# Patient Record
Sex: Male | Born: 1955
Health system: Southern US, Community
[De-identification: ages and names within clinical notes are randomized; demographics above are authoritative.]

## PROBLEM LIST (undated history)

## (undated) DIAGNOSIS — F419 Anxiety disorder, unspecified: Secondary | ICD-10-CM

## (undated) DIAGNOSIS — C801 Malignant (primary) neoplasm, unspecified: Secondary | ICD-10-CM

## (undated) DIAGNOSIS — I509 Heart failure, unspecified: Secondary | ICD-10-CM

## (undated) DIAGNOSIS — B1920 Unspecified viral hepatitis C without hepatic coma: Secondary | ICD-10-CM

## (undated) DIAGNOSIS — N183 Chronic kidney disease, stage 3 unspecified: Secondary | ICD-10-CM

## (undated) DIAGNOSIS — F329 Major depressive disorder, single episode, unspecified: Secondary | ICD-10-CM

## (undated) DIAGNOSIS — E119 Type 2 diabetes mellitus without complications: Secondary | ICD-10-CM

## (undated) DIAGNOSIS — L409 Psoriasis, unspecified: Secondary | ICD-10-CM

## (undated) DIAGNOSIS — F32A Depression, unspecified: Secondary | ICD-10-CM

## (undated) DIAGNOSIS — I2510 Atherosclerotic heart disease of native coronary artery without angina pectoris: Secondary | ICD-10-CM

## (undated) DIAGNOSIS — I10 Essential (primary) hypertension: Secondary | ICD-10-CM

## (undated) DIAGNOSIS — Z944 Liver transplant status: Secondary | ICD-10-CM

## (undated) HISTORY — PX: UPPER GASTROINTESTINAL ENDOSCOPY: SHX188

## (undated) HISTORY — PX: COLONOSCOPY: SHX174

## (undated) HISTORY — DX: Psoriasis, unspecified: L40.9

## (undated) HISTORY — DX: Liver transplant status: Z94.4

## (undated) HISTORY — PX: LIVER BIOPSY: SHX301

## (undated) HISTORY — DX: Essential (primary) hypertension: I10

## (undated) HISTORY — PX: LIVER SURGERY: SHX698

## (undated) HISTORY — DX: Unspecified viral hepatitis C without hepatic coma: B19.20

## (undated) HISTORY — PX: HERNIA REPAIR: SHX51

---

## 2003-08-19 ENCOUNTER — Other Ambulatory Visit: Admission: RE | Admit: 2003-08-19 | Discharge: 2003-08-19 | Payer: Self-pay | Admitting: Dermatology

## 2006-03-07 ENCOUNTER — Ambulatory Visit (HOSPITAL_COMMUNITY): Admission: RE | Admit: 2006-03-07 | Discharge: 2006-03-07 | Payer: Self-pay | Admitting: Internal Medicine

## 2006-03-07 ENCOUNTER — Encounter (INDEPENDENT_AMBULATORY_CARE_PROVIDER_SITE_OTHER): Payer: Self-pay | Admitting: Specialist

## 2006-05-03 ENCOUNTER — Ambulatory Visit: Payer: Self-pay | Admitting: Internal Medicine

## 2006-05-25 ENCOUNTER — Ambulatory Visit: Payer: Self-pay | Admitting: Internal Medicine

## 2006-05-25 ENCOUNTER — Encounter (INDEPENDENT_AMBULATORY_CARE_PROVIDER_SITE_OTHER): Payer: Self-pay | Admitting: Specialist

## 2006-05-25 ENCOUNTER — Ambulatory Visit (HOSPITAL_COMMUNITY): Admission: RE | Admit: 2006-05-25 | Discharge: 2006-05-25 | Payer: Self-pay | Admitting: Internal Medicine

## 2006-07-19 ENCOUNTER — Ambulatory Visit: Payer: Self-pay | Admitting: Internal Medicine

## 2006-10-30 ENCOUNTER — Ambulatory Visit: Payer: Self-pay | Admitting: Internal Medicine

## 2007-06-06 HISTORY — PX: LIVER SURGERY: SHX698

## 2007-10-01 DIAGNOSIS — B182 Chronic viral hepatitis C: Secondary | ICD-10-CM | POA: Insufficient documentation

## 2007-10-01 DIAGNOSIS — L409 Psoriasis, unspecified: Secondary | ICD-10-CM | POA: Insufficient documentation

## 2007-10-29 DIAGNOSIS — I8500 Esophageal varices without bleeding: Secondary | ICD-10-CM | POA: Insufficient documentation

## 2008-01-27 DIAGNOSIS — Z944 Liver transplant status: Secondary | ICD-10-CM | POA: Insufficient documentation

## 2008-07-22 ENCOUNTER — Ambulatory Visit (HOSPITAL_COMMUNITY): Payer: Self-pay | Admitting: Psychology

## 2008-07-28 DIAGNOSIS — F3289 Other specified depressive episodes: Secondary | ICD-10-CM | POA: Insufficient documentation

## 2008-08-05 ENCOUNTER — Ambulatory Visit (HOSPITAL_COMMUNITY): Payer: Self-pay | Admitting: Psychology

## 2010-10-18 NOTE — Assessment & Plan Note (Signed)
NAME:  Joseph Greer, Joseph Greer           CHART#:  [id number]   DATE:  10/30/2006                       DOB:  Dec 14, 1955   PRESENTING COMPLAINT:  Extreme weakness, fatigue.  Lower extremity  edema.   SUBJECTIVE:  Joseph Greer is a 55 year old Caucasian male with a history of  cirrhosis secondary to hepatitis C who is here for scheduled visit.  Antiviral therapy has been deferred because of severe psoriasis and risk  of getting a skin disorder.  He has been evaluated at [facility] as  a potential candidate for OLT.  Presently getting periodic alcohol and  drug levels.  He also had an MRI, results of which are not known.  Patient states that he has not been feeling well for the last 1 month.  LE edema started about a year ago, and he has been having this off and  on.  He recalls he was admitted to [facility] in December 2008.  On his visit  to our office back in November 2007 he did not have lower extremity  edema, and he weighed 190 pounds.  More recently he was seen on  07/19/2006, and he weighed 194 pounds, and he had a macular papular rash  involving both of his lower extremities.  He did not have any pitting  edema.  He has been on a low-salt diet.  His wife states that she does  not put much salt in food while she is cooking.  He also complains of  feeling tired all the time and feels very weak.  He gets tired when he  starts to cut his grass.  Previously, he was able to accomplish this  without having to stop.  He denies shortness of breath, chest pain.  He  states his appetite is normal, but does have intermittent nonbloody  diarrhea usually after meals.  He denies vomiting or melena.  Over the  last 2 days he has had sore throat and runny nose but no fever.  He has  been on pen VK.  Prior to that he was on cephalexin.   He is on:  1. Enbrel 50 mg q. weekly.  He decided to skip the dose this week      because of his URI.  2. Propranolol 20 mg daily.  3. Ambien 5 mg nightly p.r.n. or  hydroxyzine 25 mg nightly p.r.n.  4. Betazole cream 0.05% to skin rash daily p.r.n.  5. Lotrel 10/20 daily.  6. Dovonex 0.005% cream b.i.d. to rash.  7. Pen VK 500 mg q.i.d.  8. Astelin nasal spray daily.   PAST MEDICAL HISTORY:  He has hypertension, psoriasis, chronic insomnia,  hepatitis C resulting in cirrhosis yet he has not had any alcohol in  over 6 months.  He also has a history of a herniated disk in his neck  with some neuro deficit in his fingers.  He is on a beta blocker for  primary prophylaxis for a variceal bleed.  He had EGD done last year by  Dr. Lennie Odor.  A history of colonic polyps.  His last colonoscopy was in  December 2007.  Both of the polyps that were removed were hyperplastic,  but on prior incomplete colonoscopy by Dr. Lennie Odor he had some adenomas  removed.   He has genotype I chronic hepatitis B.  An EGD of 03/2006 revealed 2  columns  of grade II esophageal varices and mild portal gastropathy.   The patient has been evaluated by Dr. Eddie Dibbles __________ at [facility] and is  getting a periodic blood work as recommended by them.   His liver biopsy was in October 2007.  He has inflammatory grade II and  fibrosis stage IV.   ALLERGIES:  NK.   OBJECTIVE:  VITAL SIGNS:  Weight 181 pounds.  He is 6 feet tall.  Pulse  80 per minute, blood pressure 118/78, temp is 98.6.  GENERAL:  He is alert and does not have asterixis.  HEENT:  Conjunctivae are pink.  Sclerae are anicteric.  Oropharyngeal  mucosa is normal.  NECK:  No neck masses, thyromegaly or spider angiomata noted.  CARDIAC:  Regular rhythm.  Normal S1 and S2.  No murmur or gallop noted.  RESPIRATORY:  Lungs are clear to auscultation.  GASTROINTESTINAL:  His abdomen is symmetrical and soft.  Liver edges  easily palpable 3 cm to 4 cm below RCM, and inspiration is firm and  minimally tender.  Spleen tip is also palpable.  Shifting dullness is  absent.  SKIN/EXTREMITIES:  He has trace to 1+ pitting edema involving his  legs.  He has a macular papular scaly rash involving his hands as well as upper  extremities.   ASSESSMENT:  1. Lower extremity edema.  This is concerning for hypoalbuminemia      given his chronic liver disease.  Clinically, he does not appear to      have ascites.  His albumin in February 2008 was 2.6.  Needs to be      repeated.  2. Weakness and fatigue.  I suspect this is multifactorial, re liver      disease, beta blocker.  Need to rule out anemia, hypokalemia.   PLAN:  1. Will hold off propranolol for the time being.  2. He will have CBC and chem-20.  3. Spironolactone 50 mg daily.  4. Lasix 20 mg 3 times a week as needed.  He had a prescription given      for spironolactone for 30 with 5 refills and for Lasix 20 with 2      refills.  5. Patient advised to continue periodic blood work as recommended by      Dr. __________ of [facility].  6. He will return for OV in 8 weeks.       Hildred Laser, M.D.  Electronically Signed     NR/MEDQ  D:  10/30/2006  T:  10/31/2006  Job:  [id number]

## 2010-10-21 NOTE — Op Note (Signed)
NAMECUTHBERT, Joseph Greer          ACCOUNT NO.:  [id number]   MEDICAL RECORD NO.:  [id number]          PATIENT TYPE:  AMB   LOCATION:  DAY                           FACILITY:  [facility]   PHYSICIAN:  Hildred Laser, M.D.    DATE OF BIRTH:  1956-01-21   DATE OF PROCEDURE:  05/25/2006  DATE OF DISCHARGE:                               OPERATIVE REPORT   PROCEDURE:  Colonoscopy with polypectomy.   INDICATION:  Joseph Greer is a 55 year old Caucasian male with multiple medical  problems who has had two colonoscopies [place] which were incomplete but he  had some adenomas removed.  He is undergoing repeat colonoscopy hoping  to do a complete exam today and remove any more polyps that he might  have.  He has biopsy proven hepatitis C cirrhosis.  He has  thrombocytopenia, his INR was 1.2.  The procedure risks were reviewed  with the patient and informed consent was obtained.   MEDS FOR CONSCIOUS SEDATION:  Demerol 50 mg IV, Versed 10 mg IV in  divided dose.   FINDINGS:  The procedure was performed in the endoscopy suite.  The  patient's vital signs and O2 sat were monitored during the procedure and  remained stable.  The patient was placed in the left lateral decubitus  position.  Rectal examination was performed.  No abnormality noted on  external or digital exam.  The Pentax videoscope was placed in the  rectum and advanced under vision into the sigmoid colon which was  tortuous and loopy.  By using abdominal pressure, repeatedly withdrawing  the scope, and changing patient's position, I was able to pass the scope  beyond splenic flexure and further intubation into the cecum was rather  easy.  The cecum was identified by the ileocecal valve.  There was a 10  mm polyp next to the appendiceal orifice.  This was snared and  polypectomy was felt to be complete.  This polyp broke up into pieces  that was retrieved.  The mucosa of the rest of the colon was carefully  examined as the scope was withdrawn.   There was a small submucosal  lipoma at the proximal transverse colon which was left alone.  There  were two polyps at the mid sigmoid colon very close to each other, the  smaller one was about 5 mm and the other one was 8 mm, both of these  were snared and submitted in one container.  The mucosa of the rest of  the colon was normal.  The rectal mucosa, similarly, was normal.  The  scope was retroflexed to examine the anorectal junction which was  unremarkable.  The endoscope was straightened and withdrawn.  The  patient tolerated the procedure well.   FINAL DIAGNOSIS:  1. Examination performed to the cecum.  2. 10 mm polyp snared from cecum close to the appendiceal orifice.  3. Two other polyps were snared from the sigmoid colon.  These were 5      and 8 mm in diameter.   RECOMMENDATIONS:  Standard instructions given.  I will be contacting the  patient with biopsy results.  He will  return after the holidays to  initiate antiviral therapy for hepatitis C.      Hildred Laser, M.D.  Electronically Signed     NR/MEDQ  D:  05/25/2006  T:  05/26/2006  Job:  [id number]   cc:   Delphina Cahill, M.D.  Fax: [phone]

## 2010-10-21 NOTE — H&P (Signed)
NAME:  Joseph Greer, Joseph Greer          ACCOUNT NO.:  [id number]   MEDICAL RECORD NO.:  [id number]            PATIENT TYPE:   LOCATION:                                 FACILITY:   PHYSICIAN:  Hildred Laser, M.D.    DATE OF BIRTH:  02/24/1936   DATE OF ADMISSION:  DATE OF DISCHARGE:  LH                              HISTORY & PHYSICAL   PRESENTING COMPLAINT:  Multiple polyps found on recent colonoscopy which  was incomplete.  Biopsy-proven hep C cirrhosis.  Patient interested in  antiviral therapy.   HISTORY OF PRESENT ILLNESS:  Joseph Greer is a 55 year old Caucasian male who is  self-referred for GI evaluation.  He has been under care of Dr. Leonard Schwartz of [place], [place], who is leaving next month, and the  patient wanted to continue his GI care through this office.  As far as  the patient's hep C is concerned, he has been aware that his  transaminases have been elevated since he was 55 years old.  He was  diagnosed with hepatitis 13 or 14 years ago.  However, he has not been  fully evaluated until recently.  He had studies by Dr. Lavone Neri.  His  genotype 1b count was 6.799 million.  He has had thrombocytopenia as  well as low serum albumin.  He had liver biopsy on April 03, 2006 at  [facility].  He was noted to have chronic hepatitis with cirrhosis with mild  activity inflammatory grade 2, but fibrosis was stage IV.  He had  intralobular and perinodular inflammation, micro- and macrovesicular  steatosis.  There was no evidence of increased iron stores.  He also had  esophagogastroduodenoscopy by Dr. Lavone Neri on March 15, 2006 and noted to  have two columns of grade II esophageal varices, portal gastropathy.  His CLO-test was negative, and he was begun on propranolol for primary  prevention of variceal bleeds.   The patient was also found to have heme-positive stools and therefore  underwent colonoscopy.  However, his prep was poor, and he returned for  follow-up exam on April 10, 2006.  It  was a difficult exam.  Five  polyps were removed.  However, proximal colon was not examined.  He had  to be temporarily kept in ICU for over sedation but had uneventful  recovery.  Follow-up colonoscopy was recommended at some point.  The  patient denies melena, rectal bleeding or abdominal pain.  He states his  appetite is not normal, but he has not lost any weight.  He is  complaining of worsening rash and itching secondary to psoriasis since  his Amaryl was discontinued.  The patient was advised by Dr. Lavone Neri to  quit drinking alcohol, but he tells me that he drinks a few beers over  the weekend.  He denies heartburn, dysphagia, fever or chills.   He is on:  1. Enbrel 50 mg subcu or IM weekly.  2. Ultravate cream to rash b.i.d. p.r.n.  3. Propranolol 20 mg daily.  4. Valium 5 mg every night.  5. Ambien 5 mg every night p.r.n.  6. Mucinex b.i.d.  PAST MEDICAL HISTORY:  He has psoriasis which was diagnosed about 12  years ago.  His been quite responsive to Enbrel which has been  temporarily discontinued.  History of hep C as above.  Hepatitis C is  felt to be due to remote IV drug abuse.   He has been hypertensive for about a year.  He has chronic insomnia.  He  has neck pain secondary to herniated disk.  He has been under care of a  chiropractor, but he is scheduled to be seen by a neurosurgeon.  History  of colonic polyps as above.  History of microscopic hematuria to be  evaluated by urologist.  He had tonsillectomy at age 31.   ALLERGIES:  NK.   FAMILY HISTORY:  Both parents have heart disease.  There are 50 years  old and doing fair.  He does not have any siblings.   SOCIAL HISTORY:  He is married.  He has one son, age 77.  His wife is  also having back problems.  He worked at [employer] in [place] for [place] years but  presently working at [employer] where he has been for about 4  years.  He has been smoking half pack a day for 30 years.  He states he  drinks alcohol  usually over the weekends.   PHYSICAL EXAMINATION:  Well-developed, well-nourished Caucasian male who  is in no acute distress.  He weighs 190 pounds.  He is 6 feet tall.  Pulse 68 per minute, blood pressure 152/80, temperature is 98.5.  HEENT:  Conjunctivae is pink.  Sclera is nonicteric.  Oral pharyngeal  mucosa is normal.  No neck masses or thyromegaly noted.  He had a few  spider angiomata over his chest and neck.  CARDIAC EXAM:  With regular rhythm.  Normal S1-S2.  No murmur or gallop  noted.  LUNGS:  Clear to auscultation.  ABDOMEN:  Is flat.  Bowel sounds are normal.  Palpation is soft with  very easily palpable left lobe of the liver which is firm.  Right lobe  is 4 cm below RCM at end inspiration.  Spleen is not palpable.  He does  not have shifting dullness.  RECTAL:  Examination deferred.  He does not have peripheral edema or clubbing.   LABORATORY DATA:  From Dr. Rudene Re office February 19, 2006:  PT 14.1,  INR 1.2.  Iron 202, TIBC 298, saturation 68%.  PTT was 37.6.  Bleeding  time was 11 minutes.  His ferritin was 452.  He had positive hep C  genotype 1b at 6.7 million units.  CBC from April 10, 2006:  WBC 4.7,  H&H is 12.7 and 39.5, platelet count 65, MCV is 98.8.  Bilirubin 1.4, AP  80, AST 80, ALT 50, total protein 6.8 with albumin of 2.4, BUN 5,  creatinine 0.7, glucose 151, sodium 139, potassium 4.1, chloride 104,  CO2 31, and his INR was 1.3, PTT 43.7.  Urinalysis had too numerous to  count WBCs and 10 to 20 RBCs.   Histology on the colonic polyps not available.   ASSESSMENT:  Joseph Greer is 55 year old Caucasian male with the following GI  problems.  1. Chronic hep C with biopsy-proven cirrhosis.  He has stage 2      inflammatory grade and stage IV cirrhosis.  Although no Mallory      bodies were seen, he did have macro- and microvesicular fats.  His     AST has been repeatedly higher than his  ALT.  I suspect he has      super-added hepatic injury due to alcohol  consumption.  I am afraid      that he is not telling me his actual alcohol consumption.  If this      is the case, his hepatic function should improve with alcohol      abstinence, and he may be able to tolerate full-dose antiviral      therapy.  Since he has evidence of portal hypertension, i.e.      esophageal varices, he may eventually require liver transplant.  It      would be desirable to eradicate the infection if at all possible.      His chance of response to therapy if he is able to tolerate it      would be less than 50%.  2. Colonic polyps and recent colonoscopy was which was incomplete.  I      agree his colon exam needs to be repeated, and significant      pathology needs to be ruled out prior to initiating antiviral      therapy.   RECOMMENDATIONS:  His Enbrel can be resumed for his psoriasis.   We will get all of the pertinent data from [facility] including the results of  imaging studies, etc.   He will need to be vaccinated for hepatitis B unless he has a positive  hepatitis B surface antibody.   The patient advised very strongly to quit drinking alcohol.  I told him  if he was not able to do that I would not treat his viral hepatitis.   We will plan a colonoscopy as soon as feasible and then discuss  antiviral therapy in detail, and if he is agreeable, hopefully we can  get him started next month.   I also recommended that his wife be tested for HCV antibody.  Finally,  he would also need alpha fetoprotein unless he has had this test via  [facility].      Hildred Laser, M.D.  Electronically Signed     NR/MEDQ  D:  05/03/2006  T:  05/04/2006  Job:  [id number]   cc:   Leonard Schwartz, M.D.  Cain Sieve, M.D.  Fax: [facility]  Fax: [phone]

## 2010-11-07 ENCOUNTER — Ambulatory Visit (INDEPENDENT_AMBULATORY_CARE_PROVIDER_SITE_OTHER): Payer: Self-pay | Admitting: Internal Medicine

## 2010-11-28 ENCOUNTER — Ambulatory Visit (INDEPENDENT_AMBULATORY_CARE_PROVIDER_SITE_OTHER): Payer: Self-pay | Admitting: Internal Medicine

## 2010-11-28 DIAGNOSIS — Z860100 Personal history of colon polyps, unspecified: Secondary | ICD-10-CM

## 2010-11-28 DIAGNOSIS — R5383 Other fatigue: Secondary | ICD-10-CM

## 2010-11-28 DIAGNOSIS — R5381 Other malaise: Secondary | ICD-10-CM

## 2010-11-28 DIAGNOSIS — Z944 Liver transplant status: Secondary | ICD-10-CM

## 2011-03-16 ENCOUNTER — Telehealth (INDEPENDENT_AMBULATORY_CARE_PROVIDER_SITE_OTHER): Payer: Self-pay | Admitting: *Deleted

## 2011-03-16 DIAGNOSIS — E875 Hyperkalemia: Secondary | ICD-10-CM

## 2011-03-16 NOTE — Telephone Encounter (Signed)
Per Dr. Laural Golden  Repeat Potassium in 1 week ,( 03-23-11).

## 2011-08-03 ENCOUNTER — Encounter (INDEPENDENT_AMBULATORY_CARE_PROVIDER_SITE_OTHER): Payer: Self-pay | Admitting: *Deleted

## 2011-09-04 ENCOUNTER — Encounter (INDEPENDENT_AMBULATORY_CARE_PROVIDER_SITE_OTHER): Payer: Self-pay | Admitting: Internal Medicine

## 2011-09-04 ENCOUNTER — Ambulatory Visit (INDEPENDENT_AMBULATORY_CARE_PROVIDER_SITE_OTHER): Payer: Medicare Other | Admitting: Internal Medicine

## 2011-09-04 VITALS — BP 138/80 | HR 84 | Temp 97.6°F | Resp 20 | Ht 72.0 in | Wt 202.7 lb

## 2011-09-04 DIAGNOSIS — L409 Psoriasis, unspecified: Secondary | ICD-10-CM | POA: Insufficient documentation

## 2011-09-04 DIAGNOSIS — R42 Dizziness and giddiness: Secondary | ICD-10-CM | POA: Insufficient documentation

## 2011-09-04 DIAGNOSIS — Z944 Liver transplant status: Secondary | ICD-10-CM
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
## 2011-09-04 NOTE — Patient Instructions (Signed)
No changes made in your medications today
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
## 2011-10-03 DIAGNOSIS — H81399 Other peripheral vertigo, unspecified ear: Secondary | ICD-10-CM | POA: Insufficient documentation

## 2011-11-02 ENCOUNTER — Encounter (INDEPENDENT_AMBULATORY_CARE_PROVIDER_SITE_OTHER): Payer: Self-pay

## 2012-01-24 DIAGNOSIS — E119 Type 2 diabetes mellitus without complications: Secondary | ICD-10-CM | POA: Insufficient documentation

## 2012-01-24 DIAGNOSIS — I10 Essential (primary) hypertension: Secondary | ICD-10-CM | POA: Diagnosis present

## 2012-01-24 DIAGNOSIS — C44320 Squamous cell carcinoma of skin of unspecified parts of face: Secondary | ICD-10-CM | POA: Insufficient documentation

## 2012-01-30 ENCOUNTER — Encounter (INDEPENDENT_AMBULATORY_CARE_PROVIDER_SITE_OTHER): Payer: Self-pay

## 2012-03-05 ENCOUNTER — Ambulatory Visit (INDEPENDENT_AMBULATORY_CARE_PROVIDER_SITE_OTHER): Payer: Medicare Other | Admitting: Internal Medicine

## 2012-04-17 DIAGNOSIS — Z9889 Other specified postprocedural states: Secondary | ICD-10-CM | POA: Insufficient documentation

## 2012-08-05 DIAGNOSIS — R5383 Other fatigue: Secondary | ICD-10-CM | POA: Insufficient documentation

## 2013-03-26 ENCOUNTER — Observation Stay (HOSPITAL_COMMUNITY)
Admission: EM | Admit: 2013-03-26 | Discharge: 2013-03-27 | Disposition: A | Discharge status: Home / Self Care | Payer: Medicare Other | Attending: Internal Medicine | Admitting: Internal Medicine

## 2013-03-26 ENCOUNTER — Encounter (HOSPITAL_COMMUNITY): Payer: Self-pay | Admitting: *Deleted

## 2013-03-26 ENCOUNTER — Emergency Department (HOSPITAL_COMMUNITY): Payer: Medicare Other

## 2013-03-26 DIAGNOSIS — I10 Essential (primary) hypertension: Secondary | ICD-10-CM | POA: Diagnosis present

## 2013-03-26 DIAGNOSIS — L408 Other psoriasis: Secondary | ICD-10-CM | POA: Insufficient documentation

## 2013-03-26 DIAGNOSIS — R0789 Other chest pain: Principal | ICD-10-CM | POA: Insufficient documentation

## 2013-03-26 DIAGNOSIS — Z9889 Other specified postprocedural states: Secondary | ICD-10-CM | POA: Insufficient documentation

## 2013-03-26 DIAGNOSIS — R079 Chest pain, unspecified: Secondary | ICD-10-CM | POA: Diagnosis present

## 2013-03-26 DIAGNOSIS — R42 Dizziness and giddiness: Secondary | ICD-10-CM | POA: Insufficient documentation

## 2013-03-26 DIAGNOSIS — Z79899 Other long term (current) drug therapy: Secondary | ICD-10-CM | POA: Insufficient documentation

## 2013-03-26 DIAGNOSIS — Z944 Liver transplant status: Secondary | ICD-10-CM

## 2013-03-26 DIAGNOSIS — Z23 Encounter for immunization: Secondary | ICD-10-CM | POA: Insufficient documentation

## 2013-03-26 DIAGNOSIS — Z87891 Personal history of nicotine dependence: Secondary | ICD-10-CM | POA: Insufficient documentation

## 2013-03-26 DIAGNOSIS — L409 Psoriasis, unspecified: Secondary | ICD-10-CM

## 2013-03-26 LAB — CBC WITH DIFFERENTIAL/PLATELET
Eosinophils Relative: 4 % (ref 0–5)
HCT: 39 % (ref 39.0–52.0)
Hemoglobin: 13.3 g/dL (ref 13.0–17.0)
Lymphocytes Relative: 27 % (ref 12–46)
Lymphs Abs: 1.6 10*3/uL (ref 0.7–4.0)
MCV: 82.3 fL (ref 78.0–100.0)
Monocytes Absolute: 0.5 10*3/uL (ref 0.1–1.0)
Monocytes Relative: 8 % (ref 3–12)
Neutro Abs: 3.5 10*3/uL (ref 1.7–7.7)
WBC: 5.9 10*3/uL (ref 4.0–10.5)

## 2013-03-26 LAB — COMPREHENSIVE METABOLIC PANEL
ALT: 51 U/L (ref 0–53)
ALT: 47 U/L (ref 0–53)
AST: 92 U/L — ABNORMAL HIGH (ref 0–37)
AST: 81 U/L — ABNORMAL HIGH (ref 0–37)
Alkaline Phosphatase: 140 U/L — ABNORMAL HIGH (ref 39–117)
CO2: 26 mEq/L (ref 19–32)
CO2: 28 mEq/L (ref 19–32)
Chloride: 106 mEq/L (ref 96–112)
Chloride: 106 mEq/L (ref 96–112)
Creatinine, Ser: 1.04 mg/dL (ref 0.50–1.35)
GFR calc Af Amer: >90 mL/min (ref >90)
GFR calc non Af Amer: 78 mL/min — ABNORMAL LOW (ref >90)
GFR calc non Af Amer: 78 mL/min — ABNORMAL LOW (ref >90)
Glucose, Bld: 109 mg/dL — ABNORMAL HIGH (ref 70–99)
Potassium: 4.3 mEq/L (ref 3.5–5.1)
Potassium: 4.1 mEq/L (ref 3.5–5.1)
Sodium: 140 mEq/L (ref 135–145)
Total Bilirubin: 0.6 mg/dL (ref 0.3–1.2)
Total Bilirubin: 0.5 mg/dL (ref 0.3–1.2)

## 2013-03-26 LAB — CBC
Hemoglobin: 13.3 g/dL (ref 13.0–17.0)
Platelets: 204 10*3/uL (ref 150–400)
RBC: 4.73 MIL/uL (ref 4.22–5.81)
WBC: 6.9 10*3/uL (ref 4.0–10.5)

## 2013-03-26 LAB — MAGNESIUM: Magnesium: 1.8 mg/dL (ref 1.5–2.5)

## 2013-03-26 LAB — LIPID PANEL
Cholesterol: 183 mg/dL (ref 0–200)
LDL Cholesterol: 72 mg/dL (ref 0–99)
Triglycerides: 373 mg/dL — ABNORMAL HIGH (ref <150)

## 2013-03-26 LAB — PROTIME-INR: INR: 0.94 (ref 0.00–1.49)

## 2013-03-26 LAB — PRO B NATRIURETIC PEPTIDE: Pro B Natriuretic peptide (BNP): 189.6 pg/mL — ABNORMAL HIGH (ref 0–125)

## 2013-03-26 LAB — POCT I-STAT TROPONIN I

## 2013-03-26 MED ORDER — NITROGLYCERIN 0.4 MG SL SUBL: 0.4000 mg | SUBLINGUAL_TABLET | SUBLINGUAL | Status: DC | PRN

## 2013-03-26 MED ORDER — BUPROPION HCL 75 MG PO TABS
75.0000 mg | ORAL_TABLET | Freq: Two times a day (BID) | ORAL | Status: DC
  Administered 2013-03-26 – 2013-03-27 (×2): 75 mg via ORAL
  Filled 2013-03-26 (×3): qty 1

## 2013-03-26 MED ORDER — ENOXAPARIN SODIUM 40 MG/0.4ML ~~LOC~~ SOLN
40.0000 mg | SUBCUTANEOUS | Status: DC
  Filled 2013-03-26 (×2): qty 0.4

## 2013-03-26 MED ORDER — CALCIUM CARBONATE 600 MG PO TABS
600.0000 mg | ORAL_TABLET | Freq: Two times a day (BID) | ORAL | Status: DC
  Filled 2013-03-26: qty 1

## 2013-03-26 MED ORDER — ZOLPIDEM TARTRATE 5 MG PO TABS
5.0000 mg | ORAL_TABLET | Freq: Once | ORAL | Status: AC
  Administered 2013-03-26: 5 mg via ORAL
  Filled 2013-03-26: qty 1

## 2013-03-26 MED ORDER — CALCIUM CARBONATE 1250 (500 CA) MG PO TABS
1.0000 | ORAL_TABLET | Freq: Two times a day (BID) | ORAL | Status: DC
  Administered 2013-03-27: 500 mg via ORAL
  Filled 2013-03-26 (×3): qty 1

## 2013-03-26 MED ORDER — ASPIRIN EC 81 MG PO TBEC
81.0000 mg | DELAYED_RELEASE_TABLET | Freq: Every day | ORAL | Status: DC
  Administered 2013-03-27: 81 mg via ORAL
  Filled 2013-03-26: qty 1

## 2013-03-26 MED ORDER — TACROLIMUS 1 MG PO CAPS: 1.0000 mg | ORAL_CAPSULE | Freq: Two times a day (BID) | ORAL | Status: DC

## 2013-03-26 MED ORDER — FLUOXETINE HCL 20 MG PO CAPS
20.0000 mg | ORAL_CAPSULE | Freq: Every day | ORAL | Status: DC
  Administered 2013-03-27: 20 mg via ORAL
  Filled 2013-03-26: qty 1

## 2013-03-26 MED ORDER — IBUPROFEN 800 MG PO TABS
800.0000 mg | ORAL_TABLET | Freq: Four times a day (QID) | ORAL | Status: DC | PRN
Start: 2013-03-26 — End: 2013-03-26

## 2013-03-26 MED ORDER — ACETAMINOPHEN 325 MG PO TABS: 650.0000 mg | ORAL_TABLET | ORAL | Status: DC | PRN

## 2013-03-26 MED ORDER — ONDANSETRON HCL 4 MG PO TABS: 8.0000 mg | ORAL_TABLET | ORAL | Status: DC | PRN

## 2013-03-26 MED ORDER — MYCOPHENOLATE MOFETIL 250 MG PO CAPS
500.0000 mg | ORAL_CAPSULE | Freq: Two times a day (BID) | ORAL | Status: DC
  Administered 2013-03-26 – 2013-03-27 (×2): 500 mg via ORAL
  Filled 2013-03-26 (×3): qty 2

## 2013-03-26 MED ORDER — TACROLIMUS 1 MG PO CAPS
2.0000 mg | ORAL_CAPSULE | Freq: Every day | ORAL | Status: DC
  Administered 2013-03-27: 2 mg via ORAL
  Filled 2013-03-26: qty 2

## 2013-03-26 MED ORDER — ASPIRIN 81 MG PO CHEW: 324.0000 mg | CHEWABLE_TABLET | Freq: Once | ORAL | Status: AC

## 2013-03-26 MED ORDER — METOPROLOL TARTRATE 12.5 MG HALF TABLET
12.5000 mg | ORAL_TABLET | Freq: Two times a day (BID) | ORAL | Status: DC
  Administered 2013-03-26 – 2013-03-27 (×2): 12.5 mg via ORAL
  Filled 2013-03-26 (×3): qty 1

## 2013-03-26 MED ORDER — LISINOPRIL 10 MG PO TABS
10.0000 mg | ORAL_TABLET | Freq: Every day | ORAL | Status: DC
  Administered 2013-03-27: 10 mg via ORAL
  Filled 2013-03-26: qty 1

## 2013-03-26 MED ORDER — INFLUENZA VAC SPLIT QUAD 0.5 ML IM SUSP
0.5000 mL | INTRAMUSCULAR | Status: AC
  Administered 2013-03-27: 0.5 mL via INTRAMUSCULAR
  Filled 2013-03-26: qty 0.5

## 2013-03-26 MED ORDER — MYCOPHENOLATE MOFETIL 250 MG PO CAPS: 250.0000 mg | ORAL_CAPSULE | Freq: Two times a day (BID) | ORAL | Status: DC

## 2013-03-26 MED ORDER — MAGNESIUM OXIDE 400 (241.3 MG) MG PO TABS
400.0000 mg | ORAL_TABLET | Freq: Every day | ORAL | Status: DC
  Administered 2013-03-27: 400 mg via ORAL
  Filled 2013-03-26: qty 1

## 2013-03-26 MED ORDER — TACROLIMUS 1 MG PO CAPS
1.0000 mg | ORAL_CAPSULE | Freq: Every day | ORAL | Status: DC
  Administered 2013-03-26: 1 mg via ORAL
  Filled 2013-03-26 (×2): qty 1

## 2013-03-26 MED ORDER — ONDANSETRON HCL 4 MG/2ML IJ SOLN: 4.0000 mg | Freq: Four times a day (QID) | INTRAMUSCULAR | Status: DC | PRN

## 2013-03-26 MED ORDER — ASPIRIN 300 MG RE SUPP
300.0000 mg | RECTAL | Status: DC
  Filled 2013-03-26: qty 1

## 2013-03-26 MED ORDER — MAGNESIUM 500 MG PO CAPS: 500.0000 mg | ORAL_CAPSULE | Freq: Every day | ORAL | Status: DC

## 2013-03-26 MED ORDER — ASPIRIN 81 MG PO CHEW: 324.0000 mg | CHEWABLE_TABLET | ORAL | Status: DC

## 2013-03-26 NOTE — ED Provider Notes (Signed)
CSN: [id number]     Arrival date & time 03/26/13  1443 History   First MD Initiated Contact with Patient 03/26/13 1444     Chief Complaint  Patient presents with  . Chest Pain   (Consider location/radiation/quality/duration/timing/severity/associated sxs/prior Treatment) HPI  Patient presents with concerns chest pain.  Patient's pain began without clear precipitant within the past 6 hours. Initially the pain was severe, sternal, nonradiating. There is mild associated dyspnea. Pain improved with nitroglycerin. There is no lightheadedness, no syncope, no vomiting, no diarrhea.  Patient did endorse nausea. Pain assessment pleuritic or exertional.  Per EMS, the patient had substantial pain on their arrival, but this improved following provision of nitroglycerin. Past Medical History  Diagnosis Date  . Status post liver transplant   . Psoriasis   . Hypertension    Past Surgical History  Procedure Laterality Date  . Liver surgery    . Colonoscopy    . Upper gastrointestinal endoscopy    . Liver biopsy    . Hernia repair     Family History  Problem Relation Age of Onset  . Diabetes Mother   . Heart disease Father   . Heart disease Son    History  Substance Use Topics  . Smoking status: Former Smoker -- 1.00 packs/day    Types: Cigarettes    Quit date: 09/04/2007  . Smokeless tobacco: Never Used  . Alcohol Use: No    Review of Systems  Constitutional:       Per HPI, otherwise negative  HENT:       Per HPI, otherwise negative  Respiratory:       Per HPI, otherwise negative  Cardiovascular:       Per HPI, otherwise negative  Gastrointestinal: Negative for vomiting.  Endocrine:       Negative aside from HPI  Genitourinary:       Neg aside from HPI   Musculoskeletal:       Per HPI, otherwise negative  Skin: Negative.   Neurological: Negative for syncope.    Allergies  Review of patient's allergies indicates no known allergies.  Home Medications   Current  Outpatient Rx  Name  Route  Sig  Dispense  Refill  . amphetamine-dextroamphetamine (ADDERALL, 10MG ,) 10 MG tablet   Oral   Take 10 mg by mouth as needed.         Marland Kitchen buPROPion (WELLBUTRIN) 75 MG tablet   Oral   Take 75 mg by mouth 2 (two) times daily.          . calcium carbonate (OS-CAL) 600 MG TABS   Oral   Take 600 mg by mouth 2 (two) times daily with a meal.         . FLUoxetine (PROZAC) 20 MG tablet   Oral   Take 20 mg by mouth daily.         Marland Kitchen ibuprofen (ADVIL,MOTRIN) 800 MG tablet   Oral   Take 800 mg by mouth every 6 (six) hours as needed for pain.         Marland Kitchen lisinopril (PRINIVIL,ZESTRIL) 10 MG tablet   Oral   Take 10 mg by mouth daily.         . Magnesium 500 MG CAPS   Oral   Take 500 mg by mouth daily.          . MULTIPLE VITAMINS PO   Oral   Take by mouth daily.         . mycophenolate (CELLCEPT)  250 MG capsule   Oral   Take 250-500 mg by mouth 2 (two) times daily. Patient takes 500 mg in the morning , 250 mg in the evening         . ondansetron (ZOFRAN) 8 MG tablet   Oral   Take 8 mg by mouth as needed.          . tacrolimus (PROGRAF) 1 MG capsule   Oral   Take 1-2 mg by mouth 2 (two) times daily. Patient takes 2 mg in the morning , 1 mg at night         . zolpidem (AMBIEN) 5 MG tablet   Oral   Take 5 mg by mouth at bedtime as needed.          BP 131/82  Pulse 59  Temp(Src) 97.9 F (36.6 C) (Oral)  Resp 12  SpO2 98% Physical Exam  Nursing note and vitals reviewed. Constitutional: He is oriented to person, place, and time. He appears well-developed. No distress.  HENT:  Head: Normocephalic and atraumatic.  Eyes: Conjunctivae and EOM are normal.  Cardiovascular: Normal rate and regular rhythm.   Pulmonary/Chest: Effort normal. No stridor. No respiratory distress.  Abdominal: He exhibits no distension.  Musculoskeletal: He exhibits no edema.  Neurological: He is alert and oriented to person, place, and time.  Skin: Skin  is warm and dry.  Psychiatric: He has a normal mood and affect.    ED Course  Procedures (including critical care time) Labs Review Labs Reviewed  PRO B NATRIURETIC PEPTIDE - Abnormal; Notable for the following:    Pro B Natriuretic peptide (BNP) 192.2 (*)    All other components within normal limits  CBC - Abnormal; Notable for the following:    HCT 38.9 (*)    All other components within normal limits  COMPREHENSIVE METABOLIC PANEL - Abnormal; Notable for the following:    Calcium 8.2 (*)    Albumin 3.1 (*)    AST 92 (*)    Alkaline Phosphatase 140 (*)    GFR calc non Af Amer 78 (*)    All other components within normal limits  PROTIME-INR  POCT I-STAT TROPONIN I   Imaging Review Dg Chest 2 View  03/26/2013   CLINICAL DATA:  Chest pain and shortness of breath.  EXAM: CHEST  2 VIEW  COMPARISON:  Plain film of the chest 01/06/2008.  FINDINGS: Heart size and mediastinal contours are within normal limits. Both lungs are clear. Visualized skeletal structures are unremarkable.  IMPRESSION: Negative exam.   Electronically Signed   By: Inge Rise M.D.   On: 03/26/2013 15:20    EKG Interpretation     Ventricular Rate:  61 PR Interval:  162 QRS Duration: 101 QT Interval:  383 QTC Calculation: 386 R Axis:   72 Text Interpretation:  Age not entered, assumed to be  57 years old for purpose of ECG interpretation Sinus rhythm Nonspecific T abnrm, anterolateral leads Abnormal ekg           cardiac monitor 60 sinus rhythm normal Pulse oximetry 100% in the, normal  Prior to the initial evaluation I discussed the patient's presentation with our EMS providers.  Reviewed his chart from urgent care, where he initially presented secondary to the pain.   4:52 PM Patient is CP free MDM  No diagnosis found. This patient with multiple medical problems, though no diagnosis history of CAD now presents after an episode of chest pain.  Pain resolved following nitroglycerin.  However, he did have T-wave inversions.  Given the patient's familial risk profile, his abnormal EKG, he was admitted for further evaluation and management.    Carmin Muskrat, MD 03/26/13 [phone]

## 2013-03-26 NOTE — ED Notes (Signed)
MD at bedside. 

## 2013-03-26 NOTE — ED Notes (Signed)
Per EMS: Pt went to [facility] this AM for non radiating epigastric CP x 3 hours. Reports nausea, denies dizziness, SOB, diaphoresis.T waves abnomality noted on EKG. Given 324 aspirin, 1 nitro relieving pain from 8/10 to 0. AO x4. 148/74. Hx: liver transplant BG 87. 60 NSR.

## 2013-03-26 NOTE — ED Notes (Signed)
Pt reports waking this morning "just not feeling very well." States that while sitting in his car experienced mid epigastric "squeezing" pain, non radiating with mild SOB and nausea. Relieved by nitro. Denies pain at this time. VSS.

## 2013-03-26 NOTE — ED Notes (Signed)
Pt belongings placed in belongings bag at bedside.

## 2013-03-26 NOTE — H&P (Signed)
[facility] History and Physical  KHALEEL FINGERHUT [id number] DOB: 09/22/1955 DOA: 03/26/2013  Referring physician:  PCP: Monico Blitz, MD  Specialists:   Chief Complaint: Chest pain  HPI: Joseph Greer is a 57 y.o. WM  PMHx liver transplant, psoriasis, vertigo, hernia, HTN. Presents with concerns chest pain. Patient's pain began without clear precipitant within the past 6 hours.  Initially the pain was severe, sternal, nonradiating, positive nausea, positive SOB, lasted for approximately 90 minutes; resolved with nitroglycerin. Patient also states positive fatigue x1 week  Review of Systems: The patient denies anorexia, fever, weight loss,, vision loss, decreased hearing, hoarseness, syncope, dyspnea on exertion, peripheral edema, balance deficits, hemoptysis, abdominal pain, melena, hematochezia, severe indigestion/heartburn, hematuria, incontinence, genital sores, muscle weakness, suspicious skin lesions, transient blindness, difficulty walking, depression, unusual weight change, abnormal bleeding, enlarged lymph nodes, angioedema, and breast masses.    TRAVEL HISTORY: None    Past Medical History  Diagnosis Date  . Status post liver transplant   . Psoriasis   . Hypertension    Past Surgical History  Procedure Laterality Date  . Liver surgery    . Colonoscopy    . Upper gastrointestinal endoscopy    . Liver biopsy    . Hernia repair     Social History:  Smoked 1.00 pack per day x20 yrs stopped 1998. He has never used smokeless tobacco. He reports that he does not drink alcohol or use illicit drugs.     No Known Allergies  Family History  Problem Relation Age of Onset  . Diabetes Mother   . Heart disease Father   . Heart disease Son      Prior to Admission medications   Medication Sig Start Date End Date Taking? Authorizing Provider  amphetamine-dextroamphetamine (ADDERALL, 10MG ,) 10 MG tablet Take 10 mg by mouth as needed.   Yes Historical  Provider, MD  buPROPion (WELLBUTRIN) 75 MG tablet Take 75 mg by mouth 2 (two) times daily.    Yes Historical Provider, MD  calcium carbonate (OS-CAL) 600 MG TABS Take 600 mg by mouth 2 (two) times daily with a meal.   Yes Historical Provider, MD  FLUoxetine (PROZAC) 20 MG tablet Take 20 mg by mouth daily.   Yes Historical Provider, MD  ibuprofen (ADVIL,MOTRIN) 800 MG tablet Take 800 mg by mouth every 6 (six) hours as needed for pain.   Yes Historical Provider, MD  lisinopril (PRINIVIL,ZESTRIL) 10 MG tablet Take 10 mg by mouth daily.   Yes Historical Provider, MD  Magnesium 500 MG CAPS Take 500 mg by mouth daily.    Yes Historical Provider, MD  MULTIPLE VITAMINS PO Take by mouth daily.   Yes Historical Provider, MD  mycophenolate (CELLCEPT) 250 MG capsule Take 250-500 mg by mouth 2 (two) times daily. Patient takes 500 mg in the morning , 250 mg in the evening   Yes Historical Provider, MD  ondansetron (ZOFRAN) 8 MG tablet Take 8 mg by mouth as needed.  08/19/11  Yes Historical Provider, MD  tacrolimus (PROGRAF) 1 MG capsule Take 1-2 mg by mouth 2 (two) times daily. Patient takes 2 mg in the morning , 1 mg at night   Yes Historical Provider, MD  zolpidem (AMBIEN) 5 MG tablet Take 5 mg by mouth at bedtime as needed.   Yes Historical Provider, MD   Physical Exam: Filed Vitals:   03/26/13 1700 03/26/13 1730 03/26/13 1830 03/26/13 1922  BP: 148/82 147/84 129/66 148/77  Pulse: 65 69 64 63  Temp:  98 F (36.7 C)  TempSrc:      Resp: 12 17 17 20   Height:    6' (1.829 m)  Weight:    82.4 kg (181 lb 10.5 oz)  SpO2: 98% 98% 96% 100%     General:  A./O. x4, NAD  Eyes: Pupils equal reactive to light and accommodation  Neck: Negative JVD  Cardiovascular: Regular rhythm and rate, negative murmurs rubs gallops, DP/PT 2+ bilateral  Respiratory: Good auscultation bilateral  Abdomen: Soft nontender nondistended plus bowel sounds  Musculoskeletal: Negative pedal edema  Neurologic: Is equal  reactive to light and accommodation, cranial nerves II through XII intact, all extremities strength 5/5, sensation intact throughout  Labs on Admission:  Basic Metabolic Panel:  Recent Labs Lab 03/26/13 1453  NA 140  K 4.3  CL 106  CO2 26  GLUCOSE 78  BUN 15  CREATININE 1.04  CALCIUM 8.2*   Liver Function Tests:  Recent Labs Lab 03/26/13 1453  AST 92*  ALT 51  ALKPHOS 140*  BILITOT 0.6  PROT 7.0  ALBUMIN 3.1*   No results found for this basename: LIPASE, AMYLASE,  in the last 168 hours No results found for this basename: AMMONIA,  in the last 168 hours CBC:  Recent Labs Lab 03/26/13 1453  WBC 6.9  HGB 13.3  HCT 38.9*  MCV 82.2  PLT 204   Cardiac Enzymes: No results found for this basename: CKTOTAL, CKMB, CKMBINDEX, TROPONINI,  in the last 168 hours  BNP (last 3 results)  Recent Labs  03/26/13 1453  PROBNP 192.2*   CBG: No results found for this basename: GLUCAP,  in the last 168 hours  Radiological Exams on Admission: Dg Chest 2 View  03/26/2013   CLINICAL DATA:  Chest pain and shortness of breath.  EXAM: CHEST  2 VIEW  COMPARISON:  Plain film of the chest 01/06/2008.  FINDINGS: Heart size and mediastinal contours are within normal limits. Both lungs are clear. Visualized skeletal structures are unremarkable.  IMPRESSION: Negative exam.   Electronically Signed   By: Inge Rise M.D.   On: 03/26/2013 15:20    EKG: No previous EKG for comparison, sinus rhythm, bradycardic, anteverted T waves in 1 aVL, V1, V2 cannot rule out anterior lateral MI   Assessment/Plan Active Problems:   * No active hospital problems. *   1. Chest pain; rule out MI vs ischemia  2. liver transplant; stable per patient;  patient recently had labs which were sent to Dr. Armandina Stammer (hepatologists) at [facility]  3. HTN; patient not within [facility] guidelines start on beta blocker -Obtain lipid panel -Obtain hemoglobin A1c   Code Status: Full Family Communication:   Disposition Plan:    time spent; 60 minutes   Asah Lamay, Geraldo Docker [facility] Pager [phone]  If 7PM-7AM, please contact night-coverage [email or web address] Password [email or web address] 03/26/2013, 7:38 PM

## 2013-03-27 LAB — HEMOGLOBIN A1C
Hgb A1c MFr Bld: 5.8 % — ABNORMAL HIGH (ref <5.7)
Mean Plasma Glucose: 120 mg/dL — ABNORMAL HIGH (ref <117)

## 2013-03-27 LAB — TROPONIN I
Troponin I: <0.3 ng/mL (ref <0.30)
Troponin I: <0.3 ng/mL (ref <0.30)

## 2013-03-27 LAB — T4, FREE: Free T4: 1.08 ng/dL (ref 0.80–1.80)

## 2013-03-27 MED ORDER — NITROGLYCERIN 0.4 MG SL SUBL: 0.4000 mg | SUBLINGUAL_TABLET | SUBLINGUAL | Status: DC | PRN

## 2013-03-27 MED ORDER — ASPIRIN 81 MG PO TBEC: 81.0000 mg | DELAYED_RELEASE_TABLET | Freq: Every day | ORAL | Status: DC

## 2013-03-27 MED ORDER — METOPROLOL TARTRATE 12.5 MG HALF TABLET: 12.5000 mg | ORAL_TABLET | Freq: Two times a day (BID) | ORAL | Status: DC

## 2013-03-27 NOTE — Progress Notes (Addendum)
  Echocardiogram 2D Echocardiogram has been performed. Resources were not available to administer Definity.  Rory Montel 03/27/2013, 1:02 PM

## 2013-03-27 NOTE — Discharge Summary (Signed)
Physician Discharge Summary  Patient ID: Joseph Greer MRN: [id number] DOB/AGE: 56-28-1957 56 y.o.  Admit date: 03/26/2013 Discharge date: 03/27/2013  Primary Care Physician:  Monico Blitz, MD  Discharge Diagnoses:    . atypical Chest pain- resolved  . HTN (hypertension) History of liver transplant  Consults: None   Recommendations for Outpatient Follow-up:  Patient had no cardiac source of chest pain, if he has any repeat episodes of chest pain, would recommend a GI evaluation   Allergies:  No Known Allergies   Discharge Medications:   Medication List         amphetamine-dextroamphetamine 10 MG tablet  Commonly known as:  ADDERALL  Take 10 mg by mouth daily as needed (to help concentration).     aspirin 81 MG EC tablet  Take 1 tablet (81 mg total) by mouth daily.     buPROPion 75 MG tablet  Commonly known as:  WELLBUTRIN  Take 75 mg by mouth 2 (two) times daily.     calcium carbonate 600 MG Tabs tablet  Commonly known as:  OS-CAL  Take 600 mg by mouth 2 (two) times daily with a meal.     CELLCEPT 250 MG capsule  Generic drug:  mycophenolate  Take 500 mg by mouth 2 (two) times daily.     FLUoxetine 20 MG tablet  Commonly known as:  PROZAC  Take 20 mg by mouth daily.     lisinopril 10 MG tablet  Commonly known as:  PRINIVIL,ZESTRIL  Take 10 mg by mouth daily.     Magnesium 500 MG Caps  Take 500 mg by mouth daily.     metoprolol tartrate 12.5 mg Tabs tablet  Commonly known as:  LOPRESSOR  Take 0.5 tablets (12.5 mg total) by mouth 2 (two) times daily.     multivitamin with minerals Tabs tablet  Take 1 tablet by mouth daily.     nitroGLYCERIN 0.4 MG SL tablet  Commonly known as:  NITROSTAT  Place 1 tablet (0.4 mg total) under the tongue every 5 (five) minutes x 3 doses as needed for chest pain.     tacrolimus 1 MG capsule  Commonly known as:  PROGRAF  Take 1-2 mg by mouth 2 (two) times daily. Patient takes 2 mg in the morning , 1 mg at night      zolpidem 10 MG tablet  Commonly known as:  AMBIEN  Take 10 mg by mouth at bedtime as needed for sleep.         Brief H and P: For complete details please refer to admission H and P, but in brief patient is a 57 year old male with history of liver transplant, vertigo,  hypertension presented with atypical chest pain which began without any clear precipitating factor within 6 hours prior to admission. He reported the pain as severe substernal, nonradiating, with nausea and shortness of breath which lasted about 90 minutes and resolved with nitroglycerin. Patient was admitted for further workup.   Hospital Course:  Atypical chest pain: Completely resolved at the time of admission. 3 sets of cardiac enzymes were negative for any acute ACS. EKG showed rate 58, normal sinus rhythm, nonspecific T wave changes. D-dimer was negative. 2-D echo showed moderate left ventricle concentric hypertrophy, systolic function normal mild aortic valve regurgitation. I also recommended patient to have GI evaluation if he has any repeat episodes of chest pain. Patient was discharged home in stable condition.  Day of Discharge BP 147/87  Pulse 60  Temp(Src) 97.6 F (36.4  C) (Oral)  Resp 18  Ht 6' (1.829 m)  Wt 82.4 kg (181 lb 10.5 oz)  BMI 24.63 kg/m2  SpO2 98%  Physical Exam: General: Alert and awake oriented x3 not in any acute distress. CVS: S1-S2 clear no murmur rubs or gallops Chest: clear to auscultation bilaterally, no wheezing rales or rhonchi Abdomen: soft nontender, nondistended, normal bowel sounds Extremities: no cyanosis, clubbing or edema noted bilaterally Neuro: Cranial nerves II-XII intact, no focal neurological deficits   The results of significant diagnostics from this hospitalization (including imaging, microbiology, ancillary and laboratory) are listed below for reference.    LAB RESULTS: Basic Metabolic Panel:  Recent Labs Lab 03/26/13 1453 03/26/13 2230  NA 140 141  K  4.3 4.1  CL 106 106  CO2 26 28  GLUCOSE 78 109*  BUN 15 14  CREATININE 1.04 1.04  CALCIUM 8.2* 8.3*  MG  --  1.8   Liver Function Tests:  Recent Labs Lab 03/26/13 1453 03/26/13 2230  AST 92* 81*  ALT 51 47  ALKPHOS 140* 137*  BILITOT 0.6 0.5  PROT 7.0 6.7  ALBUMIN 3.1* 2.9*   No results found for this basename: LIPASE, AMYLASE,  in the last 168 hours No results found for this basename: AMMONIA,  in the last 168 hours CBC:  Recent Labs Lab 03/26/13 1453 03/26/13 2230  WBC 6.9 5.9  NEUTROABS  --  3.5  HGB 13.3 13.3  HCT 38.9* 39.0  MCV 82.2 82.3  PLT 204 226   Cardiac Enzymes:  Recent Labs Lab 03/27/13 0215 03/27/13 0725  TROPONINI <0.30 <0.30   BNP: No components found with this basename: POCBNP,  CBG: No results found for this basename: GLUCAP,  in the last 168 hours  Significant Diagnostic Studies:  Dg Chest 2 View  03/26/2013   CLINICAL DATA:  Chest pain and shortness of breath.  EXAM: CHEST  2 VIEW  COMPARISON:  Plain film of the chest 01/06/2008.  FINDINGS: Heart size and mediastinal contours are within normal limits. Both lungs are clear. Visualized skeletal structures are unremarkable.  IMPRESSION: Negative exam.   Electronically Signed   By: Inge Rise M.D.   On: 03/26/2013 15:20    2D ECHO: Study Conclusions  - Left ventricle: The cavity size was normal. There was moderate concentric hypertrophy. Systolic function was normal. - Aortic valve: Mild regurgitation. - Aortic root: The aortic root was mildly dilated.    Disposition and Follow-up:     Discharge Orders   Future Orders Complete By Expires   Diet - low sodium heart healthy  As directed    Increase activity slowly  As directed        DISPOSITION: Home DIET: Heart healthy diet ACTIVITY: As tolerated   DISCHARGE FOLLOW-UP Follow-up Information   Follow up with [facility], MD. Schedule an appointment as soon as possible for a visit in 2 weeks. (for hospital  follow-up)    Specialty:  Internal Medicine   Contact information:   [place]  [phone]       Time spent on Discharge: 40 minutes  Signed:   RAI,RIPUDEEP M.D. [facility] 03/27/2013, 3:33 PM Pager: [phone]

## 2013-04-09 ENCOUNTER — Encounter (INDEPENDENT_AMBULATORY_CARE_PROVIDER_SITE_OTHER): Payer: Self-pay | Admitting: Internal Medicine

## 2013-04-09 ENCOUNTER — Ambulatory Visit (INDEPENDENT_AMBULATORY_CARE_PROVIDER_SITE_OTHER): Payer: Medicare Other | Admitting: Internal Medicine

## 2013-04-09 VITALS — BP 110/64 | HR 64 | Temp 98.0°F | Ht 72.0 in | Wt 183.6 lb

## 2013-04-09 DIAGNOSIS — R079 Chest pain, unspecified: Secondary | ICD-10-CM | POA: Insufficient documentation

## 2013-04-09 NOTE — Progress Notes (Signed)
Subjective:     Patient ID: Joseph Greer, male   DOB: 05/18/1956, 57 y.o.   MRN: [id number]  HPIHere today for f/u after recent admission to [facility] for chest pain.(10/22). He tells me he had tightness in his chest and SOB. He stopped at a [facility] in [place] and was transferred to [facility].  He rated the pain at a 8/10. Pain lasted approximately 90 minutes until he took the NTG. Pain relieved after taking NTG.   He denies having similar pain in the past. He believes his pain was stress related. He says his wife is dying.  His son was recently in a wreck. He says he was not upset when he had the pain. He denies any acid reflux. There has been no abdominal pian.   He apparently had chest pain which improved with NTG.   Per EMS, the patient had substantial pain on their arrival, but this improved following provision of nitroglycerin. Cardiac work up was negative for MI. Echo cardiogram    showed moderate left ventricle concentric hypertrophy, systolic function normal, mild aortic valve regurgitation.     Hx of Hepatitis C. And was transplanted 2009 at [facility] He is followed at [facility] every 6 months.  CMP     Component Value Date/Time   NA 141 03/26/2013 2230   K 4.1 03/26/2013 2230   CL 106 03/26/2013 2230   CO2 28 03/26/2013 2230   GLUCOSE 109* 03/26/2013 2230   BUN 14 03/26/2013 2230   CREATININE 1.04 03/26/2013 2230   CALCIUM 8.3* 03/26/2013 2230   PROT 6.7 03/26/2013 2230   ALBUMIN 2.9* 03/26/2013 2230   AST 81* 03/26/2013 2230   ALT 47 03/26/2013 2230   ALKPHOS 137* 03/26/2013 2230   BILITOT 0.5 03/26/2013 2230   GFRNONAA 78* 03/26/2013 2230   GFRAA >90 03/26/2013 2230    CBC    Component Value Date/Time   WBC 5.9 03/26/2013 2230   RBC 4.74 03/26/2013 2230   HGB 13.3 03/26/2013 2230   HCT 39.0 03/26/2013 2230   PLT 226 03/26/2013 2230   MCV 82.3 03/26/2013 2230   MCH 28.1 03/26/2013 2230   MCHC 34.1 03/26/2013 2230   RDW 14.2 03/26/2013 2230   LYMPHSABS 1.6  03/26/2013 2230   MONOABS 0.5 03/26/2013 2230   EOSABS 0.2 03/26/2013 2230   BASOSABS 0.1 03/26/2013 2230  .  D. Dimer was normal.   Review of Systems see hpi  Current Outpatient Prescriptions  Medication Sig Dispense Refill  . aspirin EC 81 MG EC tablet Take 1 tablet (81 mg total) by mouth daily.  30 tablet  3  . buPROPion (WELLBUTRIN) 75 MG tablet Take 75 mg by mouth 2 (two) times daily.       . calcium carbonate (OS-CAL) 600 MG TABS Take 600 mg by mouth 2 (two) times daily with a meal.      . FLUoxetine (PROZAC) 20 MG tablet Take 20 mg by mouth daily.      Marland Kitchen lisinopril (PRINIVIL,ZESTRIL) 10 MG tablet Take 10 mg by mouth daily.      . Magnesium 500 MG CAPS Take 500 mg by mouth daily.       . Multiple Vitamin (MULTIVITAMIN WITH MINERALS) TABS tablet Take 1 tablet by mouth daily.      . mycophenolate (CELLCEPT) 250 MG capsule Take 500 mg by mouth 2 (two) times daily.      . nitroGLYCERIN (NITROSTAT) 0.4 MG SL tablet Place 1 tablet (0.4 mg total) under  the tongue every 5 (five) minutes x 3 doses as needed for chest pain.  30 tablet  12  . tacrolimus (PROGRAF) 1 MG capsule Take 1-2 mg by mouth 2 (two) times daily. Patient takes 2 mg in the morning , 1 mg at night      . zolpidem (AMBIEN) 10 MG tablet Take 10 mg by mouth at bedtime as needed for sleep.      . metoprolol tartrate (LOPRESSOR) 12.5 mg TABS tablet Take 0.5 tablets (12.5 mg total) by mouth 2 (two) times daily.  30 tablet  3   No current facility-administered medications for this visit.   Past Medical History  Diagnosis Date  . Status post liver transplant   . Psoriasis   . Hypertension   . Hepatitis C    Past Surgical History  Procedure Laterality Date  . Liver surgery    . Colonoscopy    . Upper gastrointestinal endoscopy    . Liver biopsy    . Hernia repair     No Known Allergies      Objective:   Physical Exam  Filed Vitals:   04/09/13 1124  BP: 110/64  Pulse: 64  Temp: 98 F (36.7 C)  Height: 6'  (1.829 m)  Weight: 183 lb 9.6 oz (83.28 kg)        Assessment:   Chest pain. Cardiac origin ruled out at [facility]. He denies previous hx of chest pain. I doubt this is GI origin.     Plan:    Chest pain resolved. Cardiac work up was negative. I do not think this is GI related. If symptoms reoccur, go to the nearest ED.

## 2013-04-09 NOTE — Patient Instructions (Signed)
If pain reoccurs, go to the nearest emergency room.

## 2013-08-05 ENCOUNTER — Ambulatory Visit (HOSPITAL_COMMUNITY): Payer: Medicare Other | Admitting: Psychiatry

## 2013-09-16 ENCOUNTER — Ambulatory Visit (INDEPENDENT_AMBULATORY_CARE_PROVIDER_SITE_OTHER): Payer: Medicare Other | Admitting: Internal Medicine

## 2014-12-24 DIAGNOSIS — R768 Other specified abnormal immunological findings in serum: Secondary | ICD-10-CM | POA: Insufficient documentation

## 2015-02-10 ENCOUNTER — Encounter (INDEPENDENT_AMBULATORY_CARE_PROVIDER_SITE_OTHER): Payer: Self-pay | Admitting: *Deleted

## 2016-07-04 ENCOUNTER — Encounter: Payer: Self-pay | Admitting: Internal Medicine

## 2017-01-25 DIAGNOSIS — Z85828 Personal history of other malignant neoplasm of skin: Secondary | ICD-10-CM | POA: Insufficient documentation

## 2017-01-31 ENCOUNTER — Telehealth (INDEPENDENT_AMBULATORY_CARE_PROVIDER_SITE_OTHER): Payer: Self-pay | Admitting: *Deleted

## 2017-01-31 ENCOUNTER — Encounter (INDEPENDENT_AMBULATORY_CARE_PROVIDER_SITE_OTHER): Payer: Self-pay | Admitting: *Deleted

## 2017-01-31 ENCOUNTER — Other Ambulatory Visit (INDEPENDENT_AMBULATORY_CARE_PROVIDER_SITE_OTHER): Payer: Self-pay | Admitting: *Deleted

## 2017-01-31 DIAGNOSIS — Z860100 Personal history of colon polyps, unspecified: Secondary | ICD-10-CM | POA: Insufficient documentation

## 2017-01-31 MED ORDER — PEG 3350-KCL-NA BICARB-NACL 420 G PO SOLR: 4000.0000 mL | Freq: Once | ORAL | 0 refills | Status: AC

## 2017-01-31 NOTE — Telephone Encounter (Signed)
Patient needs trilyte 

## 2017-02-08 ENCOUNTER — Telehealth (INDEPENDENT_AMBULATORY_CARE_PROVIDER_SITE_OTHER): Payer: Self-pay | Admitting: *Deleted

## 2017-02-08 NOTE — Telephone Encounter (Signed)
Referring MD/PCP: shah   Procedure: tcs  Reason/Indication:  Hx polyps  Has patient had this procedure before?  Yes, 2011 -- paper chart  If so, when, by whom and where?    Is there a family history of colon cancer?  no  Who?  What age when diagnosed?    Is patient diabetic?   no      Does patient have prosthetic heart valve or mechanical valve?  no  Do you have a pacemaker?  no  Has patient ever had endocarditis? no  Has patient had joint replacement within last 12 months?  no  Does patient tend to be constipated or take laxatives? no  Does patient have a history of alcohol/drug use?  no  Is patient on Coumadin, Plavix and/or Aspirin? yes  Medications: see epic  Allergies: see epic  Medication Adjustment per Dr Laural Golden: asa 2 days  Procedure date & time: 03/09/17 at 915 Prep 10/3 at 11:00

## 2017-02-09 NOTE — Telephone Encounter (Signed)
agree

## 2017-02-13 DIAGNOSIS — D849 Immunodeficiency, unspecified: Secondary | ICD-10-CM | POA: Insufficient documentation

## 2017-03-07 ENCOUNTER — Other Ambulatory Visit (HOSPITAL_COMMUNITY): Payer: Medicare Other

## 2017-03-09 ENCOUNTER — Ambulatory Visit (HOSPITAL_COMMUNITY): Admit: 2017-03-09 | Payer: Medicare Other | Admitting: Internal Medicine

## 2017-03-09 ENCOUNTER — Encounter (HOSPITAL_COMMUNITY): Payer: Self-pay

## 2017-03-09 SURGERY — COLONOSCOPY WITH PROPOFOL
Anesthesia: Monitor Anesthesia Care

## 2017-04-09 ENCOUNTER — Other Ambulatory Visit (INDEPENDENT_AMBULATORY_CARE_PROVIDER_SITE_OTHER): Payer: Self-pay | Admitting: Internal Medicine

## 2017-04-09 DIAGNOSIS — Z86010 Personal history of colonic polyps: Secondary | ICD-10-CM

## 2017-04-10 ENCOUNTER — Telehealth (INDEPENDENT_AMBULATORY_CARE_PROVIDER_SITE_OTHER): Payer: Self-pay | Admitting: *Deleted

## 2017-04-10 ENCOUNTER — Encounter (INDEPENDENT_AMBULATORY_CARE_PROVIDER_SITE_OTHER): Payer: Self-pay | Admitting: *Deleted

## 2017-04-10 ENCOUNTER — Telehealth: Payer: Self-pay | Admitting: Radiology

## 2017-04-10 NOTE — Telephone Encounter (Signed)
-----   Message from Josue Hector sent at 04/10/2017  8:14 AM EST ----- Regarding: surgery Pre op 12/7 @ 9:00, surgery 12/13 @ 7:30

## 2017-04-10 NOTE — Telephone Encounter (Signed)
Did not call this patient this is error. This is not Dr Althia Forts patient.

## 2017-04-10 NOTE — Telephone Encounter (Signed)
Patient needs trilyte 

## 2017-04-11 ENCOUNTER — Telehealth: Payer: Self-pay | Admitting: Radiology

## 2017-04-11 MED ORDER — PEG 3350-KCL-NA BICARB-NACL 420 G PO SOLR: 4000.0000 mL | Freq: Once | ORAL | 0 refills | Status: AC

## 2017-04-11 NOTE — Telephone Encounter (Signed)
Error

## 2017-05-03 ENCOUNTER — Telehealth (INDEPENDENT_AMBULATORY_CARE_PROVIDER_SITE_OTHER): Payer: Self-pay | Admitting: *Deleted

## 2017-05-03 NOTE — Telephone Encounter (Signed)
Referring MD/PCP: shah   Procedure: tcs w propofol  Reason/Indication:  Hx polyps  Has patient had this procedure before?  Yes, 2011  If so, when, by whom and where?    Is there a family history of colon cancer?  no  Who?  What age when diagnosed?    Is patient diabetic?   no      Does patient have prosthetic heart valve or mechanical valve?  no  Do you have a pacemaker?   no  Has patient ever had endocarditis? no  Has patient had joint replacement within last 12 months?  no  Is patient constipated or take laxatives? no  Does patient have a history of alcohol/drug use?  no  Is patient on Coumadin, Plavix and/or Aspirin? yes  Medications: see epic  Allergies: see epic  Medication Adjustment per Dr Laural Golden: asa 2 days  Procedure date & time: 06/01/17 at 35

## 2017-05-03 NOTE — Telephone Encounter (Signed)
agree

## 2017-05-24 ENCOUNTER — Encounter (HOSPITAL_COMMUNITY): Payer: Self-pay

## 2017-05-24 NOTE — Patient Instructions (Signed)
   Your procedure is scheduled on: 06/01/2017  Report to [facility] at  6:15   AM.  Call this number if you have problems the morning of surgery: [phone]   Remember:              Follow Directions on the letter you received from Your Physician's office regarding the Bowel Prep  :  Take these medicines the morning of surgery with A SIP OF WATER: Bupropion, prozac, lisinopril, and Metoprolol   Do not wear jewelry, make-up or nail polish.    Do not bring valuables to the hospital.  Contacts, dentures or bridgework may not be worn into surgery.  .   Patients discharged the day of surgery will not be allowed to drive home.     Colonoscopy, Adult, Care After This sheet gives you information about how to care for yourself after your procedure. Your health care provider may also give you more specific instructions. If you have problems or questions, contact your health care provider. What can I expect after the procedure? After the procedure, it is common to have:  A small amount of blood in your stool for 24 hours after the procedure.  Some gas.  Mild abdominal cramping or bloating.  Follow these instructions at home: General instructions   For the first 24 hours after the procedure: ? Do not drive or use machinery. ? Do not sign important documents. ? Do not drink alcohol. ? Do your regular daily activities at a slower pace than normal. ? Eat soft, easy-to-digest foods. ? Rest often.  Take over-the-counter or prescription medicines only as told by your health care provider.  It is up to you to get the results of your procedure. Ask your health care provider, or the department performing the procedure, when your results will be ready. Relieving cramping and bloating  Try walking around when you have cramps or feel bloated.  Apply heat to your abdomen as told by your health care provider. Use a heat source that your health care provider recommends, such as a moist heat  pack or a heating pad. ? Place a towel between your skin and the heat source. ? Leave the heat on for 20-30 minutes. ? Remove the heat if your skin turns bright red. This is especially important if you are unable to feel pain, heat, or cold. You may have a greater risk of getting burned. Eating and drinking  Drink enough fluid to keep your urine clear or pale yellow.  Resume your normal diet as instructed by your health care provider. Avoid heavy or fried foods that are hard to digest.  Avoid drinking alcohol for as long as instructed by your health care provider. Contact a health care provider if:  You have blood in your stool 2-3 days after the procedure. Get help right away if:  You have more than a small spotting of blood in your stool.  You pass large blood clots in your stool.  Your abdomen is swollen.  You have nausea or vomiting.  You have a fever.  You have increasing abdominal pain that is not relieved with medicine. This information is not intended to replace advice given to you by your health care provider. Make sure you discuss any questions you have with your health care provider. Document Released: 01/04/2004 Document Revised: 02/14/2016 Document Reviewed: 08/03/2015 Elsevier Interactive Patient Education  [employer].

## 2017-05-25 ENCOUNTER — Other Ambulatory Visit: Payer: Self-pay

## 2017-05-25 ENCOUNTER — Encounter (HOSPITAL_COMMUNITY): Payer: Self-pay

## 2017-05-25 ENCOUNTER — Encounter (HOSPITAL_COMMUNITY)
Admission: RE | Admit: 2017-05-25 | Discharge: 2017-05-25 | Disposition: A | Discharge status: Home / Self Care | Payer: Medicare Other | Source: Ambulatory Visit | Attending: Internal Medicine | Admitting: Internal Medicine

## 2017-05-25 DIAGNOSIS — Z01810 Encounter for preprocedural cardiovascular examination: Secondary | ICD-10-CM | POA: Insufficient documentation

## 2017-05-25 DIAGNOSIS — Z86010 Personal history of colonic polyps: Secondary | ICD-10-CM

## 2017-05-25 DIAGNOSIS — I10 Essential (primary) hypertension: Secondary | ICD-10-CM | POA: Diagnosis not present

## 2017-05-25 DIAGNOSIS — Z01812 Encounter for preprocedural laboratory examination: Secondary | ICD-10-CM | POA: Diagnosis not present

## 2017-05-25 HISTORY — DX: Depression, unspecified: F32.A

## 2017-05-25 HISTORY — DX: Major depressive disorder, single episode, unspecified: F32.9

## 2017-05-25 HISTORY — DX: Anxiety disorder, unspecified: F41.9

## 2017-05-25 LAB — BASIC METABOLIC PANEL
Anion gap: 11 (ref 5–15)
BUN: 24 mg/dL — AB (ref 6–20)
CHLORIDE: 104 mmol/L (ref 101–111)
CO2: 23 mmol/L (ref 22–32)
Calcium: 8.9 mg/dL (ref 8.9–10.3)
Creatinine, Ser: 1.38 mg/dL — ABNORMAL HIGH (ref 0.61–1.24)
GFR calc Af Amer: >60 mL/min (ref >60)
GFR calc non Af Amer: 54 mL/min — ABNORMAL LOW (ref >60)
GLUCOSE: 119 mg/dL — AB (ref 65–99)
Potassium: 4.6 mmol/L (ref 3.5–5.1)
Sodium: 138 mmol/L (ref 135–145)

## 2017-05-25 LAB — CBC
HCT: 42.4 % (ref 39.0–52.0)
HEMOGLOBIN: 13.6 g/dL (ref 13.0–17.0)
MCH: 28.2 pg (ref 26.0–34.0)
MCHC: 32.1 g/dL (ref 30.0–36.0)
MCV: 87.8 fL (ref 78.0–100.0)
Platelets: 196 10*3/uL (ref 150–400)
RBC: 4.83 MIL/uL (ref 4.22–5.81)
RDW: 14 % (ref 11.5–15.5)
WBC: 6.6 10*3/uL (ref 4.0–10.5)

## 2017-05-30 ENCOUNTER — Telehealth (INDEPENDENT_AMBULATORY_CARE_PROVIDER_SITE_OTHER): Payer: Self-pay | Admitting: *Deleted

## 2017-05-30 MED ORDER — PEG 3350-KCL-NA BICARB-NACL 420 G PO SOLR: 4000.0000 mL | Freq: Once | ORAL | 0 refills | Status: AC

## 2017-05-30 NOTE — Telephone Encounter (Signed)
Patient needs trilyte 

## 2017-06-01 ENCOUNTER — Ambulatory Visit (HOSPITAL_COMMUNITY): Payer: Medicare Other | Admitting: Anesthesiology

## 2017-06-01 ENCOUNTER — Encounter (HOSPITAL_COMMUNITY)
Admission: RE | Disposition: A | Discharge status: Home / Self Care | Payer: Self-pay | Source: Ambulatory Visit | Attending: Internal Medicine

## 2017-06-01 ENCOUNTER — Ambulatory Visit (HOSPITAL_COMMUNITY)
Admission: RE | Admit: 2017-06-01 | Discharge: 2017-06-01 | Disposition: A | Discharge status: Home / Self Care | Payer: Medicare Other | Source: Ambulatory Visit | Attending: Internal Medicine | Admitting: Internal Medicine

## 2017-06-01 ENCOUNTER — Encounter (HOSPITAL_COMMUNITY): Payer: Self-pay | Admitting: Anesthesiology

## 2017-06-01 DIAGNOSIS — F329 Major depressive disorder, single episode, unspecified: Secondary | ICD-10-CM | POA: Insufficient documentation

## 2017-06-01 DIAGNOSIS — Z860100 Personal history of colon polyps, unspecified: Secondary | ICD-10-CM

## 2017-06-01 DIAGNOSIS — D122 Benign neoplasm of ascending colon: Secondary | ICD-10-CM

## 2017-06-01 DIAGNOSIS — Z09 Encounter for follow-up examination after completed treatment for conditions other than malignant neoplasm: Secondary | ICD-10-CM | POA: Diagnosis not present

## 2017-06-01 DIAGNOSIS — D123 Benign neoplasm of transverse colon: Secondary | ICD-10-CM | POA: Insufficient documentation

## 2017-06-01 DIAGNOSIS — Z87891 Personal history of nicotine dependence: Secondary | ICD-10-CM | POA: Insufficient documentation

## 2017-06-01 DIAGNOSIS — F419 Anxiety disorder, unspecified: Secondary | ICD-10-CM | POA: Insufficient documentation

## 2017-06-01 DIAGNOSIS — Z1211 Encounter for screening for malignant neoplasm of colon: Secondary | ICD-10-CM | POA: Diagnosis present

## 2017-06-01 DIAGNOSIS — B1920 Unspecified viral hepatitis C without hepatic coma: Secondary | ICD-10-CM | POA: Insufficient documentation

## 2017-06-01 DIAGNOSIS — Z7982 Long term (current) use of aspirin: Secondary | ICD-10-CM | POA: Diagnosis not present

## 2017-06-01 DIAGNOSIS — D125 Benign neoplasm of sigmoid colon: Secondary | ICD-10-CM

## 2017-06-01 DIAGNOSIS — I10 Essential (primary) hypertension: Secondary | ICD-10-CM | POA: Insufficient documentation

## 2017-06-01 DIAGNOSIS — Z944 Liver transplant status: Secondary | ICD-10-CM | POA: Diagnosis not present

## 2017-06-01 DIAGNOSIS — Z79899 Other long term (current) drug therapy: Secondary | ICD-10-CM | POA: Diagnosis not present

## 2017-06-01 HISTORY — PX: POLYPECTOMY: SHX5525

## 2017-06-01 HISTORY — PX: COLONOSCOPY WITH PROPOFOL: SHX5780

## 2017-06-01 SURGERY — COLONOSCOPY WITH PROPOFOL
Anesthesia: Monitor Anesthesia Care

## 2017-06-01 MED ORDER — FENTANYL CITRATE (PF) 100 MCG/2ML IJ SOLN
INTRAMUSCULAR | Status: AC
  Filled 2017-06-01: qty 2

## 2017-06-01 MED ORDER — CHLORHEXIDINE GLUCONATE CLOTH 2 % EX PADS: 6.0000 | MEDICATED_PAD | Freq: Once | CUTANEOUS | Status: DC

## 2017-06-01 MED ORDER — FENTANYL CITRATE (PF) 100 MCG/2ML IJ SOLN
25.0000 ug | Freq: Once | INTRAMUSCULAR | Status: AC
Start: 2017-06-01 — End: 2017-06-01
  Administered 2017-06-01: 25 ug via INTRAVENOUS

## 2017-06-01 MED ORDER — PROPOFOL 10 MG/ML IV BOLUS
INTRAVENOUS | Status: AC
  Filled 2017-06-01: qty 40

## 2017-06-01 MED ORDER — PROPOFOL 500 MG/50ML IV EMUL
INTRAVENOUS | Status: DC | PRN
  Administered 2017-06-01: 150 ug/kg/min via INTRAVENOUS
  Administered 2017-06-01: 08:00:00 via INTRAVENOUS

## 2017-06-01 MED ORDER — MIDAZOLAM HCL 2 MG/2ML IJ SOLN
INTRAMUSCULAR | Status: AC
  Filled 2017-06-01: qty 2

## 2017-06-01 MED ORDER — LACTATED RINGERS IV SOLN
INTRAVENOUS | Status: DC
  Administered 2017-06-01: 07:00:00 via INTRAVENOUS

## 2017-06-01 MED ORDER — MIDAZOLAM HCL 2 MG/2ML IJ SOLN
1.0000 mg | INTRAMUSCULAR | Status: AC
  Administered 2017-06-01: 2 mg via INTRAVENOUS

## 2017-06-01 MED ORDER — PROPOFOL 10 MG/ML IV BOLUS
INTRAVENOUS | Status: DC | PRN
  Administered 2017-06-01: 20 mg via INTRAVENOUS

## 2017-06-01 MED ORDER — STERILE WATER FOR IRRIGATION IR SOLN
Status: DC | PRN
  Administered 2017-06-01: 100 mL

## 2017-06-01 MED ORDER — CHLORHEXIDINE GLUCONATE CLOTH 2 % EX PADS
6.0000 | MEDICATED_PAD | Freq: Once | CUTANEOUS | Status: DC
Start: 2017-06-01 — End: 2017-06-01

## 2017-06-01 NOTE — Anesthesia Postprocedure Evaluation (Signed)
Anesthesia Post Note  Patient: Joseph Greer  Procedure(s) Performed: COLONOSCOPY WITH PROPOFOL (N/A ) POLYPECTOMY  Patient location during evaluation: PACU Anesthesia Type: MAC Level of consciousness: awake and alert and oriented Pain management: pain level controlled Vital Signs Assessment: post-procedure vital signs reviewed and stable Respiratory status: spontaneous breathing Cardiovascular status: blood pressure returned to baseline Postop Assessment: no apparent nausea or vomiting Anesthetic complications: no     Last Vitals:  Vitals:   06/01/17 0825 06/01/17 0830  BP:    Pulse: 69 72  Resp: (!) 24 19  Temp:    SpO2: 97% 97%    Last Pain: There were no vitals filed for this visit.               Veroncia Jezek

## 2017-06-01 NOTE — Anesthesia Preprocedure Evaluation (Signed)
Anesthesia Evaluation  Patient identified by MRN, date of birth, ID band Patient awake    Reviewed: Allergy & Precautions, NPO status , Patient's Chart, lab work & pertinent test results  Airway Mallampati: II  TM Distance: <3 FB Neck ROM: Full    Dental  (+) Teeth Intact   Pulmonary former smoker,    breath sounds clear to auscultation       Cardiovascular hypertension,  Rhythm:Regular Rate:Normal     Neuro/Psych PSYCHIATRIC DISORDERS Anxiety Depression    GI/Hepatic negative GI ROS, (+) Hepatitis -Liver transplant    Endo/Other    Renal/GU negative Renal ROS     Musculoskeletal   Abdominal   Peds  Hematology negative hematology ROS (+)   Anesthesia Other Findings   Reproductive/Obstetrics                            Anesthesia Physical Anesthesia Plan  ASA: III  Anesthesia Plan: MAC   Post-op Pain Management:    Induction: Intravenous  PONV Risk Score and Plan:   Airway Management Planned: Simple Face Mask  Additional Equipment:   Intra-op Plan:   Post-operative Plan:   Informed Consent: I have reviewed the patients History and Physical, chart, labs and discussed the procedure including the risks, benefits and alternatives for the proposed anesthesia with the patient or authorized representative who has indicated his/her understanding and acceptance.     Plan Discussed with:   Anesthesia Plan Comments:         Anesthesia Quick Evaluation

## 2017-06-01 NOTE — Transfer of Care (Signed)
Immediate Anesthesia Transfer of Care Note  Patient: Joseph Greer [facility]  Procedure(s) Performed: COLONOSCOPY WITH PROPOFOL (N/A ) POLYPECTOMY  Patient Location: PACU  Anesthesia Type:MAC  Level of Consciousness: awake  Airway & Oxygen Therapy: Patient Spontanous Breathing  Post-op Assessment: Report given to RN  Post vital signs: Reviewed and stable  Last Vitals:  Vitals:   06/01/17 0640  BP: (!) 150/89  Resp: (!) 28  Temp: 36.7 C  SpO2: 97%    Last Pain: There were no vitals filed for this visit.       Complications: No apparent anesthesia complications

## 2017-06-01 NOTE — Discharge Instructions (Signed)
No Aspirin or NSAIDs for 24 hours. Resume usual medications and diet as before. No driving for 24 hours. Physician will call with biopsy results.       PATIENT INSTRUCTIONS POST-ANESTHESIA  IMMEDIATELY FOLLOWING SURGERY:  Do not drive or operate machinery for the first twenty four hours after surgery.  Do not make any important decisions for twenty four hours after surgery or while taking narcotic pain medications or sedatives.  If you develop intractable nausea and vomiting or a severe headache please notify your doctor immediately.  FOLLOW-UP:  Please make an appointment with your surgeon as instructed. You do not need to follow up with anesthesia unless specifically instructed to do so.  WOUND CARE INSTRUCTIONS (if applicable):  Keep a dry clean dressing on the anesthesia/puncture wound site if there is drainage.  Once the wound has quit draining you may leave it open to air.  Generally you should leave the bandage intact for twenty four hours unless there is drainage.  If the epidural site drains for more than 36-48 hours please call the anesthesia department.  QUESTIONS?:  Please feel free to call your physician or the hospital operator if you have any questions, and they will be happy to assist you.         Colonoscopy, Adult, Care After This sheet gives you information about how to care for yourself after your procedure. Your doctor may also give you more specific instructions. If you have problems or questions, call your doctor. Follow these instructions at home: General instructions   For the first 24 hours after the procedure: ? Do not drive or use machinery. ? Do not sign important documents. ? Do not drink alcohol. ? Do your daily activities more slowly than normal. ? Eat foods that are soft and easy to digest. ? Rest often.  Take over-the-counter or prescription medicines only as told by your doctor.  It is up to you to get the results of your procedure. Ask your  doctor, or the department performing the procedure, when your results will be ready. To help cramping and bloating:  Try walking around.  Put heat on your belly (abdomen) as told by your doctor. Use a heat source that your doctor recommends, such as a moist heat pack or a heating pad. ? Put a towel between your skin and the heat source. ? Leave the heat on for 20-30 minutes. ? Remove the heat if your skin turns bright red. This is especially important if you cannot feel pain, heat, or cold. You can get burned. Eating and drinking  Drink enough fluid to keep your pee (urine) clear or pale yellow.  Return to your normal diet as told by your doctor. Avoid heavy or fried foods that are hard to digest.  Avoid drinking alcohol for as long as told by your doctor. Contact a doctor if:  You have blood in your poop (stool) 2-3 days after the procedure. Get help right away if:  You have more than a small amount of blood in your poop.  You see large clumps of tissue (blood clots) in your poop.  Your belly is swollen.  You feel sick to your stomach (nauseous).  You throw up (vomit).  You have a fever.  You have belly pain that gets worse, and medicine does not help your pain. This information is not intended to replace advice given to you by your health care provider. Make sure you discuss any questions you have with your health care  provider. Document Released: 06/24/2010 Document Revised: 02/14/2016 Document Reviewed: 02/14/2016 Elsevier Interactive Patient Education  2017 [place].     Colon Polyps Polyps are tissue growths inside the body. Polyps can grow in many places, including the large intestine (colon). A polyp may be a round bump or a mushroom-shaped growth. You could have one polyp or several. Most colon polyps are noncancerous (benign). However, some colon polyps can become cancerous over time. What are the causes? The exact cause of colon polyps is not known. What  increases the risk? This condition is more likely to develop in people who:  Have a family history of colon cancer or colon polyps.  Are older than 71 or older than 45 if they are African American.  Have inflammatory bowel disease, such as ulcerative colitis or Crohn disease.  Are overweight.  Smoke cigarettes.  Do not get enough exercise.  Drink too much alcohol.  Eat a diet that is: ? High in fat and red meat. ? Low in fiber.  Had childhood cancer that was treated with abdominal radiation.  What are the signs or symptoms? Most polyps do not cause symptoms. If you have symptoms, they may include:  Blood coming from your rectum when having a bowel movement.  Blood in your stool.The stool may look dark red or black.  A change in bowel habits, such as constipation or diarrhea.  How is this diagnosed? This condition is diagnosed with a colonoscopy. This is a procedure that uses a lighted, flexible scope to look at the inside of your colon. How is this treated? Treatment for this condition involves removing any polyps that are found. Those polyps will then be tested for cancer. If cancer is found, your health care provider will talk to you about options for colon cancer treatment. Follow these instructions at home: Diet  Eat plenty of fiber, such as fruits, vegetables, and whole grains.  Eat foods that are high in calcium and vitamin D, such as milk, cheese, yogurt, eggs, liver, fish, and broccoli.  Limit foods high in fat, red meats, and processed meats, such as hot dogs, sausage, bacon, and lunch meats.  Maintain a healthy weight, or lose weight if recommended by your health care provider. General instructions  Do not smoke cigarettes.  Do not drink alcohol excessively.  Keep all follow-up visits as told by your health care provider. This is important. This includes keeping regularly scheduled colonoscopies. Talk to your health care provider about when you need a  colonoscopy.  Exercise every day or as told by your health care provider. Contact a health care provider if:  You have new or worsening bleeding during a bowel movement.  You have new or increased blood in your stool.  You have a change in bowel habits.  You unexpectedly lose weight. This information is not intended to replace advice given to you by your health care provider. Make sure you discuss any questions you have with your health care provider. Document Released: 02/16/2004 Document Revised: 10/28/2015 Document Reviewed: 04/12/2015 Elsevier Interactive Patient Education  [employer].

## 2017-06-01 NOTE — H&P (Addendum)
Joseph Greer is an 61 y.o. male.   Chief Complaint: Patient is here for colonoscopy. HPI: Patient is 61 year old Caucasian male who has history of colonic adenomas and is here for surveillance colonoscopy.  His last exam was in 201i.  He denies abdominal pain change in bowel habits or rectal bleeding.  He states his weight has leveled off.  His weight dropped to 110 pounds before liver transplant in August 2009. Family history is negative for CRC.  Past Medical History:  Diagnosis Date  . Anxiety   . Depression   . Hepatitis C treated with SVR.   Marland Kitchen Hypertension   . Psoriasis   . Status post liver transplant [facility])     Past Surgical History:  Procedure Laterality Date  . COLONOSCOPY    . HERNIA REPAIR Right   . LIVER BIOPSY    . LIVER SURGERY    . UPPER GASTROINTESTINAL ENDOSCOPY      Family History  Problem Relation Age of Onset  . Diabetes Mother   . Heart disease Father   . Heart disease Son    Social History:  reports that he quit smoking about 9 years ago. His smoking use included cigarettes. He smoked 1.00 pack per day. he has never used smokeless tobacco. He reports that he does not drink alcohol or use drugs.  Allergies: No Known Allergies  Medications Prior to Admission  Medication Sig Dispense Refill  . buPROPion (WELLBUTRIN) 75 MG tablet Take 75 mg by mouth 2 (two) times daily.     Marland Kitchen FLUoxetine (PROZAC) 40 MG capsule Take 40 mg by mouth daily.    Marland Kitchen lisinopril (PRINIVIL,ZESTRIL) 20 MG tablet Take 20 mg by mouth daily.    . mycophenolate (CELLCEPT) 250 MG capsule Take 250 mg by mouth 2 (two) times daily.     . nitroGLYCERIN (NITROSTAT) 0.4 MG SL tablet Place 1 tablet (0.4 mg total) under the tongue every 5 (five) minutes x 3 doses as needed for chest pain. 30 tablet 12  . tacrolimus (PROGRAF) 1 MG capsule Take 2 mg by mouth 2 (two) times daily.     Marland Kitchen triamcinolone ointment (KENALOG) 0.1 % Apply 1 application topically 2 (two) times daily.    Marland Kitchen aspirin EC 81  MG EC tablet Take 1 tablet (81 mg total) by mouth daily. (Patient not taking: Reported on 05/21/2017) 30 tablet 3  . metoprolol tartrate (LOPRESSOR) 12.5 mg TABS tablet Take 0.5 tablets (12.5 mg total) by mouth 2 (two) times daily. (Patient not taking: Reported on 05/21/2017) 30 tablet 3    No results found for this or any previous visit (from the past 48 hour(s)). No results found.  ROS  Blood pressure (!) 150/89, temperature 98.1 F (36.7 C), resp. rate (!) 28, SpO2 97 %. Physical Exam  Constitutional: He appears well-developed and well-nourished.  HENT:  Mouth/Throat: Oropharynx is clear and moist.  Eyes: Conjunctivae are normal.  Neck: No thyromegaly present.  Cardiovascular: Normal rate, regular rhythm and normal heart sounds.  No murmur heard. Respiratory: Effort normal and breath sounds normal.  GI:  Abdomen is symmetrical with long scar in right upper quadrant and epigastric region.  Abdomen is soft and nontender without organomegaly or masses.  Musculoskeletal: He exhibits no edema.  Neurological: He is alert.  Skin: Skin is warm.     Assessment/Plan History of colonic adenomas. Surveillance colonoscopy.  Hildred Laser, MD 06/01/2017, 7:20 AM

## 2017-06-01 NOTE — Op Note (Signed)
[facility] Patient Name: Joseph Greer Procedure Date: 06/01/2017 7:22 AM MRN: [id number] Date of Birth: 1956-05-02 Attending MD: Hildred Laser , MD CSN: [id number] Age: 61 Admit Type: Ambulatory Procedure:                Colonoscopy Indications:              High risk colon cancer surveillance: Personal                            history of colonic polyps Providers:                Hildred Laser, MD, Lurline Del, RN, Rosina Lowenstein, RN Referring MD:             Monico Blitz, MD Medicines:                Propofol per Anesthesia Complications:            No immediate complications. Estimated Blood Loss:     Estimated blood loss was minimal. Procedure:                Pre-Anesthesia Assessment:                           - Prior to the procedure, a History and Physical                            was performed, and patient medications and                            allergies were reviewed. The patient's tolerance of                            previous anesthesia was also reviewed. The risks                            and benefits of the procedure and the sedation                            options and risks were discussed with the patient.                            All questions were answered, and informed consent                            was obtained. Prior Anticoagulants: The patient has                            taken no previous anticoagulant or antiplatelet                            agents. ASA Grade Assessment: II - A patient with                            mild systemic disease. After reviewing the risks  and benefits, the patient was deemed in                            satisfactory condition to undergo the procedure.                           After obtaining informed consent, the colonoscope                            was passed under direct vision. Throughout the                            procedure, the patient's blood pressure, pulse, and                             oxygen saturations were monitored continuously. The                            EC-[id number] ([id number]) scope was introduced through                            the anus and advanced to the the cecum, identified                            by appendiceal orifice and ileocecal valve. The                            colonoscopy was performed without difficulty. The                            patient tolerated the procedure well. The quality                            of the bowel preparation was adequate to identify                            polyps. The ileocecal valve, appendiceal orifice,                            and rectum were photographed. Scope In: 7:39:19 AM Scope Out: 8:12:47 AM Scope Withdrawal Time: 0 hours 26 minutes 48 seconds  Total Procedure Duration: 0 hours 33 minutes 28 seconds  Findings:      The perianal and digital rectal examinations were normal.      Four sessile polyps were found in the sigmoid colon, hepatic flexure and       ascending colon. The polyps were small in size. These were biopsied with       a cold forceps for histology. The pathology specimen was placed into       Bottle Number 1.      The exam was otherwise normal throughout the examined colon.      The retroflexed view of the distal rectum and anal verge was normal and       showed no anal or rectal abnormalities. Impression:               -  Four small polyps in the sigmoid colon, at the                            hepatic flexure and in the ascending colon.                            Biopsied. Moderate Sedation:      Per Anesthesia Care Recommendation:           - Patient has a contact number available for                            emergencies. The signs and symptoms of potential                            delayed complications were discussed with the                            patient. Return to normal activities tomorrow.                            Written discharge  instructions were provided to the                            patient.                           - Resume previous diet today.                           - Continue present medications.                           - No aspirin, ibuprofen, naproxen, or other                            non-steroidal anti-inflammatory drugs for 1 day.                           - Await pathology results.                           - Repeat colonoscopy is recommended for                            surveillance. The colonoscopy date will be                            determined after pathology results from today's                            exam become available for review. Procedure Code(s):        --- Professional ---                           [id number], Colonoscopy, flexible; with biopsy, single  or multiple Diagnosis Code(s):        --- Professional ---                           Z86.010, Personal history of colonic polyps                           D12.5, Benign neoplasm of sigmoid colon                           D12.3, Benign neoplasm of transverse colon (hepatic                            flexure or splenic flexure)                           D12.2, Benign neoplasm of ascending colon CPT copyright 2016 [employer]. All rights reserved. The codes documented in this report are preliminary and upon coder review may  be revised to meet current compliance requirements. Hildred Laser, MD Hildred Laser, MD 06/01/2017 8:26:22 AM This report has been signed electronically. Number of Addenda: 0

## 2017-06-06 ENCOUNTER — Encounter (HOSPITAL_COMMUNITY): Payer: Self-pay | Admitting: Internal Medicine

## 2017-06-12 DIAGNOSIS — Z1339 Encounter for screening examination for other mental health and behavioral disorders: Secondary | ICD-10-CM | POA: Diagnosis not present

## 2017-06-12 DIAGNOSIS — Z7189 Other specified counseling: Secondary | ICD-10-CM | POA: Diagnosis not present

## 2017-06-12 DIAGNOSIS — K7460 Unspecified cirrhosis of liver: Secondary | ICD-10-CM | POA: Diagnosis not present

## 2017-06-12 DIAGNOSIS — Z6832 Body mass index (BMI) 32.0-32.9, adult: Secondary | ICD-10-CM | POA: Diagnosis not present

## 2017-06-12 DIAGNOSIS — Z1331 Encounter for screening for depression: Secondary | ICD-10-CM | POA: Diagnosis not present

## 2017-06-12 DIAGNOSIS — Z0000 Encounter for general adult medical examination without abnormal findings: Secondary | ICD-10-CM | POA: Diagnosis not present

## 2017-06-12 DIAGNOSIS — Z944 Liver transplant status: Secondary | ICD-10-CM | POA: Diagnosis not present

## 2017-06-12 DIAGNOSIS — E7800 Pure hypercholesterolemia, unspecified: Secondary | ICD-10-CM | POA: Diagnosis not present

## 2017-06-12 DIAGNOSIS — Z299 Encounter for prophylactic measures, unspecified: Secondary | ICD-10-CM | POA: Diagnosis not present

## 2017-06-12 DIAGNOSIS — I10 Essential (primary) hypertension: Secondary | ICD-10-CM | POA: Diagnosis not present

## 2017-07-10 DIAGNOSIS — H4311 Vitreous hemorrhage, right eye: Secondary | ICD-10-CM | POA: Diagnosis not present

## 2017-07-16 NOTE — Progress Notes (Signed)
[facility] Note  07/17/2017     CHIEF COMPLAINT Patient presents for Retina Evaluation   HISTORY OF PRESENT ILLNESS: Joseph Greer is a 62 y.o. male who presents to the clinic today for:   HPI    Retina Evaluation    In both eyes.  This started 3 weeks ago.  Associated Symptoms Floaters.  Negative for Flashes, Pain, Trauma, Fever, Weight Loss, Scalp Tenderness, Redness, Distortion, Photophobia, Jaw Claudication, Fatigue, Shoulder/Hip pain, Glare and Blind Spot.  Context:  distance vision, mid-range vision, near vision and watching TV.  Treatments tried include no treatments.  I, the attending physician,  performed the HPI with the patient and updated documentation appropriately.          Comments    Referral of Dr. Abigail Miyamoto for retina evaluation/retina hem.  . Patient states "about three weeks ago he noticed something like a fine hair floating around  in his right eye  , then his vision become blurry and cloudy. Denies flashes, glare and ocular pain. Denies gtts/vits       Last edited by Bernarda Caffey, MD on 07/17/2017 10:44 AM. (History)    Pt states he saw Dr. Radford Pax and was told "something might be torn" OD; Pt states he went to see Dr. Radford Pax for a "cloud floating around" x 3 weeks; Pt denies noting any flashes; Pt reports "cloud" has not grown or reduced in size since it began 3 weeks ago; Pt states the cloud appears black in color; Pt denies any previous eye "issues" in past, denies any ocular surgeries;   Referring physician: Particia Nearing, [place] [facility] [address] [place], [place] [address]  HISTORICAL INFORMATION:   Selected notes from the MEDICAL RECORD NUMBER Referred by Dr. Abigail Miyamoto for concern of VH OD? LEE- 02.05.19 ([facility]) [BCVA OD: 20/50 OS: 20/25] Ocular Hx- cataract OD? PMH- HTN, liver transplant    CURRENT MEDICATIONS: No current outpatient medications on file. (Ophthalmic Drugs)   No current facility-administered  medications for this visit.  (Ophthalmic Drugs)   Current Outpatient Medications (Other)  Medication Sig  . buPROPion (WELLBUTRIN) 75 MG tablet Take 75 mg by mouth 2 (two) times daily.   Marland Kitchen FLUoxetine (PROZAC) 40 MG capsule Take 40 mg by mouth daily.  Marland Kitchen lisinopril (PRINIVIL,ZESTRIL) 20 MG tablet Take 20 mg by mouth daily.  . mycophenolate (CELLCEPT) 250 MG capsule Take 250 mg by mouth 2 (two) times daily.   . nitroGLYCERIN (NITROSTAT) 0.4 MG SL tablet Place 1 tablet (0.4 mg total) under the tongue every 5 (five) minutes x 3 doses as needed for chest pain.  Marland Kitchen tacrolimus (PROGRAF) 1 MG capsule Take 2 mg by mouth 2 (two) times daily.   Marland Kitchen triamcinolone ointment (KENALOG) 0.1 % Apply 1 application topically 2 (two) times daily.   No current facility-administered medications for this visit.  (Other)      REVIEW OF SYSTEMS: ROS    Positive for: Eyes   Negative for: Constitutional, Gastrointestinal, Neurological, Skin, Genitourinary, Musculoskeletal, HENT, Endocrine, Cardiovascular, Respiratory, Psychiatric, Allergic/Imm, Heme/Lymph   Last edited by Zenovia Jordan, LPN on 9/44/9675  9:16 AM. (History)       ALLERGIES No Known Allergies  PAST MEDICAL HISTORY Past Medical History:  Diagnosis Date  . Anxiety   . Depression   . Hepatitis C   . Hypertension   . Psoriasis   . Status post liver transplant [facility])    Past Surgical History:  Procedure  Laterality Date  . COLONOSCOPY    . COLONOSCOPY WITH PROPOFOL N/A 06/01/2017   Procedure: COLONOSCOPY WITH PROPOFOL;  Surgeon: Rogene Houston, MD;  [facility];  Service: Endoscopy;  Laterality: N/A;  7:30  . HERNIA REPAIR Right   . LIVER BIOPSY    . LIVER SURGERY    . POLYPECTOMY  06/01/2017   Procedure: POLYPECTOMY;  Surgeon: Rogene Houston, MD;  Location: [facility];  Service: Endoscopy;;  polyp at sigmoid colon x2, ascending colon polyp, hepatic flexure polyp  . UPPER GASTROINTESTINAL ENDOSCOPY      FAMILY  HISTORY Family History  Problem Relation Age of Onset  . Diabetes Mother   . Heart disease Father   . Heart disease Son     SOCIAL HISTORY Social History   Tobacco Use  . Smoking status: Former Smoker    Packs/day: 1.00    Types: Cigarettes    Last attempt to quit: 09/04/2007    Years since quitting: 9.8  . Smokeless tobacco: Never Used  Substance Use Topics  . Alcohol use: No  . Drug use: No         OPHTHALMIC EXAM:  Base Eye Exam    Visual Acuity (Snellen - Linear)      Right Left   Dist [place] 20/40 20/25 +2   Dist ph Start 20/25 NI       Tonometry (Tonopen, 10:11 AM)      Right Left   Pressure 18 21       Tonometry #2 (Tonopen, 10:11 AM)      Right Left   Pressure  19       Pupils      Dark Light Shape React APD   Right 6 6 Round NR None   Left 6 6 Round NR None       Visual Fields (Counting fingers)      Left Right    Full Full       Extraocular Movement      Right Left    Full, Ortho Full, Ortho       Neuro/Psych    Oriented x3:  Yes   Mood/Affect:  Normal       Dilation    Both eyes:  1.0% Mydriacyl, 2.5% Phenylephrine @ 10:11 AM        Slit Lamp and Fundus Exam    Slit Lamp Exam      Right Left   Lids/Lashes Dermatochalasis - upper lid, Telangiectasia, Scurf Dermatochalasis - upper lid, Telangiectasia, Scurf   Conjunctiva/Sclera White and quiet White and quiet   Cornea Arcus, otherwise clear Arcus, otherwise clear   Anterior Chamber Deep and quiet Deep and quiet   Iris Round and widlly dilated Round and widlly dilated   Lens 2+ Nuclear sclerosis, 2+ Cortical cataract 2+ Nuclear sclerosis, 2+ Cortical cataract   Vitreous Vitreous syneresis, Posterior vitreous detachment; horizontal blood stained vitreous floater centrally Vitreous syneresis       Fundus Exam      Right Left   Disc Normal Normal   C/D Ratio 0.35 0.35   Macula Horizontal Blood stained vitreous over macula, RPE mottling and clumping, No heme or edema Flat, RPE mottling  and clumping   Vessels AV crossing changes, mild Copper wiring AV crossing changes, mild Copper wiring   Periphery Attached; no RT/RD on 360 scleral depressed exam; peripheral cystoid degeneration Attached        Refraction    Manifest Refraction  Sphere Cylinder Axis Dist VA   Right -0.50 +1.00 015 20/25   Left -0.50 Sphere  20/20-1          IMAGING AND PROCEDURES  Imaging and Procedures for 07/17/17  OCT, Retina - OU - Both Eyes     Right Eye Quality was good. Central Foveal Thickness: 283. Progression has no prior data. Findings include normal foveal contour, no IRF, no SRF, retinal drusen , outer retinal atrophy (Focal areas of ORA/ellipsoid disruption).   Left Eye Quality was good. Central Foveal Thickness: 282. Progression has no prior data. Findings include normal foveal contour, no IRF, no SRF, vitreomacular adhesion , outer retinal atrophy, retinal drusen  (Focal areas of ORA and ellipsoid disruption).   Notes *Images captured and stored on drive  Diagnosis / Impression:  Rare drusen and outer retinal and RPE disruption consistent with mild nonexudative ARMD  Clinical management:  See below  Abbreviations: NFP - Normal foveal profile. CME - cystoid macular edema. PED - pigment epithelial detachment. IRF - intraretinal fluid. SRF - subretinal fluid. EZ - ellipsoid zone. ERM - epiretinal membrane. ORA - outer retinal atrophy. ORT - outer retinal tubulation. SRHM - subretinal hyper-reflective material                  ASSESSMENT/PLAN:    ICD-10-CM   1. Posterior vitreous detachment of right eye H43.811   2. Early dry stage nonexudative age-related macular degeneration of both eyes H35.3131   3. Retinal edema H35.81 OCT, Retina - OU - Both Eyes  4. Combined form of age-related cataract, both eyes H25.813     1. PVD / vitreous syneresis with mild vitreous hemorrhage OD  Discussed findings and prognosis  No RT or RD on 360 scleral depressed  exam  Reviewed s/s of RT/RD  Strict return precautions for any such RT/RD signs/symptoms  F/U 1-2 weeks  2. Age related macular degeneration, non-exudative, both eyes  - very mild drusen / early RPE changes OU  - The incidence, anatomy, and pathology of dry AMD, risk of progression, and the AREDS and AREDS 2 study including smoking risks discussed with patient.  - Recommend amsler grid monitoring  - recheck in ~6 mos (01/2018)  3. No retinal edema on exam or OCT   4. Combined form age-related cataract OU-  - The symptoms of cataract, surgical options, and treatments and risks were discussed with patient. - discussed diagnosis and progression - not yet visually significant - monitor for now   Ophthalmic Meds Ordered this visit:  No orders of the defined types were placed in this encounter.      Return in about 2 weeks (around 07/31/2017) for F/U PVD OD.  There are no Patient Instructions on file for this visit.   Explained the diagnoses, plan, and follow up with the patient and they expressed understanding.  Patient expressed understanding of the importance of proper follow up care.   This document serves as a record of services personally performed by Gardiner Sleeper, MD, PhD. It was created on their behalf by Catha Brow, [place], a certified ophthalmic assistant. The creation of this record is the provider's dictation and/or activities during the visit.  Electronically signed by: Catha Brow, COA  07/17/17 1:12 PM    Gardiner Sleeper, M.D., Ph.D. Diseases & Surgery of the Retina and [place] 07/17/17   I have reviewed the above documentation for accuracy and completeness, and I agree with the above. Aaron Edelman  Antony Haste, M.D., Ph.D. 07/17/17 1:12 PM     Abbreviations: M myopia (nearsighted); A astigmatism; H hyperopia (farsighted); P presbyopia; Mrx spectacle prescription;  CTL contact lenses; OD right eye; OS left eye; OU both eyes   XT exotropia; ET esotropia; PEK punctate epithelial keratitis; PEE punctate epithelial erosions; DES dry eye syndrome; MGD meibomian gland dysfunction; ATs artificial tears; PFAT's preservative free artificial tears; [place] nuclear sclerotic cataract; PSC posterior subcapsular cataract; ERM epi-retinal membrane; PVD posterior vitreous detachment; RD retinal detachment; DM diabetes mellitus; DR diabetic retinopathy; NPDR non-proliferative diabetic retinopathy; PDR proliferative diabetic retinopathy; CSME clinically significant macular edema; DME diabetic macular edema; dbh dot blot hemorrhages; CWS cotton wool spot; POAG primary open angle glaucoma; C/D cup-to-disc ratio; HVF humphrey visual field; GVF goldmann visual field; OCT optical coherence tomography; IOP intraocular pressure; BRVO Branch retinal vein occlusion; CRVO central retinal vein occlusion; CRAO central retinal artery occlusion; BRAO branch retinal artery occlusion; RT retinal tear; SB scleral buckle; PPV pars plana vitrectomy; VH Vitreous hemorrhage; PRP panretinal laser photocoagulation; IVK intravitreal kenalog; VMT vitreomacular traction; MH Macular hole;  NVD neovascularization of the disc; NVE neovascularization elsewhere; AREDS age related eye disease study; ARMD age related macular degeneration; POAG primary open angle glaucoma; EBMD epithelial/anterior basement membrane dystrophy; ACIOL anterior chamber intraocular lens; IOL intraocular lens; PCIOL posterior chamber intraocular lens; Phaco/IOL phacoemulsification with intraocular lens placement; [place] photorefractive keratectomy; LASIK laser assisted in situ keratomileusis; HTN hypertension; DM diabetes mellitus; COPD chronic obstructive pulmonary disease

## 2017-07-17 ENCOUNTER — Encounter (INDEPENDENT_AMBULATORY_CARE_PROVIDER_SITE_OTHER): Payer: Self-pay | Admitting: Ophthalmology

## 2017-07-17 ENCOUNTER — Ambulatory Visit (INDEPENDENT_AMBULATORY_CARE_PROVIDER_SITE_OTHER): Payer: Medicare HMO | Admitting: Ophthalmology

## 2017-07-17 DIAGNOSIS — H353131 Nonexudative age-related macular degeneration, bilateral, early dry stage: Secondary | ICD-10-CM

## 2017-07-17 DIAGNOSIS — H25813 Combined forms of age-related cataract, bilateral: Secondary | ICD-10-CM

## 2017-07-17 DIAGNOSIS — H43811 Vitreous degeneration, right eye: Secondary | ICD-10-CM

## 2017-07-17 DIAGNOSIS — H3581 Retinal edema: Secondary | ICD-10-CM | POA: Diagnosis not present

## 2017-07-27 NOTE — Progress Notes (Deleted)
[facility] Note  07/31/2017     CHIEF COMPLAINT Patient presents for No chief complaint on file.   HISTORY OF PRESENT ILLNESS: Joseph Greer is a 62 y.o. male who presents to the clinic today for:   Pt states he saw Dr. Radford Pax and was told "something might be torn" OD; Pt states he went to see Dr. Radford Pax for a "cloud floating around" x 3 weeks; Pt denies noting any flashes; Pt reports "cloud" has not grown or reduced in size since it began 3 weeks ago; Pt states the cloud appears black in color; Pt denies any previous eye "issues" in past, denies any ocular surgeries;   Referring physician: Monico Blitz, MD [place], [place] [address]  HISTORICAL INFORMATION:   Selected notes from the MEDICAL RECORD NUMBER Referred by Dr. Abigail Miyamoto for concern of VH OD? LEE- 02.05.19 ([facility]) [BCVA OD: 20/50 OS: 20/25] Ocular Hx- cataract OD? PMH- HTN, liver transplant    CURRENT MEDICATIONS: No current outpatient medications on file. (Ophthalmic Drugs)   No current facility-administered medications for this visit.  (Ophthalmic Drugs)   Current Outpatient Medications (Other)  Medication Sig   buPROPion (WELLBUTRIN) 75 MG tablet Take 75 mg by mouth 2 (two) times daily.    FLUoxetine (PROZAC) 40 MG capsule Take 40 mg by mouth daily.   lisinopril (PRINIVIL,ZESTRIL) 20 MG tablet Take 20 mg by mouth daily.   mycophenolate (CELLCEPT) 250 MG capsule Take 250 mg by mouth 2 (two) times daily.    nitroGLYCERIN (NITROSTAT) 0.4 MG SL tablet Place 1 tablet (0.4 mg total) under the tongue every 5 (five) minutes x 3 doses as needed for chest pain.   tacrolimus (PROGRAF) 1 MG capsule Take 2 mg by mouth 2 (two) times daily.    triamcinolone ointment (KENALOG) 0.1 % Apply 1 application topically 2 (two) times daily.   No current facility-administered medications for this visit.  (Other)      REVIEW OF SYSTEMS:    ALLERGIES No Known Allergies  PAST  MEDICAL HISTORY Past Medical History:  Diagnosis Date   Anxiety    Depression    Hepatitis C    Hypertension    Psoriasis    Status post liver transplant ([place])    Past Surgical History:  Procedure Laterality Date   COLONOSCOPY     COLONOSCOPY WITH PROPOFOL N/A 06/01/2017   Procedure: COLONOSCOPY WITH PROPOFOL;  Surgeon: Rogene Houston, MD;  [facility];  Service: Endoscopy;  Laterality: N/A;  7:30   HERNIA REPAIR Right    LIVER BIOPSY     LIVER SURGERY     POLYPECTOMY  06/01/2017   Procedure: POLYPECTOMY;  Surgeon: Rogene Houston, MD;  [facility];  Service: Endoscopy;;  polyp at sigmoid colon x2, ascending colon polyp, hepatic flexure polyp   UPPER GASTROINTESTINAL ENDOSCOPY      FAMILY HISTORY Family History  Problem Relation Age of Onset   Diabetes Mother    Heart disease Father    Heart disease Son     SOCIAL HISTORY Social History   Tobacco Use   Smoking status: Former Smoker    Packs/day: 1.00    Types: Cigarettes    Last attempt to quit: 09/04/2007    Years since quitting: 9.9   Smokeless tobacco: Never Used  Substance Use Topics   Alcohol use: No   Drug use: No         OPHTHALMIC EXAM:  Not recorded      IMAGING AND PROCEDURES  Imaging and Procedures for 07/27/17           ASSESSMENT/PLAN:    ICD-10-CM   1. Posterior vitreous detachment of right eye H43.811   2. Early dry stage nonexudative age-related macular degeneration of both eyes H35.3131   3. Retinal edema H35.81 OCT, Retina - OU - Both Eyes  4. Combined form of age-related cataract, both eyes H25.813     1. PVD / vitreous syneresis with mild vitreous hemorrhage OD  Discussed findings and prognosis  No RT or RD on 360 scleral depressed exam  Reviewed s/s of RT/RD  Strict return precautions for any such RT/RD signs/symptoms  F/U 1-2 weeks  2. Age related macular degeneration, non-exudative, both eyes  - very mild drusen /  early RPE changes OU  - The incidence, anatomy, and pathology of dry AMD, risk of progression, and the AREDS and AREDS 2 study including smoking risks discussed with patient.  - Recommend amsler grid monitoring  - recheck in ~6 mos (01/2018)  3. No retinal edema on exam or OCT   4. Combined form age-related cataract OU-  - The symptoms of cataract, surgical options, and treatments and risks were discussed with patient. - discussed diagnosis and progression - not yet visually significant - monitor for now   Ophthalmic Meds Ordered this visit:  No orders of the defined types were placed in this encounter.      No Follow-up on file.  There are no Patient Instructions on file for this visit.   Explained the diagnoses, plan, and follow up with the patient and they expressed understanding.  Patient expressed understanding of the importance of proper follow up care.   This document serves as a record of services personally performed by Gardiner Sleeper, MD, PhD. It was created on their behalf by Catha Brow, [place], a certified ophthalmic assistant. The creation of this record is the provider's dictation and/or activities during the visit.  Electronically signed by: Catha Brow, [place]  07/27/17 11:50 AM    Gardiner Sleeper, M.D., Ph.D. Diseases & Surgery of the Retina and Vitreous [facility] 07/27/17      Abbreviations: M myopia (nearsighted); A astigmatism; H hyperopia (farsighted); P presbyopia; Mrx spectacle prescription;  CTL contact lenses; OD right eye; OS left eye; OU both eyes  XT exotropia; ET esotropia; PEK punctate epithelial keratitis; PEE punctate epithelial erosions; DES dry eye syndrome; MGD meibomian gland dysfunction; ATs artificial tears; PFAT's preservative free artificial tears; [place] nuclear sclerotic cataract; PSC posterior subcapsular cataract; ERM epi-retinal membrane; PVD posterior vitreous detachment; RD retinal detachment; DM  diabetes mellitus; DR diabetic retinopathy; NPDR non-proliferative diabetic retinopathy; PDR proliferative diabetic retinopathy; CSME clinically significant macular edema; DME diabetic macular edema; dbh dot blot hemorrhages; CWS cotton wool spot; POAG primary open angle glaucoma; C/D cup-to-disc ratio; HVF humphrey visual field; GVF goldmann visual field; OCT optical coherence tomography; IOP intraocular pressure; BRVO Branch retinal vein occlusion; CRVO central retinal vein occlusion; CRAO central retinal artery occlusion; BRAO branch retinal artery occlusion; RT retinal tear; SB scleral buckle; PPV pars plana vitrectomy; VH Vitreous hemorrhage; PRP panretinal laser photocoagulation; IVK intravitreal kenalog; VMT vitreomacular traction; MH Macular hole;  NVD neovascularization of the disc; NVE neovascularization elsewhere; AREDS age related eye disease study; ARMD age related macular degeneration; POAG primary open angle glaucoma; EBMD epithelial/anterior basement membrane dystrophy; ACIOL anterior chamber intraocular lens; IOL intraocular lens; PCIOL posterior chamber intraocular  lens; Phaco/IOL phacoemulsification with intraocular lens placement; [place] photorefractive keratectomy; LASIK laser assisted in situ keratomileusis; HTN hypertension; DM diabetes mellitus; COPD chronic obstructive pulmonary disease

## 2017-07-31 ENCOUNTER — Encounter (INDEPENDENT_AMBULATORY_CARE_PROVIDER_SITE_OTHER): Payer: Medicare HMO | Admitting: Ophthalmology

## 2017-08-28 DIAGNOSIS — Z944 Liver transplant status: Secondary | ICD-10-CM | POA: Diagnosis not present

## 2017-09-07 DIAGNOSIS — Z944 Liver transplant status: Secondary | ICD-10-CM | POA: Diagnosis not present

## 2017-09-07 DIAGNOSIS — Z299 Encounter for prophylactic measures, unspecified: Secondary | ICD-10-CM | POA: Diagnosis not present

## 2017-09-07 DIAGNOSIS — Z87891 Personal history of nicotine dependence: Secondary | ICD-10-CM | POA: Diagnosis not present

## 2017-09-07 DIAGNOSIS — R69 Illness, unspecified: Secondary | ICD-10-CM | POA: Diagnosis not present

## 2017-09-07 DIAGNOSIS — R030 Elevated blood-pressure reading, without diagnosis of hypertension: Secondary | ICD-10-CM | POA: Diagnosis not present

## 2017-09-07 DIAGNOSIS — K7460 Unspecified cirrhosis of liver: Secondary | ICD-10-CM | POA: Diagnosis not present

## 2017-09-07 DIAGNOSIS — Z6832 Body mass index (BMI) 32.0-32.9, adult: Secondary | ICD-10-CM | POA: Diagnosis not present

## 2017-09-25 DIAGNOSIS — Z79899 Other long term (current) drug therapy: Secondary | ICD-10-CM | POA: Diagnosis not present

## 2017-09-25 DIAGNOSIS — Z7722 Contact with and (suspected) exposure to environmental tobacco smoke (acute) (chronic): Secondary | ICD-10-CM | POA: Diagnosis not present

## 2017-09-25 DIAGNOSIS — K08409 Partial loss of teeth, unspecified cause, unspecified class: Secondary | ICD-10-CM | POA: Diagnosis not present

## 2017-09-25 DIAGNOSIS — G629 Polyneuropathy, unspecified: Secondary | ICD-10-CM | POA: Diagnosis not present

## 2017-09-25 DIAGNOSIS — Z944 Liver transplant status: Secondary | ICD-10-CM | POA: Diagnosis not present

## 2017-09-25 DIAGNOSIS — R69 Illness, unspecified: Secondary | ICD-10-CM | POA: Diagnosis not present

## 2017-09-25 DIAGNOSIS — N529 Male erectile dysfunction, unspecified: Secondary | ICD-10-CM | POA: Diagnosis not present

## 2017-09-25 DIAGNOSIS — I10 Essential (primary) hypertension: Secondary | ICD-10-CM | POA: Diagnosis not present

## 2017-09-25 DIAGNOSIS — L409 Psoriasis, unspecified: Secondary | ICD-10-CM | POA: Diagnosis not present

## 2017-10-23 DIAGNOSIS — C44519 Basal cell carcinoma of skin of other part of trunk: Secondary | ICD-10-CM | POA: Diagnosis not present

## 2017-10-23 DIAGNOSIS — C44529 Squamous cell carcinoma of skin of other part of trunk: Secondary | ICD-10-CM | POA: Diagnosis not present

## 2017-11-13 DIAGNOSIS — Z299 Encounter for prophylactic measures, unspecified: Secondary | ICD-10-CM | POA: Diagnosis not present

## 2017-11-13 DIAGNOSIS — Z6832 Body mass index (BMI) 32.0-32.9, adult: Secondary | ICD-10-CM | POA: Diagnosis not present

## 2017-11-13 DIAGNOSIS — L4050 Arthropathic psoriasis, unspecified: Secondary | ICD-10-CM | POA: Diagnosis not present

## 2017-11-13 DIAGNOSIS — I10 Essential (primary) hypertension: Secondary | ICD-10-CM | POA: Diagnosis not present

## 2017-11-13 DIAGNOSIS — Z87891 Personal history of nicotine dependence: Secondary | ICD-10-CM | POA: Diagnosis not present

## 2017-11-13 DIAGNOSIS — K219 Gastro-esophageal reflux disease without esophagitis: Secondary | ICD-10-CM | POA: Diagnosis not present

## 2017-11-13 DIAGNOSIS — K7460 Unspecified cirrhosis of liver: Secondary | ICD-10-CM | POA: Diagnosis not present

## 2017-11-13 DIAGNOSIS — Z944 Liver transplant status: Secondary | ICD-10-CM | POA: Diagnosis not present

## 2017-12-03 ENCOUNTER — Ambulatory Visit (INDEPENDENT_AMBULATORY_CARE_PROVIDER_SITE_OTHER): Payer: Medicare Other | Admitting: Internal Medicine

## 2017-12-04 DIAGNOSIS — Z944 Liver transplant status: Secondary | ICD-10-CM | POA: Diagnosis not present

## 2017-12-26 DIAGNOSIS — D2260 Melanocytic nevi of unspecified upper limb, including shoulder: Secondary | ICD-10-CM | POA: Diagnosis not present

## 2017-12-26 DIAGNOSIS — Z85828 Personal history of other malignant neoplasm of skin: Secondary | ICD-10-CM | POA: Diagnosis not present

## 2017-12-26 DIAGNOSIS — D899 Disorder involving the immune mechanism, unspecified: Secondary | ICD-10-CM | POA: Diagnosis not present

## 2017-12-26 DIAGNOSIS — D485 Neoplasm of uncertain behavior of skin: Secondary | ICD-10-CM | POA: Diagnosis not present

## 2017-12-26 DIAGNOSIS — D2270 Melanocytic nevi of unspecified lower limb, including hip: Secondary | ICD-10-CM | POA: Diagnosis not present

## 2017-12-26 DIAGNOSIS — L814 Other melanin hyperpigmentation: Secondary | ICD-10-CM | POA: Diagnosis not present

## 2017-12-26 DIAGNOSIS — D225 Melanocytic nevi of trunk: Secondary | ICD-10-CM | POA: Diagnosis not present

## 2017-12-26 DIAGNOSIS — L400 Psoriasis vulgaris: Secondary | ICD-10-CM | POA: Diagnosis not present

## 2018-01-25 DIAGNOSIS — R109 Unspecified abdominal pain: Secondary | ICD-10-CM | POA: Diagnosis not present

## 2018-01-25 DIAGNOSIS — T783XXA Angioneurotic edema, initial encounter: Secondary | ICD-10-CM | POA: Diagnosis not present

## 2018-01-25 DIAGNOSIS — I10 Essential (primary) hypertension: Secondary | ICD-10-CM | POA: Diagnosis not present

## 2018-01-25 DIAGNOSIS — Z299 Encounter for prophylactic measures, unspecified: Secondary | ICD-10-CM | POA: Diagnosis not present

## 2018-01-25 DIAGNOSIS — Z6832 Body mass index (BMI) 32.0-32.9, adult: Secondary | ICD-10-CM | POA: Diagnosis not present

## 2018-01-25 DIAGNOSIS — K7460 Unspecified cirrhosis of liver: Secondary | ICD-10-CM | POA: Diagnosis not present

## 2018-01-28 DIAGNOSIS — Z944 Liver transplant status: Secondary | ICD-10-CM | POA: Diagnosis not present

## 2018-01-28 DIAGNOSIS — Z299 Encounter for prophylactic measures, unspecified: Secondary | ICD-10-CM | POA: Diagnosis not present

## 2018-01-28 DIAGNOSIS — Z6832 Body mass index (BMI) 32.0-32.9, adult: Secondary | ICD-10-CM | POA: Diagnosis not present

## 2018-01-28 DIAGNOSIS — I10 Essential (primary) hypertension: Secondary | ICD-10-CM | POA: Diagnosis not present

## 2018-01-28 DIAGNOSIS — K7460 Unspecified cirrhosis of liver: Secondary | ICD-10-CM | POA: Diagnosis not present

## 2018-02-14 DIAGNOSIS — D899 Disorder involving the immune mechanism, unspecified: Secondary | ICD-10-CM | POA: Diagnosis not present

## 2018-02-14 DIAGNOSIS — Z944 Liver transplant status: Secondary | ICD-10-CM | POA: Diagnosis not present

## 2018-03-05 DIAGNOSIS — Z299 Encounter for prophylactic measures, unspecified: Secondary | ICD-10-CM | POA: Diagnosis not present

## 2018-03-05 DIAGNOSIS — Z6831 Body mass index (BMI) 31.0-31.9, adult: Secondary | ICD-10-CM | POA: Diagnosis not present

## 2018-03-05 DIAGNOSIS — R69 Illness, unspecified: Secondary | ICD-10-CM | POA: Diagnosis not present

## 2018-03-05 DIAGNOSIS — I10 Essential (primary) hypertension: Secondary | ICD-10-CM | POA: Diagnosis not present

## 2018-03-05 DIAGNOSIS — L4050 Arthropathic psoriasis, unspecified: Secondary | ICD-10-CM | POA: Diagnosis not present

## 2018-03-05 DIAGNOSIS — Z944 Liver transplant status: Secondary | ICD-10-CM | POA: Diagnosis not present

## 2018-03-18 DIAGNOSIS — L570 Actinic keratosis: Secondary | ICD-10-CM | POA: Diagnosis not present

## 2018-03-18 DIAGNOSIS — L409 Psoriasis, unspecified: Secondary | ICD-10-CM | POA: Diagnosis not present

## 2018-03-18 DIAGNOSIS — Z85828 Personal history of other malignant neoplasm of skin: Secondary | ICD-10-CM | POA: Diagnosis not present

## 2018-03-29 DIAGNOSIS — Z944 Liver transplant status: Secondary | ICD-10-CM | POA: Diagnosis not present

## 2018-03-29 DIAGNOSIS — D899 Disorder involving the immune mechanism, unspecified: Secondary | ICD-10-CM | POA: Diagnosis not present

## 2018-04-01 DIAGNOSIS — S335XXA Sprain of ligaments of lumbar spine, initial encounter: Secondary | ICD-10-CM | POA: Diagnosis not present

## 2018-04-01 DIAGNOSIS — M9903 Segmental and somatic dysfunction of lumbar region: Secondary | ICD-10-CM | POA: Diagnosis not present

## 2018-04-01 DIAGNOSIS — M47816 Spondylosis without myelopathy or radiculopathy, lumbar region: Secondary | ICD-10-CM | POA: Diagnosis not present

## 2018-04-04 DIAGNOSIS — S335XXA Sprain of ligaments of lumbar spine, initial encounter: Secondary | ICD-10-CM | POA: Diagnosis not present

## 2018-04-04 DIAGNOSIS — M47816 Spondylosis without myelopathy or radiculopathy, lumbar region: Secondary | ICD-10-CM | POA: Diagnosis not present

## 2018-04-04 DIAGNOSIS — M9903 Segmental and somatic dysfunction of lumbar region: Secondary | ICD-10-CM | POA: Diagnosis not present

## 2018-04-05 DIAGNOSIS — I10 Essential (primary) hypertension: Secondary | ICD-10-CM | POA: Diagnosis not present

## 2018-04-05 DIAGNOSIS — Z6831 Body mass index (BMI) 31.0-31.9, adult: Secondary | ICD-10-CM | POA: Diagnosis not present

## 2018-04-05 DIAGNOSIS — M48061 Spinal stenosis, lumbar region without neurogenic claudication: Secondary | ICD-10-CM | POA: Diagnosis not present

## 2018-04-05 DIAGNOSIS — R69 Illness, unspecified: Secondary | ICD-10-CM | POA: Diagnosis not present

## 2018-04-05 DIAGNOSIS — Z299 Encounter for prophylactic measures, unspecified: Secondary | ICD-10-CM | POA: Diagnosis not present

## 2018-04-08 DIAGNOSIS — M9903 Segmental and somatic dysfunction of lumbar region: Secondary | ICD-10-CM | POA: Diagnosis not present

## 2018-04-08 DIAGNOSIS — S335XXA Sprain of ligaments of lumbar spine, initial encounter: Secondary | ICD-10-CM | POA: Diagnosis not present

## 2018-04-08 DIAGNOSIS — M47816 Spondylosis without myelopathy or radiculopathy, lumbar region: Secondary | ICD-10-CM | POA: Diagnosis not present

## 2018-04-10 DIAGNOSIS — Z9889 Other specified postprocedural states: Secondary | ICD-10-CM | POA: Diagnosis not present

## 2018-04-10 DIAGNOSIS — M4696 Unspecified inflammatory spondylopathy, lumbar region: Secondary | ICD-10-CM | POA: Diagnosis not present

## 2018-04-29 DIAGNOSIS — K7460 Unspecified cirrhosis of liver: Secondary | ICD-10-CM | POA: Diagnosis not present

## 2018-04-29 DIAGNOSIS — I10 Essential (primary) hypertension: Secondary | ICD-10-CM | POA: Diagnosis not present

## 2018-04-29 DIAGNOSIS — Z6831 Body mass index (BMI) 31.0-31.9, adult: Secondary | ICD-10-CM | POA: Diagnosis not present

## 2018-04-29 DIAGNOSIS — Z299 Encounter for prophylactic measures, unspecified: Secondary | ICD-10-CM | POA: Diagnosis not present

## 2018-04-29 DIAGNOSIS — R69 Illness, unspecified: Secondary | ICD-10-CM | POA: Diagnosis not present

## 2018-05-10 DIAGNOSIS — M47816 Spondylosis without myelopathy or radiculopathy, lumbar region: Secondary | ICD-10-CM | POA: Diagnosis not present

## 2018-05-10 DIAGNOSIS — Z9889 Other specified postprocedural states: Secondary | ICD-10-CM | POA: Diagnosis not present

## 2018-06-11 DIAGNOSIS — D899 Disorder involving the immune mechanism, unspecified: Secondary | ICD-10-CM | POA: Diagnosis not present

## 2018-06-11 DIAGNOSIS — Z944 Liver transplant status: Secondary | ICD-10-CM | POA: Diagnosis not present

## 2018-06-11 DIAGNOSIS — R768 Other specified abnormal immunological findings in serum: Secondary | ICD-10-CM | POA: Diagnosis not present

## 2018-06-11 DIAGNOSIS — Z298 Encounter for other specified prophylactic measures: Secondary | ICD-10-CM | POA: Diagnosis not present

## 2018-06-13 DIAGNOSIS — Z944 Liver transplant status: Secondary | ICD-10-CM | POA: Diagnosis not present

## 2018-06-13 DIAGNOSIS — D899 Disorder involving the immune mechanism, unspecified: Secondary | ICD-10-CM | POA: Diagnosis not present

## 2018-06-17 DIAGNOSIS — Z298 Encounter for other specified prophylactic measures: Secondary | ICD-10-CM | POA: Diagnosis not present

## 2018-06-17 DIAGNOSIS — Z944 Liver transplant status: Secondary | ICD-10-CM | POA: Diagnosis not present

## 2018-06-20 DIAGNOSIS — R202 Paresthesia of skin: Secondary | ICD-10-CM | POA: Diagnosis not present

## 2018-06-20 DIAGNOSIS — E7800 Pure hypercholesterolemia, unspecified: Secondary | ICD-10-CM | POA: Diagnosis not present

## 2018-06-20 DIAGNOSIS — R5383 Other fatigue: Secondary | ICD-10-CM | POA: Diagnosis not present

## 2018-06-20 DIAGNOSIS — Z1211 Encounter for screening for malignant neoplasm of colon: Secondary | ICD-10-CM | POA: Diagnosis not present

## 2018-06-20 DIAGNOSIS — L4050 Arthropathic psoriasis, unspecified: Secondary | ICD-10-CM | POA: Diagnosis not present

## 2018-06-20 DIAGNOSIS — I10 Essential (primary) hypertension: Secondary | ICD-10-CM | POA: Diagnosis not present

## 2018-06-20 DIAGNOSIS — Z1331 Encounter for screening for depression: Secondary | ICD-10-CM | POA: Diagnosis not present

## 2018-06-20 DIAGNOSIS — Z0000 Encounter for general adult medical examination without abnormal findings: Secondary | ICD-10-CM | POA: Diagnosis not present

## 2018-06-20 DIAGNOSIS — Z7189 Other specified counseling: Secondary | ICD-10-CM | POA: Diagnosis not present

## 2018-06-20 DIAGNOSIS — Z1339 Encounter for screening examination for other mental health and behavioral disorders: Secondary | ICD-10-CM | POA: Diagnosis not present

## 2018-07-05 DIAGNOSIS — M47812 Spondylosis without myelopathy or radiculopathy, cervical region: Secondary | ICD-10-CM | POA: Diagnosis not present

## 2018-07-09 DIAGNOSIS — Z125 Encounter for screening for malignant neoplasm of prostate: Secondary | ICD-10-CM | POA: Diagnosis not present

## 2018-07-09 DIAGNOSIS — Z6832 Body mass index (BMI) 32.0-32.9, adult: Secondary | ICD-10-CM | POA: Diagnosis not present

## 2018-07-09 DIAGNOSIS — Z87891 Personal history of nicotine dependence: Secondary | ICD-10-CM | POA: Diagnosis not present

## 2018-07-09 DIAGNOSIS — M25511 Pain in right shoulder: Secondary | ICD-10-CM | POA: Diagnosis not present

## 2018-07-09 DIAGNOSIS — I10 Essential (primary) hypertension: Secondary | ICD-10-CM | POA: Diagnosis not present

## 2018-07-09 DIAGNOSIS — E7800 Pure hypercholesterolemia, unspecified: Secondary | ICD-10-CM | POA: Diagnosis not present

## 2018-07-09 DIAGNOSIS — R5383 Other fatigue: Secondary | ICD-10-CM | POA: Diagnosis not present

## 2018-07-09 DIAGNOSIS — Z79899 Other long term (current) drug therapy: Secondary | ICD-10-CM | POA: Diagnosis not present

## 2018-07-09 DIAGNOSIS — R69 Illness, unspecified: Secondary | ICD-10-CM | POA: Diagnosis not present

## 2018-07-09 DIAGNOSIS — G4700 Insomnia, unspecified: Secondary | ICD-10-CM | POA: Diagnosis not present

## 2018-07-09 DIAGNOSIS — Z0000 Encounter for general adult medical examination without abnormal findings: Secondary | ICD-10-CM | POA: Diagnosis not present

## 2018-07-09 DIAGNOSIS — Z299 Encounter for prophylactic measures, unspecified: Secondary | ICD-10-CM | POA: Diagnosis not present

## 2018-07-22 DIAGNOSIS — L570 Actinic keratosis: Secondary | ICD-10-CM | POA: Diagnosis not present

## 2018-08-09 DIAGNOSIS — Z944 Liver transplant status: Secondary | ICD-10-CM | POA: Diagnosis not present

## 2018-08-09 DIAGNOSIS — Z298 Encounter for other specified prophylactic measures: Secondary | ICD-10-CM | POA: Diagnosis not present

## 2018-08-27 DIAGNOSIS — Z298 Encounter for other specified prophylactic measures: Secondary | ICD-10-CM | POA: Diagnosis not present

## 2018-08-27 DIAGNOSIS — G4700 Insomnia, unspecified: Secondary | ICD-10-CM | POA: Diagnosis not present

## 2018-08-27 DIAGNOSIS — R69 Illness, unspecified: Secondary | ICD-10-CM | POA: Diagnosis not present

## 2018-08-27 DIAGNOSIS — I10 Essential (primary) hypertension: Secondary | ICD-10-CM | POA: Diagnosis not present

## 2018-08-27 DIAGNOSIS — K7460 Unspecified cirrhosis of liver: Secondary | ICD-10-CM | POA: Diagnosis not present

## 2018-08-27 DIAGNOSIS — Z944 Liver transplant status: Secondary | ICD-10-CM | POA: Diagnosis not present

## 2018-08-27 DIAGNOSIS — Z299 Encounter for prophylactic measures, unspecified: Secondary | ICD-10-CM | POA: Diagnosis not present

## 2018-08-27 DIAGNOSIS — Z6831 Body mass index (BMI) 31.0-31.9, adult: Secondary | ICD-10-CM | POA: Diagnosis not present

## 2018-09-19 DIAGNOSIS — L400 Psoriasis vulgaris: Secondary | ICD-10-CM | POA: Diagnosis not present

## 2018-09-27 DIAGNOSIS — Z6832 Body mass index (BMI) 32.0-32.9, adult: Secondary | ICD-10-CM | POA: Diagnosis not present

## 2018-09-27 DIAGNOSIS — L259 Unspecified contact dermatitis, unspecified cause: Secondary | ICD-10-CM | POA: Diagnosis not present

## 2018-09-27 DIAGNOSIS — K7460 Unspecified cirrhosis of liver: Secondary | ICD-10-CM | POA: Diagnosis not present

## 2018-09-27 DIAGNOSIS — Z299 Encounter for prophylactic measures, unspecified: Secondary | ICD-10-CM | POA: Diagnosis not present

## 2018-09-27 DIAGNOSIS — I10 Essential (primary) hypertension: Secondary | ICD-10-CM | POA: Diagnosis not present

## 2018-09-27 DIAGNOSIS — R69 Illness, unspecified: Secondary | ICD-10-CM | POA: Diagnosis not present

## 2018-11-21 DIAGNOSIS — Z944 Liver transplant status: Secondary | ICD-10-CM | POA: Diagnosis not present

## 2018-11-21 DIAGNOSIS — Z298 Encounter for other specified prophylactic measures: Secondary | ICD-10-CM | POA: Diagnosis not present

## 2018-11-25 DIAGNOSIS — L409 Psoriasis, unspecified: Secondary | ICD-10-CM | POA: Diagnosis not present

## 2018-11-25 DIAGNOSIS — L570 Actinic keratosis: Secondary | ICD-10-CM | POA: Diagnosis not present

## 2018-11-25 DIAGNOSIS — Z85828 Personal history of other malignant neoplasm of skin: Secondary | ICD-10-CM | POA: Diagnosis not present

## 2018-12-27 DIAGNOSIS — I10 Essential (primary) hypertension: Secondary | ICD-10-CM | POA: Diagnosis not present

## 2018-12-27 DIAGNOSIS — Z6832 Body mass index (BMI) 32.0-32.9, adult: Secondary | ICD-10-CM | POA: Diagnosis not present

## 2018-12-27 DIAGNOSIS — R69 Illness, unspecified: Secondary | ICD-10-CM | POA: Diagnosis not present

## 2018-12-27 DIAGNOSIS — Z299 Encounter for prophylactic measures, unspecified: Secondary | ICD-10-CM | POA: Diagnosis not present

## 2018-12-27 DIAGNOSIS — L4050 Arthropathic psoriasis, unspecified: Secondary | ICD-10-CM | POA: Diagnosis not present

## 2018-12-27 DIAGNOSIS — E7800 Pure hypercholesterolemia, unspecified: Secondary | ICD-10-CM | POA: Diagnosis not present

## 2019-01-07 DIAGNOSIS — Z944 Liver transplant status: Secondary | ICD-10-CM | POA: Diagnosis not present

## 2019-01-07 DIAGNOSIS — D899 Disorder involving the immune mechanism, unspecified: Secondary | ICD-10-CM | POA: Diagnosis not present

## 2019-02-20 DIAGNOSIS — D899 Disorder involving the immune mechanism, unspecified: Secondary | ICD-10-CM | POA: Diagnosis not present

## 2019-02-20 DIAGNOSIS — Z944 Liver transplant status: Secondary | ICD-10-CM | POA: Diagnosis not present

## 2019-03-06 DIAGNOSIS — Z713 Dietary counseling and surveillance: Secondary | ICD-10-CM | POA: Diagnosis not present

## 2019-03-06 DIAGNOSIS — Z299 Encounter for prophylactic measures, unspecified: Secondary | ICD-10-CM | POA: Diagnosis not present

## 2019-03-06 DIAGNOSIS — J069 Acute upper respiratory infection, unspecified: Secondary | ICD-10-CM | POA: Diagnosis not present

## 2019-03-06 DIAGNOSIS — Z6832 Body mass index (BMI) 32.0-32.9, adult: Secondary | ICD-10-CM | POA: Diagnosis not present

## 2019-03-06 DIAGNOSIS — I10 Essential (primary) hypertension: Secondary | ICD-10-CM | POA: Diagnosis not present

## 2019-03-14 DIAGNOSIS — Z299 Encounter for prophylactic measures, unspecified: Secondary | ICD-10-CM | POA: Diagnosis not present

## 2019-03-14 DIAGNOSIS — I10 Essential (primary) hypertension: Secondary | ICD-10-CM | POA: Diagnosis not present

## 2019-03-14 DIAGNOSIS — Z6832 Body mass index (BMI) 32.0-32.9, adult: Secondary | ICD-10-CM | POA: Diagnosis not present

## 2019-03-14 DIAGNOSIS — R69 Illness, unspecified: Secondary | ICD-10-CM | POA: Diagnosis not present

## 2019-03-14 DIAGNOSIS — R0602 Shortness of breath: Secondary | ICD-10-CM | POA: Diagnosis not present

## 2019-03-26 DIAGNOSIS — L570 Actinic keratosis: Secondary | ICD-10-CM | POA: Diagnosis not present

## 2019-03-26 DIAGNOSIS — D485 Neoplasm of uncertain behavior of skin: Secondary | ICD-10-CM | POA: Diagnosis not present

## 2019-04-08 DIAGNOSIS — R69 Illness, unspecified: Secondary | ICD-10-CM | POA: Diagnosis not present

## 2019-05-06 DIAGNOSIS — R69 Illness, unspecified: Secondary | ICD-10-CM | POA: Diagnosis not present

## 2019-05-06 DIAGNOSIS — I10 Essential (primary) hypertension: Secondary | ICD-10-CM | POA: Diagnosis not present

## 2019-05-06 DIAGNOSIS — M25552 Pain in left hip: Secondary | ICD-10-CM | POA: Diagnosis not present

## 2019-05-06 DIAGNOSIS — Z6831 Body mass index (BMI) 31.0-31.9, adult: Secondary | ICD-10-CM | POA: Diagnosis not present

## 2019-05-06 DIAGNOSIS — Z299 Encounter for prophylactic measures, unspecified: Secondary | ICD-10-CM | POA: Diagnosis not present

## 2019-05-19 DIAGNOSIS — R69 Illness, unspecified: Secondary | ICD-10-CM | POA: Diagnosis not present

## 2019-05-21 DIAGNOSIS — D899 Disorder involving the immune mechanism, unspecified: Secondary | ICD-10-CM | POA: Diagnosis not present

## 2019-05-21 DIAGNOSIS — Z944 Liver transplant status: Secondary | ICD-10-CM | POA: Diagnosis not present

## 2019-05-28 DIAGNOSIS — Z944 Liver transplant status: Secondary | ICD-10-CM | POA: Diagnosis not present

## 2019-05-28 DIAGNOSIS — I10 Essential (primary) hypertension: Secondary | ICD-10-CM | POA: Diagnosis not present

## 2019-05-28 DIAGNOSIS — L4050 Arthropathic psoriasis, unspecified: Secondary | ICD-10-CM | POA: Diagnosis not present

## 2019-05-28 DIAGNOSIS — Z299 Encounter for prophylactic measures, unspecified: Secondary | ICD-10-CM | POA: Diagnosis not present

## 2019-05-28 DIAGNOSIS — M25552 Pain in left hip: Secondary | ICD-10-CM | POA: Diagnosis not present

## 2019-05-28 DIAGNOSIS — M5430 Sciatica, unspecified side: Secondary | ICD-10-CM | POA: Diagnosis not present

## 2019-05-28 DIAGNOSIS — Z6832 Body mass index (BMI) 32.0-32.9, adult: Secondary | ICD-10-CM | POA: Diagnosis not present

## 2019-06-19 DIAGNOSIS — Z944 Liver transplant status: Secondary | ICD-10-CM | POA: Diagnosis not present

## 2019-06-19 DIAGNOSIS — Z5181 Encounter for therapeutic drug level monitoring: Secondary | ICD-10-CM | POA: Diagnosis not present

## 2019-06-19 DIAGNOSIS — D849 Immunodeficiency, unspecified: Secondary | ICD-10-CM | POA: Diagnosis not present

## 2019-06-19 DIAGNOSIS — I10 Essential (primary) hypertension: Secondary | ICD-10-CM | POA: Diagnosis not present

## 2019-07-28 DIAGNOSIS — L570 Actinic keratosis: Secondary | ICD-10-CM | POA: Diagnosis not present

## 2019-07-28 DIAGNOSIS — Z85828 Personal history of other malignant neoplasm of skin: Secondary | ICD-10-CM | POA: Diagnosis not present

## 2019-07-28 DIAGNOSIS — L409 Psoriasis, unspecified: Secondary | ICD-10-CM | POA: Diagnosis not present

## 2019-07-28 DIAGNOSIS — L280 Lichen simplex chronicus: Secondary | ICD-10-CM | POA: Diagnosis not present

## 2019-08-06 DIAGNOSIS — L4050 Arthropathic psoriasis, unspecified: Secondary | ICD-10-CM | POA: Diagnosis not present

## 2019-08-06 DIAGNOSIS — Z299 Encounter for prophylactic measures, unspecified: Secondary | ICD-10-CM | POA: Diagnosis not present

## 2019-08-06 DIAGNOSIS — Z944 Liver transplant status: Secondary | ICD-10-CM | POA: Diagnosis not present

## 2019-08-06 DIAGNOSIS — I10 Essential (primary) hypertension: Secondary | ICD-10-CM | POA: Diagnosis not present

## 2019-08-06 DIAGNOSIS — R69 Illness, unspecified: Secondary | ICD-10-CM | POA: Diagnosis not present

## 2019-09-04 DIAGNOSIS — Z7982 Long term (current) use of aspirin: Secondary | ICD-10-CM | POA: Diagnosis not present

## 2019-09-04 DIAGNOSIS — Z6833 Body mass index (BMI) 33.0-33.9, adult: Secondary | ICD-10-CM | POA: Diagnosis not present

## 2019-09-04 DIAGNOSIS — R69 Illness, unspecified: Secondary | ICD-10-CM | POA: Diagnosis not present

## 2019-09-29 DIAGNOSIS — Z298 Encounter for other specified prophylactic measures: Secondary | ICD-10-CM | POA: Diagnosis not present

## 2019-09-29 DIAGNOSIS — D849 Immunodeficiency, unspecified: Secondary | ICD-10-CM | POA: Diagnosis not present

## 2019-09-29 DIAGNOSIS — Z944 Liver transplant status: Secondary | ICD-10-CM | POA: Diagnosis not present

## 2019-10-03 DIAGNOSIS — K7460 Unspecified cirrhosis of liver: Secondary | ICD-10-CM | POA: Diagnosis not present

## 2019-10-03 DIAGNOSIS — K219 Gastro-esophageal reflux disease without esophagitis: Secondary | ICD-10-CM | POA: Diagnosis not present

## 2019-10-28 DIAGNOSIS — Z299 Encounter for prophylactic measures, unspecified: Secondary | ICD-10-CM | POA: Diagnosis not present

## 2019-10-28 DIAGNOSIS — R69 Illness, unspecified: Secondary | ICD-10-CM | POA: Diagnosis not present

## 2019-10-28 DIAGNOSIS — I10 Essential (primary) hypertension: Secondary | ICD-10-CM | POA: Diagnosis not present

## 2019-10-28 DIAGNOSIS — L4050 Arthropathic psoriasis, unspecified: Secondary | ICD-10-CM | POA: Diagnosis not present

## 2019-10-28 DIAGNOSIS — Z6831 Body mass index (BMI) 31.0-31.9, adult: Secondary | ICD-10-CM | POA: Diagnosis not present

## 2019-11-03 NOTE — Progress Notes (Addendum)
[facility]  11/04/2019     CHIEF COMPLAINT Patient presents for Retina Evaluation   HISTORY OF PRESENT ILLNESS: Joseph Greer is a 64 y.o. male who presents to the clinic today for:   HPI    Retina Evaluation    In left eye.  This started 5 days ago.  Associated Symptoms Floaters.  Context:  distance vision and near vision.  Response to treatment was no improvement.  I, the attending physician,  performed the HPI with the patient and updated documentation appropriately.          Comments    Last Thursday pt noticed a "black blob, floater" in vision OS.  It is constent with no improvement.  It is blocking his vision.  Denies FOLs.  Pt referred by Dr. Rosana Hoes for Large PVD and blood in vitreous.       Last edited by Bernarda Caffey, MD on 11/04/2019 11:28 PM. (History)    Pt states he saw Dr. Rosana Hoes on Friday because he started seeing a "shadowy figure" in his left eye on Thursday afternoon, he states since then it has maybe gotten a little worse, he denies fol   Referring physician: Leticia Clas, [place] [address] [place],  [place] [address]  HISTORICAL INFORMATION:   Selected notes from the MEDICAL RECORD NUMBER Referred back by Dr. Rosana Hoes for concern of large PVD w/blood in vitreous OS.     CURRENT MEDICATIONS: No current outpatient medications on file. (Ophthalmic Drugs)   No current facility-administered medications for this visit. (Ophthalmic Drugs)   Current Outpatient Medications (Other)  Medication Sig  . buPROPion (WELLBUTRIN) 75 MG tablet Take 75 mg by mouth 2 (two) times daily.   Marland Kitchen FLUoxetine (PROZAC) 40 MG capsule Take 40 mg by mouth daily.  Marland Kitchen lisinopril (PRINIVIL,ZESTRIL) 20 MG tablet Take 20 mg by mouth daily.  . mycophenolate (CELLCEPT) 250 MG capsule Take 250 mg by mouth 2 (two) times daily.   . nitroGLYCERIN (NITROSTAT) 0.4 MG SL tablet Place 1 tablet (0.4 mg total) under the tongue every 5 (five) minutes x 3  doses as needed for chest pain.  Marland Kitchen tacrolimus (PROGRAF) 1 MG capsule Take 2 mg by mouth 2 (two) times daily.   Marland Kitchen triamcinolone ointment (KENALOG) 0.1 % Apply 1 application topically 2 (two) times daily.   No current facility-administered medications for this visit. (Other)      REVIEW OF SYSTEMS:    ALLERGIES No Known Allergies  PAST MEDICAL HISTORY Past Medical History:  Diagnosis Date  . Anxiety   . Depression   . Hepatitis C   . Hypertension   . Psoriasis   . Status post liver transplant [facility])    Past Surgical History:  Procedure Laterality Date  . COLONOSCOPY    . COLONOSCOPY WITH PROPOFOL N/A 06/01/2017   Procedure: COLONOSCOPY WITH PROPOFOL;  Surgeon: Rogene Houston, MD;  [facility];  Service: Endoscopy;  Laterality: N/A;  7:30  . HERNIA REPAIR Right   . LIVER BIOPSY    . LIVER SURGERY    . POLYPECTOMY  06/01/2017   Procedure: POLYPECTOMY;  Surgeon: Rogene Houston, MD;  [facility];  Service: Endoscopy;;  polyp at sigmoid colon x2, ascending colon polyp, hepatic flexure polyp  . UPPER GASTROINTESTINAL ENDOSCOPY      FAMILY HISTORY Family History  Problem Relation Age of Onset  . Diabetes Mother   . Heart disease Father   .  Heart disease Son     SOCIAL HISTORY Social History   Tobacco Use  . Smoking status: Former Smoker    Packs/day: 1.00    Types: Cigarettes    Quit date: 09/04/2007    Years since quitting: 12.1  . Smokeless tobacco: Never Used  Substance Use Topics  . Alcohol use: No  . Drug use: No         OPHTHALMIC EXAM:  Base Eye Exam    Visual Acuity (Snellen - Linear)      Right Left   Dist [place] 20/40 20/50 +2   Dist ph [place] 20/20 NI       Tonometry (Tonopen, 9:06 AM)      Right Left   Pressure 18 20       Pupils      Dark Light Shape React APD   Right 4 3 Round Brisk None   Left 4 3 Round Brisk None       Visual Fields (Counting fingers)      Left Right    Full Full       Extraocular  Movement      Right Left    Full Full       Neuro/Psych    Oriented x3: Yes   Mood/Affect: Normal       Dilation    Both eyes: 1.0% Mydriacyl, 2.5% Phenylephrine @ 9:06 AM        Slit Lamp and Fundus Exam    Slit Lamp Exam      Right Left   Lids/Lashes Dermatochalasis - upper lid, Meibomian gland dysfunction Dermatochalasis - upper lid   Conjunctiva/Sclera White and quiet White and quiet   Cornea Arcus, otherwise clear Arcus, otherwise clear   Anterior Chamber Deep and quiet Deep and quiet   Iris Round and dilated Round and dilated   Lens 2-3+ Nuclear sclerosis, 2-3+ Cortical cataract 2-3+ Nuclear sclerosis, 2-3+ Cortical cataract   Vitreous Mild Vitreous syneresis, Posterior vitreous detachment Vitreous syneresis, +RBC in anerior vit, Posterior vitreous detachment, blood stained vitreous condensations greatest centrally       Fundus Exam      Right Left   Disc Pink and Sharp partially obscured by blood stained vitreous, pink and sharp   C/D Ratio 0.3 0.3   Macula Flat, blunted foveal reflex, mild RPE mottling and clumping, No heme or edema Partially obscured, grossly flat   Vessels Vascular attenuation, mild Tortuousity, mild AV crossing changes Vascular attenuation, mild Tortuousity, mild AV crossing changes   Periphery Attached; no RT/RD on 360 exam; peripheral cystoid degeneration Attached, pre-retinal heme inferiorly, two punctate hemes at 0130 equator and anterior, White without pressure temporally        Refraction    Manifest Refraction      Sphere Cylinder Axis Dist VA   Right -0.50 +0.75 180 20/20-   Left -0.75 +0.75 155 20/40-          IMAGING AND PROCEDURES  Imaging and Procedures for 07/17/17  OCT, Retina - OU - Both Eyes       Right Eye Quality was good. Central Foveal Thickness: 284. Progression has improved. Findings include normal foveal contour, no IRF, no SRF, retinal drusen , outer retinal atrophy (Focal areas of ORA/ellipsoid disruption;  interval improvement in vitreous opacitites).   Left Eye Quality was good. Central Foveal Thickness: 284. Progression has worsened. Findings include normal foveal contour, no IRF, no SRF, outer retinal atrophy, retinal drusen  (Focal areas of ORA and  ellipsoid disruption, +vitreous opacities, interval release of VMA).   Notes *Images captured and stored on drive  Diagnosis / Impression:  Rare drusen and outer retinal and RPE disruption consistent with mild nonexudative ARMD OD: Focal areas of ORA/ellipsoid disruption; interval improvement in vitreous opacitites OS: Focal areas of ORA and ellipsoid disruption, +vitreous opacities, interval release of VMA   Clinical management:  See below  Abbreviations: NFP - Normal foveal profile. CME - cystoid macular edema. PED - pigment epithelial detachment. IRF - intraretinal fluid. SRF - subretinal fluid. EZ - ellipsoid zone. ERM - epiretinal membrane. ORA - outer retinal atrophy. ORT - outer retinal tubulation. SRHM - subretinal hyper-reflective material                  ASSESSMENT/PLAN:    ICD-10-CM   1. Posterior vitreous detachment of both eyes  H43.813   2. Vitreous hemorrhage of left eye (HCC)  H43.12   3. Early dry stage nonexudative age-related macular degeneration of both eyes  H35.3131   4. Retinal edema  H35.81 OCT, Retina - OU - Both Eyes  5. Combined form of age-related cataract, both eyes  H25.813     1,2. Hemorrhagic PVD OU  - acute hemorrhagic PVD OS, onset Thursday 5.27.2021 -- symptomatic floaters, no photopsias  - onset of PVD OD 2019 -- lost to f/u since initial eval  - Discussed findings and prognosis  - No RT or RD on 360 scleral depressed exam OS  - Reviewed s/s of RT/RD  - Strict return precautions for any such RT/RD signs/symptoms  - VH precautions reviewed -- minimize activities, keep head elevated, avoid ASA/NSAIDs/blood thinners as able  - f/u 2-3 weeks, DFE, OCT  3. Age related macular degeneration,  non-exudative, both eyes  - very mild drusen / early RPE changes OU -- stable from 2019  - The incidence, anatomy, and pathology of dry AMD, risk of progression, and the AREDS and AREDS 2 study including smoking risks discussed with patient.  - Recommend amsler grid monitoring  4. No retinal edema on exam or OCT  5. Combined form age-related cataract OU-   - The symptoms of cataract, surgical options, and treatments and risks were discussed with patient.  - discussed diagnosis and progression  - monitor   Ophthalmic Meds Ordered this visit:  No orders of the defined types were placed in this encounter.      Return for f/u 2-3 weeks, PVD OS, DFE, OCT.  There are no Patient Instructions on file for this visit.   Explained the diagnoses, plan, and follow up with the patient and they expressed understanding.  Patient expressed understanding of the importance of proper follow up care.   This document serves as a record of services personally performed by Gardiner Sleeper, MD, PhD. It was created on their behalf by Estill Bakes, COT an ophthalmic technician. The creation of this record is the provider's dictation and/or activities during the visit.    Electronically signed by: Estill Bakes, COT 11/03/19 @ 11:29 PM   This document serves as a record of services personally performed by Gardiner Sleeper, MD, PhD. It was created on their behalf by Ernest Mallick, OA, an ophthalmic assistant. The creation of this record is the provider's dictation and/or activities during the visit.    Electronically signed by: Ernest Mallick, OA 06.01.2021 11:29 PM  Gardiner Sleeper, M.D., Ph.D. Diseases & Surgery of the Retina and [place] 11/04/2019  I have reviewed the above documentation for accuracy and completeness, and I agree with the above. Gardiner Sleeper, M.D., Ph.D. 11/04/19 11:29 PM   Abbreviations: M myopia (nearsighted); A astigmatism; H hyperopia  (farsighted); P presbyopia; Mrx spectacle prescription;  CTL contact lenses; OD right eye; OS left eye; OU both eyes  XT exotropia; ET esotropia; PEK punctate epithelial keratitis; PEE punctate epithelial erosions; DES dry eye syndrome; MGD meibomian gland dysfunction; ATs artificial tears; PFAT's preservative free artificial tears; [place] nuclear sclerotic cataract; PSC posterior subcapsular cataract; ERM epi-retinal membrane; PVD posterior vitreous detachment; RD retinal detachment; DM diabetes mellitus; DR diabetic retinopathy; NPDR non-proliferative diabetic retinopathy; PDR proliferative diabetic retinopathy; CSME clinically significant macular edema; DME diabetic macular edema; dbh dot blot hemorrhages; CWS cotton wool spot; POAG primary open angle glaucoma; C/D cup-to-disc ratio; HVF humphrey visual field; GVF goldmann visual field; OCT optical coherence tomography; IOP intraocular pressure; BRVO Branch retinal vein occlusion; CRVO central retinal vein occlusion; CRAO central retinal artery occlusion; BRAO branch retinal artery occlusion; RT retinal tear; SB scleral buckle; PPV pars plana vitrectomy; VH Vitreous hemorrhage; PRP panretinal laser photocoagulation; IVK intravitreal kenalog; VMT vitreomacular traction; MH Macular hole;  NVD neovascularization of the disc; NVE neovascularization elsewhere; AREDS age related eye disease study; ARMD age related macular degeneration; POAG primary open angle glaucoma; EBMD epithelial/anterior basement membrane dystrophy; ACIOL anterior chamber intraocular lens; IOL intraocular lens; PCIOL posterior chamber intraocular lens; Phaco/IOL phacoemulsification with intraocular lens placement; [place] photorefractive keratectomy; LASIK laser assisted in situ keratomileusis; HTN hypertension; DM diabetes mellitus; COPD chronic obstructive pulmonary disease

## 2019-11-04 ENCOUNTER — Ambulatory Visit (INDEPENDENT_AMBULATORY_CARE_PROVIDER_SITE_OTHER): Payer: Medicare HMO | Admitting: Ophthalmology

## 2019-11-04 ENCOUNTER — Encounter (INDEPENDENT_AMBULATORY_CARE_PROVIDER_SITE_OTHER): Payer: Self-pay | Admitting: Ophthalmology

## 2019-11-04 ENCOUNTER — Other Ambulatory Visit: Payer: Self-pay

## 2019-11-04 DIAGNOSIS — H4312 Vitreous hemorrhage, left eye: Secondary | ICD-10-CM

## 2019-11-04 DIAGNOSIS — H43813 Vitreous degeneration, bilateral: Secondary | ICD-10-CM | POA: Diagnosis not present

## 2019-11-04 DIAGNOSIS — H353131 Nonexudative age-related macular degeneration, bilateral, early dry stage: Secondary | ICD-10-CM

## 2019-11-04 DIAGNOSIS — H3581 Retinal edema: Secondary | ICD-10-CM

## 2019-11-04 DIAGNOSIS — H25813 Combined forms of age-related cataract, bilateral: Secondary | ICD-10-CM

## 2019-11-24 NOTE — Progress Notes (Signed)
[facility] Note  11/26/2019     CHIEF COMPLAINT Patient presents for Retina Follow Up   HISTORY OF PRESENT ILLNESS: Joseph Greer is a 64 y.o. male who presents to the clinic today for:   HPI    Retina Follow Up    Patient presents with  PVD.  In left eye.  Duration of 3 weeks.  I, the attending physician,  performed the HPI with the patient and updated documentation appropriately.          Comments    3 week follow up PVD hemorrhage OS- Pt states vision is no better since last visit OS.  Denies FOLs.       Last edited by Bernarda Caffey, MD on 11/26/2019  1:08 PM. (History)    Pt states the floater is still there, but may be a little better, he has been sleeping with his head elevated, he denies new fol  Referring physician: Monico Blitz, MD [place],  [place] [address]  HISTORICAL INFORMATION:   Selected notes from the MEDICAL RECORD NUMBER Referred back by Dr. Rosana Hoes for concern of large PVD w/blood in vitreous OS.     CURRENT MEDICATIONS: No current outpatient medications on file. (Ophthalmic Drugs)   No current facility-administered medications for this visit. (Ophthalmic Drugs)   Current Outpatient Medications (Other)  Medication Sig  . buPROPion (WELLBUTRIN) 75 MG tablet Take 75 mg by mouth 2 (two) times daily.   Marland Kitchen FLUoxetine (PROZAC) 40 MG capsule Take 40 mg by mouth daily.  Marland Kitchen lisinopril (PRINIVIL,ZESTRIL) 20 MG tablet Take 20 mg by mouth daily.  . mycophenolate (CELLCEPT) 250 MG capsule Take 250 mg by mouth 2 (two) times daily.   . nitroGLYCERIN (NITROSTAT) 0.4 MG SL tablet Place 1 tablet (0.4 mg total) under the tongue every 5 (five) minutes x 3 doses as needed for chest pain.  Marland Kitchen tacrolimus (PROGRAF) 1 MG capsule Take 2 mg by mouth 2 (two) times daily.   Marland Kitchen triamcinolone ointment (KENALOG) 0.1 % Apply 1 application topically 2 (two) times daily.   No current facility-administered medications for this visit. (Other)       REVIEW OF SYSTEMS: ROS    Positive for: Endocrine, Cardiovascular, Eyes   Negative for: Constitutional, Gastrointestinal, Neurological, Skin, Genitourinary, Musculoskeletal, HENT, Respiratory, Psychiatric, Allergic/Imm, Heme/Lymph   Last edited by Leonie Douglas, COA on 11/26/2019  9:56 AM. (History)       ALLERGIES No Known Allergies  PAST MEDICAL HISTORY Past Medical History:  Diagnosis Date  . Anxiety   . Depression   . Hepatitis C   . Hypertension   . Psoriasis   . Status post liver transplant [facility])    Past Surgical History:  Procedure Laterality Date  . COLONOSCOPY    . COLONOSCOPY WITH PROPOFOL N/A 06/01/2017   Procedure: COLONOSCOPY WITH PROPOFOL;  Surgeon: Rogene Houston, MD;  [facility];  Service: Endoscopy;  Laterality: N/A;  7:30  . HERNIA REPAIR Right   . LIVER BIOPSY    . LIVER SURGERY    . POLYPECTOMY  06/01/2017   Procedure: POLYPECTOMY;  Surgeon: Rogene Houston, MD;  [facility];  Service: Endoscopy;;  polyp at sigmoid colon x2, ascending colon polyp, hepatic flexure polyp  . UPPER GASTROINTESTINAL ENDOSCOPY      FAMILY HISTORY Family History  Problem Relation Age of Onset  . Diabetes Mother   . Heart disease Father   . Heart disease Son  SOCIAL HISTORY Social History   Tobacco Use  . Smoking status: Former Smoker    Packs/day: 1.00    Types: Cigarettes    Quit date: 09/04/2007    Years since quitting: 12.2  . Smokeless tobacco: Never Used  Vaping Use  . Vaping Use: Never used  Substance Use Topics  . Alcohol use: No  . Drug use: No         OPHTHALMIC EXAM:  Base Eye Exam    Visual Acuity (Snellen - Linear)      Right Left   Dist [place] 20/40 20/30   Dist ph [place] 20/25 NI  PH OS- "Floater is now blocking my vision"       Tonometry (Tonopen, 10:02 AM)      Right Left   Pressure 18 19       Pupils      Dark Light Shape React APD   Right 4 3 Round Brisk None   Left 4 3 Round Brisk None        Visual Fields (Counting fingers)      Left Right    Full Full       Extraocular Movement      Right Left    Full Full       Neuro/Psych    Oriented x3: Yes   Mood/Affect: Normal       Dilation    Both eyes: 1.0% Mydriacyl, 2.5% Phenylephrine @ 10:02 AM        Slit Lamp and Fundus Exam    Slit Lamp Exam      Right Left   Lids/Lashes Dermatochalasis - upper lid, Meibomian gland dysfunction Dermatochalasis - upper lid   Conjunctiva/Sclera White and quiet White and quiet   Cornea Arcus, otherwise clear Arcus, otherwise clear   Anterior Chamber Deep and quiet Deep and quiet   Iris Round and dilated Round and dilated   Lens 2-3+ Nuclear sclerosis, 2-3+ Cortical cataract 2-3+ Nuclear sclerosis, 2-3+ Cortical cataract   Vitreous Mild Vitreous syneresis, Posterior vitreous detachment Vitreous syneresis, Posterior vitreous detachment, blood stained vitreous condensations clearing centrally and settling inferiorly still obscuring inferior macula       Fundus Exam      Right Left   Disc Pink and Sharp partially obscured by blood stained vitreous, pink and sharp   C/D Ratio 0.3 0.3   Macula Flat, blunted foveal reflex, mild RPE mottling and clumping, No heme or edema Partially obscured (inferior half), grossly flat   Vessels Vascular attenuation, mild Tortuousity, mild AV crossing changes Vascular attenuation, Tortuous   Periphery Attached; no RT/RD on 360 exam; peripheral cystoid degeneration Attached, pre-retinal heme inferiorly, two punctate hemes at 0130 equator and anterior, White without pressure temporally, No RT/RD          IMAGING AND PROCEDURES  Imaging and Procedures for 07/17/17  OCT, Retina - OU - Both Eyes       Right Eye Quality was good. Central Foveal Thickness: 285. Progression has been stable. Findings include normal foveal contour, no IRF, no SRF, retinal drusen , outer retinal atrophy (Focal areas of ORA/ellipsoid disruption; interval improvement in  vitreous opacitites).   Left Eye Quality was good. Central Foveal Thickness: 275. Progression has improved. Findings include normal foveal contour, no IRF, no SRF, outer retinal atrophy, retinal drusen  (Mild improvement in vitreous opacities).   Notes *Images captured and stored on drive  Diagnosis / Impression:  Rare drusen and outer retinal and RPE disruption consistent with  mild nonexudative ARMD OD: Focal areas of ORA/ellipsoid disruption; interval improvement in vitreous opacitites OS:  Mild improvement in vitreous opacities  Clinical management:  See below  Abbreviations: NFP - Normal foveal profile. CME - cystoid macular edema. PED - pigment epithelial detachment. IRF - intraretinal fluid. SRF - subretinal fluid. EZ - ellipsoid zone. ERM - epiretinal membrane. ORA - outer retinal atrophy. ORT - outer retinal tubulation. SRHM - subretinal hyper-reflective material                  ASSESSMENT/PLAN:    ICD-10-CM   1. Posterior vitreous detachment of both eyes  H43.813   2. Vitreous hemorrhage of left eye (HCC)  H43.12   3. Early dry stage nonexudative age-related macular degeneration of both eyes  H35.3131   4. Retinal edema  H35.81 OCT, Retina - OU - Both Eyes  5. Combined form of age-related cataract, both eyes  H25.813     1,2. Hemorrhagic PVD OU  - acute hemorrhagic PVD OS, onset Thursday 05.27.21 -- symptomatic floaters, no photopsias -- improving  - onset of PVD OD 2019 -- lost to f/u since initial eval  - Discussed findings and prognosis  - No RT or RD on 360 exam OS  - Reviewed s/s of RT/RD  - Strict return precautions for any such RT/RD signs/symptoms  - VH precautions reviewed -- minimize activities, keep head elevated, avoid ASA/NSAIDs/blood thinners as able  - f/u 3-4 weeks, DFE, OCT  3. Age related macular degeneration, non-exudative, both eyes  - very mild drusen / early RPE changes OU -- stable from 2019  - The incidence, anatomy, and pathology of  dry AMD, risk of progression, and the AREDS and AREDS 2 study including smoking risks discussed with patient.  - Recommend amsler grid monitoring  4. No retinal edema on exam or OCT  5. Combined form age-related cataract OU-   - The symptoms of cataract, surgical options, and treatments and risks were discussed with patient.  - discussed diagnosis and progression  - monitor   Ophthalmic Meds Ordered this visit:  No orders of the defined types were placed in this encounter.      Return for f/u 3-4 weeks, hemorrhagic PVD OS, DFE, OCT.  There are no Patient Instructions on file for this visit.   Explained the diagnoses, plan, and follow up with the patient and they expressed understanding.  Patient expressed understanding of the importance of proper follow up care.   This document serves as a record of services personally performed by Gardiner Sleeper, MD, PhD. It was created on their behalf by San Jetty. Owens Shark, COT, an ophthalmic technician. The creation of this record is the provider's dictation and/or activities during the visit.    Electronically signed by: San Jetty. Owens Shark, COT 06.23.2021 1:10 PM  Gardiner Sleeper, M.D., Ph.D. Diseases & Surgery of the Retina and [place] 11/26/2019   I have reviewed the above documentation for accuracy and completeness, and I agree with the above. Gardiner Sleeper, M.D., Ph.D. 11/26/19 1:10 PM   Abbreviations: M myopia (nearsighted); A astigmatism; H hyperopia (farsighted); P presbyopia; Mrx spectacle prescription;  CTL contact lenses; OD right eye; OS left eye; OU both eyes  XT exotropia; ET esotropia; PEK punctate epithelial keratitis; PEE punctate epithelial erosions; DES dry eye syndrome; MGD meibomian gland dysfunction; ATs artificial tears; PFAT's preservative free artificial tears; [place] nuclear sclerotic cataract; PSC posterior subcapsular cataract; ERM epi-retinal membrane; PVD posterior  vitreous  detachment; RD retinal detachment; DM diabetes mellitus; DR diabetic retinopathy; NPDR non-proliferative diabetic retinopathy; PDR proliferative diabetic retinopathy; CSME clinically significant macular edema; DME diabetic macular edema; dbh dot blot hemorrhages; CWS cotton wool spot; POAG primary open angle glaucoma; C/D cup-to-disc ratio; HVF humphrey visual field; GVF goldmann visual field; OCT optical coherence tomography; IOP intraocular pressure; BRVO Branch retinal vein occlusion; CRVO central retinal vein occlusion; CRAO central retinal artery occlusion; BRAO branch retinal artery occlusion; RT retinal tear; SB scleral buckle; PPV pars plana vitrectomy; VH Vitreous hemorrhage; PRP panretinal laser photocoagulation; IVK intravitreal kenalog; VMT vitreomacular traction; MH Macular hole;  NVD neovascularization of the disc; NVE neovascularization elsewhere; AREDS age related eye disease study; ARMD age related macular degeneration; POAG primary open angle glaucoma; EBMD epithelial/anterior basement membrane dystrophy; ACIOL anterior chamber intraocular lens; IOL intraocular lens; PCIOL posterior chamber intraocular lens; Phaco/IOL phacoemulsification with intraocular lens placement; [place] photorefractive keratectomy; LASIK laser assisted in situ keratomileusis; HTN hypertension; DM diabetes mellitus; COPD chronic obstructive pulmonary disease

## 2019-11-26 ENCOUNTER — Other Ambulatory Visit: Payer: Self-pay

## 2019-11-26 ENCOUNTER — Encounter (INDEPENDENT_AMBULATORY_CARE_PROVIDER_SITE_OTHER): Payer: Self-pay | Admitting: Ophthalmology

## 2019-11-26 ENCOUNTER — Ambulatory Visit (INDEPENDENT_AMBULATORY_CARE_PROVIDER_SITE_OTHER): Payer: Medicare HMO | Admitting: Ophthalmology

## 2019-11-26 DIAGNOSIS — H3581 Retinal edema: Secondary | ICD-10-CM | POA: Diagnosis not present

## 2019-11-26 DIAGNOSIS — H25813 Combined forms of age-related cataract, bilateral: Secondary | ICD-10-CM | POA: Diagnosis not present

## 2019-11-26 DIAGNOSIS — H4312 Vitreous hemorrhage, left eye: Secondary | ICD-10-CM

## 2019-11-26 DIAGNOSIS — H353131 Nonexudative age-related macular degeneration, bilateral, early dry stage: Secondary | ICD-10-CM | POA: Diagnosis not present

## 2019-11-26 DIAGNOSIS — H43813 Vitreous degeneration, bilateral: Secondary | ICD-10-CM | POA: Diagnosis not present

## 2019-12-01 DIAGNOSIS — Z85828 Personal history of other malignant neoplasm of skin: Secondary | ICD-10-CM | POA: Diagnosis not present

## 2019-12-01 DIAGNOSIS — L570 Actinic keratosis: Secondary | ICD-10-CM | POA: Diagnosis not present

## 2019-12-01 DIAGNOSIS — L814 Other melanin hyperpigmentation: Secondary | ICD-10-CM | POA: Diagnosis not present

## 2019-12-01 DIAGNOSIS — I781 Nevus, non-neoplastic: Secondary | ICD-10-CM | POA: Diagnosis not present

## 2019-12-01 DIAGNOSIS — D2272 Melanocytic nevi of left lower limb, including hip: Secondary | ICD-10-CM | POA: Diagnosis not present

## 2019-12-01 DIAGNOSIS — C44519 Basal cell carcinoma of skin of other part of trunk: Secondary | ICD-10-CM | POA: Diagnosis not present

## 2019-12-01 DIAGNOSIS — D225 Melanocytic nevi of trunk: Secondary | ICD-10-CM | POA: Diagnosis not present

## 2019-12-01 DIAGNOSIS — D485 Neoplasm of uncertain behavior of skin: Secondary | ICD-10-CM | POA: Diagnosis not present

## 2019-12-02 DIAGNOSIS — Z299 Encounter for prophylactic measures, unspecified: Secondary | ICD-10-CM | POA: Diagnosis not present

## 2019-12-02 DIAGNOSIS — G4700 Insomnia, unspecified: Secondary | ICD-10-CM | POA: Diagnosis not present

## 2019-12-02 DIAGNOSIS — I10 Essential (primary) hypertension: Secondary | ICD-10-CM | POA: Diagnosis not present

## 2019-12-02 DIAGNOSIS — R69 Illness, unspecified: Secondary | ICD-10-CM | POA: Diagnosis not present

## 2019-12-09 DIAGNOSIS — R7989 Other specified abnormal findings of blood chemistry: Secondary | ICD-10-CM | POA: Diagnosis not present

## 2019-12-09 DIAGNOSIS — Z299 Encounter for prophylactic measures, unspecified: Secondary | ICD-10-CM | POA: Diagnosis not present

## 2019-12-09 DIAGNOSIS — J811 Chronic pulmonary edema: Secondary | ICD-10-CM | POA: Diagnosis not present

## 2019-12-09 DIAGNOSIS — I10 Essential (primary) hypertension: Secondary | ICD-10-CM | POA: Diagnosis not present

## 2019-12-09 DIAGNOSIS — Z8619 Personal history of other infectious and parasitic diseases: Secondary | ICD-10-CM | POA: Diagnosis not present

## 2019-12-09 DIAGNOSIS — J810 Acute pulmonary edema: Secondary | ICD-10-CM | POA: Diagnosis not present

## 2019-12-09 DIAGNOSIS — R0602 Shortness of breath: Secondary | ICD-10-CM | POA: Diagnosis not present

## 2019-12-09 DIAGNOSIS — R69 Illness, unspecified: Secondary | ICD-10-CM | POA: Diagnosis not present

## 2019-12-09 DIAGNOSIS — I110 Hypertensive heart disease with heart failure: Secondary | ICD-10-CM | POA: Diagnosis not present

## 2019-12-09 DIAGNOSIS — I214 Non-ST elevation (NSTEMI) myocardial infarction: Secondary | ICD-10-CM | POA: Diagnosis not present

## 2019-12-09 DIAGNOSIS — J90 Pleural effusion, not elsewhere classified: Secondary | ICD-10-CM | POA: Diagnosis not present

## 2019-12-09 DIAGNOSIS — Z944 Liver transplant status: Secondary | ICD-10-CM | POA: Diagnosis not present

## 2019-12-09 DIAGNOSIS — I4891 Unspecified atrial fibrillation: Secondary | ICD-10-CM | POA: Diagnosis not present

## 2019-12-09 DIAGNOSIS — I509 Heart failure, unspecified: Secondary | ICD-10-CM | POA: Diagnosis not present

## 2019-12-09 DIAGNOSIS — R778 Other specified abnormalities of plasma proteins: Secondary | ICD-10-CM | POA: Diagnosis not present

## 2019-12-11 ENCOUNTER — Inpatient Hospital Stay (HOSPITAL_COMMUNITY)
Admission: EM | Admit: 2019-12-11 | Discharge: 2020-01-01 | DRG: 228 | Disposition: A | Discharge status: Home Health | Payer: Medicare HMO | Source: Other Acute Inpatient Hospital | Attending: Cardiothoracic Surgery | Admitting: Cardiothoracic Surgery

## 2019-12-11 DIAGNOSIS — Y95 Nosocomial condition: Secondary | ICD-10-CM | POA: Diagnosis not present

## 2019-12-11 DIAGNOSIS — I10 Essential (primary) hypertension: Secondary | ICD-10-CM

## 2019-12-11 DIAGNOSIS — Q211 Atrial septal defect: Secondary | ICD-10-CM

## 2019-12-11 DIAGNOSIS — Z4659 Encounter for fitting and adjustment of other gastrointestinal appliance and device: Secondary | ICD-10-CM

## 2019-12-11 DIAGNOSIS — I493 Ventricular premature depolarization: Secondary | ICD-10-CM | POA: Diagnosis not present

## 2019-12-11 DIAGNOSIS — R570 Cardiogenic shock: Secondary | ICD-10-CM | POA: Diagnosis not present

## 2019-12-11 DIAGNOSIS — N1831 Chronic kidney disease, stage 3a: Secondary | ICD-10-CM | POA: Diagnosis present

## 2019-12-11 DIAGNOSIS — Z6826 Body mass index (BMI) 26.0-26.9, adult: Secondary | ICD-10-CM

## 2019-12-11 DIAGNOSIS — F329 Major depressive disorder, single episode, unspecified: Secondary | ICD-10-CM | POA: Diagnosis not present

## 2019-12-11 DIAGNOSIS — N170 Acute kidney failure with tubular necrosis: Secondary | ICD-10-CM | POA: Diagnosis present

## 2019-12-11 DIAGNOSIS — I471 Supraventricular tachycardia: Secondary | ICD-10-CM | POA: Diagnosis present

## 2019-12-11 DIAGNOSIS — I214 Non-ST elevation (NSTEMI) myocardial infarction: Secondary | ICD-10-CM | POA: Diagnosis not present

## 2019-12-11 DIAGNOSIS — Z9889 Other specified postprocedural states: Secondary | ICD-10-CM

## 2019-12-11 DIAGNOSIS — Z944 Liver transplant status: Secondary | ICD-10-CM

## 2019-12-11 DIAGNOSIS — Z79899 Other long term (current) drug therapy: Secondary | ICD-10-CM

## 2019-12-11 DIAGNOSIS — F05 Delirium due to known physiological condition: Secondary | ICD-10-CM | POA: Diagnosis not present

## 2019-12-11 DIAGNOSIS — E872 Acidosis: Secondary | ICD-10-CM | POA: Diagnosis not present

## 2019-12-11 DIAGNOSIS — I472 Ventricular tachycardia: Secondary | ICD-10-CM | POA: Diagnosis not present

## 2019-12-11 DIAGNOSIS — R079 Chest pain, unspecified: Secondary | ICD-10-CM

## 2019-12-11 DIAGNOSIS — R34 Anuria and oliguria: Secondary | ICD-10-CM | POA: Diagnosis not present

## 2019-12-11 DIAGNOSIS — I480 Paroxysmal atrial fibrillation: Secondary | ICD-10-CM | POA: Diagnosis present

## 2019-12-11 DIAGNOSIS — T380X5A Adverse effect of glucocorticoids and synthetic analogues, initial encounter: Secondary | ICD-10-CM | POA: Diagnosis not present

## 2019-12-11 DIAGNOSIS — E1122 Type 2 diabetes mellitus with diabetic chronic kidney disease: Secondary | ICD-10-CM | POA: Diagnosis not present

## 2019-12-11 DIAGNOSIS — Z951 Presence of aortocoronary bypass graft: Secondary | ICD-10-CM

## 2019-12-11 DIAGNOSIS — Z9689 Presence of other specified functional implants: Secondary | ICD-10-CM

## 2019-12-11 DIAGNOSIS — E785 Hyperlipidemia, unspecified: Secondary | ICD-10-CM | POA: Diagnosis present

## 2019-12-11 DIAGNOSIS — I219 Acute myocardial infarction, unspecified: Secondary | ICD-10-CM | POA: Diagnosis present

## 2019-12-11 DIAGNOSIS — F410 Panic disorder [episodic paroxysmal anxiety] without agoraphobia: Secondary | ICD-10-CM | POA: Diagnosis present

## 2019-12-11 DIAGNOSIS — I5023 Acute on chronic systolic (congestive) heart failure: Secondary | ICD-10-CM | POA: Diagnosis not present

## 2019-12-11 DIAGNOSIS — I509 Heart failure, unspecified: Secondary | ICD-10-CM

## 2019-12-11 DIAGNOSIS — I5082 Biventricular heart failure: Secondary | ICD-10-CM | POA: Diagnosis not present

## 2019-12-11 DIAGNOSIS — I255 Ischemic cardiomyopathy: Secondary | ICD-10-CM | POA: Diagnosis present

## 2019-12-11 DIAGNOSIS — E440 Moderate protein-calorie malnutrition: Secondary | ICD-10-CM | POA: Diagnosis not present

## 2019-12-11 DIAGNOSIS — I2510 Atherosclerotic heart disease of native coronary artery without angina pectoris: Secondary | ICD-10-CM | POA: Diagnosis not present

## 2019-12-11 DIAGNOSIS — D62 Acute posthemorrhagic anemia: Secondary | ICD-10-CM | POA: Diagnosis not present

## 2019-12-11 DIAGNOSIS — J9601 Acute respiratory failure with hypoxia: Secondary | ICD-10-CM | POA: Diagnosis not present

## 2019-12-11 DIAGNOSIS — Z87891 Personal history of nicotine dependence: Secondary | ICD-10-CM | POA: Diagnosis not present

## 2019-12-11 DIAGNOSIS — J189 Pneumonia, unspecified organism: Secondary | ICD-10-CM | POA: Diagnosis not present

## 2019-12-11 DIAGNOSIS — Z09 Encounter for follow-up examination after completed treatment for conditions other than malignant neoplasm: Secondary | ICD-10-CM

## 2019-12-11 DIAGNOSIS — I130 Hypertensive heart and chronic kidney disease with heart failure and stage 1 through stage 4 chronic kidney disease, or unspecified chronic kidney disease: Secondary | ICD-10-CM | POA: Diagnosis not present

## 2019-12-11 DIAGNOSIS — N179 Acute kidney failure, unspecified: Secondary | ICD-10-CM

## 2019-12-11 DIAGNOSIS — Z452 Encounter for adjustment and management of vascular access device: Secondary | ICD-10-CM

## 2019-12-11 DIAGNOSIS — E875 Hyperkalemia: Secondary | ICD-10-CM | POA: Diagnosis not present

## 2019-12-11 DIAGNOSIS — E1165 Type 2 diabetes mellitus with hyperglycemia: Secondary | ICD-10-CM | POA: Diagnosis not present

## 2019-12-11 DIAGNOSIS — J9811 Atelectasis: Secondary | ICD-10-CM

## 2019-12-11 HISTORY — DX: Type 2 diabetes mellitus without complications: E11.9

## 2019-12-11 HISTORY — DX: Chronic kidney disease, stage 3 unspecified: N18.30

## 2019-12-11 LAB — CBC WITH DIFFERENTIAL/PLATELET
Abs Immature Granulocytes: 0.02 10*3/uL (ref 0.00–0.07)
Basophils Absolute: 0.1 10*3/uL (ref 0.0–0.1)
Basophils Relative: 1 %
Eosinophils Absolute: 0.2 10*3/uL (ref 0.0–0.5)
Eosinophils Relative: 4 %
HCT: 40.8 % (ref 39.0–52.0)
Hemoglobin: 13 g/dL (ref 13.0–17.0)
Immature Granulocytes: 0 %
Lymphocytes Relative: 18 %
Lymphs Abs: 1.2 10*3/uL (ref 0.7–4.0)
MCH: 26.2 pg (ref 26.0–34.0)
MCHC: 31.9 g/dL (ref 30.0–36.0)
MCV: 82.1 fL (ref 80.0–100.0)
Monocytes Absolute: 0.6 10*3/uL (ref 0.1–1.0)
Monocytes Relative: 10 %
Neutro Abs: 4.5 10*3/uL (ref 1.7–7.7)
Neutrophils Relative %: 67 %
Platelets: 302 10*3/uL (ref 150–400)
RBC: 4.97 MIL/uL (ref 4.22–5.81)
RDW: 14.7 % (ref 11.5–15.5)
WBC: 6.6 10*3/uL (ref 4.0–10.5)
nRBC: 0 % (ref 0.0–0.2)

## 2019-12-11 LAB — HEMOGLOBIN A1C
Hgb A1c MFr Bld: 5.9 % — ABNORMAL HIGH (ref 4.8–5.6)
Mean Plasma Glucose: 122.63 mg/dL

## 2019-12-11 LAB — COMPREHENSIVE METABOLIC PANEL
ALT: 19 U/L (ref 0–44)
AST: 28 U/L (ref 15–41)
Albumin: 3.4 g/dL — ABNORMAL LOW (ref 3.5–5.0)
Alkaline Phosphatase: 77 U/L (ref 38–126)
Anion gap: 12 (ref 5–15)
BUN: 27 mg/dL — ABNORMAL HIGH (ref 8–23)
CO2: 22 mmol/L (ref 22–32)
Calcium: 8.6 mg/dL — ABNORMAL LOW (ref 8.9–10.3)
Chloride: 105 mmol/L (ref 98–111)
Creatinine, Ser: 1.91 mg/dL — ABNORMAL HIGH (ref 0.61–1.24)
GFR calc Af Amer: 43 mL/min — ABNORMAL LOW (ref >60)
GFR calc non Af Amer: 37 mL/min — ABNORMAL LOW (ref >60)
Glucose, Bld: 155 mg/dL — ABNORMAL HIGH (ref 70–99)
Potassium: 3.6 mmol/L (ref 3.5–5.1)
Sodium: 139 mmol/L (ref 135–145)
Total Bilirubin: 1.1 mg/dL (ref 0.3–1.2)
Total Protein: 6.9 g/dL (ref 6.5–8.1)

## 2019-12-11 LAB — HIV ANTIBODY (ROUTINE TESTING W REFLEX): HIV Screen 4th Generation wRfx: NONREACTIVE

## 2019-12-11 LAB — HEPARIN LEVEL (UNFRACTIONATED): Heparin Unfractionated: 0.2 IU/mL — ABNORMAL LOW (ref 0.30–0.70)

## 2019-12-11 LAB — MAGNESIUM: Magnesium: 1.5 mg/dL — ABNORMAL LOW (ref 1.7–2.4)

## 2019-12-11 LAB — TROPONIN I (HIGH SENSITIVITY): Troponin I (High Sensitivity): 669 ng/L (ref <18)

## 2019-12-11 LAB — TSH: TSH: 4.855 u[IU]/mL — ABNORMAL HIGH (ref 0.350–4.500)

## 2019-12-11 MED ORDER — ASPIRIN 300 MG RE SUPP: 300.0000 mg | RECTAL | Status: AC

## 2019-12-11 MED ORDER — ASPIRIN 81 MG PO CHEW
324.0000 mg | CHEWABLE_TABLET | ORAL | Status: AC
  Administered 2019-12-11: 324 mg via ORAL
  Filled 2019-12-11: qty 4

## 2019-12-11 MED ORDER — AMLODIPINE BESYLATE 5 MG PO TABS: 5.0000 mg | ORAL_TABLET | Freq: Every day | ORAL | Status: DC

## 2019-12-11 MED ORDER — TACROLIMUS 1 MG PO CAPS
2.0000 mg | ORAL_CAPSULE | Freq: Two times a day (BID) | ORAL | Status: DC
  Administered 2019-12-11 – 2019-12-22 (×21): 2 mg via ORAL
  Filled 2019-12-11 (×23): qty 2

## 2019-12-11 MED ORDER — VENLAFAXINE HCL ER 75 MG PO CP24
75.0000 mg | ORAL_CAPSULE | Freq: Every day | ORAL | Status: DC
  Administered 2019-12-12 – 2019-12-22 (×10): 75 mg via ORAL
  Filled 2019-12-11 (×11): qty 1

## 2019-12-11 MED ORDER — METOPROLOL TARTRATE 25 MG PO TABS
25.0000 mg | ORAL_TABLET | Freq: Four times a day (QID) | ORAL | Status: DC
  Administered 2019-12-11 – 2019-12-16 (×18): 25 mg via ORAL
  Filled 2019-12-11 (×20): qty 1

## 2019-12-11 MED ORDER — ALPRAZOLAM 0.25 MG PO TABS
0.2500 mg | ORAL_TABLET | Freq: Two times a day (BID) | ORAL | Status: DC | PRN
  Administered 2019-12-12 – 2019-12-18 (×11): 0.25 mg via ORAL
  Filled 2019-12-11 (×11): qty 1

## 2019-12-11 MED ORDER — NITROGLYCERIN 0.4 MG SL SUBL: 0.4000 mg | SUBLINGUAL_TABLET | SUBLINGUAL | Status: DC | PRN

## 2019-12-11 MED ORDER — ZOLPIDEM TARTRATE 5 MG PO TABS
5.0000 mg | ORAL_TABLET | Freq: Every evening | ORAL | Status: DC | PRN
  Administered 2019-12-13 – 2019-12-18 (×5): 5 mg via ORAL
  Filled 2019-12-11 (×6): qty 1

## 2019-12-11 MED ORDER — ATORVASTATIN CALCIUM 80 MG PO TABS
80.0000 mg | ORAL_TABLET | Freq: Every day | ORAL | Status: DC
  Administered 2019-12-11 – 2019-12-20 (×9): 80 mg via ORAL
  Filled 2019-12-11 (×9): qty 1

## 2019-12-11 MED ORDER — DILTIAZEM HCL-DEXTROSE 125-5 MG/125ML-% IV SOLN (PREMIX)
5.0000 mg/h | INTRAVENOUS | Status: DC
  Filled 2019-12-11: qty 125

## 2019-12-11 MED ORDER — ASPIRIN EC 81 MG PO TBEC
81.0000 mg | DELAYED_RELEASE_TABLET | Freq: Every day | ORAL | Status: DC
  Administered 2019-12-12 – 2019-12-18 (×6): 81 mg via ORAL
  Filled 2019-12-11 (×6): qty 1

## 2019-12-11 MED ORDER — ONDANSETRON HCL 4 MG/2ML IJ SOLN: 4.0000 mg | Freq: Four times a day (QID) | INTRAMUSCULAR | Status: DC | PRN

## 2019-12-11 MED ORDER — METOPROLOL TARTRATE 25 MG PO TABS: 25.0000 mg | ORAL_TABLET | Freq: Two times a day (BID) | ORAL | Status: DC

## 2019-12-11 MED ORDER — HEPARIN (PORCINE) 25000 UT/250ML-% IV SOLN
1550.0000 [IU]/h | INTRAVENOUS | Status: DC
  Filled 2019-12-11: qty 250

## 2019-12-11 MED ORDER — APREMILAST 30 MG PO TABS
1.0000 | ORAL_TABLET | Freq: Two times a day (BID) | ORAL | Status: DC
  Administered 2019-12-11 – 2019-12-21 (×18): 30 mg via ORAL
  Filled 2019-12-11 (×21): qty 1

## 2019-12-11 MED ORDER — ACETAMINOPHEN 325 MG PO TABS: 650.0000 mg | ORAL_TABLET | ORAL | Status: DC | PRN

## 2019-12-11 NOTE — Progress Notes (Signed)
ANTICOAGULATION CONSULT NOTE  Pharmacy Consult for heparin Indication: chest pain/ACS  No Known Allergies  Patient Measurements: Height: 5\' 10"  (177.8 cm) Weight: 84.1 kg (185 lb 6.4 oz) IBW/kg (Calculated) : 73  Vital Signs: Temp: 97.9 F (36.6 C) (07/08 2017) Temp Source: Oral (07/08 2017) BP: 103/74 (07/08 2335) Pulse Rate: 78 (07/08 2335)  Labs: Recent Labs    12/11/19 2140 12/11/19 2318  HGB 13.0  --   HCT 40.8  --   PLT 302  --   HEPARINUNFRC  --  0.20*  CREATININE 1.91*  --   TROPONINIHS 669*  --     Estimated Creatinine Clearance: 41.4 mL/min (A) (by C-G formula based on SCr of 1.91 mg/dL (H)).  Assessment: 64 yo m transferred from OSH on heparin 1350 units/hr for NSTEMI.  No AC PTA.  Heparin level subtherapeutic (0.2) on gtt at 1350 units/hr. No issues with line or bleeding reported per RN.  Goal of Therapy:  Heparin level 0.3-0.7 units/ml Monitor platelets by anticoagulation protocol: Yes   Plan:  Increase heparin to 1550 units/hr F/u 6 hr heparin level  Sherlon Handing, PharmD, BCPS Please see amion for complete clinical pharmacist phone list 12/11/2019 11:55 PM

## 2019-12-11 NOTE — H&P (Signed)
Cardiology Admission History and Physical:   Patient ID: Joseph Greer MRN: [id number]; DOB: 05-19-1956   Admission date: 12/11/2019  Primary Care Provider: Monico Blitz, MD [place] Cardiologist: No primary care provider on file.  [place] [facility] Electrophysiologist:  None   Chief Complaint:  CP/NSTEMI  Patient Profile:   Joseph Greer is a 64 y.o. male with h/o Hep C s/p liver transplant in 2009, no prior cardiac hx, transferred from [facility] after he presented there w/ SOB and fatigue, found to have abnormal troponin. EKG here at [facility] shows AF w/ RVR.  History of Present Illness:   Mr. [name] presented to the ED at [facility] on 12-09-19 c/o SOB and fatigue. Pt states he suddenly noticed about 7-8 days ago that he was more SOB than usual with decreased exercise tolerance. He tried to swim laps in the pool the other day; he can usually swim 30-40 laps at a time, but had to stop before completing his workout due to fatigue and SOB.  He denies any associated chest tightness with exertion, although he has had a "full" feeling in his stomach and chest at times. He has noticed low O2 sats from time to time as well. He denies feeling his heart racing or fluttering, presyncope or syncope. Initial EKG was unremarkable (notes indicate initial EKG from 12-09-19 showed NSR, LVH w/o ischemic changes; tracing is not available), but troponin was found to be elevated (standard trop) at 2.4. He has been on heparin IV gtt for the past nearly 48 hours, and most recent troponin available in the system from [place] was 0.894 on 12-11-19 at 2:04pm. EKG from 12-10-19 at [facility] shows afib w/ RVR.  He was transferred to [facility] as NSTEMI for LHC; it was not disclosed that pt was having AF w/ RVR. Pt arrived on IV diltiazem and heparin. On my exam he was in AF w/ HR in the 150s but w/o significant complaints.   Past Medical History:  Diagnosis Date  . Anxiety   . Depression   .  Hepatitis C   . Hypertension   . Psoriasis   . Status post liver transplant [facility])     Past Surgical History:  Procedure Laterality Date  . COLONOSCOPY    . COLONOSCOPY WITH PROPOFOL N/A 06/01/2017   Procedure: COLONOSCOPY WITH PROPOFOL;  Surgeon: Rogene Houston, MD;  Location: [facility] ENDO SUITE;  Service: Endoscopy;  Laterality: N/A;  7:30  . HERNIA REPAIR Right   . LIVER BIOPSY    . LIVER SURGERY    . POLYPECTOMY  06/01/2017   Procedure: POLYPECTOMY;  Surgeon: Rogene Houston, MD;  Location: [facility] ENDO SUITE;  Service: Endoscopy;;  polyp at sigmoid colon x2, ascending colon polyp, hepatic flexure polyp  . UPPER GASTROINTESTINAL ENDOSCOPY       Medications Prior to Admission: Prior to Admission medications   Medication Sig Start Date End Date Taking? Authorizing Provider  tacrolimus (PROGRAF) 1 MG capsule Take 2 mg by mouth 2 (two) times daily.    Yes [provider]  buPROPion (WELLBUTRIN) 75 MG tablet Take 75 mg by mouth 2 (two) times daily.     [provider]  FLUoxetine (PROZAC) 40 MG capsule Take 40 mg by mouth daily.    [provider]  lisinopril (PRINIVIL,ZESTRIL) 20 MG tablet Take 20 mg by mouth daily.    [provider]  mycophenolate (CELLCEPT) 250 MG capsule Take 250 mg by mouth 2 (two) times daily.  [provider]  nitroGLYCERIN (NITROSTAT) 0.4 MG SL tablet Place 1 tablet (0.4 mg total) under the tongue every 5 (five) minutes x 3 doses as needed for chest pain. 03/27/13   Rai, Ripudeep Raliegh Ip, MD  triamcinolone ointment (KENALOG) 0.1 % Apply 1 application topically 2 (two) times daily.    [provider]     Allergies:   No Known Allergies  Social History:   Social History   Socioeconomic History  . Marital status: Single    Spouse name: Not on file  . Number of children: Not on file  . Years of education: Not on file  . Highest education level: Not on file  Occupational History  . Not on file  Tobacco Use  .  Smoking status: Former Smoker    Packs/day: 1.00    Types: Cigarettes    Quit date: 09/04/2007    Years since quitting: 12.2  . Smokeless tobacco: Never Used  Vaping Use  . Vaping Use: Never used  Substance and Sexual Activity  . Alcohol use: No  . Drug use: No  . Sexual activity: Yes    Birth control/protection: None  Other Topics Concern  . Not on file  Social History Narrative  . Not on file   Social Determinants of Health   Financial Resource Strain:   . Difficulty of Paying Living Expenses:   Food Insecurity:   . Worried About Charity fundraiser in the Last Year:   . Arboriculturist in the Last Year:   Transportation Needs:   . Film/video editor (Medical):   Marland Kitchen Lack of Transportation (Non-Medical):   Physical Activity:   . Days of Exercise per Week:   . Minutes of Exercise per Session:   Stress:   . Feeling of Stress :   Social Connections:   . Frequency of Communication with Friends and Family:   . Frequency of Social Gatherings with Friends and Family:   . Attends Religious Services:   . Active Member of Clubs or Organizations:   . Attends Archivist Meetings:   Marland Kitchen Marital Status:   Intimate Partner Violence:   . Fear of Current or Ex-Partner:   . Emotionally Abused:   Marland Kitchen Physically Abused:   . Sexually Abused:     Family History:   The patient's family history includes Diabetes in his mother; Heart disease in his father and son.    ROS:  Please see the history of present illness.  All other ROS reviewed and negative.     Physical Exam/Data:   Vitals:   12/11/19 2000 12/11/19 2017  BP:  123/88  Pulse:  (!) 136  Resp:  20  Temp:  97.9 F (36.6 C)  TempSrc:  Oral  SpO2:  98%  Weight: 84.1 kg   Height: 5\' 10"  (1.778 m)    No intake or output data in the 24 hours ending 12/11/19 2103 Last 3 Weights 12/11/2019 05/25/2017 04/09/2013  Weight (lbs) 185 lb 6.4 oz 203 lb 183 lb 9.6 oz  Weight (kg) 84.097 kg 92.08 kg 83.28 kg     Body mass  index is 26.6 kg/m.  General:  Well nourished, well developed, in no acute distress HEENT: normal Lymph: no adenopathy Neck: no JVD Endocrine:  No thryomegaly Vascular: No carotid bruits; DP pulses 2+ bilaterally  Cardiac:  normal S1, S2; irreg irreg; no murmur  Lungs:  clear to auscultation bilaterally, no wheezing, rhonchi or rales  Abd: soft, nontender,  no hepatomegaly  Ext: no edema Musculoskeletal:  No deformities Skin: warm and dry  Neuro:  no focal abnormalities noted Psych:  Normal affect    EKG:  The ECG that was done 12-10-19 at [facility] was personally reviewed and demonstrates afib w/ RVR, occasional PVC  Relevant CV Studies: none  Laboratory Data:  High Sensitivity Troponin:  No results for input(s): TROPONINIHS in the last 720 hours.    ChemistryNo results for input(s): NA, K, CL, CO2, GLUCOSE, BUN, CREATININE, CALCIUM, GFRNONAA, GFRAA, ANIONGAP in the last 168 hours.  No results for input(s): PROT, ALBUMIN, AST, ALT, ALKPHOS, BILITOT in the last 168 hours. HematologyNo results for input(s): WBC, RBC, HGB, HCT, MCV, MCH, MCHC, RDW, PLT in the last 168 hours. BNPNo results for input(s): BNP, PROBNP in the last 168 hours.  DDimer No results for input(s): DDIMER in the last 168 hours. Trop [facility] from 12-10-19 1.816-->1.771-->1.703-->1.140-->1.119-->0.894  Radiology/Studies:  No results found.     TIMI Risk Score for Unstable Angina or Non-ST Elevation MI:   The patient's TIMI risk score is 1, which indicates a 5% risk of all cause mortality, new or recurrent myocardial infarction or need for urgent revascularization in the next 14 days.   New York Heart Association (NYHA) Functional Class NYHA Class II  Assessment and Plan:   1. CP/NSTEMI: not clear if pt has been having paroxysmal AF w/ RVR over the past 1-2 weeks that is responsible for his sx or initial elevated troponin, as initial EKG showed NSR. Could also be an element of superimposed CAD/ischemia.  Will keep him NPO after MN tonight, either for TEE-DCCV may need LHC at some point as well. Hold off on TTE until NSR restored. Cont IV heparin gtt. Will start metoprolol 25mg  PO q6h. OK to continue diltiazem gtt also. Will get A1c,TSH, FLP for risk stratification. 2. Post liver transplant: will cont his current immunosuppressive regimen.  3. AF: RVR. See #1 above 4. HTN: on amlodipine at home. Will d/c the amlodipine while he is on cardizem.   For questions or updates, please contact [place] Please consult [email or web address] for contact info under     Signed, Rudean Curt, MD, [facility]  12/11/2019 9:03 PM

## 2019-12-11 NOTE — Progress Notes (Signed)
ANTICOAGULATION CONSULT NOTE - Initial Consult  Pharmacy Consult for heparin Indication: chest pain/ACS  No Known Allergies  Patient Measurements: Height: 5\' 10"  (177.8 cm) Weight: 84.1 kg (185 lb 6.4 oz) IBW/kg (Calculated) : 73  Vital Signs: Temp: 97.9 F (36.6 C) (07/08 2017) Temp Source: Oral (07/08 2017) BP: 123/71 (07/08 2132) Pulse Rate: 70 (07/08 2132)  Labs: No results for input(s): HGB, HCT, PLT, APTT, LABPROT, INR, HEPARINUNFRC, HEPRLOWMOCWT, CREATININE, CKTOTAL, CKMB, TROPONINIHS in the last 72 hours.  CrCl cannot be calculated (Patient's most recent lab result is older than the maximum 21 days allowed.).  Assessment: 64 yo m transferred from OSH on heparin 1350 units/hr for ACS  No AC PTA  Goal of Therapy:  Heparin level 0.3-0.7 units/ml Monitor platelets by anticoagulation protocol: Yes   Plan:  Continue heparin 1350 units/hr Hep lvl now then daily F/u cards plans  Barth Kirks, PharmD, BCPS, BCCCP Clinical Pharmacist [phone]  Please check AMION for all [place] numbers  12/11/2019 9:40 PM

## 2019-12-12 ENCOUNTER — Encounter (HOSPITAL_COMMUNITY): Payer: Self-pay | Admitting: Cardiology

## 2019-12-12 ENCOUNTER — Observation Stay (HOSPITAL_BASED_OUTPATIENT_CLINIC_OR_DEPARTMENT_OTHER): Payer: Medicare HMO

## 2019-12-12 ENCOUNTER — Encounter (HOSPITAL_COMMUNITY)
Admission: EM | Disposition: A | Discharge status: Home Health | Payer: Self-pay | Source: Other Acute Inpatient Hospital | Attending: Cardiothoracic Surgery

## 2019-12-12 DIAGNOSIS — F329 Major depressive disorder, single episode, unspecified: Secondary | ICD-10-CM | POA: Diagnosis present

## 2019-12-12 DIAGNOSIS — Z4682 Encounter for fitting and adjustment of non-vascular catheter: Secondary | ICD-10-CM | POA: Diagnosis not present

## 2019-12-12 DIAGNOSIS — R918 Other nonspecific abnormal finding of lung field: Secondary | ICD-10-CM | POA: Diagnosis not present

## 2019-12-12 DIAGNOSIS — I110 Hypertensive heart disease with heart failure: Secondary | ICD-10-CM | POA: Diagnosis not present

## 2019-12-12 DIAGNOSIS — J811 Chronic pulmonary edema: Secondary | ICD-10-CM | POA: Diagnosis not present

## 2019-12-12 DIAGNOSIS — I4891 Unspecified atrial fibrillation: Secondary | ICD-10-CM | POA: Diagnosis not present

## 2019-12-12 DIAGNOSIS — I313 Pericardial effusion (noninflammatory): Secondary | ICD-10-CM | POA: Diagnosis not present

## 2019-12-12 DIAGNOSIS — Z951 Presence of aortocoronary bypass graft: Secondary | ICD-10-CM | POA: Diagnosis not present

## 2019-12-12 DIAGNOSIS — N179 Acute kidney failure, unspecified: Secondary | ICD-10-CM | POA: Diagnosis not present

## 2019-12-12 DIAGNOSIS — Z01810 Encounter for preprocedural cardiovascular examination: Secondary | ICD-10-CM | POA: Diagnosis not present

## 2019-12-12 DIAGNOSIS — E785 Hyperlipidemia, unspecified: Secondary | ICD-10-CM | POA: Diagnosis not present

## 2019-12-12 DIAGNOSIS — I509 Heart failure, unspecified: Secondary | ICD-10-CM | POA: Diagnosis not present

## 2019-12-12 DIAGNOSIS — I5023 Acute on chronic systolic (congestive) heart failure: Secondary | ICD-10-CM | POA: Diagnosis not present

## 2019-12-12 DIAGNOSIS — Z992 Dependence on renal dialysis: Secondary | ICD-10-CM | POA: Diagnosis not present

## 2019-12-12 DIAGNOSIS — I480 Paroxysmal atrial fibrillation: Secondary | ICD-10-CM

## 2019-12-12 DIAGNOSIS — E1129 Type 2 diabetes mellitus with other diabetic kidney complication: Secondary | ICD-10-CM | POA: Diagnosis not present

## 2019-12-12 DIAGNOSIS — N1831 Chronic kidney disease, stage 3a: Secondary | ICD-10-CM | POA: Diagnosis not present

## 2019-12-12 DIAGNOSIS — E1122 Type 2 diabetes mellitus with diabetic chronic kidney disease: Secondary | ICD-10-CM | POA: Diagnosis present

## 2019-12-12 DIAGNOSIS — I5082 Biventricular heart failure: Secondary | ICD-10-CM | POA: Diagnosis present

## 2019-12-12 DIAGNOSIS — F410 Panic disorder [episodic paroxysmal anxiety] without agoraphobia: Secondary | ICD-10-CM | POA: Diagnosis present

## 2019-12-12 DIAGNOSIS — I2510 Atherosclerotic heart disease of native coronary artery without angina pectoris: Secondary | ICD-10-CM | POA: Diagnosis not present

## 2019-12-12 DIAGNOSIS — I130 Hypertensive heart and chronic kidney disease with heart failure and stage 1 through stage 4 chronic kidney disease, or unspecified chronic kidney disease: Secondary | ICD-10-CM | POA: Diagnosis not present

## 2019-12-12 DIAGNOSIS — I340 Nonrheumatic mitral (valve) insufficiency: Secondary | ICD-10-CM | POA: Diagnosis not present

## 2019-12-12 DIAGNOSIS — Z944 Liver transplant status: Secondary | ICD-10-CM | POA: Diagnosis not present

## 2019-12-12 DIAGNOSIS — J90 Pleural effusion, not elsewhere classified: Secondary | ICD-10-CM | POA: Diagnosis not present

## 2019-12-12 DIAGNOSIS — N183 Chronic kidney disease, stage 3 unspecified: Secondary | ICD-10-CM | POA: Diagnosis not present

## 2019-12-12 DIAGNOSIS — R579 Shock, unspecified: Secondary | ICD-10-CM | POA: Diagnosis not present

## 2019-12-12 DIAGNOSIS — D62 Acute posthemorrhagic anemia: Secondary | ICD-10-CM | POA: Diagnosis not present

## 2019-12-12 DIAGNOSIS — I083 Combined rheumatic disorders of mitral, aortic and tricuspid valves: Secondary | ICD-10-CM | POA: Diagnosis not present

## 2019-12-12 DIAGNOSIS — Z79899 Other long term (current) drug therapy: Secondary | ICD-10-CM | POA: Diagnosis not present

## 2019-12-12 DIAGNOSIS — J9601 Acute respiratory failure with hypoxia: Secondary | ICD-10-CM | POA: Diagnosis not present

## 2019-12-12 DIAGNOSIS — R570 Cardiogenic shock: Secondary | ICD-10-CM | POA: Diagnosis not present

## 2019-12-12 DIAGNOSIS — J189 Pneumonia, unspecified organism: Secondary | ICD-10-CM | POA: Diagnosis not present

## 2019-12-12 DIAGNOSIS — I5021 Acute systolic (congestive) heart failure: Secondary | ICD-10-CM

## 2019-12-12 DIAGNOSIS — I25110 Atherosclerotic heart disease of native coronary artery with unstable angina pectoris: Secondary | ICD-10-CM | POA: Diagnosis not present

## 2019-12-12 DIAGNOSIS — Z87891 Personal history of nicotine dependence: Secondary | ICD-10-CM | POA: Diagnosis not present

## 2019-12-12 DIAGNOSIS — I214 Non-ST elevation (NSTEMI) myocardial infarction: Secondary | ICD-10-CM | POA: Diagnosis not present

## 2019-12-12 DIAGNOSIS — D7389 Other diseases of spleen: Secondary | ICD-10-CM | POA: Diagnosis not present

## 2019-12-12 DIAGNOSIS — Z4901 Encounter for fitting and adjustment of extracorporeal dialysis catheter: Secondary | ICD-10-CM | POA: Diagnosis not present

## 2019-12-12 DIAGNOSIS — E440 Moderate protein-calorie malnutrition: Secondary | ICD-10-CM | POA: Diagnosis present

## 2019-12-12 DIAGNOSIS — I5043 Acute on chronic combined systolic (congestive) and diastolic (congestive) heart failure: Secondary | ICD-10-CM | POA: Diagnosis not present

## 2019-12-12 DIAGNOSIS — F05 Delirium due to known physiological condition: Secondary | ICD-10-CM | POA: Diagnosis not present

## 2019-12-12 DIAGNOSIS — I255 Ischemic cardiomyopathy: Secondary | ICD-10-CM | POA: Diagnosis not present

## 2019-12-12 DIAGNOSIS — J9811 Atelectasis: Secondary | ICD-10-CM | POA: Diagnosis not present

## 2019-12-12 DIAGNOSIS — Q211 Atrial septal defect: Secondary | ICD-10-CM | POA: Diagnosis not present

## 2019-12-12 DIAGNOSIS — E872 Acidosis: Secondary | ICD-10-CM | POA: Diagnosis not present

## 2019-12-12 DIAGNOSIS — R079 Chest pain, unspecified: Secondary | ICD-10-CM | POA: Diagnosis not present

## 2019-12-12 DIAGNOSIS — I129 Hypertensive chronic kidney disease with stage 1 through stage 4 chronic kidney disease, or unspecified chronic kidney disease: Secondary | ICD-10-CM | POA: Diagnosis not present

## 2019-12-12 DIAGNOSIS — I517 Cardiomegaly: Secondary | ICD-10-CM | POA: Diagnosis not present

## 2019-12-12 DIAGNOSIS — N3289 Other specified disorders of bladder: Secondary | ICD-10-CM | POA: Diagnosis not present

## 2019-12-12 DIAGNOSIS — I314 Cardiac tamponade: Secondary | ICD-10-CM | POA: Diagnosis not present

## 2019-12-12 DIAGNOSIS — N186 End stage renal disease: Secondary | ICD-10-CM | POA: Diagnosis not present

## 2019-12-12 DIAGNOSIS — I471 Supraventricular tachycardia: Secondary | ICD-10-CM | POA: Diagnosis present

## 2019-12-12 DIAGNOSIS — Y95 Nosocomial condition: Secondary | ICD-10-CM | POA: Diagnosis not present

## 2019-12-12 DIAGNOSIS — I472 Ventricular tachycardia: Secondary | ICD-10-CM | POA: Diagnosis not present

## 2019-12-12 DIAGNOSIS — K3189 Other diseases of stomach and duodenum: Secondary | ICD-10-CM | POA: Diagnosis not present

## 2019-12-12 DIAGNOSIS — N170 Acute kidney failure with tubular necrosis: Secondary | ICD-10-CM | POA: Diagnosis not present

## 2019-12-12 HISTORY — PX: LEFT HEART CATH AND CORONARY ANGIOGRAPHY: CATH118249

## 2019-12-12 LAB — LIPID PANEL
Cholesterol: 219 mg/dL — ABNORMAL HIGH (ref 0–200)
HDL: 38 mg/dL — ABNORMAL LOW (ref >40)
LDL Cholesterol: 158 mg/dL — ABNORMAL HIGH (ref 0–99)
Total CHOL/HDL Ratio: 5.8 RATIO
Triglycerides: 117 mg/dL (ref <150)
VLDL: 23 mg/dL (ref 0–40)

## 2019-12-12 LAB — CBC
HCT: 40.5 % (ref 39.0–52.0)
Hemoglobin: 12.7 g/dL — ABNORMAL LOW (ref 13.0–17.0)
MCH: 26.1 pg (ref 26.0–34.0)
MCHC: 31.4 g/dL (ref 30.0–36.0)
MCV: 83.2 fL (ref 80.0–100.0)
Platelets: 288 10*3/uL (ref 150–400)
RBC: 4.87 MIL/uL (ref 4.22–5.81)
RDW: 14.9 % (ref 11.5–15.5)
WBC: 5.8 10*3/uL (ref 4.0–10.5)
nRBC: 0 % (ref 0.0–0.2)

## 2019-12-12 LAB — ECHOCARDIOGRAM COMPLETE
Height: 70 in
Weight: 2979.2 oz

## 2019-12-12 LAB — BASIC METABOLIC PANEL
Anion gap: 10 (ref 5–15)
BUN: 29 mg/dL — ABNORMAL HIGH (ref 8–23)
CO2: 25 mmol/L (ref 22–32)
Calcium: 8.5 mg/dL — ABNORMAL LOW (ref 8.9–10.3)
Chloride: 102 mmol/L (ref 98–111)
Creatinine, Ser: 2.04 mg/dL — ABNORMAL HIGH (ref 0.61–1.24)
GFR calc Af Amer: 39 mL/min — ABNORMAL LOW (ref >60)
GFR calc non Af Amer: 34 mL/min — ABNORMAL LOW (ref >60)
Glucose, Bld: 101 mg/dL — ABNORMAL HIGH (ref 70–99)
Potassium: 3.6 mmol/L (ref 3.5–5.1)
Sodium: 137 mmol/L (ref 135–145)

## 2019-12-12 LAB — HEPARIN LEVEL (UNFRACTIONATED): Heparin Unfractionated: 0.41 IU/mL (ref 0.30–0.70)

## 2019-12-12 LAB — PROTIME-INR
INR: 1 (ref 0.8–1.2)
Prothrombin Time: 13.2 seconds (ref 11.4–15.2)

## 2019-12-12 LAB — TROPONIN I (HIGH SENSITIVITY): Troponin I (High Sensitivity): 606 ng/L (ref <18)

## 2019-12-12 LAB — T4, FREE: Free T4: 1.19 ng/dL — ABNORMAL HIGH (ref 0.61–1.12)

## 2019-12-12 SURGERY — LEFT HEART CATH AND CORONARY ANGIOGRAPHY
Anesthesia: LOCAL

## 2019-12-12 MED ORDER — HEPARIN SODIUM (PORCINE) 1000 UNIT/ML IJ SOLN
INTRAMUSCULAR | Status: AC
  Filled 2019-12-12: qty 1

## 2019-12-12 MED ORDER — MIDAZOLAM HCL 2 MG/2ML IJ SOLN
INTRAMUSCULAR | Status: AC
  Filled 2019-12-12: qty 2

## 2019-12-12 MED ORDER — HEPARIN (PORCINE) IN NACL 1000-0.9 UT/500ML-% IV SOLN
INTRAVENOUS | Status: AC
  Filled 2019-12-12: qty 500

## 2019-12-12 MED ORDER — IOHEXOL 350 MG/ML SOLN
INTRAVENOUS | Status: DC | PRN
  Administered 2019-12-12: 75 mL

## 2019-12-12 MED ORDER — HYDRALAZINE HCL 20 MG/ML IJ SOLN: 10.0000 mg | INTRAMUSCULAR | Status: AC | PRN

## 2019-12-12 MED ORDER — SODIUM CHLORIDE 0.9 % IV SOLN: 250.0000 mL | INTRAVENOUS | Status: DC | PRN

## 2019-12-12 MED ORDER — VERAPAMIL HCL 2.5 MG/ML IV SOLN
INTRAVENOUS | Status: DC | PRN
  Administered 2019-12-12: 10 mL via INTRA_ARTERIAL

## 2019-12-12 MED ORDER — SODIUM CHLORIDE 0.9 % IV SOLN: INTRAVENOUS | Status: DC

## 2019-12-12 MED ORDER — LABETALOL HCL 5 MG/ML IV SOLN: 10.0000 mg | INTRAVENOUS | Status: AC | PRN

## 2019-12-12 MED ORDER — SODIUM CHLORIDE 0.9% FLUSH: 3.0000 mL | INTRAVENOUS | Status: DC | PRN

## 2019-12-12 MED ORDER — ACETAMINOPHEN 325 MG PO TABS: 650.0000 mg | ORAL_TABLET | ORAL | Status: DC | PRN

## 2019-12-12 MED ORDER — ASPIRIN 81 MG PO CHEW: 81.0000 mg | CHEWABLE_TABLET | ORAL | Status: DC

## 2019-12-12 MED ORDER — FENTANYL CITRATE (PF) 100 MCG/2ML IJ SOLN
INTRAMUSCULAR | Status: AC
  Filled 2019-12-12: qty 2

## 2019-12-12 MED ORDER — FENTANYL CITRATE (PF) 100 MCG/2ML IJ SOLN
INTRAMUSCULAR | Status: DC | PRN
  Administered 2019-12-12: 25 ug via INTRAVENOUS

## 2019-12-12 MED ORDER — HEPARIN (PORCINE) 25000 UT/250ML-% IV SOLN
1500.0000 [IU]/h | INTRAVENOUS | Status: DC
  Administered 2019-12-12: 1550 [IU]/h via INTRAVENOUS
  Administered 2019-12-13 – 2019-12-14 (×2): 1700 [IU]/h via INTRAVENOUS
  Administered 2019-12-14: 1450 [IU]/h via INTRAVENOUS
  Administered 2019-12-16: 1500 [IU]/h via INTRAVENOUS
  Administered 2019-12-16: 1450 [IU]/h via INTRAVENOUS
  Filled 2019-12-12 (×7): qty 250

## 2019-12-12 MED ORDER — MIDAZOLAM HCL 2 MG/2ML IJ SOLN
INTRAMUSCULAR | Status: DC | PRN
  Administered 2019-12-12: 2 mg via INTRAVENOUS

## 2019-12-12 MED ORDER — HEPARIN SODIUM (PORCINE) 1000 UNIT/ML IJ SOLN
INTRAMUSCULAR | Status: DC | PRN
  Administered 2019-12-12: 4000 [IU] via INTRAVENOUS

## 2019-12-12 MED ORDER — VERAPAMIL HCL 2.5 MG/ML IV SOLN
INTRAVENOUS | Status: AC
  Filled 2019-12-12: qty 2

## 2019-12-12 MED ORDER — OFF THE BEAT BOOK
Freq: Once | Status: AC
  Filled 2019-12-12: qty 1

## 2019-12-12 MED ORDER — HEPARIN (PORCINE) IN NACL 1000-0.9 UT/500ML-% IV SOLN
INTRAVENOUS | Status: DC | PRN
  Administered 2019-12-12 (×2): 500 mL

## 2019-12-12 MED ORDER — LIDOCAINE HCL (PF) 1 % IJ SOLN
INTRAMUSCULAR | Status: AC
  Filled 2019-12-12: qty 30

## 2019-12-12 MED ORDER — LIDOCAINE HCL (PF) 1 % IJ SOLN
INTRAMUSCULAR | Status: DC | PRN
  Administered 2019-12-12: 2 mL

## 2019-12-12 MED ORDER — SODIUM CHLORIDE 0.9% FLUSH
3.0000 mL | Freq: Two times a day (BID) | INTRAVENOUS | Status: DC
  Administered 2019-12-12 – 2019-12-16 (×4): 3 mL via INTRAVENOUS

## 2019-12-12 MED ORDER — SODIUM CHLORIDE 0.9 % IV BOLUS
250.0000 mL | Freq: Once | INTRAVENOUS | Status: AC
  Administered 2019-12-12: 250 mL via INTRAVENOUS

## 2019-12-12 MED ORDER — PERFLUTREN LIPID MICROSPHERE
1.0000 mL | INTRAVENOUS | Status: AC | PRN
  Administered 2019-12-12: 2 mL via INTRAVENOUS
  Filled 2019-12-12: qty 10

## 2019-12-12 MED ORDER — SODIUM CHLORIDE 0.9 % IV SOLN: INTRAVENOUS | Status: AC

## 2019-12-12 MED ORDER — ONDANSETRON HCL 4 MG/2ML IJ SOLN
4.0000 mg | Freq: Four times a day (QID) | INTRAMUSCULAR | Status: DC | PRN
  Administered 2019-12-13 – 2019-12-18 (×4): 4 mg via INTRAVENOUS
  Filled 2019-12-12 (×4): qty 2

## 2019-12-12 SURGICAL SUPPLY — 13 items
CATH 5FR JL3.5 JR4 ANG PIG MP (CATHETERS) ×1 IMPLANT
CATH INFINITI 5 FR AR1 MOD (CATHETERS) ×1 IMPLANT
CATH INFINITI 5FR AL1 (CATHETERS) ×1 IMPLANT
DEVICE RAD COMP TR BAND LRG (VASCULAR PRODUCTS) ×1 IMPLANT
GLIDESHEATH SLEND SS 6F .021 (SHEATH) ×1 IMPLANT
GUIDEWIRE INQWIRE 1.5J.035X260 (WIRE) IMPLANT
INQWIRE 1.5J .035X260CM (WIRE) ×2
KIT HEART LEFT (KITS) ×2 IMPLANT
PACK CARDIAC CATHETERIZATION (CUSTOM PROCEDURE TRAY) ×2 IMPLANT
SHEATH PROBE COVER 6X72 (BAG) ×1 IMPLANT
SYR MEDRAD MARK 7 150ML (SYRINGE) ×2 IMPLANT
TRANSDUCER W/STOPCOCK (MISCELLANEOUS) ×2 IMPLANT
TUBING CIL FLEX 10 FLL-RA (TUBING) ×2 IMPLANT

## 2019-12-12 NOTE — H&P (View-Only) (Signed)
Progress Note  Patient Name: Joseph Greer Date of Encounter: 12/12/2019  [facility] Cardiologist: Candee Furbish, MD [place] Porch new  Subjective   Recent panic attack, sensation of trouble breathing as outpatient prompted hospitalization. Hypoxia initially noted, he was diuresed about 10 pounds he states.  He has had a tough year, very stressful, wife, best friend, sister died. Sent here for left heart catheterization.  Pleasant currently.  No further shortness of breath.  Feels much better.  Inpatient Medications    Scheduled Meds: . Apremilast  1 tablet Oral BID  . aspirin EC  81 mg Oral Daily  . atorvastatin  80 mg Oral Daily  . metoprolol tartrate  25 mg Oral Q6H  . off the beat book   Does not apply Once  . tacrolimus  2 mg Oral BID  . venlafaxine XR  75 mg Oral Daily   Continuous Infusions: . sodium chloride    . diltiazem (CARDIZEM) infusion Stopped (12/12/19 0027)  . heparin 1,550 Units/hr (12/12/19 0026)   PRN Meds: acetaminophen, ALPRAZolam, nitroGLYCERIN, ondansetron (ZOFRAN) IV, zolpidem   Vital Signs    Vitals:   12/12/19 0135 12/12/19 0235 12/12/19 0635 12/12/19 0756  BP: 108/65 103/70  103/72  Pulse: (!) 29 68  64  Resp: (!) 23 20  20   Temp:   97.8 F (36.6 C) 97.9 F (36.6 C)  TempSrc:   Oral Oral  SpO2: 91% (!) 85%  97%  Weight:   84.5 kg   Height:        Intake/Output Summary (Last 24 hours) at 12/12/2019 0851 Last data filed at 12/12/2019 0357 Gross per 24 hour  Intake 69.59 ml  Output 250 ml  Net -180.41 ml   Last 3 Weights 12/12/2019 12/11/2019 05/25/2017  Weight (lbs) 186 lb 3.2 oz 185 lb 6.4 oz 203 lb  Weight (kg) 84.46 kg 84.097 kg 92.08 kg      Telemetry    Sinus rhythm/PAT noted on telemetry- Personally Reviewed  ECG    Sinus rhythm with deep T wave inversions, possibly out of proportion to LVH repolarization abnormalities- Personally Reviewed  Physical Exam   GEN: No acute distress.   Neck: No JVD Cardiac: RRR, no  murmurs, rubs, or gallops.  Occasional ectopy Respiratory: Clear to auscultation bilaterally. GI: Soft, nontender, non-distended  MS: No edema; No deformity.  Band-Aid noted on back from recent dermatologic procedure.  Has skin cancers removed frequently.  Says this is from his tacrolimus. Neuro:  Nonfocal  Psych: Normal affect   Labs    High Sensitivity Troponin:   Recent Labs  Lab 12/11/19 2140 12/11/19 2318  TROPONINIHS 669* 606*      Chemistry Recent Labs  Lab 12/11/19 2140 12/12/19 0559  NA 139 137  K 3.6 3.6  CL 105 102  CO2 22 25  GLUCOSE 155* 101*  BUN 27* 29*  CREATININE 1.91* 2.04*  CALCIUM 8.6* 8.5*  PROT 6.9  --   ALBUMIN 3.4*  --   AST 28  --   ALT 19  --   ALKPHOS 77  --   BILITOT 1.1  --   GFRNONAA 37* 34*  GFRAA 43* 39*  ANIONGAP 12 10     Hematology Recent Labs  Lab 12/11/19 2140 12/12/19 0559  WBC 6.6 5.8  RBC 4.97 4.87  HGB 13.0 12.7*  HCT 40.8 40.5  MCV 82.1 83.2  MCH 26.2 26.1  MCHC 31.9 31.4  RDW 14.7 14.9  PLT 302 288  BNPNo results for input(s): BNP, PROBNP in the last 168 hours.   DDimer No results for input(s): DDIMER in the last 168 hours.   Radiology    No results found.  Cardiac Studies   Troponin was 2 at outside hospital old assay, trending downward.  Echo previously in 2014 showed normal EF with mild aortic regurgitation.  Echocardiogram preliminarily here shows EF of 30% with inferolateral wall akinesis.  Mild mitral regurgitation.  Patient Profile     64 y.o. male transferred from [facility] after having shortness of breath fatigue elevated troponin trending downward. EF 30%--new. New acute systolic HF.   Assessment & Plan    Non-ST elevation myocardial infarction/paroxysmal atrial fibrillation -Telemetry originally does show atrial fibrillation with rapid ventricular response in the 130s to 140s range he converted at around midnight. -EKGs here demonstrate sinus rhythm with PACs.  Telemetry shows  sinus rhythm with occasional paroxysmal atrial tachycardia.  I do not see any atrial fibrillation currently thankfully.  Current troponin is 600. -Perhaps tachycardia has driven his troponin, but currently trending downward.  EKG however also concerning for generalized ischemia, worsening T wave inversions noted when compared to prior EKG from 2018. -I think it makes sense for [place] to proceed with left heart catheterization, dye sparing given his creatinine elevation of 1.9.  His creatinine in 2018 was 1.38. -Agree with metoprolol 25 every 6 hours for now we will consolidate later. -Echocardiogram as below -Mildly abnormal TSH, checking free T4. -Post catheterization/post procedure we will need to place him on anticoagulation, Eliquis. CHA2DS2-VASc potentially 3 with hypertension, recent heart failure, possible vascular disease. Should not have any contraindications with tacrolimus.  Acute systolic heart failure-EF 30% inferolateral akinesis -Likely ischemic in etiology.  Proceeding with heart catheterization.  Gently giving back some fluids for further renal protection however I do not want to be overzealous with this given the fact that his main complaint was shortness of breath and hypoxia on admission.  Liver transplant -Currently on tacrolimus.  No changes.  Prior LDL 72.  Chronic kidney disease stage IIIa -Creatinine 1.9, unsure of chronicity.  Was elevated previously in 2018 at approximately 1.4. On arrival at [facility] his creatinine was approximately 2.4. It is improving.  This morning 2.0.  He did get diuresed with IV Lasix. We will give him a gentle bolus of fluid prior to heart catheterization.  Remember, he was diuresed 10 pounds.  We are holding ACE inhibitor. I think it is fine to proceed with heart cath. Avoid LV gram. We are going to check echocardiogram.  For questions or updates, please contact [place] Please consult [email or web address] for contact info under         Signed, Candee Furbish, MD  12/12/2019, 8:51 AM

## 2019-12-12 NOTE — Progress Notes (Signed)
[place] for heparin > resume 8 hrs after cath Indication: chest pain/ACS  No Known Allergies  Patient Measurements: Height: 5\' 10"  (177.8 cm) Weight: 84.5 kg (186 lb 3.2 oz) IBW/kg (Calculated) : 73  Vital Signs: Temp: 97.9 F (36.6 C) (07/09 0756) Temp Source: Oral (07/09 0756) BP: 114/80 (07/09 0935) Pulse Rate: 70 (07/09 0935)  Labs: Recent Labs    12/11/19 2140 12/11/19 2318 12/12/19 0559  HGB 13.0  --  12.7*  HCT 40.8  --  40.5  PLT 302  --  288  LABPROT  --   --  13.2  INR  --   --  1.0  HEPARINUNFRC  --  0.20* 0.41  CREATININE 1.91*  --  2.04*  TROPONINIHS 669* 606*  --     Estimated Creatinine Clearance: 38.8 mL/min (A) (by C-G formula based on SCr of 2.04 mg/dL (H)).  Assessment: 64 yo m transferred from OSH on heparin 1350 units/hr for NSTEMI.  No AC PTA.  Heparin level at goal this morning. No issues with line or bleeding reported per RN.  Pharmacy asked to resume IV heparin 8 hrs after sheath out (removed at 1302 pm).  Awaiting CABG consult.  Goal of Therapy:  Heparin level 0.3-0.7 units/ml Monitor platelets by anticoagulation protocol: Yes   Plan:  Resume heparin at 1550 units/hr at 9 PM tonight. Heparin level 6 hrs after gtt restarts. Daily heparin level and CBC. F/u plans for CABG.  [place] Crane, Roylene Reason, BCCP Clinical Pharmacist  12/12/2019 1:58 PM   [facility] pharmacy phone numbers are listed on [email or web address]

## 2019-12-12 NOTE — Interval H&P Note (Signed)
Cath Lab Visit (complete for each Cath Lab visit)  Clinical Evaluation Leading to the Procedure:   ACS: Yes.    Non-ACS:    Anginal Classification: CCS IV  Anti-ischemic medical therapy: Minimal Therapy (1 class of medications)  Non-Invasive Test Results: No non-invasive testing performed  Prior CABG: No previous CABG      History and Physical Interval Note:  12/12/2019 12:02 PM  Joseph Greer  has presented today for surgery, with the diagnosis of nonstmei.  The various methods of treatment have been discussed with the patient and family. After consideration of risks, benefits and other options for treatment, the patient has consented to  Procedure(s): LEFT HEART CATH AND CORONARY ANGIOGRAPHY (N/A) as a surgical intervention.  The patient's history has been reviewed, patient examined, no change in status, stable for surgery.  I have reviewed the patient's chart and labs.  Questions were answered to the patient's satisfaction.     Larae Grooms

## 2019-12-12 NOTE — Progress Notes (Signed)
TCTS consulted for CABG evaluation. °

## 2019-12-12 NOTE — Progress Notes (Signed)
  Echocardiogram 2D Echocardiogram has been performed.  Jennette Dubin 12/12/2019, 9:59 AM

## 2019-12-12 NOTE — Progress Notes (Addendum)
Progress Note  Patient Name: Joseph Greer Date of Encounter: 12/12/2019  [facility] Cardiologist: Candee Furbish, MD [place] Porch new  Subjective   Recent panic attack, sensation of trouble breathing as outpatient prompted hospitalization. Hypoxia initially noted, he was diuresed about 10 pounds he states.  He has had a tough year, very stressful, wife, best friend, sister died. Sent here for left heart catheterization.  Pleasant currently.  No further shortness of breath.  Feels much better.  Inpatient Medications    Scheduled Meds:  Apremilast  1 tablet Oral BID   aspirin EC  81 mg Oral Daily   atorvastatin  80 mg Oral Daily   metoprolol tartrate  25 mg Oral Q6H   off the beat book   Does not apply Once   tacrolimus  2 mg Oral BID   venlafaxine XR  75 mg Oral Daily   Continuous Infusions:  sodium chloride     diltiazem (CARDIZEM) infusion Stopped (12/12/19 0027)   heparin 1,550 Units/hr (12/12/19 0026)   PRN Meds: acetaminophen, ALPRAZolam, nitroGLYCERIN, ondansetron (ZOFRAN) IV, zolpidem   Vital Signs    Vitals:   12/12/19 0135 12/12/19 0235 12/12/19 0635 12/12/19 0756  BP: 108/65 103/70  103/72  Pulse: (!) 29 68  64  Resp: (!) 23 20  20   Temp:   97.8 F (36.6 C) 97.9 F (36.6 C)  TempSrc:   Oral Oral  SpO2: 91% (!) 85%  97%  Weight:   84.5 kg   Height:        Intake/Output Summary (Last 24 hours) at 12/12/2019 0851 Last data filed at 12/12/2019 0357 Gross per 24 hour  Intake 69.59 ml  Output 250 ml  Net -180.41 ml   Last 3 Weights 12/12/2019 12/11/2019 05/25/2017  Weight (lbs) 186 lb 3.2 oz 185 lb 6.4 oz 203 lb  Weight (kg) 84.46 kg 84.097 kg 92.08 kg      Telemetry    Sinus rhythm/PAT noted on telemetry- Personally Reviewed  ECG    Sinus rhythm with deep T wave inversions, possibly out of proportion to LVH repolarization abnormalities- Personally Reviewed  Physical Exam   GEN: No acute distress.   Neck: No JVD Cardiac: RRR, no  murmurs, rubs, or gallops.  Occasional ectopy Respiratory: Clear to auscultation bilaterally. GI: Soft, nontender, non-distended  MS: No edema; No deformity.  Band-Aid noted on back from recent dermatologic procedure.  Has skin cancers removed frequently.  Says this is from his tacrolimus. Neuro:  Nonfocal  Psych: Normal affect   Labs    High Sensitivity Troponin:   Recent Labs  Lab 12/11/19 2140 12/11/19 2318  TROPONINIHS 669* 606*      Chemistry Recent Labs  Lab 12/11/19 2140 12/12/19 0559  NA 139 137  K 3.6 3.6  CL 105 102  CO2 22 25  GLUCOSE 155* 101*  BUN 27* 29*  CREATININE 1.91* 2.04*  CALCIUM 8.6* 8.5*  PROT 6.9  --   ALBUMIN 3.4*  --   AST 28  --   ALT 19  --   ALKPHOS 77  --   BILITOT 1.1  --   GFRNONAA 37* 34*  GFRAA 43* 39*  ANIONGAP 12 10     Hematology Recent Labs  Lab 12/11/19 2140 12/12/19 0559  WBC 6.6 5.8  RBC 4.97 4.87  HGB 13.0 12.7*  HCT 40.8 40.5  MCV 82.1 83.2  MCH 26.2 26.1  MCHC 31.9 31.4  RDW 14.7 14.9  PLT 302 288  BNPNo results for input(s): BNP, PROBNP in the last 168 hours.   DDimer No results for input(s): DDIMER in the last 168 hours.   Radiology    No results found.  Cardiac Studies   Troponin was 2 at outside hospital old assay, trending downward.  Echo previously in 2014 showed normal EF with mild aortic regurgitation.  Echocardiogram preliminarily here shows EF of 30% with inferolateral wall akinesis.  Mild mitral regurgitation.  Patient Profile     64 y.o. male transferred from [facility] after having shortness of breath fatigue elevated troponin trending downward. EF 30%--new. New acute systolic HF.   Assessment & Plan    Non-ST elevation myocardial infarction/paroxysmal atrial fibrillation -Telemetry originally does show atrial fibrillation with rapid ventricular response in the 130s to 140s range he converted at around midnight. -EKGs here demonstrate sinus rhythm with PACs.  Telemetry shows  sinus rhythm with occasional paroxysmal atrial tachycardia.  I do not see any atrial fibrillation currently thankfully.  Current troponin is 600. -Perhaps tachycardia has driven his troponin, but currently trending downward.  EKG however also concerning for generalized ischemia, worsening T wave inversions noted when compared to prior EKG from 2018. -I think it makes sense for [place] to proceed with left heart catheterization, dye sparing given his creatinine elevation of 1.9.  His creatinine in 2018 was 1.38. -Agree with metoprolol 25 every 6 hours for now we will consolidate later. -Echocardiogram as below -Mildly abnormal TSH, checking free T4. -Post catheterization/post procedure we will need to place him on anticoagulation, Eliquis. CHA2DS2-VASc potentially 3 with hypertension, recent heart failure, possible vascular disease. Should not have any contraindications with tacrolimus.  Acute systolic heart failure-EF 30% inferolateral akinesis -Likely ischemic in etiology.  Proceeding with heart catheterization.  Gently giving back some fluids for further renal protection however I do not want to be overzealous with this given the fact that his main complaint was shortness of breath and hypoxia on admission.  Liver transplant -Currently on tacrolimus.  No changes.  Prior LDL 72.  Chronic kidney disease stage IIIa -Creatinine 1.9, unsure of chronicity.  Was elevated previously in 2018 at approximately 1.4. On arrival at [facility] his creatinine was approximately 2.4. It is improving.  This morning 2.0.  He did get diuresed with IV Lasix. We will give him a gentle bolus of fluid prior to heart catheterization.  Remember, he was diuresed 10 pounds.  We are holding ACE inhibitor. I think it is fine to proceed with heart cath. Avoid LV gram. We are going to check echocardiogram.  For questions or updates, please contact [place] Please consult [email or web address] for contact info under         Signed, Candee Furbish, MD  12/12/2019, 8:51 AM

## 2019-12-12 NOTE — Plan of Care (Signed)

## 2019-12-12 NOTE — Progress Notes (Signed)
   12/11/19 2017  Assess: MEWS Score  Temp 97.9 F (36.6 C)  BP 123/88  Pulse Rate (!) 136  Resp 20  SpO2 98 %  O2 Device Nasal Cannula  O2 Flow Rate (L/min) 2 L/min  Assess: MEWS Score  MEWS Temp 0  MEWS Systolic 0  MEWS Pulse 3  MEWS RR 0  MEWS LOC 0  MEWS Score 3  MEWS Score Color Yellow  Assess: if the MEWS score is Yellow or Red  Were vital signs taken at a resting state? Yes  Focused Assessment Documented focused assessment  Early Detection of Sepsis Score *See Row Information* Low  MEWS guidelines implemented *See Row Information* Yes  Treat  MEWS Interventions Administered scheduled meds/treatments;Escalated (See documentation below) (Cardizem gtt increased to 10mg )  Take Vital Signs  Increase Vital Sign Frequency  Yellow: Q 2hr X 2 then Q 4hr X 2, if remains yellow, continue Q 4hrs  Escalate  MEWS: Escalate Yellow: discuss with charge nurse/RN and consider discussing with provider and RRT  Notify: Charge Nurse/RN  Name of Charge Nurse/RN Notified Chanda Busing, RN  Date Charge Nurse/RN Notified 12/11/19  Time Charge Nurse/RN Notified 2037  Notify: Provider  Provider Name/Title Dr. Alveta Heimlich  Date Provider Notified 12/11/19  Time Provider Notified 2030  Notification Type Page  Notification Reason Other (Comment) (Pt's arrival to room from [facility])  Response See new orders (MD to bedside at 2100)  Date of Provider Response 12/11/19  Time of Provider Response 2100  Document  Patient Outcome Stabilized after interventions (Converted to NSR at 2324 with cardizem gtt and po metoprolol)  Progress note created (see row info) Yes

## 2019-12-12 NOTE — Progress Notes (Signed)
ANTICOAGULATION CONSULT NOTE  Pharmacy Consult for heparin Indication: chest pain/ACS  No Known Allergies  Patient Measurements: Height: 5\' 10"  (177.8 cm) Weight: 84.5 kg (186 lb 3.2 oz) IBW/kg (Calculated) : 73  Vital Signs: Temp: 97.9 F (36.6 C) (07/09 0756) Temp Source: Oral (07/09 0756) BP: 114/80 (07/09 0935) Pulse Rate: 70 (07/09 0935)  Labs: Recent Labs    12/11/19 2140 12/11/19 2318 12/12/19 0559  HGB 13.0  --  12.7*  HCT 40.8  --  40.5  PLT 302  --  288  LABPROT  --   --  13.2  INR  --   --  1.0  HEPARINUNFRC  --  0.20* 0.41  CREATININE 1.91*  --  2.04*  TROPONINIHS 669* 606*  --     Estimated Creatinine Clearance: 38.8 mL/min (A) (by C-G formula based on SCr of 2.04 mg/dL (H)).  Assessment: 64 yo m transferred from OSH on heparin 1350 units/hr for NSTEMI.  No AC PTA.  Heparin level at goal this morning. No issues with line or bleeding reported per RN.  Goal of Therapy:  Heparin level 0.3-0.7 units/ml Monitor platelets by anticoagulation protocol: Yes   Plan:  Increase heparin to 1550 units/hr F/u plans for anticoagulation after cath today.  Will likely need DOAC for new afib per discussion with Dr. Leota Jacobsen, BCPS, [facility] Clinical Pharmacist  12/12/2019 10:58 AM   [facility] pharmacy phone numbers are listed on [email or web address]

## 2019-12-13 LAB — CBC
HCT: 37.5 % — ABNORMAL LOW (ref 39.0–52.0)
Hemoglobin: 11.9 g/dL — ABNORMAL LOW (ref 13.0–17.0)
MCH: 26.6 pg (ref 26.0–34.0)
MCHC: 31.7 g/dL (ref 30.0–36.0)
MCV: 83.9 fL (ref 80.0–100.0)
Platelets: 262 10*3/uL (ref 150–400)
RBC: 4.47 MIL/uL (ref 4.22–5.81)
RDW: 14.8 % (ref 11.5–15.5)
WBC: 6.8 10*3/uL (ref 4.0–10.5)
nRBC: 0 % (ref 0.0–0.2)

## 2019-12-13 LAB — HEPARIN LEVEL (UNFRACTIONATED)
Heparin Unfractionated: 0.23 IU/mL — ABNORMAL LOW (ref 0.30–0.70)
Heparin Unfractionated: 0.64 IU/mL (ref 0.30–0.70)

## 2019-12-13 LAB — T3, FREE: T3, Free: 2.6 pg/mL (ref 2.0–4.4)

## 2019-12-13 NOTE — Progress Notes (Signed)
ANTICOAGULATION CONSULT NOTE - Follow Up Consult  Pharmacy Consult for heparin Indication: CAD awaiting CABG  Labs: Recent Labs    12/11/19 2140 12/11/19 2140 12/11/19 2318 12/12/19 0559 12/13/19 0544  HGB 13.0   < >  --  12.7* 11.9*  HCT 40.8  --   --  40.5 37.5*  PLT 302  --   --  288 262  LABPROT  --   --   --  13.2  --   INR  --   --   --  1.0  --   HEPARINUNFRC  --   --  0.20* 0.41 0.23*  CREATININE 1.91*  --   --  2.04*  --   TROPONINIHS 669*  --  606*  --   --    < > = values in this interval not displayed.    Assessment: 63yo male subtherapeutic on heparin after resumed post-cath; no gtt issues or signs of bleeding per RN.  Goal of Therapy:  Heparin level 0.3-0.7 units/ml   Plan:  Will increase heparin gtt by 2 units/kg/hr to 1700 units/hr and check level in 8 hours.    Wynona Neat, PharmD, BCPS   12/13/2019,6:40 AM

## 2019-12-13 NOTE — Progress Notes (Signed)
Progress Note  Patient Name: Joseph Greer Date of Encounter: 12/13/2019  [facility] Cardiologist: Marlou Porch ? F/u in [place]   Subjective   No angina  Waiting for CVTS consult   Inpatient Medications    Scheduled Meds: . Apremilast  1 tablet Oral BID  . aspirin EC  81 mg Oral Daily  . atorvastatin  80 mg Oral Daily  . metoprolol tartrate  25 mg Oral Q6H  . sodium chloride flush  3 mL Intravenous Q12H  . tacrolimus  2 mg Oral BID  . venlafaxine XR  75 mg Oral Daily   Continuous Infusions: . sodium chloride    . diltiazem (CARDIZEM) infusion Stopped (12/12/19 0027)  . heparin 1,700 Units/hr (12/13/19 0651)   PRN Meds: sodium chloride, acetaminophen, ALPRAZolam, nitroGLYCERIN, ondansetron (ZOFRAN) IV, sodium chloride flush, zolpidem   Vital Signs    Vitals:   12/12/19 1624 12/12/19 1942 12/13/19 0424 12/13/19 0807  BP: 119/83 (!) 116/92 123/79 118/83  Pulse: 84 82 82 89  Resp: 20 20 20 20   Temp: 98.2 F (36.8 C) 97.6 F (36.4 C) 98 F (36.7 C) (!) 97.4 F (36.3 C)  TempSrc: Oral Oral Oral Oral  SpO2: 96% 96% 98% 98%  Weight:      Height:        Intake/Output Summary (Last 24 hours) at 12/13/2019 0841 Last data filed at 12/13/2019 9833 Gross per 24 hour  Intake 665.66 ml  Output 525 ml  Net 140.66 ml   Last 3 Weights 12/12/2019 12/11/2019 05/25/2017  Weight (lbs) 186 lb 3.2 oz 185 lb 6.4 oz 203 lb  Weight (kg) 84.46 kg 84.097 kg 92.08 kg      Telemetry    Sinus rhythm/PAT noted on telemetry- Personally Reviewed  ECG    Sinus rhythm with deep T wave inversions, possibly out of proportion to LVH repolarization abnormalities- Personally Reviewed  Physical Exam   Affect appropriate Healthy:  appears stated age HEENT: normal Neck supple with no adenopathy JVP normal no bruits no thyromegaly Lungs clear with no wheezing and good diaphragmatic motion Heart:  S1/S2 no murmur, no rub, gallop or click PMI normal Abdomen: benighn, BS positve, no  tenderness, no AAA no bruit.  No HSM or HJR post liver transplant  Distal pulses intact with no bruits No edema Neuro non-focal Skin warm and dry No muscular weakness Right radial cath site A   Labs    High Sensitivity Troponin:   Recent Labs  Lab 12/11/19 2140 12/11/19 2318  TROPONINIHS 669* 606*      Chemistry Recent Labs  Lab 12/11/19 2140 12/12/19 0559  NA 139 137  K 3.6 3.6  CL 105 102  CO2 22 25  GLUCOSE 155* 101*  BUN 27* 29*  CREATININE 1.91* 2.04*  CALCIUM 8.6* 8.5*  PROT 6.9  --   ALBUMIN 3.4*  --   AST 28  --   ALT 19  --   ALKPHOS 77  --   BILITOT 1.1  --   GFRNONAA 37* 34*  GFRAA 43* 39*  ANIONGAP 12 10     Hematology Recent Labs  Lab 12/11/19 2140 12/12/19 0559 12/13/19 0544  WBC 6.6 5.8 6.8  RBC 4.97 4.87 4.47  HGB 13.0 12.7* 11.9*  HCT 40.8 40.5 37.5*  MCV 82.1 83.2 83.9  MCH 26.2 26.1 26.6  MCHC 31.9 31.4 31.7  RDW 14.7 14.9 14.8  PLT 302 288 262      Radiology    CARDIAC CATHETERIZATION  Addendum Date: 12/12/2019    Dist RCA lesion is 100% stenosed. Faint left to right and right to right collaterals.  Ramus lesion is 95% stenosed.  1st Diag lesion is 75% stenosed.  Prox Cx lesion is 99% stenosed. THis is the culprit lesion. TIMI 2 flow.  Ost LAD to Prox LAD lesion is 60% stenosed. Complex lesion with shelf of plaque.  Mid LAD lesion is 80% stenosed.  LV end diastolic pressure is moderately elevated.  There is no aortic valve stenosis.  Plan for cardiac surgery consult.   Result Date: 12/12/2019  Dist RCA lesion is 100% stenosed. Faint left to right and right to right collaterals.  Ramus lesion is 95% stenosed.  1st Diag lesion is 75% stenosed.  Prox Cx lesion is 99% stenosed. THis is the culprit lesion. TIMI 2 flow.  Ost LAD to Prox LAD lesion is 60% stenosed. Complex lesion with shelf of plaque.  Mid LAD lesion is 80% stenosed.  LV end diastolic pressure is moderately elevated.  There is no aortic valve stenosis.   Plan for cardiac surgery consult.   ECHOCARDIOGRAM COMPLETE  Result Date: 12/12/2019    ECHOCARDIOGRAM REPORT   Patient Name:   Joseph Greer [facility] Date of Exam: 12/12/2019 Medical Rec #:  [id number]            Height:       70.0 in Accession #:    [id number]           Weight:       186.2 lb Date of Birth:  05-05-1956            BSA:          2.025 m Patient Age:    64 years             BP:           114/80 mmHg Patient Gender: M                    HR:           83 bpm. Exam Location:  Inpatient Procedure: 2D Echo and Intracardiac Opacification Agent Indications:    NSTEMI I21.4  History:        Patient has prior history of Echocardiogram examinations, most                 recent 03/27/2013. Risk Factors:Hypertension and Diabetes.  Sonographer:    Mikki Santee RDCS (AE) Referring Phys: [place]  1. LVEF is depressed with severe hypokinesis in all segments except septum. . Left ventricular ejection fraction, by estimation, is 20 to 25%. The left ventricle has severely decreased function. The left ventricular internal cavity size was severely dilated. Left ventricular diastolic parameters are indeterminate.  2. Right ventricular systolic function is moderately reduced. The right ventricular size is normal.  3. Left atrial size was mild to moderately dilated.  4. Turbulent flow along intraatrial septum suspicious for PFO . Evidence of atrial level shunting detected by color flow Doppler.  5. The mitral valve is abnormal. Mild to moderate mitral valve regurgitation.  6. The aortic valve is normal in structure. Aortic valve regurgitation is trivial. FINDINGS  Left Ventricle: LVEF is depressed with severe hypokinesis in all segments except septum. Left ventricular ejection fraction, by estimation, is 20 to 25%. The left ventricle has severely decreased function. The left ventricle demonstrates regional wall motion abnormalities. Definity contrast agent was given IV to delineate the left  ventricular endocardial borders. The left ventricular internal cavity size was severely dilated. There is no left ventricular hypertrophy. Left ventricular diastolic parameters are indeterminate. Right Ventricle: The right ventricular size is normal. No increase in right ventricular wall thickness. Right ventricular systolic function is moderately reduced. Left Atrium: Left atrial size was mild to moderately dilated. Right Atrium: Right atrial size was normal in size. Pericardium: There is no evidence of pericardial effusion. Mitral Valve: The mitral valve is abnormal. There is mild thickening of the mitral valve leaflet(s). Mild to moderate mitral annular calcification. Mild to moderate mitral valve regurgitation. Tricuspid Valve: The tricuspid valve is normal in structure. Tricuspid valve regurgitation is trivial. Aortic Valve: The aortic valve is normal in structure. Aortic valve regurgitation is trivial. Pulmonic Valve: The pulmonic valve was normal in structure. Pulmonic valve regurgitation is not visualized. Aorta: The aortic root was not well visualized. IAS/Shunts: Evidence of atrial level shunting detected by color flow Doppler.  LEFT VENTRICLE PLAX 2D LVIDd:         6.30 cm LVIDs:         5.70 cm LV PW:         1.00 cm LV IVS:        1.00 cm LVOT diam:     2.50 cm LV SV:         62 LV SV Index:   31 LVOT Area:     4.91 cm  LV Volumes (MOD) LV vol d, MOD A2C: 131.0 ml LV vol d, MOD A4C: 211.0 ml LV vol s, MOD A2C: 90.3 ml LV vol s, MOD A4C: 162.0 ml LV SV MOD A2C:     40.7 ml LV SV MOD A4C:     211.0 ml LV SV MOD BP:      49.2 ml RIGHT VENTRICLE RV S prime:     9.90 cm/s TAPSE (M-mode): 1.1 cm LEFT ATRIUM              Index       RIGHT ATRIUM           Index LA diam:        5.20 cm  2.57 cm/m  RA Area:     14.60 cm LA Vol (A2C):   110.0 ml 54.32 ml/m RA Volume:   32.50 ml  16.05 ml/m LA Vol (A4C):   83.7 ml  41.34 ml/m LA Biplane Vol: 96.7 ml  47.76 ml/m  AORTIC VALVE LVOT Vmax:   59.60 cm/s LVOT  Vmean:  44.400 cm/s LVOT VTI:    0.126 m  AORTA Ao Root diam: 3.60 cm MITRAL VALVE MV Area (PHT): 6.32 cm    SHUNTS MV Decel Time: 120 msec    Systemic VTI:  0.13 m MR Peak grad: 52.4 mmHg    Systemic Diam: 2.50 cm MR Mean grad: 36.0 mmHg MR Vmax:      362.00 cm/s MR Vmean:     287.0 cm/s MV E velocity: 86.80 cm/s Dorris Carnes MD Electronically signed by Dorris Carnes MD Signature Date/Time: 12/12/2019/11:46:49 AM    Final     Cardiac Studies   Troponin was 2 at outside hospital old assay, trending downward.  Echo previously in 2014 showed normal EF with mild aortic regurgitation.  EchocardiogramEF 25-30% mild MR   Patient Profile     64 y.o. male transferred from [facility] after having shortness of breath fatigue elevated troponin trending downward. EF 30%--new. New acute systolic HF.   Assessment & Plan  Non-ST elevation myocardial infarction/paroxysmal atrial fibrillation - cath with surgical disease waiting for CVTS consult will order pre CABG dopplers   Acute systolic heart failure-EF 30% inferolateral akinesis -continue ACE/diuretics   Liver transplant -Currently on tacrolimus.  No changes.  Prior LDL 72.  Chronic kidney disease stage IIIa -Creatinine 2.04 will check in am   For questions or updates, please contact [place] Please consult [email or web address] for contact info under        Signed, Jenkins Rouge, MD  12/13/2019, 8:41 AM

## 2019-12-13 NOTE — Progress Notes (Signed)
CARDIAC REHAB PHASE I   PRE:  Rate/Rhythm: 82 SR  BP:  Supine:   Sitting: 121/81  Standing:    SaO2: 96% RA  MODE:  Ambulation: 470 ft   POST:  Rate/Rhythm: 115 irregular rhytym  BP:  Supine:   Sitting: 130/67  Standing:    SaO2: 95% RA  1443-1540 Patient eager to walk. Patient tolerated ambulation well with assist x1, gait steady. Patient denies chest pain, denies SOB. Patient c/o bilateral leg soreness during walk. To chair after walk, IV intact, call bell within reach.   Sol Passer, MS, ACSM CEP

## 2019-12-13 NOTE — Progress Notes (Signed)
ANTICOAGULATION CONSULT NOTE  Pharmacy Consult for heparin Indication: chest pain/ACS  No Known Allergies  Patient Measurements: Height: 5\' 10"  (177.8 cm) Weight: 84.5 kg (186 lb 3.2 oz) IBW/kg (Calculated) : 73  Vital Signs: Temp: 97.4 F (36.3 C) (07/10 1214) Temp Source: Oral (07/10 1214) BP: 121/83 (07/10 1214) Pulse Rate: 79 (07/10 1214)  Labs: Recent Labs    12/11/19 2140 12/11/19 2318 12/11/19 2318 12/12/19 0559 12/13/19 0544 12/13/19 1450  HGB 13.0  --    < > 12.7* 11.9*  --   HCT 40.8  --   --  40.5 37.5*  --   PLT 302  --   --  288 262  --   LABPROT  --   --   --  13.2  --   --   INR  --   --   --  1.0  --   --   HEPARINUNFRC  --  0.20*   < > 0.41 0.23* 0.64  CREATININE 1.91*  --   --  2.04*  --   --   TROPONINIHS 669* 606*  --   --   --   --    < > = values in this interval not displayed.    Estimated Creatinine Clearance: 38.8 mL/min (A) (by C-G formula based on SCr of 2.04 mg/dL (H)).  Assessment: 64 yo m transferred from OSH on heparin 1350 units/hr for NSTEMI.  No AC PTA.  Heparin level at goal this evening on 1700 units/hr . No issues with line or bleeding reported per RN.  Awaiting CABG consult.  Goal of Therapy:  Heparin level 0.3-0.7 units/ml Monitor platelets by anticoagulation protocol: Yes   Plan:  Heparin at 1700 units/hr Daily heparin level and CBC. F/u plans for CABG.  Erin Hearing PharmD., BCPS Clinical Pharmacist 12/13/2019 4:03 PM

## 2019-12-14 DIAGNOSIS — I509 Heart failure, unspecified: Secondary | ICD-10-CM

## 2019-12-14 DIAGNOSIS — I25110 Atherosclerotic heart disease of native coronary artery with unstable angina pectoris: Secondary | ICD-10-CM

## 2019-12-14 LAB — BASIC METABOLIC PANEL
Anion gap: 9 (ref 5–15)
BUN: 29 mg/dL — ABNORMAL HIGH (ref 8–23)
CO2: 24 mmol/L (ref 22–32)
Calcium: 8.7 mg/dL — ABNORMAL LOW (ref 8.9–10.3)
Chloride: 106 mmol/L (ref 98–111)
Creatinine, Ser: 1.85 mg/dL — ABNORMAL HIGH (ref 0.61–1.24)
GFR calc Af Amer: 44 mL/min — ABNORMAL LOW (ref >60)
GFR calc non Af Amer: 38 mL/min — ABNORMAL LOW (ref >60)
Glucose, Bld: 97 mg/dL (ref 70–99)
Potassium: 3.7 mmol/L (ref 3.5–5.1)
Sodium: 139 mmol/L (ref 135–145)

## 2019-12-14 LAB — CBC
HCT: 37.3 % — ABNORMAL LOW (ref 39.0–52.0)
Hemoglobin: 11.5 g/dL — ABNORMAL LOW (ref 13.0–17.0)
MCH: 25.7 pg — ABNORMAL LOW (ref 26.0–34.0)
MCHC: 30.8 g/dL (ref 30.0–36.0)
MCV: 83.4 fL (ref 80.0–100.0)
Platelets: 222 10*3/uL (ref 150–400)
RBC: 4.47 MIL/uL (ref 4.22–5.81)
RDW: 14.7 % (ref 11.5–15.5)
WBC: 5.9 10*3/uL (ref 4.0–10.5)
nRBC: 0 % (ref 0.0–0.2)

## 2019-12-14 LAB — HEPARIN LEVEL (UNFRACTIONATED)
Heparin Unfractionated: 0.7 IU/mL (ref 0.30–0.70)
Heparin Unfractionated: 0.83 IU/mL — ABNORMAL HIGH (ref 0.30–0.70)
Heparin Unfractionated: 0.64 IU/mL (ref 0.30–0.70)

## 2019-12-14 NOTE — Progress Notes (Signed)
ANTICOAGULATION CONSULT NOTE  Pharmacy Consult for heparin Indication: chest pain/ACS  No Known Allergies  Patient Measurements: Height: 5\' 10"  (177.8 cm) Weight: 85.9 kg (189 lb 6.4 oz) IBW/kg (Calculated) : 73  Vital Signs: Temp: 97.7 F (36.5 C) (07/11 0511) Temp Source: Oral (07/11 0511) BP: 110/83 (07/11 0511) Pulse Rate: 75 (07/11 0511)  Labs: Recent Labs    12/11/19 2140 12/11/19 2140 12/11/19 2318 12/12/19 0559 12/12/19 0559 12/13/19 0544 12/13/19 1450 12/14/19 0354  HGB 13.0   < >  --  12.7*   < > 11.9*  --  11.5*  HCT 40.8   < >  --  40.5  --  37.5*  --  37.3*  PLT 302   < >  --  288  --  262  --  222  LABPROT  --   --   --  13.2  --   --   --   --   INR  --   --   --  1.0  --   --   --   --   HEPARINUNFRC  --    < > 0.20* 0.41   < > 0.23* 0.64 0.70  CREATININE 1.91*  --   --  2.04*  --   --   --  1.85*  TROPONINIHS 669*  --  606*  --   --   --   --   --    < > = values in this interval not displayed.    Estimated Creatinine Clearance: 42.7 mL/min (A) (by C-G formula based on SCr of 1.85 mg/dL (H)).  Assessment: 64 yo m transferred from OSH on heparin 1350 units/hr for NSTEMI.  No AC PTA.  Heparin level is 0.7 (at upper end of goal) this morning on 1700 units/hr. CBC is stable. No bleeding or unusual bruising per patient.   Awaiting CABG consult.  Goal of Therapy:  Heparin level 0.3-0.7 units/ml Monitor platelets by anticoagulation protocol: Yes   Plan:  Decrease heparin rate to 1650 units/hr Recheck heparin level in 6 hours, monitor daily CBC. F/u plans for CABG.  Rebbeca Paul, PharmD PGY1 Pharmacy Resident 12/14/2019 8:40 AM  Please check [email or web address] for unit-specific pharmacy phone numbers.

## 2019-12-14 NOTE — Consult Note (Signed)
[address]       [place] [address]             [phone]        Hrithik J Goetze [place] Medical Record #[id number] Date of Birth: 1956/02/15  Referring: No ref. provider found Primary Care: Monico Blitz, MD Primary Cardiologist:Mark Marlou Porch, MD  Chief Complaint:   No chief complaint on file.   History of Present Illness:     64 year old male transferred from [facility] for evaluation of progressive exertional angina.  He underwent a left heart cath on 12/12/2019 which showed severe three-vessel coronary disease.  Additionally he had an echocardiogram which showed an EF of 20 to 25%.  In regards to his symptoms he states that he has had some paroxysmal nocturnal dyspnea for the past several months, but on Tuesday of last week while swimming he experienced some chest pain and shortness of breath.  He normally works out regularly and this is the first episode that he has had where he was unable to complete his work-up.  He has a history of liver transplant for hepatitis C in 2009, and has had no complications from that.  He is currently on Prograf, and has not been on CellCept or steroids for a long time.  He denies any lower extremity swelling, or orthopnea.  He has had some palpitations, and was noted to be in atrial fibrillation at his initial presentation.   Past Medical and Surgical History: Previous Chest Surgery: No Previous Chest Radiation: No Diabetes Mellitus: No.  HbA1C 5.9 Creatinine: 1.85 down from 2.04  Past Medical History:  Diagnosis Date   Anxiety    CKD (chronic kidney disease), stage III    Depression    Diabetes mellitus type 2, noninsulin dependent ([place])    Hepatitis C    Hypertension    Psoriasis    Status post liver transplant ([place])     Past Surgical History:  Procedure Laterality Date   COLONOSCOPY     COLONOSCOPY WITH PROPOFOL N/A 06/01/2017   Procedure: COLONOSCOPY WITH PROPOFOL;  Surgeon: Rogene Houston, MD;   Location: [facility];  Service: Endoscopy;  Laterality: N/A;  7:30   HERNIA REPAIR Right    LIVER BIOPSY     LIVER SURGERY     POLYPECTOMY  06/01/2017   Procedure: POLYPECTOMY;  Surgeon: Rogene Houston, MD;  Location: [facility];  Service: Endoscopy;;  polyp at sigmoid colon x2, ascending colon polyp, hepatic flexure polyp   UPPER GASTROINTESTINAL ENDOSCOPY      Social History: Support: Widowed.  Lives with his son  Social History   Tobacco Use  Smoking Status Former Smoker   Packs/day: 1.00   Types: Cigarettes   Quit date: 09/04/2007   Years since quitting: 12.2  Smokeless Tobacco Never Used    Social History   Substance and Sexual Activity  Alcohol Use No     No Known Allergies  Medications: Asprin: Yes Statin: Yes Beta Blocker: Yes Ace Inhibitor: No Anti-Coagulation: Currently on heparin drip  Current Facility-Administered Medications  Medication Dose Route Frequency Provider Last Rate Last Admin   0.9 %  sodium chloride infusion  250 mL Intravenous PRN Jettie Booze, MD       acetaminophen (TYLENOL) tablet 650 mg  650 mg Oral Q4H PRN Jettie Booze, MD       ALPRAZolam Duanne Moron) tablet 0.25 mg  0.25 mg Oral BID PRN Jettie Booze, MD  0.25 mg at 12/13/19 1008   Apremilast TABS 30 mg  1 tablet Oral BID Jettie Booze, MD   30 mg at 12/14/19 1039   aspirin EC tablet 81 mg  81 mg Oral Daily Jettie Booze, MD   81 mg at 12/14/19 1040   atorvastatin (LIPITOR) tablet 80 mg  80 mg Oral Daily Jettie Booze, MD   80 mg at 12/14/19 1043   diltiazem (CARDIZEM) 125 mg in dextrose 5% 125 mL (1 mg/mL) infusion  5-15 mg/hr Intravenous Titrated Jettie Booze, MD   Stopped at 12/12/19 0027   heparin ADULT infusion 100 units/mL (25000 units/261mL sodium chloride 0.45%)  1,650 Units/hr Intravenous Continuous Rebbeca Paul B, RPH 16.5 mL/hr at 12/14/19 0936 1,650 Units/hr at 12/14/19 0936   metoprolol tartrate  (LOPRESSOR) tablet 25 mg  25 mg Oral Q6H Jettie Booze, MD   25 mg at 12/14/19 1039   nitroGLYCERIN (NITROSTAT) SL tablet 0.4 mg  0.4 mg Sublingual Q5 Min x 3 PRN Jettie Booze, MD       ondansetron [facility]) injection 4 mg  4 mg Intravenous Q6H PRN Jettie Booze, MD   4 mg at 12/14/19 1043   sodium chloride flush (NS) 0.9 % injection 3 mL  3 mL Intravenous Q12H Larae Grooms S, MD   3 mL at 12/13/19 2204   sodium chloride flush (NS) 0.9 % injection 3 mL  3 mL Intravenous PRN Jettie Booze, MD       tacrolimus (PROGRAF) capsule 2 mg  2 mg Oral BID Larae Grooms S, MD   2 mg at 12/14/19 1039   venlafaxine XR (EFFEXOR-XR) 24 hr capsule 75 mg  75 mg Oral Daily Jettie Booze, MD   75 mg at 12/14/19 1039   zolpidem (AMBIEN) tablet 5 mg  5 mg Oral QHS PRN,MR X 1 Jettie Booze, MD   5 mg at 12/13/19 2206    Medications Prior to Admission  Medication Sig Dispense Refill Last Dose   amLODipine (NORVASC) 5 MG tablet Take 5 mg by mouth daily.   12/11/2019 at Unknown time   Apremilast (OTEZLA) 30 MG TABS Take 1 tablet by mouth 2 (two) times daily.   12/11/2019 at Unknown time   LORazepam (ATIVAN) 1 MG tablet Take 1 mg by mouth 2 (two) times daily as needed (panic attacks).    12/11/2019 at Unknown time   nitroGLYCERIN (NITROSTAT) 0.4 MG SL tablet Place 1 tablet (0.4 mg total) under the tongue every 5 (five) minutes x 3 doses as needed for chest pain. 30 tablet 12 several years   tacrolimus (PROGRAF) 1 MG capsule Take 2 mg by mouth 2 (two) times daily.    12/11/2019 at Unknown time   venlafaxine XR (EFFEXOR-XR) 75 MG 24 hr capsule Take 75 mg by mouth daily.   12/11/2019 at Unknown time   buPROPion (WELLBUTRIN) 75 MG tablet Take 75 mg by mouth 2 (two) times daily.  (Patient not taking: Reported on 12/11/2019)   Not Taking at Unknown time   FLUoxetine (PROZAC) 40 MG capsule Take 40 mg by mouth daily. (Patient not taking: Reported on 12/11/2019)   Not Taking at  Unknown time   lisinopril (PRINIVIL,ZESTRIL) 20 MG tablet Take 20 mg by mouth daily. (Patient not taking: Reported on 12/11/2019)   Not Taking at Unknown time   mycophenolate (CELLCEPT) 250 MG capsule Take 250 mg by mouth 2 (two) times daily.  (Patient not taking: Reported on 12/11/2019)  Not Taking at Unknown time   triamcinolone ointment (KENALOG) 0.1 % Apply 1 application topically 2 (two) times daily. (Patient not taking: Reported on 12/11/2019)   Completed Course at Unknown time    Family History  Problem Relation Age of Onset   Diabetes Mother    Heart disease Father    Heart disease Son      Review of Systems:   Review of Systems  Constitutional: Positive for malaise/fatigue.  HENT: Negative.   Respiratory: Positive for shortness of breath. Negative for cough.   Cardiovascular: Positive for chest pain, palpitations and PND. Negative for orthopnea.  Gastrointestinal: Negative.   Musculoskeletal: Negative.   Neurological: Negative.       Physical Exam: BP 123/75    Pulse 70    Temp (!) 97.4 F (36.3 C) (Oral)    Resp 18    Ht 5\' 10"  (1.778 m)    Wt 85.9 kg    SpO2 98%    BMI 27.18 kg/m  Physical Exam Constitutional:      Appearance: Normal appearance. He is normal weight.  HENT:     Head: Normocephalic and atraumatic.  Eyes:     Extraocular Movements: Extraocular movements intact.     Conjunctiva/sclera: Conjunctivae normal.  Cardiovascular:     Rate and Rhythm: Normal rate and regular rhythm.     Pulses: Normal pulses.  Pulmonary:     Effort: Pulmonary effort is normal. No respiratory distress.  Abdominal:     General: Abdomen is flat. There is no distension.  Musculoskeletal:        General: No swelling. Normal range of motion.     Cervical back: Normal range of motion.  Skin:    General: Skin is warm and dry.  Neurological:     General: No focal deficit present.     Mental Status: He is alert and oriented to person, place, and time.  Psychiatric:         Mood and Affect: Mood normal.       Diagnostic Studies & Laboratory data:    Left Heart Catherization: Three-vessel coronary disease.  Tandem lesions in the LAD, but the distal vessel is adequate for bypass.  There is a large ramus that has a tight stenosis proximally.  The circumflex has a tight stenosis and the distal vessels fill late.  There is a tight distal RCA lesion there is faint filling of the PDA and PLV.  Echo: LV function is reduced to 20 to 25%.  RV function is also moderately reduced.  There is mild to moderate MR, but the valve appears structurally normal.  No significant aortic valve disease.  There is concern for PFO with atrial shunting.  His LV end-diastolic diameter is 6.3 cm.  His left atrial diameter is also dilated to 5.2 cm   I have independently reviewed the above radiologic studies and discussed with the patient   Recent Lab Findings: Lab Results  Component Value Date   WBC 5.9 12/14/2019   HGB 11.5 (L) 12/14/2019   HCT 37.3 (L) 12/14/2019   PLT 222 12/14/2019   GLUCOSE 97 12/14/2019   CHOL 219 (H) 12/12/2019   TRIG 117 12/12/2019   HDL 38 (L) 12/12/2019   LDLCALC 158 (H) 12/12/2019   ALT 19 12/11/2019   AST 28 12/11/2019   NA 139 12/14/2019   K 3.7 12/14/2019   CL 106 12/14/2019   CREATININE 1.85 (H) 12/14/2019   BUN 29 (H) 12/14/2019   CO2 24  12/14/2019   TSH 4.855 (H) 12/11/2019   INR 1.0 12/12/2019   HGBA1C 5.9 (H) 12/11/2019      Assessment / Plan:   64 year old gentleman that was admitted with three-vessel coronary disease, congestive heart failure with an EF of 20%, RV dysfunction, chronic kidney injury with a creatinine of 1.85, and mild to moderate mitral valve regurgitation.  His LV is severely dilated with a diameter of 6.3 as well.  This patient may benefit from heart failure evaluation for advanced therapies in addition to undergoing coronary artery bypass grafting.  He does have good targets along the LAD, ramus and circumflex  distributions.  It is unclear as to whether or not he has good targets on his posterior wall.  If we were to proceed with surgery there is a good chance that he would require mechanical support given his RV dysfunction.  His case will be discussed with Dr. Orvan Seen, as well as the heart failure team.     I  spent 40 minutes counseling the patient face to face.   Lajuana Matte 12/14/2019 11:45 AM

## 2019-12-14 NOTE — Progress Notes (Signed)
ANTICOAGULATION CONSULT NOTE  Pharmacy Consult for heparin Indication: chest pain/ACS  No Known Allergies  Patient Measurements: Height: 5\' 10"  (177.8 cm) Weight: 85.9 kg (189 lb 6.4 oz) IBW/kg (Calculated) : 73  Vital Signs: Temp: 97.7 F (36.5 C) (07/11 1220) Temp Source: Oral (07/11 1227) BP: 124/83 (07/11 1220) Pulse Rate: 86 (07/11 1220)  Labs: Recent Labs    12/11/19 2140 12/11/19 2140 12/11/19 2318 12/12/19 0559 12/12/19 0559 12/13/19 0544 12/13/19 0544 12/13/19 1450 12/14/19 0354 12/14/19 1423  HGB 13.0   < >  --  12.7*   < > 11.9*  --   --  11.5*  --   HCT 40.8   < >  --  40.5  --  37.5*  --   --  37.3*  --   PLT 302   < >  --  288  --  262  --   --  222  --   LABPROT  --   --   --  13.2  --   --   --   --   --   --   INR  --   --   --  1.0  --   --   --   --   --   --   HEPARINUNFRC  --    < > 0.20* 0.41   < > 0.23*   < > 0.64 0.70 0.83*  CREATININE 1.91*  --   --  2.04*  --   --   --   --  1.85*  --   TROPONINIHS 669*  --  606*  --   --   --   --   --   --   --    < > = values in this interval not displayed.    Estimated Creatinine Clearance: 42.7 mL/min (A) (by C-G formula based on SCr of 1.85 mg/dL (H)).  Assessment: 64 yo m transferred from OSH on heparin 1350 units/hr for NSTEMI.  No AC PTA.  Heparin level is up at 0.83 despite decreasing rate to 1650 units/hr.  CBC is stable. No bleeding or unusual bruising per patient.  SCr is elevated but improving for admission Awaiting CABG consult.  Goal of Therapy:  Heparin level 0.3-0.7 units/ml Monitor platelets by anticoagulation protocol: Yes   Plan:  Decrease heparin rate further to 1450 units/hr.  Recheck heparin level in 6 hours, monitor daily CBC. F/u plans for CABG.  Sloan Leiter, PharmD, BCPS, BCCCP Clinical Pharmacist Please refer to [facility] for [place] numbers 12/14/2019 3:15 PM  Please check [email or web address] for unit-specific pharmacy phone numbers.

## 2019-12-14 NOTE — Progress Notes (Signed)
ANTICOAGULATION CONSULT NOTE  Pharmacy Consult for heparin Indication: chest pain/ACS  No Known Allergies  Patient Measurements: Height: 5\' 10"  (177.8 cm) Weight: 85.9 kg (189 lb 6.4 oz) IBW/kg (Calculated) : 73  Vital Signs: Temp: 98 F (36.7 C) (07/11 1948) Temp Source: Oral (07/11 1948) BP: 114/77 (07/11 1948) Pulse Rate: 84 (07/11 1559)  Labs: Recent Labs    12/11/19 2318 12/11/19 2318 12/12/19 0559 12/12/19 0559 12/13/19 0544 12/13/19 1450 12/14/19 0354 12/14/19 1423 12/14/19 2149  HGB  --   --  12.7*   < > 11.9*  --  11.5*  --   --   HCT  --   --  40.5  --  37.5*  --  37.3*  --   --   PLT  --   --  288  --  262  --  222  --   --   LABPROT  --   --  13.2  --   --   --   --   --   --   INR  --   --  1.0  --   --   --   --   --   --   HEPARINUNFRC 0.20*   < > 0.41   < > 0.23*   < > 0.70 0.83* 0.64  CREATININE  --   --  2.04*  --   --   --  1.85*  --   --   TROPONINIHS 606*  --   --   --   --   --   --   --   --    < > = values in this interval not displayed.    Estimated Creatinine Clearance: 42.7 mL/min (A) (by C-G formula based on SCr of 1.85 mg/dL (H)).  Assessment: 64 yo m transferred from OSH on heparin 1350 units/hr for NSTEMI.  No AC PTA.  Heparin level is now within goal at 0.64 after decreasing rate to 1450 units/hr.  CBC is stable. No bleeding or unusual bruising per patient.  Awaiting CABG consult.  Goal of Therapy:  Heparin level 0.3-0.7 units/ml Monitor platelets by anticoagulation protocol: Yes   Plan:  Continue heparin at 1450 units/hr.  Recheck heparin level in 6 hours, monitor daily CBC. F/u plans for CABG.  Erin Hearing PharmD., BCPS Clinical Pharmacist 12/14/2019 10:25 PM

## 2019-12-14 NOTE — Progress Notes (Signed)
Progress Note  Patient Name: Joseph Greer Date of Encounter: 12/14/2019  [facility] Cardiologist: Marlou Porch ? F/u in [place]   Subjective   No chest pain  Wondering when CVTS will see him !! They were contacted by our service Friday and Saturday Called Dr lightfoot on cell phone this am no answer  Will contact again today  I told him he is not likely to have a surgical date available till Wednesday anyway   Inpatient Medications    Scheduled Meds: . Apremilast  1 tablet Oral BID  . aspirin EC  81 mg Oral Daily  . atorvastatin  80 mg Oral Daily  . metoprolol tartrate  25 mg Oral Q6H  . sodium chloride flush  3 mL Intravenous Q12H  . tacrolimus  2 mg Oral BID  . venlafaxine XR  75 mg Oral Daily   Continuous Infusions: . sodium chloride    . diltiazem (CARDIZEM) infusion Stopped (12/12/19 0027)  . heparin 1,700 Units/hr (12/14/19 0229)   PRN Meds: sodium chloride, acetaminophen, ALPRAZolam, nitroGLYCERIN, ondansetron (ZOFRAN) IV, sodium chloride flush, zolpidem   Vital Signs    Vitals:   12/14/19 0511 12/14/19 0830 12/14/19 0833 12/14/19 0838  BP: 110/83 129/83  117/82  Pulse: 75 75 80 78  Resp: 17 13 20 18   Temp: 97.7 F (36.5 C)  97.8 F (36.6 C) (!) 97.4 F (36.3 C)  TempSrc: Oral  Oral Oral  SpO2: 96% 99% 98% 98%  Weight: 85.9 kg     Height:        Intake/Output Summary (Last 24 hours) at 12/14/2019 0859 Last data filed at 12/14/2019 0700 Gross per 24 hour  Intake 686.37 ml  Output --  Net 686.37 ml   Last 3 Weights 12/14/2019 12/12/2019 12/11/2019  Weight (lbs) 189 lb 6.4 oz 186 lb 3.2 oz 185 lb 6.4 oz  Weight (kg) 85.911 kg 84.46 kg 84.097 kg      Telemetry    Sinus rhythm/PAT noted on telemetry- Personally Reviewed  ECG    Sinus rhythm with deep T wave inversions, possibly out of proportion to LVH repolarization abnormalities- Personally Reviewed  Physical Exam   Affect appropriate Healthy:  appears stated age HEENT: normal Neck  supple with no adenopathy JVP normal no bruits no thyromegaly Lungs clear with no wheezing and good diaphragmatic motion Heart:  S1/S2 no murmur, no rub, gallop or click PMI normal Abdomen: benighn, BS positve, no tenderness, no AAA no bruit.  No HSM or HJR post liver transplant  Distal pulses intact with no bruits No edema Neuro non-focal Skin warm and dry No muscular weakness Right radial cath site A   Labs    High Sensitivity Troponin:   Recent Labs  Lab 12/11/19 2140 12/11/19 2318  TROPONINIHS 669* 606*      Chemistry Recent Labs  Lab 12/11/19 2140 12/12/19 0559 12/14/19 0354  NA 139 137 139  K 3.6 3.6 3.7  CL 105 102 106  CO2 22 25 24   GLUCOSE 155* 101* 97  BUN 27* 29* 29*  CREATININE 1.91* 2.04* 1.85*  CALCIUM 8.6* 8.5* 8.7*  PROT 6.9  --   --   ALBUMIN 3.4*  --   --   AST 28  --   --   ALT 19  --   --   ALKPHOS 77  --   --   BILITOT 1.1  --   --   GFRNONAA 37* 34* 38*  GFRAA 43* 39* 44*  ANIONGAP  12 10 9      Hematology Recent Labs  Lab 12/12/19 0559 12/13/19 0544 12/14/19 0354  WBC 5.8 6.8 5.9  RBC 4.87 4.47 4.47  HGB 12.7* 11.9* 11.5*  HCT 40.5 37.5* 37.3*  MCV 83.2 83.9 83.4  MCH 26.1 26.6 25.7*  MCHC 31.4 31.7 30.8  RDW 14.9 14.8 14.7  PLT 288 262 222      Radiology    CARDIAC CATHETERIZATION  Addendum Date: 12/12/2019    Dist RCA lesion is 100% stenosed. Faint left to right and right to right collaterals.  Ramus lesion is 95% stenosed.  1st Diag lesion is 75% stenosed.  Prox Cx lesion is 99% stenosed. THis is the culprit lesion. TIMI 2 flow.  Ost LAD to Prox LAD lesion is 60% stenosed. Complex lesion with shelf of plaque.  Mid LAD lesion is 80% stenosed.  LV end diastolic pressure is moderately elevated.  There is no aortic valve stenosis.  Plan for cardiac surgery consult.   Result Date: 12/12/2019  Dist RCA lesion is 100% stenosed. Faint left to right and right to right collaterals.  Ramus lesion is 95% stenosed.  1st  Diag lesion is 75% stenosed.  Prox Cx lesion is 99% stenosed. THis is the culprit lesion. TIMI 2 flow.  Ost LAD to Prox LAD lesion is 60% stenosed. Complex lesion with shelf of plaque.  Mid LAD lesion is 80% stenosed.  LV end diastolic pressure is moderately elevated.  There is no aortic valve stenosis.  Plan for cardiac surgery consult.   ECHOCARDIOGRAM COMPLETE  Result Date: 12/12/2019    ECHOCARDIOGRAM REPORT   Patient Name:   LASHAN GLUTH [facility] Date of Exam: 12/12/2019 Medical Rec #:  [id number]            Height:       70.0 in Accession #:    [id number]           Weight:       186.2 lb Date of Birth:  [birth year]            BSA:          2.025 m Patient Age:    16 years             BP:           114/80 mmHg Patient Gender: M                    HR:           83 bpm. Exam Location:  Inpatient Procedure: 2D Echo and Intracardiac Opacification Agent Indications:    NSTEMI I21.4  History:        Patient has prior history of Echocardiogram examinations, most                 recent 03/27/2013. Risk Factors:Hypertension and Diabetes.  Sonographer:    Mikki Santee RDCS (AE) Referring Phys: [place]  1. LVEF is depressed with severe hypokinesis in all segments except septum. . Left ventricular ejection fraction, by estimation, is 20 to 25%. The left ventricle has severely decreased function. The left ventricular internal cavity size was severely dilated. Left ventricular diastolic parameters are indeterminate.  2. Right ventricular systolic function is moderately reduced. The right ventricular size is normal.  3. Left atrial size was mild to moderately dilated.  4. Turbulent flow along intraatrial septum suspicious for PFO . Evidence of atrial level shunting detected by color flow Doppler.  5. The  mitral valve is abnormal. Mild to moderate mitral valve regurgitation.  6. The aortic valve is normal in structure. Aortic valve regurgitation is trivial. FINDINGS  Left Ventricle: LVEF is  depressed with severe hypokinesis in all segments except septum. Left ventricular ejection fraction, by estimation, is 20 to 25%. The left ventricle has severely decreased function. The left ventricle demonstrates regional wall motion abnormalities. Definity contrast agent was given IV to delineate the left ventricular endocardial borders. The left ventricular internal cavity size was severely dilated. There is no left ventricular hypertrophy. Left ventricular diastolic parameters are indeterminate. Right Ventricle: The right ventricular size is normal. No increase in right ventricular wall thickness. Right ventricular systolic function is moderately reduced. Left Atrium: Left atrial size was mild to moderately dilated. Right Atrium: Right atrial size was normal in size. Pericardium: There is no evidence of pericardial effusion. Mitral Valve: The mitral valve is abnormal. There is mild thickening of the mitral valve leaflet(s). Mild to moderate mitral annular calcification. Mild to moderate mitral valve regurgitation. Tricuspid Valve: The tricuspid valve is normal in structure. Tricuspid valve regurgitation is trivial. Aortic Valve: The aortic valve is normal in structure. Aortic valve regurgitation is trivial. Pulmonic Valve: The pulmonic valve was normal in structure. Pulmonic valve regurgitation is not visualized. Aorta: The aortic root was not well visualized. IAS/Shunts: Evidence of atrial level shunting detected by color flow Doppler.  LEFT VENTRICLE PLAX 2D LVIDd:         6.30 cm LVIDs:         5.70 cm LV PW:         1.00 cm LV IVS:        1.00 cm LVOT diam:     2.50 cm LV SV:         62 LV SV Index:   31 LVOT Area:     4.91 cm  LV Volumes (MOD) LV vol d, MOD A2C: 131.0 ml LV vol d, MOD A4C: 211.0 ml LV vol s, MOD A2C: 90.3 ml LV vol s, MOD A4C: 162.0 ml LV SV MOD A2C:     40.7 ml LV SV MOD A4C:     211.0 ml LV SV MOD BP:      49.2 ml RIGHT VENTRICLE RV S prime:     9.90 cm/s TAPSE (M-mode): 1.1 cm LEFT  ATRIUM              Index       RIGHT ATRIUM           Index LA diam:        5.20 cm  2.57 cm/m  RA Area:     14.60 cm LA Vol (A2C):   110.0 ml 54.32 ml/m RA Volume:   32.50 ml  16.05 ml/m LA Vol (A4C):   83.7 ml  41.34 ml/m LA Biplane Vol: 96.7 ml  47.76 ml/m  AORTIC VALVE LVOT Vmax:   59.60 cm/s LVOT Vmean:  44.400 cm/s LVOT VTI:    0.126 m  AORTA Ao Root diam: 3.60 cm MITRAL VALVE MV Area (PHT): 6.32 cm    SHUNTS MV Decel Time: 120 msec    Systemic VTI:  0.13 m MR Peak grad: 52.4 mmHg    Systemic Diam: 2.50 cm MR Mean grad: 36.0 mmHg MR Vmax:      362.00 cm/s MR Vmean:     287.0 cm/s MV E velocity: 86.80 cm/s Dorris Carnes MD Electronically signed by Dorris Carnes MD Signature Date/Time: 12/12/2019/11:46:49 AM  Final     Cardiac Studies   Troponin was 2 at outside hospital old assay, trending downward.  Echo previously in 2014 showed normal EF with mild aortic regurgitation.  EchocardiogramEF 25-30% mild MR   Patient Profile     64 y.o. male transferred from [facility] after having shortness of breath fatigue elevated troponin trending downward. EF 30%--new. New acute systolic HF.   Assessment & Plan    Non-ST elevation myocardial infarction/paroxysmal atrial fibrillation - cath with surgical disease waiting for CVTS consult will order pre CABG dopplers   Acute systolic heart failure-EF 30% inferolateral akinesis -continue ACE/diuretics   Liver transplant -Currently on tacrolimus.  No changes.  Prior LDL 72.  Chronic kidney disease stage IIIa -Creatinine 2.04 will check in am   For questions or updates, please contact [place] Please consult [email or web address] for contact info under        Signed, Jenkins Rouge, MD  12/14/2019, 8:59 AM

## 2019-12-15 ENCOUNTER — Inpatient Hospital Stay: Payer: Self-pay

## 2019-12-15 ENCOUNTER — Other Ambulatory Visit: Payer: Self-pay | Admitting: *Deleted

## 2019-12-15 ENCOUNTER — Inpatient Hospital Stay (HOSPITAL_COMMUNITY): Payer: Medicare HMO

## 2019-12-15 ENCOUNTER — Encounter (HOSPITAL_COMMUNITY): Payer: Self-pay | Admitting: Interventional Cardiology

## 2019-12-15 DIAGNOSIS — I5021 Acute systolic (congestive) heart failure: Secondary | ICD-10-CM

## 2019-12-15 DIAGNOSIS — I2510 Atherosclerotic heart disease of native coronary artery without angina pectoris: Secondary | ICD-10-CM

## 2019-12-15 DIAGNOSIS — I5023 Acute on chronic systolic (congestive) heart failure: Secondary | ICD-10-CM

## 2019-12-15 LAB — CBC
HCT: 33.8 % — ABNORMAL LOW (ref 39.0–52.0)
Hemoglobin: 10.4 g/dL — ABNORMAL LOW (ref 13.0–17.0)
MCH: 26 pg (ref 26.0–34.0)
MCHC: 30.8 g/dL (ref 30.0–36.0)
MCV: 84.5 fL (ref 80.0–100.0)
Platelets: 184 10*3/uL (ref 150–400)
RBC: 4 MIL/uL — ABNORMAL LOW (ref 4.22–5.81)
RDW: 14.7 % (ref 11.5–15.5)
WBC: 5.4 10*3/uL (ref 4.0–10.5)
nRBC: 0 % (ref 0.0–0.2)

## 2019-12-15 LAB — BASIC METABOLIC PANEL
Anion gap: 11 (ref 5–15)
BUN: 30 mg/dL — ABNORMAL HIGH (ref 8–23)
CO2: 21 mmol/L — ABNORMAL LOW (ref 22–32)
Calcium: 8.3 mg/dL — ABNORMAL LOW (ref 8.9–10.3)
Chloride: 108 mmol/L (ref 98–111)
Creatinine, Ser: 2.06 mg/dL — ABNORMAL HIGH (ref 0.61–1.24)
GFR calc Af Amer: 39 mL/min — ABNORMAL LOW (ref >60)
GFR calc non Af Amer: 34 mL/min — ABNORMAL LOW (ref >60)
Glucose, Bld: 97 mg/dL (ref 70–99)
Potassium: 3.9 mmol/L (ref 3.5–5.1)
Sodium: 140 mmol/L (ref 135–145)

## 2019-12-15 LAB — COOXEMETRY PANEL
Carboxyhemoglobin: 1.3 % (ref 0.5–1.5)
Methemoglobin: 1 % (ref 0.0–1.5)
O2 Saturation: 61.9 %
Total hemoglobin: 12.5 g/dL (ref 12.0–16.0)

## 2019-12-15 LAB — MAGNESIUM: Magnesium: 1.7 mg/dL (ref 1.7–2.4)

## 2019-12-15 LAB — HEPARIN LEVEL (UNFRACTIONATED): Heparin Unfractionated: 0.57 IU/mL (ref 0.30–0.70)

## 2019-12-15 MED ORDER — SODIUM CHLORIDE 0.9% FLUSH
10.0000 mL | Freq: Two times a day (BID) | INTRAVENOUS | Status: DC
  Administered 2019-12-15 – 2019-12-16 (×2): 10 mL
  Administered 2019-12-17: 20 mL
  Administered 2019-12-18 (×2): 10 mL

## 2019-12-15 MED ORDER — GADOBUTROL 1 MMOL/ML IV SOLN
8.0000 mL | Freq: Once | INTRAVENOUS | Status: AC | PRN
  Administered 2019-12-15: 8 mL via INTRAVENOUS

## 2019-12-15 MED ORDER — SODIUM CHLORIDE 0.9% FLUSH: 10.0000 mL | INTRAVENOUS | Status: DC | PRN

## 2019-12-15 MED ORDER — MAGNESIUM SULFATE IN D5W 1-5 GM/100ML-% IV SOLN
1.0000 g | Freq: Once | INTRAVENOUS | Status: AC
  Administered 2019-12-15: 1 g via INTRAVENOUS
  Filled 2019-12-15: qty 100

## 2019-12-15 MED ORDER — FUROSEMIDE 10 MG/ML IJ SOLN: 80.0000 mg | Freq: Once | INTRAMUSCULAR | Status: AC

## 2019-12-15 MED ORDER — AMIODARONE HCL 200 MG PO TABS
200.0000 mg | ORAL_TABLET | Freq: Two times a day (BID) | ORAL | Status: DC
  Administered 2019-12-15 – 2019-12-16 (×4): 200 mg via ORAL
  Filled 2019-12-15 (×4): qty 1

## 2019-12-15 MED ORDER — FUROSEMIDE 10 MG/ML IJ SOLN
INTRAMUSCULAR | Status: AC
  Administered 2019-12-15: 80 mg via INTRAVENOUS
  Filled 2019-12-15: qty 8

## 2019-12-15 MED ORDER — CHLORHEXIDINE GLUCONATE CLOTH 2 % EX PADS
6.0000 | MEDICATED_PAD | Freq: Every day | CUTANEOUS | Status: DC
  Administered 2019-12-16 – 2019-12-18 (×3): 6 via TOPICAL

## 2019-12-15 NOTE — Progress Notes (Addendum)
Progress Note  Patient Name: Joseph Greer Date of Encounter: 12/15/2019  [facility] Cardiologist: Marlou Porch ? F/u in [place]   Subjective   No angina , dyspnea Anxious to find out about plans for surgery  CVTS has requested CHF Team consult   Inpatient Medications    Scheduled Meds: . amiodarone  200 mg Oral BID  . Apremilast  1 tablet Oral BID  . aspirin EC  81 mg Oral Daily  . atorvastatin  80 mg Oral Daily  . metoprolol tartrate  25 mg Oral Q6H  . sodium chloride flush  3 mL Intravenous Q12H  . tacrolimus  2 mg Oral BID  . venlafaxine XR  75 mg Oral Daily   Continuous Infusions: . sodium chloride    . heparin 1,450 Units/hr (12/14/19 1713)  . magnesium sulfate bolus IVPB     PRN Meds: sodium chloride, acetaminophen, ALPRAZolam, nitroGLYCERIN, ondansetron (ZOFRAN) IV, sodium chloride flush, zolpidem   Vital Signs    Vitals:   12/15/19 0116 12/15/19 0418 12/15/19 0521 12/15/19 0844  BP: 109/76 118/73  112/81  Pulse: 69  82 78  Resp: 15   19  Temp:  98.2 F (36.8 C)    TempSrc:  Oral    SpO2: 97%   99%  Weight:  86.3 kg    Height:        Intake/Output Summary (Last 24 hours) at 12/15/2019 6761 Last data filed at 12/15/2019 0300 Gross per 24 hour  Intake 472.65 ml  Output 400 ml  Net 72.65 ml   Last 3 Weights 12/15/2019 12/14/2019 12/12/2019  Weight (lbs) 190 lb 4.8 oz 189 lb 6.4 oz 186 lb 3.2 oz  Weight (kg) 86.32 kg 85.911 kg 84.46 kg      Telemetry    Sinus rhythm/PAT noted on telemetry- Personally Reviewed  ECG    Sinus rhythm with deep T wave inversions, possibly out of proportion to LVH repolarization abnormalities- Personally Reviewed  Physical Exam   Affect appropriate Healthy:  appears stated age HEENT: normal Neck supple with no adenopathy JVP normal no bruits no thyromegaly Lungs clear with no wheezing and good diaphragmatic motion Heart:  S1/S2 no murmur, no rub, gallop or click PMI normal Abdomen: benighn, BS positve, no  tenderness, no AAA no bruit.  No HSM or HJR post liver transplant  Distal pulses intact with no bruits No edema Neuro non-focal Skin warm and dry No muscular weakness Right radial cath site A   Labs    High Sensitivity Troponin:   Recent Labs  Lab 12/11/19 2140 12/11/19 2318  TROPONINIHS 669* 606*      Chemistry Recent Labs  Lab 12/11/19 2140 12/11/19 2140 12/12/19 0559 12/14/19 0354 12/15/19 0335  NA 139   < > 137 139 140  K 3.6   < > 3.6 3.7 3.9  CL 105   < > 102 106 108  CO2 22   < > 25 24 21*  GLUCOSE 155*   < > 101* 97 97  BUN 27*   < > 29* 29* 30*  CREATININE 1.91*   < > 2.04* 1.85* 2.06*  CALCIUM 8.6*   < > 8.5* 8.7* 8.3*  PROT 6.9  --   --   --   --   ALBUMIN 3.4*  --   --   --   --   AST 28  --   --   --   --   ALT 19  --   --   --   --  ALKPHOS 77  --   --   --   --   BILITOT 1.1  --   --   --   --   GFRNONAA 37*   < > 34* 38* 34*  GFRAA 43*   < > 39* 44* 39*  ANIONGAP 12   < > 10 9 11    < > = values in this interval not displayed.     Hematology Recent Labs  Lab 12/13/19 0544 12/14/19 0354 12/15/19 0335  WBC 6.8 5.9 5.4  RBC 4.47 4.47 4.00*  HGB 11.9* 11.5* 10.4*  HCT 37.5* 37.3* 33.8*  MCV 83.9 83.4 84.5  MCH 26.6 25.7* 26.0  MCHC 31.7 30.8 30.8  RDW 14.8 14.7 14.7  PLT 262 222 184      Radiology    No results found.  Cardiac Studies   Troponin was 2 at outside hospital old assay, trending downward.  Echo previously in 2014 showed normal EF with mild aortic regurgitation.  EchocardiogramEF 25-30% mild MR   Patient Profile     64 y.o. male transferred from [facility] after having shortness of breath fatigue elevated troponin trending downward. EF 30%--new. New acute systolic HF.   Assessment & Plan    Non-ST elevation myocardial infarction/paroxysmal atrial fibrillation - cath with surgical disease Pre CABG dopplers ok. Seen by Dr Kipp Brood over weekend concern For decreased LV/RV EF asking for CHF Team consult Pre  CABG dopplers ordered. 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  No changes.  Prior LDL 72.Discussed with pharmacy Amiodarone can increase level of tacrolimus but not contraindicated will write for level to be down in 3 days It is a send out so results may take 48 hours   Chronic kidney disease stage IIIa -Creatinine 2.06    PAC:s :   Has runs of atrial ectopy at risk for PAF LA size 52 mm on echo will start oral load for amiodarone  Prior to surgery    PFO:  Seen on echo ? Closure at time of surgery   For questions or updates, please contact [place] Please consult [email or web address] for contact info under        Signed, Jenkins Rouge, MD  12/15/2019, 9:28 AM

## 2019-12-15 NOTE — Progress Notes (Signed)
ANTICOAGULATION CONSULT NOTE  Pharmacy Consult for heparin Indication: chest pain/ACS  No Known Allergies  Patient Measurements: Height: 5\' 10"  (177.8 cm) Weight: 86.3 kg (190 lb 4.8 oz) IBW/kg (Calculated) : 73  Vital Signs: Temp: 98.2 F (36.8 C) (07/12 0418) Temp Source: Oral (07/12 0418) BP: 118/73 (07/12 0418) Pulse Rate: 82 (07/12 0521)  Labs: Recent Labs    12/13/19 0544 12/13/19 1450 12/14/19 0354 12/14/19 0354 12/14/19 1423 12/14/19 2149 12/15/19 0335  HGB 11.9*  --  11.5*  --   --   --  10.4*  HCT 37.5*  --  37.3*  --   --   --  33.8*  PLT 262  --  222  --   --   --  184  HEPARINUNFRC 0.23*   < > 0.70   < > 0.83* 0.64 0.57  CREATININE  --   --  1.85*  --   --   --  2.06*   < > = values in this interval not displayed.    Estimated Creatinine Clearance: 38.4 mL/min (A) (by C-G formula based on SCr of 2.06 mg/dL (H)).  Assessment: 64 yo m transferred from OSH on heparin 1350 units/hr for NSTEMI.  No AC PTA.  Heparin level now therapeutic, CBC stable. CABG workup ongoing.  Goal of Therapy:  Heparin level 0.3-0.7 units/ml Monitor platelets by anticoagulation protocol: Yes   Plan:  Continue heparin 1450 units/h Daily heparin level and CBC   Arrie Senate, PharmD, BCPS Clinical Pharmacist [phone] Please check AMION for all [facility] Pharmacy numbers 12/15/2019

## 2019-12-15 NOTE — Progress Notes (Signed)
Pt had PICC placed.  First COOX and CVP obtained. CVP 16.  Paged heart failure via office.  Awaiting further orders.  Will continue to monitor patient closely.

## 2019-12-15 NOTE — Consult Note (Signed)
Paged as Joseph Greer more dyspnic at rest and not able to lay flat. PICC line was able to be placed few hours ago. On exam sitting upright, comfortable but unable to lay flat without orthopnea. JVP up, mild bibasilar crackles, lower extremities warm and well perfused. Reviewed rounding team notes/plans. I do not think he is currently in low output shock. Appears to be volume overloaded, CVP off PICC c/w this at (16). Lasix 80 mg IV x1 now. UOP minimal over night shift. If no improvement over next few hours then will rebolus at lasix 120 mg IV + drip. No indication for inotrope currently unless worsening clinically despite diuresis.

## 2019-12-15 NOTE — Progress Notes (Signed)
Peripherally Inserted Central Catheter Placement  The IV Nurse has discussed with the patient and/or persons authorized to consent for the patient, the purpose of this procedure and the potential benefits and risks involved with this procedure.  The benefits include less needle sticks, lab draws from the catheter, and the patient may be discharged home with the catheter. Risks include, but not limited to, infection, bleeding, blood clot (thrombus formation), and puncture of an artery; nerve damage and irregular heartbeat and possibility to perform a PICC exchange if needed/ordered by physician.  Alternatives to this procedure were also discussed.  Bard Power PICC patient education guide, fact sheet on infection prevention and patient information card has been provided to patient /or left at bedside.    PICC Placement Documentation  PICC Double Lumen 12/15/19 PICC Right Brachial 42 cm 0 cm (Active)  Indication for Insertion or Continuance of Line Vasoactive infusions 12/15/19 1918  Exposed Catheter (cm) 0 cm 12/15/19 1918  Site Assessment Clean;Dry;Intact 12/15/19 1918  Lumen #1 Status Flushed;Saline locked;Blood return noted 12/15/19 1918  Lumen #2 Status Saline locked;Flushed;Blood return noted 12/15/19 1918  Dressing Type Transparent;Securing device 12/15/19 1918  Dressing Status Clean;Dry;Intact;Antimicrobial disc in place 12/15/19 1918  Dressing Change Due 12/22/19 12/15/19 1918       Frances Maywood 12/15/2019, 7:21 PM

## 2019-12-15 NOTE — Progress Notes (Signed)
Notified by CCMD, pt had 25 bts VT.  Pt awakened from sleep and denied c/o.  Pt currently SR with PAC - 60-70s.  BP 109/76. K 3.7 7/11 and Mg 1.5 7/8.  Dr. Juliane Lack notified via [place].  Will cont to monitor pt closely.

## 2019-12-15 NOTE — Consult Note (Addendum)
Advanced Heart Failure Team Consult Note   Primary Physician: Monico Blitz, MD PCP-Cardiologist:  Candee Furbish, MD  AHF: New (Dr. [name])   Reason for Consultation: Heart failure optimization prior to anticipated CABG   HPI:    Joseph Greer is seen today for heart failure optimization prior to anticipated CABG at the request of Dr. Johnsie Cancel, Cardiology.   64 y/o male w/ h/o Hep C s/p liver transplant in 2009 (followed at [facility]), Stage III CKD, HTN and T2DM but no prior cardiac history, who initially presented to [facility] on 7/6 w/ complaints of 10-14 day h/o new SOB, fatigue, decreased exercise tolerance, intermittent chest tightness, orthopnea and PND. Found to be in afib w/ RVR and elevated hs troponin 669>>606. Started on IV heparin and diltiazem and had spontaneous conversion back to NSR.  He was transferred to [facility] for further care.    2D Echo demonstrated biventricular dysfunction w/ severely dilated LV w/ severely reduced systolic function, LVEF 61-95% w/ severe hypokinesis in all segments except the septum. RV systolic function moderately reduced. Turbulent flow along intraatrial septum suspicious for PFO. Evidence  of atrial level shunting detected by color flow Doppler. Mild-moderate MR. No significant aortic valve disease.   Diagnostic LHC on 7/9 demonstrated severe multivessel disease, as outlined below.    Dist RCA lesion is 100% stenosed. Faint left to right and right to right collaterals.  Ramus lesion is 95% stenosed.  1st Diag lesion is 75% stenosed.  Prox Cx lesion is 99% stenosed. THis is the culprit lesion. TIMI 2 flow.  Ost LAD to Prox LAD lesion is 60% stenosed. Complex lesion with shelf of plaque.  Mid LAD lesion is 80% stenosed.  LV end diastolic pressure is moderately elevated.  There is no aortic valve stenosis.  CT surgery consulted and he is being evaluated for potential CABG, tentatively scheduled for 7/16. Preoperative studies pending.  cMRI also pending.   Renal indices concerning for low output. SCr today 2.06  Prior baseline labs from [facility] show baseline SCr ~1.3 (April 2021). 1.77 day of transfer to [facility].     TSH mildly elevated at 4.9. Free T3 normal. Free T4 slightly elevated at 1.19. HIV non reactive.   LDL elevated at 158 mg/dL. Hgb A1c 5.9.    Echo 12/12/19 1. LVEF is depressed with severe hypokinesis in all segments except septum. . Left ventricular ejection fraction, by estimation, is 20 to 25%. The left ventricle has severely decreased function. The left ventricular internal cavity size was severely dilated. Left ventricular diastolic parameters are indeterminate. 2. Right ventricular systolic function is moderately reduced. The right ventricular size is normal. 3. Left atrial size was mild to moderately dilated. 4. Turbulent flow along intraatrial septum suspicious for PFO . Evidence of atrial level shunting detected by color flow Doppler. 5. The mitral valve is abnormal. Mild to moderate mitral valve regurgitation. 6. The aortic valve is normal in structure. Aortic valve regurgitation is trivial.  Review of Systems: [y] = yes, [ ]  = no   . General: Weight gain [ ] ; Weight loss [ ] ; Anorexia [ ] ; Fatigue [ X]; Fever [ ] ; Chills [ ] ; Weakness [ ]   . Cardiac: Chest pain/pressure [ X]; Resting SOB [ ] ; Exertional SOB Valu.Nieves ]; Orthopnea Valu.Nieves ]; Pedal Edema [ ] ; Palpitations [ ] ; Syncope [ ] ; Presyncope [ ] ; Paroxysmal nocturnal dyspnea[X ]  . Pulmonary: Cough [ ] ; Wheezing[ ] ; Hemoptysis[ ] ; Sputum [ ] ; Snoring [ ]   .  GI: Vomiting[ ] ; Dysphagia[ ] ; Melena[ ] ; Hematochezia [ ] ; Heartburn[ ] ; Abdominal pain [ ] ; Constipation [ ] ; Diarrhea [ ] ; BRBPR [ ]   . GU: Hematuria[ ] ; Dysuria [ ] ; Nocturia[ ]   . Vascular: Pain in legs with walking [ ] ; Pain in feet with lying flat [ ] ; Non-healing sores [ ] ; Stroke [ ] ; TIA [ ] ; Slurred speech [ ] ;  . Neuro: Headaches[ ] ; Vertigo[ ] ; Seizures[ ] ; Paresthesias[ ] ;Blurred  vision [ ] ; Diplopia [ ] ; Vision changes [ ]   . Ortho/Skin: Arthritis [ ] ; Joint pain [ ] ; Muscle pain [ ] ; Joint swelling [ ] ; Back Pain [ ] ; Rash [ ]   . Psych: Depression[ ] ; Anxiety[ ]   . Heme: Bleeding problems [ ] ; Clotting disorders [ ] ; Anemia [ ]   . Endocrine: Diabetes [ ] ; Thyroid dysfunction[ ]   Home Medications Prior to Admission medications   Medication Sig Start Date End Date Taking? Authorizing Provider  amLODipine (NORVASC) 5 MG tablet Take 5 mg by mouth daily. 10/28/19  Yes [provider]  Apremilast (OTEZLA) 30 MG TABS Take 1 tablet by mouth 2 (two) times daily. 06/16/19  Yes [provider]  LORazepam (ATIVAN) 1 MG tablet Take 1 mg by mouth 2 (two) times daily as needed (panic attacks).  11/28/19  Yes [provider]  nitroGLYCERIN (NITROSTAT) 0.4 MG SL tablet Place 1 tablet (0.4 mg total) under the tongue every 5 (five) minutes x 3 doses as needed for chest pain. 03/27/13  Yes Rai, Ripudeep K, MD  tacrolimus (PROGRAF) 1 MG capsule Take 2 mg by mouth 2 (two) times daily.    Yes [provider]  venlafaxine XR (EFFEXOR-XR) 75 MG 24 hr capsule Take 75 mg by mouth daily.   Yes [provider]  buPROPion (WELLBUTRIN) 75 MG tablet Take 75 mg by mouth 2 (two) times daily.  Patient not taking: Reported on 12/11/2019    [provider]  FLUoxetine (PROZAC) 40 MG capsule Take 40 mg by mouth daily. Patient not taking: Reported on 12/11/2019    [provider]  lisinopril (PRINIVIL,ZESTRIL) 20 MG tablet Take 20 mg by mouth daily. Patient not taking: Reported on 12/11/2019    [provider]  mycophenolate (CELLCEPT) 250 MG capsule Take 250 mg by mouth 2 (two) times daily.  Patient not taking: Reported on 12/11/2019    [provider]  triamcinolone ointment (KENALOG) 0.1 % Apply 1 application topically 2 (two) times daily. Patient not taking: Reported on 12/11/2019    [provider]    Past Medical  History: Past Medical History:  Diagnosis Date  . Anxiety   . CKD (chronic kidney disease), stage III   . Depression   . Diabetes mellitus type 2, noninsulin dependent ([place])   . Hepatitis C   . Hypertension   . Psoriasis   . Status post liver transplant [facility])     Past Surgical History: Past Surgical History:  Procedure Laterality Date  . COLONOSCOPY    . COLONOSCOPY WITH PROPOFOL N/A 06/01/2017   Procedure: COLONOSCOPY WITH PROPOFOL;  Surgeon: Rogene Houston, MD;  Location: [facility];  Service: Endoscopy;  Laterality: N/A;  7:30  . HERNIA REPAIR Right   . LEFT HEART CATH AND CORONARY ANGIOGRAPHY N/A 12/12/2019   Procedure: LEFT HEART CATH AND CORONARY ANGIOGRAPHY;  Surgeon: Jettie Booze, MD;  Location: [place] CV LAB;  Service: Cardiovascular;  Laterality: N/A;  . LIVER BIOPSY    . LIVER SURGERY    .  POLYPECTOMY  06/01/2017   Procedure: POLYPECTOMY;  Surgeon: Rogene Houston, MD;  Location: [facility];  Service: Endoscopy;;  polyp at sigmoid colon x2, ascending colon polyp, hepatic flexure polyp  . UPPER GASTROINTESTINAL ENDOSCOPY      Family History: Family History  Problem Relation Age of Onset  . Diabetes Mother   . Heart disease Father   . Heart disease Son     Social History: Social History   Socioeconomic History  . Marital status: Single    Spouse name: Not on file  . Number of children: Not on file  . Years of education: Not on file  . Highest education level: Not on file  Occupational History  . Not on file  Tobacco Use  . Smoking status: Former Smoker    Packs/day: 1.00    Types: Cigarettes    Quit date: 09/04/2007    Years since quitting: 12.2  . Smokeless tobacco: Never Used  Vaping Use  . Vaping Use: Never used  Substance and Sexual Activity  . Alcohol use: No  . Drug use: No  . Sexual activity: Yes    Birth control/protection: None  Other Topics Concern  . Not on file  Social History Narrative  . Not on file   Social  Determinants of Health   Financial Resource Strain:   . Difficulty of Paying Living Expenses:   Food Insecurity:   . Worried About Charity fundraiser in the Last Year:   . Arboriculturist in the Last Year:   Transportation Needs:   . Film/video editor (Medical):   Marland Kitchen Lack of Transportation (Non-Medical):   Physical Activity:   . Days of Exercise per Week:   . Minutes of Exercise per Session:   Stress:   . Feeling of Stress :   Social Connections:   . Frequency of Communication with Friends and Family:   . Frequency of Social Gatherings with Friends and Family:   . Attends Religious Services:   . Active Member of Clubs or Organizations:   . Attends Archivist Meetings:   Marland Kitchen Marital Status:     Allergies:  No Known Allergies  Objective:    Vital Signs:   Temp:  [97.6 F (36.4 C)-98.2 F (36.8 C)] 98.2 F (36.8 C) (07/12 0418) Pulse Rate:  [69-86] 78 (07/12 0844) Resp:  [15-20] 19 (07/12 0844) BP: (109-126)/(73-84) 112/81 (07/12 0844) SpO2:  [94 %-100 %] 99 % (07/12 0844) Weight:  [86.3 kg] 86.3 kg (07/12 0418) Last BM Date: 12/14/19  Weight change: Filed Weights   12/12/19 0635 12/14/19 0511 12/15/19 0418  Weight: 84.5 kg 85.9 kg 86.3 kg    Intake/Output:   Intake/Output Summary (Last 24 hours) at 12/15/2019 1028 Last data filed at 12/15/2019 0300 Gross per 24 hour  Intake 472.65 ml  Output 400 ml  Net 72.65 ml      Physical Exam    General:  Well appearing white male, sitting up in chair. No resp difficulty HEENT: normal Neck: supple. JVP ~8 cm . Carotids 2+ bilat; no bruits. No lymphadenopathy or thyromegaly appreciated. Cor: PMI nondisplaced. Regular rate & rhythm. No rubs, gallops or murmurs. Lungs: clear Abdomen: soft, nontender, nondistended. No hepatosplenomegaly. No bruits or masses. Good bowel sounds. Extremities: no cyanosis, clubbing, rash, edema Neuro: alert & orientedx3, cranial nerves grossly intact. moves all 4 extremities  w/o difficulty. Affect pleasant   Telemetry   NSR w/ PACs   EKG  Most recent EKG 7/9 shows NSR 88 bpm w/ PACs and LVH   Labs   Basic Metabolic Panel: Recent Labs  Lab 12/11/19 2140 12/11/19 2140 12/12/19 0559 12/14/19 0354 12/15/19 0335  NA 139  --  137 139 140  K 3.6  --  3.6 3.7 3.9  CL 105  --  102 106 108  CO2 22  --  25 24 21*  GLUCOSE 155*  --  101* 97 97  BUN 27*  --  29* 29* 30*  CREATININE 1.91*  --  2.04* 1.85* 2.06*  CALCIUM 8.6*   < > 8.5* 8.7* 8.3*  MG 1.5*  --   --   --  1.7   < > = values in this interval not displayed.    Liver Function Tests: Recent Labs  Lab 12/11/19 2140  AST 28  ALT 19  ALKPHOS 77  BILITOT 1.1  PROT 6.9  ALBUMIN 3.4*   No results for input(s): LIPASE, AMYLASE in the last 168 hours. No results for input(s): AMMONIA in the last 168 hours.  CBC: Recent Labs  Lab 12/11/19 2140 12/12/19 0559 12/13/19 0544 12/14/19 0354 12/15/19 0335  WBC 6.6 5.8 6.8 5.9 5.4  NEUTROABS 4.5  --   --   --   --   HGB 13.0 12.7* 11.9* 11.5* 10.4*  HCT 40.8 40.5 37.5* 37.3* 33.8*  MCV 82.1 83.2 83.9 83.4 84.5  PLT 302 288 262 222 184    Cardiac Enzymes: No results for input(s): CKTOTAL, CKMB, CKMBINDEX, TROPONINI in the last 168 hours.  BNP: BNP (last 3 results) No results for input(s): BNP in the last 8760 hours.  ProBNP (last 3 results) No results for input(s): PROBNP in the last 8760 hours.   CBG: No results for input(s): GLUCAP in the last 168 hours.  Coagulation Studies: No results for input(s): LABPROT, INR in the last 72 hours.   Imaging    No results found.   Medications:     Current Medications: . amiodarone  200 mg Oral BID  . Apremilast  1 tablet Oral BID  . aspirin EC  81 mg Oral Daily  . atorvastatin  80 mg Oral Daily  . metoprolol tartrate  25 mg Oral Q6H  . sodium chloride flush  3 mL Intravenous Q12H  . tacrolimus  2 mg Oral BID  . venlafaxine XR  75 mg Oral Daily     Infusions: . sodium  chloride    . heparin 1,450 Units/hr (12/14/19 1713)  . magnesium sulfate bolus IVPB       Assessment/Plan   1. NSTEMI/ CAD - Hs troponin  669>>606 - LHC w/ severe 3VD:   Dist RCA lesion is 100% stenosed. Faint left to right and right to right collaterals.   Ramus lesion is 95% stenosed. 1st Diag lesion is 75% stenosed.   Prox Cx lesion is 99% stenosed (culprit lesion. TIMI 2 flow).  Ost LAD to Prox LAD lesion is 60% stenosed.   Mid LAD lesion is 80% stenosed. - awaiting CABG, tentatively scheduled 7/16. Currently CP free.  - continue IV heparin - continue ASA + high intensity statin  - stop  blocker given concerns for low output HF   2. Biventricular Heart Failure - 2D Echo demonstrates biventricular dysfunction w/ severely dilated LV w/ severely reduced systolic function, LVEF 32-67% w/ severe hypokinesis in all segments except the septum. RV systolic function moderately reduced. - prior 2D echo in 2014 demonstrated normal LVEF and RV but  apical views showed apical thickening as well as asymetric thickening of the  septum suggestive of a variant of a hypertrophic cardiomyopathy (no LVH noted on echo this admit) - cMRI pending - HIV nonreactive  - suspect predominate ICM +/- possible component of tachy mediated CM. [facility] w/ severe multivessel disease. Found to be in rapid afib of unknown duration on admit.   - awaiting potential CABG  - concern for low output HF / cardiorenal syndrome w/ rising SCr. PICC to assess co-ox/CVP.  ? inotropic support w/ milrinone to better optimize prior to CABG.  - stop  blocker given concerns for low output - no ARB/ARNi, dig nor spiro w/ CKD/ SCr >2 - BP too soft for Bidil currently  - post operatively, would consider SGLT2i given concomitant  T2DM     3. PAF - noted to be in afib w/ RVR on admit, now in NSR w/ PACs - TSH mildly elevated at 4.9. Free T3 normal. Free T4 slightly elevated at 1.19.  - keep K and Mg stable  - continue PO  amiodarone 200 mg BID - continue IV heparin - Maze/LA appendage clipping with CABG.   4. H/o Hep C s/p Liver Transplant - s/p liver transplant in 2009, followed at [facility]  - currently on tacrolimus - plan to follow tacrolimus level closely while on amiodarone (Amiodarone can increase level of tacrolimus but not contraindicated). Repeat level 7/16   5. Stage III CKD - Baseline SCr ~1.3 (April 2021) - 2.36 on admit to [facility]>>2.10>>1.77 - trend here 1.91>>2.04>>1.85>>2.06 - May be cardiorenal but also had contrast load.  - PICC to follow CVP/co-ox, may need milrinone.  - follow BMP closely   6. HTN - controlled currently, SBPs 110s-120s  7. HLD (LDL goal < 70 mg/dL) - LDL elevated at 158 mg/dL. HFTs normal.  - now on atorvastain 80 mg qhs (new) - needs repeat FLP and HFTs in 6-8 weeks   8. Type 2DM  - controlled. Hgb A1c 5.9   9. ? PFO  - There is concern for PFO. TTE w/ urbulent flow along intraatrial septum suspicious for PFO w/ evidence of atrial level shunting detected by color flow Doppler. - Assess on intraop TEE   Length of Stay: 3  Brittainy Simmons, PA-C  12/15/2019, 10:28 AM  Advanced Heart Failure Team Pager [phone] (M-F; 7a - 4p)  Please contact [facility] for night-coverage after hours (4p -7a ) and weekends on [email or web address]  Patient seen with PA, agree with the above note.   Patient with HCV s/p liver transplant, CKD stage 3 baseline, HTN, and DM with admitted with chest pain, dyspnea, and atrial fibrillation with RVR.  HS-TnI elevated to 600s range without trend.  Echo with EF 20-25%.  Atrial fibrillation converted spontaneously to NSR, he remains in NSR on amiodarone.  Cath was done, showing severe 3VD.  Cardiac MRI was done today, showing LCx territory likely non-viable, remaining LV myocardium appears viable.  Creatinine 2.06 today.  HF team consulted for optimization prior to possible CABG.   General: NAD Neck: No JVD, no thyromegaly or thyroid  nodule.  Lungs: Clear to auscultation bilaterally with normal respiratory effort. CV: Nondisplaced PMI.  Heart regular S1/S2, no S3/S4, 1/6 SEM RUSB.  No peripheral edema.   Abdomen: Soft, nontender, no hepatosplenomegaly, no distention.  Skin: Intact without lesions or rashes.  Neurologic: Alert and oriented x 3.  Psych: Normal affect. Extremities: No clubbing or cyanosis.  HEENT: Normal.   1. CAD: Severe  3VD on cath 7/21 => 60-70% ostial LAD, 80% mLAD, 95% ramus, 99% proximal LCx with TIMI-2 flow down the rest of the LCx, occluded distal RCA.  Suspect out of hospital inferolateral MI.  Cardiac MRI showed that the inferolateral wall is likely not viable, but the remainder of the LV myocardium is likely viable.  - Continue ASA 81 daily and atorvastatin 80 mg daily.  - Discussed with Dr. Orvan Seen, will aim for high-risk CABG later this week.  Think patient would benefit the most from this approach.  Would recommend placement of Impella 5.5 at time of surgery.  2. Acute on chronic systolic CHF: Echo with EF 20-25%, moderately decreased RV systolic function. Cardiac MRI with LV EF 26%, RV EF 38%, near full thickness scar in inferolateral wall suggesting lack of viability in the LCx territory. On exam, he is not volume overloaded.  No dyspnea or chest pain currently.  Though creatinine is up, he does not have the appearance or symptoms to suggest low output HF.  - I will arrange for PICC placement to follow CVP and co-ox, would like to confirm volume and output status.  - Stop metoprolol tartrate and will start low dose Toprol XL 25 mg daily.  - With elevated creatinine, hold off for now on Entresto, digoxin, spironolactone.  May be able to add spironolactone and digoxin soon if creatinine remains stable.  - As above, would recommend Impella 5.5 with CABG.  3. Atrial fibrillation: Patient admitted with afib/RVR, now in NSR.  - Continue amiodarone 200 mg bid.  - Would recommend Maze and LA appendage  clip with CABG.  - Continue heparin gtt.  4. Possible PFO: Intraop TEE to make definitive diagnosis, would close at time of surgery if present.  5. AKI on CKD stage 3: Creatine has been stable 1.9-2 range in hospital.  Was 1.3 back in 4/21.  As above, he does not look like he is in cardiogenic shock.  Possible rise due to contrast load at time of admission.  6. Type 2 diabetes: SSI. SGLT2 inhibitor as outpatient.  7. Liver transplant for HCV: Continue tacrolimus, will need to follow levels.   Loralie Champagne 12/15/2019 3:52 PM

## 2019-12-15 NOTE — Progress Notes (Signed)
PICC placement order received. Floor RN aware that PICC RN in [facility] and PICC cannot be done tonight. Will follow up.

## 2019-12-15 NOTE — Progress Notes (Signed)
PICC [facility] in [facility]. Rosita Fire, [place]  made aware that PICC may not be placed until tomorrow.

## 2019-12-15 NOTE — Progress Notes (Signed)
CARDIAC REHAB PHASE I   PRE:  Rate/Rhythm: 78 SR  BP:  Supine: 126/78  Sitting:   Standing:    SaO2: 94%RA  MODE:  Ambulation: 470 ft   POST:  Rate/Rhythm: 94 SR  BP:  Supine:   Sitting: 121/84  Standing:    SaO2: 100%RA 0924-0945 Pt walked 470 ft on RA pushing IV pole. No c/o SOB or CP. Stated legs did not feel as tight. To recliner to eat his breakfast. Call bell in reach. Wrote down how to view pre op video as pt interested in seeing.    Graylon Good, RN BSN  12/15/2019 9:50 AM

## 2019-12-16 ENCOUNTER — Inpatient Hospital Stay (HOSPITAL_COMMUNITY): Payer: Medicare HMO

## 2019-12-16 DIAGNOSIS — Z01810 Encounter for preprocedural cardiovascular examination: Secondary | ICD-10-CM

## 2019-12-16 DIAGNOSIS — I5043 Acute on chronic combined systolic (congestive) and diastolic (congestive) heart failure: Secondary | ICD-10-CM

## 2019-12-16 LAB — BASIC METABOLIC PANEL
Anion gap: 9 (ref 5–15)
BUN: 27 mg/dL — ABNORMAL HIGH (ref 8–23)
CO2: 25 mmol/L (ref 22–32)
Calcium: 8.6 mg/dL — ABNORMAL LOW (ref 8.9–10.3)
Chloride: 106 mmol/L (ref 98–111)
Creatinine, Ser: 2.02 mg/dL — ABNORMAL HIGH (ref 0.61–1.24)
GFR calc Af Amer: 40 mL/min — ABNORMAL LOW (ref >60)
GFR calc non Af Amer: 34 mL/min — ABNORMAL LOW (ref >60)
Glucose, Bld: 120 mg/dL — ABNORMAL HIGH (ref 70–99)
Potassium: 3.5 mmol/L (ref 3.5–5.1)
Sodium: 140 mmol/L (ref 135–145)

## 2019-12-16 LAB — CBC
HCT: 36.8 % — ABNORMAL LOW (ref 39.0–52.0)
Hemoglobin: 11.4 g/dL — ABNORMAL LOW (ref 13.0–17.0)
MCH: 26.1 pg (ref 26.0–34.0)
MCHC: 31 g/dL (ref 30.0–36.0)
MCV: 84.4 fL (ref 80.0–100.0)
Platelets: 204 10*3/uL (ref 150–400)
RBC: 4.36 MIL/uL (ref 4.22–5.81)
RDW: 14.6 % (ref 11.5–15.5)
WBC: 6.8 10*3/uL (ref 4.0–10.5)
nRBC: 0 % (ref 0.0–0.2)

## 2019-12-16 LAB — HEPARIN LEVEL (UNFRACTIONATED)
Heparin Unfractionated: 0.26 IU/mL — ABNORMAL LOW (ref 0.30–0.70)
Heparin Unfractionated: 0.33 IU/mL (ref 0.30–0.70)

## 2019-12-16 LAB — MAGNESIUM: Magnesium: 1.8 mg/dL (ref 1.7–2.4)

## 2019-12-16 LAB — PULMONARY FUNCTION TEST
FEF 25-75 Pre: 1.47 L/sec
FEF2575-%Pred-Pre: 49 %
FEV1-%Pred-Pre: 49 %
FEV1-Pre: 1.81 L
FEV1FVC-%Pred-Pre: 101 %
FEV6-%Pred-Pre: 50 %
FEV6-Pre: 2.34 L
FEV6FVC-%Pred-Pre: 104 %
FVC-%Pred-Pre: 48 %
FVC-Pre: 2.36 L
Pre FEV1/FVC ratio: 77 %
Pre FEV6/FVC Ratio: 99 %

## 2019-12-16 LAB — COOXEMETRY PANEL
Carboxyhemoglobin: 1.4 % (ref 0.5–1.5)
Methemoglobin: 1.1 % (ref 0.0–1.5)
O2 Saturation: 63.9 %
Total hemoglobin: 11.8 g/dL — ABNORMAL LOW (ref 12.0–16.0)

## 2019-12-16 LAB — SURGICAL PCR SCREEN
MRSA, PCR: NEGATIVE
Staphylococcus aureus: POSITIVE — AB

## 2019-12-16 MED ORDER — SODIUM CHLORIDE 0.9 % IV SOLN: INTRAVENOUS | Status: DC

## 2019-12-16 MED ORDER — SODIUM CHLORIDE 0.9% FLUSH
3.0000 mL | Freq: Two times a day (BID) | INTRAVENOUS | Status: DC
  Administered 2019-12-16: 3 mL via INTRAVENOUS

## 2019-12-16 MED ORDER — MUPIROCIN CALCIUM 2 % EX CREA
TOPICAL_CREAM | Freq: Two times a day (BID) | CUTANEOUS | Status: DC
  Filled 2019-12-16: qty 15

## 2019-12-16 MED ORDER — ASPIRIN 81 MG PO CHEW
81.0000 mg | CHEWABLE_TABLET | ORAL | Status: AC
  Administered 2019-12-17: 81 mg via ORAL
  Filled 2019-12-16: qty 1

## 2019-12-16 MED ORDER — POTASSIUM CHLORIDE CRYS ER 20 MEQ PO TBCR
30.0000 meq | EXTENDED_RELEASE_TABLET | Freq: Once | ORAL | Status: AC
  Administered 2019-12-16: 30 meq via ORAL
  Filled 2019-12-16: qty 1

## 2019-12-16 MED ORDER — FUROSEMIDE 10 MG/ML IJ SOLN
120.0000 mg | Freq: Once | INTRAVENOUS | Status: AC
  Administered 2019-12-16: 120 mg via INTRAVENOUS
  Filled 2019-12-16: qty 10

## 2019-12-16 MED ORDER — MAGNESIUM SULFATE 2 GM/50ML IV SOLN
2.0000 g | Freq: Once | INTRAVENOUS | Status: AC
  Administered 2019-12-16: 2 g via INTRAVENOUS
  Filled 2019-12-16: qty 50

## 2019-12-16 MED ORDER — SODIUM CHLORIDE 0.9 % IV SOLN: 250.0000 mL | INTRAVENOUS | Status: DC | PRN

## 2019-12-16 MED ORDER — SODIUM CHLORIDE 0.9% FLUSH: 3.0000 mL | INTRAVENOUS | Status: DC | PRN

## 2019-12-16 MED ORDER — METOPROLOL SUCCINATE ER 25 MG PO TB24
25.0000 mg | ORAL_TABLET | Freq: Every day | ORAL | Status: DC
  Administered 2019-12-16 – 2019-12-18 (×3): 25 mg via ORAL
  Filled 2019-12-16 (×3): qty 1

## 2019-12-16 NOTE — Progress Notes (Signed)
ANTICOAGULATION CONSULT NOTE - Follow Up Consult  Pharmacy Consult for heparin Indication: CAD  Labs: Recent Labs    12/14/19 0354 12/14/19 0354 12/14/19 1423 12/14/19 2149 12/15/19 0335 12/16/19 0444  HGB 11.5*   < >  --   --  10.4* 11.4*  HCT 37.3*  --   --   --  33.8* 36.8*  PLT 222  --   --   --  184 204  HEPARINUNFRC 0.70  --    < > 0.64 0.57 0.26*  CREATININE 1.85*  --   --   --  2.06* 2.02*   < > = values in this interval not displayed.    Assessment: 64yo male subtherapeutic on heparin after two levels at goal; RN reports some occlusions on the IV pump overnight but no other gtt issues or signs of bleeding.  Goal of Therapy:  Heparin level 0.3-0.7 units/ml   Plan:  Will increase heparin gtt slightly to 1500 units/hr and check level in 8 hours.    Wynona Neat, PharmD, BCPS  12/16/2019,5:38 AM

## 2019-12-16 NOTE — Progress Notes (Signed)
4 Days Post-Op Procedure(s) (LRB): LEFT HEART CATH AND CORONARY ANGIOGRAPHY (N/A) Subjective: Asked to help prepare this patient for surgery by Dr. Orvan Seen who is planning multivessel CABG with possible combined Maze procedure on Friday 7-16.  Patient has been evaluated by Dr. [name] with whom I have discussed the patient for coordination of care.  Patient presented after non-STEMI in atrial fibrillation with symptoms of CHF and low EF by echo.  Coronary angiograms personally reviewed showing severe three-vessel coronary disease. Patient has mild-moderate MR.  Cardiac MRI viability shows scarring in the circumflex distribution but viable myocardium in the anterior and inferior wall per Dr. [name].  Patient has a very high functional status prior to admission.  His past history is significant for a liver transplant at [facility] for history of hepatitis C.  He has done well with the transplant on Prograf 2 mg twice daily and no history of rejection for years.  He returns to [facility] once a year and has not required active medical intervention. The patient has been stable on heparin, his pre-CABG Dopplers and PFTs are pending.  He is currently in sinus rhythm. Objective: Vital signs in last 24 hours: Temp:  [97.7 F (36.5 C)-98.1 F (36.7 C)] 97.9 F (36.6 C) (07/13 0747) Pulse Rate:  [71-77] 75 (07/13 0747) Cardiac Rhythm: Normal sinus rhythm (07/13 0802) Resp:  [18-22] 18 (07/13 0747) BP: (115-129)/(69-86) 115/69 (07/13 0747) SpO2:  [94 %-97 %] 94 % (07/13 0747) Weight:  [86.5 kg] 86.5 kg (07/13 0231)  Hemodynamic parameters for last 24 hours: CVP:  [6 mmHg-20 mmHg] 6 mmHg  Intake/Output from previous day: 07/12 0701 - 07/13 0700 In: 1212.3 [P.O.:804; I.V.:258.3; IV Piggyback:150] Out: 1800 [Urine:1800] Intake/Output this shift: No intake/output data recorded.  Exam Alert and comfortable sitting up in chair able to walk in the room easily Lungs clear Rate regular without murmur No  abdominal tenderness or distention Neuro intact Extremities warm without significant edema  Lab Results: Recent Labs    12/15/19 0335 12/16/19 0444  WBC 5.4 6.8  HGB 10.4* 11.4*  HCT 33.8* 36.8*  PLT 184 204   BMET:  Recent Labs    12/15/19 0335 12/16/19 0444  NA 140 140  K 3.9 3.5  CL 108 106  CO2 21* 25  GLUCOSE 97 120*  BUN 30* 27*  CREATININE 2.06* 2.02*  CALCIUM 8.3* 8.6*    PT/INR: No results for input(s): LABPROT, INR in the last 72 hours. ABG    Component Value Date/Time   O2SAT 63.9 12/16/2019 0425   CBG (last 3)  No results for input(s): GLUCAP in the last 72 hours.  Assessment/Plan: S/P Procedure(s) (LRB): LEFT HEART CATH AND CORONARY ANGIOGRAPHY (N/A) The patient has done well following the pretransplant and does not see a hepatologist regularly.  I have placed a call to the patient's liver transplant coordinator, Vevelyn Pat, RN [phone] to ask for guidance regarding providing optimal perioperative care for the liver transplant.  Currently LFTs are all normal.  White count is mildly reduced at 5000.   LOS: 4 days    Tharon Aquas Trigt III 12/16/2019

## 2019-12-16 NOTE — H&P (View-Only) (Signed)
Advanced Heart Failure Rounding Note  PCP-Cardiologist: Candee Furbish, MD  AHF:  Dr. [name]   Patient Profile   64 y/o male w/ h/o Hep C s/p liver transplant in 2009 (followed at [facility]), Stage III CKD, HTN and T2DM admitted w/ NSTEMI and Afib w/ RVR, found to have severe 3VD and biventricular heart failure and ? PFO, LVEF 20-25%, RV moderately reduced. Awaiting CABG.    Subjective:    Co-ox 64%. No inotrope requirements currently.   Currently CP free. Developed orthopnea and PND overnight w/ high CVP reading of 16. Given bolus of IV Lasix, 80 mg, followed by lasix gtt. -1.8L out in UOP. Symptoms improved, CVP 6 today on my read. Lasix gtt now off.   SCr stable past 24 hrs at 2.02.   Currently NSR w/ PACs. 5 beats of NSVT on tele. Mg 1.7. K 3.5    Objective:   Weight Range: 86.5 kg Body mass index is 27.36 kg/m.   Vital Signs:   Temp:  [97.7 F (36.5 C)-98.1 F (36.7 C)] 97.9 F (36.6 C) (07/13 0747) Pulse Rate:  [71-78] 75 (07/13 0747) Resp:  [18-22] 18 (07/13 0747) BP: (112-129)/(69-86) 115/69 (07/13 0747) SpO2:  [94 %-99 %] 94 % (07/13 0747) Weight:  [86.5 kg] 86.5 kg (07/13 0231) Last BM Date: 12/15/19  Weight change: Filed Weights   12/14/19 0511 12/15/19 0418 12/16/19 0231  Weight: 85.9 kg 86.3 kg 86.5 kg    Intake/Output:   Intake/Output Summary (Last 24 hours) at 12/16/2019 0823 Last data filed at 12/16/2019 0545 Gross per 24 hour  Intake 1212.3 ml  Output 1800 ml  Net -587.7 ml      Physical Exam    CVP 6  General:  Well appearing, sitting up in chair. No resp difficulty HEENT: Normal Neck: Supple. JVP ~6 cm . Carotids 2+ bilat; no bruits. No lymphadenopathy or thyromegaly appreciated. Cor: PMI nondisplaced. Regular rate & rhythm. No rubs, gallops or murmurs. Lungs: Clear, no wheezing  Abdomen: Soft, nontender, nondistended. No hepatosplenomegaly. No bruits or masses. Good bowel sounds. Extremities: No cyanosis, clubbing, rash, edema + RUE PICC   Neuro: Alert & orientedx3, cranial nerves grossly intact. moves all 4 extremities w/o difficulty. Affect pleasant   Telemetry   NSR w/ PACs HR 80s   EKG    No new EKG to review today.   Labs    CBC Recent Labs    12/15/19 0335 12/16/19 0444  WBC 5.4 6.8  HGB 10.4* 11.4*  HCT 33.8* 36.8*  MCV 84.5 84.4  PLT 184 858   Basic Metabolic Panel Recent Labs    12/15/19 0335 12/16/19 0444  NA 140 140  K 3.9 3.5  CL 108 106  CO2 21* 25  GLUCOSE 97 120*  BUN 30* 27*  CREATININE 2.06* 2.02*  CALCIUM 8.3* 8.6*  MG 1.7 1.8   Liver Function Tests No results for input(s): AST, ALT, ALKPHOS, BILITOT, PROT, ALBUMIN in the last 72 hours. No results for input(s): LIPASE, AMYLASE in the last 72 hours. Cardiac Enzymes No results for input(s): CKTOTAL, CKMB, CKMBINDEX, TROPONINI in the last 72 hours.  BNP: BNP (last 3 results) No results for input(s): BNP in the last 8760 hours.  ProBNP (last 3 results) No results for input(s): PROBNP in the last 8760 hours.   D-Dimer No results for input(s): DDIMER in the last 72 hours. Hemoglobin A1C No results for input(s): HGBA1C in the last 72 hours. Fasting Lipid Panel No results for input(s):  CHOL, HDL, LDLCALC, TRIG, CHOLHDL, LDLDIRECT in the last 72 hours. Thyroid Function Tests No results for input(s): TSH, T4TOTAL, T3FREE, THYROIDAB in the last 72 hours.  Invalid input(s): FREET3  Other results:   Imaging    MR CARDIAC MORPHOLOGY W WO CONTRAST  Result Date: 12/15/2019 CLINICAL DATA:  Ischemic Cardiomyopathy Viability EXAM: CARDIAC MRI TECHNIQUE: The patient was scanned on a 1.5 Tesla GE magnet. A dedicated cardiac coil was used. Functional imaging was done using Fiesta sequences. 2,3, and 4 chamber views were done to assess for RWMA's. Modified Simpson's rule using a short axis stack was used to calculate an ejection fraction on a dedicated work Conservation officer, nature. The patient received 8 cc of Gadavist . After  10 minutes inversion recovery sequences were used to assess for infiltration and scar tissue. CONTRAST:  Gadavist FINDINGS: Moderate LAE Normal RA. Mild RVE with mildly decreased function. Possible PFO. Trivial pericardial effusion Normal AV. Trivial MR. Normal TV with mild TR Severe LVE with diffuse hypokinesis. The posterior lateral wall is thinned and severely hypokinetic as is the inferior apex. The circumflex distribution posterior wall Appears thinned and less viable with 2/3 rd thickness gadolinium uptake There is subendocardial uptake in the inferior wall but the inferior apex is thinned and severely hypokinetic Quantitative LVEF 26% (EDV 279, ESV 205 SV 74 cc) Quantitative RVEF 38% (EDV 113 cc ESV 98 cc SV 61 cc) IMPRESSION: 1. Severe LVE with EF 26%. The circumflex distribution appears less viable with near full thickness scar and thinning of the posterior lateral wall. The Inferior apex in the PLB/PDA territory is also thinned and severely hypokinetic with 1/3 thickness gadolinium uptake 2.  Mild RVE with mild hypokinesis RVEF 38% 3.  Moderate LAE 4.  Trivial pericardial effusion 5.  Possible PFO [place] Electronically Signed   By: Jenkins Rouge M.D.   On: 12/15/2019 14:07   [place] EKG SITE RITE  Result Date: 12/15/2019 If Site Rite image not attached, placement could not be confirmed due to current cardiac rhythm.     Medications:     Scheduled Medications: . amiodarone  200 mg Oral BID  . Apremilast  1 tablet Oral BID  . aspirin EC  81 mg Oral Daily  . atorvastatin  80 mg Oral Daily  . Chlorhexidine Gluconate Cloth  6 each Topical Daily  . metoprolol tartrate  25 mg Oral Q6H  . sodium chloride flush  10-40 mL Intracatheter Q12H  . sodium chloride flush  3 mL Intravenous Q12H  . tacrolimus  2 mg Oral BID  . venlafaxine XR  75 mg Oral Daily     Infusions: . sodium chloride    . heparin 1,500 Units/hr (12/16/19 0545)     PRN Medications:  sodium chloride, acetaminophen,  ALPRAZolam, nitroGLYCERIN, ondansetron (ZOFRAN) IV, sodium chloride flush, sodium chloride flush, zolpidem    Assessment/Plan   1. CAD: Severe 3VD on cath 7/21 => 60-70% ostial LAD, 80% mLAD, 95% ramus, 99% proximal LCx with TIMI-2 flow down the rest of the LCx, occluded distal RCA.  Suspect out of hospital inferolateral MI.  Cardiac MRI showed that the inferolateral wall is likely not viable, but the remainder of the LV myocardium is likely viable.  - Continue ASA 81 daily and atorvastatin 80 mg daily.  - Continue metoprolol succinate 25 mg daily  - Discussed with Dr. Orvan Seen, will aim for high-risk CABG later this week.  Think patient would benefit the most from this approach.  Would recommend  placement of Impella 5.5 at time of surgery.  2. Acute on chronic systolic CHF: Echo with EF 20-25%, moderately decreased RV systolic function. Cardiac MRI with LV EF 26%, RV EF 38%, near full thickness scar in inferolateral wall suggesting lack of viability in the LCx territory.  - Co-ox ok at 64%. No inotropic support needed at this time.  - required IV Lasix overnight for orthopnea/PND. Initial CVP 16. Volume status and symptoms improved today. CVP 6.  - With elevated creatinine, hold off for now on Entresto, digoxin, spironolactone.  May be able to add spironolactone and digoxin soon if creatinine remains stable.  - As above, would recommend Impella 5.5 with CABG.  3. Atrial fibrillation: Patient admitted with afib/RVR, now in NSR.  - Continue amiodarone 200 mg bid.  - Would recommend Maze and LA appendage clip with CABG.  - Continue heparin gtt.  4. Possible PFO: Intraop TEE to make definitive diagnosis, would close at time of surgery if present.  5. AKI on CKD stage 3: Creatine has been stable 1.9-2 range in hospital.  Was 1.3 back in 4/21. SCr stable today at 2.0.  As above, he does not look like he is in cardiogenic shock. Co-ox 64%.  Possible rise due to contrast load at time of admission.  -  continue to follow BMP.  6. Type 2 diabetes: SSI. SGLT2 inhibitor as outpatient.  7. Liver transplant for HCV: Continue tacrolimus, will need to follow levels.   Length of Stay: [address] PA-C  12/16/2019, 8:23 AM  Advanced Heart Failure Team Pager [phone] (M-F; 7a - 4p)  Please contact [facility] for night-coverage after hours (4p -7a ) and weekends on [email or web address]  Patient seen with PA, agree with the above note.   Last night, CVP measured up and he was short off breath, he received IV Lasix.  This morning, he feels much better, no dyspnea.  Co-ox today is 64% with CVP 8. BP stable. Creatinine stable at 2.   General: NAD Neck: No JVD, no thyromegaly or thyroid nodule.  Lungs: Clear to auscultation bilaterally with normal respiratory effort. CV: Nondisplaced PMI.  Heart regular S1/S2, no S3/S4, no murmur.  No peripheral edema.   Abdomen: Soft, nontender, no hepatosplenomegaly, no distention.  Skin: Intact without lesions or rashes.  Neurologic: Alert and oriented x 3.  Psych: Normal affect. Extremities: No clubbing or cyanosis.  HEENT: Normal.   Good co-ox and CVP this morning.  Will get formal RHC prior to CABG.   Can stop IV Lasix for now, start torsemide 20 mg po daily tomorrow morning.    He does not require an inotrope at this point.   Continue Toprol XL 25 mg daily.  If creatinine trends down a bit, will start digoxin +/- spironolactone.   He remains in NSR on amiodarone + heparin gtt.   Plan for Friday CABG likely with Impella 5.5 support, Maze/LAA clipping, possible PFO closure.   H/o liver transplant 13 years ago, not on steroids or Cellcept.  Will continue tacrolimus, to get trough level on 7/15 (after a couple days of amiodarone).  Will need to discuss any changes to peri-op management with his transplant team at [facility].   Loralie Champagne 12/16/2019 9:46 AM

## 2019-12-16 NOTE — Progress Notes (Signed)
CARDIAC REHAB PHASE I   PRE:  Rate/Rhythm: 75 SR  BP:  Supine:   Sitting: 119/74  Standing:    SaO2: 98%RA  MODE:  Ambulation: 400 ft   POST:  Rate/Rhythm: 95 SR  BP:  Supine:   Sitting: 127/72  Standing:    SaO2: 95%RA 1013-1055  Pt walked 400 ft on RA with steady gait. Pt stated he felt a little more SOB today than yesterday but no CP. To recliner after walk. Sats Greer on RA. Gave pt OHS booklet and discussed staying in the tube. Reviewed sternal precautions. Discussed importance of IS and mobility after surgery. Gave IS and pt demonstrated 1250 ml correctly. Wrote down how to view pre op video. Pt stated his son will be available after discharge to assist in care.   Joseph Good, RN BSN  12/16/2019 10:52 AM

## 2019-12-16 NOTE — Progress Notes (Signed)
Pt cont to c/o dyspnea, bibasilar rales, O2 increased to 4L 94-96%.  Pt unable to lie flat and very short of breath on exertion to stand on scale for weight.  UO 475 since 80 IV lasix.  Cardiology notified with new orders for 120 IV lasix x 1 now.

## 2019-12-16 NOTE — Progress Notes (Addendum)
Advanced Heart Failure Rounding Note  PCP-Cardiologist: Candee Furbish, MD  AHF:  Dr. [name]   Patient Profile   64 y/o male w/ h/o Hep C s/p liver transplant in 2009 (followed at [facility]), Stage III CKD, HTN and T2DM admitted w/ NSTEMI and Afib w/ RVR, found to have severe 3VD and biventricular heart failure and ? PFO, LVEF 20-25%, RV moderately reduced. Awaiting CABG.    Subjective:    Co-ox 64%. No inotrope requirements currently.   Currently CP free. Developed orthopnea and PND overnight w/ high CVP reading of 16. Given bolus of IV Lasix, 80 mg, followed by lasix gtt. -1.8L out in UOP. Symptoms improved, CVP 6 today on my read. Lasix gtt now off.   SCr stable past 24 hrs at 2.02.   Currently NSR w/ PACs. 5 beats of NSVT on tele. Mg 1.7. K 3.5    Objective:   Weight Range: 86.5 kg Body mass index is 27.36 kg/m.   Vital Signs:   Temp:  [97.7 F (36.5 C)-98.1 F (36.7 C)] 97.9 F (36.6 C) (07/13 0747) Pulse Rate:  [71-78] 75 (07/13 0747) Resp:  [18-22] 18 (07/13 0747) BP: (112-129)/(69-86) 115/69 (07/13 0747) SpO2:  [94 %-99 %] 94 % (07/13 0747) Weight:  [86.5 kg] 86.5 kg (07/13 0231) Last BM Date: 12/15/19  Weight change: Filed Weights   12/14/19 0511 12/15/19 0418 12/16/19 0231  Weight: 85.9 kg 86.3 kg 86.5 kg    Intake/Output:   Intake/Output Summary (Last 24 hours) at 12/16/2019 0823 Last data filed at 12/16/2019 0545 Gross per 24 hour  Intake 1212.3 ml  Output 1800 ml  Net -587.7 ml      Physical Exam    CVP 6  General:  Well appearing, sitting up in chair. No resp difficulty HEENT: Normal Neck: Supple. JVP ~6 cm . Carotids 2+ bilat; no bruits. No lymphadenopathy or thyromegaly appreciated. Cor: PMI nondisplaced. Regular rate & rhythm. No rubs, gallops or murmurs. Lungs: Clear, no wheezing  Abdomen: Soft, nontender, nondistended. No hepatosplenomegaly. No bruits or masses. Good bowel sounds. Extremities: No cyanosis, clubbing, rash, edema + RUE PICC   Neuro: Alert & orientedx3, cranial nerves grossly intact. moves all 4 extremities w/o difficulty. Affect pleasant   Telemetry   NSR w/ PACs HR 80s   EKG    No new EKG to review today.   Labs    CBC Recent Labs    12/15/19 0335 12/16/19 0444  WBC 5.4 6.8  HGB 10.4* 11.4*  HCT 33.8* 36.8*  MCV 84.5 84.4  PLT 184 580   Basic Metabolic Panel Recent Labs    12/15/19 0335 12/16/19 0444  NA 140 140  K 3.9 3.5  CL 108 106  CO2 21* 25  GLUCOSE 97 120*  BUN 30* 27*  CREATININE 2.06* 2.02*  CALCIUM 8.3* 8.6*  MG 1.7 1.8   Liver Function Tests No results for input(s): AST, ALT, ALKPHOS, BILITOT, PROT, ALBUMIN in the last 72 hours. No results for input(s): LIPASE, AMYLASE in the last 72 hours. Cardiac Enzymes No results for input(s): CKTOTAL, CKMB, CKMBINDEX, TROPONINI in the last 72 hours.  BNP: BNP (last 3 results) No results for input(s): BNP in the last 8760 hours.  ProBNP (last 3 results) No results for input(s): PROBNP in the last 8760 hours.   D-Dimer No results for input(s): DDIMER in the last 72 hours. Hemoglobin A1C No results for input(s): HGBA1C in the last 72 hours. Fasting Lipid Panel No results for input(s):  CHOL, HDL, LDLCALC, TRIG, CHOLHDL, LDLDIRECT in the last 72 hours. Thyroid Function Tests No results for input(s): TSH, T4TOTAL, T3FREE, THYROIDAB in the last 72 hours.  Invalid input(s): FREET3  Other results:   Imaging    MR CARDIAC MORPHOLOGY W WO CONTRAST  Result Date: 12/15/2019 CLINICAL DATA:  Ischemic Cardiomyopathy Viability EXAM: CARDIAC MRI TECHNIQUE: The patient was scanned on a 1.5 Tesla GE magnet. A dedicated cardiac coil was used. Functional imaging was done using Fiesta sequences. 2,3, and 4 chamber views were done to assess for RWMA's. Modified Simpson's rule using a short axis stack was used to calculate an ejection fraction on a dedicated work Conservation officer, nature. The patient received 8 cc of Gadavist . After  10 minutes inversion recovery sequences were used to assess for infiltration and scar tissue. CONTRAST:  Gadavist FINDINGS: Moderate LAE Normal RA. Mild RVE with mildly decreased function. Possible PFO. Trivial pericardial effusion Normal AV. Trivial MR. Normal TV with mild TR Severe LVE with diffuse hypokinesis. The posterior lateral wall is thinned and severely hypokinetic as is the inferior apex. The circumflex distribution posterior wall Appears thinned and less viable with 2/3 rd thickness gadolinium uptake There is subendocardial uptake in the inferior wall but the inferior apex is thinned and severely hypokinetic Quantitative LVEF 26% (EDV 279, ESV 205 SV 74 cc) Quantitative RVEF 38% (EDV 113 cc ESV 98 cc SV 61 cc) IMPRESSION: 1. Severe LVE with EF 26%. The circumflex distribution appears less viable with near full thickness scar and thinning of the posterior lateral wall. The Inferior apex in the PLB/PDA territory is also thinned and severely hypokinetic with 1/3 thickness gadolinium uptake 2.  Mild RVE with mild hypokinesis RVEF 38% 3.  Moderate LAE 4.  Trivial pericardial effusion 5.  Possible PFO [place] Electronically Signed   By: Jenkins Rouge M.D.   On: 12/15/2019 14:07   [place] EKG SITE RITE  Result Date: 12/15/2019 If Site Rite image not attached, placement could not be confirmed due to current cardiac rhythm.     Medications:     Scheduled Medications: . amiodarone  200 mg Oral BID  . Apremilast  1 tablet Oral BID  . aspirin EC  81 mg Oral Daily  . atorvastatin  80 mg Oral Daily  . Chlorhexidine Gluconate Cloth  6 each Topical Daily  . metoprolol tartrate  25 mg Oral Q6H  . sodium chloride flush  10-40 mL Intracatheter Q12H  . sodium chloride flush  3 mL Intravenous Q12H  . tacrolimus  2 mg Oral BID  . venlafaxine XR  75 mg Oral Daily     Infusions: . sodium chloride    . heparin 1,500 Units/hr (12/16/19 0545)     PRN Medications:  sodium chloride, acetaminophen,  ALPRAZolam, nitroGLYCERIN, ondansetron (ZOFRAN) IV, sodium chloride flush, sodium chloride flush, zolpidem    Assessment/Plan   1. CAD: Severe 3VD on cath 7/21 => 60-70% ostial LAD, 80% mLAD, 95% ramus, 99% proximal LCx with TIMI-2 flow down the rest of the LCx, occluded distal RCA.  Suspect out of hospital inferolateral MI.  Cardiac MRI showed that the inferolateral wall is likely not viable, but the remainder of the LV myocardium is likely viable.  - Continue ASA 81 daily and atorvastatin 80 mg daily.  - Continue metoprolol succinate 25 mg daily  - Discussed with Dr. Orvan Seen, will aim for high-risk CABG later this week.  Think patient would benefit the most from this approach.  Would recommend  placement of Impella 5.5 at time of surgery.  2. Acute on chronic systolic CHF: Echo with EF 20-25%, moderately decreased RV systolic function. Cardiac MRI with LV EF 26%, RV EF 38%, near full thickness scar in inferolateral wall suggesting lack of viability in the LCx territory.  - Co-ox ok at 64%. No inotropic support needed at this time.  - required IV Lasix overnight for orthopnea/PND. Initial CVP 16. Volume status and symptoms improved today. CVP 6.  - With elevated creatinine, hold off for now on Entresto, digoxin, spironolactone.  May be able to add spironolactone and digoxin soon if creatinine remains stable.  - As above, would recommend Impella 5.5 with CABG.  3. Atrial fibrillation: Patient admitted with afib/RVR, now in NSR.  - Continue amiodarone 200 mg bid.  - Would recommend Maze and LA appendage clip with CABG.  - Continue heparin gtt.  4. Possible PFO: Intraop TEE to make definitive diagnosis, would close at time of surgery if present.  5. AKI on CKD stage 3: Creatine has been stable 1.9-2 range in hospital.  Was 1.3 back in 4/21. SCr stable today at 2.0.  As above, he does not look like he is in cardiogenic shock. Co-ox 64%.  Possible rise due to contrast load at time of admission.  -  continue to follow BMP.  6. Type 2 diabetes: SSI. SGLT2 inhibitor as outpatient.  7. Liver transplant for HCV: Continue tacrolimus, will need to follow levels.   Length of Stay: [address], PA-C  12/16/2019, 8:23 AM  Advanced Heart Failure Team Pager [phone] (M-F; 7a - 4p)  Please contact [facility] for night-coverage after hours (4p -7a ) and weekends on [email or web address]  Patient seen with PA, agree with the above note.   Last night, CVP measured up and he was short off breath, he received IV Lasix.  This morning, he feels much better, no dyspnea.  Co-ox today is 64% with CVP 8. BP stable. Creatinine stable at 2.   General: NAD Neck: No JVD, no thyromegaly or thyroid nodule.  Lungs: Clear to auscultation bilaterally with normal respiratory effort. CV: Nondisplaced PMI.  Heart regular S1/S2, no S3/S4, no murmur.  No peripheral edema.   Abdomen: Soft, nontender, no hepatosplenomegaly, no distention.  Skin: Intact without lesions or rashes.  Neurologic: Alert and oriented x 3.  Psych: Normal affect. Extremities: No clubbing or cyanosis.  HEENT: Normal.   Good co-ox and CVP this morning.  Will get formal RHC prior to CABG.   Can stop IV Lasix for now, start torsemide 20 mg po daily tomorrow morning.    He does not require an inotrope at this point.   Continue Toprol XL 25 mg daily.  If creatinine trends down a bit, will start digoxin +/- spironolactone.   He remains in NSR on amiodarone + heparin gtt.   Plan for Friday CABG likely with Impella 5.5 support, Maze/LAA clipping, possible PFO closure.   H/o liver transplant 13 years ago, not on steroids or Cellcept.  Will continue tacrolimus, to get trough level on 7/15 (after a couple days of amiodarone).  Will need to discuss any changes to peri-op management with his transplant team at [facility].   Loralie Champagne 12/16/2019 9:46 AM

## 2019-12-16 NOTE — Progress Notes (Signed)
[address]       [place] [address]             [phone]      Patient had rough night.  States he had some chest pain and increased volume.  Patient is currently being treated by AHF team to medically optimize for high risk CABG procedure.  The patient states he has not yet met Dr. Orvan Seen... tentative plan for OR Friday  Ellwood Handler, PA-C

## 2019-12-16 NOTE — Progress Notes (Signed)
Pre cabg has been completed.   Preliminary results in CV Proc.   Joseph Greer 12/16/2019 11:48 AM

## 2019-12-16 NOTE — Progress Notes (Signed)
ANTICOAGULATION CONSULT NOTE  Pharmacy Consult for heparin Indication: chest pain/ACS  No Known Allergies  Patient Measurements: Height: 5\' 10"  (177.8 cm) Weight: 86.5 kg (190 lb 11.2 oz) IBW/kg (Calculated) : 73  Vital Signs: Temp: 97.8 F (36.6 C) (07/13 1321) Temp Source: Oral (07/13 1321) BP: 126/87 (07/13 1321) Pulse Rate: 75 (07/13 0747)  Labs: Recent Labs    12/14/19 0354 12/14/19 1423 12/15/19 0335 12/16/19 0444 12/16/19 1405  HGB 11.5*  --  10.4* 11.4*  --   HCT 37.3*  --  33.8* 36.8*  --   PLT 222  --  184 204  --   HEPARINUNFRC 0.70   < > 0.57 0.26* 0.33  CREATININE 1.85*  --  2.06* 2.02*  --    < > = values in this interval not displayed.    Estimated Creatinine Clearance: 39.2 mL/min (A) (by C-G formula based on SCr of 2.02 mg/dL (H)).  Assessment: 64 yo m transferred from OSH on heparin 1350 units/hr for NSTEMI.  No AC PTA.  Heparin level now therapeutic at 0.33 on 1500 units/hr. Hgb 11.4, plt 204. No s/sx of bleeding or infusion issues. CABG workup ongoing.  Goal of Therapy:  Heparin level 0.3-0.7 units/ml Monitor platelets by anticoagulation protocol: Yes   Plan:  Continue heparin 1500 units/h Daily heparin level and CBC  Antonietta Jewel, PharmD, [place] Pharmacist  Phone: [phone] 12/16/2019 2:50 PM  Please check AMION for all [place] phone numbers After 10:00 PM, call [place] [phone]

## 2019-12-17 ENCOUNTER — Inpatient Hospital Stay (HOSPITAL_COMMUNITY): Payer: Medicare HMO

## 2019-12-17 ENCOUNTER — Encounter (HOSPITAL_COMMUNITY): Payer: Self-pay | Admitting: Cardiology

## 2019-12-17 ENCOUNTER — Encounter (HOSPITAL_COMMUNITY)
Admission: EM | Disposition: A | Discharge status: Home Health | Payer: Self-pay | Source: Other Acute Inpatient Hospital | Attending: Cardiothoracic Surgery

## 2019-12-17 DIAGNOSIS — I509 Heart failure, unspecified: Secondary | ICD-10-CM

## 2019-12-17 HISTORY — PX: RIGHT HEART CATH: CATH118263

## 2019-12-17 LAB — BASIC METABOLIC PANEL
Anion gap: 11 (ref 5–15)
BUN: 27 mg/dL — ABNORMAL HIGH (ref 8–23)
CO2: 24 mmol/L (ref 22–32)
Calcium: 8.3 mg/dL — ABNORMAL LOW (ref 8.9–10.3)
Chloride: 104 mmol/L (ref 98–111)
Creatinine, Ser: 2.26 mg/dL — ABNORMAL HIGH (ref 0.61–1.24)
GFR calc Af Amer: 35 mL/min — ABNORMAL LOW (ref >60)
GFR calc non Af Amer: 30 mL/min — ABNORMAL LOW (ref >60)
Glucose, Bld: 111 mg/dL — ABNORMAL HIGH (ref 70–99)
Potassium: 3.4 mmol/L — ABNORMAL LOW (ref 3.5–5.1)
Sodium: 139 mmol/L (ref 135–145)

## 2019-12-17 LAB — CBC
HCT: 34.8 % — ABNORMAL LOW (ref 39.0–52.0)
Hemoglobin: 10.7 g/dL — ABNORMAL LOW (ref 13.0–17.0)
MCH: 25.9 pg — ABNORMAL LOW (ref 26.0–34.0)
MCHC: 30.7 g/dL (ref 30.0–36.0)
MCV: 84.3 fL (ref 80.0–100.0)
Platelets: 169 10*3/uL (ref 150–400)
RBC: 4.13 MIL/uL — ABNORMAL LOW (ref 4.22–5.81)
RDW: 14.7 % (ref 11.5–15.5)
WBC: 5.6 10*3/uL (ref 4.0–10.5)
nRBC: 0 % (ref 0.0–0.2)

## 2019-12-17 LAB — COOXEMETRY PANEL
Carboxyhemoglobin: 1.3 % (ref 0.5–1.5)
Methemoglobin: 1.1 % (ref 0.0–1.5)
O2 Saturation: 57.9 %
Total hemoglobin: 11.6 g/dL — ABNORMAL LOW (ref 12.0–16.0)

## 2019-12-17 LAB — POCT I-STAT EG7
Acid-Base Excess: 1 mmol/L (ref 0.0–2.0)
Bicarbonate: 26.2 mmol/L (ref 20.0–28.0)
Calcium, Ion: 1.14 mmol/L — ABNORMAL LOW (ref 1.15–1.40)
HCT: 33 % — ABNORMAL LOW (ref 39.0–52.0)
Hemoglobin: 11.2 g/dL — ABNORMAL LOW (ref 13.0–17.0)
O2 Saturation: 60 %
Potassium: 4 mmol/L (ref 3.5–5.1)
Sodium: 143 mmol/L (ref 135–145)
TCO2: 28 mmol/L (ref 22–32)
pCO2, Ven: 45 mmHg (ref 44.0–60.0)
pH, Ven: 7.373 (ref 7.250–7.430)
pO2, Ven: 32 mmHg (ref 32.0–45.0)

## 2019-12-17 LAB — HEPARIN LEVEL (UNFRACTIONATED): Heparin Unfractionated: 0.34 IU/mL (ref 0.30–0.70)

## 2019-12-17 SURGERY — RIGHT HEART CATH
Anesthesia: LOCAL

## 2019-12-17 MED ORDER — POTASSIUM CHLORIDE CRYS ER 20 MEQ PO TBCR
40.0000 meq | EXTENDED_RELEASE_TABLET | Freq: Once | ORAL | Status: AC
  Administered 2019-12-17: 40 meq via ORAL
  Filled 2019-12-17: qty 2

## 2019-12-17 MED ORDER — HYDRALAZINE HCL 20 MG/ML IJ SOLN: 10.0000 mg | INTRAMUSCULAR | Status: AC | PRN

## 2019-12-17 MED ORDER — FUROSEMIDE 10 MG/ML IJ SOLN
60.0000 mg | Freq: Two times a day (BID) | INTRAMUSCULAR | Status: AC
  Administered 2019-12-17 (×2): 60 mg via INTRAVENOUS
  Filled 2019-12-17 (×2): qty 6

## 2019-12-17 MED ORDER — LIDOCAINE HCL (PF) 1 % IJ SOLN
INTRAMUSCULAR | Status: DC | PRN
  Administered 2019-12-17: 2 mL

## 2019-12-17 MED ORDER — POTASSIUM CHLORIDE CRYS ER 20 MEQ PO TBCR
30.0000 meq | EXTENDED_RELEASE_TABLET | Freq: Once | ORAL | Status: AC
  Administered 2019-12-17: 30 meq via ORAL
  Filled 2019-12-17: qty 1

## 2019-12-17 MED ORDER — HEPARIN (PORCINE) IN NACL 1000-0.9 UT/500ML-% IV SOLN
INTRAVENOUS | Status: AC
  Filled 2019-12-17: qty 500

## 2019-12-17 MED ORDER — LABETALOL HCL 5 MG/ML IV SOLN: 10.0000 mg | INTRAVENOUS | Status: AC | PRN

## 2019-12-17 MED ORDER — AMIODARONE HCL 200 MG PO TABS
400.0000 mg | ORAL_TABLET | Freq: Two times a day (BID) | ORAL | Status: DC
  Administered 2019-12-17 – 2019-12-20 (×6): 400 mg via ORAL
  Filled 2019-12-17 (×6): qty 2

## 2019-12-17 MED ORDER — HEPARIN (PORCINE) 25000 UT/250ML-% IV SOLN
1500.0000 [IU]/h | INTRAVENOUS | Status: DC
  Administered 2019-12-17 – 2019-12-19 (×4): 1500 [IU]/h via INTRAVENOUS
  Filled 2019-12-17 (×3): qty 250

## 2019-12-17 MED ORDER — LIDOCAINE HCL (PF) 1 % IJ SOLN
INTRAMUSCULAR | Status: AC
  Filled 2019-12-17: qty 30

## 2019-12-17 MED ORDER — HEPARIN (PORCINE) IN NACL 1000-0.9 UT/500ML-% IV SOLN
INTRAVENOUS | Status: DC | PRN
  Administered 2019-12-17: 500 mL

## 2019-12-17 MED ORDER — MILRINONE LACTATE IN DEXTROSE 20-5 MG/100ML-% IV SOLN
0.1250 ug/kg/min | INTRAVENOUS | Status: DC
  Administered 2019-12-17 – 2019-12-18 (×2): 0.125 ug/kg/min via INTRAVENOUS
  Filled 2019-12-17 (×2): qty 100

## 2019-12-17 MED ORDER — SODIUM CHLORIDE 0.9 % IV SOLN
250.0000 mL | INTRAVENOUS | Status: DC | PRN
  Administered 2019-12-18: 250 mL via INTRAVENOUS

## 2019-12-17 MED ORDER — SODIUM CHLORIDE 0.9% FLUSH
3.0000 mL | Freq: Two times a day (BID) | INTRAVENOUS | Status: DC
  Administered 2019-12-17 – 2019-12-18 (×3): 3 mL via INTRAVENOUS

## 2019-12-17 MED ORDER — SODIUM CHLORIDE 0.9% FLUSH: 3.0000 mL | INTRAVENOUS | Status: DC | PRN

## 2019-12-17 SURGICAL SUPPLY — 6 items
CATH SWAN GANZ 7F STRAIGHT (CATHETERS) ×1 IMPLANT
GLIDESHEATH SLENDER 7FR .021G (SHEATH) ×1 IMPLANT
KIT HEART LEFT (KITS) ×2 IMPLANT
PACK CARDIAC CATHETERIZATION (CUSTOM PROCEDURE TRAY) ×2 IMPLANT
SHEATH GLIDE SLENDER 4/5FR (SHEATH) IMPLANT
TRANSDUCER W/STOPCOCK (MISCELLANEOUS) ×2 IMPLANT

## 2019-12-17 NOTE — Care Management Important Message (Signed)
Important Message  Patient Details  Name: Joseph Greer MRN: [id number] Date of Birth: 1955-12-03   Medicare Important Message Given:  Yes     Shelda Altes 12/17/2019, 1:32 PM

## 2019-12-17 NOTE — Interval H&P Note (Signed)
History and Physical Interval Note:  12/17/2019 8:55 AM  Joseph Greer  has presented today for surgery, with the diagnosis of heart failure.  The various methods of treatment have been discussed with the patient and family. After consideration of risks, benefits and other options for treatment, the patient has consented to  Procedure(s): RIGHT HEART CATH (N/A) as a surgical intervention.  The patient's history has been reviewed, patient examined, no change in status, stable for surgery.  I have reviewed the patient's chart and labs.  Questions were answered to the patient's satisfaction.     Magdalyn Arenivas [employer]

## 2019-12-17 NOTE — Progress Notes (Signed)
CARDIAC REHAB PHASE I   PRE:  Rate/Rhythm: 89 SR  BP:  Supine:   Sitting: 139/104, 145/91  Standing:    SaO2: 95%RA  MODE:  Ambulation: 400 ft   POST:  Rate/Rhythm: 98 SR  BP:  Supine:   Sitting: 147/95  Standing: 10   SaO2: 95%RA 1049-1115 Pt wanting to walk. Feels well after cath. Pt walked 400 ft on RA with steady gait. No CP. Tolerated well. Showed pt how to pull up pre op video to view later. To recliner.  Graylon Good, RN BSN  12/17/2019 11:12 AM

## 2019-12-17 NOTE — Progress Notes (Signed)
[place] for heparin Indication: chest pain/ACS   64 yo m transferred from OSH on heparin for NSTEMI. No anticoagulation PTA. CABG scheduled 7/16  No Known Allergies  Patient Measurements: Height: 5\' 10"  (177.8 cm) Weight: 85.2 kg (187 lb 14.4 oz) IBW/kg (Calculated) : 73  Vital Signs: Temp: 97.7 F (36.5 C) (07/14 0712) Temp Source: Oral (07/14 0712) BP: 150/104 (07/14 0919) Pulse Rate: 85 (07/14 0919)  Labs: Recent Labs    12/15/19 0335 12/15/19 0335 12/16/19 0444 12/16/19 1405 12/17/19 0432 12/17/19 0435  HGB 10.4*   < > 11.4*  --  10.7*  --   HCT 33.8*  --  36.8*  --  34.8*  --   PLT 184  --  204  --  169  --   HEPARINUNFRC 0.57   < > 0.26* 0.33  --  0.34  CREATININE 2.06*  --  2.02*  --  2.26*  --    < > = values in this interval not displayed.    Estimated Creatinine Clearance: 35 mL/min (A) (by C-G formula based on SCr of 2.26 mg/dL (H)).  Assessment: Heparin level now therapeutic at 0.34 on 1500 units/hr. Hgb 10.7, plt 169. No s/sx of bleeding or infusion issues. CABG workup ongoing.  Goal of Therapy:  Heparin level 0.3-0.7 units/ml Monitor platelets by anticoagulation protocol: Yes   Plan:  Continue heparin 1500 units/h Daily heparin level and CBC  Norina Buzzard, PharmD PGY1 Pharmacy Resident Phone: [phone] 12/17/2019 9:39 AM

## 2019-12-17 NOTE — Progress Notes (Addendum)
Patient ID: Joseph Greer, male   DOB: [birth year], 64 y.o.   MRN: [id number]     Advanced Heart Failure Rounding Note  PCP-Cardiologist: Candee Furbish, MD  AHF:  Dr. [name]   Patient Profile   64 y/o male w/ h/o Hep C s/p liver transplant in 2009 (followed at [facility]), Stage III CKD, HTN and T2DM admitted w/ NSTEMI and Afib w/ RVR, found to have severe 3VD and biventricular heart failure and ? PFO, LVEF 20-25%, RV moderately reduced. Awaiting CABG.    Subjective:    Patient denies dyspnea, slept well last night. Creatinine mildly higher at 2.26.   He remains in NSR.   RHC Procedural Findings: Hemodynamics (mmHg) RA mean 8 RV 55/11 PA 60/30, mean 44 PCWP mean 36 Oxygen saturations: PA 62% AO 97% Cardiac Output (Fick) 5.3  Cardiac Index (Fick) 2.61 Cardiac Output (Thermo) 5.11 Cardiac Index (Thermo) 2.52  PVR 1.6 WU  Objective:   Weight Range: 85.2 kg Body mass index is 26.96 kg/m.   Vital Signs:   Temp:  [97.7 F (36.5 C)-98.2 F (36.8 C)] 97.7 F (36.5 C) (07/14 0712) Pulse Rate:  [80-94] 85 (07/14 0919) Resp:  [11-26] 20 (07/14 0919) BP: (126-150)/(83-104) 150/104 (07/14 0919) SpO2:  [97 %-100 %] 98 % (07/14 0919) Weight:  [85.2 kg] 85.2 kg (07/14 0348) Last BM Date:  (7/12)  Weight change: Filed Weights   12/15/19 0418 12/16/19 0231 12/17/19 0348  Weight: 86.3 kg 86.5 kg 85.2 kg    Intake/Output:   Intake/Output Summary (Last 24 hours) at 12/17/2019 0930 Last data filed at 12/17/2019 0351 Gross per 24 hour  Intake --  Output 150 ml  Net -150 ml      Physical Exam    CVP 8  General: NAD Neck: JVP 8-9 cm, no thyromegaly or thyroid nodule.  Lungs: Clear to auscultation bilaterally with normal respiratory effort. CV: Nondisplaced PMI.  Heart regular S1/S2, no S3/S4, no murmur.  No peripheral edema.   Abdomen: Soft, nontender, no hepatosplenomegaly, no distention.  Skin: Intact without lesions or rashes.  Neurologic: Alert and oriented x 3.   Psych: Normal affect. Extremities: No clubbing or cyanosis.  HEENT: Normal.    Telemetry   NSR 80s  EKG    No new EKG to review today.   Labs    CBC Recent Labs    12/16/19 0444 12/17/19 0432  WBC 6.8 5.6  HGB 11.4* 10.7*  HCT 36.8* 34.8*  MCV 84.4 84.3  PLT 204 371   Basic Metabolic Panel Recent Labs    12/15/19 0335 12/15/19 0335 12/16/19 0444 12/17/19 0432  NA 140   < > 140 139  K 3.9   < > 3.5 3.4*  CL 108   < > 106 104  CO2 21*   < > 25 24  GLUCOSE 97   < > 120* 111*  BUN 30*   < > 27* 27*  CREATININE 2.06*   < > 2.02* 2.26*  CALCIUM 8.3*   < > 8.6* 8.3*  MG 1.7  --  1.8  --    < > = values in this interval not displayed.   Liver Function Tests No results for input(s): AST, ALT, ALKPHOS, BILITOT, PROT, ALBUMIN in the last 72 hours. No results for input(s): LIPASE, AMYLASE in the last 72 hours. Cardiac Enzymes No results for input(s): CKTOTAL, CKMB, CKMBINDEX, TROPONINI in the last 72 hours.  BNP: BNP (last 3 results) No results for input(s): BNP in the  last 8760 hours.  ProBNP (last 3 results) No results for input(s): PROBNP in the last 8760 hours.   D-Dimer No results for input(s): DDIMER in the last 72 hours. Hemoglobin A1C No results for input(s): HGBA1C in the last 72 hours. Fasting Lipid Panel No results for input(s): CHOL, HDL, LDLCALC, TRIG, CHOLHDL, LDLDIRECT in the last 72 hours. Thyroid Function Tests No results for input(s): TSH, T4TOTAL, T3FREE, THYROIDAB in the last 72 hours.  Invalid input(s): FREET3  Other results:   Imaging    CARDIAC CATHETERIZATION  Result Date: 12/17/2019 1. Elevated PCWP >> RA pressure. 2. Pulmonary venous hypertension. 3. Preserved cardiac output.   VAS US DOPPLER PRE CABG  Result Date: 12/16/2019 PREOPERATIVE VASCULAR EVALUATION  Indications:      Pre-CABG. Risk Factors:     Hypertension, Diabetes. Comparison Study: no prior Performing Technologist: Abram Sander RVS  Examination Guidelines:  A complete evaluation includes B-mode imaging, spectral Doppler, color Doppler, and power Doppler as needed of all accessible portions of each vessel. Bilateral testing is considered an integral part of a complete examination. Limited examinations for reoccurring indications may be performed as noted.  Right Carotid Findings: +----------+--------+--------+--------+------------+--------+           PSV cm/sEDV cm/sStenosisDescribe    Comments +----------+--------+--------+--------+------------+--------+ CCA Prox  74      13              heterogenous         +----------+--------+--------+--------+------------+--------+ CCA Distal60      20              heterogenous         +----------+--------+--------+--------+------------+--------+ ICA Prox  59      27      1-39%   heterogenous         +----------+--------+--------+--------+------------+--------+ ICA Distal72      27                                   +----------+--------+--------+--------+------------+--------+ ECA       91      9                                    +----------+--------+--------+--------+------------+--------+ Portions of this table do not appear on this page. +----------+--------+-------+--------+------------+           PSV cm/sEDV cmsDescribeArm Pressure +----------+--------+-------+--------+------------+ Subclavian114                                 +----------+--------+-------+--------+------------+ +---------+--------+--+--------+--+---------+ VertebralPSV cm/s35EDV cm/s13Antegrade +---------+--------+--+--------+--+---------+ Left Carotid Findings: +----------+--------+--------+--------+------------+--------+           PSV cm/sEDV cm/sStenosisDescribe    Comments +----------+--------+--------+--------+------------+--------+ CCA Prox  104     26              heterogenous         +----------+--------+--------+--------+------------+--------+ CCA Distal87      23               heterogenous         +----------+--------+--------+--------+------------+--------+ ICA Prox  76      27      1-39%   heterogenous         +----------+--------+--------+--------+------------+--------+ ICA Distal77      37                                   +----------+--------+--------+--------+------------+--------+  ECA       95      15                                   +----------+--------+--------+--------+------------+--------+ +----------+--------+--------+--------+------------+ SubclavianPSV cm/sEDV cm/sDescribeArm Pressure +----------+--------+--------+--------+------------+           106                                  +----------+--------+--------+--------+------------+ +---------+--------+--+--------+--+---------+ VertebralPSV cm/s42EDV cm/s13Antegrade +---------+--------+--+--------+--+---------+  ABI Findings: +--------+------------------+-----+---------+--------------------+ Right   Rt Pressure (mmHg)IndexWaveform Comment              +--------+------------------+-----+---------+--------------------+ Brachial                       triphasicrestricted extremity +--------+------------------+-----+---------+--------------------+ PTA     134               1.13 triphasic                     +--------+------------------+-----+---------+--------------------+ DP      137               1.15 triphasic                     +--------+------------------+-----+---------+--------------------+ +--------+------------------+-----+---------+-------+ Left    Lt Pressure (mmHg)IndexWaveform Comment +--------+------------------+-----+---------+-------+ [id number]                    triphasic        +--------+------------------+-----+---------+-------+ PTA     131               1.10 triphasic        +--------+------------------+-----+---------+-------+ DP      140               1.18 triphasic         +--------+------------------+-----+---------+-------+ +-------+---------------+----------------+ ABI/TBIToday's ABI/TBIPrevious ABI/TBI +-------+---------------+----------------+ Right  1.15                            +-------+---------------+----------------+ Left   1.18                            +-------+---------------+----------------+  Right Doppler Findings: +--------+--------+-----+---------+--------------------+ Site    PressureIndexDoppler  Comments             +--------+--------+-----+---------+--------------------+ Brachial             triphasicrestricted extremity +--------+--------+-----+---------+--------------------+ Radial               triphasic                     +--------+--------+-----+---------+--------------------+ Ulnar                triphasic                     +--------+--------+-----+---------+--------------------+  Left Doppler Findings: +--------+--------+-----+---------+--------+ Site    PressureIndexDoppler  Comments +--------+--------+-----+---------+--------+ [id number]          triphasic         +--------+--------+-----+---------+--------+ Radial               triphasic         +--------+--------+-----+---------+--------+ Ulnar  triphasic         +--------+--------+-----+---------+--------+  Summary: Right Carotid: Velocities in the right ICA are consistent with a 1-39% stenosis. Left Carotid: Velocities in the left ICA are consistent with a 1-39% stenosis. Vertebrals: Bilateral vertebral arteries demonstrate antegrade flow. Right ABI: Resting right ankle-brachial index is within normal range. No evidence of significant right lower extremity arterial disease. Left ABI: Resting left ankle-brachial index is within normal range. No evidence of significant left lower extremity arterial disease. Right Upper Extremity: Doppler waveforms remain within normal limits with right radial compression. Doppler waveforms  remain within normal limits with right ulnar compression. Left Upper Extremity: Doppler waveform obliterate with left radial compression. Doppler waveforms remain within normal limits with left ulnar compression.  Electronically signed by Harold Barban MD on 12/16/2019 at 4:20:38 PM.    Final      Medications:     Scheduled Medications: . [MAR Hold] amiodarone  200 mg Oral BID  . [MAR Hold] Apremilast  1 tablet Oral BID  . [MAR Hold] aspirin EC  81 mg Oral Daily  . [MAR Hold] atorvastatin  80 mg Oral Daily  . [MAR Hold] Chlorhexidine Gluconate Cloth  6 each Topical Daily  . furosemide  60 mg Intravenous BID  . [MAR Hold] metoprolol succinate  25 mg Oral Daily  . [MAR Hold] mupirocin cream   Topical BID  . [MAR Hold] sodium chloride flush  10-40 mL Intracatheter Q12H  . [MAR Hold] sodium chloride flush  3 mL Intravenous Q12H  . [MAR Hold] sodium chloride flush  3 mL Intravenous Q12H  . [MAR Hold] tacrolimus  2 mg Oral BID  . [MAR Hold] venlafaxine XR  75 mg Oral Daily    Infusions: . [MAR Hold] sodium chloride    . sodium chloride    . sodium chloride    . heparin Stopped (12/17/19 0743)  . milrinone      PRN Medications: [MAR Hold] sodium chloride, sodium chloride, [MAR Hold] acetaminophen, [MAR Hold] ALPRAZolam, [MAR Hold] nitroGLYCERIN, [MAR Hold] ondansetron (ZOFRAN) IV, [MAR Hold] sodium chloride flush, [MAR Hold] sodium chloride flush, sodium chloride flush, [MAR Hold] zolpidem    Assessment/Plan   1. CAD: Severe 3VD on cath 7/21 => 60-70% ostial LAD, 80% mLAD, 95% ramus, 99% proximal LCx with TIMI-2 flow down the rest of the LCx, occluded distal RCA.  Suspect out of hospital inferolateral MI.  Cardiac MRI showed that the inferolateral wall is likely not viable, but the remainder of the LV myocardium is likely viable.  - Continue ASA 81 daily and atorvastatin 80 mg daily.  - Continue metoprolol succinate 25 mg daily  - Discussed with Dr. Orvan Seen, will aim for high-risk  CABG Friday.  Think patient would benefit the most from this approach.  Would recommend placement of Impella 5.5 at time of surgery.  2. Acute on chronic systolic CHF: Echo with EF 20-25%, moderately decreased RV systolic function. Cardiac MRI with LV EF 26%, RV EF 38%, near full thickness scar in inferolateral wall suggesting lack of viability in the LCx territory.  RHC this morning showed significantly elevated PCWP (mean 36) with pulmonary venous hypertension, but RA pressure is only mildly elevated (RA pressure 8).  Cardiac index preserved.  Patient denies dyspnea.  - With significantly elevated PCWP, need diuresis, but will have to follow creatinine carefully => Lasix 60 mg IV bid for now.  - With elevated PA pressure (pulmonary venous hypertension) and renal dysfunction, I am going to add milrinone 0.125  mcg/kg/min while diuresing even though cardiac output is relatively preserved.   - With elevated creatinine, hold off for now on Entresto, digoxin, spironolactone.  May be able to add spironolactone and digoxin soon if creatinine stabilizes.   - As above, would recommend Impella 5.5 with CABG.  3. Atrial fibrillation: Patient admitted with afib/RVR, now in NSR.  - Continue amiodarone 400 mg bid, watch closely on milrinone, may need to transition to IV.  - Would recommend Maze and LA appendage clip with CABG.  - Continue heparin gtt.  4. Possible PFO: Intraop TEE to make definitive diagnosis, would close at time of surgery if present.  5. AKI on CKD stage 3: Creatine has been stable 1.9-2 range in hospital.  Was 1.3 back in 4/21. Creatinine mildly higher today at 2.26.  - As above, will use low dose milrinone with diuresis.   6. Type 2 diabetes: SSI. SGLT2 inhibitor as outpatient.  7. Liver transplant for HCV: Continue tacrolimus, will need to follow levels (sent).    Length of Stay: Day, MD  12/17/2019, 9:30 AM  Advanced Heart Failure Team Pager [phone] (M-F; 7a - 4p)   Please contact [facility] for night-coverage after hours (4p -7a ) and weekends on [email or web address]

## 2019-12-17 NOTE — Progress Notes (Signed)
Pt scheduled for R-heart cath this AM: Pt swabbed for COVID at [facility] on 12/09/19 2211, result: negative.

## 2019-12-18 ENCOUNTER — Encounter (HOSPITAL_COMMUNITY): Payer: Medicare HMO

## 2019-12-18 ENCOUNTER — Inpatient Hospital Stay (HOSPITAL_COMMUNITY): Payer: Medicare HMO

## 2019-12-18 LAB — COOXEMETRY PANEL
Carboxyhemoglobin: 1.5 % (ref 0.5–1.5)
Methemoglobin: 1.1 % (ref 0.0–1.5)
O2 Saturation: 56.9 %
Total hemoglobin: 11.3 g/dL — ABNORMAL LOW (ref 12.0–16.0)

## 2019-12-18 LAB — ABO/RH: ABO/RH(D): O POS

## 2019-12-18 LAB — COMPREHENSIVE METABOLIC PANEL
ALT: 32 U/L (ref 0–44)
AST: 26 U/L (ref 15–41)
Albumin: 3.1 g/dL — ABNORMAL LOW (ref 3.5–5.0)
Alkaline Phosphatase: 90 U/L (ref 38–126)
Anion gap: 12 (ref 5–15)
BUN: 25 mg/dL — ABNORMAL HIGH (ref 8–23)
CO2: 25 mmol/L (ref 22–32)
Calcium: 8.6 mg/dL — ABNORMAL LOW (ref 8.9–10.3)
Chloride: 103 mmol/L (ref 98–111)
Creatinine, Ser: 2.13 mg/dL — ABNORMAL HIGH (ref 0.61–1.24)
GFR calc Af Amer: 37 mL/min — ABNORMAL LOW (ref >60)
GFR calc non Af Amer: 32 mL/min — ABNORMAL LOW (ref >60)
Glucose, Bld: 112 mg/dL — ABNORMAL HIGH (ref 70–99)
Potassium: 3.6 mmol/L (ref 3.5–5.1)
Sodium: 140 mmol/L (ref 135–145)
Total Bilirubin: 0.8 mg/dL (ref 0.3–1.2)
Total Protein: 6.5 g/dL (ref 6.5–8.1)

## 2019-12-18 LAB — URINALYSIS, ROUTINE W REFLEX MICROSCOPIC
Bacteria, UA: NONE SEEN
Bilirubin Urine: NEGATIVE
Glucose, UA: NEGATIVE mg/dL
Hgb urine dipstick: NEGATIVE
Ketones, ur: NEGATIVE mg/dL
Leukocytes,Ua: NEGATIVE
Nitrite: NEGATIVE
Protein, ur: NEGATIVE mg/dL
Specific Gravity, Urine: 1.005 (ref 1.005–1.030)
pH: 5 (ref 5.0–8.0)

## 2019-12-18 LAB — POCT I-STAT EG7
Acid-Base Excess: 1 mmol/L (ref 0.0–2.0)
Bicarbonate: 26.2 mmol/L (ref 20.0–28.0)
Calcium, Ion: 1.18 mmol/L (ref 1.15–1.40)
HCT: 34 % — ABNORMAL LOW (ref 39.0–52.0)
Hemoglobin: 11.6 g/dL — ABNORMAL LOW (ref 13.0–17.0)
O2 Saturation: 64 %
Potassium: 4.1 mmol/L (ref 3.5–5.1)
Sodium: 142 mmol/L (ref 135–145)
TCO2: 28 mmol/L (ref 22–32)
pCO2, Ven: 44.4 mmHg (ref 44.0–60.0)
pH, Ven: 7.379 (ref 7.250–7.430)
pO2, Ven: 34 mmHg (ref 32.0–45.0)

## 2019-12-18 LAB — CBC
HCT: 34.6 % — ABNORMAL LOW (ref 39.0–52.0)
Hemoglobin: 10.7 g/dL — ABNORMAL LOW (ref 13.0–17.0)
MCH: 25.7 pg — ABNORMAL LOW (ref 26.0–34.0)
MCHC: 30.9 g/dL (ref 30.0–36.0)
MCV: 83.2 fL (ref 80.0–100.0)
Platelets: 173 10*3/uL (ref 150–400)
RBC: 4.16 MIL/uL — ABNORMAL LOW (ref 4.22–5.81)
RDW: 14.6 % (ref 11.5–15.5)
WBC: 6 10*3/uL (ref 4.0–10.5)
nRBC: 0 % (ref 0.0–0.2)

## 2019-12-18 LAB — BLOOD GAS, ARTERIAL
Acid-Base Excess: 1 mmol/L (ref 0.0–2.0)
Bicarbonate: 24.6 mmol/L (ref 20.0–28.0)
Drawn by: 40415
FIO2: 21
O2 Saturation: 94.7 %
Patient temperature: 37
pCO2 arterial: 35.3 mmHg (ref 32.0–48.0)
pH, Arterial: 7.456 — ABNORMAL HIGH (ref 7.350–7.450)
pO2, Arterial: 70.7 mmHg — ABNORMAL LOW (ref 83.0–108.0)

## 2019-12-18 LAB — APTT: aPTT: 73 seconds — ABNORMAL HIGH (ref 24–36)

## 2019-12-18 LAB — MAGNESIUM: Magnesium: 1.7 mg/dL (ref 1.7–2.4)

## 2019-12-18 LAB — HEPARIN LEVEL (UNFRACTIONATED): Heparin Unfractionated: 0.37 IU/mL (ref 0.30–0.70)

## 2019-12-18 LAB — PREPARE RBC (CROSSMATCH)

## 2019-12-18 MED ORDER — POTASSIUM CHLORIDE 2 MEQ/ML IV SOLN
80.0000 meq | INTRAVENOUS | Status: DC
  Filled 2019-12-18: qty 40

## 2019-12-18 MED ORDER — CHLORHEXIDINE GLUCONATE 0.12 % MT SOLN
15.0000 mL | Freq: Once | OROMUCOSAL | Status: AC
  Administered 2019-12-19: 15 mL via OROMUCOSAL
  Filled 2019-12-18: qty 15

## 2019-12-18 MED ORDER — MILRINONE LACTATE IN DEXTROSE 20-5 MG/100ML-% IV SOLN
0.3000 ug/kg/min | INTRAVENOUS | Status: DC
  Filled 2019-12-18: qty 100

## 2019-12-18 MED ORDER — VANCOMYCIN HCL 1500 MG/300ML IV SOLN
1500.0000 mg | INTRAVENOUS | Status: DC
  Filled 2019-12-18: qty 300

## 2019-12-18 MED ORDER — METOPROLOL TARTRATE 12.5 MG HALF TABLET
12.5000 mg | ORAL_TABLET | Freq: Once | ORAL | Status: AC
  Administered 2019-12-19: 12.5 mg via ORAL
  Filled 2019-12-18: qty 1

## 2019-12-18 MED ORDER — BISACODYL 5 MG PO TBEC
5.0000 mg | DELAYED_RELEASE_TABLET | Freq: Once | ORAL | Status: AC
  Administered 2019-12-18: 5 mg via ORAL
  Filled 2019-12-18: qty 1

## 2019-12-18 MED ORDER — EPINEPHRINE HCL 5 MG/250ML IV SOLN IN NS
0.0000 ug/min | INTRAVENOUS | Status: AC
  Administered 2019-12-19: 5 ug/min via INTRAVENOUS
  Filled 2019-12-18: qty 250

## 2019-12-18 MED ORDER — SODIUM CHLORIDE 0.9 % IV SOLN
750.0000 mg | INTRAVENOUS | Status: AC
  Administered 2019-12-19: 750 mg via INTRAVENOUS
  Filled 2019-12-18: qty 750

## 2019-12-18 MED ORDER — MAGNESIUM SULFATE 50 % IJ SOLN
40.0000 meq | INTRAMUSCULAR | Status: DC
  Filled 2019-12-18: qty 9.85

## 2019-12-18 MED ORDER — NOREPINEPHRINE 4 MG/250ML-% IV SOLN
0.0000 ug/min | INTRAVENOUS | Status: AC
  Administered 2019-12-19: 2 ug/min via INTRAVENOUS
  Filled 2019-12-18: qty 250

## 2019-12-18 MED ORDER — INSULIN REGULAR(HUMAN) IN NACL 100-0.9 UT/100ML-% IV SOLN
INTRAVENOUS | Status: DC
  Filled 2019-12-18: qty 100

## 2019-12-18 MED ORDER — EPINEPHRINE HCL 5 MG/250ML IV SOLN IN NS
0.0000 ug/min | INTRAVENOUS | Status: DC
  Filled 2019-12-18: qty 250

## 2019-12-18 MED ORDER — TRANEXAMIC ACID 1000 MG/10ML IV SOLN
1.5000 mg/kg/h | INTRAVENOUS | Status: AC
  Administered 2019-12-19: 1.5 mg/kg/h via INTRAVENOUS
  Filled 2019-12-18: qty 25

## 2019-12-18 MED ORDER — PLASMA-LYTE 148 IV SOLN
INTRAVENOUS | Status: DC
  Filled 2019-12-18: qty 2.5

## 2019-12-18 MED ORDER — NITROGLYCERIN IN D5W 200-5 MCG/ML-% IV SOLN: 2.0000 ug/min | INTRAVENOUS | Status: DC

## 2019-12-18 MED ORDER — TRANEXAMIC ACID (OHS) PUMP PRIME SOLUTION
2.0000 mg/kg | INTRAVENOUS | Status: DC
  Filled 2019-12-18: qty 1.69

## 2019-12-18 MED ORDER — MAGNESIUM SULFATE 2 GM/50ML IV SOLN
2.0000 g | Freq: Once | INTRAVENOUS | Status: AC
  Administered 2019-12-18: 2 g via INTRAVENOUS
  Filled 2019-12-18: qty 50

## 2019-12-18 MED ORDER — SODIUM CHLORIDE 0.9 % IV SOLN
INTRAVENOUS | Status: DC
  Filled 2019-12-18: qty 30

## 2019-12-18 MED ORDER — SODIUM CHLORIDE 0.9 % IV SOLN
1.5000 g | INTRAVENOUS | Status: DC
  Filled 2019-12-18: qty 1.5

## 2019-12-18 MED ORDER — VANCOMYCIN HCL 1000 MG IV SOLR
1000.0000 mg | INTRAVENOUS | Status: DC
  Filled 2019-12-18: qty 1000

## 2019-12-18 MED ORDER — FUROSEMIDE 10 MG/ML IJ SOLN
40.0000 mg | Freq: Once | INTRAMUSCULAR | Status: AC
  Administered 2019-12-18: 40 mg via INTRAVENOUS
  Filled 2019-12-18: qty 4

## 2019-12-18 MED ORDER — SODIUM CHLORIDE 0.9 % IV SOLN
600.0000 mg | INTRAVENOUS | Status: DC
  Filled 2019-12-18: qty 600

## 2019-12-18 MED ORDER — POTASSIUM CHLORIDE CRYS ER 20 MEQ PO TBCR
30.0000 meq | EXTENDED_RELEASE_TABLET | Freq: Once | ORAL | Status: AC
  Administered 2019-12-18: 30 meq via ORAL
  Filled 2019-12-18: qty 1

## 2019-12-18 MED ORDER — PHENYLEPHRINE HCL-NACL 20-0.9 MG/250ML-% IV SOLN
30.0000 ug/min | INTRAVENOUS | Status: AC
  Administered 2019-12-19: 15 ug/min via INTRAVENOUS
  Filled 2019-12-18: qty 250

## 2019-12-18 MED ORDER — NITROGLYCERIN IN D5W 200-5 MCG/ML-% IV SOLN
0.0000 ug/min | INTRAVENOUS | Status: DC
  Filled 2019-12-18: qty 250

## 2019-12-18 MED ORDER — VASOPRESSIN 20 UNITS/100 ML INFUSION FOR SHOCK
0.0400 [IU]/min | INTRAVENOUS | Status: DC
  Filled 2019-12-18: qty 100

## 2019-12-18 MED ORDER — CHLORHEXIDINE GLUCONATE CLOTH 2 % EX PADS
6.0000 | MEDICATED_PAD | Freq: Once | CUTANEOUS | Status: AC
  Administered 2019-12-19: 6 via TOPICAL

## 2019-12-18 MED ORDER — DEXMEDETOMIDINE HCL IN NACL 400 MCG/100ML IV SOLN
0.1000 ug/kg/h | INTRAVENOUS | Status: DC
  Filled 2019-12-18: qty 100

## 2019-12-18 MED ORDER — SODIUM CHLORIDE 0.9 % IV SOLN
1.5000 g | INTRAVENOUS | Status: AC
  Administered 2019-12-19: 1.5 g via INTRAVENOUS
  Filled 2019-12-18: qty 1.5

## 2019-12-18 MED ORDER — DOBUTAMINE IN D5W 4-5 MG/ML-% IV SOLN
2.0000 ug/kg/min | INTRAVENOUS | Status: DC
  Filled 2019-12-18: qty 250

## 2019-12-18 MED ORDER — INSULIN REGULAR(HUMAN) IN NACL 100-0.9 UT/100ML-% IV SOLN
INTRAVENOUS | Status: AC
  Administered 2019-12-19: 1.5 [IU]/h via INTRAVENOUS

## 2019-12-18 MED ORDER — POTASSIUM CHLORIDE CRYS ER 20 MEQ PO TBCR: 20.0000 meq | EXTENDED_RELEASE_TABLET | Freq: Once | ORAL | Status: DC

## 2019-12-18 MED ORDER — TRANEXAMIC ACID 1000 MG/10ML IV SOLN
1.5000 mg/kg/h | INTRAVENOUS | Status: DC
  Filled 2019-12-18: qty 25

## 2019-12-18 MED ORDER — TRANEXAMIC ACID (OHS) BOLUS VIA INFUSION
15.0000 mg/kg | INTRAVENOUS | Status: AC
  Administered 2019-12-19: 1267.5 mg via INTRAVENOUS
  Filled 2019-12-18: qty 1268

## 2019-12-18 MED ORDER — TRANEXAMIC ACID (OHS) BOLUS VIA INFUSION
15.0000 mg/kg | INTRAVENOUS | Status: DC
  Filled 2019-12-18: qty 1268

## 2019-12-18 MED ORDER — NOREPINEPHRINE 4 MG/250ML-% IV SOLN
0.0000 ug/min | INTRAVENOUS | Status: DC
  Filled 2019-12-18: qty 250

## 2019-12-18 MED ORDER — MILRINONE LACTATE IN DEXTROSE 20-5 MG/100ML-% IV SOLN: 0.3000 ug/kg/min | INTRAVENOUS | Status: DC

## 2019-12-18 MED ORDER — DOPAMINE-DEXTROSE 3.2-5 MG/ML-% IV SOLN
0.0000 ug/kg/min | INTRAVENOUS | Status: DC
  Filled 2019-12-18: qty 250

## 2019-12-18 MED ORDER — FLUCONAZOLE IN SODIUM CHLORIDE 400-0.9 MG/200ML-% IV SOLN
400.0000 mg | INTRAVENOUS | Status: DC
  Filled 2019-12-18: qty 200

## 2019-12-18 MED ORDER — VANCOMYCIN HCL 1500 MG/300ML IV SOLN
1500.0000 mg | INTRAVENOUS | Status: AC
  Administered 2019-12-19: 1500 mg via INTRAVENOUS
  Filled 2019-12-18: qty 300

## 2019-12-18 MED ORDER — DEXMEDETOMIDINE HCL IN NACL 400 MCG/100ML IV SOLN
0.1000 ug/kg/h | INTRAVENOUS | Status: AC
  Administered 2019-12-19: .3 ug/kg/h via INTRAVENOUS
  Filled 2019-12-18: qty 100

## 2019-12-18 MED ORDER — SODIUM CHLORIDE 0.9 % IV SOLN
750.0000 mg | INTRAVENOUS | Status: DC
  Filled 2019-12-18: qty 750

## 2019-12-18 MED ORDER — PHENYLEPHRINE HCL-NACL 20-0.9 MG/250ML-% IV SOLN
0.0000 ug/min | INTRAVENOUS | Status: DC
  Filled 2019-12-18: qty 250

## 2019-12-18 NOTE — Progress Notes (Signed)
Received call from blood bank.  Blood is ready for him.

## 2019-12-18 NOTE — Progress Notes (Addendum)
Patient ID: Joseph Greer, male   DOB: [birth year], 64 y.o.   MRN: [id number]     Advanced Heart Failure Rounding Note  PCP-Cardiologist: Candee Furbish, MD  AHF:  Dr. [name]   Patient Profile   64 y/o male w/ h/o Hep C s/p liver transplant in 2009 (followed at [facility]), Stage III CKD, HTN and T2DM admitted w/ NSTEMI and Afib w/ RVR, found to have severe 3VD and biventricular heart failure and ? PFO, LVEF 20-25%, RV moderately reduced. Awaiting CABG.    Subjective:    Yesterday milrinone was started 0.125 mcg and diuresed with IV lasix. I/O not accurate. Weight down 1 pound. CVP 2   Denies SOB. Denies chest pain.    RHC Procedural Findings: Hemodynamics (mmHg) RA mean 8 RV 55/11 PA 60/30, mean 44 PCWP mean 36 Oxygen saturations: PA 62% AO 97% Cardiac Output (Fick) 5.3  Cardiac Index (Fick) 2.61 Cardiac Output (Thermo) 5.11 Cardiac Index (Thermo) 2.52  PVR 1.6 WU  Objective:   Weight Range: 84.5 kg Body mass index is 26.73 kg/m.   Vital Signs:   Temp:  [97.7 F (36.5 C)-97.8 F (36.6 C)] 97.7 F (36.5 C) (07/15 0525) Pulse Rate:  [80-91] 90 (07/15 0525) Resp:  [13-20] 18 (07/15 0525) BP: (134-153)/(79-104) 134/79 (07/15 0525) SpO2:  [96 %-98 %] 96 % (07/15 0525) Weight:  [84.5 kg] 84.5 kg (07/15 0525) Last BM Date:  (7/12)  Weight change: Filed Weights   12/16/19 0231 12/17/19 0348 12/18/19 0525  Weight: 86.5 kg 85.2 kg 84.5 kg    Intake/Output:   Intake/Output Summary (Last 24 hours) at 12/18/2019 0914 Last data filed at 12/18/2019 0300 Gross per 24 hour  Intake 1240.2 ml  Output 1795 ml  Net -554.8 ml      Physical Exam   CVP 1-2  General:  Sitting in the chair. No resp difficulty HEENT: normal Neck: supple. no JVD. Carotids 2+ bilat; no bruits. No lymphadenopathy or thryomegaly appreciated. Cor: PMI nondisplaced. Regular rate & rhythm. No rubs, gallops or murmurs. Lungs: clear Abdomen: soft, nontender, nondistended. No hepatosplenomegaly. No  bruits or masses. Good bowel sounds. Extremities: no cyanosis, clubbing, rash, edema. RUE PICC  Neuro: alert & orientedx3, cranial nerves grossly intact. moves all 4 extremities w/o difficulty. Affect pleasant   Telemetry   NSR 80s   EKG    No new EKG to review today.   Labs    CBC Recent Labs    12/17/19 0432 12/17/19 0432 12/17/19 0915 12/18/19 0500  WBC 5.6  --   --  6.0  HGB 10.7*   < > 11.2* 10.7*  HCT 34.8*   < > 33.0* 34.6*  MCV 84.3  --   --  83.2  PLT 169  --   --  173   < > = values in this interval not displayed.   Basic Metabolic Panel Recent Labs    12/16/19 0444 12/16/19 0444 12/17/19 0432 12/17/19 0432 12/17/19 0915 12/18/19 0500  NA 140   < > 139   < > 143 140  K 3.5   < > 3.4*   < > 4.0 3.6  CL 106   < > 104  --   --  103  CO2 25   < > 24  --   --  25  GLUCOSE 120*   < > 111*  --   --  112*  BUN 27*   < > 27*  --   --  25*  CREATININE 2.02*   < > 2.26*  --   --  2.13*  CALCIUM 8.6*   < > 8.3*  --   --  8.6*  MG 1.8  --   --   --   --  1.7   < > = values in this interval not displayed.   Liver Function Tests Recent Labs    12/18/19 0500  AST 26  ALT 32  ALKPHOS 90  BILITOT 0.8  PROT 6.5  ALBUMIN 3.1*   No results for input(s): LIPASE, AMYLASE in the last 72 hours. Cardiac Enzymes No results for input(s): CKTOTAL, CKMB, CKMBINDEX, TROPONINI in the last 72 hours.  BNP: BNP (last 3 results) No results for input(s): BNP in the last 8760 hours.  ProBNP (last 3 results) No results for input(s): PROBNP in the last 8760 hours.   D-Dimer No results for input(s): DDIMER in the last 72 hours. Hemoglobin A1C No results for input(s): HGBA1C in the last 72 hours. Fasting Lipid Panel No results for input(s): CHOL, HDL, LDLCALC, TRIG, CHOLHDL, LDLDIRECT in the last 72 hours. Thyroid Function Tests No results for input(s): TSH, T4TOTAL, T3FREE, THYROIDAB in the last 72 hours.  Invalid input(s): FREET3  Other results:   Imaging     CT chest without contrast  Result Date: 12/17/2019 CLINICAL DATA:  Coronary artery disease.  Preoperative examination. EXAM: CT CHEST WITHOUT CONTRAST TECHNIQUE: Multidetector CT imaging of the chest was performed following the standard protocol without IV contrast. COMPARISON:  None. FINDINGS: Cardiovascular: There is extensive coronary artery calcification identified. Left ventricular and left atrial dilation with mild global resultant cardiomegaly. Enlargement of the central pulmonary arteries is in keeping with pulmonary arterial hypertension, possibly the result of left heart failure. No pericardial effusion. The thoracic aorta is normal in course and caliber. There is no significant mural calcification in the ascending segment. Normal configuration of the arch vasculature. Right upper extremity PICC line tip noted within the superior right atrium. Mediastinum/Nodes: No pathologic thoracic adenopathy. Thyroid unremarkable. Esophagus unremarkable. Lungs/Pleura: Small bilateral pleural effusions are present with bibasilar compressive atelectasis. There is mild smooth interlobular septal thickening noted at the lung bases in keeping with trace pulmonary edema, likely cardiogenic in nature. No confluent pulmonary infiltrates. No focal pulmonary nodules. No pneumothorax. Central airways are widely patent. Upper Abdomen: Absence of the left hepatic lobe suggesting prior surgical resection or atrophy Musculoskeletal: No acute bone abnormality. IMPRESSION: Mild cardiogenic failure. Extensive coronary artery calcification. Dilation of the left ventricle and left atrium. Enlarged central pulmonary arteries in keeping with pulmonary arterial hypertension, likely the result of left heart failure. No significant calcification within the ascending aorta. Electronically Signed   By: Fidela Salisbury MD   On: 12/17/2019 21:08     Medications:     Scheduled Medications: . amiodarone  400 mg Oral BID  . Apremilast   1 tablet Oral BID  . aspirin EC  81 mg Oral Daily  . atorvastatin  80 mg Oral Daily  . Chlorhexidine Gluconate Cloth  6 each Topical Daily  . metoprolol succinate  25 mg Oral Daily  . mupirocin cream   Topical BID  . sodium chloride flush  10-40 mL Intracatheter Q12H  . sodium chloride flush  3 mL Intravenous Q12H  . sodium chloride flush  3 mL Intravenous Q12H  . sodium chloride flush  3 mL Intravenous Q12H  . tacrolimus  2 mg Oral BID  . venlafaxine XR  75 mg Oral Daily  Infusions: . sodium chloride    . sodium chloride    . heparin 1,500 Units/hr (12/18/19 0846)  . milrinone 0.125 mcg/kg/min (12/17/19 1025)    PRN Medications: sodium chloride, sodium chloride, acetaminophen, ALPRAZolam, nitroGLYCERIN, ondansetron (ZOFRAN) IV, sodium chloride flush, sodium chloride flush, sodium chloride flush, zolpidem    Assessment/Plan   1. CAD: Severe 3VD on cath 7/21 => 60-70% ostial LAD, 80% mLAD, 95% ramus, 99% proximal LCx with TIMI-2 flow down the rest of the LCx, occluded distal RCA.  Suspect out of hospital inferolateral MI.  Cardiac MRI showed that the inferolateral wall is likely not viable, but the remainder of the LV myocardium is likely viable.  - Continue ASA 81 daily and atorvastatin 80 mg daily.  - Continue metoprolol succinate 25 mg daily  - Discussed with Dr. Orvan Seen, will aim for high-risk CABG Friday.  Per Dr [name] --> would benefit the most from this approach.  Would recommend placement of Impella 5.5 at time of surgery.  2. Acute on chronic systolic CHF: Echo with EF 20-25%, moderately decreased RV systolic function. Cardiac MRI with LV EF 26%, RV EF 38%, near full thickness scar in inferolateral wall suggesting lack of viability in the LCx territory.  RHC this morning showed significantly elevated PCWP (mean 36) with pulmonary venous hypertension, but RA pressure is only mildly elevated (RA pressure 8).  Cardiac index preserved.  Patient denies dyspnea.  - With  significantly elevated PCWP, need diuresis, but will have to follow creatinine carefully => CVP down to 2. Give one more dose of 40 mg IV lasix.   - With elevated PA pressure (pulmonary venous hypertension) and renal dysfunction, milrinone 0.125 mcg/kg/min added while diuresing even though cardiac output is relatively preserved.   - With elevated creatinine, hold off for now on Entresto, digoxin, spironolactone.   - As above, would recommend Impella 5.5 with CABG.  3. Atrial fibrillation: Patient admitted with afib/RVR, now in NSR.  - Continue amiodarone 400 mg bid, watch closely on milrinone, may need to transition to IV.  - Would recommend Maze and LA appendage clip with CABG.  - Continue heparin gtt.  4. Possible PFO: Intraop TEE to make definitive diagnosis, would close at time of surgery if present.  5. AKI on CKD stage 3: Creatine has been stable 1.9-2 range in hospital.  Was 1.3 back in 4/21. Creatinine down a little with addition of milrinone. 2.3>2.1  - As above, will use low dose milrinone with diuresis.   6. Type 2 diabetes: SSI. SGLT2 inhibitor as outpatient.  7. Liver transplant for HCV: Continue tacrolimus, will need to follow levels (sent).   Optimized for CABG. HF Team will follow after surgery.   Length of Stay: [place], NP  12/18/2019, 9:14 AM  Advanced Heart Failure Team Pager [phone] (M-F; 7a - 4p)  Please contact [facility] for night-coverage after hours (4p -7a ) and weekends on [email or web address]  Patient seen with NP, agree with the above note.   He feels good today, no dyspnea.  He got Lasix 40 mg IV this morning, CVP down to 2. Co-ox 57%.  He remains in NSR on heparin gtt.   General: NAD Neck: No JVD, no thyromegaly or thyroid nodule.  Lungs: Clear to auscultation bilaterally with normal respiratory effort. CV: Nondisplaced PMI.  Heart regular S1/S2, no S3/S4, no murmur.  No peripheral edema.   Abdomen: Soft, nontender, no hepatosplenomegaly, no  distention.  Skin: Intact without lesions or rashes.  Neurologic:  Alert and oriented x 3.  Psych: Normal affect. Extremities: No clubbing or cyanosis.  HEENT: Normal.   He should be ready for CABG + Maze + appendage closure + ?PFO closure tomorrow in the OR.  Will not give any more Lasix.  Creatinine lower at 2.1, this is likely as good as we will get him.  Continue milrinone 0.125 mcg/kg/min until surgery.   Pending tacrolimus level.   Loralie Champagne 12/18/2019 3:04 PM

## 2019-12-18 NOTE — Anesthesia Preprocedure Evaluation (Addendum)
Anesthesia Evaluation  Patient identified by MRN, date of birth, ID band Patient awake    Reviewed: Allergy & Precautions, NPO status , Patient's Chart, lab work & pertinent test results, reviewed documented beta blocker date and time   Airway Mallampati: II  TM Distance: >3 FB Neck ROM: Full    Dental  (+) Dental Advisory Given, Missing,    Pulmonary former smoker,    Pulmonary exam normal breath sounds clear to auscultation       Cardiovascular hypertension, Pt. on medications + Past MI  Normal cardiovascular exam Rhythm:Regular Rate:Normal  Echo 12/12/19: 1. LVEF is depressed with severe hypokinesis in all segments except  septum. . Left ventricular ejection fraction, by estimation, is 20 to 25%.  The left ventricle has severely decreased function. The left ventricular  internal cavity size was severely  dilated. Left ventricular diastolic parameters are indeterminate.  2. Right ventricular systolic function is moderately reduced. The right  ventricular size is normal.  3. Left atrial size was mild to moderately dilated.  4. Turbulent flow along intraatrial septum suspicious for PFO . Evidence  of atrial level shunting detected by color flow Doppler.  5. The mitral valve is abnormal. Mild to moderate mitral valve  regurgitation.  6. The aortic valve is normal in structure. Aortic valve regurgitation is  trivial.   Neuro/Psych PSYCHIATRIC DISORDERS Anxiety Depression negative neurological ROS     GI/Hepatic negative GI ROS, (+) Hepatitis -, CS/p liver transplant    Endo/Other  diabetes, Type 2  Renal/GU Renal InsufficiencyRenal disease     Musculoskeletal negative musculoskeletal ROS (+)   Abdominal   Peds  Hematology negative hematology ROS (+)   Anesthesia Other Findings   Reproductive/Obstetrics                            Anesthesia Physical Anesthesia Plan  ASA:  IV  Anesthesia Plan: General   Post-op Pain Management:    Induction: Intravenous  PONV Risk Score and Plan: 2 and Treatment may vary due to age or medical condition and Midazolam  Airway Management Planned: Oral ETT  Additional Equipment: Arterial line, CVP, PA Cath, TEE and Ultrasound Guidance Line Placement  Intra-op Plan:   Post-operative Plan: Post-operative intubation/ventilation  Informed Consent: I have reviewed the patients History and Physical, chart, labs and discussed the procedure including the risks, benefits and alternatives for the proposed anesthesia with the patient or authorized representative who has indicated his/her understanding and acceptance.     Dental advisory given  Plan Discussed with: CRNA  Anesthesia Plan Comments:        Anesthesia Quick Evaluation

## 2019-12-18 NOTE — Progress Notes (Signed)
      [address]       [place] [address]             [phone]     Tacrolimus level is pending  Feels well, denies CP or SOB, up bathing at sink     Today's Vitals   12/17/19 1637 12/17/19 2038 12/17/19 2100 12/18/19 0525  BP: (!) 148/86 140/79  134/79  Pulse: 80 91  90  Resp: 18 18  18   Temp: 97.8 F (36.6 C) 97.7 F (36.5 C)  97.7 F (36.5 C)  TempSrc: Oral Oral  Oral  SpO2: 97% 98%  96%  Weight:    84.5 kg  Height:      PainSc:   0-No pain    Body mass index is 26.73 kg/m.    Lungs- mildly decreased in bases Cor - RRR  Plan for Or tomorrow with Dr Orvan Seen preop orders entered  John Giovanni, PA-C

## 2019-12-18 NOTE — Progress Notes (Signed)
[facility] for heparin Indication: chest pain/ACS   No Known Allergies  Patient Measurements: Height: 5\' 10"  (177.8 cm) Weight: 84.5 kg (186 lb 4.8 oz) IBW/kg (Calculated) : 73  Vital Signs: Temp: 97.7 F (36.5 C) (07/15 0525) Temp Source: Oral (07/15 0525) BP: 134/79 (07/15 0525) Pulse Rate: 90 (07/15 0525)  Labs: Recent Labs    12/16/19 0444 12/16/19 0444 12/16/19 1405 12/17/19 0432 12/17/19 0432 12/17/19 0435 12/17/19 0915 12/18/19 0500  HGB 11.4*   < >  --  10.7*   < >  --  11.2* 10.7*  HCT 36.8*   < >  --  34.8*  --   --  33.0* 34.6*  PLT 204  --   --  169  --   --   --  173  HEPARINUNFRC 0.26*   < > 0.33  --   --  0.34  --  0.37  CREATININE 2.02*  --   --  2.26*  --   --   --  2.13*   < > = values in this interval not displayed.    Estimated Creatinine Clearance: 37.1 mL/min (A) (by C-G formula based on SCr of 2.13 mg/dL (H)).  Assessment: 64 yo m transferred from OSH on heparin for NSTEMI. No anticoagulation PTA. CABG scheduled 7/16.   Heparin level remains therapeutic at 0.34 on 1500 units/hr. Hgb 10.7, plt 173. No s/sx of bleeding or infusion issues. CABG workup ongoing.  Goal of Therapy:  Heparin level 0.3-0.7 units/ml Monitor platelets by anticoagulation protocol: Yes   Plan:  Continue heparin 1500 units/h Daily heparin level and CBC  Antonietta Jewel, PharmD, [place] Pharmacist  Phone: [phone] 12/18/2019 9:05 AM  Please check AMION for all [place] phone numbers After 10:00 PM, call [place] [phone]

## 2019-12-18 NOTE — Progress Notes (Signed)
CARDIAC REHAB PHASE I   PRE:  Rate/Rhythm: 90 SR with PACs    BP: sitting 138/88    SaO2: 97 RA  MODE:  Ambulation: 770 ft   POST:  Rate/Rhythm: 104 ST    BP: sitting 137/88    SaO2: 97 RA  Pt tolerated walk well. Has been doing IS. He is asking questions regarding Impella and when he can walk after surgery. Encouraged him to discuss with surgeon today. To recliner, feeling well. No SOB walking. [place], ACSM 12/18/2019 3:03 PM

## 2019-12-19 ENCOUNTER — Inpatient Hospital Stay (HOSPITAL_COMMUNITY): Payer: Medicare HMO | Admitting: Certified Registered"

## 2019-12-19 ENCOUNTER — Encounter (HOSPITAL_COMMUNITY): Payer: Self-pay | Admitting: Cardiology

## 2019-12-19 ENCOUNTER — Inpatient Hospital Stay (HOSPITAL_COMMUNITY)
Admission: EM | Disposition: A | Discharge status: Home Health | Payer: Self-pay | Source: Other Acute Inpatient Hospital | Attending: Cardiothoracic Surgery

## 2019-12-19 ENCOUNTER — Inpatient Hospital Stay (HOSPITAL_COMMUNITY): Payer: Medicare HMO

## 2019-12-19 DIAGNOSIS — I25110 Atherosclerotic heart disease of native coronary artery with unstable angina pectoris: Secondary | ICD-10-CM

## 2019-12-19 DIAGNOSIS — I480 Paroxysmal atrial fibrillation: Secondary | ICD-10-CM

## 2019-12-19 DIAGNOSIS — Z951 Presence of aortocoronary bypass graft: Secondary | ICD-10-CM | POA: Insufficient documentation

## 2019-12-19 DIAGNOSIS — Q211 Atrial septal defect: Secondary | ICD-10-CM

## 2019-12-19 HISTORY — PX: TEE WITHOUT CARDIOVERSION: SHX5443

## 2019-12-19 HISTORY — PX: MAZE: SHX5063

## 2019-12-19 HISTORY — PX: CLIPPING OF ATRIAL APPENDAGE: SHX5773

## 2019-12-19 HISTORY — PX: CORONARY ARTERY BYPASS GRAFT: SHX141

## 2019-12-19 LAB — POCT I-STAT, CHEM 8
BUN: 24 mg/dL — ABNORMAL HIGH (ref 8–23)
BUN: 23 mg/dL (ref 8–23)
BUN: 24 mg/dL — ABNORMAL HIGH (ref 8–23)
BUN: 23 mg/dL (ref 8–23)
BUN: 24 mg/dL — ABNORMAL HIGH (ref 8–23)
BUN: 25 mg/dL — ABNORMAL HIGH (ref 8–23)
BUN: 25 mg/dL — ABNORMAL HIGH (ref 8–23)
Calcium, Ion: 1.2 mmol/L (ref 1.15–1.40)
Calcium, Ion: 1.15 mmol/L (ref 1.15–1.40)
Calcium, Ion: 1.18 mmol/L (ref 1.15–1.40)
Calcium, Ion: 1.09 mmol/L — ABNORMAL LOW (ref 1.15–1.40)
Calcium, Ion: 1.07 mmol/L — ABNORMAL LOW (ref 1.15–1.40)
Calcium, Ion: 1.05 mmol/L — ABNORMAL LOW (ref 1.15–1.40)
Calcium, Ion: 1.26 mmol/L (ref 1.15–1.40)
Chloride: 103 mmol/L (ref 98–111)
Chloride: 103 mmol/L (ref 98–111)
Chloride: 104 mmol/L (ref 98–111)
Chloride: 103 mmol/L (ref 98–111)
Chloride: 104 mmol/L (ref 98–111)
Chloride: 102 mmol/L (ref 98–111)
Chloride: 104 mmol/L (ref 98–111)
Creatinine, Ser: 2.3 mg/dL — ABNORMAL HIGH (ref 0.61–1.24)
Creatinine, Ser: 2.2 mg/dL — ABNORMAL HIGH (ref 0.61–1.24)
Creatinine, Ser: 2.2 mg/dL — ABNORMAL HIGH (ref 0.61–1.24)
Creatinine, Ser: 2.1 mg/dL — ABNORMAL HIGH (ref 0.61–1.24)
Creatinine, Ser: 2.1 mg/dL — ABNORMAL HIGH (ref 0.61–1.24)
Creatinine, Ser: 1.9 mg/dL — ABNORMAL HIGH (ref 0.61–1.24)
Creatinine, Ser: 2 mg/dL — ABNORMAL HIGH (ref 0.61–1.24)
Glucose, Bld: 119 mg/dL — ABNORMAL HIGH (ref 70–99)
Glucose, Bld: 114 mg/dL — ABNORMAL HIGH (ref 70–99)
Glucose, Bld: 115 mg/dL — ABNORMAL HIGH (ref 70–99)
Glucose, Bld: 137 mg/dL — ABNORMAL HIGH (ref 70–99)
Glucose, Bld: 142 mg/dL — ABNORMAL HIGH (ref 70–99)
Glucose, Bld: 131 mg/dL — ABNORMAL HIGH (ref 70–99)
Glucose, Bld: 168 mg/dL — ABNORMAL HIGH (ref 70–99)
HCT: 34 % — ABNORMAL LOW (ref 39.0–52.0)
HCT: 29 % — ABNORMAL LOW (ref 39.0–52.0)
HCT: 30 % — ABNORMAL LOW (ref 39.0–52.0)
HCT: 26 % — ABNORMAL LOW (ref 39.0–52.0)
HCT: 26 % — ABNORMAL LOW (ref 39.0–52.0)
HCT: 22 % — ABNORMAL LOW (ref 39.0–52.0)
HCT: 23 % — ABNORMAL LOW (ref 39.0–52.0)
Hemoglobin: 11.6 g/dL — ABNORMAL LOW (ref 13.0–17.0)
Hemoglobin: 10.2 g/dL — ABNORMAL LOW (ref 13.0–17.0)
Hemoglobin: 9.9 g/dL — ABNORMAL LOW (ref 13.0–17.0)
Hemoglobin: 8.8 g/dL — ABNORMAL LOW (ref 13.0–17.0)
Hemoglobin: 8.8 g/dL — ABNORMAL LOW (ref 13.0–17.0)
Hemoglobin: 7.5 g/dL — ABNORMAL LOW (ref 13.0–17.0)
Hemoglobin: 7.8 g/dL — ABNORMAL LOW (ref 13.0–17.0)
Potassium: 4.1 mmol/L (ref 3.5–5.1)
Potassium: 4.2 mmol/L (ref 3.5–5.1)
Potassium: 4.4 mmol/L (ref 3.5–5.1)
Potassium: 4.7 mmol/L (ref 3.5–5.1)
Potassium: 4 mmol/L (ref 3.5–5.1)
Potassium: 5 mmol/L (ref 3.5–5.1)
Potassium: 3.8 mmol/L (ref 3.5–5.1)
Sodium: 142 mmol/L (ref 135–145)
Sodium: 141 mmol/L (ref 135–145)
Sodium: 141 mmol/L (ref 135–145)
Sodium: 141 mmol/L (ref 135–145)
Sodium: 142 mmol/L (ref 135–145)
Sodium: 138 mmol/L (ref 135–145)
Sodium: 141 mmol/L (ref 135–145)
TCO2: 25 mmol/L (ref 22–32)
TCO2: 24 mmol/L (ref 22–32)
TCO2: 25 mmol/L (ref 22–32)
TCO2: 25 mmol/L (ref 22–32)
TCO2: 26 mmol/L (ref 22–32)
TCO2: 24 mmol/L (ref 22–32)
TCO2: 23 mmol/L (ref 22–32)

## 2019-12-19 LAB — COOXEMETRY PANEL
Carboxyhemoglobin: 1.5 % (ref 0.5–1.5)
Methemoglobin: 1.2 % (ref 0.0–1.5)
O2 Saturation: 60.6 %
Total hemoglobin: 11.4 g/dL — ABNORMAL LOW (ref 12.0–16.0)

## 2019-12-19 LAB — POCT I-STAT 7, (LYTES, BLD GAS, ICA,H+H)
Acid-Base Excess: 3 mmol/L — ABNORMAL HIGH (ref 0.0–2.0)
Acid-Base Excess: 3 mmol/L — ABNORMAL HIGH (ref 0.0–2.0)
Acid-Base Excess: 2 mmol/L (ref 0.0–2.0)
Acid-base deficit: 3 mmol/L — ABNORMAL HIGH (ref 0.0–2.0)
Acid-base deficit: 4 mmol/L — ABNORMAL HIGH (ref 0.0–2.0)
Acid-base deficit: 4 mmol/L — ABNORMAL HIGH (ref 0.0–2.0)
Acid-base deficit: 4 mmol/L — ABNORMAL HIGH (ref 0.0–2.0)
Acid-base deficit: 4 mmol/L — ABNORMAL HIGH (ref 0.0–2.0)
Acid-base deficit: 5 mmol/L — ABNORMAL HIGH (ref 0.0–2.0)
Acid-base deficit: 7 mmol/L — ABNORMAL HIGH (ref 0.0–2.0)
Bicarbonate: 20.4 mmol/L (ref 20.0–28.0)
Bicarbonate: 21.3 mmol/L (ref 20.0–28.0)
Bicarbonate: 21.4 mmol/L (ref 20.0–28.0)
Bicarbonate: 21.4 mmol/L (ref 20.0–28.0)
Bicarbonate: 22.5 mmol/L (ref 20.0–28.0)
Bicarbonate: 23.2 mmol/L (ref 20.0–28.0)
Bicarbonate: 23.8 mmol/L (ref 20.0–28.0)
Bicarbonate: 26.6 mmol/L (ref 20.0–28.0)
Bicarbonate: 27.2 mmol/L (ref 20.0–28.0)
Bicarbonate: 27.7 mmol/L (ref 20.0–28.0)
Calcium, Ion: 1.01 mmol/L — ABNORMAL LOW (ref 1.15–1.40)
Calcium, Ion: 1.05 mmol/L — ABNORMAL LOW (ref 1.15–1.40)
Calcium, Ion: 1.09 mmol/L — ABNORMAL LOW (ref 1.15–1.40)
Calcium, Ion: 1.16 mmol/L (ref 1.15–1.40)
Calcium, Ion: 1.16 mmol/L (ref 1.15–1.40)
Calcium, Ion: 1.17 mmol/L (ref 1.15–1.40)
Calcium, Ion: 1.17 mmol/L (ref 1.15–1.40)
Calcium, Ion: 1.18 mmol/L (ref 1.15–1.40)
Calcium, Ion: 1.2 mmol/L (ref 1.15–1.40)
Calcium, Ion: 1.21 mmol/L (ref 1.15–1.40)
HCT: 26 % — ABNORMAL LOW (ref 39.0–52.0)
HCT: 26 % — ABNORMAL LOW (ref 39.0–52.0)
HCT: 26 % — ABNORMAL LOW (ref 39.0–52.0)
HCT: 26 % — ABNORMAL LOW (ref 39.0–52.0)
HCT: 26 % — ABNORMAL LOW (ref 39.0–52.0)
HCT: 27 % — ABNORMAL LOW (ref 39.0–52.0)
HCT: 27 % — ABNORMAL LOW (ref 39.0–52.0)
HCT: 27 % — ABNORMAL LOW (ref 39.0–52.0)
HCT: 28 % — ABNORMAL LOW (ref 39.0–52.0)
HCT: 28 % — ABNORMAL LOW (ref 39.0–52.0)
Hemoglobin: 8.8 g/dL — ABNORMAL LOW (ref 13.0–17.0)
Hemoglobin: 8.8 g/dL — ABNORMAL LOW (ref 13.0–17.0)
Hemoglobin: 8.8 g/dL — ABNORMAL LOW (ref 13.0–17.0)
Hemoglobin: 8.8 g/dL — ABNORMAL LOW (ref 13.0–17.0)
Hemoglobin: 8.8 g/dL — ABNORMAL LOW (ref 13.0–17.0)
Hemoglobin: 9.2 g/dL — ABNORMAL LOW (ref 13.0–17.0)
Hemoglobin: 9.2 g/dL — ABNORMAL LOW (ref 13.0–17.0)
Hemoglobin: 9.2 g/dL — ABNORMAL LOW (ref 13.0–17.0)
Hemoglobin: 9.5 g/dL — ABNORMAL LOW (ref 13.0–17.0)
Hemoglobin: 9.5 g/dL — ABNORMAL LOW (ref 13.0–17.0)
O2 Saturation: 100 %
O2 Saturation: 100 %
O2 Saturation: 100 %
O2 Saturation: 89 %
O2 Saturation: 93 %
O2 Saturation: 96 %
O2 Saturation: 97 %
O2 Saturation: 98 %
O2 Saturation: 98 %
O2 Saturation: 99 %
Patient temperature: 36.5
Patient temperature: 36.6
Patient temperature: 36.9
Potassium: 3.8 mmol/L (ref 3.5–5.1)
Potassium: 3.8 mmol/L (ref 3.5–5.1)
Potassium: 3.8 mmol/L (ref 3.5–5.1)
Potassium: 3.8 mmol/L (ref 3.5–5.1)
Potassium: 4 mmol/L (ref 3.5–5.1)
Potassium: 4 mmol/L (ref 3.5–5.1)
Potassium: 4 mmol/L (ref 3.5–5.1)
Potassium: 4.1 mmol/L (ref 3.5–5.1)
Potassium: 4.1 mmol/L (ref 3.5–5.1)
Potassium: 4.4 mmol/L (ref 3.5–5.1)
Sodium: 142 mmol/L (ref 135–145)
Sodium: 142 mmol/L (ref 135–145)
Sodium: 143 mmol/L (ref 135–145)
Sodium: 143 mmol/L (ref 135–145)
Sodium: 143 mmol/L (ref 135–145)
Sodium: 143 mmol/L (ref 135–145)
Sodium: 144 mmol/L (ref 135–145)
Sodium: 144 mmol/L (ref 135–145)
Sodium: 144 mmol/L (ref 135–145)
Sodium: 145 mmol/L (ref 135–145)
TCO2: 22 mmol/L (ref 22–32)
TCO2: 23 mmol/L (ref 22–32)
TCO2: 23 mmol/L (ref 22–32)
TCO2: 23 mmol/L (ref 22–32)
TCO2: 24 mmol/L (ref 22–32)
TCO2: 25 mmol/L (ref 22–32)
TCO2: 25 mmol/L (ref 22–32)
TCO2: 28 mmol/L (ref 22–32)
TCO2: 28 mmol/L (ref 22–32)
TCO2: 29 mmol/L (ref 22–32)
pCO2 arterial: 40.4 mmHg (ref 32.0–48.0)
pCO2 arterial: 40.6 mmHg (ref 32.0–48.0)
pCO2 arterial: 40.7 mmHg (ref 32.0–48.0)
pCO2 arterial: 40.7 mmHg (ref 32.0–48.0)
pCO2 arterial: 43.4 mmHg (ref 32.0–48.0)
pCO2 arterial: 44.3 mmHg (ref 32.0–48.0)
pCO2 arterial: 44.8 mmHg (ref 32.0–48.0)
pCO2 arterial: 45.6 mmHg (ref 32.0–48.0)
pCO2 arterial: 47 mmHg (ref 32.0–48.0)
pCO2 arterial: 52.8 mmHg — ABNORMAL HIGH (ref 32.0–48.0)
pH, Arterial: 7.258 — ABNORMAL LOW (ref 7.350–7.450)
pH, Arterial: 7.259 — ABNORMAL LOW (ref 7.350–7.450)
pH, Arterial: 7.301 — ABNORMAL LOW (ref 7.350–7.450)
pH, Arterial: 7.301 — ABNORMAL LOW (ref 7.350–7.450)
pH, Arterial: 7.313 — ABNORMAL LOW (ref 7.350–7.450)
pH, Arterial: 7.328 — ABNORMAL LOW (ref 7.350–7.450)
pH, Arterial: 7.328 — ABNORMAL LOW (ref 7.350–7.450)
pH, Arterial: 7.4 (ref 7.350–7.450)
pH, Arterial: 7.427 (ref 7.350–7.450)
pH, Arterial: 7.434 (ref 7.350–7.450)
pO2, Arterial: 108 mmHg (ref 83.0–108.0)
pO2, Arterial: 115 mmHg — ABNORMAL HIGH (ref 83.0–108.0)
pO2, Arterial: 147 mmHg — ABNORMAL HIGH (ref 83.0–108.0)
pO2, Arterial: 320 mmHg — ABNORMAL HIGH (ref 83.0–108.0)
pO2, Arterial: 330 mmHg — ABNORMAL HIGH (ref 83.0–108.0)
pO2, Arterial: 339 mmHg — ABNORMAL HIGH (ref 83.0–108.0)
pO2, Arterial: 63 mmHg — ABNORMAL LOW (ref 83.0–108.0)
pO2, Arterial: 72 mmHg — ABNORMAL LOW (ref 83.0–108.0)
pO2, Arterial: 93 mmHg (ref 83.0–108.0)
pO2, Arterial: 94 mmHg (ref 83.0–108.0)

## 2019-12-19 LAB — BASIC METABOLIC PANEL
Anion gap: 10 (ref 5–15)
Anion gap: 12 (ref 5–15)
BUN: 24 mg/dL — ABNORMAL HIGH (ref 8–23)
BUN: 20 mg/dL (ref 8–23)
CO2: 25 mmol/L (ref 22–32)
CO2: 20 mmol/L — ABNORMAL LOW (ref 22–32)
Calcium: 8.8 mg/dL — ABNORMAL LOW (ref 8.9–10.3)
Calcium: 7.9 mg/dL — ABNORMAL LOW (ref 8.9–10.3)
Chloride: 105 mmol/L (ref 98–111)
Chloride: 111 mmol/L (ref 98–111)
Creatinine, Ser: 2.31 mg/dL — ABNORMAL HIGH (ref 0.61–1.24)
Creatinine, Ser: 2.3 mg/dL — ABNORMAL HIGH (ref 0.61–1.24)
GFR calc Af Amer: 34 mL/min — ABNORMAL LOW (ref >60)
GFR calc Af Amer: 34 mL/min — ABNORMAL LOW (ref >60)
GFR calc non Af Amer: 29 mL/min — ABNORMAL LOW (ref >60)
GFR calc non Af Amer: 29 mL/min — ABNORMAL LOW (ref >60)
Glucose, Bld: 115 mg/dL — ABNORMAL HIGH (ref 70–99)
Glucose, Bld: 178 mg/dL — ABNORMAL HIGH (ref 70–99)
Potassium: 4 mmol/L (ref 3.5–5.1)
Potassium: 3.8 mmol/L (ref 3.5–5.1)
Sodium: 140 mmol/L (ref 135–145)
Sodium: 143 mmol/L (ref 135–145)

## 2019-12-19 LAB — CBC
HCT: 35.7 % — ABNORMAL LOW (ref 39.0–52.0)
HCT: 28.6 % — ABNORMAL LOW (ref 39.0–52.0)
HCT: 29.4 % — ABNORMAL LOW (ref 39.0–52.0)
Hemoglobin: 10.9 g/dL — ABNORMAL LOW (ref 13.0–17.0)
Hemoglobin: 8.8 g/dL — ABNORMAL LOW (ref 13.0–17.0)
Hemoglobin: 9.1 g/dL — ABNORMAL LOW (ref 13.0–17.0)
MCH: 26 pg (ref 26.0–34.0)
MCH: 26.3 pg (ref 26.0–34.0)
MCH: 26.4 pg (ref 26.0–34.0)
MCHC: 30.5 g/dL (ref 30.0–36.0)
MCHC: 30.8 g/dL (ref 30.0–36.0)
MCHC: 31 g/dL (ref 30.0–36.0)
MCV: 85 fL (ref 80.0–100.0)
MCV: 85.4 fL (ref 80.0–100.0)
MCV: 85.2 fL (ref 80.0–100.0)
Platelets: 182 10*3/uL (ref 150–400)
Platelets: 156 10*3/uL (ref 150–400)
Platelets: 184 10*3/uL (ref 150–400)
RBC: 4.2 MIL/uL — ABNORMAL LOW (ref 4.22–5.81)
RBC: 3.35 MIL/uL — ABNORMAL LOW (ref 4.22–5.81)
RBC: 3.45 MIL/uL — ABNORMAL LOW (ref 4.22–5.81)
RDW: 14.8 % (ref 11.5–15.5)
RDW: 14.8 % (ref 11.5–15.5)
RDW: 15 % (ref 11.5–15.5)
WBC: 7 10*3/uL (ref 4.0–10.5)
WBC: 21.9 10*3/uL — ABNORMAL HIGH (ref 4.0–10.5)
WBC: 20.6 10*3/uL — ABNORMAL HIGH (ref 4.0–10.5)
nRBC: 0 % (ref 0.0–0.2)
nRBC: 0 % (ref 0.0–0.2)
nRBC: 0 % (ref 0.0–0.2)

## 2019-12-19 LAB — ECHO INTRAOPERATIVE TEE
Height: 70 in
MV M vel: 4.8 m/s
MV Peak grad: 92.2 mmHg
Radius: 0.4 cm
S' Lateral: 6.71 cm
Weight: 2980.8 oz

## 2019-12-19 LAB — GLUCOSE, CAPILLARY
Glucose-Capillary: 136 mg/dL — ABNORMAL HIGH (ref 70–99)
Glucose-Capillary: 142 mg/dL — ABNORMAL HIGH (ref 70–99)
Glucose-Capillary: 143 mg/dL — ABNORMAL HIGH (ref 70–99)
Glucose-Capillary: 144 mg/dL — ABNORMAL HIGH (ref 70–99)
Glucose-Capillary: 146 mg/dL — ABNORMAL HIGH (ref 70–99)
Glucose-Capillary: 154 mg/dL — ABNORMAL HIGH (ref 70–99)
Glucose-Capillary: 159 mg/dL — ABNORMAL HIGH (ref 70–99)
Glucose-Capillary: 166 mg/dL — ABNORMAL HIGH (ref 70–99)
Glucose-Capillary: 186 mg/dL — ABNORMAL HIGH (ref 70–99)

## 2019-12-19 LAB — PROTIME-INR
INR: 1.4 — ABNORMAL HIGH (ref 0.8–1.2)
Prothrombin Time: 16.6 seconds — ABNORMAL HIGH (ref 11.4–15.2)

## 2019-12-19 LAB — HEMOGLOBIN AND HEMATOCRIT, BLOOD
HCT: 25.2 % — ABNORMAL LOW (ref 39.0–52.0)
Hemoglobin: 7.8 g/dL — ABNORMAL LOW (ref 13.0–17.0)

## 2019-12-19 LAB — APTT: aPTT: 38 seconds — ABNORMAL HIGH (ref 24–36)

## 2019-12-19 LAB — MAGNESIUM: Magnesium: 3 mg/dL — ABNORMAL HIGH (ref 1.7–2.4)

## 2019-12-19 LAB — HEPARIN LEVEL (UNFRACTIONATED): Heparin Unfractionated: 0.48 IU/mL (ref 0.30–0.70)

## 2019-12-19 LAB — PLATELET COUNT: Platelets: 110 10*3/uL — ABNORMAL LOW (ref 150–400)

## 2019-12-19 LAB — TACROLIMUS LEVEL: Tacrolimus (FK506) - LabCorp: 8.3 ng/mL (ref 2.0–20.0)

## 2019-12-19 LAB — POCT ACTIVATED CLOTTING TIME: Activated Clotting Time: 0 seconds

## 2019-12-19 SURGERY — CORONARY ARTERY BYPASS GRAFTING (CABG)
Anesthesia: General | Site: Chest

## 2019-12-19 MED ORDER — METOPROLOL TARTRATE 12.5 MG HALF TABLET
12.5000 mg | ORAL_TABLET | Freq: Two times a day (BID) | ORAL | Status: DC
  Administered 2019-12-20: 12.5 mg via ORAL
  Filled 2019-12-19 (×2): qty 1

## 2019-12-19 MED ORDER — SODIUM CHLORIDE 0.9 % IV SOLN: INTRAVENOUS | Status: DC

## 2019-12-19 MED ORDER — ALBUMIN HUMAN 5 % IV SOLN: INTRAVENOUS | Status: DC | PRN

## 2019-12-19 MED ORDER — PHENYLEPHRINE 40 MCG/ML (10ML) SYRINGE FOR IV PUSH (FOR BLOOD PRESSURE SUPPORT)
PREFILLED_SYRINGE | INTRAVENOUS | Status: DC | PRN
  Administered 2019-12-19: 80 ug via INTRAVENOUS
  Administered 2019-12-19 (×2): 120 ug via INTRAVENOUS
  Administered 2019-12-19: 80 ug via INTRAVENOUS

## 2019-12-19 MED ORDER — LACTATED RINGERS IV SOLN
INTRAVENOUS | Status: DC | PRN
Start: 2019-12-19 — End: 2019-12-19

## 2019-12-19 MED ORDER — STERILE WATER FOR INJECTION IJ SOLN
INTRAMUSCULAR | Status: AC
  Filled 2019-12-19: qty 10

## 2019-12-19 MED ORDER — NITROGLYCERIN IN D5W 200-5 MCG/ML-% IV SOLN: 0.0000 ug/min | INTRAVENOUS | Status: DC

## 2019-12-19 MED ORDER — LACTATED RINGERS IV SOLN: INTRAVENOUS | Status: DC

## 2019-12-19 MED ORDER — HEPARIN SODIUM (PORCINE) 1000 UNIT/ML IJ SOLN
INTRAMUSCULAR | Status: AC
  Filled 2019-12-19: qty 1

## 2019-12-19 MED ORDER — ALBUMIN HUMAN 5 % IV SOLN
250.0000 mL | INTRAVENOUS | Status: DC | PRN
  Administered 2019-12-19: 12.5 g via INTRAVENOUS

## 2019-12-19 MED ORDER — POTASSIUM CHLORIDE 10 MEQ/50ML IV SOLN: 10.0000 meq | INTRAVENOUS | Status: AC

## 2019-12-19 MED ORDER — PLASMA-LYTE 148 IV SOLN
INTRAVENOUS | Status: DC | PRN
  Administered 2019-12-19: 500 mL via INTRAVASCULAR

## 2019-12-19 MED ORDER — PANTOPRAZOLE SODIUM 40 MG PO TBEC
40.0000 mg | DELAYED_RELEASE_TABLET | Freq: Every day | ORAL | Status: DC
  Administered 2019-12-21 – 2020-01-01 (×13): 40 mg via ORAL
  Filled 2019-12-19 (×12): qty 1

## 2019-12-19 MED ORDER — SODIUM CHLORIDE 0.9% FLUSH
10.0000 mL | Freq: Two times a day (BID) | INTRAVENOUS | Status: DC
  Administered 2019-12-19 – 2019-12-22 (×7): 10 mL

## 2019-12-19 MED ORDER — MIDAZOLAM HCL (PF) 10 MG/2ML IJ SOLN
INTRAMUSCULAR | Status: AC
  Filled 2019-12-19: qty 2

## 2019-12-19 MED ORDER — DOCUSATE SODIUM 100 MG PO CAPS
200.0000 mg | ORAL_CAPSULE | Freq: Every day | ORAL | Status: DC
  Administered 2019-12-20 – 2020-01-01 (×10): 200 mg via ORAL
  Filled 2019-12-19 (×10): qty 2

## 2019-12-19 MED ORDER — FENTANYL CITRATE (PF) 250 MCG/5ML IJ SOLN
INTRAMUSCULAR | Status: AC
  Filled 2019-12-19: qty 20

## 2019-12-19 MED ORDER — SODIUM CHLORIDE (PF) 0.9 % IJ SOLN: OROMUCOSAL | Status: DC | PRN

## 2019-12-19 MED ORDER — MIDAZOLAM HCL 2 MG/2ML IJ SOLN: 2.0000 mg | INTRAMUSCULAR | Status: DC | PRN

## 2019-12-19 MED ORDER — CHLORHEXIDINE GLUCONATE 0.12% ORAL RINSE (MEDLINE KIT)
15.0000 mL | Freq: Two times a day (BID) | OROMUCOSAL | Status: DC
  Administered 2019-12-19 – 2020-01-01 (×10): 15 mL via OROMUCOSAL

## 2019-12-19 MED ORDER — PROTAMINE SULFATE 10 MG/ML IV SOLN
INTRAVENOUS | Status: DC | PRN
  Administered 2019-12-19 (×2): 25 mg via INTRAVENOUS
  Administered 2019-12-19: 190 mg via INTRAVENOUS

## 2019-12-19 MED ORDER — BUPIVACAINE HCL (PF) 0.5 % IJ SOLN
INTRAMUSCULAR | Status: DC | PRN
  Administered 2019-12-19: 30 mL

## 2019-12-19 MED ORDER — METOPROLOL TARTRATE 25 MG/10 ML ORAL SUSPENSION
12.5000 mg | Freq: Two times a day (BID) | ORAL | Status: DC
  Administered 2019-12-19: 12.5 mg
  Filled 2019-12-19: qty 5

## 2019-12-19 MED ORDER — CHLORHEXIDINE GLUCONATE CLOTH 2 % EX PADS
6.0000 | MEDICATED_PAD | Freq: Every day | CUTANEOUS | Status: DC
  Administered 2019-12-19 – 2019-12-29 (×10): 6 via TOPICAL

## 2019-12-19 MED ORDER — LACTATED RINGERS IV SOLN: 500.0000 mL | Freq: Once | INTRAVENOUS | Status: DC | PRN

## 2019-12-19 MED ORDER — VANCOMYCIN HCL IN DEXTROSE 1-5 GM/200ML-% IV SOLN
1000.0000 mg | Freq: Once | INTRAVENOUS | Status: AC
  Administered 2019-12-19: 1000 mg via INTRAVENOUS
  Filled 2019-12-19: qty 200

## 2019-12-19 MED ORDER — ACETAMINOPHEN 160 MG/5ML PO SOLN: 650.0000 mg | Freq: Once | ORAL | Status: AC

## 2019-12-19 MED ORDER — FENTANYL CITRATE (PF) 250 MCG/5ML IJ SOLN
INTRAMUSCULAR | Status: AC
  Filled 2019-12-19: qty 5

## 2019-12-19 MED ORDER — ETOMIDATE 2 MG/ML IV SOLN
INTRAVENOUS | Status: DC | PRN
  Administered 2019-12-19: 18 mg via INTRAVENOUS

## 2019-12-19 MED ORDER — TRAMADOL HCL 50 MG PO TABS
50.0000 mg | ORAL_TABLET | ORAL | Status: DC | PRN
  Administered 2019-12-20 – 2019-12-21 (×3): 100 mg via ORAL
  Filled 2019-12-19 (×2): qty 2
  Filled 2019-12-19: qty 1
  Filled 2019-12-19: qty 2

## 2019-12-19 MED ORDER — LACTATED RINGERS IV SOLN: INTRAVENOUS | Status: DC | PRN

## 2019-12-19 MED ORDER — BISACODYL 5 MG PO TBEC
10.0000 mg | DELAYED_RELEASE_TABLET | Freq: Every day | ORAL | Status: DC
  Administered 2019-12-20 – 2019-12-30 (×7): 10 mg via ORAL
  Filled 2019-12-19 (×8): qty 2

## 2019-12-19 MED ORDER — SODIUM CHLORIDE 0.9% FLUSH
3.0000 mL | Freq: Two times a day (BID) | INTRAVENOUS | Status: DC
  Administered 2019-12-20 – 2019-12-31 (×18): 3 mL via INTRAVENOUS

## 2019-12-19 MED ORDER — ACETAMINOPHEN 500 MG PO TABS
1000.0000 mg | ORAL_TABLET | Freq: Four times a day (QID) | ORAL | Status: DC
  Administered 2019-12-19 – 2019-12-22 (×7): 1000 mg via ORAL
  Filled 2019-12-19 (×7): qty 2

## 2019-12-19 MED ORDER — DEXMEDETOMIDINE HCL IN NACL 400 MCG/100ML IV SOLN
0.0000 ug/kg/h | INTRAVENOUS | Status: DC
  Administered 2019-12-19: 0.1 ug/kg/h via INTRAVENOUS
  Filled 2019-12-19: qty 100

## 2019-12-19 MED ORDER — DEXTROSE 50 % IV SOLN: 0.0000 mL | INTRAVENOUS | Status: DC | PRN

## 2019-12-19 MED ORDER — MORPHINE SULFATE (PF) 2 MG/ML IV SOLN
1.0000 mg | INTRAVENOUS | Status: DC | PRN
  Administered 2019-12-19 – 2019-12-20 (×3): 2 mg via INTRAVENOUS
  Administered 2019-12-20 (×2): 4 mg via INTRAVENOUS
  Administered 2019-12-21: 2 mg via INTRAVENOUS
  Filled 2019-12-19 (×2): qty 2
  Filled 2019-12-19 (×2): qty 1
  Filled 2019-12-19 (×2): qty 2

## 2019-12-19 MED ORDER — HEMOSTATIC AGENTS (NO CHARGE) OPTIME
TOPICAL | Status: DC | PRN
  Administered 2019-12-19: 1 via TOPICAL

## 2019-12-19 MED ORDER — SODIUM BICARBONATE 8.4 % IV SOLN
100.0000 meq | Freq: Once | INTRAVENOUS | Status: AC
  Administered 2019-12-19: 100 meq via INTRAVENOUS

## 2019-12-19 MED ORDER — ARTIFICIAL TEARS OPHTHALMIC OINT
TOPICAL_OINTMENT | OPHTHALMIC | Status: AC
  Filled 2019-12-19: qty 3.5

## 2019-12-19 MED ORDER — ETOMIDATE 2 MG/ML IV SOLN
INTRAVENOUS | Status: AC
  Filled 2019-12-19: qty 10

## 2019-12-19 MED ORDER — MIDAZOLAM HCL 2 MG/2ML IJ SOLN
INTRAMUSCULAR | Status: AC
  Filled 2019-12-19: qty 2

## 2019-12-19 MED ORDER — SODIUM CHLORIDE 0.9 % IV SOLN: INTRAVENOUS | Status: DC | PRN

## 2019-12-19 MED ORDER — OXYCODONE HCL 5 MG PO TABS
5.0000 mg | ORAL_TABLET | ORAL | Status: DC | PRN
  Administered 2019-12-20 – 2019-12-22 (×7): 10 mg via ORAL
  Administered 2019-12-26: 5 mg via ORAL
  Administered 2019-12-27 (×2): 10 mg via ORAL
  Administered 2019-12-27: 5 mg via ORAL
  Administered 2019-12-27 – 2019-12-28 (×3): 10 mg via ORAL
  Filled 2019-12-19: qty 1
  Filled 2019-12-19 (×4): qty 2
  Filled 2019-12-19 (×2): qty 1
  Filled 2019-12-19 (×8): qty 2

## 2019-12-19 MED ORDER — SODIUM CHLORIDE 0.9 % IV SOLN
20.0000 ug | Freq: Once | INTRAVENOUS | Status: AC
  Administered 2019-12-19: 20 ug via INTRAVENOUS
  Filled 2019-12-19: qty 5

## 2019-12-19 MED ORDER — PROTAMINE SULFATE 10 MG/ML IV SOLN
INTRAVENOUS | Status: AC
  Filled 2019-12-19: qty 25

## 2019-12-19 MED ORDER — NOREPINEPHRINE 4 MG/250ML-% IV SOLN
0.0000 ug/min | INTRAVENOUS | Status: DC
  Filled 2019-12-19: qty 250

## 2019-12-19 MED ORDER — FAMOTIDINE IN NACL 20-0.9 MG/50ML-% IV SOLN
INTRAVENOUS | Status: AC
  Filled 2019-12-19: qty 50

## 2019-12-19 MED ORDER — FENTANYL CITRATE (PF) 250 MCG/5ML IJ SOLN
INTRAMUSCULAR | Status: DC | PRN
  Administered 2019-12-19: 200 ug via INTRAVENOUS
  Administered 2019-12-19: 250 ug via INTRAVENOUS
  Administered 2019-12-19: 150 ug via INTRAVENOUS
  Administered 2019-12-19: 100 ug via INTRAVENOUS
  Administered 2019-12-19: 250 ug via INTRAVENOUS
  Administered 2019-12-19: 50 ug via INTRAVENOUS

## 2019-12-19 MED ORDER — VANCOMYCIN HCL 1000 MG IV SOLR
INTRAVENOUS | Status: AC
  Filled 2019-12-19: qty 3000

## 2019-12-19 MED ORDER — SODIUM CHLORIDE 0.9% FLUSH: 3.0000 mL | INTRAVENOUS | Status: DC | PRN

## 2019-12-19 MED ORDER — MIDAZOLAM HCL 5 MG/5ML IJ SOLN
INTRAMUSCULAR | Status: DC | PRN
  Administered 2019-12-19 (×5): 2 mg via INTRAVENOUS
  Administered 2019-12-19 (×2): 1 mg via INTRAVENOUS

## 2019-12-19 MED ORDER — ONDANSETRON HCL 4 MG/2ML IJ SOLN
4.0000 mg | Freq: Four times a day (QID) | INTRAMUSCULAR | Status: DC | PRN
  Administered 2019-12-19 – 2019-12-31 (×14): 4 mg via INTRAVENOUS
  Filled 2019-12-19 (×15): qty 2

## 2019-12-19 MED ORDER — BISACODYL 10 MG RE SUPP: 10.0000 mg | Freq: Every day | RECTAL | Status: DC

## 2019-12-19 MED ORDER — EPINEPHRINE HCL 5 MG/250ML IV SOLN IN NS
5.0000 ug/min | INTRAVENOUS | Status: DC
  Administered 2019-12-20: 5 ug/min via INTRAVENOUS
  Filled 2019-12-19: qty 250

## 2019-12-19 MED ORDER — ASPIRIN EC 325 MG PO TBEC
325.0000 mg | DELAYED_RELEASE_TABLET | Freq: Every day | ORAL | Status: DC
  Administered 2019-12-20 – 2019-12-22 (×3): 325 mg via ORAL
  Filled 2019-12-19 (×3): qty 1

## 2019-12-19 MED ORDER — CHLORHEXIDINE GLUCONATE CLOTH 2 % EX PADS: 6.0000 | MEDICATED_PAD | Freq: Every day | CUTANEOUS | Status: DC

## 2019-12-19 MED ORDER — ROCURONIUM BROMIDE 10 MG/ML (PF) SYRINGE
PREFILLED_SYRINGE | INTRAVENOUS | Status: DC | PRN
  Administered 2019-12-19: 100 mg via INTRAVENOUS
  Administered 2019-12-19 (×2): 50 mg via INTRAVENOUS

## 2019-12-19 MED ORDER — FAMOTIDINE IN NACL 20-0.9 MG/50ML-% IV SOLN
20.0000 mg | Freq: Two times a day (BID) | INTRAVENOUS | Status: AC
  Administered 2019-12-19 (×2): 20 mg via INTRAVENOUS
  Filled 2019-12-19: qty 50

## 2019-12-19 MED ORDER — PROPOFOL 10 MG/ML IV BOLUS
INTRAVENOUS | Status: AC
  Filled 2019-12-19: qty 20

## 2019-12-19 MED ORDER — SODIUM CHLORIDE 0.9% FLUSH: 10.0000 mL | INTRAVENOUS | Status: DC | PRN

## 2019-12-19 MED ORDER — BUPIVACAINE HCL (PF) 0.5 % IJ SOLN
INTRAMUSCULAR | Status: AC
  Filled 2019-12-19: qty 30

## 2019-12-19 MED ORDER — SODIUM CHLORIDE 0.9 % IV SOLN
1.5000 g | Freq: Two times a day (BID) | INTRAVENOUS | Status: AC
  Administered 2019-12-19 – 2019-12-21 (×4): 1.5 g via INTRAVENOUS
  Filled 2019-12-19 (×4): qty 1.5

## 2019-12-19 MED ORDER — INSULIN REGULAR(HUMAN) IN NACL 100-0.9 UT/100ML-% IV SOLN: INTRAVENOUS | Status: DC

## 2019-12-19 MED ORDER — BUPIVACAINE LIPOSOME 1.3 % IJ SUSP
INTRAMUSCULAR | Status: DC | PRN
  Administered 2019-12-19 (×2): 20 mL

## 2019-12-19 MED ORDER — ACETAMINOPHEN 160 MG/5ML PO SOLN: 1000.0000 mg | Freq: Four times a day (QID) | ORAL | Status: DC

## 2019-12-19 MED ORDER — PHENYLEPHRINE HCL-NACL 20-0.9 MG/250ML-% IV SOLN
0.0000 ug/min | INTRAVENOUS | Status: DC
  Administered 2019-12-20 – 2019-12-22 (×2): 20 ug/min via INTRAVENOUS
  Filled 2019-12-19 (×2): qty 250

## 2019-12-19 MED ORDER — SODIUM BICARBONATE 8.4 % IV SOLN
INTRAVENOUS | Status: AC
  Filled 2019-12-19: qty 100

## 2019-12-19 MED ORDER — MAGNESIUM SULFATE 4 GM/100ML IV SOLN
INTRAVENOUS | Status: AC
  Administered 2019-12-19: 4 g
  Filled 2019-12-19: qty 100

## 2019-12-19 MED ORDER — HEPARIN SODIUM (PORCINE) 1000 UNIT/ML IJ SOLN
INTRAMUSCULAR | Status: DC | PRN
  Administered 2019-12-19: 26000 [IU] via INTRAVENOUS

## 2019-12-19 MED ORDER — VANCOMYCIN HCL 1000 MG IV SOLR
INTRAVENOUS | Status: DC | PRN
  Administered 2019-12-19: 3000 g

## 2019-12-19 MED ORDER — SODIUM CHLORIDE 0.45 % IV SOLN: INTRAVENOUS | Status: DC | PRN

## 2019-12-19 MED ORDER — 0.9 % SODIUM CHLORIDE (POUR BTL) OPTIME
TOPICAL | Status: DC | PRN
  Administered 2019-12-19: 6000 mL

## 2019-12-19 MED ORDER — ASPIRIN 81 MG PO CHEW: 324.0000 mg | CHEWABLE_TABLET | Freq: Every day | ORAL | Status: DC

## 2019-12-19 MED ORDER — CHLORHEXIDINE GLUCONATE 0.12 % MT SOLN
15.0000 mL | OROMUCOSAL | Status: AC
  Administered 2019-12-19: 15 mL via OROMUCOSAL

## 2019-12-19 MED ORDER — ORAL CARE MOUTH RINSE
15.0000 mL | OROMUCOSAL | Status: DC
  Administered 2019-12-19 (×3): 15 mL via OROMUCOSAL

## 2019-12-19 MED ORDER — ROCURONIUM BROMIDE 10 MG/ML (PF) SYRINGE
PREFILLED_SYRINGE | INTRAVENOUS | Status: AC
  Filled 2019-12-19: qty 10

## 2019-12-19 MED ORDER — MAGNESIUM SULFATE 4 GM/100ML IV SOLN
4.0000 g | Freq: Once | INTRAVENOUS | Status: AC
  Administered 2019-12-19: 4 g via INTRAVENOUS

## 2019-12-19 MED ORDER — ACETAMINOPHEN 650 MG RE SUPP
650.0000 mg | Freq: Once | RECTAL | Status: AC
  Administered 2019-12-19: 650 mg via RECTAL

## 2019-12-19 MED ORDER — SODIUM CHLORIDE 0.9 % IV SOLN: 250.0000 mL | INTRAVENOUS | Status: DC

## 2019-12-19 MED ORDER — LIDOCAINE 2% (20 MG/ML) 5 ML SYRINGE
INTRAMUSCULAR | Status: DC | PRN
  Administered 2019-12-19: 80 mg via INTRAVENOUS

## 2019-12-19 MED ORDER — MILRINONE LACTATE IN DEXTROSE 20-5 MG/100ML-% IV SOLN
0.5000 ug/kg/min | INTRAVENOUS | Status: DC
  Administered 2019-12-19: 0.125 ug/kg/min via INTRAVENOUS
  Administered 2019-12-20 – 2019-12-22 (×5): 0.5 ug/kg/min via INTRAVENOUS
  Filled 2019-12-19 (×5): qty 100

## 2019-12-19 MED ORDER — METOPROLOL TARTRATE 5 MG/5ML IV SOLN
2.5000 mg | INTRAVENOUS | Status: DC | PRN
  Administered 2019-12-23 – 2019-12-24 (×2): 2.5 mg via INTRAVENOUS
  Filled 2019-12-19 (×2): qty 5

## 2019-12-19 SURGICAL SUPPLY — 160 items
ADAPTER CARDIO PERF ANTE/RETRO (ADAPTER) ×5 IMPLANT
ADH SKN CLS APL DERMABOND .7 (GAUZE/BANDAGES/DRESSINGS) ×3
ADPR PRFSN 84XANTGRD RTRGD (ADAPTER) ×3
ADPR TBG 2 MALE LL ART (MISCELLANEOUS) ×3
ANCHOR CATH FOLEY SECURE (MISCELLANEOUS) ×6 IMPLANT
APL SRG 7X2 LUM MLBL SLNT (VASCULAR PRODUCTS) ×3
APPLICATOR TIP COSEAL (VASCULAR PRODUCTS) ×3 IMPLANT
APPLIER CLIP 9.375 SM OPEN (CLIP)
APR CLP SM 9.3 20 MLT OPN (CLIP)
ATRICLIP EXCLUSION VLAA 50 (Miscellaneous) ×4 IMPLANT
ATRICLIP EXCLUSION VLAA 50MM (Miscellaneous) ×1 IMPLANT
BAG DECANTER FOR FLEXI CONT (MISCELLANEOUS) ×5 IMPLANT
BATTERY MAXDRIVER (MISCELLANEOUS) ×3 IMPLANT
BLADE 15 SAFETY STRL DISP (BLADE) ×3 IMPLANT
BLADE CLIPPER SURG (BLADE) ×5 IMPLANT
BLADE STERNUM SYSTEM 6 (BLADE) ×5 IMPLANT
BLADE SURG 15 STRL LF DISP TIS (BLADE) ×3 IMPLANT
BLADE SURG 15 STRL SS (BLADE) ×5
BNDG CMPR MED 10X6 ELC LF (GAUZE/BANDAGES/DRESSINGS) ×3
BNDG ELASTIC 4X5.8 VLCR STR LF (GAUZE/BANDAGES/DRESSINGS) ×5 IMPLANT
BNDG ELASTIC 6X10 VLCR STRL LF (GAUZE/BANDAGES/DRESSINGS) ×3 IMPLANT
BNDG ELASTIC 6X5.8 VLCR STR LF (GAUZE/BANDAGES/DRESSINGS) ×5 IMPLANT
BNDG GAUZE ELAST 4 BULKY (GAUZE/BANDAGES/DRESSINGS) ×5 IMPLANT
CANISTER SUCT 3000ML PPV (MISCELLANEOUS) ×5 IMPLANT
CANN PRFSN 3/8XCNCT ST RT ANG (MISCELLANEOUS) ×6
CANN PRFSN 3/8XRT ANG TPR 14 (MISCELLANEOUS) ×3
CANNULA NON VENT 20FR 12 (CANNULA) ×3 IMPLANT
CANNULA PRFSN 3/8XCNCT RT ANG (MISCELLANEOUS) ×2 IMPLANT
CANNULA PRFSN 3/8XRT ANG TPR14 (MISCELLANEOUS) ×1 IMPLANT
CANNULA VEN MTL TIP RT (MISCELLANEOUS) ×15
CATH CPB KIT HENDRICKSON (MISCELLANEOUS) ×5 IMPLANT
CATH DIAG EXPO 6F AL1 (CATHETERS) ×2 IMPLANT
CATH INFINITI 6F MPB2 (CATHETERS) ×2 IMPLANT
CATH RETROPLEGIA CORONARY 14FR (CATHETERS) ×3 IMPLANT
CATH ROBINSON RED A/P 18FR (CATHETERS) ×13 IMPLANT
CLAMP ISOLATOR SYNERGY LG (MISCELLANEOUS) ×3 IMPLANT
CLIP APPLIE 9.375 SM OPEN (CLIP) ×2 IMPLANT
CLIP LIGATING EXTRA MED SLVR (CLIP) ×5 IMPLANT
CLIP RETRACTION 3.0MM CORONARY (MISCELLANEOUS) ×5 IMPLANT
CLIP VESOCCLUDE MED 24/CT (CLIP) ×3 IMPLANT
CLIP VESOCCLUDE SM WIDE 24/CT (CLIP) ×9 IMPLANT
CONN 1/2X1/2X1/2  BEN (MISCELLANEOUS) ×5
CONN 1/2X1/2X1/2 BEN (MISCELLANEOUS) ×1 IMPLANT
CONN 3/8X1/2 ST GISH (MISCELLANEOUS) ×6 IMPLANT
CONN ST 1/4X3/8  BEN (MISCELLANEOUS) ×10
CONN ST 1/4X3/8 BEN (MISCELLANEOUS) ×2 IMPLANT
COVER MAYO STAND STRL (DRAPES) ×5 IMPLANT
CUFF TOURN SGL QUICK 18X4 (TOURNIQUET CUFF) IMPLANT
CUFF TOURN SGL QUICK 24 (TOURNIQUET CUFF)
CUFF TRNQT CYL 24X4X16.5-23 (TOURNIQUET CUFF) IMPLANT
DERMABOND ADVANCED (GAUZE/BANDAGES/DRESSINGS) ×2
DERMABOND ADVANCED .7 DNX12 (GAUZE/BANDAGES/DRESSINGS) ×3 IMPLANT
DEVICE EXCLUSIN ATRCLP VLAA 50 (Miscellaneous) ×1 IMPLANT
DRAIN CHANNEL 28F RND 3/8 FF (WOUND CARE) ×15 IMPLANT
DRAPE C-ARM 42X72 X-RAY (DRAPES) ×4 IMPLANT
DRAPE CARDIOVASCULAR INCISE (DRAPES) ×5
DRAPE CV SPLIT W-CLR ANES SCRN (DRAPES) ×2 IMPLANT
DRAPE EXTREMITY T 121X128X90 (DISPOSABLE) ×2 IMPLANT
DRAPE HALF SHEET 40X57 (DRAPES) ×8 IMPLANT
DRAPE PERI GROIN 82X75IN TIB (DRAPES) ×2 IMPLANT
DRAPE SLUSH/WARMER DISC (DRAPES) ×5 IMPLANT
DRAPE SRG 135X102X78XABS (DRAPES) ×3 IMPLANT
DRSG AQUACEL AG ADV 3.5X14 (GAUZE/BANDAGES/DRESSINGS) ×5 IMPLANT
ELECT CAUTERY BLADE 6.4 (BLADE) ×5 IMPLANT
ELECT DEFIB STAT PADZ 8900 400 (MISCELLANEOUS) ×3 IMPLANT
ELECT REM PT RETURN 9FT ADLT (ELECTROSURGICAL) ×10
ELECTRODE REM PT RTRN 9FT ADLT (ELECTROSURGICAL) ×6 IMPLANT
FELT TEFLON 1X6 (MISCELLANEOUS) ×10 IMPLANT
GAUZE SPONGE 4X4 12PLY STRL (GAUZE/BANDAGES/DRESSINGS) ×7 IMPLANT
GAUZE SPONGE 4X4 12PLY STRL LF (GAUZE/BANDAGES/DRESSINGS) ×3 IMPLANT
GEL ULTRASOUND 20GR AQUASONIC (MISCELLANEOUS) ×2 IMPLANT
GLOVE BIO SURGEON STRL SZ7.5 (GLOVE) ×15 IMPLANT
GLOVE BIOGEL PI IND STRL 6 (GLOVE) ×6 IMPLANT
GLOVE BIOGEL PI IND STRL 8.5 (GLOVE) ×1 IMPLANT
GLOVE BIOGEL PI INDICATOR 6 (GLOVE) ×12
GLOVE BIOGEL PI INDICATOR 8.5 (GLOVE) ×2
GLOVE NEODERM STRL 7.5  LF PF (GLOVE) ×9
GLOVE NEODERM STRL 7.5 LF PF (GLOVE) ×9 IMPLANT
GLOVE SURG NEODERM 7.5  LF PF (GLOVE) ×6
GOWN STRL REUS W/ TWL LRG LVL3 (GOWN DISPOSABLE) ×22 IMPLANT
GOWN STRL REUS W/TWL LRG LVL3 (GOWN DISPOSABLE) ×70
HEMOSTAT POWDER SURGIFOAM 1G (HEMOSTASIS) ×10 IMPLANT
INSERT FOGARTY SM (MISCELLANEOUS) ×2 IMPLANT
INSERT FOGARTY XLG (MISCELLANEOUS) ×3 IMPLANT
IV ADAPTER SYR DOUBLE MALE LL (MISCELLANEOUS) ×3 IMPLANT
KIT BASIN OR (CUSTOM PROCEDURE TRAY) ×5 IMPLANT
KIT SUCTION CATH 14FR (SUCTIONS) ×5 IMPLANT
KIT TURNOVER KIT B (KITS) ×5 IMPLANT
KIT VASOVIEW HEMOPRO 2 VH 4000 (KITS) ×5 IMPLANT
LEAD PACING MYOCARDI (MISCELLANEOUS) ×3 IMPLANT
LIGACLIP SM TITANIUM (CLIP) ×5 IMPLANT
LOOP VESSEL MINI RED (MISCELLANEOUS) ×2 IMPLANT
MARKER GRAFT CORONARY BYPASS (MISCELLANEOUS) ×15 IMPLANT
NDL 18GX1X1/2 (RX/OR ONLY) (NEEDLE) ×2 IMPLANT
NEEDLE 18GX1X1/2 (RX/OR ONLY) (NEEDLE) ×5 IMPLANT
NS IRRIG 1000ML POUR BTL (IV SOLUTION) ×25 IMPLANT
PACK E OPEN HEART (SUTURE) ×5 IMPLANT
PACK OPEN HEART (CUSTOM PROCEDURE TRAY) ×5 IMPLANT
PACK PERIPHERAL VASCULAR (CUSTOM PROCEDURE TRAY) ×2 IMPLANT
PACK SPY-PHI (KITS) ×3 IMPLANT
PAD ARMBOARD 7.5X6 YLW CONV (MISCELLANEOUS) ×10 IMPLANT
PAD ELECT DEFIB RADIOL ZOLL (MISCELLANEOUS) ×5 IMPLANT
PENCIL BUTTON HOLSTER BLD 10FT (ELECTRODE) ×5 IMPLANT
PLATE STERNAL 2.3X208 14H 2-PK (Plate) ×3 IMPLANT
POSITIONER HEAD DONUT 9IN (MISCELLANEOUS) ×5 IMPLANT
POWDER SURGICEL 3.0 GRAM (HEMOSTASIS) ×3 IMPLANT
PROBE CRYO2-ABLATION MALLABLE (MISCELLANEOUS) ×3 IMPLANT
PUMP SET IMPELLA 5.5 US (CATHETERS) ×2 IMPLANT
PUNCH AORTIC ROTATE 4.5MM 8IN (MISCELLANEOUS) ×3 IMPLANT
SCREW LOCKING TI 2.3X11MM (Screw) ×18 IMPLANT
SCREW LOCKING TI 2.3X13MM (Screw) ×6 IMPLANT
SCREW STERNAL LOCK 2.3MM (Screw) ×12 IMPLANT
SEALANT SURG COSEAL 8ML (VASCULAR PRODUCTS) ×5 IMPLANT
SET CARDIOPLEGIA MPS 5001102 (MISCELLANEOUS) ×3 IMPLANT
SHEARS HARMONIC 9CM CVD (BLADE) IMPLANT
STAPLER VISISTAT 35W (STAPLE) IMPLANT
SUPPORT HEART JANKE-BARRON (MISCELLANEOUS) ×5 IMPLANT
SUT BONE WAX W31G (SUTURE) ×5 IMPLANT
SUT ETHIBOND X763 2 0 SH 1 (SUTURE) ×6 IMPLANT
SUT ETHILON 3 0 PS 1 (SUTURE) ×10 IMPLANT
SUT MNCRL AB 3-0 PS2 18 (SUTURE) ×16 IMPLANT
SUT PDS AB 1 CTX 36 (SUTURE) ×10 IMPLANT
SUT PROLENE 3 0 SH DA (SUTURE) ×14 IMPLANT
SUT PROLENE 4 0 RB 1 (SUTURE) ×10
SUT PROLENE 4 0 SH DA (SUTURE) ×3 IMPLANT
SUT PROLENE 4-0 RB1 .5 CRCL 36 (SUTURE) ×2 IMPLANT
SUT PROLENE 5 0 C 1 36 (SUTURE) ×13 IMPLANT
SUT PROLENE 6 0 C 1 30 (SUTURE) ×27 IMPLANT
SUT PROLENE 7 0 BV 1 (SUTURE) ×3 IMPLANT
SUT PROLENE 7 0 BV1 MDA (SUTURE) ×3 IMPLANT
SUT PROLENE 8 0 BV175 6 (SUTURE) IMPLANT
SUT PROLENE BLUE 7 0 (SUTURE) ×5 IMPLANT
SUT SILK  1 MH (SUTURE) ×20
SUT SILK 1 MH (SUTURE) ×12 IMPLANT
SUT SILK 1 TIES 10X30 (SUTURE) ×5 IMPLANT
SUT SILK 2 0 SH CR/8 (SUTURE) IMPLANT
SUT SILK 3 0 SH CR/8 (SUTURE) IMPLANT
SUT STEEL 6MS V (SUTURE) ×5 IMPLANT
SUT STEEL SZ 6 DBL 3X14 BALL (SUTURE) ×5 IMPLANT
SUT VIC AB 2-0 CT1 27 (SUTURE) ×5
SUT VIC AB 2-0 CT1 TAPERPNT 27 (SUTURE) ×1 IMPLANT
SUT VIC AB 2-0 CTX 27 (SUTURE) IMPLANT
SUT VIC AB 3-0 SH 27 (SUTURE)
SUT VIC AB 3-0 SH 27X BRD (SUTURE) IMPLANT
SUT VIC AB 3-0 X1 27 (SUTURE) ×3 IMPLANT
SYR 10ML LL (SYRINGE) IMPLANT
SYR 30ML LL (SYRINGE) ×5 IMPLANT
SYR 3ML LL SCALE MARK (SYRINGE) ×2 IMPLANT
SYR 50ML SLIP (SYRINGE) IMPLANT
SYSTEM SAHARA CHEST DRAIN ATS (WOUND CARE) ×5 IMPLANT
TAPE CLOTH SURG 4X10 WHT LF (GAUZE/BANDAGES/DRESSINGS) ×3 IMPLANT
TAPE PAPER 2X10 WHT MICROPORE (GAUZE/BANDAGES/DRESSINGS) ×3 IMPLANT
TOWEL GREEN STERILE (TOWEL DISPOSABLE) ×5 IMPLANT
TOWEL GREEN STERILE FF (TOWEL DISPOSABLE) ×5 IMPLANT
TRAY FOLEY SLVR 16FR TEMP STAT (SET/KITS/TRAYS/PACK) ×5 IMPLANT
TUBING ART PRESS 48 MALE/FEM (TUBING) ×6 IMPLANT
TUBING LAP HI FLOW INSUFFLATIO (TUBING) ×5 IMPLANT
UNDERPAD 30X36 HEAVY ABSORB (UNDERPADS AND DIAPERS) ×5 IMPLANT
WATER STERILE IRR 1000ML POUR (IV SOLUTION) ×10 IMPLANT
WATER STERILE IRR 1000ML UROMA (IV SOLUTION) IMPLANT

## 2019-12-19 NOTE — Progress Notes (Signed)
TCTS BRIEF SICU PROGRESS NOTE  Day of Surgery  S/P Procedure(s) (LRB): CORONARY ARTERY BYPASS GRAFTING (CABG) TIMES  X 5,   USING BILATERAL MAMMARY ARTERY AND RIGHT LEG GREATER SAPHENOUS VEIN HARVESTED ENDOSCOPICALLY (N/A) TRANSESOPHAGEAL ECHOCARDIOGRAM (TEE) (N/A) INDOCYANINE GREEN FLUORESCENCE IMAGING (ICG) (N/A) MAZE (N/A) CLIPPING OF ATRIAL APPENDAGE USING ATRICURE CLIP SIZE 50MM (N/A)   Sedated on vent AV paced w/ stable hemodynamics on Epi, levophed and milrinone drips O2 sats 100% on PEEP 8 and FiO2 80% Chest tube output low UOP adequate  Plan: Continue current plan  Rexene Alberts, MD 12/19/2019 5:15 PM

## 2019-12-19 NOTE — Transfer of Care (Signed)
Immediate Anesthesia Transfer of Care Note  Patient: Joseph Greer  Procedure(s) Performed: CORONARY ARTERY BYPASS GRAFTING (CABG) TIMES  X 5,   USING BILATERAL MAMMARY ARTERY AND RIGHT LEG GREATER SAPHENOUS VEIN HARVESTED ENDOSCOPICALLY (N/A Chest) TRANSESOPHAGEAL ECHOCARDIOGRAM (TEE) (N/A ) INDOCYANINE GREEN FLUORESCENCE IMAGING (ICG) (N/A ) MAZE (N/A Chest) CLIPPING OF ATRIAL APPENDAGE USING ATRICURE CLIP SIZE 50MM (N/A Chest)  Patient Location: ICU  Anesthesia Type:General  Level of Consciousness: Patient remains intubated per anesthesia plan  Airway & Oxygen Therapy: Patient remains intubated per anesthesia plan and Patient placed on Ventilator (see vital sign flow sheet for setting)  Post-op Assessment: Report given to RN  Post vital signs: Reviewed and stable  Last Vitals:  Vitals Value Taken Time  BP    Temp 36.5 C 12/19/19 1502  Pulse 91 12/19/19 1502  Resp 14 12/19/19 1502  SpO2 96 % 12/19/19 1502  Vitals shown include unvalidated device data.  Last Pain:  Vitals:   12/19/19 0511  TempSrc: Oral  PainSc:       Patients Stated Pain Goal: 0 (34/03/70 9643)  Complications: No complications documented.

## 2019-12-19 NOTE — Anesthesia Procedure Notes (Signed)
Arterial Line Insertion Start/End7/16/2021 8:10 AM, 12/19/2019 8:15 AM Performed by: Barrington Ellison, CRNA, CRNA  Patient location: OR. Patient sedated Left, radial was placed Catheter size: 20 G Hand hygiene performed  and maximum sterile barriers used  Allen's test indicative of satisfactory collateral circulation Attempts: 1 Procedure performed without using ultrasound guided technique. Following insertion, dressing applied and Biopatch. Post procedure assessment: normal  Patient tolerated the procedure well with no immediate complications.

## 2019-12-19 NOTE — Anesthesia Procedure Notes (Signed)
Arterial Line Insertion Start/End7/16/2021 6:50 AM, 12/19/2019 6:53 AM Performed by: Barrington Ellison, CRNA, CRNA  Preanesthetic checklist: patient identified, risks and benefits discussed and pre-op evaluation Lidocaine 1% used for infiltration Right, radial was placed Catheter size: 20 G Hand hygiene performed  and maximum sterile barriers used  Allen's test indicative of satisfactory collateral circulation Attempts: 1 Procedure performed without using ultrasound guided technique. Following insertion, dressing applied and Biopatch. Post procedure assessment: normal  Patient tolerated the procedure well with no immediate complications.

## 2019-12-19 NOTE — Brief Op Note (Signed)
12/11/2019 - 12/19/2019  1:07 PM  PATIENT:  Joseph Greer  64 y.o. male  PRE-OPERATIVE DIAGNOSIS:  Coronary Artery Disease Congestive Heart Failure  POST-OPERATIVE DIAGNOSIS:  Coronary Artery Disease Congestive Heart Failure  PROCEDURE:  Procedure(s) with comments: CORONARY ARTERY BYPASS GRAFTING (CABG) TIMES      USING BILATERAL MAMMARY ARTERY AND RIGHT LEG GREATER SAPHENOUS VEIN HARVESTING (N/A) - BILATERAL INTERNAL MAMMARY ARTERY PLACEMENT OF IMPELLA LEFT VENTRICULAR ASSIST DEVICE (N/A) TRANSESOPHAGEAL ECHOCARDIOGRAM (TEE) (N/A) INDOCYANINE GREEN FLUORESCENCE IMAGING (ICG) (N/A) MAZE (N/A) CLIPPING OF ATRIAL APPENDAGE USING ATRICURE CLIP SIZE 50MM (N/A) CABG x 5: LIMA-LAD; RIMA-RAMUS; SEQ SVG-PD-PL; SVG-DIAG  SURGEON:  Surgeon(s) and Role:    * Akirra Lacerda, Glenice Bow, MD - Primary  PHYSICIAN ASSISTANT: WAYNE GOLD PA-C  ASSISTANTS: STAFF   ANESTHESIA:   general  EBL: per anes  BLOOD ADMINISTERED:none  DRAINS: BILAT PLEURAL AND MEDIASTINAL DRAINS   LOCAL MEDICATIONS USED:   EXPAREL  SPECIMEN:  No Specimen  DISPOSITION OF SPECIMEN:  N/A  COUNTS:  YES  TOURNIQUET:  * No tourniquets in log *  DICTATION: .Dragon Dictation  PLAN OF CARE: Admit to inpatient   PATIENT DISPOSITION:  ICU - intubated and hemodynamically stable.   Delay start of Pharmacological VTE agent (>24hrs) due to surgical blood loss or risk of bleeding: yes  COMPLICATIONS: NO KNOWN

## 2019-12-19 NOTE — Procedures (Signed)
Extubation Procedure Note  Patient Details:   Name: Joseph Greer DOB: 01-03-1956 MRN: [id number]   Airway Documentation:    Vent end date: 12/19/19 Vent end time: 2223   Evaluation  O2 sats: stable throughout Complications: No apparent complications Patient did tolerate procedure well. Bilateral Breath Sounds: Clear, Diminished   Yes   NIF -24 VC 780 ml IS was 522ml. suctioned mouth and airway. Leak present. No stridor noted. Pt tolerated well.   Angelique Holm 12/19/2019, 10:31 PM

## 2019-12-19 NOTE — Anesthesia Postprocedure Evaluation (Signed)
Anesthesia Post Note  Patient: Joseph Greer  Procedure(s) Performed: CORONARY ARTERY BYPASS GRAFTING (CABG) TIMES  X 5,   USING BILATERAL MAMMARY ARTERY AND RIGHT LEG GREATER SAPHENOUS VEIN HARVESTED ENDOSCOPICALLY (N/A Chest) TRANSESOPHAGEAL ECHOCARDIOGRAM (TEE) (N/A ) INDOCYANINE GREEN FLUORESCENCE IMAGING (ICG) (N/A ) MAZE (N/A Chest) CLIPPING OF ATRIAL APPENDAGE USING ATRICURE CLIP SIZE 50MM (N/A Chest)     Patient location during evaluation: PACU Anesthesia Type: General Level of consciousness: awake and alert, awake and oriented Pain management: pain level controlled Vital Signs Assessment: post-procedure vital signs reviewed and stable Respiratory status: spontaneous breathing, nonlabored ventilation and respiratory function stable Cardiovascular status: blood pressure returned to baseline and stable Postop Assessment: no apparent nausea or vomiting Anesthetic complications: no   No complications documented.  Last Vitals:  Vitals:   12/19/19 1730 12/19/19 1745  BP: 107/61 135/67  Pulse: 87 91  Resp: 19 18  Temp: 36.6 C 36.6 C  SpO2: 100% 100%    Last Pain:  Vitals:   12/19/19 1500  TempSrc: Core  PainSc:                  Catalina Gravel

## 2019-12-19 NOTE — Anesthesia Procedure Notes (Signed)
Central Venous Catheter Insertion Performed by: Effie Berkshire, MD, anesthesiologist Start/End7/16/2021 6:40 AM, 12/19/2019 6:50 AM Patient location: Pre-op. Preanesthetic checklist: patient identified, IV checked, site marked, risks and benefits discussed, surgical consent, monitors and equipment checked, pre-op evaluation, timeout performed and anesthesia consent Position: Trendelenburg Lidocaine 1% used for infiltration and patient sedated Hand hygiene performed , maximum sterile barriers used  and Seldinger technique used Catheter size: 9 Fr Total catheter length 10. Central line was placed.MAC introducer Swan type:thermodilution PA Cath depth:50 Procedure performed using ultrasound guided technique. Ultrasound Notes:anatomy identified, needle tip was noted to be adjacent to the nerve/plexus identified, no ultrasound evidence of intravascular and/or intraneural injection and image(s) printed for medical record Attempts: 1 Following insertion, line sutured and dressing applied. Post procedure assessment: blood return through all ports, free fluid flow and no air  Patient tolerated the procedure well with no immediate complications.

## 2019-12-19 NOTE — H&P (Signed)
History and Physical Interval Note:  12/19/2019 7:10 AM  Marykay Lex  has presented today for surgery, with the diagnosis of CAD CHF.  The various methods of treatment have been discussed with the patient and family. After consideration of risks, benefits and other options for treatment, the patient has consented to  Procedure(s) with comments: CORONARY ARTERY BYPASS GRAFTING (CABG) (N/A) - BILATERAL IMA RADIAL ARTERY HARVEST (Left) PLACEMENT OF IMPELLA LEFT VENTRICULAR ASSIST DEVICE (N/A) TRANSESOPHAGEAL ECHOCARDIOGRAM (TEE) (N/A) INDOCYANINE GREEN FLUORESCENCE IMAGING (ICG) (N/A) as a surgical intervention.  The patient's history has been reviewed, patient examined, no change in status, stable for surgery.  I have reviewed the patient's chart and labs.  Questions were answered to the patient's satisfaction.     Wonda Olds

## 2019-12-19 NOTE — Anesthesia Procedure Notes (Signed)
Procedure Name: Intubation Date/Time: 12/19/2019 7:56 AM Performed by: Barrington Ellison, CRNA Pre-anesthesia Checklist: Patient identified, Emergency Drugs available, Suction available and Patient being monitored Patient Re-evaluated:Patient Re-evaluated prior to induction Oxygen Delivery Method: Circle System Utilized Preoxygenation: Pre-oxygenation with 100% oxygen Induction Type: IV induction Ventilation: Mask ventilation without difficulty Laryngoscope Size: Glidescope and 4 Grade View: Grade I Tube type: Oral Tube size: 8.0 mm Number of attempts: 2 Airway Equipment and Method: Stylet and Oral airway Placement Confirmation: ETT inserted through vocal cords under direct vision,  positive ETCO2 and breath sounds checked- equal and bilateral Secured at: 24 cm Tube secured with: Tape Dental Injury: Teeth and Oropharynx as per pre-operative assessment  Difficulty Due To: Difficulty was unanticipated and Difficult Airway- due to anterior larynx Future Recommendations: Recommend- induction with short-acting agent, and alternative techniques readily available Comments: First attempt Grade 4 view with Mac 4, narrow pallet and anterior larynx, converted to Glidescope with Grade 1 view

## 2019-12-19 NOTE — Anesthesia Procedure Notes (Signed)
Central Venous Catheter Insertion Performed by: Effie Berkshire, MD, anesthesiologist Start/End7/16/2021 6:50 AM, 12/19/2019 6:55 AM Patient location: Pre-op. Preanesthetic checklist: patient identified, IV checked, site marked, risks and benefits discussed, surgical consent, monitors and equipment checked, pre-op evaluation, timeout performed and anesthesia consent Hand hygiene performed  and maximum sterile barriers used  PA cath was placed.Swan type:thermodilution Procedure performed without using ultrasound guided technique. Attempts: 1 Patient tolerated the procedure well with no immediate complications.

## 2019-12-20 ENCOUNTER — Other Ambulatory Visit: Payer: Self-pay

## 2019-12-20 ENCOUNTER — Inpatient Hospital Stay (HOSPITAL_COMMUNITY): Payer: Medicare HMO

## 2019-12-20 DIAGNOSIS — Z951 Presence of aortocoronary bypass graft: Secondary | ICD-10-CM

## 2019-12-20 DIAGNOSIS — R570 Cardiogenic shock: Secondary | ICD-10-CM

## 2019-12-20 LAB — GLUCOSE, CAPILLARY
Glucose-Capillary: 103 mg/dL — ABNORMAL HIGH (ref 70–99)
Glucose-Capillary: 110 mg/dL — ABNORMAL HIGH (ref 70–99)
Glucose-Capillary: 123 mg/dL — ABNORMAL HIGH (ref 70–99)
Glucose-Capillary: 129 mg/dL — ABNORMAL HIGH (ref 70–99)
Glucose-Capillary: 130 mg/dL — ABNORMAL HIGH (ref 70–99)
Glucose-Capillary: 132 mg/dL — ABNORMAL HIGH (ref 70–99)
Glucose-Capillary: 137 mg/dL — ABNORMAL HIGH (ref 70–99)
Glucose-Capillary: 139 mg/dL — ABNORMAL HIGH (ref 70–99)
Glucose-Capillary: 141 mg/dL — ABNORMAL HIGH (ref 70–99)
Glucose-Capillary: 142 mg/dL — ABNORMAL HIGH (ref 70–99)

## 2019-12-20 LAB — BASIC METABOLIC PANEL
Anion gap: 11 (ref 5–15)
Anion gap: 11 (ref 5–15)
BUN: 24 mg/dL — ABNORMAL HIGH (ref 8–23)
BUN: 31 mg/dL — ABNORMAL HIGH (ref 8–23)
CO2: 20 mmol/L — ABNORMAL LOW (ref 22–32)
CO2: 21 mmol/L — ABNORMAL LOW (ref 22–32)
Calcium: 7.9 mg/dL — ABNORMAL LOW (ref 8.9–10.3)
Calcium: 7.7 mg/dL — ABNORMAL LOW (ref 8.9–10.3)
Chloride: 109 mmol/L (ref 98–111)
Chloride: 104 mmol/L (ref 98–111)
Creatinine, Ser: 2.58 mg/dL — ABNORMAL HIGH (ref 0.61–1.24)
Creatinine, Ser: 3.57 mg/dL — ABNORMAL HIGH (ref 0.61–1.24)
GFR calc Af Amer: 30 mL/min — ABNORMAL LOW (ref >60)
GFR calc Af Amer: 20 mL/min — ABNORMAL LOW (ref >60)
GFR calc non Af Amer: 26 mL/min — ABNORMAL LOW (ref >60)
GFR calc non Af Amer: 17 mL/min — ABNORMAL LOW (ref >60)
Glucose, Bld: 136 mg/dL — ABNORMAL HIGH (ref 70–99)
Glucose, Bld: 199 mg/dL — ABNORMAL HIGH (ref 70–99)
Potassium: 4.7 mmol/L (ref 3.5–5.1)
Potassium: 5.8 mmol/L — ABNORMAL HIGH (ref 3.5–5.1)
Sodium: 140 mmol/L (ref 135–145)
Sodium: 136 mmol/L (ref 135–145)

## 2019-12-20 LAB — CBC
HCT: 29.2 % — ABNORMAL LOW (ref 39.0–52.0)
HCT: 28.4 % — ABNORMAL LOW (ref 39.0–52.0)
Hemoglobin: 8.9 g/dL — ABNORMAL LOW (ref 13.0–17.0)
Hemoglobin: 8.8 g/dL — ABNORMAL LOW (ref 13.0–17.0)
MCH: 25.8 pg — ABNORMAL LOW (ref 26.0–34.0)
MCH: 26.7 pg (ref 26.0–34.0)
MCHC: 30.5 g/dL (ref 30.0–36.0)
MCHC: 31 g/dL (ref 30.0–36.0)
MCV: 84.6 fL (ref 80.0–100.0)
MCV: 86.1 fL (ref 80.0–100.0)
Platelets: 168 10*3/uL (ref 150–400)
Platelets: 136 10*3/uL — ABNORMAL LOW (ref 150–400)
RBC: 3.45 MIL/uL — ABNORMAL LOW (ref 4.22–5.81)
RBC: 3.3 MIL/uL — ABNORMAL LOW (ref 4.22–5.81)
RDW: 15.3 % (ref 11.5–15.5)
RDW: 15.6 % — ABNORMAL HIGH (ref 11.5–15.5)
WBC: 16.7 10*3/uL — ABNORMAL HIGH (ref 4.0–10.5)
WBC: 15.6 10*3/uL — ABNORMAL HIGH (ref 4.0–10.5)
nRBC: 0 % (ref 0.0–0.2)
nRBC: 0 % (ref 0.0–0.2)

## 2019-12-20 LAB — MAGNESIUM
Magnesium: 2.5 mg/dL — ABNORMAL HIGH (ref 1.7–2.4)
Magnesium: 2.6 mg/dL — ABNORMAL HIGH (ref 1.7–2.4)

## 2019-12-20 LAB — COOXEMETRY PANEL
Carboxyhemoglobin: 0.8 % (ref 0.5–1.5)
Carboxyhemoglobin: 0.7 % (ref 0.5–1.5)
Carboxyhemoglobin: 0.8 % (ref 0.5–1.5)
Methemoglobin: 0.6 % (ref 0.0–1.5)
Methemoglobin: 0.8 % (ref 0.0–1.5)
Methemoglobin: 0.8 % (ref 0.0–1.5)
O2 Saturation: 66.9 %
O2 Saturation: 46.1 %
O2 Saturation: 57 %
Total hemoglobin: 10 g/dL — ABNORMAL LOW (ref 12.0–16.0)
Total hemoglobin: 10.6 g/dL — ABNORMAL LOW (ref 12.0–16.0)
Total hemoglobin: 8.9 g/dL — ABNORMAL LOW (ref 12.0–16.0)

## 2019-12-20 LAB — TACROLIMUS LEVEL: Tacrolimus (FK506) - LabCorp: 8.9 ng/mL (ref 2.0–20.0)

## 2019-12-20 MED ORDER — INSULIN ASPART 100 UNIT/ML ~~LOC~~ SOLN
0.0000 [IU] | SUBCUTANEOUS | Status: DC
  Administered 2019-12-20 (×3): 2 [IU] via SUBCUTANEOUS
  Administered 2019-12-21 (×2): 4 [IU] via SUBCUTANEOUS
  Administered 2019-12-21 – 2019-12-22 (×5): 2 [IU] via SUBCUTANEOUS
  Administered 2019-12-22: 4 [IU] via SUBCUTANEOUS
  Administered 2019-12-22 (×2): 2 [IU] via SUBCUTANEOUS
  Administered 2019-12-23: 8 [IU] via SUBCUTANEOUS
  Administered 2019-12-23: 2 [IU] via SUBCUTANEOUS
  Administered 2019-12-23: 8 [IU] via SUBCUTANEOUS
  Administered 2019-12-23 (×2): 2 [IU] via SUBCUTANEOUS
  Administered 2019-12-23 – 2019-12-24 (×2): 4 [IU] via SUBCUTANEOUS
  Administered 2019-12-24: 16 [IU] via SUBCUTANEOUS
  Administered 2019-12-24 (×2): 8 [IU] via SUBCUTANEOUS
  Administered 2019-12-25 (×3): 4 [IU] via SUBCUTANEOUS
  Administered 2019-12-25 – 2019-12-26 (×4): 8 [IU] via SUBCUTANEOUS
  Administered 2019-12-26: 4 [IU] via SUBCUTANEOUS
  Administered 2019-12-26: 8 [IU] via SUBCUTANEOUS
  Administered 2019-12-26 (×2): 4 [IU] via SUBCUTANEOUS
  Administered 2019-12-26: 2 [IU] via SUBCUTANEOUS
  Administered 2019-12-26 – 2019-12-27 (×2): 4 [IU] via SUBCUTANEOUS
  Administered 2019-12-27 (×2): 2 [IU] via SUBCUTANEOUS
  Administered 2019-12-27: 4 [IU] via SUBCUTANEOUS
  Administered 2019-12-27: 8 [IU] via SUBCUTANEOUS
  Administered 2019-12-27 – 2019-12-28 (×5): 2 [IU] via SUBCUTANEOUS
  Administered 2019-12-29: 4 [IU] via SUBCUTANEOUS
  Administered 2019-12-29: 2 [IU] via SUBCUTANEOUS
  Administered 2019-12-30: 3 [IU] via SUBCUTANEOUS
  Administered 2019-12-30 – 2019-12-31 (×3): 2 [IU] via SUBCUTANEOUS
  Administered 2019-12-31: 4 [IU] via SUBCUTANEOUS
  Administered 2020-01-01: 2 [IU] via SUBCUTANEOUS

## 2019-12-20 MED ORDER — EPINEPHRINE HCL 5 MG/250ML IV SOLN IN NS
0.5000 ug/min | INTRAVENOUS | Status: DC
  Administered 2019-12-20: 1 ug/min via INTRAVENOUS
  Administered 2019-12-22: 6 ug/min via INTRAVENOUS
  Filled 2019-12-20 (×2): qty 250

## 2019-12-20 MED ORDER — AMIODARONE HCL IN DEXTROSE 360-4.14 MG/200ML-% IV SOLN
60.0000 mg/h | INTRAVENOUS | Status: AC
  Administered 2019-12-20: 60 mg/h via INTRAVENOUS
  Filled 2019-12-20: qty 200

## 2019-12-20 MED ORDER — FUROSEMIDE 10 MG/ML IJ SOLN
40.0000 mg | Freq: Once | INTRAMUSCULAR | Status: AC
  Administered 2019-12-20: 40 mg via INTRAVENOUS
  Filled 2019-12-20: qty 4

## 2019-12-20 MED ORDER — METOCLOPRAMIDE HCL 5 MG/ML IJ SOLN
10.0000 mg | Freq: Four times a day (QID) | INTRAMUSCULAR | Status: AC
  Administered 2019-12-20 – 2019-12-21 (×3): 10 mg via INTRAVENOUS
  Filled 2019-12-20 (×3): qty 2

## 2019-12-20 MED ORDER — PROMETHAZINE HCL 25 MG/ML IJ SOLN
12.5000 mg | Freq: Four times a day (QID) | INTRAMUSCULAR | Status: DC | PRN
  Administered 2019-12-20 – 2019-12-22 (×3): 12.5 mg via INTRAVENOUS
  Filled 2019-12-20 (×4): qty 1

## 2019-12-20 MED ORDER — AMIODARONE HCL IN DEXTROSE 360-4.14 MG/200ML-% IV SOLN
30.0000 mg/h | INTRAVENOUS | Status: DC
  Administered 2019-12-20 – 2019-12-24 (×8): 30 mg/h via INTRAVENOUS
  Filled 2019-12-20 (×8): qty 200

## 2019-12-20 MED ORDER — METOCLOPRAMIDE HCL 5 MG/ML IJ SOLN
INTRAMUSCULAR | Status: AC
  Administered 2019-12-20: 10 mg via INTRAVENOUS
  Filled 2019-12-20: qty 2

## 2019-12-20 MED ORDER — ATORVASTATIN CALCIUM 80 MG PO TABS
80.0000 mg | ORAL_TABLET | Freq: Every day | ORAL | Status: DC
  Administered 2019-12-22 – 2020-01-01 (×11): 80 mg via ORAL
  Filled 2019-12-20 (×11): qty 1

## 2019-12-20 MED ORDER — ENOXAPARIN SODIUM 30 MG/0.3ML ~~LOC~~ SOLN
30.0000 mg | Freq: Every day | SUBCUTANEOUS | Status: DC
  Administered 2019-12-21 – 2019-12-24 (×4): 30 mg via SUBCUTANEOUS
  Filled 2019-12-20 (×4): qty 0.3

## 2019-12-20 NOTE — Progress Notes (Signed)
Dr. Roxy Manns making rounds. Informed of pt low UOP throughout shift. MD ordered stat coox, lasix 40 mg IV x1dose now, and for pacer rate to be turned to 80. MD to be notified if coox <60.

## 2019-12-20 NOTE — Progress Notes (Signed)
Called MD regarding pt's Co-ox, increased creatine, and low UOP. MD ordered 1 mic of epi and continue to monitor.

## 2019-12-20 NOTE — Progress Notes (Signed)
Patient ID: Joseph Greer, male   DOB: 1956-02-01, 64 y.o.   MRN: [id number]     Advanced Heart Failure Rounding Note  PCP-Cardiologist: Candee Furbish, MD  AHF:  Dr. [name]   Patient Profile   64 y/o male w/ h/o Hep C s/p liver transplant in 2009 (followed at [facility]), Stage III CKD, HTN and T2DM admitted w/ NSTEMI and Afib w/ RVR, found to have severe 3VD and biventricular heart failure and ? PFO, LVEF 20-25%, RV moderately reduced. Awaiting CABG.    Subjective:    - 7/16: CABG x 5 with LIMA-LAD, RIMA-ramus, seq SVG-PD/PL, SVG-D; Maze; LA appendage clipping  Patient is awake and alert this morning, extubated.  Complains of pain at surgical site.  MAP is elevated.  Currently on milrinone 0.125, NE 10, epinephrine 5.    He is in NSR getting amiodarone 400 mg po bid.   Swan numbers: PA 49/21 RA 10 CI 2.8   RHC Procedural Findings (pre-op): Hemodynamics (mmHg) RA mean 8 RV 55/11 PA 60/30, mean 44 PCWP mean 36 Oxygen saturations: PA 62% AO 97% Cardiac Output (Fick) 5.3  Cardiac Index (Fick) 2.61 Cardiac Output (Thermo) 5.11 Cardiac Index (Thermo) 2.52  PVR 1.6 WU  Objective:   Weight Range: 90.9 kg Body mass index is 28.75 kg/m.   Vital Signs:   Temp:  [97.7 F (36.5 C)-99.3 F (37.4 C)] 98.6 F (37 C) (07/17 0800) Pulse Rate:  [74-94] 82 (07/17 0800) Resp:  [12-37] 22 (07/17 0800) BP: (98-146)/(53-74) 139/72 (07/17 0800) SpO2:  [88 %-100 %] 94 % (07/17 0800) Arterial Line BP: (90-128)/(48-62) 90/48 (07/16 1845) FiO2 (%):  [40 %-80 %] 40 % (07/16 2151) Weight:  [90.9 kg] 90.9 kg (07/17 0500) Last BM Date: 12/18/19  Weight change: Filed Weights   12/17/19 0348 12/18/19 0525 12/20/19 0500  Weight: 85.2 kg 84.5 kg 90.9 kg    Intake/Output:   Intake/Output Summary (Last 24 hours) at 12/20/2019 0821 Last data filed at 12/20/2019 0800 Gross per 24 hour  Intake 5858.2 ml  Output 3120 ml  Net 2738.2 ml      Physical Exam   CVP 10 General: NAD Neck:  JVP 10-12 cm, no thyromegaly or thyroid nodule.  Lungs: Decreased at bases.  CV: Nondisplaced PMI.  Heart regular S1/S2, no S3/S4, no murmur.  1+ edema left ankle. Abdomen: Soft, nontender, no hepatosplenomegaly, no distention.  Skin: Intact without lesions or rashes.  Neurologic: Alert and oriented x 3.  Psych: Normal affect. Extremities: No clubbing or cyanosis.  HEENT: Normal.    Telemetry   NSR 80s (personally reviewed)  EKG    No new EKG to review today.   Labs    CBC Recent Labs    12/19/19 2008 12/19/19 2059 12/19/19 2345 12/20/19 0408  WBC 20.6*  --   --  16.7*  HGB 9.1*   < > 9.2* 8.9*  HCT 29.4*   < > 27.0* 29.2*  MCV 85.2  --   --  84.6  PLT 184  --   --  168   < > = values in this interval not displayed.   Basic Metabolic Panel Recent Labs    12/19/19 2008 12/19/19 2059 12/19/19 2345 12/20/19 0408  NA 143   < > 144 140  K 3.8   < > 4.1 4.7  CL 111  --   --  109  CO2 20*  --   --  20*  GLUCOSE 178*  --   --  136*  BUN 20  --   --  24*  CREATININE 2.30*  --   --  2.58*  CALCIUM 7.9*  --   --  7.9*  MG 3.0*  --   --  2.5*   < > = values in this interval not displayed.   Liver Function Tests Recent Labs    12/18/19 0500  AST 26  ALT 32  ALKPHOS 90  BILITOT 0.8  PROT 6.5  ALBUMIN 3.1*   No results for input(s): LIPASE, AMYLASE in the last 72 hours. Cardiac Enzymes No results for input(s): CKTOTAL, CKMB, CKMBINDEX, TROPONINI in the last 72 hours.  BNP: BNP (last 3 results) No results for input(s): BNP in the last 8760 hours.  ProBNP (last 3 results) No results for input(s): PROBNP in the last 8760 hours.   D-Dimer No results for input(s): DDIMER in the last 72 hours. Hemoglobin A1C No results for input(s): HGBA1C in the last 72 hours. Fasting Lipid Panel No results for input(s): CHOL, HDL, LDLCALC, TRIG, CHOLHDL, LDLDIRECT in the last 72 hours. Thyroid Function Tests No results for input(s): TSH, T4TOTAL, T3FREE, THYROIDAB in  the last 72 hours.  Invalid input(s): FREET3  Other results:   Imaging    DG Chest Port 1 View  Result Date: 12/19/2019 CLINICAL DATA:  Status post thoracic surgery. EXAM: PORTABLE CHEST 1 VIEW COMPARISON:  December 09, 2019. FINDINGS: Endotracheal and nasogastric tubes are in good position. Right internal jugular catheter is noted with distal tip in expected position of main pulmonary artery. Bilateral chest tubes are noted without pneumothorax. Right-sided PICC line is unchanged in position. No pneumothorax or pleural effusion is noted. Lungs are clear. Bony thorax is unremarkable. IMPRESSION: Stable support apparatus. Bilateral chest tubes are noted without pneumothorax. No acute cardiopulmonary abnormality seen. Electronically Signed   By: Marijo Conception M.D.   On: 12/19/2019 15:49   ECHO INTRAOPERATIVE TEE  Result Date: 12/19/2019  *INTRAOPERATIVE TRANSESOPHAGEAL REPORT *  Patient Name:   Joseph Greer Joseph Greer Date of Exam: 12/19/2019 Medical Rec #:  [id number]            Height:       70.0 in Accession #:    [id number]           Weight:       186.3 lb Date of Birth:  [birth year]            BSA:          2.03 m Patient Age:    38 years             BP:           103/47 mmHg Patient Gender: M                    HR:           81 bpm. Exam Location:  Inpatient Transesophogeal exam was perform intraoperatively during surgical procedure. Patient was closely monitored under general anesthesia during the entirety of examination. Indications:     coronary artery disease Performing Phys: [place] GOLD Diagnosing Phys: Hoy Morn MD Complications: No known complications during this procedure. POST-OP IMPRESSIONS - Left Ventricle: has severely reduced systolic function, with an ejection fraction of 25%. The wall motion is abnormal with regional variation. Improved anterior wall motion following CPB, but LV still severely reduced function. - Aorta: The aorta appears unchanged from pre-bypass. - Aortic Valve: No  evidence of AV insufficiency following separation  from CPB. - Mitral Valve: Mitral valve regurgitation is trivial following separation from CPB. - Tricuspid Valve: The tricuspid valve appears unchanged from pre-bypass. - Interatrial Septum: s/p PFO closure with no evidence of intra-atrial shunting by Color flow doppler. - Pericardium: The pericardium appears unchanged from pre-bypass. PRE-OP FINDINGS  Left Ventricle: The left ventricle has severely reduced systolic function, with an ejection fraction of 20-25%. The cavity size was severely dilated. There is no increase in left ventricular wall thickness. Left ventricular diffuse hypokinesis. Right Ventricle: The right ventricle has mildly reduced systolic function. The cavity was normal. There is no increase in right ventricular wall thickness. Left Atrium: Left atrial size was dilated. The left atrial appendage is well visualized and there is no evidence of thrombus present. Left atrial appendage velocity is reduced at less than 40 cm/s. Right Atrium: Interatrial Septum: Evidence of atrial level shunting detected by color flow Doppler. Pericardium: There is no evidence of pericardial effusion. Mitral Valve: The mitral valve is normal in structure. Mild thickening of the mitral valve leaflet. Mild calcification of the mitral valve leaflet. Mitral valve regurgitation is mild to moderate by color flow Doppler. The MR jet is centrally-directed. There is No evidence of mitral stenosis. Tricuspid Valve: The tricuspid valve was normal in structure. Tricuspid valve regurgitation is trivial by color flow Doppler. Aortic Valve: The aortic valve is normal in structure. Aortic valve regurgitation is trivial by color flow Doppler. There is no evidence of aortic valve stenosis. Pulmonic Valve: The pulmonic valve was normal in structure. Pulmonic valve regurgitation is not visualized by color flow Doppler. Aorta: The aortic root, ascending aorta and aortic arch are normal in size  and structure. +--------------+-------++ LEFT VENTRICLE        +--------------+-------++ PLAX 2D               +--------------+-------++ LVIDd:        7.28 cm +--------------+-------++ LVIDs:        6.71 cm +--------------+-------++ LV SV:        47 ml   +--------------+-------++ LV SV Index:  22.79   +--------------+-------++                       +--------------+-------++ +----------------+-----------++ MR Peak grad:   92.2 mmHg   +----------------+-----------++ MR Mean grad:   58.0 mmHg   +----------------+-----------++ MR Vmax:        480.00 cm/s +----------------+-----------++ MR Vmean:       349.0 cm/s  +----------------+-----------++ MR PISA:        1.01 cm    +----------------+-----------++ MR PISA Eff ROA:8 mm       +----------------+-----------++ MR PISA Radius: 0.40 cm     +----------------+-----------++  Hoy Morn MD Electronically signed by Hoy Morn MD Signature Date/Time: 12/19/2019/4:54:21 PM    Final      Medications:     Scheduled Medications: . acetaminophen  1,000 mg Oral Q6H   Or  . acetaminophen (TYLENOL) oral liquid 160 mg/5 mL  1,000 mg Per Tube Q6H  . amiodarone  400 mg Oral BID  . Apremilast  1 tablet Oral BID  . aspirin EC  325 mg Oral Daily   Or  . aspirin  324 mg Per Tube Daily  . atorvastatin  80 mg Oral Daily  . bisacodyl  10 mg Oral Daily   Or  . bisacodyl  10 mg Rectal Daily  . chlorhexidine gluconate (MEDLINE KIT)  15 mL Mouth Rinse BID  . Chlorhexidine  Gluconate Cloth  6 each Topical Daily  . docusate sodium  200 mg Oral Daily  . metoprolol tartrate  12.5 mg Oral BID   Or  . metoprolol tartrate  12.5 mg Per Tube BID  . [START ON 12/21/2019] pantoprazole  40 mg Oral Daily  . sodium chloride flush  10-40 mL Intracatheter Q12H  . sodium chloride flush  3 mL Intravenous Q12H  . tacrolimus  2 mg Oral BID  . venlafaxine XR  75 mg Oral Daily    Infusions: . sodium chloride    . sodium chloride     . sodium chloride 20 mL/hr at 12/19/19 1448  . albumin human 12.5 g (12/19/19 1845)  . cefUROXime (ZINACEF)  IV Stopped (12/20/19 0600)  . dexmedetomidine (PRECEDEX) IV infusion Stopped (12/20/19 0532)  . epinephrine 5 mcg/min (12/20/19 0800)  . insulin 2.6 mL/hr at 12/20/19 0800  . lactated ringers    . lactated ringers Stopped (12/19/19 1448)  . lactated ringers 20 mL/hr at 12/20/19 0800  . milrinone 0.125 mcg/kg/min (12/20/19 0800)  . nitroGLYCERIN Stopped (12/19/19 1449)  . norepinephrine (LEVOPHED) Adult infusion Stopped (12/20/19 0101)  . phenylephrine (NEO-SYNEPHRINE) Adult infusion 20 mcg/min (12/20/19 0800)    PRN Medications: sodium chloride, albumin human, dextrose, lactated ringers, metoprolol tartrate, midazolam, morphine injection, ondansetron (ZOFRAN) IV, oxyCODONE, sodium chloride flush, sodium chloride flush, traMADol    Assessment/Plan   1. CAD: Severe 3VD on cath 7/21 => 60-70% ostial LAD, 80% mLAD, 95% ramus, 99% proximal LCx with TIMI-2 flow down the rest of the LCx, occluded distal RCA.  Suspect out of hospital inferolateral MI.  Cardiac MRI showed that the inferolateral wall is likely not viable, but the remainder of the LV myocardium is likely viable. He had CABG 7/16 with LIMA-LAD, RIMA-ramus, seq SVG-PD/PL, SVG-D.  - Continue ASA + atorvastatin.   2. Acute on chronic systolic CHF: Echo with EF 20-25%, moderately decreased RV systolic function. Cardiac MRI with LV EF 26%, RV EF 38%, near full thickness scar in inferolateral wall suggesting lack of viability in the LCx territory.  Now post-CABG.  Stable MAP now on epinephrine 5 + milrinone 0.125.  CI 2.8. CVP 10. Weight up post-op.  - Will give Lasix 40 mg IV x 1.  - Wean epinephrine as tolerated, continue milrinone.  - Follow co-ox.  3. Atrial fibrillation: Patient admitted with afib/RVR, now in NSR. S/p Maze + LAA clipping on 7/16.  - Continue amiodarone 400 mg bid, watch closely on milrinone, may need to  transition to IV.  - Resume anticoagulation when stable per TCTS.  4. AKI on CKD stage 3: Creatine has been stable 1.9-2.3 range in hospital pre-op.  Was 1.3 back in 4/21. Creatinine 2.5 post-op. - Follow closely, maintain MAP and cardiac output.  5. Type 2 diabetes: SSI. SGLT2 inhibitor as outpatient.  6. Liver transplant for HCV: Continue tacrolimus, will need to follow levels (sent).   CRITICAL CARE Performed by: Loralie Champagne  Total critical care time: 35 minutes  Critical care time was exclusive of separately billable procedures and treating other patients.  Critical care was necessary to treat or prevent imminent or life-threatening deterioration.  Critical care was time spent personally by me on the following activities: development of treatment plan with patient and/or surrogate as well as nursing, discussions with consultants, evaluation of patient's response to treatment, examination of patient, obtaining history from patient or surrogate, ordering and performing treatments and interventions, ordering and review of laboratory studies, ordering and review  of radiographic studies, pulse oximetry and re-evaluation of patient's condition.  Loralie Champagne 12/20/2019 8:29 AM

## 2019-12-20 NOTE — Progress Notes (Signed)
TCTS BRIEF SICU PROGRESS NOTE  1 Day Post-Op  S/P Procedure(s) (LRB): CORONARY ARTERY BYPASS GRAFTING (CABG) TIMES  X 5,   USING BILATERAL MAMMARY ARTERY AND RIGHT LEG GREATER SAPHENOUS VEIN HARVESTED ENDOSCOPICALLY (N/A) TRANSESOPHAGEAL ECHOCARDIOGRAM (TEE) (N/A) INDOCYANINE GREEN FLUORESCENCE IMAGING (ICG) (N/A) MAZE (N/A) CLIPPING OF ATRIAL APPENDAGE USING ATRICURE CLIP SIZE 50MM (N/A)   Still nauseated NSR w/ stable BP Repeat co-ox 46% UOP marginal  Plan: Increase milrinone 0.5 and increase HR w/ AAI pacing.  Check repeat co-ox in 2 hours  Rexene Alberts, MD 12/20/2019 5:24 PM

## 2019-12-20 NOTE — Progress Notes (Signed)
Dr. Roxy Manns paged r/t coox of 46. MD gave orders to titrate milrinone to 0.5 mcg/kg/min and to repeat coox in 2 hours.

## 2019-12-20 NOTE — Progress Notes (Addendum)
[address]       [place] [address]             [phone]        CARDIOTHORACIC SURGERY PROGRESS NOTE   R1 Day Post-Op Procedure(s) (LRB): CORONARY ARTERY BYPASS GRAFTING (CABG) TIMES  X 5,   USING BILATERAL MAMMARY ARTERY AND RIGHT LEG GREATER SAPHENOUS VEIN HARVESTED ENDOSCOPICALLY (N/A) TRANSESOPHAGEAL ECHOCARDIOGRAM (TEE) (N/A) INDOCYANINE GREEN FLUORESCENCE IMAGING (ICG) (N/A) MAZE (N/A) CLIPPING OF ATRIAL APPENDAGE USING ATRICURE CLIP SIZE 50MM (N/A)  Subjective: Nauseated this morning.  Otherwise doing fairly well.  Objective: Vital signs: BP Readings from Last 1 Encounters:  12/20/19 113/66   Pulse Readings from Last 1 Encounters:  12/20/19 60   Resp Readings from Last 1 Encounters:  12/20/19 (!) 30   Temp Readings from Last 1 Encounters:  12/20/19 98.4 F (36.9 C)    Hemodynamics: PAP: (29-54)/(6-23) 49/23 CVP:  [9 mmHg-16 mmHg] 16 mmHg CO:  [4.8 L/min-5.8 L/min] 4.9 L/min CI:  [2.4 L/min/m2-2.8 L/min/m2] 2.4 L/min/m2  Mixed venous co-ox 67%   Physical Exam:  Rhythm:   NSR  Breath sounds: clear  Heart sounds:  RRR  Incisions:  Dressings dry, intact  Abdomen:  Soft, non-distended, non-tender  Extremities:  Warm, well-perfused  Chest tubes:  low volume thin serosanguinous output, no air leak    Intake/Output from previous day: 07/16 0701 - 07/17 0700 In: 6098.8 [P.O.:270; I.V.:3952.6; Blood:490; IV Piggyback:1386.2] Out: 9476 [Urine:1005; LYYTK:354; Chest Tube:1140] Intake/Output this shift: Total I/O In: 124.1 [I.V.:124.1] Out: 260 [Urine:80; Chest Tube:180]  Lab Results:  CBC: Recent Labs    12/19/19 2008 12/19/19 2059 12/19/19 2345 12/20/19 0408  WBC 20.6*  --   --  16.7*  HGB 9.1*   < > 9.2* 8.9*  HCT 29.4*   < > 27.0* 29.2*  PLT 184  --   --  168   < > = values in this interval not displayed.    BMET:  Recent Labs    12/19/19 2008 12/19/19 2059 12/19/19 2345 12/20/19 0408  NA 143   < > 144 140   K 3.8   < > 4.1 4.7  CL 111  --   --  109  CO2 20*  --   --  20*  GLUCOSE 178*  --   --  136*  BUN 20  --   --  24*  CREATININE 2.30*  --   --  2.58*  CALCIUM 7.9*  --   --  7.9*   < > = values in this interval not displayed.     PT/INR:   Recent Labs    12/19/19 1500  LABPROT 16.6*  INR 1.4*    CBG (last 3)  Recent Labs    12/20/19 0416 12/20/19 0557 12/20/19 0807  GLUCAP 123* 141* 129*    ABG    Component Value Date/Time   PHART 7.328 (L) 12/19/2019 2345   PCO2ART 40.6 12/19/2019 2345   PO2ART 72 (L) 12/19/2019 2345   HCO3 21.3 12/19/2019 2345   TCO2 23 12/19/2019 2345   ACIDBASEDEF 4.0 (H) 12/19/2019 2345   O2SAT 66.9 12/20/2019 0854    CXR: Mild gaseous distention of stomach, mild LLL atelectasis, otherwise clear  EKG: NSR w/out acute ischemic changes    Assessment/Plan: S/P Procedure(s) (LRB): CORONARY ARTERY BYPASS GRAFTING (CABG) TIMES  X 5,   USING BILATERAL MAMMARY ARTERY AND RIGHT LEG GREATER SAPHENOUS VEIN HARVESTED ENDOSCOPICALLY (N/A) TRANSESOPHAGEAL ECHOCARDIOGRAM (TEE) (  N/A) INDOCYANINE GREEN FLUORESCENCE IMAGING (ICG) (N/A) MAZE (N/A) CLIPPING OF ATRIAL APPENDAGE USING ATRICURE CLIP SIZE 50MM (N/A)  Overall doing well POD1 Maintaining NSR w/ stable hemodynamics on low dose milrinone, off all other drips Breathing comfortably w/ O2 sats 98% on 4 L/min, CXR looks good Expected post op acute blood loss anemia, mild Acute on chronic systolic CHF with expected post-op volume excess, mild Post op elevated serum creatinine - acute exacerbation of baseline CKD, likely due to prerenal azotemia +/- acute kidney injury caused by ATN, UOP marginal Expected post op atelectasis, mild Recurrent PAF, now s/p maze H/O OLT in past Type II diabetes mellitus, excellent glycemic control   Mobilize  Continue milrinone for now and follow co-ox  Lasix to stimulate UOP, watch renal function  D/C lines  Restart tacrolimus  Will give amiodarone IV  instead of oral due to severe nausea  Rexene Alberts, MD 12/20/2019 10:24 AM

## 2019-12-21 ENCOUNTER — Inpatient Hospital Stay (HOSPITAL_COMMUNITY): Payer: Medicare HMO

## 2019-12-21 DIAGNOSIS — N179 Acute kidney failure, unspecified: Secondary | ICD-10-CM

## 2019-12-21 DIAGNOSIS — I340 Nonrheumatic mitral (valve) insufficiency: Secondary | ICD-10-CM

## 2019-12-21 LAB — BASIC METABOLIC PANEL
Anion gap: 10 (ref 5–15)
Anion gap: 12 (ref 5–15)
BUN: 35 mg/dL — ABNORMAL HIGH (ref 8–23)
BUN: 43 mg/dL — ABNORMAL HIGH (ref 8–23)
CO2: 21 mmol/L — ABNORMAL LOW (ref 22–32)
CO2: 18 mmol/L — ABNORMAL LOW (ref 22–32)
Calcium: 7.8 mg/dL — ABNORMAL LOW (ref 8.9–10.3)
Calcium: 7.5 mg/dL — ABNORMAL LOW (ref 8.9–10.3)
Chloride: 104 mmol/L (ref 98–111)
Chloride: 99 mmol/L (ref 98–111)
Creatinine, Ser: 4.22 mg/dL — ABNORMAL HIGH (ref 0.61–1.24)
Creatinine, Ser: 4.99 mg/dL — ABNORMAL HIGH (ref 0.61–1.24)
GFR calc Af Amer: 16 mL/min — ABNORMAL LOW (ref >60)
GFR calc Af Amer: 13 mL/min — ABNORMAL LOW (ref >60)
GFR calc non Af Amer: 14 mL/min — ABNORMAL LOW (ref >60)
GFR calc non Af Amer: 12 mL/min — ABNORMAL LOW (ref >60)
Glucose, Bld: 152 mg/dL — ABNORMAL HIGH (ref 70–99)
Glucose, Bld: 186 mg/dL — ABNORMAL HIGH (ref 70–99)
Potassium: 5.5 mmol/L — ABNORMAL HIGH (ref 3.5–5.1)
Potassium: 4.7 mmol/L (ref 3.5–5.1)
Sodium: 135 mmol/L (ref 135–145)
Sodium: 129 mmol/L — ABNORMAL LOW (ref 135–145)

## 2019-12-21 LAB — GLUCOSE, CAPILLARY
Glucose-Capillary: 134 mg/dL — ABNORMAL HIGH (ref 70–99)
Glucose-Capillary: 133 mg/dL — ABNORMAL HIGH (ref 70–99)
Glucose-Capillary: 163 mg/dL — ABNORMAL HIGH (ref 70–99)
Glucose-Capillary: 173 mg/dL — ABNORMAL HIGH (ref 70–99)
Glucose-Capillary: 143 mg/dL — ABNORMAL HIGH (ref 70–99)

## 2019-12-21 LAB — COOXEMETRY PANEL
Carboxyhemoglobin: 0.9 % (ref 0.5–1.5)
Methemoglobin: 0.7 % (ref 0.0–1.5)
O2 Saturation: 66.9 %
Total hemoglobin: 8.6 g/dL — ABNORMAL LOW (ref 12.0–16.0)

## 2019-12-21 LAB — ECHOCARDIOGRAM LIMITED
Area-P 1/2: 4.89 cm2
Calc EF: 33.2 %
Height: 70 in
Single Plane A2C EF: 40.7 %
Single Plane A4C EF: 24 %
Weight: 3241.64 oz

## 2019-12-21 LAB — CBC
HCT: 27.1 % — ABNORMAL LOW (ref 39.0–52.0)
Hemoglobin: 8.3 g/dL — ABNORMAL LOW (ref 13.0–17.0)
MCH: 26.5 pg (ref 26.0–34.0)
MCHC: 30.6 g/dL (ref 30.0–36.0)
MCV: 86.6 fL (ref 80.0–100.0)
Platelets: 133 10*3/uL — ABNORMAL LOW (ref 150–400)
RBC: 3.13 MIL/uL — ABNORMAL LOW (ref 4.22–5.81)
RDW: 15.5 % (ref 11.5–15.5)
WBC: 18 10*3/uL — ABNORMAL HIGH (ref 4.0–10.5)
nRBC: 0 % (ref 0.0–0.2)

## 2019-12-21 MED ORDER — ~~LOC~~ CARDIAC SURGERY, PATIENT & FAMILY EDUCATION: Freq: Once | Status: AC

## 2019-12-21 MED ORDER — PATIROMER SORBITEX CALCIUM 8.4 G PO PACK
8.4000 g | PACK | Freq: Every day | ORAL | Status: DC
  Administered 2019-12-21: 8.4 g via ORAL
  Filled 2019-12-21: qty 1

## 2019-12-21 MED ORDER — FUROSEMIDE 10 MG/ML IJ SOLN
40.0000 mg | Freq: Once | INTRAMUSCULAR | Status: AC
  Administered 2019-12-21: 40 mg via INTRAVENOUS
  Filled 2019-12-21: qty 4

## 2019-12-21 MED ORDER — SENNOSIDES-DOCUSATE SODIUM 8.6-50 MG PO TABS: 1.0000 | ORAL_TABLET | Freq: Every evening | ORAL | Status: DC | PRN

## 2019-12-21 MED ORDER — FUROSEMIDE 10 MG/ML IJ SOLN
120.0000 mg | Freq: Once | INTRAVENOUS | Status: AC
  Administered 2019-12-21: 120 mg via INTRAVENOUS
  Filled 2019-12-21: qty 10

## 2019-12-21 NOTE — Progress Notes (Signed)
°  Echocardiogram 2D Echocardiogram limited has been performed.  Darlina Sicilian M 12/21/2019, 9:05 AM

## 2019-12-21 NOTE — Progress Notes (Addendum)
Patient ID: Joseph Greer, male   DOB: July 27, 1955, 64 y.o.   MRN: [id number]     Advanced Heart Failure Rounding Note  PCP-Cardiologist: Candee Furbish, MD  AHF:  Dr. [name]   Patient Profile   64 y/o male w/ h/o Hep C s/p liver transplant in 2009 (followed at [facility]), Stage III CKD, HTN and T2DM admitted w/ NSTEMI and Afib w/ RVR, found to have severe 3VD and biventricular heart failure and ? PFO, LVEF 20-25%, RV moderately reduced. Awaiting CABG.    Subjective:    - 7/16: CABG x 5 with LIMA-LAD, RIMA-ramus, seq SVG-PD/PL, SVG-D; Maze; LA appendage clipping  Patient is awake and alert this morning.  Complains of pain at surgical site but improved.  MAP stable.    Poor UOP yesterday with low co-ox, milrinone was increased and he was put back on epinephrine.  Co-ox this morning 67%.  UOP only 285 cc for the day, CVP 11 today.  Currently on milrinone 0.5, epinephrine 1.  Creatinine up to 4.22, steady rise.   He is in NSR on amiodarone gtt at 30 mg/hr.    RHC Procedural Findings (pre-op): Hemodynamics (mmHg) RA mean 8 RV 55/11 PA 60/30, mean 44 PCWP mean 36 Oxygen saturations: PA 62% AO 97% Cardiac Output (Fick) 5.3  Cardiac Index (Fick) 2.61 Cardiac Output (Thermo) 5.11 Cardiac Index (Thermo) 2.52  PVR 1.6 WU  Objective:   Weight Range: 91.9 kg Body mass index is 29.07 kg/m.   Vital Signs:   Temp:  [96.4 F (35.8 C)-99.2 F (37.3 C)] 99.2 F (37.3 C) (07/18 0802) Pulse Rate:  [59-89] 89 (07/18 0800) Resp:  [11-30] 18 (07/18 0800) BP: (98-136)/(52-97) 114/52 (07/18 0800) SpO2:  [90 %-99 %] 96 % (07/18 0800) Weight:  [91.9 kg] 91.9 kg (07/18 0500) Last BM Date: 12/18/19  Weight change: Filed Weights   12/18/19 0525 12/20/19 0500 12/21/19 0500  Weight: 84.5 kg 90.9 kg 91.9 kg    Intake/Output:   Intake/Output Summary (Last 24 hours) at 12/21/2019 0812 Last data filed at 12/21/2019 0757 Gross per 24 hour  Intake 1661.51 ml  Output 1350 ml  Net 311.51 ml       Physical Exam   CVP 11 General: NAD Neck: JVP 10 cm, no thyromegaly or thyroid nodule.  Lungs: Clear to auscultation bilaterally with normal respiratory effort. CV: Nondisplaced PMI.  Heart regular S1/S2, no S3/S4, no murmur.  1+ ankle edema.   Abdomen: Soft, nontender, no hepatosplenomegaly, no distention.  Skin: Intact without lesions or rashes.  Neurologic: Alert and oriented x 3.  Psych: Normal affect. Extremities: No clubbing or cyanosis.  HEENT: Normal.    Telemetry   NSR 80s (personally reviewed)  EKG    No new EKG to review today.   Labs    CBC Recent Labs    12/20/19 1825 12/21/19 0231  WBC 15.6* 18.0*  HGB 8.8* 8.3*  HCT 28.4* 27.1*  MCV 86.1 86.6  PLT 136* 010*   Basic Metabolic Panel Recent Labs    12/20/19 0408 12/20/19 0408 12/20/19 1825 12/21/19 0231  NA 140   < > 136 135  K 4.7   < > 5.8* 5.5*  CL 109   < > 104 104  CO2 20*   < > 21* 21*  GLUCOSE 136*   < > 199* 152*  BUN 24*   < > 31* 35*  CREATININE 2.58*   < > 3.57* 4.22*  CALCIUM 7.9*   < > 7.7* 7.8*  MG 2.5*  --  2.6*  --    < > = values in this interval not displayed.   Liver Function Tests No results for input(s): AST, ALT, ALKPHOS, BILITOT, PROT, ALBUMIN in the last 72 hours. No results for input(s): LIPASE, AMYLASE in the last 72 hours. Cardiac Enzymes No results for input(s): CKTOTAL, CKMB, CKMBINDEX, TROPONINI in the last 72 hours.  BNP: BNP (last 3 results) No results for input(s): BNP in the last 8760 hours.  ProBNP (last 3 results) No results for input(s): PROBNP in the last 8760 hours.   D-Dimer No results for input(s): DDIMER in the last 72 hours. Hemoglobin A1C No results for input(s): HGBA1C in the last 72 hours. Fasting Lipid Panel No results for input(s): CHOL, HDL, LDLCALC, TRIG, CHOLHDL, LDLDIRECT in the last 72 hours. Thyroid Function Tests No results for input(s): TSH, T4TOTAL, T3FREE, THYROIDAB in the last 72 hours.  Invalid input(s):  FREET3  Other results:   Imaging    No results found.   Medications:     Scheduled Medications: . acetaminophen  1,000 mg Oral Q6H  . Apremilast  1 tablet Oral BID  . aspirin EC  325 mg Oral Daily  . [START ON 12/22/2019] atorvastatin  80 mg Oral Daily  . bisacodyl  10 mg Oral Daily   Or  . bisacodyl  10 mg Rectal Daily  . chlorhexidine gluconate (MEDLINE KIT)  15 mL Mouth Rinse BID  . Chlorhexidine Gluconate Cloth  6 each Topical Daily  . docusate sodium  200 mg Oral Daily  . enoxaparin (LOVENOX) injection  30 mg Subcutaneous QHS  . insulin aspart  0-24 Units Subcutaneous Q4H  . metoprolol tartrate  12.5 mg Oral BID  . pantoprazole  40 mg Oral Daily  . sodium chloride flush  10-40 mL Intracatheter Q12H  . sodium chloride flush  3 mL Intravenous Q12H  . tacrolimus  2 mg Oral BID  . venlafaxine XR  75 mg Oral Daily    Infusions: . sodium chloride Stopped (12/21/19 0641)  . amiodarone 30 mg/hr (12/21/19 0757)  . epinephrine 1 mcg/min (12/21/19 0757)  . insulin Stopped (12/20/19 1147)  . lactated ringers Stopped (12/19/19 1448)  . lactated ringers Stopped (12/20/19 1152)  . milrinone 0.5 mcg/kg/min (12/21/19 0757)  . phenylephrine (NEO-SYNEPHRINE) Adult infusion Stopped (12/20/19 0818)    PRN Medications: dextrose, metoprolol tartrate, morphine injection, ondansetron (ZOFRAN) IV, oxyCODONE, promethazine, sodium chloride flush, sodium chloride flush, traMADol    Assessment/Plan   1. CAD: Severe 3VD on cath 7/21 => 60-70% ostial LAD, 80% mLAD, 95% ramus, 99% proximal LCx with TIMI-2 flow down the rest of the LCx, occluded distal RCA.  Suspect out of hospital inferolateral MI.  Cardiac MRI showed that the inferolateral wall is likely not viable, but the remainder of the LV myocardium is likely viable. He had CABG 7/16 with LIMA-LAD, RIMA-ramus, seq SVG-PD/PL, SVG-D.  - Continue ASA + atorvastatin.   2. Acute on chronic systolic CHF: Echo with EF 20-25%, moderately  decreased RV systolic function. Cardiac MRI with LV EF 26%, RV EF 38%, near full thickness scar in inferolateral wall suggesting lack of viability in the LCx territory.  Now post-CABG.  Stable MAP now on epinephrine 1 + milrinone 0.5.  CVP 11 with minimal UOP to Lasix 40 mg IV x 1 yesterday.  Creatinine up to 4.22. Co-ox 67%, adequate. Suspect post-op ATN.  - Will give Lasix 40 mg IV x 1 again today to see if any response.  Unfortunately, may end up needing CVVH, will consult renal.  - Continue low dose epinephrine and milrinone for now to maintain cardiac output.  - Limited echo, make sure no pericardial effusion, etc.  3. Atrial fibrillation: Patient admitted with afib/RVR, now in NSR. S/p Maze + LAA clipping on 7/16.  - Continue amiodarone gtt for now.  - Resume anticoagulation when stable per TCTS.  4. AKI on CKD stage 3: Creatine has been stable 1.9-2.3 range in hospital pre-op.  Was 1.3 back in 4/21. Creatinine 2.5 => now 4.22 post-op. Suspect post-op ATN.  CVP mildly elevated, K 5.5. MAP and cardiac output look optimized.  - Will give 1 dose of Veltassa. - Lasix 40 mg IV x 1 this morning to see if any response.  - Consult renal, may end up needing CVVH.   - BMET in pm.  5. Type 2 diabetes: SSI. SGLT2 inhibitor as outpatient if renal function stabilizes.  6. Liver transplant for HCV: Continue tacrolimus, will need to follow levels (sent).  - Sending level today with AKI.   CRITICAL CARE Performed by: Loralie Champagne  Total critical care time: 35 minutes  Critical care time was exclusive of separately billable procedures and treating other patients.  Critical care was necessary to treat or prevent imminent or life-threatening deterioration.  Critical care was time spent personally by me on the following activities: development of treatment plan with patient and/or surrogate as well as nursing, discussions with consultants, evaluation of patient's response to treatment, examination of  patient, obtaining history from patient or surrogate, ordering and performing treatments and interventions, ordering and review of laboratory studies, ordering and review of radiographic studies, pulse oximetry and re-evaluation of patient's condition.  Loralie Champagne 12/21/2019 8:12 AM

## 2019-12-21 NOTE — Progress Notes (Signed)
MEDICATION RELATED CONSULT NOTE - FOLLOW UP  Patient is s/p liver transplant in 2009 (followed at [facility]) on tacrolimus.  Patient was started on amiodarone 200mg  BID on 7/12 for new Afib w/ RVR, now in NSR. Dose was increased to 400mg  BID on 7/14, and then changed to amiodarone drip post-op due to severe nausea.  Tacrolimus levels drawn on 7/14 and 7/15 were not true trough levels and drawn more as a peak level.  Goal: Tacrolimus level 2-3 (from [employer] notes)  Plan: Check tacrolimus trough level today before 1000 dose   Richardine Service, PharmD PGY2 Cardiology Pharmacy Resident Phone: [phone] 12/21/2019  2:40 PM  Please check [email or web address] for unit-specific pharmacy phone numbers.

## 2019-12-21 NOTE — Plan of Care (Signed)
Pt rested comfortably throughout the night, did receive PRN medication a couple times for mild sternal discomfort. Pt was reminded often of splinting and breathing exercises. Upon standing this morning, pt developed nausea and started dry heaving, he was assisted to the chair. Scheduled antiemetic given. Call bell is within reach. Problem: Education: Goal: Knowledge of General Education information will improve Description: Including pain rating scale, medication(s)/side effects and non-pharmacologic comfort measures Outcome: Progressing   Problem: Clinical Measurements: Goal: Cardiovascular complication will be avoided Outcome: Progressing   Problem: Coping: Goal: Level of anxiety will decrease Outcome: Progressing   Problem: Elimination: Goal: Will not experience complications related to urinary retention Outcome: Not Progressing

## 2019-12-21 NOTE — Progress Notes (Signed)
[address]       [place] [address]             [phone]        CARDIOTHORACIC SURGERY PROGRESS NOTE   R2 Days Post-Op Procedure(s) (LRB): CORONARY ARTERY BYPASS GRAFTING (CABG) TIMES  X 5,   USING BILATERAL MAMMARY ARTERY AND RIGHT LEG GREATER SAPHENOUS VEIN HARVESTED ENDOSCOPICALLY (N/A) TRANSESOPHAGEAL ECHOCARDIOGRAM (TEE) (N/A) INDOCYANINE GREEN FLUORESCENCE IMAGING (ICG) (N/A) MAZE (N/A) CLIPPING OF ATRIAL APPENDAGE USING ATRICURE CLIP SIZE 50MM (N/A)  Subjective: Looks much better this morning.  Nausea dramatically improved.  Minimal soreness in chest.  No SOB  Objective: Vital signs: BP Readings from Last 1 Encounters:  12/21/19 95/68   Pulse Readings from Last 1 Encounters:  12/21/19 88   Resp Readings from Last 1 Encounters:  12/21/19 17   Temp Readings from Last 1 Encounters:  12/21/19 99.2 F (37.3 C)    Hemodynamics: PAP: (43-51)/(17-23) 47/18 CVP:  [8 mmHg-18 mmHg] 12 mmHg  Mixed venous co-ox 67%   Physical Exam:  Rhythm:   NSR - AAI paced  Breath sounds: Diminished at bases, otherwise clear  Heart sounds:  RRR  Incisions:  Dressings dry, intact  Abdomen:  Soft, non-distended, non-tender  Extremities:  Warm, well-perfused  Chest tubes:  low volume thin serosanguinous output, no air leak    Intake/Output from previous day: 07/17 0701 - 07/18 0700 In: 1689.6 [P.O.:660; I.V.:829.6; IV Piggyback:200] Out: 9147 [Urine:285; Chest Tube:1100] Intake/Output this shift: Total I/O In: 186 [P.O.:120; I.V.:66] Out: 140 [Urine:20; Chest Tube:120]  Lab Results:  CBC: Recent Labs    12/20/19 1825 12/21/19 0231  WBC 15.6* 18.0*  HGB 8.8* 8.3*  HCT 28.4* 27.1*  PLT 136* 133*    BMET:  Recent Labs    12/20/19 1825 12/21/19 0231  NA 136 135  K 5.8* 5.5*  CL 104 104  CO2 21* 21*  GLUCOSE 199* 152*  BUN 31* 35*  CREATININE 3.57* 4.22*  CALCIUM 7.7* 7.8*     PT/INR:   Recent Labs    12/19/19 1500  LABPROT  16.6*  INR 1.4*    CBG (last 3)  Recent Labs    12/20/19 2352 12/21/19 0337 12/21/19 0633  GLUCAP 139* 134* 133*    ABG    Component Value Date/Time   PHART 7.328 (L) 12/19/2019 2345   PCO2ART 40.6 12/19/2019 2345   PO2ART 72 (L) 12/19/2019 2345   HCO3 21.3 12/19/2019 2345   TCO2 23 12/19/2019 2345   ACIDBASEDEF 4.0 (H) 12/19/2019 2345   O2SAT 66.9 12/21/2019 0236    CXR: LLL atelectasis and slightly elevated diaphragm, otherwise looks good  Assessment/Plan: S/P Procedure(s) (LRB): CORONARY ARTERY BYPASS GRAFTING (CABG) TIMES  X 5,   USING BILATERAL MAMMARY ARTERY AND RIGHT LEG GREATER SAPHENOUS VEIN HARVESTED ENDOSCOPICALLY (N/A) TRANSESOPHAGEAL ECHOCARDIOGRAM (TEE) (N/A) INDOCYANINE GREEN FLUORESCENCE IMAGING (ICG) (N/A) MAZE (N/A) CLIPPING OF ATRIAL APPENDAGE USING ATRICURE CLIP SIZE 50MM (N/A)  Maintaining NSR/AAI paced rhythm w/ stable hemodynamics and co-ox 67% back on milrinone 0.5 and low dose Epi drips Breathing comfortably w/ O2 sats 96-97% on 4 L/min, CXR looks okay Acute oliguric renal failure, minimal response to IV lasix, creatinine up 4.2 Acute on chronic systolic CHF with expected post-op volume excess, weight 7 kg > preop Expected post op acute blood loss anemia, stable Expected post op atelectasis, L>R Type II diabetes mellitus, excellent glycemic control S/P OLT in remote past on tacrolimus   Continue  milrinone, low dose Epi, amiodarone drips  Stop metoprolol  Check f/u ECHO  Consult Nephrology  D/C mediastinal chest tubes and leave pleural tubes in place  Mobilize  Rexene Alberts, MD 12/21/2019 9:51 AM

## 2019-12-21 NOTE — Consult Note (Signed)
Joseph Greer Admit Date: 12/11/2019 12/21/2019 Rexene Agent Requesting Physician:  [name] MD  Reason for Consult:  AKI on CKD3 HPI:  52M presented on 7/8 with NSTEMI and atrial fibrillation with RVR.  Has a past history of liver transplant in 2009 maintained on tacrolimus and followed by [facility], CKD 3 with baseline creatinine 1.2-1.5, hypertension, hyperlipidemia.  He underwent left heart catheterization on 7/9 with 75 mL of IV contrast.  He subsequently underwent 5 vessel CABG, Maze procedure, left atrial appendage clipping on 7/16.  Creatinine was 2.1-2.3 prior to his bypass; was 1.9 prior to his cardiac catheterization.  In the 48 hours after CABG creatinine has increased to 4.2.  Urine output yesterday 5 0.3 L despite 40 IV Lasix.  This morning potassium was elevated at 5.5, and he has received Veltassa.  No other identified contrast exposure.  Remains on milrinone and epinephrine.  No NSAID exposures.  No renal imaging.  Urine analysis on 7/15 without proteinuria or hematuria.   Creatinine, Ser (mg/dL)  Date Value  12/21/2019 4.22 (H)  12/20/2019 3.57 (H)  12/20/2019 2.58 (H)  12/19/2019 2.30 (H)  12/19/2019 2.00 (H)  12/19/2019 1.90 (H)  12/19/2019 2.10 (H)  12/19/2019 2.10 (H)  12/19/2019 2.20 (H)  12/19/2019 2.20 (H)  ] I/Os: I/O last 3 completed shifts: In: 3206.8 [P.O.:930; I.V.:1714.9; IV Piggyback:561.9] Out: 2620 [Urine:630; Chest Tube:1990]   ROS Balance of 12 systems is negative w/ exceptions as above  PMH  Past Medical History:  Diagnosis Date  . Anxiety   . CKD (chronic kidney disease), stage III   . Depression   . Diabetes mellitus type 2, noninsulin dependent ([place])   . Hepatitis C   . Hypertension   . Psoriasis   . Status post liver transplant ([place])    [place]  Past Surgical History:  Procedure Laterality Date  . COLONOSCOPY    . COLONOSCOPY WITH PROPOFOL N/A 06/01/2017   Procedure: COLONOSCOPY WITH PROPOFOL;  Surgeon: Rogene Houston, MD;   [facility];  Service: Endoscopy;  Laterality: N/A;  7:30  . HERNIA REPAIR Right   . LEFT HEART CATH AND CORONARY ANGIOGRAPHY N/A 12/12/2019   Procedure: LEFT HEART CATH AND CORONARY ANGIOGRAPHY;  Surgeon: Jettie Booze, MD;  Location: [facility];  Service: Cardiovascular;  Laterality: N/A;  . LIVER BIOPSY    . LIVER SURGERY    . POLYPECTOMY  06/01/2017   Procedure: POLYPECTOMY;  Surgeon: Rogene Houston, MD;  Location: [facility];  Service: Endoscopy;;  polyp at sigmoid colon x2, ascending colon polyp, hepatic flexure polyp  . RIGHT HEART CATH N/A 12/17/2019   Procedure: RIGHT HEART CATH;  Surgeon: Larey Dresser, MD;  Location: [facility];  Service: Cardiovascular;  Laterality: N/A;  . UPPER GASTROINTESTINAL ENDOSCOPY     FH  Family History  Problem Relation Age of Onset  . Diabetes Mother   . Heart disease Father   . Heart disease Son    [place]  reports that he quit smoking about 12 years ago. His smoking use included cigarettes. He smoked 1.00 pack per day. He has never used smokeless tobacco. He reports that he does not drink alcohol and does not use drugs. Allergies No Known Allergies Home medications Prior to Admission medications   Medication Sig Start Date End Date Taking? Authorizing Provider  amLODipine (NORVASC) 5 MG tablet Take 5 mg by mouth daily. 10/28/19  Yes [provider]  Apremilast (OTEZLA) 30 MG  TABS Take 1 tablet by mouth 2 (two) times daily. 06/16/19  Yes [provider]  LORazepam (ATIVAN) 1 MG tablet Take 1 mg by mouth 2 (two) times daily as needed (panic attacks).  11/28/19  Yes [provider]  nitroGLYCERIN (NITROSTAT) 0.4 MG SL tablet Place 1 tablet (0.4 mg total) under the tongue every 5 (five) minutes x 3 doses as needed for chest pain. 03/27/13  Yes Rai, Ripudeep K, MD  tacrolimus (PROGRAF) 1 MG capsule Take 2 mg by mouth 2 (two) times daily.    Yes [provider]  venlafaxine XR  (EFFEXOR-XR) 75 MG 24 hr capsule Take 75 mg by mouth daily.   Yes [provider]  buPROPion (WELLBUTRIN) 75 MG tablet Take 75 mg by mouth 2 (two) times daily.  Patient not taking: Reported on 12/11/2019    [provider]  FLUoxetine (PROZAC) 40 MG capsule Take 40 mg by mouth daily. Patient not taking: Reported on 12/11/2019    [provider]  lisinopril (PRINIVIL,ZESTRIL) 20 MG tablet Take 20 mg by mouth daily. Patient not taking: Reported on 12/11/2019    [provider]  mycophenolate (CELLCEPT) 250 MG capsule Take 250 mg by mouth 2 (two) times daily.  Patient not taking: Reported on 12/11/2019    [provider]  triamcinolone ointment (KENALOG) 0.1 % Apply 1 application topically 2 (two) times daily. Patient not taking: Reported on 12/11/2019    [provider]    Current Medications Scheduled Meds: . acetaminophen  1,000 mg Oral Q6H  . Apremilast  1 tablet Oral BID  . aspirin EC  325 mg Oral Daily  . [START ON 12/22/2019] atorvastatin  80 mg Oral Daily  . bisacodyl  10 mg Oral Daily   Or  . bisacodyl  10 mg Rectal Daily  . chlorhexidine gluconate (MEDLINE KIT)  15 mL Mouth Rinse BID  . Chlorhexidine Gluconate Cloth  6 each Topical Daily  . docusate sodium  200 mg Oral Daily  . enoxaparin (LOVENOX) injection  30 mg Subcutaneous QHS  . insulin aspart  0-24 Units Subcutaneous Q4H  . pantoprazole  40 mg Oral Daily  . patiromer  8.4 g Oral Daily  . sodium chloride flush  10-40 mL Intracatheter Q12H  . sodium chloride flush  3 mL Intravenous Q12H  . tacrolimus  2 mg Oral BID  . venlafaxine XR  75 mg Oral Daily   Continuous Infusions: . sodium chloride Stopped (12/21/19 0641)  . amiodarone 30 mg/hr (12/21/19 1200)  . epinephrine 1 mcg/min (12/21/19 1200)  . lactated ringers Stopped (12/19/19 1448)  . lactated ringers Stopped (12/20/19 1152)  . milrinone 0.5 mcg/kg/min (12/21/19 1200)  . phenylephrine (NEO-SYNEPHRINE) Adult infusion  Stopped (12/20/19 0818)   PRN Meds:.metoprolol tartrate, morphine injection, ondansetron (ZOFRAN) IV, oxyCODONE, promethazine, sodium chloride flush, sodium chloride flush, traMADol  CBC Recent Labs  Lab 12/20/19 0408 12/20/19 1825 12/21/19 0231  WBC 16.7* 15.6* 18.0*  HGB 8.9* 8.8* 8.3*  HCT 29.2* 28.4* 27.1*  MCV 84.6 86.1 86.6  PLT 168 136* 017*   Basic Metabolic Panel Recent Labs  Lab 12/17/19 0432 12/17/19 0915 12/18/19 0500 12/18/19 0500 12/19/19 0500 12/19/19 0806 12/19/19 1208 12/19/19 1235 12/19/19 1307 12/19/19 1307 12/19/19 1337 12/19/19 1502 12/19/19 2008 12/19/19 2059 12/19/19 2209 12/19/19 2345 12/20/19 0408 12/20/19 1825 12/21/19 0231  NA 139   < > 140   < > 140   < > 142   < > 138   < >  141   < > 143 144 143 144 140 136 135  K 3.4*   < > 3.6   < > 4.0   < > 4.0   < > 5.0   < > 3.8   < > 3.8 3.8 3.8 4.1 4.7 5.8* 5.5*  CL 104  --  103   < > 105   < > 104  --  102  --  104  --  111  --   --   --  109 104 104  CO2 24  --  25  --  25  --   --   --   --   --   --   --  20*  --   --   --  20* 21* 21*  GLUCOSE 111*  --  112*   < > 115*   < > 142*  --  131*  --  168*  --  178*  --   --   --  136* 199* 152*  BUN 27*  --  25*   < > 24*   < > 24*  --  25*  --  25*  --  20  --   --   --  24* 31* 35*  CREATININE 2.26*  --  2.13*   < > 2.31*   < > 2.10*  --  1.90*  --  2.00*  --  2.30*  --   --   --  2.58* 3.57* 4.22*  CALCIUM 8.3*  --  8.6*  --  8.8*  --   --   --   --   --   --   --  7.9*  --   --   --  7.9* 7.7* 7.8*   < > = values in this interval not displayed.    Physical Exam  Blood pressure (!) 129/57, pulse 89, temperature 97.6 F (36.4 C), resp. rate (!) 21, height 5' 10" (1.778 m), weight 91.9 kg, SpO2 95 %. GEN: NAD, sitting up in bed, conversant ENT: NCAT EYES: EOMI CV: Regular, normal S1-S2, a little bit of rub present PULM: CTA B ABD: Soft SKIN: No rashes or lesions  EXT: Bilateral 1+ edema NEURO: nonfocal AAO x3   Assessment 35M with  oliguric AoCKD3 in setting of LHF 7/9 and 5V CABG 7/16 + MAZE.  Suspect this is ATN following cardiac surgery  1. AoCKD3, BL SCr 1.2 to 1.5, no outpt nephrology; suspect ATN; no uremia but poor urine output and progressive hyperkalemia suggest will require CRRT in the next 24 to 48 hours 2. NSTEMI status post 5 vessel CABG 7/16 3. Atrial fibrillation status post maze with left atrial appendage clipping 7/16 4. Status post liver transplant 2009 on tacrolimus followed by [facility] 5. Hypertension 6. DM2  Plan 1. Trial of Lasix 120 mg IV now 2. Check BMP this afternoon 3. Renal ultrasound 4. Follow closely, anticipate potential CRRT in the next 1 to 2 days 5. Does not have HD catheter at current time 6. Daily weights, Daily Renal Panel, Strict I/Os, Avoid nephrotoxins (NSAIDs, judicious IV Contrast)    Rexene Agent  12/21/2019, 12:56 PM

## 2019-12-21 NOTE — Progress Notes (Signed)
MEDICATION RELATED CONSULT NOTE - FOLLOW UP  Patient is s/p liver transplant in 2009 (followed at [facility]) on tacrolimus.  Patient was started on amiodarone 200mg  BID on 7/12 after having runs of PACs. Dose was increased to 400mg  BID on 7/14, and then changed to amiodarone drip post-op due to severe nausea.  Tacrolimus levels drawn on 7/14 and 7/15 were not true trough levels and drawn more as a peak level.  Goal: Tacrolimus level 2-3 (from [employer] notes)  Plan: Check tacrolimus trough level today before 1000 dose   Richardine Service, PharmD PGY2 Cardiology Pharmacy Resident Phone: [phone] 12/21/2019  2:34 PM  Please check [email or web address] for unit-specific pharmacy phone numbers.

## 2019-12-22 ENCOUNTER — Inpatient Hospital Stay (HOSPITAL_COMMUNITY): Payer: Medicare HMO

## 2019-12-22 ENCOUNTER — Encounter (HOSPITAL_COMMUNITY): Payer: Self-pay | Admitting: Cardiothoracic Surgery

## 2019-12-22 DIAGNOSIS — R579 Shock, unspecified: Secondary | ICD-10-CM

## 2019-12-22 DIAGNOSIS — E440 Moderate protein-calorie malnutrition: Secondary | ICD-10-CM | POA: Insufficient documentation

## 2019-12-22 LAB — TYPE AND SCREEN
ABO/RH(D): O POS
Antibody Screen: NEGATIVE
Unit division: 0
Unit division: 0
Unit division: 0
Unit division: 0

## 2019-12-22 LAB — BASIC METABOLIC PANEL
Anion gap: 14 (ref 5–15)
BUN: 51 mg/dL — ABNORMAL HIGH (ref 8–23)
CO2: 19 mmol/L — ABNORMAL LOW (ref 22–32)
Calcium: 7.6 mg/dL — ABNORMAL LOW (ref 8.9–10.3)
Chloride: 98 mmol/L (ref 98–111)
Creatinine, Ser: 5.85 mg/dL — ABNORMAL HIGH (ref 0.61–1.24)
GFR calc Af Amer: 11 mL/min — ABNORMAL LOW (ref >60)
GFR calc non Af Amer: 9 mL/min — ABNORMAL LOW (ref >60)
Glucose, Bld: 134 mg/dL — ABNORMAL HIGH (ref 70–99)
Potassium: 4.8 mmol/L (ref 3.5–5.1)
Sodium: 131 mmol/L — ABNORMAL LOW (ref 135–145)

## 2019-12-22 LAB — HEPATIC FUNCTION PANEL
ALT: 20 U/L (ref 0–44)
AST: 31 U/L (ref 15–41)
Albumin: 2.3 g/dL — ABNORMAL LOW (ref 3.5–5.0)
Alkaline Phosphatase: 66 U/L (ref 38–126)
Bilirubin, Direct: 0.1 mg/dL (ref 0.0–0.2)
Indirect Bilirubin: 0.8 mg/dL (ref 0.3–0.9)
Total Bilirubin: 0.9 mg/dL (ref 0.3–1.2)
Total Protein: 5.4 g/dL — ABNORMAL LOW (ref 6.5–8.1)

## 2019-12-22 LAB — CBC
HCT: 24.1 % — ABNORMAL LOW (ref 39.0–52.0)
HCT: 21.5 % — ABNORMAL LOW (ref 39.0–52.0)
Hemoglobin: 7.6 g/dL — ABNORMAL LOW (ref 13.0–17.0)
Hemoglobin: 6.6 g/dL — CL (ref 13.0–17.0)
MCH: 26.9 pg (ref 26.0–34.0)
MCH: 25.9 pg — ABNORMAL LOW (ref 26.0–34.0)
MCHC: 31.5 g/dL (ref 30.0–36.0)
MCHC: 30.7 g/dL (ref 30.0–36.0)
MCV: 85.2 fL (ref 80.0–100.0)
MCV: 84.3 fL (ref 80.0–100.0)
Platelets: 163 10*3/uL (ref 150–400)
Platelets: 158 10*3/uL (ref 150–400)
RBC: 2.83 MIL/uL — ABNORMAL LOW (ref 4.22–5.81)
RBC: 2.55 MIL/uL — ABNORMAL LOW (ref 4.22–5.81)
RDW: 15.5 % (ref 11.5–15.5)
RDW: 15.6 % — ABNORMAL HIGH (ref 11.5–15.5)
WBC: 20.5 10*3/uL — ABNORMAL HIGH (ref 4.0–10.5)
WBC: 16.4 10*3/uL — ABNORMAL HIGH (ref 4.0–10.5)
nRBC: 0.1 % (ref 0.0–0.2)
nRBC: 0.2 % (ref 0.0–0.2)

## 2019-12-22 LAB — RENAL FUNCTION PANEL
Albumin: 2.4 g/dL — ABNORMAL LOW (ref 3.5–5.0)
Anion gap: 16 — ABNORMAL HIGH (ref 5–15)
BUN: 55 mg/dL — ABNORMAL HIGH (ref 8–23)
CO2: 17 mmol/L — ABNORMAL LOW (ref 22–32)
Calcium: 7.9 mg/dL — ABNORMAL LOW (ref 8.9–10.3)
Chloride: 99 mmol/L (ref 98–111)
Creatinine, Ser: 6.46 mg/dL — ABNORMAL HIGH (ref 0.61–1.24)
GFR calc Af Amer: 10 mL/min — ABNORMAL LOW (ref >60)
GFR calc non Af Amer: 8 mL/min — ABNORMAL LOW (ref >60)
Glucose, Bld: 128 mg/dL — ABNORMAL HIGH (ref 70–99)
Phosphorus: 6.9 mg/dL — ABNORMAL HIGH (ref 2.5–4.6)
Potassium: 4.9 mmol/L (ref 3.5–5.1)
Sodium: 132 mmol/L — ABNORMAL LOW (ref 135–145)

## 2019-12-22 LAB — GLUCOSE, CAPILLARY
Glucose-Capillary: 120 mg/dL — ABNORMAL HIGH (ref 70–99)
Glucose-Capillary: 129 mg/dL — ABNORMAL HIGH (ref 70–99)
Glucose-Capillary: 132 mg/dL — ABNORMAL HIGH (ref 70–99)
Glucose-Capillary: 137 mg/dL — ABNORMAL HIGH (ref 70–99)
Glucose-Capillary: 173 mg/dL — ABNORMAL HIGH (ref 70–99)

## 2019-12-22 LAB — BPAM RBC
Blood Product Expiration Date: 202108192359
Blood Product Expiration Date: 202108192359
Blood Product Expiration Date: 202108192359
Blood Product Expiration Date: 202108192359
Unit Type and Rh: 5100
Unit Type and Rh: 5100
Unit Type and Rh: 5100
Unit Type and Rh: 5100

## 2019-12-22 LAB — POCT I-STAT 7, (LYTES, BLD GAS, ICA,H+H)
Acid-base deficit: 8 mmol/L — ABNORMAL HIGH (ref 0.0–2.0)
Bicarbonate: 18.3 mmol/L — ABNORMAL LOW (ref 20.0–28.0)
Calcium, Ion: 1.07 mmol/L — ABNORMAL LOW (ref 1.15–1.40)
HCT: 21 % — ABNORMAL LOW (ref 39.0–52.0)
Hemoglobin: 7.1 g/dL — ABNORMAL LOW (ref 13.0–17.0)
O2 Saturation: 69 %
Patient temperature: 98.3
Potassium: 4.6 mmol/L (ref 3.5–5.1)
Sodium: 132 mmol/L — ABNORMAL LOW (ref 135–145)
TCO2: 20 mmol/L — ABNORMAL LOW (ref 22–32)
pCO2 arterial: 42.6 mmHg (ref 32.0–48.0)
pH, Arterial: 7.241 — ABNORMAL LOW (ref 7.350–7.450)
pO2, Arterial: 42 mmHg — ABNORMAL LOW (ref 83.0–108.0)

## 2019-12-22 LAB — COOXEMETRY PANEL
Carboxyhemoglobin: 1.3 % (ref 0.5–1.5)
Methemoglobin: 1.3 % (ref 0.0–1.5)
O2 Saturation: 57.6 %
Total hemoglobin: 8 g/dL — ABNORMAL LOW (ref 12.0–16.0)

## 2019-12-22 LAB — PREPARE RBC (CROSSMATCH)

## 2019-12-22 LAB — MAGNESIUM: Magnesium: 2.2 mg/dL (ref 1.7–2.4)

## 2019-12-22 LAB — LACTIC ACID, PLASMA
Lactic Acid, Venous: 4.9 mmol/L (ref 0.5–1.9)
Lactic Acid, Venous: 2.8 mmol/L (ref 0.5–1.9)

## 2019-12-22 LAB — HEMOGLOBIN AND HEMATOCRIT, BLOOD
HCT: 21.9 % — ABNORMAL LOW (ref 39.0–52.0)
Hemoglobin: 6.7 g/dL — CL (ref 13.0–17.0)

## 2019-12-22 LAB — PHOSPHORUS: Phosphorus: 7.3 mg/dL — ABNORMAL HIGH (ref 2.5–4.6)

## 2019-12-22 MED ORDER — SODIUM CHLORIDE 0.9 % IV SOLN
2.0000 g | Freq: Two times a day (BID) | INTRAVENOUS | Status: DC
  Administered 2019-12-22 – 2019-12-23 (×4): 2 g via INTRAVENOUS
  Filled 2019-12-22 (×6): qty 2

## 2019-12-22 MED ORDER — APREMILAST 30 MG PO TABS
1.0000 | ORAL_TABLET | Freq: Every day | ORAL | Status: DC
  Administered 2019-12-22 – 2020-01-01 (×11): 30 mg via ORAL
  Filled 2019-12-22 (×10): qty 1

## 2019-12-22 MED ORDER — SODIUM BICARBONATE 8.4 % IV SOLN
INTRAVENOUS | Status: AC
  Filled 2019-12-22: qty 50

## 2019-12-22 MED ORDER — VANCOMYCIN HCL IN DEXTROSE 1-5 GM/200ML-% IV SOLN
1000.0000 mg | INTRAVENOUS | Status: DC
  Administered 2019-12-23: 1000 mg via INTRAVENOUS
  Filled 2019-12-22: qty 200

## 2019-12-22 MED ORDER — APREMILAST 30 MG PO TABS: 1.0000 | ORAL_TABLET | Freq: Every day | ORAL | Status: DC

## 2019-12-22 MED ORDER — CALCIUM CHLORIDE 10 % IV SOLN: 2.0000 g | Freq: Once | INTRAVENOUS | Status: AC

## 2019-12-22 MED ORDER — HEPARIN SODIUM (PORCINE) 1000 UNIT/ML DIALYSIS
1000.0000 [IU] | INTRAMUSCULAR | Status: DC | PRN
  Administered 2019-12-24: 2800 [IU] via INTRAVENOUS_CENTRAL
  Administered 2019-12-26: 1000 [IU] via INTRAVENOUS_CENTRAL
  Filled 2019-12-22 (×2): qty 6
  Filled 2019-12-22: qty 3
  Filled 2019-12-22: qty 6

## 2019-12-22 MED ORDER — PROSOURCE TF PO LIQD
45.0000 mL | Freq: Two times a day (BID) | ORAL | Status: DC
  Administered 2019-12-22 – 2019-12-27 (×10): 45 mL
  Filled 2019-12-22 (×10): qty 45

## 2019-12-22 MED ORDER — SODIUM CHLORIDE 0.9% IV SOLUTION: Freq: Once | INTRAVENOUS | Status: AC

## 2019-12-22 MED ORDER — B COMPLEX-C PO TABS
1.0000 | ORAL_TABLET | Freq: Every day | ORAL | Status: DC
  Administered 2019-12-22 – 2019-12-29 (×8): 1
  Filled 2019-12-22 (×8): qty 1

## 2019-12-22 MED ORDER — NOREPINEPHRINE 4 MG/250ML-% IV SOLN
INTRAVENOUS | Status: AC
  Administered 2019-12-22: 20 ug/min via INTRAVENOUS
  Filled 2019-12-22: qty 250

## 2019-12-22 MED ORDER — NOREPINEPHRINE 4 MG/250ML-% IV SOLN
0.0000 ug/min | INTRAVENOUS | Status: DC
  Filled 2019-12-22: qty 250

## 2019-12-22 MED ORDER — SODIUM BICARBONATE 8.4 % IV SOLN
INTRAVENOUS | Status: AC
  Administered 2019-12-22: 100 meq via INTRAVENOUS
  Filled 2019-12-22: qty 50

## 2019-12-22 MED ORDER — DOBUTAMINE IN D5W 4-5 MG/ML-% IV SOLN
2.5000 ug/kg/min | INTRAVENOUS | Status: DC
  Administered 2019-12-22: 5 ug/kg/min via INTRAVENOUS
  Filled 2019-12-22: qty 250

## 2019-12-22 MED ORDER — CALCIUM CHLORIDE 10 % IV SOLN
INTRAVENOUS | Status: AC
  Administered 2019-12-22: 2 g via INTRAVENOUS
  Filled 2019-12-22: qty 10

## 2019-12-22 MED ORDER — CALCIUM CHLORIDE 10 % IV SOLN
INTRAVENOUS | Status: AC
  Filled 2019-12-22: qty 10

## 2019-12-22 MED ORDER — PRISMASOL BGK 4/2.5 32-4-2.5 MEQ/L REPLACEMENT SOLN
Status: DC
  Filled 2019-12-22: qty 5000

## 2019-12-22 MED ORDER — VANCOMYCIN HCL 2000 MG/400ML IV SOLN
2000.0000 mg | Freq: Once | INTRAVENOUS | Status: AC
  Administered 2019-12-22: 2000 mg via INTRAVENOUS
  Filled 2019-12-22: qty 400

## 2019-12-22 MED ORDER — ALBUMIN HUMAN 5 % IV SOLN: 12.5000 g | Freq: Once | INTRAVENOUS | Status: AC

## 2019-12-22 MED ORDER — ASPIRIN EC 81 MG PO TBEC
81.0000 mg | DELAYED_RELEASE_TABLET | Freq: Every day | ORAL | Status: DC
  Administered 2019-12-22 – 2020-01-01 (×11): 81 mg via ORAL
  Filled 2019-12-22 (×11): qty 1

## 2019-12-22 MED ORDER — ALBUMIN HUMAN 5 % IV SOLN
INTRAVENOUS | Status: AC
  Administered 2019-12-22: 12.5 g via INTRAVENOUS
  Filled 2019-12-22: qty 250

## 2019-12-22 MED ORDER — SODIUM BICARBONATE 8.4 % IV SOLN: 100.0000 meq | Freq: Once | INTRAVENOUS | Status: AC

## 2019-12-22 MED ORDER — VITAL 1.5 CAL PO LIQD
1000.0000 mL | ORAL | Status: DC
  Administered 2019-12-22 – 2019-12-27 (×5): 1000 mL
  Filled 2019-12-22 (×9): qty 1000

## 2019-12-22 MED ORDER — VENLAFAXINE HCL ER 37.5 MG PO CP24
37.5000 mg | ORAL_CAPSULE | Freq: Every day | ORAL | Status: DC
  Administered 2019-12-23 – 2020-01-01 (×11): 37.5 mg via ORAL
  Filled 2019-12-22 (×10): qty 1

## 2019-12-22 MED ORDER — CLOPIDOGREL BISULFATE 75 MG PO TABS
75.0000 mg | ORAL_TABLET | Freq: Every day | ORAL | Status: DC
  Administered 2019-12-22 – 2019-12-24 (×3): 75 mg via ORAL
  Filled 2019-12-22 (×3): qty 1

## 2019-12-22 MED ORDER — PRISMASOL BGK 4/2.5 32-4-2.5 MEQ/L IV SOLN: INTRAVENOUS | Status: DC

## 2019-12-22 MED ORDER — HYDROCORTISONE NA SUCCINATE PF 100 MG IJ SOLR
100.0000 mg | Freq: Three times a day (TID) | INTRAMUSCULAR | Status: DC
  Administered 2019-12-22 – 2019-12-24 (×6): 100 mg via INTRAVENOUS
  Filled 2019-12-22 (×6): qty 2

## 2019-12-22 MED ORDER — HYDROCORTISONE NA SUCCINATE PF 100 MG IJ SOLR
50.0000 mg | Freq: Three times a day (TID) | INTRAMUSCULAR | Status: DC
  Administered 2019-12-22: 50 mg via INTRAVENOUS
  Filled 2019-12-22: qty 2

## 2019-12-22 MED ORDER — PRISMASOL BGK 4/2.5 32-4-2.5 MEQ/L REPLACEMENT SOLN: Status: DC

## 2019-12-22 MED ORDER — NOREPINEPHRINE 16 MG/250ML-% IV SOLN
0.0000 ug/min | INTRAVENOUS | Status: DC
  Administered 2019-12-22: 25 ug/min via INTRAVENOUS
  Filled 2019-12-22: qty 250

## 2019-12-22 MED ORDER — LORAZEPAM 1 MG PO TABS
1.0000 mg | ORAL_TABLET | Freq: Two times a day (BID) | ORAL | Status: DC | PRN
  Administered 2019-12-22 – 2020-01-01 (×9): 1 mg via ORAL
  Filled 2019-12-22 (×10): qty 1

## 2019-12-22 MED ORDER — VASOPRESSIN 20 UNITS/100 ML INFUSION FOR SHOCK
0.0000 [IU]/min | INTRAVENOUS | Status: DC
  Administered 2019-12-22 – 2019-12-23 (×2): 0.03 [IU]/min via INTRAVENOUS
  Filled 2019-12-22 (×2): qty 100

## 2019-12-22 MED FILL — Potassium Chloride Inj 2 mEq/ML: INTRAVENOUS | Qty: 40 | Status: AC

## 2019-12-22 MED FILL — Magnesium Sulfate Inj 50%: INTRAMUSCULAR | Qty: 10 | Status: AC

## 2019-12-22 MED FILL — Heparin Sodium (Porcine) Inj 1000 Unit/ML: INTRAMUSCULAR | Qty: 30 | Status: AC

## 2019-12-22 NOTE — Progress Notes (Signed)
[facility] DAILY ICU PROGRESS NOTE                   [address]            [place] [address]          [phone]   3 Days Post-Op Procedure(s) (LRB): CORONARY ARTERY BYPASS GRAFTING (CABG) TIMES  X 5,   USING BILATERAL MAMMARY ARTERY AND RIGHT LEG GREATER SAPHENOUS VEIN HARVESTED ENDOSCOPICALLY (N/A) TRANSESOPHAGEAL ECHOCARDIOGRAM (TEE) (N/A) INDOCYANINE GREEN FLUORESCENCE IMAGING (ICG) (N/A) MAZE (N/A) CLIPPING OF ATRIAL APPENDAGE USING ATRICURE CLIP SIZE 50MM (N/A)  Total Length of Stay:  LOS: 10 days   Subjective: Having further hemodynamic difficulty after placing HD catheter/ Plans are for CRRT for AKI on CKD3. - nephrology and CCM and AHF teams assisting with management.  Neo changed to levophed Anion gap metabolic acidosis- getting bicarb and calcium 2/2amps   Objective: Vital signs in last 24 hours: Temp:  [97.7 F (36.5 C)-98.3 F (36.8 C)] 98.3 F (36.8 C) (07/19 1100) Pulse Rate:  [86-113] 100 (07/19 1245) Cardiac Rhythm: Normal sinus rhythm (07/19 0800) Resp:  [13-25] 15 (07/19 1245) BP: (81-133)/(34-110) 81/37 (07/19 1245) SpO2:  [94 %-100 %] 94 % (07/19 1245) Weight:  [91.8 kg] 91.8 kg (07/19 0500)  Filed Weights   12/20/19 0500 12/21/19 0500 12/22/19 0500  Weight: 90.9 kg 91.9 kg 91.8 kg    Weight change: -0.137 kg   Hemodynamic parameters for last 24 hours: CVP:  [8 mmHg-21 mmHg] 14 mmHg  Intake/Output from previous day: 07/18 0701 - 07/19 0700 In: 791.6 [P.O.:360; I.V.:369.7; IV Piggyback:62] Out: 602 [Urine:162; Chest Tube:440]  Intake/Output this shift: Total I/O In: 566.6 [I.V.:556.6; Other:10] Out: 130 [Chest Tube:130]  Current Meds: Scheduled Meds: . acetaminophen  1,000 mg Oral Q6H  . Apremilast  1 tablet Oral Daily  . aspirin EC  325 mg Oral Daily  . atorvastatin  80 mg Oral Daily  . bisacodyl  10 mg Oral Daily   Or  . bisacodyl  10 mg Rectal Daily  . chlorhexidine gluconate (MEDLINE KIT)  15 mL Mouth Rinse BID   . Chlorhexidine Gluconate Cloth  6 each Topical Daily  . docusate sodium  200 mg Oral Daily  . enoxaparin (LOVENOX) injection  30 mg Subcutaneous QHS  . insulin aspart  0-24 Units Subcutaneous Q4H  . pantoprazole  40 mg Oral Daily  . sodium chloride flush  10-40 mL Intracatheter Q12H  . sodium chloride flush  3 mL Intravenous Q12H  . tacrolimus  2 mg Oral BID  . [START ON 12/23/2019] venlafaxine XR  37.5 mg Oral Daily   Continuous Infusions: .  prismasol BGK 4/2.5    .  prismasol BGK 4/2.5    . sodium chloride Stopped (12/21/19 1602)  . amiodarone 30 mg/hr (12/22/19 1100)  . ceFEPime (MAXIPIME) IV    . DOBUTamine 5 mcg/kg/min (12/22/19 1130)  . epinephrine 1 mcg/min (12/22/19 1100)  . lactated ringers Stopped (12/19/19 1448)  . lactated ringers Stopped (12/20/19 1152)  . norepinephrine (LEVOPHED) Adult infusion 20 mcg/min (12/22/19 1349)  . phenylephrine (NEO-SYNEPHRINE) Adult infusion Stopped (12/22/19 1348)  . prismasol BGK 4/2.5    . vancomycin     Followed by  . [START ON 12/23/2019] vancomycin     PRN Meds:.heparin, LORazepam, metoprolol tartrate, morphine injection, ondansetron (ZOFRAN) IV, oxyCODONE, promethazine, senna-docusate, sodium chloride flush, sodium chloride flush, traMADol  General appearance: distracted and no distress Neurologic: somewhat confused, some mild agitation Heart:  regular rate and rhythm and tachy Lungs: clear anteriorly Abdomen: soft, nontender or distended Extremities: no significant edema Wound: incis healing well  Lab Results: CBC: Recent Labs    12/21/19 0231 12/21/19 0231 12/22/19 0341 12/22/19 1411  WBC 18.0*  --  20.5*  --   HGB 8.3*   < > 7.6* 7.1*  HCT 27.1*   < > 24.1* 21.0*  PLT 133*  --  163  --    < > = values in this interval not displayed.   BMET:  Recent Labs    12/22/19 0341 12/22/19 0341 12/22/19 1155 12/22/19 1411  NA 131*   < > 132* 132*  K 4.8   < > 4.9 4.6  CL 98  --  99  --   CO2 19*  --  17*  --    GLUCOSE 134*  --  128*  --   BUN 51*  --  55*  --   CREATININE 5.85*  --  6.46*  --   CALCIUM 7.6*  --  7.9*  --    < > = values in this interval not displayed.    CMET: Lab Results  Component Value Date   WBC 20.5 (H) 12/22/2019   HGB 7.1 (L) 12/22/2019   HCT 21.0 (L) 12/22/2019   PLT 163 12/22/2019   GLUCOSE 128 (H) 12/22/2019   CHOL 219 (H) 12/12/2019   TRIG 117 12/12/2019   HDL 38 (L) 12/12/2019   LDLCALC 158 (H) 12/12/2019   ALT 32 12/18/2019   AST 26 12/18/2019   NA 132 (L) 12/22/2019   K 4.6 12/22/2019   CL 99 12/22/2019   CREATININE 6.46 (H) 12/22/2019   BUN 55 (H) 12/22/2019   CO2 17 (L) 12/22/2019   TSH 4.855 (H) 12/11/2019   INR 1.4 (H) 12/19/2019   HGBA1C 5.9 (H) 12/11/2019      PT/INR:  Recent Labs    12/19/19 1500  LABPROT 16.6*  INR 1.4*   Radiology: No results found.   Assessment/Plan: S/P Procedure(s) (LRB): CORONARY ARTERY BYPASS GRAFTING (CABG) TIMES  X 5,   USING BILATERAL MAMMARY ARTERY AND RIGHT LEG GREATER SAPHENOUS VEIN HARVESTED ENDOSCOPICALLY (N/A) TRANSESOPHAGEAL ECHOCARDIOGRAM (TEE) (N/A) INDOCYANINE GREEN FLUORESCENCE IMAGING (ICG) (N/A) MAZE (N/A) CLIPPING OF ATRIAL APPENDAGE USING ATRICURE CLIP SIZE 50MM (N/A)  1 remains labile from hemodynamic standpoint-  on amio, levo, dobut, levo, epi.. Sinus tachy , has had afib.   2 has dev Anion gap acidosis and worsening renal failure- Plan for CRRT Concern he could have dev some tacrolimus toxicity- will also check LFT's . May need to adjust amiodarone if enzymes are elevated.  PCCM starting stress steroids and stopping Tac. He has received calcium and Bicarb 3 stop phenergan as could be contrib to agitation/confusion 4 Creat 6.46/ BUN 55- for CRRT when able 5 prob should transfuse once on CRRT so can make isovolemic. Will place order for I unit PRBC's 6 no significant effusion on bedside echo per CCM, RV ok and LV show reasonable function 7 mod CT drainage , leave for now 8 BS  control is pretty good, Hg A1c 5.9 preop  John Giovanni 12/22/2019 2:21 PM

## 2019-12-22 NOTE — Progress Notes (Signed)
Initial Nutrition Assessment  DOCUMENTATION CODES:   Non-severe (moderate) malnutrition in context of chronic illness  INTERVENTION:   Tube Feeding via Cortrak:  Vital 1.5 at 60 ml/hr Pro-Source TF 45 mL  Provides 119 g, 2240 kcals and 1094 mL of free water Meets 100% protein and calorie needs  Diet as tolerated  Add B complex with C to replace losses via CRRT   NUTRITION DIAGNOSIS:   Moderate Malnutrition related to chronic illness as evidenced by mild fat depletion, mild muscle depletion.  GOAL:   Patient will meet greater than or equal to 90% of their needs   MONITOR:   Diet advancement, PO intake, TF tolerance, Skin, Weight trends  REASON FOR ASSESSMENT:   Rounds    ASSESSMENT:   64 yo male admitted with NSTEMI and found to have severe biventricular failure and 3V CAD requiring CABG x 5. PMH liver transplant in 2009 at [facility], CKD III, DM, HTN   7/16 CABG x 5  Plan to initiate CRRT today. Pt with hemodynamic instability today post HD cath placement.   Pt does not participate much in exam today, unable to obtain much history today  Pt on CL diet, not tolerating well with significant nausea.   Noted pt is constipated and has not had BM since 7/06. Noted pt with increased bowel regimen on 7/17. Constipation may be contributing to pt's nausea and decreased appetite  Current weight 91.8 kg; admit weight 84 kg. Net + 5 L. No clear weight loss trend per weight encounters  Labs: sodium 132 (L), phosphorus 6.9 (H), Creatinine 6.46, BUN 55 Meds: ss novolog    NUTRITION - FOCUSED PHYSICAL EXAM:    Most Recent Value  Orbital Region Mild depletion  Upper Arm Region Mild depletion  Thoracic and Lumbar Region Mild depletion  Buccal Region Mild depletion  Temple Region Mild depletion  Clavicle Bone Region Mild depletion  Clavicle and Acromion Bone Region Mild depletion  Scapular Bone Region Mild depletion  Dorsal Hand Moderate depletion  Patellar Region Mild  depletion  Anterior Thigh Region Mild depletion  Posterior Calf Region No depletion  Edema (RD Assessment) Mild       Diet Order:   Diet Order            Diet clear liquid Room service appropriate? Yes; Fluid consistency: Thin  Diet effective now                 EDUCATION NEEDS:   Not appropriate for education at this time  Skin:  Skin Assessment: Reviewed RN Assessment  Last BM:  7/06  Height:   Ht Readings from Last 1 Encounters:  12/11/19 5\' 10"  (1.778 m)    Weight:   Wt Readings from Last 1 Encounters:  12/22/19 91.8 kg    BMI:  Body mass index is 29.03 kg/m.  Estimated Nutritional Needs:   Kcal:  2100-2400 kcals  Protein:  110-130 g  Fluid:  >/= 2L   Kerman Passey MS, RDN, LDN, CNSC Registered Dietitian III Clinical Nutrition RD Pager and On-Call Pager Number Located in [place]

## 2019-12-22 NOTE — Progress Notes (Addendum)
Pharmacy Antibiotic Note  Joseph Greer is a 64 y.o. male admitted on 12/11/2019 with severe 3VD and biventricular heart failure. LVEF 20-25%. S/p CABG, MAZE, and LA appendage clipping on 7/16. WBC has been elevated and increasing to 20.5 today. Currently afebrile. Concern for HCAP. Pharmacy has been consulted for vancomycin and cefepime dosing.  Also with AKI on CKD3. Plan to start CRRT today. Will dose antibiotics per CRRT dosing.  Plan: Vancomycin 2,000 mg IV once, then vancomycin 1,000 mg IV every 24 hours.  Goal trough 15-20 mcg/mL. Cefepime 2 g IV q12h Monitor cultures, CRRT tolerability and duration, LOT, and de-escalation of antibiotics  Height: 5\' 10"  (177.8 cm) Weight: 91.8 kg (202 lb 4.8 oz) IBW/kg (Calculated) : 73  Temp (24hrs), Avg:97.7 F (36.5 C), Min:97.6 F (36.4 C), Max:97.8 F (36.6 C)  Recent Labs  Lab 12/19/19 2008 12/19/19 2008 12/20/19 0408 12/20/19 1825 12/21/19 0231 12/21/19 1513 12/22/19 0341  WBC 20.6*  --  16.7* 15.6* 18.0*  --  20.5*  CREATININE 2.30*   < > 2.58* 3.57* 4.22* 4.99* 5.85*   < > = values in this interval not displayed.    Estimated Creatinine Clearance: 14.9 mL/min (A) (by C-G formula based on SCr of 5.85 mg/dL (H)).    No Known Allergies  Antimicrobials this admission: Cefuroxime 7/16>>7/18 Vancomycin 7/16, 7/19>> Cefepime 7/19>>  Dose adjustments this admission: None  Microbiology results: 7/19 BCx: sent 7/19 Sputum: sent  7/13 MRSA PCR: negative  Thank you for allowing pharmacy to be a part of this patients care.  Vertis Kelch, PharmD, BCPS Phone [phone] 12/22/2019       11:23 AM  Please check [email or web address] for unit-specific pharmacist phone numbers

## 2019-12-22 NOTE — Progress Notes (Signed)
Joseph Greer Admit Date: 12/11/2019 12/22/2019 [name] Requesting Physician:  [name] MD  Reason for Consult:  AKI on CKD3 SUBJECTIVE Still on epinephrine and milrinone.  162 cc of urine output in last 24 hours with a total of 160 mg of Lasix yesterday.  Now having nausea and loss of appetite.  Is also endorsing dizziness especially when he stands up.   Creatinine, Ser (mg/dL)  Date Value  12/22/2019 5.85 (H)  12/21/2019 4.99 (H)  12/21/2019 4.22 (H)  12/20/2019 3.57 (H)  12/20/2019 2.58 (H)  12/19/2019 2.30 (H)  12/19/2019 2.00 (H)  12/19/2019 1.90 (H)  12/19/2019 2.10 (H)  12/19/2019 2.10 (H)  ] I/Os: I/O last 3 completed shifts: In: 1720.5 [P.O.:780; I.V.:778.5; IV Piggyback:162] Out: 1207 [Urine:297; Chest Tube:910]   ROS Balance of 12 systems is negative w/ exceptions as above  PMH  Past Medical History:  Diagnosis Date  . Anxiety   . CKD (chronic kidney disease), stage III   . Depression   . Diabetes mellitus type 2, noninsulin dependent ([place])   . Hepatitis C   . Hypertension   . Psoriasis   . Status post liver transplant ([place])    [place]  Past Surgical History:  Procedure Laterality Date  . COLONOSCOPY    . COLONOSCOPY WITH PROPOFOL N/A 06/01/2017   Procedure: COLONOSCOPY WITH PROPOFOL;  Surgeon: Rogene Houston, MD;  Location[facility];  Service: Endoscopy;  Laterality: N/A;  7:30  . HERNIA REPAIR Right   . LEFT HEART CATH AND CORONARY ANGIOGRAPHY N/A 12/12/2019   Procedure: LEFT HEART CATH AND CORONARY ANGIOGRAPHY;  Surgeon: Jettie Booze, MD;  Location: [place] [facility] LAB;  Service: Cardiovascular;  Laterality: N/A;  . LIVER BIOPSY    . LIVER SURGERY    . POLYPECTOMY  06/01/2017   Procedure: POLYPECTOMY;  Surgeon: Rogene Houston, MD;  Location: [facility];  Service: Endoscopy;;  polyp at sigmoid colon x2, ascending colon polyp, hepatic flexure polyp  . RIGHT HEART CATH N/A 12/17/2019   Procedure: RIGHT HEART CATH;  Surgeon:  Larey Dresser, MD;  Location: [place] [facility] LAB;  Service: Cardiovascular;  Laterality: N/A;  . UPPER GASTROINTESTINAL ENDOSCOPY     FH  Family History  Problem Relation Age of Onset  . Diabetes Mother   . Heart disease Father   . Heart disease Son    [place]  reports that he quit smoking about 12 years ago. His smoking use included cigarettes. He smoked 1.00 pack per day. He has never used smokeless tobacco. He reports that he does not drink alcohol and does not use drugs. Allergies No Known Allergies Home medications Prior to Admission medications   Medication Sig Start Date End Date Taking? Authorizing Provider  amLODipine (NORVASC) 5 MG tablet Take 5 mg by mouth daily. 10/28/19  Yes [provider]  Apremilast (OTEZLA) 30 MG TABS Take 1 tablet by mouth 2 (two) times daily. 06/16/19  Yes [provider]  LORazepam (ATIVAN) 1 MG tablet Take 1 mg by mouth 2 (two) times daily as needed (panic attacks).  11/28/19  Yes [provider]  nitroGLYCERIN (NITROSTAT) 0.4 MG SL tablet Place 1 tablet (0.4 mg total) under the tongue every 5 (five) minutes x 3 doses as needed for chest pain. 03/27/13  Yes Rai, Ripudeep K, MD  tacrolimus (PROGRAF) 1 MG capsule Take 2 mg by mouth 2 (two) times daily.    Yes [provider]  venlafaxine XR (EFFEXOR-XR) 75 MG  24 hr capsule Take 75 mg by mouth daily.   Yes [provider]  buPROPion (WELLBUTRIN) 75 MG tablet Take 75 mg by mouth 2 (two) times daily.  Patient not taking: Reported on 12/11/2019    [provider]  FLUoxetine (PROZAC) 40 MG capsule Take 40 mg by mouth daily. Patient not taking: Reported on 12/11/2019    [provider]  lisinopril (PRINIVIL,ZESTRIL) 20 MG tablet Take 20 mg by mouth daily. Patient not taking: Reported on 12/11/2019    [provider]  mycophenolate (CELLCEPT) 250 MG capsule Take 250 mg by mouth 2 (two) times daily.  Patient not taking: Reported on 12/11/2019     [provider]  triamcinolone ointment (KENALOG) 0.1 % Apply 1 application topically 2 (two) times daily. Patient not taking: Reported on 12/11/2019    [provider]    Current Medications Scheduled Meds: . acetaminophen  1,000 mg Oral Q6H  . Apremilast  1 tablet Oral Daily  . aspirin EC  325 mg Oral Daily  . atorvastatin  80 mg Oral Daily  . bisacodyl  10 mg Oral Daily   Or  . bisacodyl  10 mg Rectal Daily  . chlorhexidine gluconate (MEDLINE KIT)  15 mL Mouth Rinse BID  . Chlorhexidine Gluconate Cloth  6 each Topical Daily  . docusate sodium  200 mg Oral Daily  . enoxaparin (LOVENOX) injection  30 mg Subcutaneous QHS  . insulin aspart  0-24 Units Subcutaneous Q4H  . pantoprazole  40 mg Oral Daily  . sodium chloride flush  10-40 mL Intracatheter Q12H  . sodium chloride flush  3 mL Intravenous Q12H  . tacrolimus  2 mg Oral BID  . venlafaxine XR  75 mg Oral Daily   Continuous Infusions: .  prismasol BGK 4/2.5    .  prismasol BGK 4/2.5    . sodium chloride Stopped (12/21/19 1602)  . amiodarone 30 mg/hr (12/22/19 0900)  . epinephrine 1 mcg/min (12/22/19 0900)  . lactated ringers Stopped (12/19/19 1448)  . lactated ringers Stopped (12/20/19 1152)  . milrinone 0.5 mcg/kg/min (12/22/19 0900)  . phenylephrine (NEO-SYNEPHRINE) Adult infusion Stopped (12/20/19 0818)  . prismasol BGK 4/2.5     PRN Meds:.heparin, metoprolol tartrate, morphine injection, ondansetron (ZOFRAN) IV, oxyCODONE, promethazine, senna-docusate, sodium chloride flush, sodium chloride flush, traMADol  CBC Recent Labs  Lab 12/20/19 1825 12/21/19 0231 12/22/19 0341  WBC 15.6* 18.0* 20.5*  HGB 8.8* 8.3* 7.6*  HCT 28.4* 27.1* 24.1*  MCV 86.1 86.6 85.2  PLT 136* 133* 371   Basic Metabolic Panel Recent Labs  Lab 12/19/19 0500 12/19/19 0806 12/19/19 1337 12/19/19 1502 12/19/19 2008 12/19/19 2059 12/19/19 2209 12/19/19 2345 12/20/19 0408 12/20/19 1825 12/21/19 0231 12/21/19 1513  12/22/19 0341  NA 140   < > 141   < > 143   < > 143 144 140 136 135 129* 131*  K 4.0   < > 3.8   < > 3.8   < > 3.8 4.1 4.7 5.8* 5.5* 4.7 4.8  CL 105   < > 104  --  111  --   --   --  109 104 104 99 98  CO2 25  --   --   --  20*  --   --   --  20* 21* 21* 18* 19*  GLUCOSE 115*   < > 168*  --  178*  --   --   --  136* 199* 152* 186* 134*  BUN 24*   < >  25*  --  20  --   --   --  24* 31* 35* 43* 51*  CREATININE 2.31*   < > 2.00*  --  2.30*  --   --   --  2.58* 3.57* 4.22* 4.99* 5.85*  CALCIUM 8.8*  --   --   --  7.9*  --   --   --  7.9* 7.7* 7.8* 7.5* 7.6*   < > = values in this interval not displayed.    Physical Exam  Blood pressure (!) 109/47, pulse (!) 102, temperature 97.8 F (36.6 C), temperature source Oral, resp. rate 20, height _0  (1.778 m), weight 91.8 kg, SpO2 99 %. GEN: NAD, sitting up in chair, conversant HEENT: NCAT, anicteric sclera CV: Regular, normal S1-S2, slightly pericardial rub PULM: Diminished air entry left greater than right base with crackles and rales, slightly increased work of breathing, bilateral chest expansion ABD: Soft, nontender nondistended SKIN: No rashes or lesions  EXT: Bilateral 1+ edema NEURO: nonfocal AAO x3, speech clear and coherent   Assessment 2M with oliguric AoCKD3 in setting of LHF 7/9 and 5V CABG 7/16 + MAZE.  Suspect this is ATN following cardiac surgery  1. AoCKD3 (oligo-anuric), BL SCr 1.2 to 1.5, no outpt nephrology; suspect ATN; exhibiting early uremic symptoms with loss of appetite along with nausea/vomiting (Cr continues to rise).  No increase in urine output despite a total of 160 mg of Lasix yesterday. 2. NSTEMI status post 5 vessel CABG 7/16 3. Atrial fibrillation status post maze with left atrial appendage clipping 7/16.  On amiodarone 4. Acute on chronic sCHF/cardiogenic shock on epinephrine and milrinone 5. Status post liver transplant 2009 ([facility]) on tacrolimus followed by [facility] 6. Hypertension 7. DM2  Plan 1. Given  early uremic symptoms along with no improvement in urine output with diuretic trial, will start CRRT on him today 2. CRRT labs 3. Temporary dialysis catheter to be placed by CCM 4. Will continue to monitor for renal recovery moving forward 5. Renal ultrasound reviewed 6. Daily weights, Strict I/Os, Avoid nephrotoxins (NSAIDs, judicious IV Contrast)   Discussed with primary service.  [name], MD [facility] 12/22/2019, 10:29 AM

## 2019-12-22 NOTE — Procedures (Signed)
Central Venous Catheter Insertion Procedure Note  Line type:  Triple Lumen HD  Indications: Hemodialysis, frequent blood draws and administration of medication  Procedure Details:  Informed consent was obtained after explanation of the risks and benefits of the procedure, refer to the consent documentation.  Time-out was performed immediately prior to the procedure.  The left internal jugular vein was identified using bedside ultrasound. This area was prepped and draped in the usual sterile fashion. Maximum sterile technique was used including antiseptics, cap, gloves, gown, hand hygiene, mask, and sterile sheet.   Local anesthesia with 1% lidocaine was applied subcutaneously then deep to the skin.The patient was placed in Trendelenburgs position. The needle was then inserted into the vein using ultrasound guidance.  Using the Seldinger Technique a triple lumen HD line was placed with each port easily flushed and freely drawing venous blood.  The catheter was secured with sutures. A sterile bandage was placed over the site.  Condition: The patient tolerated the procedure well and remains in the same condition as pre-procedure.  Complications: None; patient tolerated the procedure well.  Plan: CXR was ordered to verify placement.  Tamsen Snider, MD PGY2 Internal Medicine

## 2019-12-22 NOTE — Consult Note (Signed)
NAME:  Joseph Greer, MRN:  [id number], DOB:  [birth year], LOS: 57 ADMISSION DATE:  12/11/2019, CONSULTATION DATE:  12/22/2019 REFERRING MD:  Orvan Seen, CHIEF COMPLAINT:  shock   HPI/course in hospital  64 year old man who was admitted for NSTEMI and was found to have severe biventricular failure and 3V CAD.  EF was 25%  Prior liver transplant at [facility] 2009.   7/16 - CABGx5 with Maze for Afib.   Post operative course complicated by persistent LV dysfunction and AKI.   Plan to initiate CRRT today .   Past Medical History   Past Medical History:  Diagnosis Date  . Anxiety   . CKD (chronic kidney disease), stage III   . Depression   . Diabetes mellitus type 2, noninsulin dependent ([place])   . Hepatitis C   . Hypertension   . Psoriasis   . Status post liver transplant [facility])      Past Surgical History:  Procedure Laterality Date  . COLONOSCOPY    . COLONOSCOPY WITH PROPOFOL N/A 06/01/2017   Procedure: COLONOSCOPY WITH PROPOFOL;  Surgeon: Rogene Houston, MD;  [facility];  Service: Endoscopy;  Laterality: N/A;  7:30  . HERNIA REPAIR Right   . LEFT HEART CATH AND CORONARY ANGIOGRAPHY N/A 12/12/2019   Procedure: LEFT HEART CATH AND CORONARY ANGIOGRAPHY;  Surgeon: Jettie Booze, MD;  Location: [place] CV LAB;  Service: Cardiovascular;  Laterality: N/A;  . LIVER BIOPSY    . LIVER SURGERY    . POLYPECTOMY  06/01/2017   Procedure: POLYPECTOMY;  Surgeon: Rogene Houston, MD;  Location: [facility];  Service: Endoscopy;;  polyp at sigmoid colon x2, ascending colon polyp, hepatic flexure polyp  . RIGHT HEART CATH N/A 12/17/2019   Procedure: RIGHT HEART CATH;  Surgeon: Larey Dresser, MD;  Location: [place] CV LAB;  Service: Cardiovascular;  Laterality: N/A;  . UPPER GASTROINTESTINAL ENDOSCOPY        Interim history/subjective:  Complaining of nausea particularly when recumbent.  Was up to chair this morning but more hypotensive this afternoon.   Increased tremulousness.   Objective   Blood pressure (!) 81/37, pulse 100, temperature 98.3 F (36.8 C), temperature source Oral, resp. rate 15, height 5\' 10"  (1.778 m), weight 91.8 kg, SpO2 94 %. CVP:  [8 mmHg-21 mmHg] 14 mmHg      Intake/Output Summary (Last 24 hours) at 12/22/2019 1459 Last data filed at 12/22/2019 1100 Gross per 24 hour  Intake 886.78 ml  Output 512 ml  Net 374.78 ml   Filed Weights   12/20/19 0500 12/21/19 0500 12/22/19 0500  Weight: 90.9 kg 91.9 kg 91.8 kg    Examination: Physical Exam Constitutional:      General: He is in acute distress.     Appearance: He is ill-appearing and diaphoretic.  HENT:     Head: Normocephalic and atraumatic.     Mouth/Throat:     Mouth: Mucous membranes are moist.  Eyes:     Pupils: Pupils are equal, round, and reactive to light.  Cardiovascular:     Rate and Rhythm: Regular rhythm. Tachycardia present.     Heart sounds: No murmur heard.  No friction rub.  Pulmonary:     Breath sounds: Normal breath sounds.  Abdominal:     General: Abdomen is flat.     Palpations: Abdomen is soft.  Skin:    Capillary Refill: Capillary refill takes more than 3 seconds.     Coloration: Skin  is pale.  Neurological:     General: No focal deficit present.     Mental Status: He is disoriented.    I performed a bedside echo on 7/19  Showing LV moderately reduced with EF 30-40%. RV function mild reduced. . No tamponnade  Ancillary tests (personally reviewed)  CBC: Recent Labs  Lab 12/19/19 2008 12/19/19 2059 12/20/19 0408 12/20/19 1825 12/21/19 0231 12/22/19 0341 12/22/19 1411  WBC 20.6*  --  16.7* 15.6* 18.0* 20.5*  --   HGB 9.1*   < > 8.9* 8.8* 8.3* 7.6* 7.1*  HCT 29.4*   < > 29.2* 28.4* 27.1* 24.1* 21.0*  MCV 85.2  --  84.6 86.1 86.6 85.2  --   PLT 184  --  168 136* 133* 163  --    < > = values in this interval not displayed.    Basic Metabolic Panel: Recent Labs  Lab 12/16/19 0444 12/17/19 0432 12/18/19 0500  12/19/19 0500 12/19/19 2008 12/19/19 2059 12/20/19 0408 12/20/19 0408 12/20/19 1825 12/20/19 1825 12/21/19 0231 12/21/19 1513 12/22/19 0341 12/22/19 1155 12/22/19 1411  NA 140   < > 140   < > 143   < > 140   < > 136   < > 135 129* 131* 132* 132*  K 3.5   < > 3.6   < > 3.8   < > 4.7   < > 5.8*   < > 5.5* 4.7 4.8 4.9 4.6  CL 106   < > 103   < > 111   < > 109   < > 104  --  104 99 98 99  --   CO2 25   < > 25   < > 20*   < > 20*   < > 21*  --  21* 18* 19* 17*  --   GLUCOSE 120*   < > 112*   < > 178*   < > 136*   < > 199*  --  152* 186* 134* 128*  --   BUN 27*   < > 25*   < > 20   < > 24*   < > 31*  --  35* 43* 51* 55*  --   CREATININE 2.02*   < > 2.13*   < > 2.30*   < > 2.58*   < > 3.57*  --  4.22* 4.99* 5.85* 6.46*  --   CALCIUM 8.6*   < > 8.6*   < > 7.9*   < > 7.9*   < > 7.7*  --  7.8* 7.5* 7.6* 7.9*  --   MG 1.8  --  1.7  --  3.0*  --  2.5*  --  2.6*  --   --   --   --   --   --   PHOS  --   --   --   --   --   --   --   --   --   --   --   --   --  6.9*  --    < > = values in this interval not displayed.   GFR: Estimated Creatinine Clearance: 13.5 mL/min (A) (by C-G formula based on SCr of 6.46 mg/dL (H)). Recent Labs  Lab 12/20/19 0408 12/20/19 1825 12/21/19 0231 12/22/19 0341  WBC 16.7* 15.6* 18.0* 20.5*    Liver Function Tests: Recent Labs  Lab 12/18/19 0500 12/22/19 1155  AST 26  --  ALT 32  --   ALKPHOS 90  --   BILITOT 0.8  --   PROT 6.5  --   ALBUMIN 3.1* 2.4*   No results for input(s): LIPASE, AMYLASE in the last 168 hours. No results for input(s): AMMONIA in the last 168 hours.  ABG    Component Value Date/Time   PHART 7.241 (L) 12/22/2019 1411   PCO2ART 42.6 12/22/2019 1411   PO2ART 42 (L) 12/22/2019 1411   HCO3 18.3 (L) 12/22/2019 1411   TCO2 20 (L) 12/22/2019 1411   ACIDBASEDEF 8.0 (H) 12/22/2019 1411   O2SAT 69.0 12/22/2019 1411     Coagulation Profile: Recent Labs  Lab 12/19/19 1500  INR 1.4*    Cardiac Enzymes: No results for  input(s): CKTOTAL, CKMB, CKMBINDEX, TROPONINI in the last 168 hours.  HbA1C: Hgb A1c MFr Bld  Date/Time Value Ref Range Status  12/11/2019 09:40 PM 5.9 (H) 4.8 - 5.6 % Final    Comment:    (NOTE) Pre diabetes:          5.7%-6.4%  Diabetes:              >6.4%  Glycemic control for   <7.0% adults with diabetes   03/26/2013 10:30 PM 5.8 (H) <5.7 % Final    Comment:    (NOTE)                                                                       According to the ADA Clinical Practice Recommendations for 2011, when HbA1c is used as a screening test:  >=6.5%   Diagnostic of Diabetes Mellitus           (if abnormal result is confirmed) 5.7-6.4%   Increased risk of developing Diabetes Mellitus References:Diagnosis and Classification of Diabetes Mellitus,Diabetes AJOI,7867,67(MCNOB 1):S62-S69 and Standards of Medical Care in         Diabetes - 2011,Diabetes Care,2011,34 (Suppl 1):S11-S61.    CBG: Recent Labs  Lab 12/21/19 1942 12/22/19 0011 12/22/19 0342 12/22/19 0744 12/22/19 1123  GLUCAP 143* 129* 132* 137* 120*    Assessment & Plan:   Critically ill due to undifferentiated shock requiring vasopressor titration. CAD with ischemic cardiomyopathy S/P CABGx5.  Acute hypoxic respiratory insufficiency.  AKI requiring CRRT.  Status post liver transplant Possible Tacrolimus toxicity.   PLAN:   Unclear why vasopressor requirements are increasing.  Continue antibiotics. Start CRRT and correct acidosis.  Stop tacrolimus as possible toxicity.  Started steroids.  Have decreased dobutamine as becoming increasingly tachycardic with addition of norepinephrine for hypotension.  Add vasopressin if necessary to support BP to allow for CRRT.   Daily Goals Checklist  Pain/Anxiety/Delirium protocol (if indicated): minimize VAP protocol (if indicated): not intubated.  Respiratory support goals: nasal cannulae Blood pressure target: titrate for MAP>65 DVT prophylaxis:  lovenox Nutritional status and feeding goals: high nutritional risk GI prophylaxis: not indicated.  Fluid status goals: CRRT - run even  Urinary catheter: no catheter Central lines: right introducer, left HD cathe Glucose control: euglycemic Mobility/therapy needs: OOB to chair Antibiotic de-escalation: continue cefepime and vancomycin Home medication reconciliation: on hold Daily labs: CMP, CBC Code Status: full Family Communication: per cardiac surgery Disposition: ICU  CRITICAL CARE Performed by: Kipp Brood   Total  critical care time: 45 minutes  Critical care time was exclusive of separately billable procedures and treating other patients.  Critical care was necessary to treat or prevent imminent or life-threatening deterioration.  Critical care was time spent personally by me on the following activities: development of treatment plan with patient and/or surrogate as well as nursing, discussions with consultants, evaluation of patient's response to treatment, examination of patient, obtaining history from patient or surrogate, ordering and performing treatments and interventions, ordering and review of laboratory studies, ordering and review of radiographic studies, pulse oximetry, re-evaluation of patient's condition and participation in multidisciplinary rounds.  Kipp Brood, MD [facility] ICU Physician [place]  Pager: [phone] Mobile: [phone] After hours: [phone].    12/22/2019, 2:59 PM

## 2019-12-22 NOTE — Progress Notes (Signed)
CRITICAL VALUE ALERT  Critical Value: Hbg 6.6  Date & Time Notied:  12/22/19 1411  Provider Notified: Lynetta Mare MD  Orders Received/Actions taken: Redraw Hgb/Hct sample may have been diluted, pt already has orders to transfuse 1 unit.

## 2019-12-22 NOTE — Op Note (Signed)
CARDIOTHORACIC SURGERY OPERATIVE NOTE  Date of Procedure: 12/19/19 Preoperative Diagnosis: Severe 3-vessel Coronary Artery Disease; depressed left ventricular function, and paroxysmal atrial fibrillation  Postoperative Diagnosis: Same  Procedure:    Biatrial maze procedure with radiofrequency ablation and cryo ablation  Closure of patent foramen ovale  Coronary Artery Bypass Grafting x 5  Left Internal Mammary Artery to Distal Left Anterior Descending Coronary Artery; Saphenous Vein Graft to right posterior Descending Coronary Artery and right posterior lateral artery as a sequenced graft; pedicled right internal mammary artery to ramus intermedius Branch of Left Circumflex Coronary Artery; Sapheonous Vein Graft to second diagonal Branch Coronary Artery Endoscopic Vein Harvest from right thigh and Lower Leg Bilateral internal mammary artery harvesting  Surgeon: B.  Murvin Natal, MD  Assistant: Jadene Pierini, PA-C  Anesthesia: General  Operative Findings:  Reduced left ventricular systolic function  Good quality internal mammary artery conduits  Good quality saphenous vein conduit  Good quality target vessels for grafting    BRIEF CLINICAL NOTE AND INDICATIONS FOR SURGERY  64 year old active gentleman with a past medical history for liver transplantation in 2009 presented with several weeks history of PND and peripheral edema.  He also experienced dyspnea on exertion.  His work-up suggested depressed left ventricular function which was assessed by left heart catheterization.  This demonstrated severe multivessel coronary artery disease.  He is taken the operating room for coronary artery bypass grafting.  During the work-up he was found to have paroxysmal atrial fibrillation; therefore, intraoperative maze procedure is also offered.   DETAILS OF THE OPERATIVE PROCEDURE  Preparation:  The patient is brought to the operating room on the above mentioned date and central  monitoring was established by the anesthesia team including placement of Swan-Ganz catheter and radial arterial line. The patient is placed in the supine position on the operating table.  Intravenous antibiotics are administered. General endotracheal anesthesia is induced uneventfully. A Foley catheter is placed.  Baseline transesophageal echocardiogram was performed.  Findings were notable for reduced left ventricular function, preserved right ventricular function, and a patent foramen ovale  The patient's chest, abdomen, both groins, and both lower extremities are prepared and draped in a sterile manner. A time out procedure is performed.   Surgical Approach and Conduit Harvest:  A median sternotomy incision was performed and the left internal mammary artery is dissected from the chest wall and prepared for bypass grafting. The left internal mammary artery is notably good quality conduit. Simultaneously, the greater saphenous vein is obtained from the patient's right thigh using endoscopic vein harvest technique.  After removal of the saphenous vein, the small surgical incisions in the lower extremity are closed with absorbable suture.  Next attention was turned to the right hemithorax of the right internal mammary artery and its pedicle mobilized in standard fashion.  Full dose heparin is given intravenously.  Following systemic heparinization, internal mammary arteries were transected distally noted to have excellent flow.  They were both treated with a solution of papaverine.   Extracorporeal Cardiopulmonary Bypass and Myocardial Protection:  The pericardium is opened. The ascending aorta is nondiseased in appearance. The ascending aorta and the SVC and IVC are cannulated for cardiopulmonary bypass.  Adequate heparinization is verified.   A retrograde cardioplegia cannula is placed through the right atrium into the coronary sinus.  The entire pre-bypass portion of the operation was notable for  stable hemodynamics.  Cardiopulmonary bypass was begun and the surface of the heart is inspected. Distal target vessels are selected for coronary  artery bypass grafting. A cardioplegia cannula is placed in the ascending aorta.   The right-sided pulmonary veins are encircled with an RFA clamp and RFA treatment of the right-sided veins is performed.  The patient is allowed to cool passively to [facility] systemic temperature.  The aortic cross clamp is applied and cold blood cardioplegia is delivered initially in an antegrade fashion through the aortic root.   Supplemental cardioplegia is given retrograde through the coronary sinus catheter.  Iced saline slush is applied for topical hypothermia.  The initial cardioplegic arrest is rapid with early diastolic arrest.  Repeat doses of cardioplegia are administered intermittently throughout the entire cross clamp portion of the operation through the aortic root,  through the coronary sinus catheter, and through subsequently placed vein grafts in order to maintain completely flat electrocardiogram.  The ligament of Ruthann Cancer was divided with electrocautery.  This allowed encircling of the left-sided veins which were treated with RFA.  The left atrial appendage was clipped with a 50 mm clip.  Next caval snares were brought down in the SVC and the IVC and the right atrial free wall is opened.  The patent foramen ovale is identified.  A cryoprobe was placed across the PFO to complete the box lesion on the left side structures.  The RFA clamp was used to create the lesions on the free wall of the right atrium and the cryoprobe is used to create the isthmus to caval lesion.  The PFO is then closed with a running suture of 5-0 Prolene.  The right atrial free wall is closed with a running suture of 3-0 Prolene.   Coronary Artery Bypass Grafting:   The posterior descending branch and the posterior lateral branch of the right coronary artery were grafted using a reversed  saphenous vein graft in a sequenced fashion..  At the site of distal anastomosis the target vessels were good quality and measured approximately 1.5 mm in diameter.  The second diagonal branch of the left anterior descending coronary artery was grafted using a reversed saphenous vein graft in an end-to-side fashion.  At the site of distal anastomosis the target vessel was good quality and measured approximately 1.5 mm in diameter.  The ramus branch of the left circumflex coronary artery was grafted in and end-to-side fashion using the pedicled right internal mammary artery graft brought underneath the aorta through the transverse sinus.  At the site of distal anastomosis the target vessel was good quality and measured approximately 1.5 mm in diameter.Anastomotic patency and runoff was confirmed with indocyanine green fluorescence imaging (SPY).  The distal left anterior coronary artery was grafted with the left internal mammary artery in an end-to-side fashion.  At the site of distal anastomosis the target vessel was good quality and measured approximately 1.5 mm in diameter.Anastomotic patency and runoff was confirmed with indocyanine green fluorescence imaging (SPY).  All proximal vein graft anastomoses were placed directly to the ascending aorta prior to removal of the aortic cross clamp.  De-airing procedures were performed and the aortic cross-clamp was removed.   Procedure Completion:  All proximal and distal coronary anastomoses were inspected for hemostasis and appropriate graft orientation. Epicardial pacing wires are fixed to the right ventricular outflow tract and to the right atrial appendage. The patient is rewarmed to 37C temperature. The patient is weaned and disconnected from cardiopulmonary bypass.  The patient's rhythm at separation from bypass was normal sinus.  The patient was weaned from cardiopulmonary bypass with moderate inotropic support.  Followup transesophageal  echocardiogram performed after separation from bypass revealed  no changes from the preoperative exam.  The aortic and venous cannula were removed uneventfully. Protamine was administered to reverse the anticoagulation. The mediastinum and pleural space were inspected for hemostasis and irrigated with saline solution. The mediastinum and bilateral pleural spaces were drained using fluted chest tubes placed through separate stab incisions inferiorly.  The soft tissues anterior to the aorta were reapproximated loosely. The sternum is closed with double strength sternal wire. The soft tissues anterior to the sternum were closed in multiple layers and the skin is closed with a running subcuticular skin closure.    Disposition:  The patient tolerated the procedure well and is transported to the surgical intensive care in stable condition. There are no intraoperative complications. All sponge instrument and needle counts are verified correct at completion of the operation.    Jayme Cloud, MD 12/22/2019 6:33 PM

## 2019-12-22 NOTE — Procedures (Signed)
Cortrak  Person Inserting Tube:  Tore Carreker, RD Tube Type:  Cortrak - 43 inches Tube Location:  Right nare Initial Placement:  Stomach Secured by: Bridle Technique Used to Measure Tube Placement:  Documented cm marking at nare/ corner of mouth Cortrak Secured At:  72 cm   X-ray is required, abdominal x-ray has been ordered by the Cortrak team. Please confirm tube placement before using the Cortrak tube.   If the tube becomes dislodged please keep the tube and contact the Cortrak team at [email or web address] ([email or web address]) for replacement.  If after hours and replacement cannot be delayed, place a NG tube and confirm placement with an abdominal x-ray.    Mariana Single RD, LDN Clinical Nutrition Pager listed in [place]

## 2019-12-22 NOTE — Progress Notes (Signed)
CT surgery p.m. Rounds  Patient placed on CRT today for volume overload and rising creatinine Received 1 unit packed cells and increased pressor support associated with HD catheter placement but blood pressure has now normalized.  We will check hemoglobin 2 hours posttransfusion to keep hemoglobin level greater than 8  Vitals:   12/22/19 1915 12/22/19 1930  BP: (!) 147/69 (!) 142/67  Pulse: (!) 119 (!) 118  Resp: 20 (!) 22  Temp:  98.1 F (36.7 C)  SpO2: 94% 96%

## 2019-12-22 NOTE — Addendum Note (Signed)
Addendum  created 12/22/19 1401 by Josephine Igo, CRNA   Order list changed

## 2019-12-22 NOTE — Progress Notes (Addendum)
Patient ID: Joseph Greer, male   DOB: [birth year], 64 y.o.   MRN: [id number]     Advanced Heart Failure Rounding Note  PCP-Cardiologist: Candee Furbish, MD  AHF:  Dr. [name]   Patient Profile   64 y/o male w/ h/o Hep C s/p liver transplant in 2009 (followed at [facility]), Stage III CKD, HTN and T2DM admitted w/ NSTEMI and Afib w/ RVR, found to have severe 3VD and biventricular heart failure and ? PFO, LVEF 20-25%, RV moderately reduced. S/p CABG + MAZE + LA appendage clipping 7/16.    Subjective:    - 7/16: CABG x 5 with LIMA-LAD, RIMA-ramus, seq SVG-PD/PL, SVG-D; Maze; LA appendage clipping  Co-ox marginal, 58%. On epi 1 + Milrinone 0.5.   Poor response to high dose IV Lasix yesterday, only 162 cc in UOP charted yesterday despite 120 mg IV Lasix. SCr continues to rise, 4.22>>4.99>>5.85. K ok 4.3. Nephrology following.   Complains of nausea. No vomiting. Feels "nervous". No CP or dyspnea.   He is in NSR on amiodarone gtt at 30 mg/hr.   Limited echo yesterday>> LVEF 35-40%. RV normal. No pericardial effusion.    RHC Procedural Findings (pre-op): Hemodynamics (mmHg) RA mean 8 RV 55/11 PA 60/30, mean 44 PCWP mean 36 Oxygen saturations: PA 62% AO 97% Cardiac Output (Fick) 5.3  Cardiac Index (Fick) 2.61 Cardiac Output (Thermo) 5.11 Cardiac Index (Thermo) 2.52  PVR 1.6 WU  Objective:   Weight Range: 91.8 kg Body mass index is 29.03 kg/m.   Vital Signs:   Temp:  [97.6 F (36.4 C)-97.8 F (36.6 C)] 97.8 F (36.6 C) (07/19 0338) Pulse Rate:  [86-98] 98 (07/19 0745) Resp:  [13-25] 14 (07/19 0745) BP: (95-133)/(42-70) 116/52 (07/19 0745) SpO2:  [94 %-100 %] 98 % (07/19 0745) Weight:  [91.8 kg] 91.8 kg (07/19 0500) Last BM Date: 12/18/19  Weight change: Filed Weights   12/20/19 0500 12/21/19 0500 12/22/19 0500  Weight: 90.9 kg 91.9 kg 91.8 kg    Intake/Output:   Intake/Output Summary (Last 24 hours) at 12/22/2019 0909 Last data filed at 12/22/2019 0800 Gross per 24  hour  Intake 1072.78 ml  Output 592 ml  Net 480.78 ml      Physical Exam   CVP 16 General: fatigue appearing, nauseated, no respiratory difficulty  Neck: JVP 14 cm, no thyromegaly or thyroid nodule. +Rt IJ CVC  Lungs: normal respiratory effort. Slight decrease BS at the bases bilaterally w/ faint crackles LUL  CV: Nondisplaced PMI.  Heart regular S1/S2, no S3/S4, no murmur.  Trace ankle edema.   Abdomen: Soft, nontender, no hepatosplenomegaly, no distention.  Skin: Intact without lesions or rashes.  Neurologic: Alert and oriented x 3.  Psych: Normal affect. Extremities: No clubbing or cyanosis.  HEENT: Normal.    Telemetry   NSR 80s (personally reviewed)  EKG    No new EKG to review today.   Labs    CBC Recent Labs    12/21/19 0231 12/22/19 0341  WBC 18.0* 20.5*  HGB 8.3* 7.6*  HCT 27.1* 24.1*  MCV 86.6 85.2  PLT 133* 270   Basic Metabolic Panel Recent Labs    12/20/19 0408 12/20/19 0408 12/20/19 1825 12/21/19 0231 12/21/19 1513 12/22/19 0341  NA 140   < > 136   < > 129* 131*  K 4.7   < > 5.8*   < > 4.7 4.8  CL 109   < > 104   < > 99 98  CO2 20*   < >  21*   < > 18* 19*  GLUCOSE 136*   < > 199*   < > 186* 134*  BUN 24*   < > 31*   < > 43* 51*  CREATININE 2.58*   < > 3.57*   < > 4.99* 5.85*  CALCIUM 7.9*   < > 7.7*   < > 7.5* 7.6*  MG 2.5*  --  2.6*  --   --   --    < > = values in this interval not displayed.   Liver Function Tests No results for input(s): AST, ALT, ALKPHOS, BILITOT, PROT, ALBUMIN in the last 72 hours. No results for input(s): LIPASE, AMYLASE in the last 72 hours. Cardiac Enzymes No results for input(s): CKTOTAL, CKMB, CKMBINDEX, TROPONINI in the last 72 hours.  BNP: BNP (last 3 results) No results for input(s): BNP in the last 8760 hours.  ProBNP (last 3 results) No results for input(s): PROBNP in the last 8760 hours.   D-Dimer No results for input(s): DDIMER in the last 72 hours. Hemoglobin A1C No results for input(s):  HGBA1C in the last 72 hours. Fasting Lipid Panel No results for input(s): CHOL, HDL, LDLCALC, TRIG, CHOLHDL, LDLDIRECT in the last 72 hours. Thyroid Function Tests No results for input(s): TSH, T4TOTAL, T3FREE, THYROIDAB in the last 72 hours.  Invalid input(s): FREET3  Other results:   Imaging    US RENAL  Result Date: 12/21/2019 CLINICAL DATA:  Acute kidney injury, CKD stage 3 EXAM: RENAL / URINARY TRACT ULTRASOUND COMPLETE COMPARISON:  None. FINDINGS: Right Kidney: Renal measurements: 9.6 x 5.2 x 5.2 cm = volume: 137.6 mL. Mild cortical thinning and diffusely increased renal cortical echogenicity. No mass, shadowing calculus or hydronephrosis. Left Kidney: Renal measurements: 9.7 x 6.2 x 6.3 cm = volume: 198.4 mL. Mild cortical thinning and diffusely increased renal cortical echogenicity. No mass, shadowing calculus or hydronephrosis. Bladder: Bladder decompressed around an inflated Foley catheter visualized within the lumen. Other: Mild splenic heterogeneity, a nonspecific finding. IMPRESSION: Increased renal cortical echogenicity and cortical thinning bilaterally. Can be seen in the setting of medical renal disease/chronic kidney disease. Bladder decompressed by Foley catheter. Mild splenic heterogeneity is a nonspecific finding. Few echogenic foci could reflect sequela of prior granulomatous disease. Electronically Signed   By: Lovena Le M.D.   On: 12/21/2019 15:25     Medications:     Scheduled Medications: . acetaminophen  1,000 mg Oral Q6H  . Apremilast  1 tablet Oral BID  . aspirin EC  325 mg Oral Daily  . atorvastatin  80 mg Oral Daily  . bisacodyl  10 mg Oral Daily   Or  . bisacodyl  10 mg Rectal Daily  . chlorhexidine gluconate (MEDLINE KIT)  15 mL Mouth Rinse BID  . Chlorhexidine Gluconate Cloth  6 each Topical Daily  . docusate sodium  200 mg Oral Daily  . enoxaparin (LOVENOX) injection  30 mg Subcutaneous QHS  . insulin aspart  0-24 Units Subcutaneous Q4H  .  pantoprazole  40 mg Oral Daily  . sodium chloride flush  10-40 mL Intracatheter Q12H  . sodium chloride flush  3 mL Intravenous Q12H  . tacrolimus  2 mg Oral BID  . venlafaxine XR  75 mg Oral Daily    Infusions: . sodium chloride Stopped (12/21/19 1602)  . amiodarone 30 mg/hr (12/22/19 0800)  . epinephrine 1 mcg/min (12/22/19 0800)  . lactated ringers Stopped (12/19/19 1448)  . lactated ringers Stopped (12/20/19 1152)  . milrinone 0.5  mcg/kg/min (12/22/19 0800)  . phenylephrine (NEO-SYNEPHRINE) Adult infusion Stopped (12/20/19 0818)    PRN Medications: metoprolol tartrate, morphine injection, ondansetron (ZOFRAN) IV, oxyCODONE, promethazine, senna-docusate, sodium chloride flush, sodium chloride flush, traMADol    Assessment/Plan   1. CAD: Severe 3VD on cath 7/21 => 60-70% ostial LAD, 80% mLAD, 95% ramus, 99% proximal LCx with TIMI-2 flow down the rest of the LCx, occluded distal RCA.  Suspect out of hospital inferolateral MI.  Cardiac MRI showed that the inferolateral wall is likely not viable, but the remainder of the LV myocardium is likely viable. He had CABG 7/16 with LIMA-LAD, RIMA-ramus, seq SVG-PD/PL, SVG-D.  - Continue ASA + atorvastatin.   2. Acute on chronic systolic CHF: Echo with EF 20-25%, moderately decreased RV systolic function. Cardiac MRI with LV EF 26%, RV EF 38%, near full thickness scar in inferolateral wall suggesting lack of viability in the LCx territory.  Now post-CABG. Repeat limited echo 7/18 showed LVEF up to 35-40%, RV normal, no pericardial effusion. On epinephrine 1 + milrinone 0.5. Co-ox 58%.  CVP 16 with minimal UOP despite 120 mg IV x 1 yesterday.  Creatinine continues to rise, now up to 5.85. Suspect post-op ATN.  - Nephrology following.  Anticipate potential CRRT in the next 1 to 2 days - Continue low dose epinephrine and milrinone for now to maintain cardiac output.  3. Atrial fibrillation: Patient admitted with afib/RVR, now in NSR. S/p Maze + LAA  clipping on 7/16.  - Continue amiodarone gtt for now.  - Resume anticoagulation when stable per TCTS.  4. AKI on CKD stage 3: Creatine has been stable 1.9-2.3 range in hospital pre-op.  Was 1.3 back in 4/21. Creatinine 2.5 => now 5.85 post-op. Suspect post-op ATN.  CVP elevated ~16, K 4.8. MAPs low 60s this am, aim to keep >65. Titrate NE as needed.   - Nephrology following, anticipate potential CRRT  - follow BMP  5. Type 2 diabetes: SSI. SGLT2 inhibitor as outpatient if renal function stabilizes.  6. Liver transplant for HCV: Continue tacrolimus, will need to follow levels (sent).  - tacrolimus level sent to lab, results pending   Lyda Jester, PA-C 12/22/2019 9:09 AM  Patient seen with PA, agree with the above note.   Minimal response to Lasix 120 mg IV yesterday.  CVP up to around 20 on my measure today, co-ox 58%.  He is on milrinone 0.5 and epinephrine 1, stable MAP.    Afebrile but WBCs up to 20.5.    NSR on amiodarone gtt.   Nauseated, orthopneic.  Anxious.   General: NAD Neck: JVP 16 cm, no thyromegaly or thyroid nodule.  Lungs: Decreased at bases.  CV: Nondisplaced PMI.  Heart regular S1/S2, no S3/S4, friction rub noted.  1+ ankle edema.   Abdomen: Soft, nontender, no hepatosplenomegaly, no distention.  Skin: Intact without lesions or rashes.  Neurologic: Alert and oriented x 3.  Psych: Anxious. Extremities: No clubbing or cyanosis.  HEENT: Normal.   Creatinine worsening this morning, up to 5.85.  MAP is stable.  Minimal response to high dose IV Lasix yesterday, oliguric.  He is volume overloaded and nauseated.  - He has been seen by renal, will start CVVH today.  Will need placement of HD catheter, CCM to do this morning.  - With worsening renal function, will stop milrinone and transition over to dobutamine 5 mcg/kg/min.  Continue epinephrine for now.   WBCs up to 20.5, hazy opacities on last CXR.  No fever.  -  Will cover empirically for HCAP with  vancomycin/cefepime, send cultures.   Remains in NSR on amiodarone, will need anticoagulation when ok with TCTS.   CRITICAL CARE Performed by: Loralie Champagne  Total critical care time: 35 minutes  Critical care time was exclusive of separately billable procedures and treating other patients.  Critical care was necessary to treat or prevent imminent or life-threatening deterioration.  Critical care was time spent personally by me on the following activities: development of treatment plan with patient and/or surrogate as well as nursing, discussions with consultants, evaluation of patient's response to treatment, examination of patient, obtaining history from patient or surrogate, ordering and performing treatments and interventions, ordering and review of laboratory studies, ordering and review of radiographic studies, pulse oximetry and re-evaluation of patient's condition.  Loralie Champagne 12/22/2019 11:00 AM

## 2019-12-22 NOTE — Progress Notes (Signed)
CRITICAL VALUE ALERT  Critical Value: Lactic Acid 4.9  Date & Time Notied: 12/22/19 1550  Provider Notified: Court Joy MD  Orders Received/Actions taken: No new orders

## 2019-12-22 NOTE — Progress Notes (Signed)
ABG is mixed venous. MD notified.

## 2019-12-23 ENCOUNTER — Inpatient Hospital Stay (HOSPITAL_COMMUNITY): Payer: Medicare HMO

## 2019-12-23 ENCOUNTER — Encounter (INDEPENDENT_AMBULATORY_CARE_PROVIDER_SITE_OTHER): Payer: Medicare HMO | Admitting: Ophthalmology

## 2019-12-23 DIAGNOSIS — I314 Cardiac tamponade: Secondary | ICD-10-CM

## 2019-12-23 LAB — CBC
HCT: 27.6 % — ABNORMAL LOW (ref 39.0–52.0)
Hemoglobin: 8.7 g/dL — ABNORMAL LOW (ref 13.0–17.0)
MCH: 26.5 pg (ref 26.0–34.0)
MCHC: 31.5 g/dL (ref 30.0–36.0)
MCV: 84.1 fL (ref 80.0–100.0)
Platelets: 156 10*3/uL (ref 150–400)
RBC: 3.28 MIL/uL — ABNORMAL LOW (ref 4.22–5.81)
RDW: 16.2 % — ABNORMAL HIGH (ref 11.5–15.5)
WBC: 18.2 10*3/uL — ABNORMAL HIGH (ref 4.0–10.5)
nRBC: 0.6 % — ABNORMAL HIGH (ref 0.0–0.2)

## 2019-12-23 LAB — GLUCOSE, CAPILLARY
Glucose-Capillary: 113 mg/dL — ABNORMAL HIGH (ref 70–99)
Glucose-Capillary: 137 mg/dL — ABNORMAL HIGH (ref 70–99)
Glucose-Capillary: 147 mg/dL — ABNORMAL HIGH (ref 70–99)
Glucose-Capillary: 126 mg/dL — ABNORMAL HIGH (ref 70–99)
Glucose-Capillary: 202 mg/dL — ABNORMAL HIGH (ref 70–99)
Glucose-Capillary: 230 mg/dL — ABNORMAL HIGH (ref 70–99)
Glucose-Capillary: 154 mg/dL — ABNORMAL HIGH (ref 70–99)

## 2019-12-23 LAB — COOXEMETRY PANEL
Carboxyhemoglobin: 1.6 % — ABNORMAL HIGH (ref 0.5–1.5)
Carboxyhemoglobin: 0.8 % (ref 0.5–1.5)
Carboxyhemoglobin: 0.9 % (ref 0.5–1.5)
Methemoglobin: 0.9 % (ref 0.0–1.5)
Methemoglobin: 0.8 % (ref 0.0–1.5)
Methemoglobin: 0.7 % (ref 0.0–1.5)
O2 Saturation: 48.3 %
O2 Saturation: 48.7 %
O2 Saturation: 57.9 %
Total hemoglobin: 9.1 g/dL — ABNORMAL LOW (ref 12.0–16.0)
Total hemoglobin: 9.1 g/dL — ABNORMAL LOW (ref 12.0–16.0)
Total hemoglobin: 8.7 g/dL — ABNORMAL LOW (ref 12.0–16.0)

## 2019-12-23 LAB — RENAL FUNCTION PANEL
Albumin: 2.6 g/dL — ABNORMAL LOW (ref 3.5–5.0)
Albumin: 2.4 g/dL — ABNORMAL LOW (ref 3.5–5.0)
Anion gap: 13 (ref 5–15)
Anion gap: 11 (ref 5–15)
BUN: 34 mg/dL — ABNORMAL HIGH (ref 8–23)
BUN: 29 mg/dL — ABNORMAL HIGH (ref 8–23)
CO2: 21 mmol/L — ABNORMAL LOW (ref 22–32)
CO2: 24 mmol/L (ref 22–32)
Calcium: 8 mg/dL — ABNORMAL LOW (ref 8.9–10.3)
Calcium: 7.8 mg/dL — ABNORMAL LOW (ref 8.9–10.3)
Chloride: 101 mmol/L (ref 98–111)
Chloride: 99 mmol/L (ref 98–111)
Creatinine, Ser: 4.07 mg/dL — ABNORMAL HIGH (ref 0.61–1.24)
Creatinine, Ser: 3.24 mg/dL — ABNORMAL HIGH (ref 0.61–1.24)
GFR calc Af Amer: 17 mL/min — ABNORMAL LOW (ref >60)
GFR calc Af Amer: 22 mL/min — ABNORMAL LOW (ref >60)
GFR calc non Af Amer: 15 mL/min — ABNORMAL LOW (ref >60)
GFR calc non Af Amer: 19 mL/min — ABNORMAL LOW (ref >60)
Glucose, Bld: 162 mg/dL — ABNORMAL HIGH (ref 70–99)
Glucose, Bld: 240 mg/dL — ABNORMAL HIGH (ref 70–99)
Phosphorus: 5.1 mg/dL — ABNORMAL HIGH (ref 2.5–4.6)
Phosphorus: 3.8 mg/dL (ref 2.5–4.6)
Potassium: 5.2 mmol/L — ABNORMAL HIGH (ref 3.5–5.1)
Potassium: 4.8 mmol/L (ref 3.5–5.1)
Sodium: 135 mmol/L (ref 135–145)
Sodium: 134 mmol/L — ABNORMAL LOW (ref 135–145)

## 2019-12-23 LAB — MAGNESIUM
Magnesium: 2.4 mg/dL (ref 1.7–2.4)
Magnesium: 2.6 mg/dL — ABNORMAL HIGH (ref 1.7–2.4)

## 2019-12-23 LAB — ECHOCARDIOGRAM LIMITED
Height: 70 in
Weight: 3298.08 oz

## 2019-12-23 LAB — TACROLIMUS LEVEL: Tacrolimus (FK506) - LabCorp: 6.6 ng/mL (ref 2.0–20.0)

## 2019-12-23 MED ORDER — MILRINONE LACTATE IN DEXTROSE 20-5 MG/100ML-% IV SOLN
0.2500 ug/kg/min | INTRAVENOUS | Status: DC
  Administered 2019-12-23: 0.25 ug/kg/min via INTRAVENOUS
  Filled 2019-12-23: qty 100

## 2019-12-23 MED ORDER — MILRINONE LACTATE IN DEXTROSE 20-5 MG/100ML-% IV SOLN
0.3750 ug/kg/min | INTRAVENOUS | Status: DC
  Administered 2019-12-23 – 2019-12-24 (×4): 0.375 ug/kg/min via INTRAVENOUS
  Filled 2019-12-23 (×5): qty 100

## 2019-12-23 MED ORDER — CHLORHEXIDINE GLUCONATE 0.12 % MT SOLN
OROMUCOSAL | Status: AC
  Administered 2019-12-23: 15 mL via OROMUCOSAL
  Filled 2019-12-23: qty 15

## 2019-12-23 MED ORDER — PRISMASOL BGK 0/2.5 32-2.5 MEQ/L IV SOLN
INTRAVENOUS | Status: DC
  Filled 2019-12-23 (×8): qty 5000

## 2019-12-23 NOTE — Progress Notes (Signed)
Vick Filter S[facility]t Admit Date: 12/11/2019 12/23/2019 [name] Requesting Physician:  [name] MD  Reason for Consult:  AKI on CKD3 SUBJECTIVE Still requiring epinephrine. Anuric. Worsened hypotension with start crrt which has since improved/stabilized. Overall feels well in regards to nausea/vomiting. Not feeling as dizzy anymore.  CRRT Modality-CVVHDF, M100 dialyzer Pre: 400 Post 300 BFR 25m/hr UF goal: net neg 50cc/hr FF 9%   Creatinine, Ser (mg/dL)  Date Value  12/23/2019 4.07 (H)  12/22/2019 6.46 (H)  12/22/2019 5.85 (H)  12/21/2019 4.99 (H)  12/21/2019 4.22 (H)  12/20/2019 3.57 (H)  12/20/2019 2.58 (H)  12/19/2019 2.30 (H)  12/19/2019 2.00 (H)  12/19/2019 1.90 (H)  ] I/Os: I/O last 3 completed shifts: In: 3520.2 [I.V.:1709.2; Blood:650; Other:10; NG/GT:280; IV Piggyback:871] Out: 2713 [Urine:57; OCXKGY:1856 Chest Tube:659]   ROS Balance of 12 systems is negative w/ exceptions as above  PMH  Past Medical History:  Diagnosis Date  . Anxiety   . CKD (chronic kidney disease), stage III   . Depression   . Diabetes mellitus type 2, noninsulin dependent [place]   . Hepatitis C   . Hypertension   . Psoriasis   . [place]    [place] Past Surgical History:  Procedure Laterality Date  . CLIPPING OF ATRIAL APPENDAGE N/A 12/19/2019   Procedure: CLIPPING OF ATRIAL APPENDAGE USING ATRICURE CLIP SIZE 50MM;  Surgeon: AWonda Olds MD;  [facility]  Service: Open Heart Surgery;  Laterality: N/A;  . COLONOSCOPY    . COLONOSCOPY WITH PROPOFOL N/A 06/01/2017   Procedure: COLONOSCOPY WITH PROPOFOL;  Surgeon: RRogene Houston MD;  [facility];  Service: Endoscopy;  Laterality: N/A;  7:30  . CORONARY ARTERY BYPASS GRAFT N/A 12/19/2019   Procedure: CORONARY ARTERY BYPASS GRAFTING (CABG) TIMES  X 5,   USING BILATERAL MAMMARY ARTERY AND RIGHT LEG GREATER SAPHENOUS VEIN HARVESTED ENDOSCOPICALLY;  Surgeon: AWonda Olds MD;   [place] [facility]  Service: Open Heart Surgery;  Laterality: N/A;  . HERNIA REPAIR Right   . LEFT HEART CATH AND CORONARY ANGIOGRAPHY N/A 12/12/2019   Procedure: LEFT HEART CATH AND CORONARY ANGIOGRAPHY;  Surgeon: VJettie Booze MD;  [facility];  Service: Cardiovascular;  Laterality: N/A;  . LIVER BIOPSY    . LIVER SURGERY    . MAZE N/A 12/19/2019   Procedure: MAZE;  Surgeon: AWonda Olds MD;  [place] [facility]  Service: Open Heart Surgery;  Laterality: N/A;  . POLYPECTOMY  06/01/2017   Procedure: POLYPECTOMY;  Surgeon: RRogene Houston MD;  [facility];  Service: Endoscopy;;  polyp at sigmoid colon x2, ascending colon polyp, hepatic flexure polyp  . RIGHT HEART CATH N/A 12/17/2019   Procedure: RIGHT HEART CATH;  Surgeon: MLarey Dresser MD;  [facility];  Service: Cardiovascular;  Laterality: N/A;  . TEE WITHOUT CARDIOVERSION N/A 12/19/2019   Procedure: TRANSESOPHAGEAL ECHOCARDIOGRAM (TEE);  Surgeon: AWonda Olds MD;  [place]  Service: Open Heart Surgery;  Laterality: N/A;  . UPPER GASTROINTESTINAL ENDOSCOPY     FH  Family History  Problem Relation Age of Onset  . Diabetes Mother   . Heart disease Father   . Heart disease Son    [place] reports that he quit smoking about 12 years ago. His smoking use included cigarettes. He smoked 1.00 pack per day. He has never used smokeless tobacco. He reports that he does not drink alcohol and does  not use drugs. Allergies No Known Allergies Home medications Prior to Admission medications   Medication Sig Start Date End Date Taking? Authorizing Provider  amLODipine (NORVASC) 5 MG tablet Take 5 mg by mouth daily. 10/28/19  Yes [provider]  Apremilast (OTEZLA) 30 MG TABS Take 1 tablet by mouth 2 (two) times daily. 06/16/19  Yes [provider]  LORazepam (ATIVAN) 1 MG tablet Take 1 mg by mouth 2 (two) times daily as needed (panic attacks).  11/28/19  Yes [provider]  nitroGLYCERIN (NITROSTAT) 0.4 MG SL tablet Place 1 tablet (0.4 mg total) under the tongue every 5 (five) minutes x 3 doses as needed for chest pain. 03/27/13  Yes Rai, Ripudeep K, MD  tacrolimus (PROGRAF) 1 MG capsule Take 2 mg by mouth 2 (two) times daily.    Yes [provider]  venlafaxine XR (EFFEXOR-XR) 75 MG 24 hr capsule Take 75 mg by mouth daily.   Yes [provider]  buPROPion (WELLBUTRIN) 75 MG tablet Take 75 mg by mouth 2 (two) times daily.  Patient not taking: Reported on 12/11/2019    [provider]  FLUoxetine (PROZAC) 40 MG capsule Take 40 mg by mouth daily. Patient not taking: Reported on 12/11/2019    [provider]  lisinopril (PRINIVIL,ZESTRIL) 20 MG tablet Take 20 mg by mouth daily. Patient not taking: Reported on 12/11/2019    [provider]  mycophenolate (CELLCEPT) 250 MG capsule Take 250 mg by mouth 2 (two) times daily.  Patient not taking: Reported on 12/11/2019    [provider]  triamcinolone ointment (KENALOG) 0.1 % Apply 1 application topically 2 (two) times daily. Patient not taking: Reported on 12/11/2019    [provider]    Current Medications Scheduled Meds: . Apremilast  1 tablet Oral Daily  . aspirin EC  81 mg Oral Daily  . atorvastatin  80 mg Oral Daily  . B-complex with vitamin C  1 tablet Per Tube Daily  . bisacodyl  10 mg Oral Daily   Or  . bisacodyl  10 mg Rectal Daily  . chlorhexidine gluconate (MEDLINE KIT)  15 mL Mouth Rinse BID  . Chlorhexidine Gluconate Cloth  6 each Topical Daily  . clopidogrel  75 mg Oral Daily  . docusate sodium  200 mg Oral Daily  . enoxaparin (LOVENOX) injection  30 mg Subcutaneous QHS  . feeding supplement (PROSource TF)  45 mL Per Tube BID  . hydrocortisone sod succinate (SOLU-CORTEF) inj  100 mg Intravenous Q8H  . insulin aspart  0-24 Units Subcutaneous Q4H  . pantoprazole  40 mg Oral Daily  . sodium chloride flush  3 mL Intravenous Q12H   . venlafaxine XR  37.5 mg Oral Daily   Continuous Infusions: .  prismasol BGK 4/2.5 400 mL/hr at 12/23/19 0324  . sodium chloride Stopped (12/21/19 1602)  . amiodarone 30 mg/hr (12/23/19 0900)  . ceFEPime (MAXIPIME) IV Stopped (12/22/19 2247)  . epinephrine 1 mcg/min (12/23/19 0900)  . feeding supplement (VITAL 1.5 CAL) 30 mL/hr at 12/23/19 0900  . lactated ringers Stopped (12/19/19 1448)  . lactated ringers Stopped (12/20/19 1152)  . milrinone 0.25 mcg/kg/min (12/23/19 0900)  . norepinephrine (LEVOPHED) Adult infusion Stopped (12/23/19 0248)  . prismasol BGK 2/2.5 replacement solution    . prismasol BGK 4/2.5 1,800 mL/hr at 12/23/19 0759  . vancomycin    . vasopressin Stopped (12/23/19 0702)   PRN Meds:.heparin, LORazepam, metoprolol tartrate, ondansetron (ZOFRAN) IV, oxyCODONE, senna-docusate, sodium chloride flush  CBC Recent Labs  Lab 12/22/19 0341 12/22/19 0341 12/22/19 1411 12/22/19 1530 12/23/19 0436  WBC 20.5*  --  16.4*  --  18.2*  HGB 7.6*   < > 6.6*  7.1* 6.7* 8.7*  HCT 24.1*   < > 21.5*  21.0* 21.9* 27.6*  MCV 85.2  --  84.3  --  84.1  PLT 163  --  158  --  156   < > = values in this interval not displayed.   Basic Metabolic Panel Recent Labs  Lab 12/20/19 0408 12/20/19 0408 12/20/19 1825 12/21/19 0231 12/21/19 1513 12/22/19 0341 12/22/19 1155 12/22/19 1411 12/22/19 1539 12/23/19 0436  NA 140   < > 136 135 129* 131* 132* 132*  --  135  K 4.7   < > 5.8* 5.5* 4.7 4.8 4.9 4.6  --  5.2*  CL 109  --  104 104 99 98 99  --   --  101  CO2 20*  --  21* 21* 18* 19* 17*  --   --  21*  GLUCOSE 136*  --  199* 152* 186* 134* 128*  --   --  162*  BUN 24*  --  31* 35* 43* 51* 55*  --   --  34*  CREATININE 2.58*  --  3.57* 4.22* 4.99* 5.85* 6.46*  --   --  4.07*  CALCIUM 7.9*  --  7.7* 7.8* 7.5* 7.6* 7.9*  --   --  8.0*  PHOS  --   --   --   --   --   --  6.9*  --  7.3* 5.1*   < > = values in this interval not displayed.    Physical Exam  Blood pressure (!)  142/55, pulse 87, temperature 98.6 F (37 C), temperature source Axillary, resp. rate 18, height 5' 10"  (1.778 m), weight 93.5 kg, SpO2 92 %. GEN: NAD, sitting up in bed, conversant HEENT: NCAT, anicteric sclera CV: Regular, normal S1-S2, slightly pericardial rub PULM: Diminished air entry left greater than right base with crackles and rales (improved from yesterday), unlabored, bilateral chest expansion ABD: Soft, nontender nondistended SKIN: No rashes or lesions , midchest wall incision c/d/i EXT: trace edema bilateral le's NEURO: nonfocal AAO x3, speech clear and coherent   Assessment 26M with oliguric AoCKD3 in setting of LHF 7/9 and 5V CABG 7/16 + MAZE.  Suspect this is ATN following cardiac surgery  1. AoCKD3 (oligo-anuric), BL SCr 1.2 to 1.5, no outpt nephrology; suspect ATN; exhibiting early uremic symptoms with loss of appetite along with nausea/vomiting (Cr continues to rise). No response in urine output with diuretics initially. Started CRRT on 12/22/2019 for uremic symptoms and volume. 2. NSTEMI status post 5 vessel CABG 7/16 3. Atrial fibrillation status post maze with left atrial appendage clipping 7/16.  On amiodarone 4. Acute on chronic sCHF/cardiogenic shock on epinephrine and milrinone 5. Status post liver transplant 2009 ([facility]) on tacrolimus followed by [facility] 6. Hypertension 7. DM2  Plan 1. Continue with CRRT for now, changed post bath to 2K given K is trending upwards 2. Will continue to monitor for renal recovery moving forward 3. Daily weights, Strict I/Os, Avoid nephrotoxins (NSAIDs, judicious IV Contrast)   [name], MD [facility] 12/23/2019, 9:44 AM

## 2019-12-23 NOTE — Progress Notes (Signed)
Patient ID: Joseph Greer, male   DOB: Mar 05, 1956, 64 y.o.   MRN: [id number] EVENING ROUNDS NOTE :     [address]       [place] [address]             [phone]                 4 Days Post-Op Procedure(s) (LRB): CORONARY ARTERY BYPASS GRAFTING (CABG) TIMES  X 5,   USING BILATERAL MAMMARY ARTERY AND RIGHT LEG GREATER SAPHENOUS VEIN HARVESTED ENDOSCOPICALLY (N/A) TRANSESOPHAGEAL ECHOCARDIOGRAM (TEE) (N/A) INDOCYANINE GREEN FLUORESCENCE IMAGING (ICG) (N/A) MAZE (N/A) CLIPPING OF ATRIAL APPENDAGE USING ATRICURE CLIP SIZE 50MM (N/A)  Total Length of Stay:  LOS: 11 days  BP (!) 141/73   Pulse 90   Temp 97.6 F (36.4 C) (Axillary)   Resp (!) 22   Ht 5\' 10"  (1.778 m)   Wt 93.5 kg   SpO2 91%   BMI 29.58 kg/m   .Intake/Output      07/19 0701 - 07/20 0700 07/20 0701 - 07/21 0700   P.O.  438   I.V. (mL/kg) 1709.2 (18.3) 286.2 (3.1)   Blood 650    Other 10    NG/GT 280 430   IV Piggyback 871 300.1   Total Intake(mL/kg) 3520.2 (37.6) 1454.3 (15.6)   Urine (mL/kg/hr) 0 (0)    Other 1997 2627   Stool  0   Chest Tube 409 110   Total Output 2406 2737   Net +1114.2 -1282.8        Stool Occurrence  2 x     .  prismasol BGK 4/2.5 400 mL/hr at 12/23/19 1600  . sodium chloride Stopped (12/21/19 1602)  . amiodarone 30 mg/hr (12/23/19 1701)  . ceFEPime (MAXIPIME) IV Stopped (12/23/19 1036)  . epinephrine 1 mcg/min (12/23/19 1700)  . feeding supplement (VITAL 1.5 CAL) 40 mL/hr at 12/23/19 1300  . lactated ringers Stopped (12/19/19 1448)  . lactated ringers Stopped (12/20/19 1152)  . milrinone 0.375 mcg/kg/min (12/23/19 1709)  . norepinephrine (LEVOPHED) Adult infusion Stopped (12/23/19 0248)  . prismasol BGK 2/2.5 replacement solution 300 mL/hr at 12/23/19 1111  . prismasol BGK 4/2.5 1,800 mL/hr at 12/23/19 1640  . vancomycin Stopped (12/23/19 1301)  . vasopressin Stopped (12/23/19 0702)     Lab Results  Component Value Date   WBC 18.2 (H) 12/23/2019    HGB 8.7 (L) 12/23/2019   HCT 27.6 (L) 12/23/2019   PLT 156 12/23/2019   GLUCOSE 240 (H) 12/23/2019   CHOL 219 (H) 12/12/2019   TRIG 117 12/12/2019   HDL 38 (L) 12/12/2019   LDLCALC 158 (H) 12/12/2019   ALT 20 12/22/2019   AST 31 12/22/2019   NA 134 (L) 12/23/2019   K 4.8 12/23/2019   CL 99 12/23/2019   CREATININE 3.24 (H) 12/23/2019   BUN 29 (H) 12/23/2019   CO2 24 12/23/2019   TSH 4.855 (H) 12/11/2019   INR 1.4 (H) 12/19/2019   HGBA1C 5.9 (H) 12/11/2019   On epi, milrinone On cvvh  So far 1300 off today  Cr up to 3.24   [name] MD  Beeper [phone] Office [phone] 12/23/2019 5:47 PM

## 2019-12-23 NOTE — Progress Notes (Signed)
Repeat co-ox on 0.25 of milrinone remains low at 49%. Discussed w/ Dr. [name]. Will increase milrinone to 0.375 mcg/kg/min.   Joseph Greer [employer]

## 2019-12-23 NOTE — Progress Notes (Signed)
Patient did not sleep much at all overnight. Talking to self with confused conversation. Patient just stated "I keep hearing things."  Alert to self and place but confused to situation. Tremors to the upper extremities and restless overnight, picking at his gown and medical devices at times. Tolerating CRRT. Able to titrate levophed drip off and titrating vasopressin down at this time.

## 2019-12-23 NOTE — Progress Notes (Signed)
[facility] DAILY ICU PROGRESS NOTE                   [address]            [place] [address]          [phone]   4 Days Post-Op Procedure(s) (LRB): CORONARY ARTERY BYPASS GRAFTING (CABG) TIMES  X 5,   USING BILATERAL MAMMARY ARTERY AND RIGHT LEG GREATER SAPHENOUS VEIN HARVESTED ENDOSCOPICALLY (N/A) TRANSESOPHAGEAL ECHOCARDIOGRAM (TEE) (N/A) INDOCYANINE GREEN FLUORESCENCE IMAGING (ICG) (N/A) MAZE (N/A) CLIPPING OF ATRIAL APPENDAGE USING ATRICURE CLIP SIZE 50MM (N/A)  Total Length of Stay:  LOS: 11 days   Subjective: Levophed weaned off Confusion improving but present  Objective: Vital signs in last 24 hours: Temp:  [97.6 F (36.4 C)-98.3 F (36.8 C)] 98 F (36.7 C) (07/20 0400) Pulse Rate:  [84-143] 88 (07/20 0700) Cardiac Rhythm: A-V Sequential paced (07/20 0400) Resp:  [11-25] 21 (07/20 0700) BP: (63-167)/(28-110) 149/61 (07/20 0700) SpO2:  [87 %-100 %] 96 % (07/20 0700) Weight:  [93.5 kg] 93.5 kg (07/20 0449)  Filed Weights   12/21/19 0500 12/22/19 0500 12/23/19 0449  Weight: 91.9 kg 91.8 kg 93.5 kg    Weight change: 1.737 kg   Hemodynamic parameters for last 24 hours: CVP:  [8 mmHg-27 mmHg] 16 mmHg  Intake/Output from previous day: 07/19 0701 - 07/20 0700 In: 3520.2 [I.V.:1709.2; Blood:650; NG/GT:280; IV Piggyback:871] Out: 2406 [Chest VVZS:827]  Intake/Output this shift: No intake/output data recorded.  Current Meds: Scheduled Meds: . Apremilast  1 tablet Oral Daily  . aspirin EC  81 mg Oral Daily  . atorvastatin  80 mg Oral Daily  . B-complex with vitamin C  1 tablet Per Tube Daily  . bisacodyl  10 mg Oral Daily   Or  . bisacodyl  10 mg Rectal Daily  . chlorhexidine gluconate (MEDLINE KIT)  15 mL Mouth Rinse BID  . Chlorhexidine Gluconate Cloth  6 each Topical Daily  . clopidogrel  75 mg Oral Daily  . docusate sodium  200 mg Oral Daily  . enoxaparin (LOVENOX) injection  30 mg Subcutaneous QHS  . feeding supplement (PROSource TF)   45 mL Per Tube BID  . hydrocortisone sod succinate (SOLU-CORTEF) inj  100 mg Intravenous Q8H  . insulin aspart  0-24 Units Subcutaneous Q4H  . pantoprazole  40 mg Oral Daily  . sodium chloride flush  3 mL Intravenous Q12H  . venlafaxine XR  37.5 mg Oral Daily   Continuous Infusions: .  prismasol BGK 4/2.5 400 mL/hr at 12/23/19 0324  .  prismasol BGK 4/2.5 300 mL/hr at 12/22/19 1527  . sodium chloride Stopped (12/21/19 1602)  . amiodarone 30 mg/hr (12/23/19 0700)  . ceFEPime (MAXIPIME) IV Stopped (12/22/19 2247)  . epinephrine 1 mcg/min (12/23/19 0700)  . feeding supplement (VITAL 1.5 CAL) 30 mL/hr at 12/23/19 0300  . lactated ringers Stopped (12/19/19 1448)  . lactated ringers Stopped (12/20/19 1152)  . norepinephrine (LEVOPHED) Adult infusion Stopped (12/23/19 0248)  . prismasol BGK 4/2.5 1,800 mL/hr at 12/23/19 0500  . vancomycin    . vasopressin 0.01 Units/min (12/23/19 0700)   PRN Meds:.heparin, LORazepam, metoprolol tartrate, ondansetron (ZOFRAN) IV, oxyCODONE, senna-docusate, sodium chloride flush  General appearance: alert, cooperative, distracted and + tremorous Heart: impulse: normal and regular rate and rhythm Lungs: fairly clear anteriorly Abdomen: soft, nontender  Extremities: + edema Wound: incis healing well + systolic murmur  Lab Results: CBC: Recent Labs    12/22/19 1411  12/22/19 1411 12/22/19 1530 12/23/19 0436  WBC 16.4*  --   --  18.2*  HGB 6.6*  7.1*   < > 6.7* 8.7*  HCT 21.5*  21.0*   < > 21.9* 27.6*  PLT 158  --   --  156   < > = values in this interval not displayed.   BMET:  Recent Labs    12/22/19 1155 12/22/19 1155 12/22/19 1411 12/23/19 0436  NA 132*   < > 132* 135  K 4.9   < > 4.6 5.2*  CL 99  --   --  101  CO2 17*  --   --  21*  GLUCOSE 128*  --   --  162*  BUN 55*  --   --  34*  CREATININE 6.46*  --   --  4.07*  CALCIUM 7.9*  --   --  8.0*   < > = values in this interval not displayed.    CMET: Lab Results  Component  Value Date   WBC 18.2 (H) 12/23/2019   HGB 8.7 (L) 12/23/2019   HCT 27.6 (L) 12/23/2019   PLT 156 12/23/2019   GLUCOSE 162 (H) 12/23/2019   CHOL 219 (H) 12/12/2019   TRIG 117 12/12/2019   HDL 38 (L) 12/12/2019   LDLCALC 158 (H) 12/12/2019   ALT 20 12/22/2019   AST 31 12/22/2019   NA 135 12/23/2019   K 5.2 (H) 12/23/2019   CL 101 12/23/2019   CREATININE 4.07 (H) 12/23/2019   BUN 34 (H) 12/23/2019   CO2 21 (L) 12/23/2019   TSH 4.855 (H) 12/11/2019   INR 1.4 (H) 12/19/2019   HGBA1C 5.9 (H) 12/11/2019      PT/INR: No results for input(s): LABPROT, INR in the last 72 hours. Radiology: [facility] Chest Port 1 View  Result Date: 12/22/2019 CLINICAL DATA:  Central line placement. EXAM: PORTABLE CHEST 1 VIEW COMPARISON:  December 21, 2019. FINDINGS: Stable cardiomegaly. Right internal jugular venous sheath is unchanged. Right-sided PICC line is unchanged. Interval placement of left internal jugular catheter with distal tip in expected position of the SVC. No pneumothorax or pleural effusion is noted. Bilateral chest tubes are noted in unchanged. Stable left basilar atelectasis or infiltrate is noted. Minimal right basilar subsegmental atelectasis is noted. Bony thorax is unremarkable. IMPRESSION: Interval placement of left internal jugular catheter with distal tip in expected position of the SVC. No pneumothorax is noted. Otherwise stable support apparatus. Electronically Signed   By: Marijo Conception M.D.   On: 12/22/2019 14:53   DG Abd Portable 1V  Result Date: 12/22/2019 CLINICAL DATA:  Feeding tube placement EXAM: PORTABLE ABDOMEN - 1 VIEW COMPARISON:  None. FINDINGS: Nasoenteric feeding tube is seen looped within the gastric fundus. There is mild gaseous distension of the stomach. Multiple gas-filled loops of small and large bowel are seen within the visualized abdomen suggesting an underlying ileus. Right flank and pelvis is excluded from view. Visualized lung bases are clear. Mild cardiomegaly is  noted. IMPRESSION: Nasoenteric feeding tube looped within the gastric fundus. Mild ileus suspected. Electronically Signed   By: Fidela Salisbury MD   On: 12/22/2019 16:42     Assessment/Plan: S/P Procedure(s) (LRB): CORONARY ARTERY BYPASS GRAFTING (CABG) TIMES  X 5,   USING BILATERAL MAMMARY ARTERY AND RIGHT LEG GREATER SAPHENOUS VEIN HARVESTED ENDOSCOPICALLY (N/A) TRANSESOPHAGEAL ECHOCARDIOGRAM (TEE) (N/A) INDOCYANINE GREEN FLUORESCENCE IMAGING (ICG) (N/A) MAZE (N/A) CLIPPING OF ATRIAL APPENDAGE USING ATRICURE CLIP SIZE 50MM (N/A)  1 more stable this am, levophed off. Milrinone to be restarted. Co_OX 48. conts Epi/vasopressin 2 CVP 16, now on CRRT- tolerating, Creat 4.07 3 LFT's ok, will need to determine when Tacrolimus can restart 4 ID mod leukocytosis- on Vanc/Maxipime 5 H/H improved post transfusion 6 CXR- pending 7 nephrology/PCCM/AHF assisting with management 8 checking echo this am 9 pulm toilet/mobilize as able John Giovanni PA-C Pager [phone] 12/23/2019 7:19 AM

## 2019-12-23 NOTE — Progress Notes (Signed)
4 Days Post-Op Procedure(s) (LRB): CORONARY ARTERY BYPASS GRAFTING (CABG) TIMES  X 5,   USING BILATERAL MAMMARY ARTERY AND RIGHT LEG GREATER SAPHENOUS VEIN HARVESTED ENDOSCOPICALLY (N/A) TRANSESOPHAGEAL ECHOCARDIOGRAM (TEE) (N/A) INDOCYANINE GREEN FLUORESCENCE IMAGING (ICG) (N/A) MAZE (N/A) CLIPPING OF ATRIAL APPENDAGE USING ATRICURE CLIP SIZE 50MM (N/A) Subjective: Feeling better Objective: Vital signs in last 24 hours: Temp:  [97.6 F (36.4 C)-98.3 F (36.8 C)] 98 F (36.7 C) (07/20 0400) Pulse Rate:  [84-143] 88 (07/20 0700) Cardiac Rhythm: A-V Sequential paced (07/20 0400) Resp:  [11-25] 21 (07/20 0700) BP: (63-167)/(28-110) 149/61 (07/20 0700) SpO2:  [87 %-100 %] 96 % (07/20 0700) Weight:  [93.5 kg] 93.5 kg (07/20 0449)  Hemodynamic parameters for last 24 hours: CVP:  [8 mmHg-27 mmHg] 16 mmHg  Intake/Output from previous day: 07/19 0701 - 07/20 0700 In: 3520.2 [I.V.:1709.2; Blood:650; NG/GT:280; IV Piggyback:871] Out: 2406 [Chest KWIO:973] Intake/Output this shift: No intake/output data recorded.  General appearance: no distress and slowed mentation Neurologic: intact Heart: irregularly irregular rhythm Lungs: clear to auscultation bilaterally Abdomen: mild distension, nontender Extremities: edema 2+ Wound: c/d/i  Lab Results: Recent Labs    12/22/19 1411 12/22/19 1411 12/22/19 1530 12/23/19 0436  WBC 16.4*  --   --  18.2*  HGB 6.6*  7.1*   < > 6.7* 8.7*  HCT 21.5*  21.0*   < > 21.9* 27.6*  PLT 158  --   --  156   < > = values in this interval not displayed.   BMET:  Recent Labs    12/22/19 1155 12/22/19 1155 12/22/19 1411 12/23/19 0436  NA 132*   < > 132* 135  K 4.9   < > 4.6 5.2*  CL 99  --   --  101  CO2 17*  --   --  21*  GLUCOSE 128*  --   --  162*  BUN 55*  --   --  34*  CREATININE 6.46*  --   --  4.07*  CALCIUM 7.9*  --   --  8.0*   < > = values in this interval not displayed.    PT/INR: No results for input(s): LABPROT, INR in the  last 72 hours. ABG    Component Value Date/Time   PHART 7.241 (L) 12/22/2019 1411   HCO3 18.3 (L) 12/22/2019 1411   TCO2 20 (L) 12/22/2019 1411   ACIDBASEDEF 8.0 (H) 12/22/2019 1411   O2SAT 48.3 12/23/2019 0436   CBG (last 3)  Recent Labs    12/22/19 2338 12/23/19 0435 12/23/19 0653  GLUCAP 137* 147* 126*    Assessment/Plan: S/P Procedure(s) (LRB): CORONARY ARTERY BYPASS GRAFTING (CABG) TIMES  X 5,   USING BILATERAL MAMMARY ARTERY AND RIGHT LEG GREATER SAPHENOUS VEIN HARVESTED ENDOSCOPICALLY (N/A) TRANSESOPHAGEAL ECHOCARDIOGRAM (TEE) (N/A) INDOCYANINE GREEN FLUORESCENCE IMAGING (ICG) (N/A) MAZE (N/A) CLIPPING OF ATRIAL APPENDAGE USING ATRICURE CLIP SIZE 50MM (N/A) Mobilize check echo to assess biventricular function  HD orders per nephrology Restart milrinone   LOS: 11 days    Wonda Olds 12/23/2019

## 2019-12-23 NOTE — Progress Notes (Signed)
  Echocardiogram 2D Echocardiogram has been performed.  Joseph Greer 12/23/2019, 8:06 AM

## 2019-12-23 NOTE — Consult Note (Signed)
NAME:  Joseph Greer, MRN:  [id number], DOB:  05-25-1956, LOS: 75 ADMISSION DATE:  12/11/2019, CONSULTATION DATE:  12/22/2019 REFERRING MD:  Orvan Seen, CHIEF COMPLAINT:  shock   HPI/course in hospital  64 year old man who was admitted for NSTEMI and was found to have severe biventricular failure and 3V CAD.  EF was 25%  Prior liver transplant at [facility] 2009.   7/16 - CABGx5 with Maze for Afib.   Post operative course complicated by persistent LV dysfunction and AKI.   CRRT initiated 7/19.   Past Medical History   Past Medical History:  Diagnosis Date   Anxiety    CKD (chronic kidney disease), stage III    Depression    Diabetes mellitus type 2, noninsulin dependent ([place])    Hepatitis C    Hypertension    Psoriasis    Status post liver transplant [facility])      Past Surgical History:  Procedure Laterality Date   CLIPPING OF ATRIAL APPENDAGE N/A 12/19/2019   Procedure: CLIPPING OF ATRIAL APPENDAGE USING ATRICURE CLIP SIZE 50MM;  Surgeon: Wonda Olds, MD;  Location: [place];  Service: Open Heart Surgery;  Laterality: N/A;   COLONOSCOPY     COLONOSCOPY WITH PROPOFOL N/A 06/01/2017   Procedure: COLONOSCOPY WITH PROPOFOL;  Surgeon: Rogene Houston, MD;  [facility];  Service: Endoscopy;  Laterality: N/A;  7:30   CORONARY ARTERY BYPASS GRAFT N/A 12/19/2019   Procedure: CORONARY ARTERY BYPASS GRAFTING (CABG) TIMES  X 5,   USING BILATERAL MAMMARY ARTERY AND RIGHT LEG GREATER SAPHENOUS VEIN HARVESTED ENDOSCOPICALLY;  Surgeon: Wonda Olds, MD;  Location: [place];  Service: Open Heart Surgery;  Laterality: N/A;   HERNIA REPAIR Right    LEFT HEART CATH AND CORONARY ANGIOGRAPHY N/A 12/12/2019   Procedure: LEFT HEART CATH AND CORONARY ANGIOGRAPHY;  Surgeon: Jettie Booze, MD;  Location: [place] [facility];  Service: Cardiovascular;  Laterality: N/A;   LIVER BIOPSY     LIVER SURGERY     MAZE N/A 12/19/2019   Procedure: MAZE;  Surgeon: Wonda Olds,  MD;  Location: [place];  Service: Open Heart Surgery;  Laterality: N/A;   POLYPECTOMY  06/01/2017   Procedure: POLYPECTOMY;  Surgeon: Rogene Houston, MD;  [facility];  Service: Endoscopy;;  polyp at sigmoid colon x2, ascending colon polyp, hepatic flexure polyp   RIGHT HEART CATH N/A 12/17/2019   Procedure: RIGHT HEART CATH;  Surgeon: Larey Dresser, MD;  Location: [place] CV LAB;  Service: Cardiovascular;  Laterality: N/A;   TEE WITHOUT CARDIOVERSION N/A 12/19/2019   Procedure: TRANSESOPHAGEAL ECHOCARDIOGRAM (TEE);  Surgeon: Wonda Olds, MD;  Location: [place];  Service: Open Heart Surgery;  Laterality: N/A;   UPPER GASTROINTESTINAL ENDOSCOPY        Interim history/subjective:  POD#4 from his CABG x 5 , MAZE, and left appendage clipping 7/16.Weaned off off of Levo and Vaso. Dobutamine stopped. On epinephrine and milrinone restarted this am.   This morning patient is more alert. Continues to have a tremor. [place]ro level is pending, last one was 7/15.     Objective   Blood pressure (!) 136/57, pulse 86, temperature 98.6 F (37 C), temperature source Axillary, resp. rate 17, height 5\' 10"  (1.778 m), weight 93.5 kg, SpO2 90 %. CVP:  [8 mmHg-30 mmHg] 30 mmHg      Intake/Output Summary (Last 24 hours) at 12/23/2019 0836 Last data filed at 12/23/2019 0801 Gross per 24  hour  Intake 3223.13 ml  Output 2397 ml  Net 826.13 ml   Filed Weights   12/21/19 0500 12/22/19 0500 12/23/19 0449  Weight: 91.9 kg 91.8 kg 93.5 kg   Examinations: General: NAD, nl appearance HE: Normocephalic, atraumatic , EOMI, Conjunctivae normal ENT: trachea midline. Wearing nasal canula. R IJ CVC , L IJ HD cath Cardiovascular: Normal rate, regular rhythm.  No murmurs, rubs, or gallops Pulmonary : Effort normal, breath sounds normal. No wheezes, rales, or rhonchi Abdominal: soft, nontender,  bowel sounds present Musculoskeletal: no swelling , deformity, injury ,or tenderness in  extremities, Skin: Warm, dry , no bruising, erythema, or rash Psychiatric/Behavioral:  normal mood, normal behavior  Neuro: AOx3   , slow mentation    Coox is 48.3. Hgb 6.7>8.7 with 2 units yesterday. Cr.6.46 >4.07 , Bun 34, Mg 2.4, K 5.2 Ancillary tests (personally reviewed)  CBC: Recent Labs  Lab 12/20/19 1825 12/20/19 1825 12/21/19 0231 12/22/19 0341 12/22/19 1411 12/22/19 1530 12/23/19 0436  WBC 15.6*  --  18.0* 20.5* 16.4*  --  18.2*  HGB 8.8*   < > 8.3* 7.6* 6.6*   7.1* 6.7* 8.7*  HCT 28.4*   < > 27.1* 24.1* 21.5*   21.0* 21.9* 27.6*  MCV 86.1  --  86.6 85.2 84.3  --  84.1  PLT 136*  --  133* 163 158  --  156   < > = values in this interval not displayed.    Basic Metabolic Panel: Recent Labs  Lab 12/19/19 2008 12/19/19 2059 12/20/19 0408 12/20/19 0408 12/20/19 1825 12/20/19 1825 12/21/19 0231 12/21/19 0231 12/21/19 1513 12/22/19 0341 12/22/19 1155 12/22/19 1411 12/22/19 1539 12/23/19 0436  NA 143   < > 140   < > 136   < > 135   < > 129* 131* 132* 132*  --  135  K 3.8   < > 4.7   < > 5.8*   < > 5.5*   < > 4.7 4.8 4.9 4.6  --  5.2*  CL 111   < > 109   < > 104   < > 104  --  99 98 99  --   --  101  CO2 20*   < > 20*   < > 21*   < > 21*  --  18* 19* 17*  --   --  21*  GLUCOSE 178*   < > 136*   < > 199*   < > 152*  --  186* 134* 128*  --   --  162*  BUN 20   < > 24*   < > 31*   < > 35*  --  43* 51* 55*  --   --  34*  CREATININE 2.30*   < > 2.58*   < > 3.57*   < > 4.22*  --  4.99* 5.85* 6.46*  --   --  4.07*  CALCIUM 7.9*   < > 7.9*   < > 7.7*   < > 7.8*  --  7.5* 7.6* 7.9*  --   --  8.0*  MG 3.0*  --  2.5*  --  2.6*  --   --   --   --   --   --   --  2.2 2.4  PHOS  --   --   --   --   --   --   --   --   --   --  6.9*  --  7.3* 5.1*   < > = values in this interval not displayed.   GFR: Estimated Creatinine Clearance: 21.6 mL/min (A) (by C-G formula based on SCr of 4.07 mg/dL (H)). Recent Labs  Lab 12/21/19 0231 12/22/19 0341 12/22/19 1411 12/22/19 1548  12/22/19 1845 12/23/19 0436  WBC 18.0* 20.5* 16.4*  --   --  18.2*  LATICACIDVEN  --   --   --  4.9* 2.8*  --     Liver Function Tests: Recent Labs  Lab 12/18/19 0500 12/22/19 1155 12/22/19 1530 12/23/19 0436  AST 26  --  31  --   ALT 32  --  20  --   ALKPHOS 90  --  66  --   BILITOT 0.8  --  0.9  --   PROT 6.5  --  5.4*  --   ALBUMIN 3.1* 2.4* 2.3* 2.6*   No results for input(s): LIPASE, AMYLASE in the last 168 hours. No results for input(s): AMMONIA in the last 168 hours.  ABG    Component Value Date/Time   PHART 7.241 (L) 12/22/2019 1411   PCO2ART 42.6 12/22/2019 1411   PO2ART 42 (L) 12/22/2019 1411   HCO3 18.3 (L) 12/22/2019 1411   TCO2 20 (L) 12/22/2019 1411   ACIDBASEDEF 8.0 (H) 12/22/2019 1411   O2SAT 48.3 12/23/2019 0436     Coagulation Profile: Recent Labs  Lab 12/19/19 1500  INR 1.4*    Cardiac Enzymes: No results for input(s): CKTOTAL, CKMB, CKMBINDEX, TROPONINI in the last 168 hours.  HbA1C: Hgb A1c MFr Bld  Date/Time Value Ref Range Status  12/11/2019 09:40 PM 5.9 (H) 4.8 - 5.6 % Final    Comment:    (NOTE) Pre diabetes:          5.7%-6.4%  Diabetes:              >6.4%  Glycemic control for   <7.0% adults with diabetes   03/26/2013 10:30 PM 5.8 (H) <5.7 % Final    Comment:    (NOTE)                                                                       According to the ADA Clinical Practice Recommendations for 2011, when HbA1c is used as a screening test:  >=6.5%   Diagnostic of Diabetes Mellitus           (if abnormal result is confirmed) 5.7-6.4%   Increased risk of developing Diabetes Mellitus References:Diagnosis and Classification of Diabetes Mellitus,Diabetes JEHU,3149,70(YOVZC 1):S62-S69 and Standards of Medical Care in         Diabetes - 2011,Diabetes Care,2011,34 (Suppl 1):S11-S61.    CBG: Recent Labs  Lab 12/22/19 1545 12/22/19 1947 12/22/19 2338 12/23/19 0435 12/23/19 0653  GLUCAP 173* 113* 137* 147* 126*     Assessment & Plan:   CAD with ischemic cardiomyopathy S/P CABGx5.  Co-ox 48% this am, on milrinone and epinephrine 10mcg min.  Plan: - Continue ASA and atorvastatin - Milirinone .25  - continue epi  - Repeat co-ox ordered to follow up after starting milrinone.   Acute hypoxic respiratory insufficiency.  Sating > 90% on 5 L nasal canula. Should improve with diuresis. CRRT rate increased this am.  Plan: Wean supplemental oxygen as tolerated   AKI requiring CRRT CKD stage III Cr improving with CRRT, 6.46 to 4.07 - Daily RFP  Status post liver transplant  - Possible Tacrolimus toxicity. Holding tacrolimus, level pending and usually takes a few days to return. Patient's mental status improving. Still has tremor this morning. Kidney function improving. Plan: -Continue holding tacrolimus  - follow up tacrolimus level     Daily Goals Checklist  Pain/Anxiety/Delirium protocol (if indicated): minimize VAP protocol (if indicated): not intubated.  Respiratory support goals: nasal cannulae Blood pressure target: titrate for MAP>65 DVT prophylaxis: lovenox Nutritional status and feeding goals: high nutritional risk GI prophylaxis: not indicated.  Fluid status goals: CRRT - run even  Urinary catheter: no catheter Central lines: right introducer, left HD cathe Glucose control: euglycemic Mobility/therapy needs: OOB to chair Antibiotic de-escalation: continue cefepime and vancomycin Home medication reconciliation: on hold Daily labs: CMP, CBC Code Status: full Family Communication: per cardiac surgery Disposition: ICU  Tamsen Snider, MD PGY2

## 2019-12-23 NOTE — Addendum Note (Signed)
Addendum  created 12/23/19 0805 by Josephine Igo, CRNA   Order list changed

## 2019-12-23 NOTE — Progress Notes (Addendum)
Patient ID: Joseph Greer, male   DOB: 1956/01/09, 64 y.o.   MRN: [id number]     Advanced Heart Failure Rounding Note  PCP-Cardiologist: Candee Furbish, MD  AHF:  Dr. [name]   Patient Profile   64 y/o male w/ h/o Hep C s/p liver transplant in 2009 (followed at [facility]), Stage III CKD, HTN and T2DM admitted w/ NSTEMI and Afib w/ RVR, found to have severe 3VD and biventricular heart failure and ? PFO, LVEF 20-25%, RV moderately reduced. S/p CABG + MAZE + LA appendage clipping 7/16.    Subjective:    - 7/16: CABG x 5 with LIMA-LAD, RIMA-ramus, seq SVG-PD/PL, SVG-D; Maze; LA appendage clipping. - 7/18 Limited echo: LVEF 35-40%. RV normal. No pericardial effusion.  - Post operative course c/b AKI and oliguria. CRRT initiated 7/19.  - 7/19 Milrinone discontinued>>changed to dobutamine - 7/19 Developed hypotension w/ increase in pressor support. NE and VP added. Empiric abx added for ?HCAP.  Became increasingly tachycardic and dobutamine stopped overnight.    Co-ox low today at 48%. Seen by TCTS today and miilrinone 0.25 restarted. Continues on Epi 1. Currently off NE and VP. BP stable.  Remains volume overloaded. CVP in the 20s. Up roughly 20 lb above preop wt. On CRRT pulling 50 ml/hr. Remains anuric. K 5.3.   AF. WBC trend 16>>18>>21>>16>>18. CXR pending.     R[facility] Procedural Findings (pre-op): Hemodynamics (mmHg) RA mean 8 RV 55/11 PA 60/30, mean 44 PCWP mean 36 Oxygen saturations: PA 62% AO 97% Cardiac Output (Fick) 5.3  Cardiac Index (Fick) 2.61 Cardiac Output (Thermo) 5.11 Cardiac Index (Thermo) 2.52  PVR 1.6 WU  Objective:   Weight Range: 93.5 kg Body mass index is 29.58 kg/m.   Vital Signs:   Temp:  [97.6 F (36.4 C)-98.3 F (36.8 C)] 98 F (36.7 C) (07/20 0400) Pulse Rate:  [84-143] 88 (07/20 0700) Resp:  [11-25] 21 (07/20 0700) BP: (63-167)/(28-110) 149/61 (07/20 0700) SpO2:  [87 %-100 %] 96 % (07/20 0700) Weight:  [93.5 kg] 93.5 kg (07/20 0449) Last BM  Date: 12/09/19  Weight change: Filed Weights   12/21/19 0500 12/22/19 0500 12/23/19 0449  Weight: 91.9 kg 91.8 kg 93.5 kg    Intake/Output:   Intake/Output Summary (Last 24 hours) at 12/23/2019 0755 Last data filed at 12/23/2019 0700 Gross per 24 hour  Intake 3520.18 ml  Output 2406 ml  Net 1114.18 ml      Physical Exam   CVP 20 General: fatigue appearing/ diaphoretic. no respiratory difficulty  Neck: JVP elevated to ear, no thyromegaly or thyroid nodule. +Rt IJ CVC + Lt IJ HD cath Lungs: normal respiratory effort. Decreased BS at the bases  CV: Nondisplaced PMI.  Heart regular S1/S2, no S3/S4, no murmur.  + CT, No LEE Abdomen: Soft, nontender, no hepatosplenomegaly, no distention.  Skin: Intact without lesions or rashes.  Neurologic: Alert and oriented x 3.  Psych: pleasant, Normal affect. Extremities: No clubbing or cyanosis.  HEENT: Normal. + Cor trak    Telemetry   NSR 80s (personally reviewed)  EKG    No new EKG to review today.   Labs    CBC Recent Labs    12/22/19 1411 12/22/19 1411 12/22/19 1530 12/23/19 0436  WBC 16.4*  --   --  18.2*  HGB 6.6*  7.1*   < > 6.7* 8.7*  HCT 21.5*  21.0*   < > 21.9* 27.6*  MCV 84.3  --   --  84.1  PLT 158  --   --  156   < > = values in this interval not displayed.   Basic Metabolic Panel Recent Labs    12/20/19 1825 12/22/19 1155 12/22/19 1155 12/22/19 1411 12/22/19 1539 12/23/19 0436  NA  --  132*   < > 132*  --  135  K  --  4.9   < > 4.6  --  5.2*  CL  --  99  --   --   --  101  CO2  --  17*  --   --   --  21*  GLUCOSE  --  128*  --   --   --  162*  BUN  --  55*  --   --   --  34*  CREATININE  --  6.46*  --   --   --  4.07*  CALCIUM  --  7.9*  --   --   --  8.0*  MG   < >  --   --   --  2.2 2.4  PHOS   < > 6.9*  --   --  7.3* 5.1*   < > = values in this interval not displayed.   Liver Function Tests Recent Labs    12/22/19 1530 12/23/19 0436  AST 31  --   ALT 20  --   ALKPHOS 66  --    BILITOT 0.9  --   PROT 5.4*  --   ALBUMIN 2.3* 2.6*   No results for input(s): LIPASE, AMYLASE in the last 72 hours. Cardiac Enzymes No results for input(s): CKTOTAL, CKMB, CKMBINDEX, TROPONINI in the last 72 hours.  BNP: BNP (last 3 results) No results for input(s): BNP in the last 8760 hours.  ProBNP (last 3 results) No results for input(s): PROBNP in the last 8760 hours.   D-Dimer No results for input(s): DDIMER in the last 72 hours. Hemoglobin A1C No results for input(s): HGBA1C in the last 72 hours. Fasting Lipid Panel No results for input(s): CHOL, HDL, LDLCALC, TRIG, CHOLHDL, LDLDIRECT in the last 72 hours. Thyroid Function Tests No results for input(s): TSH, T4TOTAL, T3FREE, THYROIDAB in the last 72 hours.  Invalid input(s): FREET3  Other results:   Imaging    DG Chest Port 1 View  Result Date: 12/22/2019 CLINICAL DATA:  Central line placement. EXAM: PORTABLE CHEST 1 VIEW COMPARISON:  December 21, 2019. FINDINGS: Stable cardiomegaly. Right internal jugular venous sheath is unchanged. Right-sided PICC line is unchanged. Interval placement of left internal jugular catheter with distal tip in expected position of the SVC. No pneumothorax or pleural effusion is noted. Bilateral chest tubes are noted in unchanged. Stable left basilar atelectasis or infiltrate is noted. Minimal right basilar subsegmental atelectasis is noted. Bony thorax is unremarkable. IMPRESSION: Interval placement of left internal jugular catheter with distal tip in expected position of the SVC. No pneumothorax is noted. Otherwise stable support apparatus. Electronically Signed   By: Marijo Conception M.D.   On: 12/22/2019 14:53   DG Abd Portable 1V  Result Date: 12/22/2019 CLINICAL DATA:  Feeding tube placement EXAM: PORTABLE ABDOMEN - 1 VIEW COMPARISON:  None. FINDINGS: Nasoenteric feeding tube is seen looped within the gastric fundus. There is mild gaseous distension of the stomach. Multiple gas-filled  loops of small and large bowel are seen within the visualized abdomen suggesting an underlying ileus. Right flank and pelvis is excluded from view. Visualized lung bases are clear. Mild cardiomegaly is noted. IMPRESSION: Nasoenteric feeding tube looped within the gastric  fundus. Mild ileus suspected. Electronically Signed   By: Fidela Salisbury MD   On: 12/22/2019 16:42     Medications:     Scheduled Medications: . Apremilast  1 tablet Oral Daily  . aspirin EC  81 mg Oral Daily  . atorvastatin  80 mg Oral Daily  . B-complex with vitamin C  1 tablet Per Tube Daily  . bisacodyl  10 mg Oral Daily   Or  . bisacodyl  10 mg Rectal Daily  . chlorhexidine gluconate (MEDLINE KIT)  15 mL Mouth Rinse BID  . Chlorhexidine Gluconate Cloth  6 each Topical Daily  . clopidogrel  75 mg Oral Daily  . docusate sodium  200 mg Oral Daily  . enoxaparin (LOVENOX) injection  30 mg Subcutaneous QHS  . feeding supplement (PROSource TF)  45 mL Per Tube BID  . hydrocortisone sod succinate (SOLU-CORTEF) inj  100 mg Intravenous Q8H  . insulin aspart  0-24 Units Subcutaneous Q4H  . pantoprazole  40 mg Oral Daily  . sodium chloride flush  3 mL Intravenous Q12H  . venlafaxine XR  37.5 mg Oral Daily    Infusions: .  prismasol BGK 4/2.5 400 mL/hr at 12/23/19 0324  .  prismasol BGK 4/2.5 300 mL/hr at 12/22/19 1527  . sodium chloride Stopped (12/21/19 1602)  . amiodarone 30 mg/hr (12/23/19 0700)  . ceFEPime (MAXIPIME) IV Stopped (12/22/19 2247)  . epinephrine 1 mcg/min (12/23/19 0700)  . feeding supplement (VITAL 1.5 CAL) 30 mL/hr at 12/23/19 0300  . lactated ringers Stopped (12/19/19 1448)  . lactated ringers Stopped (12/20/19 1152)  . milrinone 0.25 mcg/kg/min (12/23/19 0746)  . norepinephrine (LEVOPHED) Adult infusion Stopped (12/23/19 0248)  . prismasol BGK 4/2.5 1,800 mL/hr at 12/23/19 0500  . vancomycin    . vasopressin 0.01 Units/min (12/23/19 0700)    PRN Medications: heparin, LORazepam, metoprolol  tartrate, ondansetron (ZOFRAN) IV, oxyCODONE, senna-docusate, sodium chloride flush    Assessment/Plan   1. CAD: Severe 3VD on cath 7/21 => 60-70% ostial LAD, 80% mLAD, 95% ramus, 99% proximal LCx with TIMI-2 flow down the rest of the LCx, occluded distal RCA.  Suspect out of hospital inferolateral MI.  Cardiac MRI showed that the inferolateral wall is likely not viable, but the remainder of the LV myocardium is likely viable. He had CABG 7/16 with LIMA-LAD, RIMA-ramus, seq SVG-PD/PL, SVG-D.  - Continue ASA + atorvastatin.   2. Acute on chronic systolic CHF: Echo with EF 20-25%, moderately decreased RV systolic function. Cardiac MRI with LV EF 26%, RV EF 38%, near full thickness scar in inferolateral wall suggesting lack of viability in the LCx territory.  Now post-CABG. Repeat limited echo 7/18 showed LVEF up to 35-40%, RV normal, no pericardial effusion.  - Dobutamine discontinued yesterday.  - Co-ox low today at  48%. Restart milrinone at 0.25. Continue Epi 1. Repeat co-ox at noon.  - Remains volume overloaded. Continue CRRT per nephrology w/ volume removal  3. Atrial fibrillation: Patient admitted with afib/RVR, now in NSR. S/p Maze + LAA clipping on 7/16.  - Continue amiodarone gtt for now.  - Resume anticoagulation when stable per TCTS.  4. AKI on CKD stage 3: Creatine has been stable 1.9-2.3 range in hospital pre-op.  Was 1.3 back in 4/21. Creatinine rose to 6.46 post-op. Anuric. Suspect post-op ATN. CRRT initiated 7/19 - Continue CRRT per nephrology. Watch for renal recovery. SCr improving, down from 6.46>>4.07 but remains anuric - follow BMP  5. Type 2 diabetes: SSI. SGLT2 inhibitor as  outpatient if renal function stabilizes.  6. Liver transplant for HCV: tacrolimus currently on hold (? Toxicity, concomitate use w/ amiodarone can increase levels). Tacrolimus levels pending.  7. ID: 7/19 developed hypotension w/ increasing pressor support, increasing leukocytosis and CXR w/ hazy  opacities. Started on empiric abx for ? HCAP with vancomycin/cefepime. BC NGTD. Repeat CXR pending. Off NE and VP. AF. WBC trending down 21>>18K  8. Hyperkalemia - K 5.3 in the setting of AKI - CRRT per nephrology  Lyda Jester, PA-C 12/23/2019 7:55 AM  Patient seen with PA, agree with the above note.   CVVH started yesterday, pulling net 50 cc/hr. I/Os still positive for the day.  CVP remains > 20.  Co-ox low at 48%.  Milrinone 0.25 restarted this morning, remains on epinephrine 1. MAP stable.   Afebrile with WBCs 18 on empiric vanc/cefepime.    NSR on amiodarone gtt.   Poor appetite, no dyspnea at rest.    General: NAD Neck: lines in both IJs, no thyromegaly or thyroid nodule.  Lungs: Decreased at bases.  CV: Nondisplaced PMI.  Heart regular S1/S2, no S3/S4, no murmur.  1+ ankle edema. Abdomen: Soft, nontender, no hepatosplenomegaly, no distention.  Skin: Intact without lesions or rashes.  Neurologic: Alert and oriented x 3.  Psych: Normal affect. Extremities: No clubbing or cyanosis.  HEENT: Normal.   AKI, now on CVVH.  MAP stable, co-ox 48% this morning so milrinone 0.25 restarted and also on epinephrine 1 (dobutamine stopped overnight with tachycardia).  Pulling 50 cc/hr net negative via CVVH. CVP > 20, weight remains up a lot.  - Need to push UF more, increase to 150 cc/hr net negative UF as BP tolerates today.  - Continue current milrinone + low dose epinephrine for now.   Covering empirically for HCAP with vancomycin/cefepime, afebrile and cultures NGTD.    Remains in NSR on amiodarone, will need anticoagulation when ok with TCTS (h/o atrial fibrillation).   CRITICAL CARE Performed by: Loralie Champagne  Total critical care time: 35 minutes  Critical care time was exclusive of separately billable procedures and treating other patients.  Critical care was necessary to treat or prevent imminent or life-threatening deterioration.  Critical care was time  spent personally by me on the following activities: development of treatment plan with patient and/or surrogate as well as nursing, discussions with consultants, evaluation of patient's response to treatment, examination of patient, obtaining history from patient or surrogate, ordering and performing treatments and interventions, ordering and review of laboratory studies, ordering and review of radiographic studies, pulse oximetry and re-evaluation of patient's condition.  Loralie Champagne 12/23/2019 8:47 AM

## 2019-12-24 ENCOUNTER — Inpatient Hospital Stay (HOSPITAL_COMMUNITY): Payer: Medicare HMO

## 2019-12-24 DIAGNOSIS — Z951 Presence of aortocoronary bypass graft: Secondary | ICD-10-CM | POA: Diagnosis not present

## 2019-12-24 DIAGNOSIS — R570 Cardiogenic shock: Secondary | ICD-10-CM | POA: Diagnosis not present

## 2019-12-24 DIAGNOSIS — I5043 Acute on chronic combined systolic (congestive) and diastolic (congestive) heart failure: Secondary | ICD-10-CM | POA: Diagnosis not present

## 2019-12-24 DIAGNOSIS — N179 Acute kidney failure, unspecified: Secondary | ICD-10-CM | POA: Diagnosis not present

## 2019-12-24 DIAGNOSIS — I214 Non-ST elevation (NSTEMI) myocardial infarction: Secondary | ICD-10-CM | POA: Diagnosis not present

## 2019-12-24 LAB — RENAL FUNCTION PANEL
Albumin: 2.6 g/dL — ABNORMAL LOW (ref 3.5–5.0)
Albumin: 2.5 g/dL — ABNORMAL LOW (ref 3.5–5.0)
Anion gap: 10 (ref 5–15)
Anion gap: 9 (ref 5–15)
BUN: 30 mg/dL — ABNORMAL HIGH (ref 8–23)
BUN: 30 mg/dL — ABNORMAL HIGH (ref 8–23)
CO2: 24 mmol/L (ref 22–32)
CO2: 27 mmol/L (ref 22–32)
Calcium: 8 mg/dL — ABNORMAL LOW (ref 8.9–10.3)
Calcium: 7.9 mg/dL — ABNORMAL LOW (ref 8.9–10.3)
Chloride: 101 mmol/L (ref 98–111)
Chloride: 100 mmol/L (ref 98–111)
Creatinine, Ser: 3.01 mg/dL — ABNORMAL HIGH (ref 0.61–1.24)
Creatinine, Ser: 2.61 mg/dL — ABNORMAL HIGH (ref 0.61–1.24)
GFR calc Af Amer: 25 mL/min — ABNORMAL LOW (ref >60)
GFR calc Af Amer: 29 mL/min — ABNORMAL LOW (ref >60)
GFR calc non Af Amer: 21 mL/min — ABNORMAL LOW (ref >60)
GFR calc non Af Amer: 25 mL/min — ABNORMAL LOW (ref >60)
Glucose, Bld: 235 mg/dL — ABNORMAL HIGH (ref 70–99)
Glucose, Bld: 190 mg/dL — ABNORMAL HIGH (ref 70–99)
Phosphorus: 2.9 mg/dL (ref 2.5–4.6)
Phosphorus: 2.2 mg/dL — ABNORMAL LOW (ref 2.5–4.6)
Potassium: 4.8 mmol/L (ref 3.5–5.1)
Potassium: 4.4 mmol/L (ref 3.5–5.1)
Sodium: 135 mmol/L (ref 135–145)
Sodium: 136 mmol/L (ref 135–145)

## 2019-12-24 LAB — GLUCOSE, CAPILLARY
Glucose-Capillary: 174 mg/dL — ABNORMAL HIGH (ref 70–99)
Glucose-Capillary: 193 mg/dL — ABNORMAL HIGH (ref 70–99)
Glucose-Capillary: 217 mg/dL — ABNORMAL HIGH (ref 70–99)
Glucose-Capillary: 263 mg/dL — ABNORMAL HIGH (ref 70–99)
Glucose-Capillary: 208 mg/dL — ABNORMAL HIGH (ref 70–99)
Glucose-Capillary: 77 mg/dL (ref 70–99)
Glucose-Capillary: 313 mg/dL — ABNORMAL HIGH (ref 70–99)

## 2019-12-24 LAB — COOXEMETRY PANEL
Carboxyhemoglobin: 1.4 % (ref 0.5–1.5)
Methemoglobin: 1.2 % (ref 0.0–1.5)
O2 Saturation: 64.5 %
Total hemoglobin: 8.9 g/dL — ABNORMAL LOW (ref 12.0–16.0)

## 2019-12-24 LAB — CBC
HCT: 27.4 % — ABNORMAL LOW (ref 39.0–52.0)
Hemoglobin: 8.6 g/dL — ABNORMAL LOW (ref 13.0–17.0)
MCH: 26.3 pg (ref 26.0–34.0)
MCHC: 31.4 g/dL (ref 30.0–36.0)
MCV: 83.8 fL (ref 80.0–100.0)
Platelets: 156 10*3/uL (ref 150–400)
RBC: 3.27 MIL/uL — ABNORMAL LOW (ref 4.22–5.81)
RDW: 16.2 % — ABNORMAL HIGH (ref 11.5–15.5)
WBC: 13.9 10*3/uL — ABNORMAL HIGH (ref 4.0–10.5)
nRBC: 2.3 % — ABNORMAL HIGH (ref 0.0–0.2)

## 2019-12-24 LAB — MAGNESIUM: Magnesium: 2.6 mg/dL — ABNORMAL HIGH (ref 1.7–2.4)

## 2019-12-24 MED ORDER — AMIODARONE HCL 200 MG PO TABS
200.0000 mg | ORAL_TABLET | Freq: Every day | ORAL | Status: DC
  Administered 2019-12-24 – 2020-01-01 (×9): 200 mg via ORAL
  Filled 2019-12-24 (×9): qty 1

## 2019-12-24 MED ORDER — CHLORHEXIDINE GLUCONATE 0.12 % MT SOLN
OROMUCOSAL | Status: AC
  Filled 2019-12-24: qty 15

## 2019-12-24 MED ORDER — TACROLIMUS 1 MG PO CAPS
1.0000 mg | ORAL_CAPSULE | Freq: Two times a day (BID) | ORAL | Status: DC
  Administered 2019-12-24 – 2020-01-01 (×16): 1 mg via ORAL
  Filled 2019-12-24 (×19): qty 1

## 2019-12-24 MED ORDER — HYDROCORTISONE NA SUCCINATE PF 100 MG IJ SOLR
50.0000 mg | Freq: Three times a day (TID) | INTRAMUSCULAR | Status: AC
  Administered 2019-12-24 – 2019-12-25 (×3): 50 mg via INTRAVENOUS
  Filled 2019-12-24 (×3): qty 2

## 2019-12-24 MED ORDER — INSULIN ASPART 100 UNIT/ML ~~LOC~~ SOLN: 4.0000 [IU] | SUBCUTANEOUS | Status: DC | PRN

## 2019-12-24 MED ORDER — INSULIN ASPART 100 UNIT/ML ~~LOC~~ SOLN: 3.0000 [IU] | SUBCUTANEOUS | Status: DC | PRN

## 2019-12-24 NOTE — Plan of Care (Signed)
  Problem: Clinical Measurements: Goal: Ability to maintain clinical measurements within normal limits will improve Outcome: Progressing Goal: Will remain free from infection Outcome: Progressing Goal: Diagnostic test results will improve Outcome: Progressing Goal: Respiratory complications will improve Outcome: Progressing Goal: Cardiovascular complication will be avoided Outcome: Progressing   Problem: Nutrition: Goal: Adequate nutrition will be maintained Outcome: Progressing   Problem: Activity: Goal: Risk for activity intolerance will decrease Outcome: Progressing   Problem: Coping: Goal: Level of anxiety will decrease Outcome: Progressing

## 2019-12-24 NOTE — Progress Notes (Signed)
Rehab Admissions Coordinator Note:  Patient was screened by Cleatrice Burke for appropriateness for an Inpatient Acute Rehab Consult per PT recommendations.   At this time, we are recommending Inpatient Rehab consult if patient fails to progress. Please advise.  Cleatrice Burke RN MSN 12/24/2019, 12:55 PM  I can be reached at [phone].

## 2019-12-24 NOTE — Evaluation (Addendum)
Physical Therapy Evaluation Patient Details Name: Joseph Greer MRN: [id number] DOB: 01-08-1956 Today's Date: 12/24/2019   History of Present Illness  Pt is a 64 y.o. male admitted 12/11/19 for NSTEMI; found to have severe biventricular failure and multivessel CAD. S/p CABG x5, TEE, maze for afib on 7/16. Post operative course complicated by persistent LV dysfunction and AKI. CRRT initiated 7/19. PMH includes CKD III, DM2, HTN, s/p liver transplant (2009), Hep C, anxiety, depression.    Clinical Impression  Pt presents with an overall decrease in functional mobility secondary to above. PTA, pt independent, works, exercises 3x/wk, and lives with adult son. Educ on sternal precautions and importance of mobility. Today, pt able to initiate transfer and gait training, requiring up to modA (+2 safety). Pt limited by slowed processing, decreased activity tolerance and poor balance strategies/postural reactions; at high risk for falls. Pt would benefit from intensive CIR-level therapies to maximize functional mobility and independence prior to return home.    Follow Up Recommendations CIR;Supervision for mobility/OOB    Equipment Recommendations   (TBD)    Recommendations for Other Services   SLP (Cognition)    Precautions / Restrictions Precautions Precautions: Sternal;Fall;Other (comment) Precaution Comments: CRRT (paused during session), chest tube Restrictions Weight Bearing Restrictions: Yes (sternal precautions) Other Position/Activity Restrictions: sternal precautions      Mobility  Bed Mobility               General bed mobility comments: Received sitting in recliner  Transfers Overall transfer level: Needs assistance Equipment used: 4-wheeled walker (eva walker) Transfers: Sit to/from Stand Sit to Stand: Min assist;Mod assist;+2 safety/equipment         General transfer comment: Min-modA+2 to stand from recliner and BSC, repeated cues for sequencing while  maintaining sternal precautions  Ambulation/Gait Ambulation/Gait assistance: Min assist Gait Distance (Feet): 210 Feet Assistive device: 4-wheeled walker Gait Pattern/deviations: Step-to pattern;Step-through pattern;Decreased stride length;Shuffle;Trunk flexed Gait velocity: Decreased Gait velocity interpretation: <1.31 ft/sec, indicative of household ambulator General Gait Details: Initial shuffling gait, requiring repeated cues throughout ambulation distance to increase step length, pt starting to repeat "big steps" and at times self-correct. 1x seated rest break to void on BSC. +2 assist for chair follow. VSS on 4L O2   Stairs            Wheelchair Mobility    Modified Rankin (Stroke Patients Only)       Balance Overall balance assessment: Needs assistance   Sitting balance-Leahy Scale: Fair       Standing balance-Leahy Scale: Poor Standing balance comment: Reliant on UE support or external assist to maintain static standing balance; assist to perform posterior pericare                             Pertinent Vitals/Pain Pain Assessment: Faces Faces Pain Scale: Hurts a little bit Pain Location: Sternal incision Pain Descriptors / Indicators: Guarding;Discomfort Pain Intervention(s): Monitored during session    Home Living Family/patient expects to be discharged to:: Private residence Living Arrangements: Children Available Help at Discharge: Family;Available PRN/intermittently Type of Home: House Home Access: Stairs to enter     Home Layout: One level   Additional Comments: Lives with 59 y.o. son who works during day. 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 Exercises 3x/wk (runs, strength training). Drives     Hand Dominance         Extremity/Trunk Assessment   Upper Extremity Assessment Upper Extremity Assessment: Overall WFL for tasks assessed (within limits of sternal precautions)    Lower Extremity Assessment Lower Extremity Assessment: Generalized weakness       Communication   Communication: No difficulties  Cognition Arousal/Alertness: Awake/alert Behavior During Therapy: WFL for tasks assessed/performed;Flat affect Overall Cognitive Status: No family/caregiver present to determine baseline cognitive functioning Area of Impairment: Attention;Memory;Following commands;Safety/judgement;Awareness;Problem solving                   Current Attention Level: Sustained;Selective Memory: Decreased recall of precautions Following Commands: Follows one step commands consistently;Follows one step commands with increased time;Follows multi-step commands inconsistently Safety/Judgement: Decreased awareness of deficits Awareness: Emergent Problem Solving: Slow processing;Requires verbal cues General Comments: RN reports disoriented to place this morning, but now able to state Tice comments (skin integrity, edema, etc.): SpO2 91-97% on 4L O2 [place], HR 96, resting BP 117/81, post-ambulation BP 116/64    Exercises     Assessment/Plan    PT Assessment Patient needs continued PT services  PT Problem List Decreased strength;Decreased activity tolerance;Decreased balance;Decreased mobility;Decreased cognition;Decreased knowledge of use of DME;Decreased safety awareness;Decreased knowledge of precautions;Cardiopulmonary status limiting activity;Pain       PT Treatment Interventions DME instruction;Gait training;Stair training;Functional mobility training;Therapeutic activities;Therapeutic exercise;Balance training;Patient/family education;Cognitive remediation    PT Goals (Current goals can be found in the Care Plan section)  Acute Rehab PT Goals Patient Stated  Goal: Get back to work and exercising PT Goal Formulation: With patient Time For Goal Achievement: 01/07/20 Potential to Achieve Goals: Good    Frequency Min 3X/week   Barriers to discharge        Co-evaluation               AM-PAC PT "6 Clicks" Mobility  Outcome Measure Help needed turning from your back to your side while in a flat bed without using bedrails?: A Lot Help needed moving from lying on your back to sitting on the side of a flat bed without using bedrails?: A Lot Help needed moving to and from a bed to a chair (including a wheelchair)?: A Lot Help needed standing up from a chair using your arms (e.g., wheelchair or bedside chair)?: A Lot Help needed to walk in hospital room?: A Little Help needed climbing 3-5 steps with a railing? : A Lot 6 Click Score: 13    End of Session Equipment Utilized During Treatment: Gait belt Activity Tolerance: Patient tolerated treatment well Patient left: in chair;with call bell/phone within reach;with chair alarm set;with nursing/sitter in room Nurse Communication: Mobility status PT Visit Diagnosis: Other abnormalities of gait and mobility (R26.89);Difficulty in walking, not elsewhere classified (R26.2)    Time: 5597-4163 PT Time Calculation (min) (ACUTE ONLY): 26 min   Charges:   PT Evaluation $PT Eval Moderate Complexity: 1 Mod PT Treatments $Gait Training: 8-22 mins   Mabeline Caras, PT, DPT Acute Rehabilitation Services  Pager [phone] Office [phone]  Derry Lory 12/24/2019, 11:41 AM

## 2019-12-24 NOTE — Progress Notes (Signed)
TCTS DAILY ICU PROGRESS NOTE                   [address]            [place] [address]          [phone]   5 Days Post-Op Procedure(s) (LRB): CORONARY ARTERY BYPASS GRAFTING (CABG) TIMES  X 5,   USING BILATERAL MAMMARY ARTERY AND RIGHT LEG GREATER SAPHENOUS VEIN HARVESTED ENDOSCOPICALLY (N/A) TRANSESOPHAGEAL ECHOCARDIOGRAM (TEE) (N/A) INDOCYANINE GREEN FLUORESCENCE IMAGING (ICG) (N/A) MAZE (N/A) CLIPPING OF ATRIAL APPENDAGE USING ATRICURE CLIP SIZE 50MM (N/A)  Total Length of Stay:  LOS: 12 days   Subjective: Feeling better , confusion improving  Objective: Vital signs in last 24 hours: Temp:  [97.2 F (36.2 C)-98.6 F (37 C)] 97.2 F (36.2 C) (07/21 0400) Pulse Rate:  [80-99] 96 (07/21 0703) Cardiac Rhythm: Normal sinus rhythm (07/21 0400) Resp:  [16-24] 23 (07/21 0703) BP: (97-161)/(50-98) 145/62 (07/21 0703) SpO2:  [90 %-98 %] 91 % (07/21 0703) Weight:  [89.4 kg] 89.4 kg (07/21 0453)  Filed Weights   12/22/19 0500 12/23/19 0449 12/24/19 0453  Weight: 91.8 kg 93.5 kg 89.4 kg    Weight change: -4.1 kg   Hemodynamic parameters for last 24 hours: CVP:  [11 mmHg-32 mmHg] 11 mmHg  Intake/Output from previous day: 07/20 0701 - 07/21 0700 In: 2733.2 [P.O.:468; I.V.:715.1; NG/GT:1150; IV Piggyback:400.1] Out: 6234 [Chest Tube:220]  Intake/Output this shift: No intake/output data recorded.  Current Meds: Scheduled Meds: . Apremilast  1 tablet Oral Daily  . aspirin EC  81 mg Oral Daily  . atorvastatin  80 mg Oral Daily  . B-complex with vitamin C  1 tablet Per Tube Daily  . bisacodyl  10 mg Oral Daily   Or  . bisacodyl  10 mg Rectal Daily  . chlorhexidine gluconate (MEDLINE KIT)  15 mL Mouth Rinse BID  . Chlorhexidine Gluconate Cloth  6 each Topical Daily  . clopidogrel  75 mg Oral Daily  . docusate sodium  200 mg Oral Daily  . enoxaparin (LOVENOX) injection  30 mg Subcutaneous QHS  . feeding supplement (PROSource TF)  45 mL Per Tube  BID  . hydrocortisone sod succinate (SOLU-CORTEF) inj  100 mg Intravenous Q8H  . insulin aspart  0-24 Units Subcutaneous Q4H  . pantoprazole  40 mg Oral Daily  . sodium chloride flush  3 mL Intravenous Q12H  . venlafaxine XR  37.5 mg Oral Daily   Continuous Infusions: .  prismasol BGK 4/2.5 400 mL/hr at 12/24/19 0453  . sodium chloride Stopped (12/21/19 1602)  . amiodarone 30 mg/hr (12/24/19 0700)  . ceFEPime (MAXIPIME) IV Stopped (12/23/19 2259)  . epinephrine 1 mcg/min (12/24/19 0700)  . feeding supplement (VITAL 1.5 CAL) 60 mL/hr at 12/24/19 0300  . lactated ringers Stopped (12/19/19 1448)  . lactated ringers Stopped (12/20/19 1152)  . milrinone 0.375 mcg/kg/min (12/24/19 0718)  . norepinephrine (LEVOPHED) Adult infusion Stopped (12/23/19 0248)  . prismasol BGK 2/2.5 replacement solution 300 mL/hr at 12/24/19 0343  . prismasol BGK 4/2.5 1,800 mL/hr at 12/24/19 8101  . vancomycin Stopped (12/23/19 1301)  . vasopressin Stopped (12/23/19 0702)   PRN Meds:.heparin, LORazepam, metoprolol tartrate, ondansetron (ZOFRAN) IV, oxyCODONE, senna-docusate, sodium chloride flush  General appearance: alert, cooperative, distracted, no distress and some tremor Heart: regular rate and rhythm Lungs: clear anteriorly Abdomen: benign Extremities: no edema LE's Wound: incis healing well  Lab Results: CBC: Recent Labs    12/23/19 0436  12/24/19 0433  WBC 18.2* 13.9*  HGB 8.7* 8.6*  HCT 27.6* 27.4*  PLT 156 156   BMET:  Recent Labs    12/23/19 1544 12/24/19 0433  NA 134* 135  K 4.8 4.8  CL 99 101  CO2 24 24  GLUCOSE 240* 235*  BUN 29* 30*  CREATININE 3.24* 3.01*  CALCIUM 7.8* 8.0*    CMET: Lab Results  Component Value Date   WBC 13.9 (H) 12/24/2019   HGB 8.6 (L) 12/24/2019   HCT 27.4 (L) 12/24/2019   PLT 156 12/24/2019   GLUCOSE 235 (H) 12/24/2019   CHOL 219 (H) 12/12/2019   TRIG 117 12/12/2019   HDL 38 (L) 12/12/2019   LDLCALC 158 (H) 12/12/2019   ALT 20 12/22/2019    AST 31 12/22/2019   NA 135 12/24/2019   K 4.8 12/24/2019   CL 101 12/24/2019   CREATININE 3.01 (H) 12/24/2019   BUN 30 (H) 12/24/2019   CO2 24 12/24/2019   TSH 4.855 (H) 12/11/2019   INR 1.4 (H) 12/19/2019   HGBA1C 5.9 (H) 12/11/2019      PT/INR: No results for input(s): LABPROT, INR in the last 72 hours. Radiology: DG CHEST PORT 1 VIEW  Result Date: 12/23/2019 CLINICAL DATA:  Chest tube EXAM: PORTABLE CHEST 1 VIEW COMPARISON:  Portable exam 0803 hours compared to 12/22/2019 FINDINGS: Feeding tube coiled in stomach. External pacing leads project over chest. LEFT jugular line tip projecting over SVC. RIGHT jugular line tip projecting over brachiocephalic vein at confluence of SVC. RIGHT arm PICC line tip projects over SVC. BILATERAL thoracostomy tubes present. Enlargement of cardiac silhouette with prominent mediastinum unchanged. Slight rotation to the RIGHT. Elevation of LEFT diaphragm with persistent LEFT basilar atelectasis. No definite acute infiltrate, pleural effusion, or pneumothorax. IMPRESSION: Persistent LEFT basilar atelectasis. Line and tube positions as above. Electronically Signed   By: Ulyses Southward M.D.   On: 12/23/2019 09:51   ECHOCARDIOGRAM LIMITED  Result Date: 12/23/2019    ECHOCARDIOGRAM LIMITED REPORT   Patient Name:   HAARIS METALLO [facility] Date of Exam: 12/23/2019 Medical Rec #:  [id number]            Height:       70.0 in Accession #:    [id number]           Weight:       206.1 lb Date of Birth:  November 30, 1955            BSA:          2.114 m Patient Age:    64 years             BP:           123/66 mmHg Patient Gender: M                    HR:           86 bpm. Exam Location:  Inpatient Procedure: Limited Echo and Color Doppler Indications:    Tamponade 423.3 / I31.4  History:        Patient has prior history of Echocardiogram examinations, most                 recent 12/21/2019. Previous Myocardial Infarction, Prior CABG;                 Risk Factors:Hypertension and Former  Smoker.  Sonographer:    Ross Ludwig RDCS (AE) Referring Phys: [id number] [facility] Z ATKINS  Sonographer Comments:  Drainage bandages in subcostal area. IMPRESSIONS  1. Limited study for tamponade. Very difficult study due to poor windows. No pericardial effusion present. LVEF severely reduced with global hypokinesis. There is severe hypokinesis of the entire lateral and inferior walls. EF reduced to 20-25% compared  with 12/21/2019 which was likely 30-35%.  2. Left ventricular ejection fraction, by estimation, is 20 to 25%. The left ventricle has severely decreased function.  3. The mitral valve is grossly normal. Trivial mitral valve regurgitation. No evidence of mitral stenosis.  4. The aortic valve is grossly normal. FINDINGS  Left Ventricle: Left ventricular ejection fraction, by estimation, is 20 to 25%. The left ventricle has severely decreased function.  LV Wall Scoring: The entire lateral wall and entire inferior wall are hypokinetic. Pericardium: There is no evidence of pericardial effusion. Mitral Valve: The mitral valve is grossly normal. Trivial mitral valve regurgitation. No evidence of mitral valve stenosis. Aortic Valve: The aortic valve is grossly normal. Pulmonic Valve: The pulmonic valve was grossly normal. Pulmonic valve regurgitation is not visualized. Additional Comments: Limited study for tamponade. Very difficult study due to poor windows. No pericardial effusion present. LVEF severely reduced with global hypokinesis. There is severe hypokinesis of the entire lateral and inferior walls. EF reduced to 20-25% compared with 12/21/2019 which was likely 30-35%. Eleonore Chiquito MD Electronically signed by Eleonore Chiquito MD Signature Date/Time: 12/23/2019/10:26:34 AM    Final      Assessment/Plan: S/P Procedure(s) (LRB): CORONARY ARTERY BYPASS GRAFTING (CABG) TIMES  X 5,   USING BILATERAL MAMMARY ARTERY AND RIGHT LEG GREATER SAPHENOUS VEIN HARVESTED ENDOSCOPICALLY (N/A) TRANSESOPHAGEAL ECHOCARDIOGRAM  (TEE) (N/A) INDOCYANINE GREEN FLUORESCENCE IMAGING (ICG) (N/A) MAZE (N/A) CLIPPING OF ATRIAL APPENDAGE USING ATRICURE CLIP SIZE 50MM (N/A)  1 slow but steady improvement POD#5 2 on epi/milrinone- CO-OX improved to 64.5 today, wean epi as able. SBP quite variable 90-160's range. CVP improving. AHF team manageing acute on chronic systolic  HF 3 renal fn/volume overload cont to improve on CRRT- nephrology managing  4 afib- sinus with amio gtt 5 need to determine timing of tacrolimus restart 6 no fevers- leukocytosis trend improving 7 anemia has stabilized 8 poss d.c pleural tubes soon- 220 cc yesterday 9 cont SSI for elevated BS 10 PCCM assisting with respiratory critical care management- cont to wean O2 as able 11 TF's , poss advance diet soon- current on clears John Giovanni PA-C Pager [phone] 12/24/2019 7:45 AM

## 2019-12-24 NOTE — Progress Notes (Signed)
      [address]       [place] [address]             [phone]      POD # 5 CABG x 5, Maze, LAA clip  Resting comfortably  BP 117/66   Pulse 96   Temp 97.7 F (36.5 C) (Oral)   Resp (!) 26   Ht 5\' 10"  (1.778 m)   Wt 89.4 kg   SpO2 97%   BMI 28.28 kg/m  3L [place] 98% sat Milrinone 0.375, epi off  Intake/Output Summary (Last 24 hours) at 12/24/2019 1724 Last data filed at 12/24/2019 1600 Gross per 24 hour  Intake 1982.84 ml  Output 5317 ml  Net -3334.16 ml   Creatinine down from 3.0 to 2.6 K= 4.4  Nina Hoar C. Roxan Hockey, MD [facility] [phone]

## 2019-12-24 NOTE — Progress Notes (Signed)
Medication Management  63yom admitted 7/8  from [facility] s/p 7-8day Hx SOB> cath 3v CAD EF 30% > CABG 7/16, pre/op/post op Afib started on amiodarone drip 7/  Patient has Hx liver transplant in [place] and is followed by [facility] liver transplant team.  PTA tacrolimus 2mg  BID with last outpatient level on 4/21 result 3.1 at goal 2-3 per [facility]   Tacrolimus 2mg  BID continued inpateint and level 8.3 7/14, and 8.9 7/15 - this level drawn 2hr after dose given, level rechecked 6.6 7/18 drawn correctly and then tacrolimus held for tremor/nausea hypotension  and concern for toxicity.    7/21 Spoke to [facility] transplant team Recommend resuming tacrolimus 1mg  BID Recheck LFTs and tacro level in 48hr and call them with results  [phone] - will follow up when level results - aware this is a send out and will take a few days for results Orders entered  Harmony.D. CPP, BCPS Clinical Pharmacist [phone] 12/24/2019 1:24 PM

## 2019-12-24 NOTE — Progress Notes (Signed)
Joseph Greer Admit Date: 12/11/2019 12/24/2019 Joseph Greer Requesting Physician:  [name] MD  Reason for Consult:  AKI on CKD3 SUBJECTIVE Still requiring epinephrine. Anuric. Patient reports feeling significantly better. Has an appetite now and does not feel as confused. Patient reports as if Joseph Greer breathing is better as well. Tolerating crrt  CRRT Modality-CVVHDF, M100 dialyzer Pre: 400 Post 300 BFR 25m/hr DFR1.8L/hr UF goal: net neg 150cc/hr; UF rate: 2562mhr FF 10% Access: LIJ temporary dialysis catheter   Creatinine, Ser (mg/dL)  Date Value  12/24/2019 3.01 (H)  12/23/2019 3.24 (H)  12/23/2019 4.07 (H)  12/22/2019 6.46 (H)  12/22/2019 5.85 (H)  12/21/2019 4.99 (H)  12/21/2019 4.22 (H)  12/20/2019 3.57 (H)  12/20/2019 2.58 (H)  12/19/2019 2.30 (H)  ] I/Os: I/O last 3 completed shifts: In: 4174.2 [P.O.:468; I.V.:1106.2; Blood:650; NG/GT:1430; IV Piggyback:520.1] Out: 824098Other:7915; Chest Tube:335]   ROS Balance of 12 systems is negative w/ exceptions as above  PMH  Past Medical History:  Diagnosis Date   Anxiety    CKD (chronic kidney disease), stage III    Depression    Diabetes mellitus type 2, noninsulin dependent (HCC)    Hepatitis C    Hypertension    Psoriasis    [place]:  Procedure Laterality Date   CLIPPING OF ATRIAL APPENDAGE N/A 12/19/2019   Procedure: CLIPPING OF ATRIAL APPENDAGE USING ATRICURE CLIP SIZE 50MM;  Surgeon: AtWonda OldsMD;  [place] [facility] Service: Open [facility] Surgery;  Laterality: N/A;   COLONOSCOPY     COLONOSCOPY WITH PROPOFOL N/A 06/01/2017   Procedure: COLONOSCOPY WITH PROPOFOL;  Surgeon: ReRogene HoustonMD;  Location[facility] SU[facility];  Service: Endoscopy;  Laterality: N/A;  7:30   CORONARY ARTERY BYPASS GRAFT N/A 12/19/2019   Procedure: CORONARY ARTERY BYPASS GRAFTING (CABG) TIMES  X 5,   USING BILATERAL MAMMARY ARTERY AND RIGHT LEG GREATER  SAPHENOUS VEIN HARVESTED ENDOSCOPICALLY;  Surgeon: AtWonda OldsMD;  [place] Service: Open Heart Surgery;  Laterality: N/A;   HERNIA REPAIR Right    LEFT HEART CATH AND CORONARY ANGIOGRAPHY N/A 12/12/2019   Procedure: LEFT HEART CATH AND CORONARY ANGIOGRAPHY;  Surgeon: VaJettie BoozeMD;  [place] [facility];  Service: Cardiovascular;  Laterality: N/A;   LIVER BIOPSY     LIVER SURGERY     MAZE N/A 12/19/2019   Procedure: MAZE;  Surgeon: AtWonda OldsMD;  [place]: [facility] Service: Open Heart Surgery;  Laterality: N/A;   POLYPECTOMY  06/01/2017   Procedure: POLYPECTOMY;  Surgeon: ReRogene HoustonMD;  [facility];  Service: Endoscopy;;  polyp at sigmoid colon x2, ascending colon polyp, hepatic flexure polyp   RIGHT HEART CATH N/A 12/17/2019   Procedure: RIGHT HEART CATH;  Surgeon: McLarey DresserMD;  [place] [facility];  Service: Cardiovascular;  Laterality: N/A;   TEE WITHOUT CARDIOVERSION N/A 12/19/2019   Procedure: TRANSESOPHAGEAL ECHOCARDIOGRAM (TEE);  Surgeon: AtWonda OldsMD;  [place]: [place] Service: Open Heart Surgery;  Laterality: N/A;   UPPER GASTROINTESTINAL ENDOSCOPY     FH  Family History  Problem Relation Age of Onset   Diabetes Mother    Heart disease Father    Heart disease Son    SHSiletzreports that he quit smoking about 12 years ago. Joseph Greer smoking use included cigarettes. He smoked 1.00 pack per day. He has  never used smokeless tobacco. He reports that he does not drink alcohol and does not use drugs. Allergies No Known Allergies Home medications Prior to Admission medications   Medication Sig Start Date End Date Taking? Authorizing Provider  amLODipine (NORVASC) 5 MG tablet Take 5 mg by mouth daily. 10/28/19  Yes [provider]  Apremilast (OTEZLA) 30 MG TABS Take 1 tablet by mouth 2 (two) times daily. 06/16/19  Yes [provider]  LORazepam (ATIVAN) 1 MG tablet Take 1 mg by  mouth 2 (two) times daily as needed (panic attacks).  11/28/19  Yes [provider]  nitroGLYCERIN (NITROSTAT) 0.4 MG SL tablet Place 1 tablet (0.4 mg total) under the tongue every 5 (five) minutes x 3 doses as needed for chest pain. 03/27/13  Yes Rai, Ripudeep K, MD  tacrolimus (PROGRAF) 1 MG capsule Take 2 mg by mouth 2 (two) times daily.    Yes [provider]  venlafaxine XR (EFFEXOR-XR) 75 MG 24 hr capsule Take 75 mg by mouth daily.   Yes [provider]  buPROPion (WELLBUTRIN) 75 MG tablet Take 75 mg by mouth 2 (two) times daily.  Patient not taking: Reported on 12/11/2019    [provider]  FLUoxetine (PROZAC) 40 MG capsule Take 40 mg by mouth daily. Patient not taking: Reported on 12/11/2019    [provider]  lisinopril (PRINIVIL,ZESTRIL) 20 MG tablet Take 20 mg by mouth daily. Patient not taking: Reported on 12/11/2019    [provider]  mycophenolate (CELLCEPT) 250 MG capsule Take 250 mg by mouth 2 (two) times daily.  Patient not taking: Reported on 12/11/2019    [provider]  triamcinolone ointment (KENALOG) 0.1 % Apply 1 application topically 2 (two) times daily. Patient not taking: Reported on 12/11/2019    [provider]    Current Medications Scheduled Meds:  Apremilast  1 tablet Oral Daily   aspirin EC  81 mg Oral Daily   atorvastatin  80 mg Oral Daily   B-complex with vitamin C  1 tablet Per Tube Daily   bisacodyl  10 mg Oral Daily   Or   bisacodyl  10 mg Rectal Daily   chlorhexidine gluconate (MEDLINE KIT)  15 mL Mouth Rinse BID   Chlorhexidine Gluconate Cloth  6 each Topical Daily   clopidogrel  75 mg Oral Daily   docusate sodium  200 mg Oral Daily   enoxaparin (LOVENOX) injection  30 mg Subcutaneous QHS   feeding supplement (PROSource TF)  45 mL Per Tube BID   hydrocortisone sod succinate (SOLU-CORTEF) inj  100 mg Intravenous Q8H   insulin aspart  0-24 Units Subcutaneous Q4H    pantoprazole  40 mg Oral Daily   sodium chloride flush  3 mL Intravenous Q12H   venlafaxine XR  37.5 mg Oral Daily   Continuous Infusions:   prismasol BGK 4/2.5 400 mL/hr at 12/24/19 0453   sodium chloride Stopped (12/21/19 1602)   amiodarone 30 mg/hr (12/24/19 0700)   ceFEPime (MAXIPIME) IV Stopped (12/23/19 2259)   epinephrine 1 mcg/min (12/24/19 0700)   feeding supplement (VITAL 1.5 CAL) 60 mL/hr at 12/24/19 0300   lactated ringers Stopped (12/19/19 1448)   lactated ringers Stopped (12/20/19 1152)   milrinone 0.375 mcg/kg/min (12/24/19 0718)   norepinephrine (LEVOPHED) Adult infusion Stopped (12/23/19 0248)   prismasol BGK 2/2.5 replacement solution 300 mL/hr at 12/24/19 0343   prismasol BGK 4/2.5 1,800 mL/hr at 12/24/19 0620   vancomycin Stopped (12/23/19 1301)   vasopressin  Stopped (12/23/19 0702)   PRN Meds:.heparin, LORazepam, metoprolol tartrate, ondansetron (ZOFRAN) IV, oxyCODONE, senna-docusate, sodium chloride flush  CBC Recent Labs  Lab 12/22/19 1411 12/22/19 1411 12/22/19 1530 12/23/19 0436 12/24/19 0433  WBC 16.4*  --   --  18.2* 13.9*  HGB 6.6*   7.1*   < > 6.7* 8.7* 8.6*  HCT 21.5*   21.0*   < > 21.9* 27.6* 27.4*  MCV 84.3  --   --  84.1 83.8  PLT 158  --   --  156 156   < > = values in this interval not displayed.   Basic Metabolic Panel Recent Labs  Lab 12/21/19 0231 12/21/19 0231 12/21/19 1513 12/22/19 0341 12/22/19 1155 12/22/19 1411 12/22/19 1539 12/23/19 0436 12/23/19 1544 12/24/19 0433  NA 135   < > 129* 131* 132* 132*  --  135 134* 135  K 5.5*   < > 4.7 4.8 4.9 4.6  --  5.2* 4.8 4.8  CL 104  --  99 98 99  --   --  101 99 101  CO2 21*  --  18* 19* 17*  --   --  21* 24 24  GLUCOSE 152*  --  186* 134* 128*  --   --  162* 240* 235*  BUN 35*  --  43* 51* 55*  --   --  34* 29* 30*  CREATININE 4.22*  --  4.99* 5.85* 6.46*  --   --  4.07* 3.24* 3.01*  CALCIUM 7.8*  --  7.5* 7.6* 7.9*  --   --  8.0* 7.8* 8.0*  PHOS  --   --   --    --  6.9*  --  7.3* 5.1* 3.8 2.9   < > = values in this interval not displayed.    Physical Exam  Blood pressure (!) 145/62, pulse 96, temperature (!) 97.3 F (36.3 C), temperature source Oral, resp. rate (!) 23, height 5' 10" (1.778 m), weight 89.4 kg, SpO2 91 %. GEN: NAD, sitting up in bed, conversant Neck: LIJ temp dialysis catheter: c/d/i CV: rrr, normal S1-S2 PULM: Diminished air entry left greater than right base, no overt w/r/r/c, unlabored, bilateral chest expansion, +chest tube ABD: Soft, nontender nondistended SKIN: No rashes or lesions , midchest wall incision c/d/i EXT: trace edema bilateral le's NEURO: nonfocal AAO x3, speech clear and coherent   Assessment 41M with oliguric AoCKD3 in setting of LHF 7/9 and 5V CABG 7/16 + MAZE.  Suspect this is ATN following cardiac surgery  1. AoCKD3 (now anuric), BL SCr 1.2 to 1.5, no outpt nephrology; suspect ATN; exhibiting early uremic symptoms with loss of appetite along with nausea/vomiting (Cr continues to rise). No response in urine output with diuretics initially. Started CRRT on 12/22/2019 for uremic symptoms and volume. 2. NSTEMI status post 5 vessel CABG 7/16 3. Atrial fibrillation status post maze with left atrial appendage clipping 7/16.  On amiodarone 4. Acute on chronic sCHF/cardiogenic shock on epinephrine and milrinone 5. Status post liver transplant 2009 ([facility]) on tacrolimus followed by [facility] 6. Hypertension 7. DM2  Plan 1. Continue with CRRT for now. k better with postfilter 2k bath. Maintaining net neg 150cc/hr per hf team 2. Will continue to monitor for renal recovery moving forward 3. Recommend primary service touching base with Joseph Greer outpatient liver transplant time in regards to Joseph Greer tacrolimus 4. Daily weights, Strict I/Os, Avoid nephrotoxins (NSAIDs, judicious IV Contrast)   Joseph Quint, MD [facility] 12/24/2019, 8:23 AM

## 2019-12-24 NOTE — Progress Notes (Signed)
Inpatient Diabetes Program Recommendations  AACE/ADA: New Consensus Statement on Inpatient Glycemic Control (2015)  Target Ranges:  Prepandial:   less than 140 mg/dL      Peak postprandial:   less than 180 mg/dL (1-2 hours)      Critically ill patients:  140 - 180 mg/dL   Lab Results  Component Value Date   GLUCAP 263 (H) 12/24/2019   HGBA1C 5.9 (H) 12/11/2019    Review of Glycemic Control Results for Joseph Greer, Joseph Greer (MRN [id number]) as of 12/24/2019 11:19  Ref. Range 12/23/2019 20:07 12/23/2019 23:15 12/24/2019 04:07 12/24/2019 06:16 12/24/2019 08:08  Glucose-Capillary Latest Ref Range: 70 - 99 mg/dL 154 (H) 174 (H) 193 (H) 217 (H) 263 (H)   Diabetes history:  DM2 Outpatient Diabetes medications:  None Current orders for Inpatient glycemic control:  Novolog 0-24 units q4h  Solu-cortef 100 mg q8h Vital 1.5 @60ml /hr  Inpatient Diabetes Program Recommendations:     CBG's have elevated with tube feeds.  Please consider 2-3 units q4h tube feed coverage.  Stop if tube feeds are held or discontinued.  Will continue to follow while inpatient.  Thank you, Reche Dixon, RN, BSN Diabetes Coordinator Inpatient Diabetes Program [phone] (team pager from 8a-5p)

## 2019-12-24 NOTE — Consult Note (Signed)
NAME:  Joseph Greer, MRN:  [id number], DOB:  [birth year], LOS: 34 ADMISSION DATE:  12/11/2019, CONSULTATION DATE:  12/22/2019 REFERRING MD:  Orvan Seen, CHIEF COMPLAINT:  shock   HPI/course in hospital  64 year old man who was admitted for NSTEMI and was found to have severe biventricular failure and 3V CAD.  EF was 25%  Prior liver transplant at [facility] 2009.   7/16 - CABGx5 with Maze for Afib.   Post operative course complicated by persistent LV dysfunction and AKI.   CRRT initiated 7/19.   Past Medical History   Past Medical History:  Diagnosis Date  . Anxiety   . CKD (chronic kidney disease), stage III   . Depression   . Diabetes mellitus type 2, noninsulin dependent ([place])   . Hepatitis C   . Hypertension   . Psoriasis   . Status post liver transplant [facility])      Past Surgical History:  Procedure Laterality Date  . CLIPPING OF ATRIAL APPENDAGE N/A 12/19/2019   Procedure: CLIPPING OF ATRIAL APPENDAGE USING ATRICURE CLIP SIZE 50MM;  Surgeon: Wonda Olds, MD;  Location: [place];  Service: Open Heart Surgery;  Laterality: N/A;  . COLONOSCOPY    . COLONOSCOPY WITH PROPOFOL N/A 06/01/2017   Procedure: COLONOSCOPY WITH PROPOFOL;  Surgeon: Rogene Houston, MD;  [facility];  Service: Endoscopy;  Laterality: N/A;  7:30  . CORONARY ARTERY BYPASS GRAFT N/A 12/19/2019   Procedure: CORONARY ARTERY BYPASS GRAFTING (CABG) TIMES  X 5,   USING BILATERAL MAMMARY ARTERY AND RIGHT LEG GREATER SAPHENOUS VEIN HARVESTED ENDOSCOPICALLY;  Surgeon: Wonda Olds, MD;  Location: [place];  Service: Open Heart Surgery;  Laterality: N/A;  . HERNIA REPAIR Right   . LEFT HEART CATH AND CORONARY ANGIOGRAPHY N/A 12/12/2019   Procedure: LEFT HEART CATH AND CORONARY ANGIOGRAPHY;  Surgeon: Jettie Booze, MD;  Location: [place] CV LAB;  Service: Cardiovascular;  Laterality: N/A;  . LIVER BIOPSY    . LIVER SURGERY    . MAZE N/A 12/19/2019   Procedure: MAZE;  Surgeon: Wonda Olds,  MD;  Location: [place];  Service: Open Heart Surgery;  Laterality: N/A;  . POLYPECTOMY  06/01/2017   Procedure: POLYPECTOMY;  Surgeon: Rogene Houston, MD;  [facility];  Service: Endoscopy;;  polyp at sigmoid colon x2, ascending colon polyp, hepatic flexure polyp  . RIGHT HEART CATH N/A 12/17/2019   Procedure: RIGHT HEART CATH;  Surgeon: Larey Dresser, MD;  Location: [place] CV LAB;  Service: Cardiovascular;  Laterality: N/A;  . TEE WITHOUT CARDIOVERSION N/A 12/19/2019   Procedure: TRANSESOPHAGEAL ECHOCARDIOGRAM (TEE);  Surgeon: Wonda Olds, MD;  Location: [place];  Service: Open Heart Surgery;  Laterality: N/A;  . UPPER GASTROINTESTINAL ENDOSCOPY        Interim history/subjective:  7/21:POD#5. Epi stopped. On Milrinone .375.   7/20 :POD#4 from his CABG x 5 , MAZE, and left appendage clipping 7/16.Weaned off off of Levo and Vaso. Dobutamine stopped. On epinephrine and milrinone restarted this am. This morning patient is more alert. Continues to have a tremor. Tacro level is pending, last one was 7/15.     Objective   Blood pressure (!) 145/62, pulse 96, temperature (!) 97.2 F (36.2 C), temperature source Axillary, resp. rate (!) 23, height 5\' 10"  (1.778 m), weight 89.4 kg, SpO2 91 %. CVP:  [11 mmHg-32 mmHg] 11 mmHg      Intake/Output Summary (Last 24 hours) at 12/24/2019 [facility]  Last data filed at 12/24/2019 0700 Gross per 24 hour  Intake 2733.17 ml  Output 6234 ml  Net -3500.83 ml   Filed Weights   12/22/19 0500 12/23/19 0449 12/24/19 0453  Weight: 91.8 kg 93.5 kg 89.4 kg   Examinations: General: NAD, sitting up in chair HE: Normocephalic, atraumatic , EOMI, Conjunctivae normal ENT: trachea midline. Wearing nasal canula. R IJ CVC , L IJ HD cath Cardiovascular: Normal rate, regular rhythm.  No murmurs, rubs, or gallops Pulmonary : Effort normal, breath sounds normal. No wheezes, rales, or rhonchi Abdominal: soft, nontender,  bowel sounds  present Musculoskeletal: no swelling , deformity, injury ,or tenderness in extremities, Skin: Warm, dry , no bruising, erythema, or rash Psychiatric/Behavioral:  normal mood, normal behavior  Neuro: AOx3   , mentation improving   Coox is 64.5. Hgb 8.6 - stable Cr 4.07>3.24>3.01 on CRRT, Mg 2.6, K 4.8 Ancillary tests (personally reviewed)  CBC: Recent Labs  Lab 12/21/19 0231 12/21/19 0231 12/22/19 0341 12/22/19 1411 12/22/19 1530 12/23/19 0436 12/24/19 0433  WBC 18.0*  --  20.5* 16.4*  --  18.2* 13.9*  HGB 8.3*   < > 7.6* 6.6*  7.1* 6.7* 8.7* 8.6*  HCT 27.1*   < > 24.1* 21.5*  21.0* 21.9* 27.6* 27.4*  MCV 86.6  --  85.2 84.3  --  84.1 83.8  PLT 133*  --  163 158  --  156 156   < > = values in this interval not displayed.    Basic Metabolic Panel: Recent Labs  Lab 12/20/19 1825 12/21/19 0231 12/22/19 0341 12/22/19 0341 12/22/19 1155 12/22/19 1411 12/22/19 1539 12/23/19 0436 12/23/19 1544 12/24/19 0433  NA 136   < > 131*   < > 132* 132*  --  135 134* 135  K 5.8*   < > 4.8   < > 4.9 4.6  --  5.2* 4.8 4.8  CL 104   < > 98  --  99  --   --  101 99 101  CO2 21*   < > 19*  --  17*  --   --  21* 24 24  GLUCOSE 199*   < > 134*  --  128*  --   --  162* 240* 235*  BUN 31*   < > 51*  --  55*  --   --  34* 29* 30*  CREATININE 3.57*   < > 5.85*  --  6.46*  --   --  4.07* 3.24* 3.01*  CALCIUM 7.7*   < > 7.6*  --  7.9*  --   --  8.0* 7.8* 8.0*  MG 2.6*  --   --   --   --   --  2.2 2.4 2.6* 2.6*  PHOS  --   --   --   --  6.9*  --  7.3* 5.1* 3.8 2.9   < > = values in this interval not displayed.   GFR: Estimated Creatinine Clearance: 28.6 mL/min (A) (by C-G formula based on SCr of 3.01 mg/dL (H)). Recent Labs  Lab 12/22/19 0341 12/22/19 1411 12/22/19 1548 12/22/19 1845 12/23/19 0436 12/24/19 0433  WBC 20.5* 16.4*  --   --  18.2* 13.9*  LATICACIDVEN  --   --  4.9* 2.8*  --   --     Liver Function Tests: Recent Labs  Lab 12/18/19 0500 12/18/19 0500 12/22/19 1155  12/22/19 1530 12/23/19 0436 12/23/19 1544 12/24/19 0433  AST 26  --   --  31  --   --   --   ALT 32  --   --  20  --   --   --   ALKPHOS 90  --   --  66  --   --   --   BILITOT 0.8  --   --  0.9  --   --   --   PROT 6.5  --   --  5.4*  --   --   --   ALBUMIN 3.1*   < > 2.4* 2.3* 2.6* 2.4* 2.6*   < > = values in this interval not displayed.   No results for input(s): LIPASE, AMYLASE in the last 168 hours. No results for input(s): AMMONIA in the last 168 hours.  ABG    Component Value Date/Time   PHART 7.241 (L) 12/22/2019 1411   PCO2ART 42.6 12/22/2019 1411   PO2ART 42 (L) 12/22/2019 1411   HCO3 18.3 (L) 12/22/2019 1411   TCO2 20 (L) 12/22/2019 1411   ACIDBASEDEF 8.0 (H) 12/22/2019 1411   O2SAT 64.5 12/24/2019 0529     Coagulation Profile: Recent Labs  Lab 12/19/19 1500  INR 1.4*    Cardiac Enzymes: No results for input(s): CKTOTAL, CKMB, CKMBINDEX, TROPONINI in the last 168 hours.  HbA1C: Hgb A1c MFr Bld  Date/Time Value Ref Range Status  12/11/2019 09:40 PM 5.9 (H) 4.8 - 5.6 % Final    Comment:    (NOTE) Pre diabetes:          5.7%-6.4%  Diabetes:              >6.4%  Glycemic control for   <7.0% adults with diabetes   03/26/2013 10:30 PM 5.8 (H) <5.7 % Final    Comment:    (NOTE)                                                                       According to the ADA Clinical Practice Recommendations for 2011, when HbA1c is used as a screening test:  >=6.5%   Diagnostic of Diabetes Mellitus           (if abnormal result is confirmed) 5.7-6.4%   Increased risk of developing Diabetes Mellitus References:Diagnosis and Classification of Diabetes Mellitus,Diabetes KGYJ,8563,14(HFWYO 1):S62-S69 and Standards of Medical Care in         Diabetes - 2011,Diabetes Care,2011,34 (Suppl 1):S11-S61.    CBG: Recent Labs  Lab 12/23/19 1548 12/23/19 2007 12/23/19 2315 12/24/19 0407 12/24/19 0616  GLUCAP 230* 154* 174* 193* 217*    Assessment & Plan:    CAD with ischemic cardiomyopathy S/P CABGx5.  Co-ox 48% yesterday on Milrinone .25, much improved this morning on milrinone .375. Still on Epi 1 mcg .  Plan: - HF Cardiology consulting - Continue ASA and atorvastatin - Milirinone .375  - Continuing CRRT with goal UF 150 cc/hr - Stop empiric antibiotics, Vanc/Cefepime   Acute hypoxic respiratory insufficiency.  Improving with CRRT, 5L down to 3.5L nasal canula overnight.   Plan: Wean supplemental oxygen as tolerated   Atrial Fibrillation s/p MAZE Sinus rhythm today. On IV Amio and in NSR.  Plan: - Stop Amio gtt - Start PO amio 200 daily - Anticogatulation per TCTS  AKI  requiring CRRT CKD stage III Cr improving with CRRT, 4.07 > 3.01  CRRT as above Plan: - Daily RFP  Status post liver transplant - Was concern for Tacrolimus toxicity in setting of worsened kidney function , shakes, and ams. Tacrolimus  6.6 on 7/18 , appears to be drawn as trough ( drawn ~1 hour before next dose). On chart review with pharmacy patients goal is 2-3. Management has been dificult due to lab turnaround on lab. HF PharmD reaching out to colleague with transplant pharmacy to discuss dosing. Patient is on stress dose steroids while following up Tacro level and dosing. Plan to wean once back on Tacro. Plan: - Hold Tacro - Continue Hydrocortisone - Follow up Tacro level drawn on 19th  - Follow up recommendations on Tacro dosing   Steroid induced hyperglycemia Patient's blood glucose trending up with steroids. Only slightly above hospital goal, will continue to monitor on SSI - equivalent to steroid dosing. Plan: - No Basal regimen at this time - SSI for bolus - monitor CBG  Daily Goals Checklist  Pain/Anxiety/Delirium protocol (if indicated): minimize VAP protocol (if indicated): not intubated.  Respiratory support goals: nasal cannulae Blood pressure target: titrate for MAP>65 DVT prophylaxis: per CTS Nutritional status and feeding goals:  advanced to [facility]  GI prophylaxis: not indicated.  Fluid status goals: CRRT - 179ml/hr UF Urinary catheter: no catheter Central lines: right introducer, left HD cathe Glucose control: euglycemic Mobility/therapy needs: OOB to chair Antibiotic de-escalation: stop cefepime and vancomycin Daily labs:RFP, MG Code Status: full Family Communication: per cardiac surgery Disposition: ICU  Tamsen Snider, MD PGY2

## 2019-12-24 NOTE — Progress Notes (Signed)
Patient ID: Joseph Greer, male   DOB: 08/25/1955, 64 y.o.   MRN: [id number]     Advanced Heart Failure Rounding Note  PCP-Cardiologist: Candee Furbish, MD  AHF:  Dr. [name]   Patient Profile   64 y/o male w/ h/o Hep C s/p liver transplant in 2009 (followed at [facility]), Stage III CKD, HTN and T2DM admitted w/ NSTEMI and Afib w/ RVR, found to have severe 3VD and biventricular heart failure and ? PFO, LVEF 20-25%, RV moderately reduced. S/p CABG + MAZE + LA appendage clipping 7/16.    Subjective:    - 7/16: CABG x 5 with LIMA-LAD, RIMA-ramus, seq SVG-PD/PL, SVG-D; Maze; LA appendage clipping. - 7/18 Limited echo: LVEF 35-40%. RV normal. No pericardial effusion.  - Post operative course c/b AKI and oliguria. CRRT initiated 7/19.  - 7/19 Milrinone discontinued>>changed to dobutamine. Developed hypotension w/ increase in pressor support. NE and VP added. Empiric abx added for ?HCAP.  Became increasingly tachycardic and dobutamine stopped overnight.   More stable today, co-ox 65% with CVP down to 14.  CVVH pulling 150 cc/hr, weight down 9 lbs.  Still with tremor but alert and oriented.   AF. WBC trend 16>>18>>21>>16>>18>>13.4. CXR no PNA.   He is off tacrolimus with confusion/tremor and some concern for toxicity, though 7/18 level was 6.6 (within range for trough but based on timing may not have actually been a trough).  He remains on stress dose steroids.    RHC Procedural Findings (pre-op): Hemodynamics (mmHg) RA mean 8 RV 55/11 PA 60/30, mean 44 PCWP mean 36 Oxygen saturations: PA 62% AO 97% Cardiac Output (Fick) 5.3  Cardiac Index (Fick) 2.61 Cardiac Output (Thermo) 5.11 Cardiac Index (Thermo) 2.52  PVR 1.6 WU  Objective:   Weight Range: 89.4 kg Body mass index is 28.28 kg/m.   Vital Signs:   Temp:  [97.2 F (36.2 C)-98.3 F (36.8 C)] 97.3 F (36.3 C) (07/21 0805) Pulse Rate:  [80-99] 96 (07/21 0703) Resp:  [16-24] 23 (07/21 0703) BP: (97-161)/(50-98) 145/62 (07/21  0703) SpO2:  [91 %-98 %] 91 % (07/21 0703) Weight:  [89.4 kg] 89.4 kg (07/21 0453) Last BM Date: 12/24/19  Weight change: Filed Weights   12/22/19 0500 12/23/19 0449 12/24/19 0453  Weight: 91.8 kg 93.5 kg 89.4 kg    Intake/Output:   Intake/Output Summary (Last 24 hours) at 12/24/2019 0836 Last data filed at 12/24/2019 0700 Gross per 24 hour  Intake 2533.07 ml  Output 6113 ml  Net -3579.93 ml      Physical Exam   CVP 20 General: fatigue appearing/ diaphoretic. no respiratory difficulty  Neck: JVP elevated to ear, no thyromegaly or thyroid nodule. +Rt IJ CVC + Lt IJ HD cath Lungs: normal respiratory effort. Decreased BS at the bases  CV: Nondisplaced PMI.  Heart regular S1/S2, no S3/S4, no murmur.  + CT, No LEE Abdomen: Soft, nontender, no hepatosplenomegaly, no distention.  Skin: Intact without lesions or rashes.  Neurologic: Alert and oriented x 3.  Psych: pleasant, Normal affect. Extremities: No clubbing or cyanosis.  HEENT: Normal. + Cor trak    Telemetry   NSR 80s (personally reviewed)  EKG    No new EKG to review today.   Labs    CBC Recent Labs    12/23/19 0436 12/24/19 0433  WBC 18.2* 13.9*  HGB 8.7* 8.6*  HCT 27.6* 27.4*  MCV 84.1 83.8  PLT 156 950   Basic Metabolic Panel Recent Labs    12/23/19 1544 12/24/19 0433  NA 134* 135  K 4.8 4.8  CL 99 101  CO2 24 24  GLUCOSE 240* 235*  BUN 29* 30*  CREATININE 3.24* 3.01*  CALCIUM 7.8* 8.0*  MG 2.6* 2.6*  PHOS 3.8 2.9   Liver Function Tests Recent Labs    12/22/19 1530 12/23/19 0436 12/23/19 1544 12/24/19 0433  AST 31  --   --   --   ALT 20  --   --   --   ALKPHOS 66  --   --   --   BILITOT 0.9  --   --   --   PROT 5.4*  --   --   --   ALBUMIN 2.3*   < > 2.4* 2.6*   < > = values in this interval not displayed.   No results for input(s): LIPASE, AMYLASE in the last 72 hours. Cardiac Enzymes No results for input(s): CKTOTAL, CKMB, CKMBINDEX, TROPONINI in the last 72  hours.  BNP: BNP (last 3 results) No results for input(s): BNP in the last 8760 hours.  ProBNP (last 3 results) No results for input(s): PROBNP in the last 8760 hours.   D-Dimer No results for input(s): DDIMER in the last 72 hours. Hemoglobin A1C No results for input(s): HGBA1C in the last 72 hours. Fasting Lipid Panel No results for input(s): CHOL, HDL, LDLCALC, TRIG, CHOLHDL, LDLDIRECT in the last 72 hours. Thyroid Function Tests No results for input(s): TSH, T4TOTAL, T3FREE, THYROIDAB in the last 72 hours.  Invalid input(s): FREET3  Other results:   Imaging    DG Chest 1 View  Result Date: 12/24/2019 CLINICAL DATA:  Status post cardiac surgery. EXAM: CHEST  1 VIEW COMPARISON:  December 23, 2019. FINDINGS: Feeding tube is unchanged in position. Bilateral internal jugular catheters are unchanged. Right-sided PICC line is unchanged. Bilateral chest tubes are noted without pneumothorax. Right lung is clear. Stable elevated left hemidiaphragm is noted with mild left basilar atelectasis. Bony thorax is unremarkable. IMPRESSION: Stable support apparatus. No pneumothorax is noted. Stable elevated left hemidiaphragm with mild left basilar atelectasis. Electronically Signed   By: Marijo Conception M.D.   On: 12/24/2019 08:24     Medications:     Scheduled Medications: . Apremilast  1 tablet Oral Daily  . aspirin EC  81 mg Oral Daily  . atorvastatin  80 mg Oral Daily  . B-complex with vitamin C  1 tablet Per Tube Daily  . bisacodyl  10 mg Oral Daily   Or  . bisacodyl  10 mg Rectal Daily  . chlorhexidine gluconate (MEDLINE KIT)  15 mL Mouth Rinse BID  . Chlorhexidine Gluconate Cloth  6 each Topical Daily  . clopidogrel  75 mg Oral Daily  . docusate sodium  200 mg Oral Daily  . enoxaparin (LOVENOX) injection  30 mg Subcutaneous QHS  . feeding supplement (PROSource TF)  45 mL Per Tube BID  . hydrocortisone sod succinate (SOLU-CORTEF) inj  100 mg Intravenous Q8H  . insulin aspart   0-24 Units Subcutaneous Q4H  . pantoprazole  40 mg Oral Daily  . sodium chloride flush  3 mL Intravenous Q12H  . venlafaxine XR  37.5 mg Oral Daily    Infusions: .  prismasol BGK 4/2.5 400 mL/hr at 12/24/19 0453  . sodium chloride Stopped (12/21/19 1602)  . amiodarone 30 mg/hr (12/24/19 0700)  . ceFEPime (MAXIPIME) IV Stopped (12/23/19 2259)  . epinephrine 1 mcg/min (12/24/19 0700)  . feeding supplement (VITAL 1.5 CAL) 60 mL/hr at 12/24/19  0300  . lactated ringers Stopped (12/19/19 1448)  . lactated ringers Stopped (12/20/19 1152)  . milrinone 0.375 mcg/kg/min (12/24/19 0718)  . norepinephrine (LEVOPHED) Adult infusion Stopped (12/23/19 0248)  . prismasol BGK 2/2.5 replacement solution 300 mL/hr at 12/24/19 0343  . prismasol BGK 4/2.5 1,800 mL/hr at 12/24/19 6063  . vancomycin Stopped (12/23/19 1301)  . vasopressin Stopped (12/23/19 0702)    PRN Medications: heparin, LORazepam, metoprolol tartrate, ondansetron (ZOFRAN) IV, oxyCODONE, senna-docusate, sodium chloride flush    Assessment/Plan   1. CAD: Severe 3VD on cath 7/21 => 60-70% ostial LAD, 80% mLAD, 95% ramus, 99% proximal LCx with TIMI-2 flow down the rest of the LCx, occluded distal RCA.  Suspect out of hospital inferolateral MI.  Cardiac MRI showed that the inferolateral wall is likely not viable, but the remainder of the LV myocardium is likely viable. He had CABG 7/16 with LIMA-LAD, RIMA-ramus, seq SVG-PD/PL, SVG-D.  - Continue ASA + atorvastatin.   2. Acute on chronic systolic CHF: Echo with EF 20-25%, moderately decreased RV systolic function. Cardiac MRI with LV EF 26%, RV EF 38%, near full thickness scar in inferolateral wall suggesting lack of viability in the LCx territory.  Now post-CABG. Repeat limited echo 7/20 showed LVEF 20-25%, RV normal, no pericardial effusion. CVP 14 today (decreased), weight down on CVVH.  Co-ox 64.5% today. MAP stable.  - Stop epinephrine today, continue milrinone 0.375.  - Continue CVVH  with UF 150 cc/hr today with ongoing volume overload.   3. Atrial fibrillation: Patient admitted with afib/RVR, now in NSR. S/p Maze + LAA clipping on 7/16.  - Continue amiodarone gtt for now.  - Resume full anticoagulation when stable per TCTS. Currently getting DVT prophylaxis enoxaparin.  4. AKI on CKD stage 3: Creatine has been stable 1.9-2.3 range in hospital pre-op.  Was 1.3 back in 4/21. Creatinine rose to 6.46 post-op. Anuric. Suspect post-op ATN. CRRT initiated 7/19.  - Continue CRRT, still with volume overload and removing fluid with net UF 150 cc/hr.  5. Type 2 diabetes: SSI.  6. Liver transplant for HCV: tacrolimus currently on hold (? Toxicity, concomitate use w/ amiodarone can increase levels). On 7/18, level was 6.6 (not sure this was true trough) and within goal range for trough.  - Pending repeat trough, discussed with pharmacy and will reach out to his transplant team to come up with a dose to restart.  - Currently on stress dose steroids (not on steroids at home), will begin to taper down per CCM.  7. ID: 7/19 developed hypotension w/ increasing pressor support, increasing leukocytosis and CXR w/ hazy opacities. Started on empiric abx for ? HCAP with vancomycin/cefepime. BC NGTD. WBCs decreasing.  - Discuss with CCM, ?5 day course abx.  8. Neuro: Alert/oriented today, improved.   Mobilize with PT.   CRITICAL CARE Performed by: Loralie Champagne  Total critical care time: 35 minutes  Critical care time was exclusive of separately billable procedures and treating other patients.  Critical care was necessary to treat or prevent imminent or life-threatening deterioration.  Critical care was time spent personally by me on the following activities: development of treatment plan with patient and/or surrogate as well as nursing, discussions with consultants, evaluation of patient's response to treatment, examination of patient, obtaining history from patient or surrogate, ordering and  performing treatments and interventions, ordering and review of laboratory studies, ordering and review of radiographic studies, pulse oximetry and re-evaluation of patient's condition.  Loralie Champagne 12/24/2019 8:36 AM

## 2019-12-25 DIAGNOSIS — R570 Cardiogenic shock: Secondary | ICD-10-CM | POA: Diagnosis not present

## 2019-12-25 DIAGNOSIS — N179 Acute kidney failure, unspecified: Secondary | ICD-10-CM | POA: Diagnosis not present

## 2019-12-25 LAB — RENAL FUNCTION PANEL
Albumin: 2.5 g/dL — ABNORMAL LOW (ref 3.5–5.0)
Albumin: 2.4 g/dL — ABNORMAL LOW (ref 3.5–5.0)
Anion gap: 10 (ref 5–15)
Anion gap: 9 (ref 5–15)
BUN: 27 mg/dL — ABNORMAL HIGH (ref 8–23)
BUN: 27 mg/dL — ABNORMAL HIGH (ref 8–23)
CO2: 24 mmol/L (ref 22–32)
CO2: 25 mmol/L (ref 22–32)
Calcium: 7.9 mg/dL — ABNORMAL LOW (ref 8.9–10.3)
Calcium: 7.6 mg/dL — ABNORMAL LOW (ref 8.9–10.3)
Chloride: 99 mmol/L (ref 98–111)
Chloride: 99 mmol/L (ref 98–111)
Creatinine, Ser: 2.54 mg/dL — ABNORMAL HIGH (ref 0.61–1.24)
Creatinine, Ser: 2.48 mg/dL — ABNORMAL HIGH (ref 0.61–1.24)
GFR calc Af Amer: 30 mL/min — ABNORMAL LOW (ref >60)
GFR calc Af Amer: 31 mL/min — ABNORMAL LOW (ref >60)
GFR calc non Af Amer: 26 mL/min — ABNORMAL LOW (ref >60)
GFR calc non Af Amer: 27 mL/min — ABNORMAL LOW (ref >60)
Glucose, Bld: 223 mg/dL — ABNORMAL HIGH (ref 70–99)
Glucose, Bld: 220 mg/dL — ABNORMAL HIGH (ref 70–99)
Phosphorus: 1.5 mg/dL — ABNORMAL LOW (ref 2.5–4.6)
Phosphorus: 1.2 mg/dL — ABNORMAL LOW (ref 2.5–4.6)
Potassium: 4.4 mmol/L (ref 3.5–5.1)
Potassium: 4 mmol/L (ref 3.5–5.1)
Sodium: 133 mmol/L — ABNORMAL LOW (ref 135–145)
Sodium: 133 mmol/L — ABNORMAL LOW (ref 135–145)

## 2019-12-25 LAB — CBC
HCT: 28.9 % — ABNORMAL LOW (ref 39.0–52.0)
Hemoglobin: 9.1 g/dL — ABNORMAL LOW (ref 13.0–17.0)
MCH: 26.5 pg (ref 26.0–34.0)
MCHC: 31.5 g/dL (ref 30.0–36.0)
MCV: 84 fL (ref 80.0–100.0)
Platelets: 168 10*3/uL (ref 150–400)
RBC: 3.44 MIL/uL — ABNORMAL LOW (ref 4.22–5.81)
RDW: 16.1 % — ABNORMAL HIGH (ref 11.5–15.5)
WBC: 11.3 10*3/uL — ABNORMAL HIGH (ref 4.0–10.5)
nRBC: 4.6 % — ABNORMAL HIGH (ref 0.0–0.2)

## 2019-12-25 LAB — GLUCOSE, CAPILLARY
Glucose-Capillary: 148 mg/dL — ABNORMAL HIGH (ref 70–99)
Glucose-Capillary: 161 mg/dL — ABNORMAL HIGH (ref 70–99)
Glucose-Capillary: 195 mg/dL — ABNORMAL HIGH (ref 70–99)
Glucose-Capillary: 206 mg/dL — ABNORMAL HIGH (ref 70–99)
Glucose-Capillary: 236 mg/dL — ABNORMAL HIGH (ref 70–99)
Glucose-Capillary: 239 mg/dL — ABNORMAL HIGH (ref 70–99)

## 2019-12-25 LAB — MAGNESIUM: Magnesium: 2.6 mg/dL — ABNORMAL HIGH (ref 1.7–2.4)

## 2019-12-25 LAB — COOXEMETRY PANEL
Carboxyhemoglobin: 1.5 % (ref 0.5–1.5)
Carboxyhemoglobin: 1.7 % — ABNORMAL HIGH (ref 0.5–1.5)
Methemoglobin: 1.1 % (ref 0.0–1.5)
Methemoglobin: 1.1 % (ref 0.0–1.5)
O2 Saturation: 58.1 %
O2 Saturation: 58 %
Total hemoglobin: 9.5 g/dL — ABNORMAL LOW (ref 12.0–16.0)
Total hemoglobin: 9.3 g/dL — ABNORMAL LOW (ref 12.0–16.0)

## 2019-12-25 MED ORDER — SALINE SPRAY 0.65 % NA SOLN
2.0000 | NASAL | Status: DC | PRN
  Filled 2019-12-25: qty 44

## 2019-12-25 MED ORDER — MILRINONE LACTATE IN DEXTROSE 20-5 MG/100ML-% IV SOLN
0.1250 ug/kg/min | INTRAVENOUS | Status: DC
  Administered 2019-12-25 – 2019-12-26 (×3): 0.25 ug/kg/min via INTRAVENOUS
  Filled 2019-12-25 (×2): qty 100

## 2019-12-25 MED ORDER — DEXMEDETOMIDINE HCL IN NACL 400 MCG/100ML IV SOLN
0.1000 ug/kg/h | INTRAVENOUS | Status: DC
  Administered 2019-12-25: 1.2 ug/kg/h via INTRAVENOUS
  Administered 2019-12-26: 0.1 ug/kg/h via INTRAVENOUS
  Filled 2019-12-25 (×2): qty 100

## 2019-12-25 MED ORDER — APIXABAN 5 MG PO TABS
5.0000 mg | ORAL_TABLET | Freq: Two times a day (BID) | ORAL | Status: DC
  Administered 2019-12-25 – 2020-01-01 (×15): 5 mg via ORAL
  Filled 2019-12-25 (×15): qty 1

## 2019-12-25 MED ORDER — GERHARDT'S BUTT CREAM
TOPICAL_CREAM | CUTANEOUS | Status: DC | PRN
  Filled 2019-12-25: qty 1

## 2019-12-25 MED FILL — Sodium Chloride IV Soln 0.9%: INTRAVENOUS | Qty: 2000 | Status: AC

## 2019-12-25 MED FILL — Sodium Bicarbonate IV Soln 8.4%: INTRAVENOUS | Qty: 50 | Status: AC

## 2019-12-25 MED FILL — Heparin Sodium (Porcine) Inj 1000 Unit/ML: INTRAMUSCULAR | Qty: 10 | Status: AC

## 2019-12-25 MED FILL — Mannitol IV Soln 20%: INTRAVENOUS | Qty: 500 | Status: AC

## 2019-12-25 MED FILL — Electrolyte-R (PH 7.4) Solution: INTRAVENOUS | Qty: 4000 | Status: AC

## 2019-12-25 MED FILL — Calcium Chloride Inj 10%: INTRAVENOUS | Qty: 10 | Status: AC

## 2019-12-25 NOTE — Progress Notes (Signed)
OT Cancellation Note  Patient Details Name: Joseph Greer MRN: [id number] DOB: Apr 02, 1956   Cancelled Treatment:    Reason Eval/Treat Not Completed: Other (comment). Pt hallucinating. Just given Ativan and sleeping. Will attempt later as schedule allows.   Ramond Dial, OT/L   Acute OT Clinical Specialist Acute Rehabilitation Services Pager [phone] Office [phone]  12/25/2019, 10:33 AM

## 2019-12-25 NOTE — Progress Notes (Signed)
eLink Physician-Brief Progress Note Patient Name: Joseph Greer DOB: 06-07-1955 MRN: [id number]   Date of Service  12/25/2019  HPI/Events of Note  Nasal congestion   eICU Interventions  Plan: 1. Ocean spray NaCl solution 2 sprays to each nostril PRN.     Intervention Category Major Interventions: Other:  Lysle Dingwall 12/25/2019, 3:38 AM

## 2019-12-25 NOTE — Consult Note (Signed)
NAME:  Joseph Greer, MRN:  [id number], DOB:  1955/07/24, LOS: 59 ADMISSION DATE:  12/11/2019, CONSULTATION DATE:  12/22/2019 REFERRING MD:  Orvan Seen, CHIEF COMPLAINT:  shock   HPI/course in hospital  64 year old man who was admitted for NSTEMI and was found to have severe biventricular failure and 3V CAD.  EF was 25%  Prior liver transplant at [facility] 2009.   7/16 - CABGx5 with Maze for Afib.   Post operative course complicated by persistent LV dysfunction and AKI.   CRRT initiated 7/19.   Past Medical History   Past Medical History:  Diagnosis Date  . Anxiety   . CKD (chronic kidney disease), stage III   . Depression   . Diabetes mellitus type 2, noninsulin dependent ([place])   . Hepatitis C   . Hypertension   . Psoriasis   . Status post liver transplant [facility])      Past Surgical History:  Procedure Laterality Date  . CLIPPING OF ATRIAL APPENDAGE N/A 12/19/2019   Procedure: CLIPPING OF ATRIAL APPENDAGE USING ATRICURE CLIP SIZE 50MM;  Surgeon: Wonda Olds, MD;  Location: [place];  Service: Open Heart Surgery;  Laterality: N/A;  . COLONOSCOPY    . COLONOSCOPY WITH PROPOFOL N/A 06/01/2017   Procedure: COLONOSCOPY WITH PROPOFOL;  Surgeon: Rogene Houston, MD;  [facility];  Service: Endoscopy;  Laterality: N/A;  7:30  . CORONARY ARTERY BYPASS GRAFT N/A 12/19/2019   Procedure: CORONARY ARTERY BYPASS GRAFTING (CABG) TIMES  X 5,   USING BILATERAL MAMMARY ARTERY AND RIGHT LEG GREATER SAPHENOUS VEIN HARVESTED ENDOSCOPICALLY;  Surgeon: Wonda Olds, MD;  Location: [place];  Service: Open Heart Surgery;  Laterality: N/A;  . HERNIA REPAIR Right   . LEFT HEART CATH AND CORONARY ANGIOGRAPHY N/A 12/12/2019   Procedure: LEFT HEART CATH AND CORONARY ANGIOGRAPHY;  Surgeon: Jettie Booze, MD;  Location: [place] CV LAB;  Service: Cardiovascular;  Laterality: N/A;  . LIVER BIOPSY    . LIVER SURGERY    . MAZE N/A 12/19/2019   Procedure: MAZE;  Surgeon: Wonda Olds,  MD;  Location: [place];  Service: Open Heart Surgery;  Laterality: N/A;  . POLYPECTOMY  06/01/2017   Procedure: POLYPECTOMY;  Surgeon: Rogene Houston, MD;  [facility];  Service: Endoscopy;;  polyp at sigmoid colon x2, ascending colon polyp, hepatic flexure polyp  . RIGHT HEART CATH N/A 12/17/2019   Procedure: RIGHT HEART CATH;  Surgeon: Larey Dresser, MD;  Location: [place] CV LAB;  Service: Cardiovascular;  Laterality: N/A;  . TEE WITHOUT CARDIOVERSION N/A 12/19/2019   Procedure: TRANSESOPHAGEAL ECHOCARDIOGRAM (TEE);  Surgeon: Wonda Olds, MD;  Location: [place];  Service: Open Heart Surgery;  Laterality: N/A;  . UPPER GASTROINTESTINAL ENDOSCOPY        Interim history/subjective:  7/22: POD #6 Alert and oriented on exam. Report of some hallucinations overnight by RN. Off of supplemental oxygen and milrinone dose reduced. CTS starting Eliquis today. Continuing Tacrolimus at half dose while awaiting levels.   7/21:POD#5. Epi stopped. On Milrinone .375.   7/20 :POD#4 from his CABG x 5 , MAZE, and left appendage clipping 7/16.Weaned off off of Levo and Vaso. Dobutamine stopped. On epinephrine and milrinone restarted this am. This morning patient is more alert. Continues to have a tremor. Tacro level is pending, last one was 7/15.     Objective   Blood pressure 120/67, pulse 93, temperature 98.4 F (36.9 C), temperature source Oral,  resp. rate (!) 21, height 5\' 10"  (1.778 m), weight 84.1 kg, SpO2 97 %. CVP:  [6 mmHg-25 mmHg] 6 mmHg      Intake/Output Summary (Last 24 hours) at 12/25/2019 0839 Last data filed at 12/25/2019 0800 Gross per 24 hour  Intake 2300.28 ml  Output 6304 ml  Net -4003.72 ml   Filed Weights   12/23/19 0449 12/24/19 0453 12/25/19 0500  Weight: 93.5 kg 89.4 kg 84.1 kg   Examinations: General: NAD, sitting up in bed HE: Normocephalic, atraumatic , EOMI, Conjunctivae normal ENT: trachea midline. No exudate or rhinorrhea  Cardiovascular: Normal  rate, regular rhythm.  No murmurs, rubs, or gallops Pulmonary : Effort normal, breath sounds normal. Coarse crackles on left base Abdominal: soft, nontender,  bowel sounds present Musculoskeletal: no swelling , deformity, injury ,or tenderness in extremities, Skin: Warm, dry , no bruising, erythema, or rash Psychiatric/Behavioral: anxious mood, hallucinations reported to RN  Neuro: AOx3, no gross focal deficits    Coox is 64.5. Hgb 8.6 - stable Cr 4.07>3.24>3.01 on CRRT, Mg 2.6, K 4.8 Ancillary tests (personally reviewed)  CBC: Recent Labs  Lab 12/22/19 0341 12/22/19 0341 12/22/19 1411 12/22/19 1530 12/23/19 0436 12/24/19 0433 12/25/19 0429  WBC 20.5*  --  16.4*  --  18.2* 13.9* 11.3*  HGB 7.6*   < > 6.6*  7.1* 6.7* 8.7* 8.6* 9.1*  HCT 24.1*   < > 21.5*  21.0* 21.9* 27.6* 27.4* 28.9*  MCV 85.2  --  84.3  --  84.1 83.8 84.0  PLT 163  --  158  --  156 156 168   < > = values in this interval not displayed.    Basic Metabolic Panel: Recent Labs  Lab 12/22/19 1155 12/22/19 1539 12/23/19 0436 12/23/19 1544 12/24/19 0433 12/24/19 1627 12/25/19 0429  NA   < >  --  135 134* 135 136 133*  K   < >  --  5.2* 4.8 4.8 4.4 4.4  CL   < >  --  101 99 101 100 99  CO2   < >  --  21* 24 24 27 24   GLUCOSE   < >  --  162* 240* 235* 190* 223*  BUN   < >  --  34* 29* 30* 30* 27*  CREATININE   < >  --  4.07* 3.24* 3.01* 2.61* 2.54*  CALCIUM   < >  --  8.0* 7.8* 8.0* 7.9* 7.9*  MG  --  2.2 2.4 2.6* 2.6*  --  2.6*  PHOS   < > 7.3* 5.1* 3.8 2.9 2.2* 1.5*   < > = values in this interval not displayed.   GFR: Estimated Creatinine Clearance: 31.1 mL/min (A) (by C-G formula based on SCr of 2.54 mg/dL (H)). Recent Labs  Lab 12/22/19 1411 12/22/19 1548 12/22/19 1845 12/23/19 0436 12/24/19 0433 12/25/19 0429  WBC 16.4*  --   --  18.2* 13.9* 11.3*  LATICACIDVEN  --  4.9* 2.8*  --   --   --     Liver Function Tests: Recent Labs  Lab 12/22/19 1530 12/22/19 1530 12/23/19 0436  12/23/19 1544 12/24/19 0433 12/24/19 1627 12/25/19 0429  AST 31  --   --   --   --   --   --   ALT 20  --   --   --   --   --   --   ALKPHOS 66  --   --   --   --   --   --  BILITOT 0.9  --   --   --   --   --   --   PROT 5.4*  --   --   --   --   --   --   ALBUMIN 2.3*   < > 2.6* 2.4* 2.6* 2.5* 2.5*   < > = values in this interval not displayed.   No results for input(s): LIPASE, AMYLASE in the last 168 hours. No results for input(s): AMMONIA in the last 168 hours.  ABG    Component Value Date/Time   PHART 7.241 (L) 12/22/2019 1411   PCO2ART 42.6 12/22/2019 1411   PO2ART 42 (L) 12/22/2019 1411   HCO3 18.3 (L) 12/22/2019 1411   TCO2 20 (L) 12/22/2019 1411   ACIDBASEDEF 8.0 (H) 12/22/2019 1411   O2SAT 58.1 12/25/2019 0429     Coagulation Profile: Recent Labs  Lab 12/19/19 1500  INR 1.4*    Cardiac Enzymes: No results for input(s): CKTOTAL, CKMB, CKMBINDEX, TROPONINI in the last 168 hours.  HbA1C: Hgb A1c MFr Bld  Date/Time Value Ref Range Status  12/11/2019 09:40 PM 5.9 (H) 4.8 - 5.6 % Final    Comment:    (NOTE) Pre diabetes:          5.7%-6.4%  Diabetes:              >6.4%  Glycemic control for   <7.0% adults with diabetes   03/26/2013 10:30 PM 5.8 (H) <5.7 % Final    Comment:    (NOTE)                                                                       According to the ADA Clinical Practice Recommendations for 2011, when HbA1c is used as a screening test:  >=6.5%   Diagnostic of Diabetes Mellitus           (if abnormal result is confirmed) 5.7-6.4%   Increased risk of developing Diabetes Mellitus References:Diagnosis and Classification of Diabetes Mellitus,Diabetes PIRJ,1884,16(SAYTK 1):S62-S69 and Standards of Medical Care in         Diabetes - 2011,Diabetes Care,2011,34 (Suppl 1):S11-S61.    CBG: Recent Labs  Lab 12/24/19 1526 12/24/19 2013 12/25/19 0012 12/25/19 0428 12/25/19 0659  GLUCAP 77 313* 148* 236* 161*    Assessment & Plan:    CAD with ischemic cardiomyopathy S/P CABGx5.  Co-ox 56% today on Milrinone .375, much improved and HF reduced milrinone to  .25. CVP 7 this am.  Plan: - HF Cardiology consulting - Continue ASA and atorvastatin - Milirinone .25  - Continuing CRRT ,decreased rate to 50cc/hr  Acute hypoxic respiratory failure ( resolved , weaned off supplemental oxygen 7/22)  Atrial Fibrillation s/p MAZE Sinus rhythm today. On IV Amio and in NSR.  Plan: - Continue PO amio 200 daily - Starting Eliquis   AKI requiring CRRT CKD stage III  CRRT as above Plan: - Daily RFP  Status post liver transplant - Tacrolimus levels are not back.Restarted on half home dose. HF Pharmacy coordinating with transplant center.  Plan: - Tacro 1mg  BID - Stop hydrocortisone after 3 dose of 50 mg  - Follow up Tacro level drawn on 19th  - Follow up recommendations on Tacro dosing  Steroid induced hyperglycemia Patient's blood glucose trending up with steroids. Better controlled with tube feed coverage. Continue to monitor and adjust as needed.  Plan: - No Basal regimen at this time - 3 units with tube feeds. - SSI for bolus - monitor CBG  Daily Goals Checklist  Pain/Anxiety/Delirium protocol (if indicated):Ativan PRN  DVT prophylaxis: apixiban Nutritional status and feeding [place]  And tube feed Fluid status goals: CRRT - 71ml/hr UF Urinary catheter: no catheter Central lines: right introducer, left HD cathe Glucose control: euglycemic Mobility/therapy needs: OOB to chair Antibiotic de-escalation: stop cefepime and vancomycin  Daily labs:RFP, MG Code Status: full Family Communication: per cardiac surgery Disposition: ICU  Tamsen Snider, MD PGY2

## 2019-12-25 NOTE — Progress Notes (Signed)
Patient ID: Joseph Greer, male   DOB: 1955/07/14, 64 y.o.   MRN: [id number]     Advanced Heart Failure Rounding Note  PCP-Cardiologist: Candee Furbish, MD  AHF:  Dr. [name]   Patient Profile   64 y/o male w/ h/o Hep C s/p liver transplant in 2009 (followed at [facility]), Stage III CKD, HTN and T2DM admitted w/ NSTEMI and Afib w/ RVR, found to have severe 3VD and biventricular heart failure and ? PFO, LVEF 20-25%, RV moderately reduced. S/p CABG + MAZE + LA appendage clipping 7/16.    Subjective:    - 7/16: CABG x 5 with LIMA-LAD, RIMA-ramus, seq SVG-PD/PL, SVG-D; Maze; LA appendage clipping. - 7/18 Limited echo: LVEF 35-40%. RV normal. No pericardial effusion.  - Post operative course c/b AKI and oliguria. CRRT initiated 7/19.  - 7/19 Milrinone discontinued>>changed to dobutamine. Developed hypotension w/ increase in pressor support. NE and VP added. Empiric abx added for ?HCAP.  Became increasingly tachycardic and dobutamine stopped overnight.   Co-ox 58% today on milrinone 0.375.  CVVH pulling 150 cc/hr, now down to pre-op weight.  CVP 7 today.   AF. WBC trend 16>>18>>21>>16>>18>>13.4>>11.3. CXR no PNA.   Tacrolimus restarted after discussion with his transplant team.    R[facility] Procedural Findings (pre-op): Hemodynamics (mmHg) RA mean 8 RV 55/11 PA 60/30, mean 44 PCWP mean 36 Oxygen saturations: PA 62% AO 97% Cardiac Output (Fick) 5.3  Cardiac Index (Fick) 2.61 Cardiac Output (Thermo) 5.11 Cardiac Index (Thermo) 2.52  PVR 1.6 WU  Objective:   Weight Range: 84.1 kg Body mass index is 26.6 kg/m.   Vital Signs:   Temp:  [97.7 F (36.5 C)-98.4 F (36.9 C)] 98.4 F (36.9 C) (07/22 0700) Pulse Rate:  [76-105] 93 (07/22 0800) Resp:  [14-27] 21 (07/22 0800) BP: (86-140)/(52-80) 120/67 (07/22 0800) SpO2:  [95 %-100 %] 97 % (07/22 0800) Weight:  [84.1 kg] 84.1 kg (07/22 0500) Last BM Date: 12/24/19  Weight change: Filed Weights   12/23/19 0449 12/24/19 0453 12/25/19  0500  Weight: 93.5 kg 89.4 kg 84.1 kg    Intake/Output:   Intake/Output Summary (Last 24 hours) at 12/25/2019 0808 Last data filed at 12/25/2019 0800 Gross per 24 hour  Intake 2300.28 ml  Output 6304 ml  Net -4003.72 ml      Physical Exam   CVP 7 General: NAD Neck: No JVD, no thyromegaly or thyroid nodule.  Lungs: Decreased BS at bases.  CV: Nondisplaced PMI.  Heart regular S1/S2, no S3/S4, no murmur.  No peripheral edema.   Abdomen: Soft, nontender, no hepatosplenomegaly, no distention.  Skin: Intact without lesions or rashes.  Neurologic: Alert and oriented x 3.  Psych: Normal affect. Extremities: No clubbing or cyanosis.  HEENT: Normal.   Telemetry   NSR 80s (personally reviewed)  EKG    No new EKG to review today.   Labs    CBC Recent Labs    12/24/19 0433 12/25/19 0429  WBC 13.9* 11.3*  HGB 8.6* 9.1*  HCT 27.4* 28.9*  MCV 83.8 84.0  PLT 156 010   Basic Metabolic Panel Recent Labs    12/24/19 0433 12/24/19 0433 12/24/19 1627 12/25/19 0429  NA 135   < > 136 133*  K 4.8   < > 4.4 4.4  CL 101   < > 100 99  CO2 24   < > 27 24  GLUCOSE 235*   < > 190* 223*  BUN 30*   < > 30* 27*  CREATININE  3.01*   < > 2.61* 2.54*  CALCIUM 8.0*   < > 7.9* 7.9*  MG 2.6*  --   --  2.6*  PHOS 2.9   < > 2.2* 1.5*   < > = values in this interval not displayed.   Liver Function Tests Recent Labs    12/22/19 1530 12/23/19 0436 12/24/19 1627 12/25/19 0429  AST 31  --   --   --   ALT 20  --   --   --   ALKPHOS 66  --   --   --   BILITOT 0.9  --   --   --   PROT 5.4*  --   --   --   ALBUMIN 2.3*   < > 2.5* 2.5*   < > = values in this interval not displayed.   No results for input(s): LIPASE, AMYLASE in the last 72 hours. Cardiac Enzymes No results for input(s): CKTOTAL, CKMB, CKMBINDEX, TROPONINI in the last 72 hours.  BNP: BNP (last 3 results) No results for input(s): BNP in the last 8760 hours.  ProBNP (last 3 results) No results for input(s): PROBNP  in the last 8760 hours.   D-Dimer No results for input(s): DDIMER in the last 72 hours. Hemoglobin A1C No results for input(s): HGBA1C in the last 72 hours. Fasting Lipid Panel No results for input(s): CHOL, HDL, LDLCALC, TRIG, CHOLHDL, LDLDIRECT in the last 72 hours. Thyroid Function Tests No results for input(s): TSH, T4TOTAL, T3FREE, THYROIDAB in the last 72 hours.  Invalid input(s): FREET3  Other results:   Imaging    No results found.   Medications:     Scheduled Medications:  amiodarone  200 mg Oral Daily   Apremilast  1 tablet Oral Daily   aspirin EC  81 mg Oral Daily   atorvastatin  80 mg Oral Daily   B-complex with vitamin C  1 tablet Per Tube Daily   bisacodyl  10 mg Oral Daily   Or   bisacodyl  10 mg Rectal Daily   chlorhexidine gluconate (MEDLINE KIT)  15 mL Mouth Rinse BID   Chlorhexidine Gluconate Cloth  6 each Topical Daily   clopidogrel  75 mg Oral Daily   docusate sodium  200 mg Oral Daily   enoxaparin (LOVENOX) injection  30 mg Subcutaneous QHS   feeding supplement (PROSource TF)  45 mL Per Tube BID   hydrocortisone sod succinate (SOLU-CORTEF) inj  50 mg Intravenous Q8H   insulin aspart  0-24 Units Subcutaneous Q4H   pantoprazole  40 mg Oral Daily   sodium chloride flush  3 mL Intravenous Q12H   tacrolimus  1 mg Oral BID   venlafaxine XR  37.5 mg Oral Daily    Infusions:   prismasol BGK 4/2.5 400 mL/hr at 12/24/19 1957   sodium chloride Stopped (12/21/19 1602)   feeding supplement (VITAL 1.5 CAL) 60 mL/hr at 12/25/19 0800   lactated ringers Stopped (12/19/19 1448)   lactated ringers Stopped (12/20/19 1152)   milrinone     norepinephrine (LEVOPHED) Adult infusion Stopped (12/23/19 0248)   prismasol BGK 2/2.5 replacement solution 300 mL/hr at 12/24/19 2300   prismasol BGK 4/2.5 1,800 mL/hr at 12/25/19 0659    PRN Medications: heparin, insulin aspart, LORazepam, metoprolol tartrate, ondansetron (ZOFRAN) IV,  oxyCODONE, senna-docusate, sodium chloride, sodium chloride flush    Assessment/Plan   1. CAD: Severe 3VD on cath 7/21 => 60-70% ostial LAD, 80% mLAD, 95% ramus, 99% proximal LCx with TIMI-2 flow  down the rest of the LCx, occluded distal RCA.  Suspect out of hospital inferolateral MI.  Cardiac MRI showed that the inferolateral wall is likely not viable, but the remainder of the LV myocardium is likely viable. He had CABG 7/16 with LIMA-LAD, RIMA-ramus, seq SVG-PD/PL, SVG-D.  - Continue ASA/clopidogrel + atorvastatin.   2. Acute on chronic systolic CHF: Echo with EF 20-25%, moderately decreased RV systolic function. Cardiac MRI with LV EF 26%, RV EF 38%, near full thickness scar in inferolateral wall suggesting lack of viability in the LCx territory.  Now post-CABG. Repeat limited echo 7/20 showed LVEF 20-25%, RV normal, no pericardial effusion. CVP 7 today (decreased), weight down on CVVH to pre-op weight.  Co-ox 56% today on milrinone 0.375. MAP stable.  - Decrease milrinone to 0.25.  - Can cut back on UF rate with CVP 7, will drop to net 50 cc/hr.  Suspect he can stop CVVH soon and assess for iHD needs.  3. Atrial fibrillation: Patient admitted with afib/RVR, now in NSR. S/p Maze + LAA clipping on 7/16.  - Continue amiodarone gtt for now.  - Resume full anticoagulation when stable per TCTS. Currently getting DVT prophylaxis enoxaparin.  Would favor eventual Eliquis 5 mg bid and stopping Plavix.  4. AKI on CKD stage 3: Creatine has been stable 1.9-2.3 range in hospital pre-op.  Was 1.3 back in 4/21. Creatinine rose to 6.46 post-op. Anuric. Suspect post-op ATN. CRRT initiated 7/19. Minimal UOP. Weight now back to pre-op.  - Cut back on UF rate via CVVH as above, think we can stop soon and watch for iHD need (I am concerned that he will be HD-dependent).  5. Type 2 diabetes: SSI.  6. Liver transplant for HCV: tacrolimus currently on hold (? Toxicity, concomitate use w/ amiodarone can increase  levels). On 7/18, level was 6.6 (not sure this was true trough) and within goal range for trough.  - He has been restarted on dose of tacrolimus in discussion with his transplant team.  - Currently on stress dose steroids (not on steroids at home), plan to stop after today.  7. ID: 7/19 developed hypotension w/ increasing pressor support, increasing leukocytosis and CXR w/ hazy opacities. Started on empiric abx for ? HCAP with vancomycin/cefepime => now off. BC NGTD. WBCs decreasing.  8. Neuro: Alert/oriented today, improved.   Mobilize with PT.   CRITICAL CARE Performed by: Loralie Champagne  Total critical care time: 35 minutes  Critical care time was exclusive of separately billable procedures and treating other patients.  Critical care was necessary to treat or prevent imminent or life-threatening deterioration.  Critical care was time spent personally by me on the following activities: development of treatment plan with patient and/or surrogate as well as nursing, discussions with consultants, evaluation of patient's response to treatment, examination of patient, obtaining history from patient or surrogate, ordering and performing treatments and interventions, ordering and review of laboratory studies, ordering and review of radiographic studies, pulse oximetry and re-evaluation of patient's condition.  Loralie Champagne 12/25/2019 8:08 AM

## 2019-12-25 NOTE — Progress Notes (Addendum)
TCTS DAILY ICU PROGRESS NOTE                   [place]            ,[place] [address]          [phone]   6 Days Post-Op Procedure(s) (LRB): CORONARY ARTERY BYPASS GRAFTING (CABG) TIMES  X 5,   USING BILATERAL MAMMARY ARTERY AND RIGHT LEG GREATER SAPHENOUS VEIN HARVESTED ENDOSCOPICALLY (N/A) TRANSESOPHAGEAL ECHOCARDIOGRAM (TEE) (N/A) INDOCYANINE GREEN FLUORESCENCE IMAGING (ICG) (N/A) MAZE (N/A) CLIPPING OF ATRIAL APPENDAGE USING ATRICURE CLIP SIZE 50MM (N/A)  Total Length of Stay:  LOS: 13 days   Subjective: Feeling better, eating small amounts, making small amt of urine, on RA  Objective: Vital signs in last 24 hours: Temp:  [97.3 F (36.3 C)-98.4 F (36.9 C)] 98.4 F (36.9 C) (07/22 0700) Pulse Rate:  [63-105] 92 (07/22 0700) Cardiac Rhythm: Normal sinus rhythm (07/22 0400) Resp:  [14-27] 20 (07/22 0700) BP: (86-140)/(52-80) 112/59 (07/22 0700) SpO2:  [93 %-100 %] 97 % (07/22 0700) Weight:  [84.1 kg] 84.1 kg (07/22 0500)  Filed Weights   12/23/19 0449 12/24/19 0453 12/25/19 0500  Weight: 93.5 kg 89.4 kg 84.1 kg    Weight change: -5.3 kg   Hemodynamic parameters for last 24 hours: CVP:  [10 mmHg-25 mmHg] 12 mmHg  Intake/Output from previous day: 07/21 0701 - 07/22 0700 In: 2270.5 [P.O.:380; I.V.:320.5; NG/GT:1570] Out: 6039 [Chest Tube:110]  Intake/Output this shift: No intake/output data recorded.  Current Meds: Scheduled Meds: . amiodarone  200 mg Oral Daily  . Apremilast  1 tablet Oral Daily  . aspirin EC  81 mg Oral Daily  . atorvastatin  80 mg Oral Daily  . B-complex with vitamin C  1 tablet Per Tube Daily  . bisacodyl  10 mg Oral Daily   Or  . bisacodyl  10 mg Rectal Daily  . chlorhexidine gluconate (MEDLINE KIT)  15 mL Mouth Rinse BID  . Chlorhexidine Gluconate Cloth  6 each Topical Daily  . clopidogrel  75 mg Oral Daily  . docusate sodium  200 mg Oral Daily  . enoxaparin (LOVENOX) injection  30 mg Subcutaneous QHS   . feeding supplement (PROSource TF)  45 mL Per Tube BID  . hydrocortisone sod succinate (SOLU-CORTEF) inj  50 mg Intravenous Q8H  . insulin aspart  0-24 Units Subcutaneous Q4H  . pantoprazole  40 mg Oral Daily  . sodium chloride flush  3 mL Intravenous Q12H  . tacrolimus  1 mg Oral BID  . venlafaxine XR  37.5 mg Oral Daily   Continuous Infusions: .  prismasol BGK 4/2.5 400 mL/hr at 12/24/19 1957  . sodium chloride Stopped (12/21/19 1602)  . feeding supplement (VITAL 1.5 CAL) 1,000 mL (12/24/19 2130)  . lactated ringers Stopped (12/19/19 1448)  . lactated ringers Stopped (12/20/19 1152)  . milrinone 0.375 mcg/kg/min (12/25/19 0700)  . norepinephrine (LEVOPHED) Adult infusion Stopped (12/23/19 0248)  . prismasol BGK 2/2.5 replacement solution 300 mL/hr at 12/24/19 2300  . prismasol BGK 4/2.5 1,800 mL/hr at 12/25/19 0659   PRN Meds:.heparin, insulin aspart, LORazepam, metoprolol tartrate, ondansetron (ZOFRAN) IV, oxyCODONE, senna-docusate, sodium chloride, sodium chloride flush  General appearance: alert, cooperative and no distress Heart: regular rate and rhythm Lungs: clear anteriorly Abdomen: soft, non-tender Extremities: no edema Wound: incis healing well  Lab Results: CBC: Recent Labs    12/24/19 0433 12/25/19 0429  WBC 13.9* 11.3*  HGB 8.6* 9.1*  HCT 27.4*  28.9*  PLT 156 168   BMET:  Recent Labs    12/24/19 1627 12/25/19 0429  NA 136 133*  K 4.4 4.4  CL 100 99  CO2 27 24  GLUCOSE 190* 223*  BUN 30* 27*  CREATININE 2.61* 2.54*  CALCIUM 7.9* 7.9*    CMET: Lab Results  Component Value Date   WBC 11.3 (H) 12/25/2019   HGB 9.1 (L) 12/25/2019   HCT 28.9 (L) 12/25/2019   PLT 168 12/25/2019   GLUCOSE 223 (H) 12/25/2019   CHOL 219 (H) 12/12/2019   TRIG 117 12/12/2019   HDL 38 (L) 12/12/2019   LDLCALC 158 (H) 12/12/2019   ALT 20 12/22/2019   AST 31 12/22/2019   NA 133 (L) 12/25/2019   K 4.4 12/25/2019   CL 99 12/25/2019   CREATININE 2.54 (H) 12/25/2019    BUN 27 (H) 12/25/2019   CO2 24 12/25/2019   TSH 4.855 (H) 12/11/2019   INR 1.4 (H) 12/19/2019   HGBA1C 5.9 (H) 12/11/2019      PT/INR: No results for input(s): LABPROT, INR in the last 72 hours. Radiology: No results found.   Assessment/Plan: S/P Procedure(s) (LRB): CORONARY ARTERY BYPASS GRAFTING (CABG) TIMES  X 5,   USING BILATERAL MAMMARY ARTERY AND RIGHT LEG GREATER SAPHENOUS VEIN HARVESTED ENDOSCOPICALLY (N/A) TRANSESOPHAGEAL ECHOCARDIOGRAM (TEE) (N/A) INDOCYANINE GREEN FLUORESCENCE IMAGING (ICG) (N/A) MAZE (N/A) CLIPPING OF ATRIAL APPENDAGE USING ATRICURE CLIP SIZE 50MM (N/A)  1 conts to progress POD#6 2 off levo, milrinone at .375, CO-Ox 58 3 conts CRRT per neph, made a scant amt of urine, creat slightly lower at 2.54 4 tacrolimus restarted at lower dose, on steroid taper 5 stable anemia 6 leukocytosis trend improving 7 no fevers 8 sinus with PAC's/PVC's, K+/Mg++ ok 9 BS control a bit variable, cont SSI-, on steroids, minor pseudohyponatremia 10 CT only 110 yesterday- poss d/c soon 11 TF's - cont- taking about 20 percent po 12 cont therapies, mobilize as able John Giovanni PA-C Pager [phone] 12/25/2019 7:06 AM   Pt seen and examined; chart and objective information reviewed. Agree with PA documentation and plan.  Continue HD; continue hemodyn support with milrinone.   Antrone [place]. Orvan Seen, [place]

## 2019-12-25 NOTE — Progress Notes (Signed)
Mishael Haran Therrien Admit Date: 12/11/2019 12/25/2019 [name] Requesting Physician:  [name] MD  Reason for Consult:  AKI on CKD3 SUBJECTIVE Tolerating crrt. Has been having hallucinations and has been delirious. Still anuric.off epineph gtt.  CRRT Modality-CVVHDF, M100 dialyzer Pre: 400 Post 300 BFR 279m/hr DFR 1.8L/hr UF goal: net UF/24hrs, uf rate 120cc/hr FF 9% Access: LIJ temporary dialysis catheter   Creatinine, Ser (mg/dL)  Date Value  12/25/2019 2.54 (H)  12/24/2019 2.61 (H)  12/24/2019 3.01 (H)  12/23/2019 3.24 (H)  12/23/2019 4.07 (H)  12/22/2019 6.46 (H)  12/22/2019 5.85 (H)  12/21/2019 4.99 (H)  12/21/2019 4.22 (H)  12/20/2019 3.57 (H)  ] I/Os: I/O last 3 completed shifts: In: 3407.6 [P.O.:410; I.V.:687.6; NG/GT:2210; IV Piggyback:100] Out: 9102 [[VQXIH:0388 Chest Tube:220]   ROS Balance of 12 systems is negative w/ exceptions as above  PMH  Past Medical History:  Diagnosis Date  . Anxiety   . CKD (chronic kidney disease), stage III   . Depression   . Diabetes mellitus type 2, noninsulin dependent ([place]   . Hepatitis C   . Hypertension   . Psoriasis   . [place] ([place] Past Surgical History:  Procedure Laterality Date  . CLIPPING OF ATRIAL APPENDAGE N/A 12/19/2019   Procedure: CLIPPING OF ATRIAL APPENDAGE USING ATRICURE CLIP SIZE 50MM;  Surgeon: AWonda Olds MD;  [place]  Service: Open Heart Surgery;  Laterality: N/A;  . COLONOSCOPY    . COLONOSCOPY WITH PROPOFOL N/A 06/01/2017   Procedure: COLONOSCOPY WITH PROPOFOL;  Surgeon: RRogene Houston MD;  [facility];  Service: Endoscopy;  Laterality: N/A;  7:30  . CORONARY ARTERY BYPASS GRAFT N/A 12/19/2019   Procedure: CORONARY ARTERY BYPASS GRAFTING (CABG) TIMES  X 5,   USING BILATERAL MAMMARY ARTERY AND RIGHT LEG GREATER SAPHENOUS VEIN HARVESTED ENDOSCOPICALLY;  Surgeon: AWonda Olds MD;  [place]  Service: Open Heart Surgery;   Laterality: N/A;  . HERNIA REPAIR Right   . LEFT HEART CATH AND CORONARY ANGIOGRAPHY N/A 12/12/2019   Procedure: LEFT HEART CATH AND CORONARY ANGIOGRAPHY;  Surgeon: VJettie Booze MD;  [facility] [facility];  Service: Cardiovascular;  Laterality: N/A;  . LIVER BIOPSY    . LIVER SURGERY    . MAZE N/A 12/19/2019   Procedure: MAZE;  Surgeon: AWonda Olds MD;  [place]  Service: Open Heart Surgery;  Laterality: N/A;  . POLYPECTOMY  06/01/2017   Procedure: POLYPECTOMY;  Surgeon: RRogene Houston MD;  [facility];  Service: Endoscopy;;  polyp at sigmoid colon x2, ascending colon polyp, hepatic flexure polyp  . RIGHT HEART CATH N/A 12/17/2019   Procedure: RIGHT HEART CATH;  Surgeon: MLarey Dresser MD;  [place];  Service: Cardiovascular;  Laterality: N/A;  . TEE WITHOUT CARDIOVERSION N/A 12/19/2019   Procedure: TRANSESOPHAGEAL ECHOCARDIOGRAM (TEE);  Surgeon: AWonda Olds MD;  [place]  Service: Open Heart Surgery;  Laterality: N/A;  . UPPER GASTROINTESTINAL ENDOSCOPY     FH  Family History  Problem Relation Age of Onset  . Diabetes Mother   . Heart disease Father   . Heart disease Son    SMerna reports that he quit smoking about 12 years ago. His smoking use included cigarettes. He smoked 1.00 pack per day. He has never used smokeless tobacco. He reports that he does not drink alcohol and does not use drugs. Allergies  No Known Allergies Home medications Prior to Admission medications   Medication Sig Start Date End Date Taking? Authorizing Provider  amLODipine (NORVASC) 5 MG tablet Take 5 mg by mouth daily. 10/28/19  Yes [provider]  Apremilast (OTEZLA) 30 MG TABS Take 1 tablet by mouth 2 (two) times daily. 06/16/19  Yes [provider]  LORazepam (ATIVAN) 1 MG tablet Take 1 mg by mouth 2 (two) times daily as needed (panic attacks).  11/28/19  Yes [provider]  nitroGLYCERIN (NITROSTAT) 0.4 MG  SL tablet Place 1 tablet (0.4 mg total) under the tongue every 5 (five) minutes x 3 doses as needed for chest pain. 03/27/13  Yes Rai, Ripudeep K, MD  tacrolimus (PROGRAF) 1 MG capsule Take 2 mg by mouth 2 (two) times daily.    Yes [provider]  venlafaxine XR (EFFEXOR-XR) 75 MG 24 hr capsule Take 75 mg by mouth daily.   Yes [provider]  buPROPion (WELLBUTRIN) 75 MG tablet Take 75 mg by mouth 2 (two) times daily.  Patient not taking: Reported on 12/11/2019    [provider]  FLUoxetine (PROZAC) 40 MG capsule Take 40 mg by mouth daily. Patient not taking: Reported on 12/11/2019    [provider]  lisinopril (PRINIVIL,ZESTRIL) 20 MG tablet Take 20 mg by mouth daily. Patient not taking: Reported on 12/11/2019    [provider]  mycophenolate (CELLCEPT) 250 MG capsule Take 250 mg by mouth 2 (two) times daily.  Patient not taking: Reported on 12/11/2019    [provider]  triamcinolone ointment (KENALOG) 0.1 % Apply 1 application topically 2 (two) times daily. Patient not taking: Reported on 12/11/2019    [provider]    Current Medications Scheduled Meds: . amiodarone  200 mg Oral Daily  . apixaban  5 mg Oral BID  . Apremilast  1 tablet Oral Daily  . aspirin EC  81 mg Oral Daily  . atorvastatin  80 mg Oral Daily  . B-complex with vitamin C  1 tablet Per Tube Daily  . bisacodyl  10 mg Oral Daily   Or  . bisacodyl  10 mg Rectal Daily  . chlorhexidine gluconate (MEDLINE KIT)  15 mL Mouth Rinse BID  . Chlorhexidine Gluconate Cloth  6 each Topical Daily  . docusate sodium  200 mg Oral Daily  . feeding supplement (PROSource TF)  45 mL Per Tube BID  . hydrocortisone sod succinate (SOLU-CORTEF) inj  50 mg Intravenous Q8H  . insulin aspart  0-24 Units Subcutaneous Q4H  . pantoprazole  40 mg Oral Daily  . sodium chloride flush  3 mL Intravenous Q12H  . tacrolimus  1 mg Oral BID  . venlafaxine XR  37.5 mg Oral Daily   Continuous  Infusions: .  prismasol BGK 4/2.5 400 mL/hr at 12/25/19 0851  . sodium chloride Stopped (12/21/19 1602)  . feeding supplement (VITAL 1.5 CAL) 60 mL/hr at 12/25/19 1200  . lactated ringers Stopped (12/19/19 1448)  . lactated ringers Stopped (12/20/19 1152)  . milrinone 0.25 mcg/kg/min (12/25/19 1200)  . norepinephrine (LEVOPHED) Adult infusion Stopped (12/23/19 0248)  . prismasol BGK 2/2.5 replacement solution 300 mL/hr at 12/24/19 2300  . prismasol BGK 4/2.5 1,800 mL/hr at 12/25/19 1105   PRN Meds:.heparin, insulin aspart, LORazepam, metoprolol tartrate, ondansetron (ZOFRAN) IV, oxyCODONE, senna-docusate, sodium chloride, sodium chloride flush  CBC Recent Labs  Lab 12/23/19 0436 12/24/19 0433 12/25/19 0429  WBC 18.2* 13.9* 11.3*  HGB 8.7* 8.6* 9.1*  HCT  27.6* 27.4* 28.9*  MCV 84.1 83.8 84.0  PLT 156 156 990   Basic Metabolic Panel Recent Labs  Lab 12/22/19 0341 12/22/19 0341 12/22/19 1155 12/22/19 1411 12/22/19 1539 12/23/19 0436 12/23/19 1544 12/24/19 0433 12/24/19 1627 12/25/19 0429  NA 131*   < > 132* 132*  --  135 134* 135 136 133*  K 4.8   < > 4.9 4.6  --  5.2* 4.8 4.8 4.4 4.4  CL 98  --  99  --   --  101 99 101 100 99  CO2 19*  --  17*  --   --  21* 24 24 27 24   GLUCOSE 134*  --  128*  --   --  162* 240* 235* 190* 223*  BUN 51*  --  55*  --   --  34* 29* 30* 30* 27*  CREATININE 5.85*  --  6.46*  --   --  4.07* 3.24* 3.01* 2.61* 2.54*  CALCIUM 7.6*  --  7.9*  --   --  8.0* 7.8* 8.0* 7.9* 7.9*  PHOS  --   --  6.9*  --  7.3* 5.1* 3.8 2.9 2.2* 1.5*   < > = values in this interval not displayed.    Physical Exam  Blood pressure (!) 115/61, pulse 92, temperature 98.4 F (36.9 C), temperature source Oral, resp. rate 15, height 5' 10"  (1.778 m), weight 84.1 kg, SpO2 98 %. GEN: NAD, drowsy Neck: LIJ temp dialysis catheter: c/d/i CV: rrr, normal S1-S2 PULM: Diminished air entry left greater than right base, no overt w/r/r/c, unlabored, bilateral chest  expansion ABD: Soft, nontender nondistended SKIN: No rashes or lesions , midchest wall incision c/d/i EXT: trace edema bilateral le's NEURO: speech slightly slurred   Assessment 59M with oliguric AoCKD3 in setting of LHF 7/9 and 5V CABG 7/16 + MAZE.  Suspect this is ATN following cardiac surgery  1. AoCKD3 (now anuric), BL SCr 1.2 to 1.5, no outpt nephrology; suspect ATN; exhibiting early uremic symptoms with loss of appetite along with nausea/vomiting (Cr continues to rise). No response in urine output with diuretics initially. Started CRRT on 12/22/2019 for uremic symptoms and volume. 2. NSTEMI status post 5 vessel CABG 7/16 3. Atrial fibrillation status post maze with left atrial appendage clipping 7/16.  On amiodarone 4. Acute on chronic sCHF/cardiogenic shock on epinephrine and milrinone 5. Status post liver transplant 2009 ([facility]) on tacrolimus followed by [facility] 6. Hypertension 7. DM2 8. Delirium, critical care delirium?  Plan 1. Continue with CRRT for now. k better with postfilter 2k bath. Maintaining net neg 50cc/hr 2. Can consider ct head if no improvement with mentation 3. Will continue to monitor for renal recovery moving forward 4. Daily weights, Strict I/Os, Avoid nephrotoxins (NSAIDs, judicious IV Contrast)   [name], MD [facility] 12/25/2019, 1:24 PM

## 2019-12-26 ENCOUNTER — Inpatient Hospital Stay (HOSPITAL_COMMUNITY): Payer: Medicare HMO

## 2019-12-26 LAB — RENAL FUNCTION PANEL
Albumin: 2.3 g/dL — ABNORMAL LOW (ref 3.5–5.0)
Albumin: 2.4 g/dL — ABNORMAL LOW (ref 3.5–5.0)
Anion gap: 7 (ref 5–15)
Anion gap: 10 (ref 5–15)
BUN: 23 mg/dL (ref 8–23)
BUN: 22 mg/dL (ref 8–23)
CO2: 27 mmol/L (ref 22–32)
CO2: 24 mmol/L (ref 22–32)
Calcium: 7.5 mg/dL — ABNORMAL LOW (ref 8.9–10.3)
Calcium: 7.5 mg/dL — ABNORMAL LOW (ref 8.9–10.3)
Chloride: 101 mmol/L (ref 98–111)
Chloride: 98 mmol/L (ref 98–111)
Creatinine, Ser: 2.16 mg/dL — ABNORMAL HIGH (ref 0.61–1.24)
Creatinine, Ser: 1.99 mg/dL — ABNORMAL HIGH (ref 0.61–1.24)
GFR calc Af Amer: 37 mL/min — ABNORMAL LOW (ref >60)
GFR calc Af Amer: 41 mL/min — ABNORMAL LOW (ref >60)
GFR calc non Af Amer: 32 mL/min — ABNORMAL LOW (ref >60)
GFR calc non Af Amer: 35 mL/min — ABNORMAL LOW (ref >60)
Glucose, Bld: 206 mg/dL — ABNORMAL HIGH (ref 70–99)
Glucose, Bld: 274 mg/dL — ABNORMAL HIGH (ref 70–99)
Phosphorus: <1 mg/dL — CL (ref 2.5–4.6)
Phosphorus: 4 mg/dL (ref 2.5–4.6)
Potassium: 4.3 mmol/L (ref 3.5–5.1)
Potassium: 4.3 mmol/L (ref 3.5–5.1)
Sodium: 135 mmol/L (ref 135–145)
Sodium: 132 mmol/L — ABNORMAL LOW (ref 135–145)

## 2019-12-26 LAB — BPAM RBC
Blood Product Expiration Date: 202108182359
Blood Product Expiration Date: 202108192359
Blood Product Expiration Date: 202108192359
Blood Product Expiration Date: 202108192359
ISSUE DATE / TIME: 202107191703
ISSUE DATE / TIME: 202107192321
Unit Type and Rh: 5100
Unit Type and Rh: 5100
Unit Type and Rh: 5100
Unit Type and Rh: 5100

## 2019-12-26 LAB — TYPE AND SCREEN
ABO/RH(D): O POS
Antibody Screen: NEGATIVE
Unit division: 0
Unit division: 0
Unit division: 0
Unit division: 0

## 2019-12-26 LAB — CBC
HCT: 27.7 % — ABNORMAL LOW (ref 39.0–52.0)
Hemoglobin: 8.6 g/dL — ABNORMAL LOW (ref 13.0–17.0)
MCH: 26.1 pg (ref 26.0–34.0)
MCHC: 31 g/dL (ref 30.0–36.0)
MCV: 84.2 fL (ref 80.0–100.0)
Platelets: 137 10*3/uL — ABNORMAL LOW (ref 150–400)
RBC: 3.29 MIL/uL — ABNORMAL LOW (ref 4.22–5.81)
RDW: 15.9 % — ABNORMAL HIGH (ref 11.5–15.5)
WBC: 9.6 10*3/uL (ref 4.0–10.5)
nRBC: 6.5 % — ABNORMAL HIGH (ref 0.0–0.2)

## 2019-12-26 LAB — GLUCOSE, CAPILLARY
Glucose-Capillary: 148 mg/dL — ABNORMAL HIGH (ref 70–99)
Glucose-Capillary: 165 mg/dL — ABNORMAL HIGH (ref 70–99)
Glucose-Capillary: 166 mg/dL — ABNORMAL HIGH (ref 70–99)
Glucose-Capillary: 170 mg/dL — ABNORMAL HIGH (ref 70–99)
Glucose-Capillary: 172 mg/dL — ABNORMAL HIGH (ref 70–99)
Glucose-Capillary: 205 mg/dL — ABNORMAL HIGH (ref 70–99)
Glucose-Capillary: 216 mg/dL — ABNORMAL HIGH (ref 70–99)

## 2019-12-26 LAB — PATHOLOGIST SMEAR REVIEW

## 2019-12-26 LAB — COOXEMETRY PANEL
Carboxyhemoglobin: 1.6 % — ABNORMAL HIGH (ref 0.5–1.5)
Methemoglobin: 1.2 % (ref 0.0–1.5)
O2 Saturation: 51.1 %
Total hemoglobin: 9 g/dL — ABNORMAL LOW (ref 12.0–16.0)

## 2019-12-26 LAB — MAGNESIUM: Magnesium: 2.6 mg/dL — ABNORMAL HIGH (ref 1.7–2.4)

## 2019-12-26 LAB — PHOSPHORUS: Phosphorus: 1.1 mg/dL — ABNORMAL LOW (ref 2.5–4.6)

## 2019-12-26 MED ORDER — MELATONIN 3 MG PO TABS
3.0000 mg | ORAL_TABLET | Freq: Every evening | ORAL | Status: DC | PRN
  Administered 2019-12-26: 3 mg via ORAL
  Filled 2019-12-26: qty 1

## 2019-12-26 MED ORDER — SODIUM PHOSPHATES 45 MMOLE/15ML IV SOLN
30.0000 mmol | Freq: Once | INTRAVENOUS | Status: AC
  Administered 2019-12-26: 30 mmol via INTRAVENOUS
  Filled 2019-12-26 (×2): qty 10

## 2019-12-26 MED ORDER — SODIUM PHOSPHATES 45 MMOLE/15ML IV SOLN
45.0000 mmol | Freq: Once | INTRAVENOUS | Status: DC
  Filled 2019-12-26: qty 15

## 2019-12-26 MED ORDER — CHLORHEXIDINE GLUCONATE 0.12 % MT SOLN
OROMUCOSAL | Status: AC
  Administered 2019-12-26: 15 mL via OROMUCOSAL
  Filled 2019-12-26: qty 15

## 2019-12-26 MED FILL — Vancomycin HCl For IV Soln 1 GM (Base Equivalent): INTRAVENOUS | Qty: 3000 | Status: AC

## 2019-12-26 NOTE — Progress Notes (Signed)
Critical phos level reported to [facility] MD Dr. Lucile Shutters. Awaiting further orders.

## 2019-12-26 NOTE — Progress Notes (Signed)
Patient ID: Joseph Greer, male   DOB: Jul 03, 1955, 64 y.o.   MRN: [id number] EVENING ROUNDS NOTE :     [address]       [place] [address]             [phone]                 7 Days Post-Op Procedure(s) (LRB): CORONARY ARTERY BYPASS GRAFTING (CABG) TIMES  X 5,   USING BILATERAL MAMMARY ARTERY AND RIGHT LEG GREATER SAPHENOUS VEIN HARVESTED ENDOSCOPICALLY (N/A) TRANSESOPHAGEAL ECHOCARDIOGRAM (TEE) (N/A) INDOCYANINE GREEN FLUORESCENCE IMAGING (ICG) (N/A) MAZE (N/A) CLIPPING OF ATRIAL APPENDAGE USING ATRICURE CLIP SIZE 50MM (N/A)  Total Length of Stay:  LOS: 14 days  BP (!) 108/58   Pulse 90   Temp 97.6 F (36.4 C)   Resp 21   Ht 5\' 10"  (1.778 m)   Wt 84.4 kg   SpO2 100%   BMI 26.70 kg/m   .Intake/Output      07/22 0701 - 07/23 0700 07/23 0701 - 07/24 0700   P.O. 50 120   I.V. (mL/kg) 237.7 (2.8) 68.8 (0.8)   NG/GT 1520 600   IV Piggyback  260   Total Intake(mL/kg) 1807.7 (21.4) 1048.7 (12.4)   Urine (mL/kg/hr)     Other 2055 1314   Stool 0    Chest Tube     Total Output 2055 1314   Net -247.3 -265.3        Stool Occurrence 2 x      .  prismasol BGK 4/2.5 400 mL/hr at 12/26/19 1049  . sodium chloride Stopped (12/21/19 1602)  . dexmedetomidine (PRECEDEX) IV infusion Stopped (12/26/19 0947)  . feeding supplement (VITAL 1.5 CAL) 1,000 mL (12/26/19 0928)  . lactated ringers Stopped (12/19/19 1448)  . lactated ringers Stopped (12/20/19 1152)  . milrinone 0.25 mcg/kg/min (12/26/19 1700)  . prismasol BGK 2/2.5 replacement solution 300 mL/hr at 12/26/19 0958  . prismasol BGK 4/2.5 1,800 mL/hr at 12/26/19 1802     Lab Results  Component Value Date   WBC 9.6 12/26/2019   HGB 8.6 (L) 12/26/2019   HCT 27.7 (L) 12/26/2019   PLT 137 (L) 12/26/2019   GLUCOSE 274 (H) 12/26/2019   CHOL 219 (H) 12/12/2019   TRIG 117 12/12/2019   HDL 38 (L) 12/12/2019   LDLCALC 158 (H) 12/12/2019   ALT 20 12/22/2019   AST 31 12/22/2019   NA 132 (L)  12/26/2019   K 4.3 12/26/2019   CL 98 12/26/2019   CREATININE 1.99 (H) 12/26/2019   BUN 22 12/26/2019   CO2 24 12/26/2019   TSH 4.855 (H) 12/11/2019   INR 1.4 (H) 12/19/2019   HGBA1C 5.9 (H) 12/11/2019    Stable day Mental status improved  Cr 1.99, on cvvh   [name] MD  Beeper [phone] Office [phone] 12/26/2019 6:14 PM

## 2019-12-26 NOTE — Progress Notes (Signed)
Joseph Greer Admit Date: 12/11/2019 12/26/2019 Joseph Greer Requesting Physician:  [name] MD  Reason for Consult:  AKI on CKD3 SUBJECTIVE Tolerating crrt.  Off pressors.  Feels much better as compared to yesterday although had a rough night and did not get much sleep.  CRRT running well.  No urine output  CRRT Modality-CVVHDF, M100 dialyzer Pre: 400 Post 300 BFR 234m/hr DFR 1.8L/hr UF goal: net UF/24hrs, uf rate 120cc/hr FF 9% Access: LIJ temporary dialysis catheter   Creatinine, Ser (mg/dL)  Date Value  12/26/2019 2.16 (H)  12/25/2019 2.48 (H)  12/25/2019 2.54 (H)  12/24/2019 2.61 (H)  12/24/2019 3.01 (H)  12/23/2019 3.24 (H)  12/23/2019 4.07 (H)  12/22/2019 6.46 (H)  12/22/2019 5.85 (H)  12/21/2019 4.99 (H)  ] I/Os: I/O last 3 completed shifts: In: 3162.8 [P.O.:430; I.V.:362.8; NG/GT:2370] Out: 52330[Other:5555]   ROS Balance of 12 systems is negative w/ exceptions as above  PMH  Past Medical History:  Diagnosis Date  . Anxiety   . CKD (chronic kidney disease), stage III   . Depression   . Diabetes mellitus type 2, noninsulin dependent ([place]   . Hepatitis C   . Hypertension   . Psoriasis   . Status post liver transplant ([place]    [place] Past Surgical History:  Procedure Laterality Date  . CLIPPING OF ATRIAL APPENDAGE N/A 12/19/2019   Procedure: CLIPPING OF ATRIAL APPENDAGE USING ATRICURE CLIP SIZE 50MM;  Surgeon: AWonda Olds MD;  [place]  Service: Open Heart Surgery;  Laterality: N/A;  . COLONOSCOPY    . COLONOSCOPY WITH PROPOFOL N/A 06/01/2017   Procedure: COLONOSCOPY WITH PROPOFOL;  Surgeon: RRogene Houston MD;  [facility];  Service: Endoscopy;  Laterality: N/A;  7:30  . CORONARY ARTERY BYPASS GRAFT N/A 12/19/2019   Procedure: CORONARY ARTERY BYPASS GRAFTING (CABG) TIMES  X 5,   USING BILATERAL MAMMARY ARTERY AND RIGHT LEG GREATER SAPHENOUS VEIN HARVESTED ENDOSCOPICALLY;  Surgeon: AWonda Olds MD;  [place]  Service: Open Heart Surgery;  Laterality: N/A;  . HERNIA REPAIR Right   . LEFT HEART CATH AND CORONARY ANGIOGRAPHY N/A 12/12/2019   Procedure: LEFT HEART CATH AND CORONARY ANGIOGRAPHY;  Surgeon: VJettie Booze MD;  [place];  Service: Cardiovascular;  Laterality: N/A;  . LIVER BIOPSY    . LIVER SURGERY    . MAZE N/A 12/19/2019   Procedure: MAZE;  Surgeon: AWonda Olds MD;  [place]  Service: Open Heart Surgery;  Laterality: N/A;  . POLYPECTOMY  06/01/2017   Procedure: POLYPECTOMY;  Surgeon: RRogene Houston MD;  [facility];  Service: Endoscopy;;  polyp at sigmoid colon x2, ascending colon polyp, hepatic flexure polyp  . RIGHT HEART CATH N/A 12/17/2019   Procedure: RIGHT HEART CATH;  Surgeon: MLarey Dresser MD;  [place] [facility];  Service: Cardiovascular;  Laterality: N/A;  . TEE WITHOUT CARDIOVERSION N/A 12/19/2019   Procedure: TRANSESOPHAGEAL ECHOCARDIOGRAM (TEE);  Surgeon: AWonda Olds MD;  [place]  Service: Open Heart Surgery;  Laterality: N/A;  . UPPER GASTROINTESTINAL ENDOSCOPY     FH  Family History  Problem Relation Age of Onset  . Diabetes Mother   . Heart disease Father   . Heart disease Son    [place] reports that he quit smoking about 12 years ago. His smoking use included cigarettes. He smoked 1.00 pack per day. He has never used smokeless tobacco.  He reports that he does not drink alcohol and does not use drugs. Allergies No Known Allergies Home medications Prior to Admission medications   Medication Sig Start Date End Date Taking? Authorizing Provider  amLODipine (NORVASC) 5 MG tablet Take 5 mg by mouth daily. 10/28/19  Yes [provider]  Apremilast (OTEZLA) 30 MG TABS Take 1 tablet by mouth 2 (two) times daily. 06/16/19  Yes [provider]  LORazepam (ATIVAN) 1 MG tablet Take 1 mg by mouth 2 (two) times daily as needed (panic attacks).  11/28/19  Yes [provider]   nitroGLYCERIN (NITROSTAT) 0.4 MG SL tablet Place 1 tablet (0.4 mg total) under the tongue every 5 (five) minutes x 3 doses as needed for chest pain. 03/27/13  Yes Rai, Ripudeep K, MD  tacrolimus (PROGRAF) 1 MG capsule Take 2 mg by mouth 2 (two) times daily.    Yes [provider]  venlafaxine XR (EFFEXOR-XR) 75 MG 24 hr capsule Take 75 mg by mouth daily.   Yes [provider]  buPROPion (WELLBUTRIN) 75 MG tablet Take 75 mg by mouth 2 (two) times daily.  Patient not taking: Reported on 12/11/2019    [provider]  FLUoxetine (PROZAC) 40 MG capsule Take 40 mg by mouth daily. Patient not taking: Reported on 12/11/2019    [provider]  lisinopril (PRINIVIL,ZESTRIL) 20 MG tablet Take 20 mg by mouth daily. Patient not taking: Reported on 12/11/2019    [provider]  mycophenolate (CELLCEPT) 250 MG capsule Take 250 mg by mouth 2 (two) times daily.  Patient not taking: Reported on 12/11/2019    [provider]  triamcinolone ointment (KENALOG) 0.1 % Apply 1 application topically 2 (two) times daily. Patient not taking: Reported on 12/11/2019    [provider]    Current Medications Scheduled Meds: . amiodarone  200 mg Oral Daily  . apixaban  5 mg Oral BID  . Apremilast  1 tablet Oral Daily  . aspirin EC  81 mg Oral Daily  . atorvastatin  80 mg Oral Daily  . B-complex with vitamin C  1 tablet Per Tube Daily  . bisacodyl  10 mg Oral Daily   Or  . bisacodyl  10 mg Rectal Daily  . chlorhexidine gluconate (MEDLINE KIT)  15 mL Mouth Rinse BID  . Chlorhexidine Gluconate Cloth  6 each Topical Daily  . docusate sodium  200 mg Oral Daily  . feeding supplement (PROSource TF)  45 mL Per Tube BID  . insulin aspart  0-24 Units Subcutaneous Q4H  . pantoprazole  40 mg Oral Daily  . sodium chloride flush  3 mL Intravenous Q12H  . tacrolimus  1 mg Oral BID  . venlafaxine XR  37.5 mg Oral Daily   Continuous Infusions: .  prismasol BGK 4/2.5 400  mL/hr at 12/26/19 1049  . sodium chloride Stopped (12/21/19 1602)  . dexmedetomidine (PRECEDEX) IV infusion Stopped (12/26/19 0947)  . feeding supplement (VITAL 1.5 CAL) 1,000 mL (12/26/19 0928)  . lactated ringers Stopped (12/19/19 1448)  . lactated ringers Stopped (12/20/19 1152)  . milrinone 0.25 mcg/kg/min (12/26/19 1100)  . prismasol BGK 2/2.5 replacement solution 300 mL/hr at 12/26/19 0958  . prismasol BGK 4/2.5 1,800 mL/hr at 12/26/19 0916  . sodium phosphate  Dextrose 5% IVPB 43 mL/hr at 12/26/19 1100   PRN Meds:.Gerhardt's butt cream, heparin, insulin aspart, LORazepam, metoprolol tartrate, ondansetron (ZOFRAN) IV, oxyCODONE, senna-docusate, sodium chloride, sodium chloride flush  CBC Recent Labs  Lab 12/24/19  1388 12/25/19 0429 12/26/19 0347  WBC 13.9* 11.3* 9.6  HGB 8.6* 9.1* 8.6*  HCT 27.4* 28.9* 27.7*  MCV 83.8 84.0 84.2  PLT 156 168 719*   Basic Metabolic Panel Recent Labs  Lab 12/23/19 0436 12/23/19 1544 12/24/19 0433 12/24/19 1627 12/25/19 0429 12/25/19 1604 12/26/19 0340  NA 135 134* 135 136 133* 133* 135  K 5.2* 4.8 4.8 4.4 4.4 4.0 4.3  CL 101 99 101 100 99 99 101  CO2 21* _0 GLUCOSE 162* 240* 235* 190* 223* 220* 206*  BUN 34* 29* 30* 30* 27* 27* 23  CREATININE 4.07* 3.24* 3.01* 2.61* 2.54* 2.48* 2.16*  CALCIUM 8.0* 7.8* 8.0* 7.9* 7.9* 7.6* 7.5*  PHOS 5.1* 3.8 2.9 2.2* 1.5* 1.2* <1.0*    Physical Exam  Blood pressure (!) 101/62, pulse 80, temperature (!) 97.4 F (36.3 C), resp. rate 15, height _1  (1.778 m), weight 84.4 kg, SpO2 98 %. GEN: NAD, awake Neck: LIJ temp dialysis catheter: c/d/i CV: rrr, normal S1-S2 PULM: Diminished air entry left greater than right base (improving), no overt w/r/r/c, unlabored, bilateral chest expansion ABD: Soft, nontender nondistended SKIN: No rashes or lesions , midchest wall incision c/d/i EXT: trace edema bilateral le's NEURO: Speech clear and coherent, awake, following  commands   Assessment 49M with oliguric AoCKD3 in setting of LHF 7/9 and 5V CABG 7/16 + MAZE.  Suspect this is ATN following cardiac surgery  1. AoCKD3 (now anuric), BL SCr 1.2 to 1.5, no outpt nephrology; suspect ATN; exhibiting early uremic symptoms with loss of appetite along with nausea/vomiting (Cr continues to rise). No response in urine output with diuretics initially. Started CRRT on 12/22/2019 for uremic symptoms and volume. 2. NSTEMI status post 5 vessel CABG 7/16 3. Atrial fibrillation status post maze with left atrial appendage clipping 7/16.  On amiodarone 4. Acute on chronic sCHF/cardiogenic shock on epinephrine and milrinone 5. Status post liver transplant 2009 (HCV) on tacrolimus followed by [facility] 6. Hypertension 7. DM2 8. Delirium, critical care delirium?  Plan 1. Continue with CRRT, will not restart if filter clots or expires. k better with postfilter 2k bath. Maintaining net neg 50cc/hr 2. Will reassess intermittent hemodialysis needs moving forward once off crrt (provided he is hemodynamically stable) 3. Will continue to monitor for renal recovery moving forward 4. Daily weights, Strict I/Os, Avoid nephrotoxins (NSAIDs, judicious IV Contrast)   Joseph Quint, MD [facility] 12/26/2019, 11:46 AM

## 2019-12-26 NOTE — Discharge Summary (Signed)
Physician Discharge Summary  Patient ID: GARIK DIAMANT MRN: [id number] DOB/AGE: 1955-09-21 64 y.o.  Admit date: 12/11/2019 Discharge date: 01/01/2020  Admission Diagnoses:  Discharge Diagnoses:  Active Problems:   NSTEMI (non-ST elevated myocardial infarction) (HCC)   S/P CABG x 5   Malnutrition of moderate degree   Patient Active Problem List   Diagnosis Date Noted  . Malnutrition of moderate degree 12/22/2019  . S/P CABG x 5 12/19/2019  . NSTEMI (non-ST elevated myocardial infarction) ([place]) 12/11/2019  . Hx of colonic polyps 01/31/2017  . Chest pain, unspecified 04/09/2013  . Chest pain 03/26/2013  . HTN (hypertension) 03/26/2013  . H/O liver transplant ([place]) 09/04/2011  . Psoriasis 09/04/2011  . Vertigo 09/04/2011   HPI: Patient is a 64 year old male who was transferred to [facility] after originally presenting to the emergency department at [facility] on 12/09/2019 where he was complaining of symptoms of increasing shortness of breath and fatigue.  The symptoms originally started approximately 7 to 8 days prior but they increased and he noted decrease in exercise tolerance.  He normally swims laps in the pool and and was not able to do anywhere near his normal amount due to fatigue and shortness of breath.  He denied any associated chest tightness with exertion and had a "full feeling" sensation in his stomach and chest at times.  Initial EKG was unremarkable with notes indicating EKG on 12/09/2019 showed normal sinus rhythm with LVH without ischemic changes.  Troponins were found to be elevated.  He was started on intravenous heparin and was also noted to go into atrial fibrillation with RVR.  He was transferred to [facility] for further evaluation to include cardiac catheterization.   Discharged Condition: good  Hospital Course: Patient was transferred to [facility] for further evaluation treatment to include cardiac catheterization.  He was seen in  cardiology consultation by Dr. Marlou Porch.  He was medically stabilized and on 12/12/2019 he was taken for cardiac catheterization.  Please see the full report listed below.  Due to the findings of severe multivessel coronary artery disease cardiothoracic surgical consultation was obtained with Dr. Kipp Brood.  Due to his significantly reduced ejection fraction, RV dysfunction, chronic kidney injury with creatinine of 1.85 as well as mild to moderate mitral valve regurgitation it was felt that he should be further evaluated by both the advanced heart failure team and also the case was discussed with Dr. Orvan Seen.  Echocardiogram done 12/12/2018 confirmed the left ventricular ejection fraction to be severely depressed with severe hypokinesis of all segments except the septum.  Left ventricular ejection fraction by estimation was 20 to 25%.  Right ventricular systolic function was also moderately reduced.  He is felt to have mild to moderate mitral valve regurgitation.  A cardiac MRI was also felt to be required for further assistance in the decision-making process.  Additionally he was felt to require a right heart catheterization which was done on 7/14 with the results as described below.  Ultimately it was determined that he would be a high risk candidate but was recommended to proceed with surgical intervention.  On 12/19/2019 he was taken the operating room where he underwent biatrial maze procedure with radiofrequency ablation and cryoablation.  He also underwent closure of patent foramen ovale.  Additionally CABG x5.  He tolerated procedure well was taken to the surgical intensive care unit in stable condition.  Postoperative hospital course:  The patient has had a complex postoperative course.  The advanced heart failure  team has assisted with management throughout as has the PCCM team.  He did require multiple vasoactive infusions with these have been gradually weaned off over several days.  He was treated with  amiodarone atrial fibrillation subsequently converted to sinus rhythm.  He developed significant renal insufficiency requiring nephrology consultation as well as CRRT and subsequent transition to hemodialysis.  He did have an episode of significant confusion and acidosis which was felt to possibly related to his tacrolimus levels have been gradually resumed after stopping for a period of time.  Currently they are at half baseline level.  Heart failure pharmacy has been coordinating with the transplant center for these dosing adjustments.  A postoperative limited echo shows some improvement in the LVEF to 35-40% with normal RV.  During the postoperative.  He was also treated empirically with intravenous antibiotics for possible hospital-acquired pneumonia with vancomycin and cefepime due to findings on chest x-ray as well as increasing leukocytosis.  This is gradually showing steady improvement in the antibiotics have been subsequently discontinued.  He was originally felt to be a candidate for CIR but has made continued improvement since overall physical rehabilitation is felt that he is no longer a candidate for this.  We will make arrangements for home physical and occupational therapy.  Additionally we will make arrangements for outpatient dialysis.  The kidney team navigator is assisting with this.  He had a dialysis catheter placed on 12/30/2019.  His incisions are noted to be healing well without evidence of infection.  He is tolerating diet.  Oxygen has been weaned and he maintains good saturations on room air.  He has been weaned off of all pressors/inotopes.  At the time of discharge the patient is felt to be quite stable.  Consults: cardiology, pulmonary/intensive care and nephrology  Significant Diagnostic Studies: routine post op labs and serial CXR's   ECHOCARDIOGRAM REPORT       Patient Name:  JAESHAWN SILVIO Date of Exam: 12/12/2019  Medical Rec #: [id number]      Height:     70.0 in  Accession #:  [id number]      Weight:    186.2 lb  Date of Birth: [birth year]      BSA:     2.025 m  Patient Age:  15 years       BP:      114/80 mmHg  Patient Gender: M          HR:      83 bpm.  Exam Location: Inpatient   Procedure: 2D Echo and Intracardiac Opacification Agent   Indications:  NSTEMI I21.4    History:    Patient has prior history of Echocardiogram examinations,  most         recent 03/27/2013. Risk Factors:Hypertension and Diabetes.    Sonographer:  Mikki Santee RDCS (AE)  Referring Phys: [place]    1. LVEF is depressed with severe hypokinesis in all segments except  septum. . Left ventricular ejection fraction, by estimation, is 20 to 25%.  The left ventricle has severely decreased function. The left ventricular  internal cavity size was severely  dilated. Left ventricular diastolic parameters are indeterminate.  2. Right ventricular systolic function is moderately reduced. The right  ventricular size is normal.  3. Left atrial size was mild to moderately dilated.  4. Turbulent flow along intraatrial septum suspicious for PFO . Evidence  of atrial level shunting detected by color flow Doppler.  5. The  mitral valve is abnormal. Mild to moderate mitral valve  regurgitation.  6. The aortic valve is normal in structure. Aortic valve regurgitation is  trivial.   FINDINGS  Left Ventricle: LVEF is depressed with severe hypokinesis in all segments  except septum. Left ventricular ejection fraction, by estimation, is 20 to  25%. The left ventricle has severely decreased function. The left  ventricle demonstrates regional wall  motion abnormalities. Definity contrast agent was given IV to delineate  the left ventricular endocardial borders. The left ventricular internal  cavity size was severely dilated. There is no left ventricular  hypertrophy.  Left ventricular diastolic  parameters are indeterminate.   Right Ventricle: The right ventricular size is normal. No increase in  right ventricular wall thickness. Right ventricular systolic function is  moderately reduced.   Left Atrium: Left atrial size was mild to moderately dilated.   Right Atrium: Right atrial size was normal in size.   Pericardium: There is no evidence of pericardial effusion.   Mitral Valve: The mitral valve is abnormal. There is mild thickening of  the mitral valve leaflet(s). Mild to moderate mitral annular  calcification. Mild to moderate mitral valve regurgitation.   Tricuspid Valve: The tricuspid valve is normal in structure. Tricuspid  valve regurgitation is trivial.   Aortic Valve: The aortic valve is normal in structure. Aortic valve  regurgitation is trivial.   Pulmonic Valve: The pulmonic valve was normal in structure. Pulmonic valve  regurgitation is not visualized.   Aorta: The aortic root was not well visualized.   IAS/Shunts: Evidence of atrial level shunting detected by color flow  Doppler.     LEFT VENTRICLE  PLAX 2D  LVIDd:     6.30 cm  LVIDs:     5.70 cm  LV PW:     1.00 cm  LV IVS:    1.00 cm  LVOT diam:   2.50 cm  LV SV:     62  LV SV Index:  31  LVOT Area:   4.91 cm    LV Volumes (MOD)  LV vol d, MOD A2C: 131.0 ml  LV vol d, MOD A4C: 211.0 ml  LV vol s, MOD A2C: 90.3 ml  LV vol s, MOD A4C: 162.0 ml  LV SV MOD A2C:   40.7 ml  LV SV MOD A4C:   211.0 ml  LV SV MOD BP:   49.2 ml   RIGHT VENTRICLE  RV S prime:   9.90 cm/s  TAPSE (M-mode): 1.1 cm   LEFT ATRIUM       Index    RIGHT ATRIUM      Index  LA diam:    5.20 cm 2.57 cm/m RA Area:   14.60 cm  LA Vol (A2C):  110.0 ml 54.32 ml/m RA Volume:  32.50 ml 16.05 ml/m  LA Vol (A4C):  83.7 ml 41.34 ml/m  LA Biplane Vol: 96.7 ml 47.76 ml/m  AORTIC VALVE  LVOT Vmax:  59.60 cm/s  LVOT Vmean: 44.400  cm/s  LVOT VTI:  0.126 m    AORTA  Ao Root diam: 3.60 cm   MITRAL VALVE  MV Area (PHT): 6.32 cm  SHUNTS  MV Decel Time: 120 msec  Systemic VTI: 0.13 m  MR Peak grad: 52.4 mmHg  Systemic Diam: 2.50 cm  MR Mean grad: 36.0 mmHg  MR Vmax:   362.00 cm/s  MR Vmean:   287.0 cm/s  MV E velocity: 86.80 cm/s   Dorris Carnes MD  Electronically signed  by Dorris Carnes MD  Signature Date/Time: 12/12/2019/11:46:49 AM      Final    LEFT HEART CATH AND CORONARY ANGIOGRAPHY  Conclusion    Dist RCA lesion is 100% stenosed. Faint left to right and right to right collaterals.  Ramus lesion is 95% stenosed.  1st Diag lesion is 75% stenosed.  Prox Cx lesion is 99% stenosed. THis is the culprit lesion. TIMI 2 flow.  Ost LAD to Prox LAD lesion is 60% stenosed. Complex lesion with shelf of plaque.  Mid LAD lesion is 80% stenosed.  LV end diastolic pressure is moderately elevated.  There is no aortic valve stenosis.   Plan for cardiac surgery consult.   Surgeon Notes    12/22/2019 10:26 PM Op Note signed by Wonda Olds, MD    12/19/2019 2:56 PM Brief Op Note signed by Wonda Olds, MD    12/19/2019 2:46 PM Operative Note - Scan signed by Default, Provider, MD  Indications  Non-ST elevation (NSTEMI) myocardial infarction ([place]) [I21.4 (ICD-10-CM)]  Procedural Details  Technical Details The risks, benefits, and details of the procedure were explained to the patient.  The patient verbalized understanding and wanted to proceed.  Informed written consent was obtained.  PROCEDURE TECHNIQUE:  After Xylocaine anesthesia a 70F slender sheath was placed in the right radial artery with a single anterior needle wall stick using ultrasound guidance.   IV Heparin was given.    Left coronary angiography was done using a Judkins L3.5 guide catheter.  Left heart cath was done using a JR4 catheter. Right coronary angiography was done using an AR1 guide catheter. A TR band was  used for hemostasis.  Contrast: 75 cc  Estimated blood loss <50 mL.   During this procedure medications were administered to achieve and maintain moderate conscious sedation while the patient's heart rate, blood pressure, and oxygen saturation were continuously monitored and I was present face-to-face 100% of this time.  Medications (Filter: Administrations occurring from 1157 to 1307 on 12/12/19) (important) Continuous medications are totaled by the amount administered until 12/12/19 1307.  midazolam (VERSED) injection (mg) Total dose:  2 mg  Date/Time  Rate/Dose/Volume Action  12/12/19 1224  2 mg Given    fentaNYL (SUBLIMAZE) injection (mcg) Total dose:  25 mcg  Date/Time  Rate/Dose/Volume Action  12/12/19 1225  25 mcg Given    lidocaine (PF) (XYLOCAINE) 1 % injection (mL) Total volume:  2 mL  Date/Time  Rate/Dose/Volume Action  12/12/19 1227  2 mL Given    Radial Cocktail/Verapamil only (mL) Total volume:  10 mL  Date/Time  Rate/Dose/Volume Action  12/12/19 1230  10 mL Given    heparin sodium (porcine) injection (Units) Total dose:  4,000 Units  Date/Time  Rate/Dose/Volume Action  12/12/19 1233  4,000 Units Given    iohexol (OMNIPAQUE) 350 MG/ML injection (mL) Total volume:  75 mL  Date/Time  Rate/Dose/Volume Action  12/12/19 1257  75 mL Given    Heparin (Porcine) in NaCl 1000-0.9 UT/500ML-% SOLN (mL) Total volume:  1,000 mL  Date/Time  Rate/Dose/Volume Action  12/12/19 1257  500 mL Given  1257  500 mL Given    off the beat book Total dose:  Cannot be calculated* Dosing weight:  84.1  *Administration dose not documented Date/Time  Rate/Dose/Volume Action  12/12/19 1157  *Not included in total MAR Hold    acetaminophen (TYLENOL) tablet 650 mg (mg) Total dose:  Cannot be calculated* Dosing weight:  84.1  *Administration dose not documented Date/Time  Rate/Dose/Volume Action  12/12/19 1157  *Not included in total MAR Hold    ALPRAZolam (XANAX)  tablet 0.25 mg (mg) Total dose:  Cannot be calculated* Dosing weight:  84.1  *Administration dose not documented Date/Time  Rate/Dose/Volume Action  12/12/19 1157  *Not included in total MAR Hold    Apremilast TABS 30 mg (mg) Total dose:  Cannot be calculated* Dosing weight:  84.1  *Administration dose not documented Date/Time  Rate/Dose/Volume Action  12/12/19 1157  *Not included in total MAR Hold    aspirin EC tablet 81 mg (mg) Total dose:  Cannot be calculated* Dosing weight:  84.1  *Administration dose not documented Date/Time  Rate/Dose/Volume Action  12/12/19 1157  *Not included in total MAR Hold    atorvastatin (LIPITOR) tablet 80 mg (mg) Total dose:  Cannot be calculated* Dosing weight:  84.1  *Administration dose not documented Date/Time  Rate/Dose/Volume Action  12/12/19 1157  *Not included in total MAR Hold    diltiazem (CARDIZEM) 125 mg in dextrose 5% 125 mL (1 mg/mL) infusion (mg/hr) Total dose:  Cannot be calculated* Dosing weight:  84.1  *Administration dose not documented Date/Time  Rate/Dose/Volume Action  12/12/19 1157  *Not included in total MAR Hold    metoprolol tartrate (LOPRESSOR) tablet 25 mg (mg) Total dose:  Cannot be calculated* Dosing weight:  84.1  *Administration dose not documented Date/Time  Rate/Dose/Volume Action  12/12/19 1157  *Not included in total MAR Hold    nitroGLYCERIN (NITROSTAT) SL tablet 0.4 mg (mg) Total dose:  Cannot be calculated* Dosing weight:  84.1  *Administration dose not documented Date/Time  Rate/Dose/Volume Action  12/12/19 1157  *Not included in total MAR Hold    ondansetron (ZOFRAN) injection 4 mg (mg) Total dose:  Cannot be calculated* Dosing weight:  84.1  *Administration dose not documented Date/Time  Rate/Dose/Volume Action  12/12/19 1157  *Not included in total MAR Hold    tacrolimus (PROGRAF) capsule 2 mg (mg) Total dose:  Cannot be calculated* Dosing weight:  84.1  *Administration dose not  documented Date/Time  Rate/Dose/Volume Action  12/12/19 1157  *Not included in total MAR Hold    venlafaxine XR (EFFEXOR-XR) 24 hr capsule 75 mg (mg) Total dose:  Cannot be calculated* Dosing weight:  84.1  *Administration dose not documented Date/Time  Rate/Dose/Volume Action  12/12/19 1157  *Not included in total MAR Hold    zolpidem (AMBIEN) tablet 5 mg (mg) Total dose:  Cannot be calculated* Dosing weight:  84.1  *Administration dose not documented Date/Time  Rate/Dose/Volume Action  12/12/19 1157  *Not included in total MAR Hold    Sedation Time  Sedation Time Physician-1: 31 minutes 22 seconds  Contrast  Medication Name Total Dose  iohexol (OMNIPAQUE) 350 MG/ML injection 75 mL    Radiation/Fluoro  Fluoro time: 10.7 (min) DAP: 21803 (mGycm2) Cumulative Air Kerma: 694 (mGy)  Complications  Complications documented before study signed (12/12/2019 8:54 PM)   No complications were associated with this study.  Documented by Jettie Booze, MD - 12/12/2019 1:20 PM    Coronary Findings  Diagnostic Dominance: Right Left Anterior Descending  Ost LAD to Prox LAD lesion is 60% stenosed. The lesion is eccentric. shelf of plaque noted in teh proximal LAD.  Mid LAD lesion is 80% stenosed.  First Diagonal Branch  1st Diag lesion is 75% stenosed.  Ramus Intermedius  Ramus lesion is 95% stenosed.  Left Circumflex  Prox Cx lesion is 99% stenosed.  Right Coronary Artery  There is moderate diffuse disease  throughout the vessel.  Dist RCA lesion is 100% stenosed.  Intervention  No interventions have been documented. Left Heart  Left Ventricle LV end diastolic pressure is moderately elevated.  Aortic Valve There is no aortic valve stenosis.  Coronary Diagrams  Diagnostic Dominance: Right  Intervention   RIGHT HEART CATH  Conclusion  1. Elevated PCWP >> RA pressure.  2. Pulmonary venous hypertension.  3. Preserved cardiac output.  Surgeon Notes     12/22/2019 10:26 PM Op Note signed by Wonda Olds, MD    12/19/2019 2:56 PM Brief Op Note signed by Wonda Olds, MD    12/19/2019 2:46 PM Operative Note - Scan signed by Default, Provider, MD  Procedural Details  Technical Details Procedure: Right Heart Cath  Indication: CHF, ischemic cardiomyopathy.    Procedural Details: The left brachial area was prepped, draped, and anesthetized with 1% lidocaine. There was a pre-existing peripheral IV that was replaced with a 6/7 slender sheath. A Swan-Ganz catheter was used for the right heart catheterization. Standard protocol was followed for recording of right heart pressures and sampling of oxygen saturations. Fick and thermodilution cardiac output was calculated.  There were no immediate procedural complications. The patient was transferred to the post catheterization recovery area for further monitoring.   Estimated blood loss <50 mL.   During this procedure no sedation was administered.  Medications (Filter: Administrations occurring from 0826 to 0926 on 12/17/19) (important) Continuous medications are totaled by the amount administered until 12/17/19 0926.  Heparin (Porcine) in NaCl 1000-0.9 UT/500ML-% SOLN (mL) Total volume:  500 mL  Date/Time  Rate/Dose/Volume Action  12/17/19 0845  500 mL Given    lidocaine (PF) (XYLOCAINE) 1 % injection (mL) Total volume:  2 mL  Date/Time  Rate/Dose/Volume Action  12/17/19 0904  2 mL Given    0.9 % sodium chloride infusion (mL) Total dose:  Cannot be calculated* Dosing weight:  84.5  *Administration dose not documented Date/Time  Rate/Dose/Volume Action  12/17/19 0826  *Not included in total MAR Hold    acetaminophen (TYLENOL) tablet 650 mg (mg) Total dose:  Cannot be calculated* Dosing weight:  84.5  *Administration dose not documented Date/Time  Rate/Dose/Volume Action  12/17/19 0826  *Not included in total MAR Hold    ALPRAZolam (XANAX) tablet 0.25 mg (mg) Total dose:   Cannot be calculated* Dosing weight:  84.1  *Administration dose not documented Date/Time  Rate/Dose/Volume Action  12/17/19 0826  *Not included in total MAR Hold    amiodarone (PACERONE) tablet 200 mg (mg) Total dose:  Cannot be calculated* Dosing weight:  86.3  *Administration dose not documented Date/Time  Rate/Dose/Volume Action  12/17/19 0826  *Not included in total MAR Hold    Apremilast TABS 30 mg (mg) Total dose:  Cannot be calculated* Dosing weight:  84.1  *Administration dose not documented Date/Time  Rate/Dose/Volume Action  12/17/19 0826  *Not included in total MAR Hold    aspirin EC tablet 81 mg (mg) Total dose:  Cannot be calculated* Dosing weight:  84.1  *Administration dose not documented Date/Time  Rate/Dose/Volume Action  12/17/19 0826  *Not included in total MAR Hold    atorvastatin (LIPITOR) tablet 80 mg (mg) Total dose:  Cannot be calculated* Dosing weight:  84.1  *Administration dose not documented Date/Time  Rate/Dose/Volume Action  12/17/19 0826  *Not included in total MAR Hold    Chlorhexidine Gluconate Cloth 2 % PADS 6 each (each) Total dose:  Cannot be calculated* Dosing weight:  86.3  *Administration  dose not documented Date/Time  Rate/Dose/Volume Action  12/17/19 0826  *Not included in total MAR Hold    metoprolol succinate (TOPROL-XL) 24 hr tablet 25 mg (mg) Total dose:  Cannot be calculated* Dosing weight:  86.5  *Administration dose not documented Date/Time  Rate/Dose/Volume Action  12/17/19 0826  *Not included in total MAR Hold    mupirocin cream (BACTROBAN) 2 % Total dose:  Cannot be calculated* Dosing weight:  86.5  *Administration dose not documented Date/Time  Rate/Dose/Volume Action  12/17/19 0826  *Not included in total MAR Hold    nitroGLYCERIN (NITROSTAT) SL tablet 0.4 mg (mg) Total dose:  Cannot be calculated* Dosing weight:  84.1  *Administration dose not documented Date/Time  Rate/Dose/Volume Action  12/17/19 0826   *Not included in total MAR Hold    ondansetron (ZOFRAN) injection 4 mg (mg) Total dose:  Cannot be calculated* Dosing weight:  84.5  *Administration dose not documented Date/Time  Rate/Dose/Volume Action  12/17/19 0826  *Not included in total MAR Hold    sodium chloride flush (NS) 0.9 % injection 10-40 mL (mL) Total dose:  Cannot be calculated* Dosing weight:  86.3  *Administration dose not documented Date/Time  Rate/Dose/Volume Action  12/17/19 0826  *Not included in total MAR Hold    sodium chloride flush (NS) 0.9 % injection 10-40 mL (mL) Total dose:  Cannot be calculated* Dosing weight:  86.3  *Administration dose not documented Date/Time  Rate/Dose/Volume Action  12/17/19 0826  *Not included in total MAR Hold    sodium chloride flush (NS) 0.9 % injection 3 mL (mL) Total dose:  Cannot be calculated* Dosing weight:  84.5  *Administration dose not documented Date/Time  Rate/Dose/Volume Action  12/17/19 0826  *Not included in total MAR Hold    sodium chloride flush (NS) 0.9 % injection 3 mL (mL) Total dose:  Cannot be calculated* Dosing weight:  84.5  *Administration dose not documented Date/Time  Rate/Dose/Volume Action  12/17/19 0826  *Not included in total MAR Hold    sodium chloride flush (NS) 0.9 % injection 3 mL (mL) Total dose:  Cannot be calculated* Dosing weight:  86.5  *Administration dose not documented Date/Time  Rate/Dose/Volume Action  12/17/19 0826  *Not included in total MAR Hold    tacrolimus (PROGRAF) capsule 2 mg (mg) Total dose:  Cannot be calculated* Dosing weight:  84.1  *Administration dose not documented Date/Time  Rate/Dose/Volume Action  12/17/19 0826  *Not included in total MAR Hold    venlafaxine XR (EFFEXOR-XR) 24 hr capsule 75 mg (mg) Total dose:  Cannot be calculated* Dosing weight:  84.1  *Administration dose not documented Date/Time  Rate/Dose/Volume Action  12/17/19 0826  *Not included in total MAR Hold    zolpidem (AMBIEN)  tablet 5 mg (mg) Total dose:  Cannot be calculated* Dosing weight:  84.1  *Administration dose not documented Date/Time  Rate/Dose/Volume Action  12/17/19 0826  *Not included in total MAR Hold    Radiation/Fluoro  Fluoro time: 1 (min) DAP: 923 (mGycm2) Cumulative Air Kerma: 9 (mGy)  Right Heart  Right Heart Pressures RHC Procedural Findings: Hemodynamics (mmHg) RA mean 8 RV 55/11 PA 60/30, mean 44 PCWP mean 36  Oxygen saturations: PA 62% AO 97%  Cardiac Output (Fick) 5.3  Cardiac Index (Fick) 2.61  Cardiac Output (Thermo) 5.11 Cardiac Index (Thermo) 2.52  PVR 1.6 WU  Implants   No implant documentation for this case.  Syngo Images  Show images for CARDIAC CATHETERIZATION Images on Long Term Storage  Show images for  [place], Skare to Procedure Log  Procedure Log    Hemo Data   Most Recent Value  Fick Cardiac Output 5.3 L/min  Fick Cardiac Output Index 2.61 (L/min)/BSA  Thermal Cardiac Output 5.11 L/min  Thermal Cardiac Output Index 2.52 (L/min)/BSA  RA A Wave 11 mmHg  RA V Wave 9 mmHg  RA Mean 8 mmHg  RV Systolic Pressure 56 mmHg  RV Diastolic Pressure 8 mmHg  RV EDP 11 mmHg  PA Systolic Pressure 60 mmHg  PA Diastolic Pressure 30 mmHg  PA Mean 44 mmHg  PW A Wave 35 mmHg  PW V Wave 41 mmHg  PW Mean 36 mmHg  AO Systolic Pressure 341 mmHg  AO Diastolic Pressure 68 mmHg  AO Mean 88 mmHg  LV Systolic Pressure 937 mmHg  LV Diastolic Pressure 17 mmHg  LV EDP 21 mmHg  AOp Systolic Pressure 902 mmHg  AOp Diastolic Pressure 59 mmHg  AOp Mean Pressure 81 mmHg  LVp Systolic Pressure 409 mmHg  LVp Diastolic Pressure 19 mmHg  LVp EDP Pressure 28 mmHg  TPVR Index 17.48 HRUI  Encounter-Level Documents - 12/11/2019:     RIGHT HEART CATH  Conclusion  1. Elevated PCWP >> RA pressure.  2. Pulmonary venous hypertension.  3. Preserved cardiac output.  Surgeon Notes    12/22/2019 10:26 PM Op Note signed by Wonda Olds, MD     12/19/2019 2:56 PM Brief Op Note signed by Wonda Olds, MD    12/19/2019 2:46 PM Operative Note - Scan signed by Default, Provider, MD  Procedural Details  Technical Details Procedure: Right Heart Cath  Indication: CHF, ischemic cardiomyopathy.    Procedural Details: The left brachial area was prepped, draped, and anesthetized with 1% lidocaine. There was a pre-existing peripheral IV that was replaced with a 6/7 slender sheath. A Swan-Ganz catheter was used for the right heart catheterization. Standard protocol was followed for recording of right heart pressures and sampling of oxygen saturations. Fick and thermodilution cardiac output was calculated.  There were no immediate procedural complications. The patient was transferred to the post catheterization recovery area for further monitoring.   Estimated blood loss <50 mL.   During this procedure no sedation was administered.  Medications (Filter: Administrations occurring from 0826 to 0926 on 12/17/19) (important) Continuous medications are totaled by the amount administered until 12/17/19 0926.  Heparin (Porcine) in NaCl 1000-0.9 UT/500ML-% SOLN (mL) Total volume:  500 mL  Date/Time  Rate/Dose/Volume Action  12/17/19 0845  500 mL Given    lidocaine (PF) (XYLOCAINE) 1 % injection (mL) Total volume:  2 mL  Date/Time  Rate/Dose/Volume Action  12/17/19 0904  2 mL Given    0.9 % sodium chloride infusion (mL) Total dose:  Cannot be calculated* Dosing weight:  84.5  *Administration dose not documented Date/Time  Rate/Dose/Volume Action  12/17/19 0826  *Not included in total MAR Hold    acetaminophen (TYLENOL) tablet 650 mg (mg) Total dose:  Cannot be calculated* Dosing weight:  84.5  *Administration dose not documented Date/Time  Rate/Dose/Volume Action  12/17/19 0826  *Not included in total MAR Hold    ALPRAZolam (XANAX) tablet 0.25 mg (mg) Total dose:  Cannot be calculated* Dosing weight:  84.1  *Administration  dose not documented Date/Time  Rate/Dose/Volume Action  12/17/19 0826  *Not included in total MAR Hold    amiodarone (PACERONE) tablet 200 mg (mg) Total dose:  Cannot be calculated* Dosing weight:  86.3  *Administration dose not documented Date/Time  Rate/Dose/Volume  Action  12/17/19 0826  *Not included in total MAR Hold    Apremilast TABS 30 mg (mg) Total dose:  Cannot be calculated* Dosing weight:  84.1  *Administration dose not documented Date/Time  Rate/Dose/Volume Action  12/17/19 0826  *Not included in total MAR Hold    aspirin EC tablet 81 mg (mg) Total dose:  Cannot be calculated* Dosing weight:  84.1  *Administration dose not documented Date/Time  Rate/Dose/Volume Action  12/17/19 0826  *Not included in total MAR Hold    atorvastatin (LIPITOR) tablet 80 mg (mg) Total dose:  Cannot be calculated* Dosing weight:  84.1  *Administration dose not documented Date/Time  Rate/Dose/Volume Action  12/17/19 0826  *Not included in total MAR Hold    Chlorhexidine Gluconate Cloth 2 % PADS 6 each (each) Total dose:  Cannot be calculated* Dosing weight:  86.3  *Administration dose not documented Date/Time  Rate/Dose/Volume Action  12/17/19 0826  *Not included in total MAR Hold    metoprolol succinate (TOPROL-XL) 24 hr tablet 25 mg (mg) Total dose:  Cannot be calculated* Dosing weight:  86.5  *Administration dose not documented Date/Time  Rate/Dose/Volume Action  12/17/19 0826  *Not included in total MAR Hold    mupirocin cream (BACTROBAN) 2 % Total dose:  Cannot be calculated* Dosing weight:  86.5  *Administration dose not documented Date/Time  Rate/Dose/Volume Action  12/17/19 0826  *Not included in total MAR Hold    nitroGLYCERIN (NITROSTAT) SL tablet 0.4 mg (mg) Total dose:  Cannot be calculated* Dosing weight:  84.1  *Administration dose not documented Date/Time  Rate/Dose/Volume Action  12/17/19 0826  *Not included in total MAR Hold    ondansetron (ZOFRAN)  injection 4 mg (mg) Total dose:  Cannot be calculated* Dosing weight:  84.5  *Administration dose not documented Date/Time  Rate/Dose/Volume Action  12/17/19 0826  *Not included in total MAR Hold    sodium chloride flush (NS) 0.9 % injection 10-40 mL (mL) Total dose:  Cannot be calculated* Dosing weight:  86.3  *Administration dose not documented Date/Time  Rate/Dose/Volume Action  12/17/19 0826  *Not included in total MAR Hold    sodium chloride flush (NS) 0.9 % injection 10-40 mL (mL) Total dose:  Cannot be calculated* Dosing weight:  86.3  *Administration dose not documented Date/Time  Rate/Dose/Volume Action  12/17/19 0826  *Not included in total MAR Hold    sodium chloride flush (NS) 0.9 % injection 3 mL (mL) Total dose:  Cannot be calculated* Dosing weight:  84.5  *Administration dose not documented Date/Time  Rate/Dose/Volume Action  12/17/19 0826  *Not included in total MAR Hold    sodium chloride flush (NS) 0.9 % injection 3 mL (mL) Total dose:  Cannot be calculated* Dosing weight:  84.5  *Administration dose not documented Date/Time  Rate/Dose/Volume Action  12/17/19 0826  *Not included in total MAR Hold    sodium chloride flush (NS) 0.9 % injection 3 mL (mL) Total dose:  Cannot be calculated* Dosing weight:  86.5  *Administration dose not documented Date/Time  Rate/Dose/Volume Action  12/17/19 0826  *Not included in total MAR Hold    tacrolimus (PROGRAF) capsule 2 mg (mg) Total dose:  Cannot be calculated* Dosing weight:  84.1  *Administration dose not documented Date/Time  Rate/Dose/Volume Action  12/17/19 0826  *Not included in total MAR Hold    venlafaxine XR (EFFEXOR-XR) 24 hr capsule 75 mg (mg) Total dose:  Cannot be calculated* Dosing weight:  84.1  *Administration dose not documented Date/Time  Rate/Dose/Volume Action  12/17/19 0826  *Not included in total MAR Hold    zolpidem (AMBIEN) tablet 5 mg (mg) Total dose:  Cannot be calculated*  Dosing weight:  84.1  *Administration dose not documented Date/Time  Rate/Dose/Volume Action  12/17/19 0826  *Not included in total MAR Hold    Radiation/Fluoro  Fluoro time: 1 (min) DAP: 923 (mGycm2) Cumulative Air Kerma: 9 (mGy)  Right Heart  Right Heart Pressures RHC Procedural Findings: Hemodynamics (mmHg) RA mean 8 RV 55/11 PA 60/30, mean 44 PCWP mean 36  Oxygen saturations: PA 62% AO 97%  Cardiac Output (Fick) 5.3  Cardiac Index (Fick) 2.61  Cardiac Output (Thermo) 5.11 Cardiac Index (Thermo) 2.52  PVR 1.6 WU  Implants   No implant documentation for this case.  Syngo Images  Show images for CARDIAC CATHETERIZATION Images on Long Term Storage  Show images for Mohamedamin, Nifong to Procedure Log  Procedure Log    Hemo Data   Most Recent Value  Fick Cardiac Output 5.3 L/min  Fick Cardiac Output Index 2.61 (L/min)/BSA  Thermal Cardiac Output 5.11 L/min  Thermal Cardiac Output Index 2.52 (L/min)/BSA  RA A Wave 11 mmHg  RA V Wave 9 mmHg  RA Mean 8 mmHg  RV Systolic Pressure 56 mmHg  RV Diastolic Pressure 8 mmHg  RV EDP 11 mmHg  PA Systolic Pressure 60 mmHg  PA Diastolic Pressure 30 mmHg  PA Mean 44 mmHg  PW A Wave 35 mmHg  PW V Wave 41 mmHg  PW Mean 36 mmHg  AO Systolic Pressure 161 mmHg  AO Diastolic Pressure 68 mmHg  AO Mean 88 mmHg  LV Systolic Pressure 096 mmHg  LV Diastolic Pressure 17 mmHg  LV EDP 21 mmHg  AOp Systolic Pressure 045 mmHg  AOp Diastolic Pressure 59 mmHg  AOp Mean Pressure 81 mmHg  LVp Systolic Pressure 409 mmHg  LVp Diastolic Pressure 19 mmHg  LVp EDP Pressure 28 mmHg  TPVR Index 17.48 HRUI  Encounter-Level Documents - 12/11/2019:     Treatments: surgery:  CARDIOTHORACIC SURGERY OPERATIVE NOTE  Date of Procedure:    12/19/19 Preoperative Diagnosis:      Severe 3-vessel Coronary Artery Disease; depressed left ventricular function, and paroxysmal atrial fibrillation  Postoperative Diagnosis:     Same  Procedure:        Biatrial maze procedure with radiofrequency ablation and cryo ablation  Closure of patent foramen ovale  Coronary Artery Bypass Grafting x 5             Left Internal Mammary Artery to Distal Left Anterior Descending Coronary Artery; Saphenous Vein Graft to right posterior Descending Coronary Artery and right posterior lateral artery as a sequenced graft; pedicled right internal mammary artery to ramus intermedius Branch of Left Circumflex Coronary Artery; Sapheonous Vein Graft to second diagonal Branch Coronary Artery Endoscopic Vein Harvest from right thigh and Lower Leg Bilateral internal mammary artery harvesting  Surgeon:        B.  Murvin Natal, MD  Assistant:       Jadene Pierini, PA-C  Anesthesia:    General  Operative Findings: ? Reduced left ventricular systolic function ? Good quality internal mammary artery conduits ? Good quality saphenous vein conduit ? Good quality target vessels for grafting Discharge Exam: Blood pressure (!) 104/61, pulse 73, temperature 97.9 F (36.6 C), temperature source Oral, resp. rate 16, height 5\' 10"  (1.778 m), weight 80.7 kg, SpO2 98 %.  General appearance: alert, cooperative and no distress Heart: regular rate and rhythm  Lungs: min dim in bases Abdomen: benign Extremities: no edema Wound: incis healing well Disposition: Discharge disposition: 01-Home or Self Care      Discharge Instructions    Discharge patient   Complete by: As directed    When all home care arrangements in place   Discharge disposition: 01-Home or Self Care   Discharge patient date: 01/01/2020     Allergies as of 01/01/2020   No Known Allergies     Medication List    STOP taking these medications   amLODipine 5 MG tablet Commonly known as: NORVASC   lisinopril 20 MG tablet Commonly known as: ZESTRIL   nitroGLYCERIN 0.4 MG SL tablet Commonly known as: NITROSTAT   triamcinolone ointment 0.1 % Commonly known as:  KENALOG     TAKE these medications   amiodarone 200 MG tablet Commonly known as: PACERONE Take 1 tablet (200 mg total) by mouth daily. Start taking on: January 02, 2020   apixaban 5 MG Tabs tablet Commonly known as: ELIQUIS Take 1 tablet (5 mg total) by mouth 2 (two) times daily.   aspirin 81 MG EC tablet Take 1 tablet (81 mg total) by mouth daily. Swallow whole. Start taking on: January 02, 2020   atorvastatin 80 MG tablet Commonly known as: LIPITOR Take 1 tablet (80 mg total) by mouth daily. Start taking on: January 02, 2020   carvedilol 3.125 MG tablet Commonly known as: COREG Take 1 tablet (3.125 mg total) by mouth 2 (two) times daily with a meal.   CellCept 250 MG capsule Generic drug: mycophenolate Take 250 mg by mouth 2 (two) times daily.   Darbepoetin Alfa 60 MCG/0.3ML Sosy injection Commonly known as: ARANESP Inject 0.3 mLs (60 mcg total) into the vein every Wednesday with hemodialysis. Start taking on: January 07, 2020   LORazepam 1 MG tablet Commonly known as: ATIVAN Take 1 mg by mouth 2 (two) times daily as needed (panic attacks).   multivitamin Tabs tablet Take 1 tablet by mouth at bedtime.   Otezla 30 MG Tabs Generic drug: Apremilast Take 1 tablet by mouth 2 (two) times daily.   pantoprazole 40 MG tablet Commonly known as: PROTONIX Take 1 tablet (40 mg total) by mouth daily. Start taking on: January 02, 2020   tacrolimus 1 MG capsule Commonly known as: PROGRAF Take 1 capsule (1 mg total) by mouth 2 (two) times daily. What changed: how much to take   traMADol 50 MG tablet Commonly known as: ULTRAM Take 1 tablet (50 mg total) by mouth every 6 (six) hours as needed for up to 7 days for moderate pain.   venlafaxine XR 75 MG 24 hr capsule Commonly known as: EFFEXOR-XR Take 75 mg by mouth daily.       Follow-up Information    Jerline Pain, MD Follow up.   Specialty: Cardiology Why: Please see discharge paperwork for follow-up appointment with  cardiology Contact information: [address] [address] [phone]        Wonda Olds, MD Follow up.   Specialty: Cardiothoracic Surgery Why: Please see discharge paperwork for follow-up appointment with your surgeon. Contact information: [address] [place] [address] [place], [place] [facility] Follow up.   Why: 10:45 Monday/weds/fridays for dialysis. arrive at 10:35 for paperwork on first day of dialysis Contact information: [address] [place] [address] [phone]  The patient has been discharged on:   1.Beta Blocker:  Yes [ y  ]                              No   [   ]                              If No, reason:  2.Ace Inhibitor/ARB: Yes [   ]                                     No  [ n   ]                                     If No, reason:renal failure  3.Statin:   Yes [ y  ]                  No  [   ]                  If No, reason:  4.Ecasa:  Yes  [ y  ]                  No   [   ]                  If No, reason:  Signed: John Giovanni PA-C 01/01/2020, 10:35 AM

## 2019-12-26 NOTE — Progress Notes (Signed)
eLink Physician-Brief Progress Note Patient Name: Joseph Greer DOB: [birth year] MRN: [id number]   Date of Service  12/26/2019  HPI/Events of Note  Serum phosphorus <  1  eICU Interventions  Order to replete phosphorus entered.        Kerry Kass Veneda Kirksey 12/26/2019, 5:35 AM

## 2019-12-26 NOTE — Progress Notes (Signed)
Filter clotted on CRRT. Per Dr. Keturah Barre note, we will not restart CRRT if filter clots or expires.

## 2019-12-26 NOTE — Progress Notes (Addendum)
Patient ID: Joseph Greer, male   DOB: 01/01/1956, 65 y.o.   MRN: [id number]     Advanced Heart Failure Rounding Note  PCP-Cardiologist: Candee Furbish, MD  AHF:  Dr. [name]   Patient Profile   64 y/o male w/ h/o Hep C s/p liver transplant in 2009 (followed at [facility]), Stage III CKD, HTN and T2DM admitted w/ NSTEMI and Afib w/ RVR, found to have severe 3VD and biventricular heart failure and ? PFO, LVEF 20-25%, RV moderately reduced. S/p CABG + MAZE + LA appendage clipping 7/16.    Subjective:    - 7/16: CABG x 5 with LIMA-LAD, RIMA-ramus, seq SVG-PD/PL, SVG-D; Maze; LA appendage clipping. - 7/18 Limited echo: LVEF 35-40%. RV normal. No pericardial effusion.  - Post operative course c/b AKI and oliguria. CRRT initiated 7/19.  - 7/19 Milrinone discontinued>>changed to dobutamine. Developed hypotension w/ increase in pressor support. NE and VP added. Empiric abx added for ?HCAP.  Became increasingly tachycardic and dobutamine stopped overnight.   Remains on milrinone, dose reduced yesterday to 0.25. Co-ox low at 51%.   Remains on CVVH but RN unable to pull fluid off overnight w/ low BP (felt related to Precedex). BP improved w/ Precedex wean. CVP higher today ~12. Wt up 1 lb from yesterday.   SCr continues to trend down, 2.16 today (peaked to 6.5) but remains anuric.   AF. WBC trend 16>>18>>21>>16>>18>>13.4>>11.3>>9.6. CXR no PNA.   Tacrolimus restarted after discussion with his transplant team.    He is starting to feel depressed.   RHC Procedural Findings (pre-op): Hemodynamics (mmHg) RA mean 8 RV 55/11 PA 60/30, mean 44 PCWP mean 36 Oxygen saturations: PA 62% AO 97% Cardiac Output (Fick) 5.3  Cardiac Index (Fick) 2.61 Cardiac Output (Thermo) 5.11 Cardiac Index (Thermo) 2.52  PVR 1.6 WU  Objective:   Weight Range: 84.4 kg Body mass index is 26.7 kg/m.   Vital Signs:   Temp:  [97.7 F (36.5 C)-98.6 F (37 C)] 97.7 F (36.5 C) (07/23 0030) Pulse Rate:  [78-94]  82 (07/23 0630) Resp:  [14-27] 17 (07/23 0630) BP: (75-122)/(45-105) 106/58 (07/23 0630) SpO2:  [93 %-100 %] 95 % (07/23 0630) Weight:  [84.4 kg] 84.4 kg (07/23 0500) Last BM Date: 12/25/19  Weight change: Filed Weights   12/24/19 0453 12/25/19 0500 12/26/19 0500  Weight: 89.4 kg 84.1 kg 84.4 kg    Intake/Output:   Intake/Output Summary (Last 24 hours) at 12/26/2019 0709 Last data filed at 12/26/2019 0600 Gross per 24 hour  Intake 1739.3 ml  Output 2055 ml  Net -315.7 ml      Physical Exam   CVP 12 General: weak/ fatigue appearing. No distress  Neck: JVD elevated to jawline, no thyromegaly or thyroid nodule. + Lt IJ HD cath  Lungs: Decreased BS at bases.  CV: Nondisplaced PMI.  Heart regular S1/S2, no S3/S4, no murmur.  No peripheral edema. Sternal incision site ok Abdomen: Soft, nontender, no hepatosplenomegaly, no distention.  Skin: Intact without lesions or rashes.  Neurologic: Alert and oriented x 3.  Psych: flat affect. Appears depressed  Extremities: No clubbing or cyanosis.  HEENT: Normal. + cor track   Telemetry   NSR 90s, NSVT (personally reviewed)  EKG    No new EKG to review today.   Labs    CBC Recent Labs    12/25/19 0429 12/26/19 0347  WBC 11.3* 9.6  HGB 9.1* 8.6*  HCT 28.9* 27.7*  MCV 84.0 84.2  PLT 168 137*   Basic  Metabolic Panel Recent Labs    12/25/19 0429 12/25/19 0429 12/25/19 1604 12/26/19 0340  NA 133*   < > 133* 135  K 4.4   < > 4.0 4.3  CL 99   < > 99 101  CO2 24   < > 25 27  GLUCOSE 223*   < > 220* 206*  BUN 27*   < > 27* 23  CREATININE 2.54*   < > 2.48* 2.16*  CALCIUM 7.9*   < > 7.6* 7.5*  MG 2.6*  --   --  2.6*  PHOS 1.5*   < > 1.2* <1.0*   < > = values in this interval not displayed.   Liver Function Tests Recent Labs    12/25/19 1604 12/26/19 0340  ALBUMIN 2.4* 2.3*   No results for input(s): LIPASE, AMYLASE in the last 72 hours. Cardiac Enzymes No results for input(s): CKTOTAL, CKMB, CKMBINDEX,  TROPONINI in the last 72 hours.  BNP: BNP (last 3 results) No results for input(s): BNP in the last 8760 hours.  ProBNP (last 3 results) No results for input(s): PROBNP in the last 8760 hours.   D-Dimer No results for input(s): DDIMER in the last 72 hours. Hemoglobin A1C No results for input(s): HGBA1C in the last 72 hours. Fasting Lipid Panel No results for input(s): CHOL, HDL, LDLCALC, TRIG, CHOLHDL, LDLDIRECT in the last 72 hours. Thyroid Function Tests No results for input(s): TSH, T4TOTAL, T3FREE, THYROIDAB in the last 72 hours.  Invalid input(s): FREET3  Other results:   Imaging    No results found.   Medications:     Scheduled Medications: . amiodarone  200 mg Oral Daily  . apixaban  5 mg Oral BID  . Apremilast  1 tablet Oral Daily  . aspirin EC  81 mg Oral Daily  . atorvastatin  80 mg Oral Daily  . B-complex with vitamin C  1 tablet Per Tube Daily  . bisacodyl  10 mg Oral Daily   Or  . bisacodyl  10 mg Rectal Daily  . chlorhexidine gluconate (MEDLINE KIT)  15 mL Mouth Rinse BID  . Chlorhexidine Gluconate Cloth  6 each Topical Daily  . docusate sodium  200 mg Oral Daily  . feeding supplement (PROSource TF)  45 mL Per Tube BID  . insulin aspart  0-24 Units Subcutaneous Q4H  . pantoprazole  40 mg Oral Daily  . sodium chloride flush  3 mL Intravenous Q12H  . tacrolimus  1 mg Oral BID  . venlafaxine XR  37.5 mg Oral Daily    Infusions: .  prismasol BGK 4/2.5 400 mL/hr at 12/25/19 2140  . sodium chloride Stopped (12/21/19 1602)  . dexmedetomidine (PRECEDEX) IV infusion 0.1 mcg/kg/hr (12/26/19 4098)  . feeding supplement (VITAL 1.5 CAL) 60 mL/hr at 12/25/19 1800  . lactated ringers Stopped (12/19/19 1448)  . lactated ringers Stopped (12/20/19 1152)  . milrinone 0.25 mcg/kg/min (12/26/19 0600)  . prismasol BGK 2/2.5 replacement solution 300 mL/hr at 12/25/19 1645  . prismasol BGK 4/2.5 1,800 mL/hr at 12/26/19 1191  . sodium phosphate  Dextrose 5% IVPB       PRN Medications: Gerhardt's butt cream, heparin, insulin aspart, LORazepam, metoprolol tartrate, ondansetron (ZOFRAN) IV, oxyCODONE, senna-docusate, sodium chloride, sodium chloride flush    Assessment/Plan   1. CAD: Severe 3VD on cath 7/21 => 60-70% ostial LAD, 80% mLAD, 95% ramus, 99% proximal LCx with TIMI-2 flow down the rest of the LCx, occluded distal RCA.  Suspect out of hospital inferolateral  MI.  Cardiac MRI showed that the inferolateral wall is likely not viable, but the remainder of the LV myocardium is likely viable. He had CABG 7/16 with LIMA-LAD, RIMA-ramus, seq SVG-PD/PL, SVG-D.  - Continue ASA/clopidogrel + atorvastatin.   2. Acute on chronic systolic CHF: Echo with EF 20-25%, moderately decreased RV systolic function. Cardiac MRI with LV EF 26%, RV EF 38%, near full thickness scar in inferolateral wall suggesting lack of viability in the LCx territory.  Now post-CABG. Repeat limited echo 7/20 showed LVEF 20-25%, RV normal, no pericardial effusion. Remains on CVVH. CVP 12 today. No fluid removal overnight due to low BP, now improved.  Co-ox 51% today on milrinone 0.25.  - Continue milrinone at 0.25.  - Try to resuem UF today  with CVP back up to 12, try to pull 50 cc/hr.  Suspect he can stop CVVH soon and assess for iHD needs.  3. Atrial fibrillation: Patient admitted with afib/RVR, now in NSR. S/p Maze + LAA clipping on 7/16.  - Continue amiodarone gtt for now.  - on Eliquis now 4. AKI on CKD stage 3: Creatine has been stable 1.9-2.3 range in hospital pre-op.  Was 1.3 back in 4/21. Creatinine rose to 6.46 post-op. Anuric. Suspect post-op ATN. CRRT initiated 7/19. Weight now back to pre-op. SCr down to 2.16 but remains anuric.  - think we can stop CVVH soon and watch for iHD need (I am concerned that he will be HD-dependent).  5. Type 2 diabetes: SSI.  6. Liver transplant for HCV: tacrolimus currently on hold (? Toxicity, concomitate use w/ amiodarone can increase levels). On  7/18, level was 6.6 (not sure this was true trough) and within goal range for trough.  - He has been restarted on dose of tacrolimus in discussion with his transplant team.  - treated w/ stress dose steroids but discontinued yesterday (not on steroids at home), 7. ID: 7/19 developed hypotension w/ increasing pressor support, increasing leukocytosis and CXR w/ hazy opacities. Started on empiric abx for ? HCAP with vancomycin/cefepime => now off. BC NGTD. WBCs decreasing. 8. Neuro: Alert/oriented today, improved.   Mobilize with PT.   Lyda Jester, PA-C 12/26/2019 7:09 AM  Patient seen with PA, agree with the above note.    Co-ox lower today at 51% on milrinone 0.25.  CVP 8 on my assessment.  BP lower overnight, not pulling anything via CVVH.   He is awake/alert and oriented today.   General: NAD Neck: JVP 8 cm, no thyromegaly or thyroid nodule.  Lungs: Decreased at bases. CV: Nondisplaced PMI.  Heart regular S1/S2, no S3/S4, no murmur.  No peripheral edema.   Abdomen: Soft, nontender, no hepatosplenomegaly, no distention.  Skin: Intact without lesions or rashes.  Neurologic: Alert and oriented x 3.  Psych: Normal affect. Extremities: No clubbing or cyanosis.  HEENT: Normal.   Add midodrine 5 mg tid today, with CVP 8-9 would aim to pull around 50 cc/hr net UF.  Soon, will need to stop CVVH and look for renal recovery versus need for iHD.    Would keep milrinone at 0.25 today.   NSR on amiodarone gtt + Eliquis 5 mg bid.   He is alert, not confused today.   CRITICAL CARE Performed by: Loralie Champagne  Total critical care time: 35 minutes  Critical care time was exclusive of separately billable procedures and treating other patients.  Critical care was necessary to treat or prevent imminent or life-threatening deterioration.  Critical care was time spent personally  by me on the following activities: development of treatment plan with patient and/or surrogate as well as  nursing, discussions with consultants, evaluation of patient's response to treatment, examination of patient, obtaining history from patient or surrogate, ordering and performing treatments and interventions, ordering and review of laboratory studies, ordering and review of radiographic studies, pulse oximetry and re-evaluation of patient's condition.  Loralie Champagne 12/26/2019 8:02 AM

## 2019-12-26 NOTE — Progress Notes (Signed)
Called Dr. Gerrie Nordmann and let MD know that patient is complaining of chest pressure 4/10 with BP 103/56 and EKG done, oxycodone given and let MD know that this was patient's complaint last night as well. MD acknowledged and gave no new orders stating "I expect him to have some pain with the sternotomy."

## 2019-12-26 NOTE — Progress Notes (Signed)
Occupational Therapy Evaluation Patient Details Name: Joseph Greer MRN: [id number] DOB: Nov 06, 1955 Today's Date: 12/26/2019    History of Present Illness Pt is a 64 y.o. male admitted 12/11/19 for NSTEMI; found to have severe biventricular failure and multivessel CAD. S/p CABG x5, TEE, maze for afib on 7/16. Post operative course complicated by persistent LV dysfunction and AKI. CRRT initiated 7/19. PMH includes CKD III, DM2, HTN, s/p liver transplant (2009), Hep C, anxiety, depression.   Clinical Impression   PTA, pt lived with his son , was independent with ADL and mobility and worked at [employer] in [place], where he exercised regularly. Eval limited due to pt being on CRRT. Pt able to mobilize to chair with +2 Min A for equipment/lines. Requires Mod A for ADL tasks at this time due to deficits listed below. Began education on sternal precautions. Pt with flat affect however cognition appears to be improved as compared to yesterday. Pt talking about hallucinations and how scary they were. At this time recommend rehab at [facility] to facilitate safe DC home with intermittent S. Will follow acutely to maximize functional level of independence.     Follow Up Recommendations  CIR    Equipment Recommendations  3 in 1 bedside commode    Recommendations for Other Services Rehab consult     Precautions / Restrictions Precautions Precautions: Sternal;Fall;Other (comment) Precaution Booklet Issued: No Precaution Comments: CRRT, flexiseal; coretrack Restrictions Other Position/Activity Restrictions: sternal precautions      Mobility Bed Mobility Overal bed mobility: Needs Assistance Bed Mobility: Supine to Sit     Supine to sit: Min assist     General bed mobility comments: good use of pillow/steranl precautions  Transfers Overall transfer level: Needs assistance Equipment used: 2 person hand held assist Transfers: Sit to/from [facility] Sit to Stand: Min assist;+2  safety/equipment Stand pivot transfers: Min assist;+2 safety/equipment            Balance Overall balance assessment: Needs assistance   Sitting balance-Leahy Scale: Fair       Standing balance-Leahy Scale: Poor                             ADL either performed or assessed with clinical judgement   ADL Overall ADL's : Needs assistance/impaired Eating/Feeding: Set up   Grooming: Set up;Sitting   Upper Body Bathing: Minimal assistance;Sitting   Lower Body Bathing: Sit to/from stand;Moderate assistance   Upper Body Dressing : Moderate assistance;Sitting   Lower Body Dressing: Moderate assistance;Sit to/from stand   Toilet Transfer: Minimal assistance;Stand-pivot;+2 for safety/equipment Toilet Transfer Details (indicate cue type and reason): simulated Toileting- Clothing Manipulation and Hygiene: Moderate assistance       Functional mobility during ADLs: Minimal assistance;+2 for safety/equipment General ADL Comments: Good demonstration of keeping arms on cheest/pillow during mobility; began educaiton on sternal precautions     Vision Baseline Vision/History: Wears glasses Wears Glasses: Reading only (not in hospital)       Perception     Praxis      Pertinent Vitals/Pain Pain Assessment: No/denies pain     Hand Dominance Right   Extremity/Trunk Assessment Upper Extremity Assessment Upper Extremity Assessment: Generalized weakness (LUE limited due ot CRRT cath)   Lower Extremity Assessment Lower Extremity Assessment: Defer to PT evaluation   Cervical / Trunk Assessment Cervical / Trunk Assessment: Normal   Communication Communication Communication: No difficulties   Cognition Arousal/Alertness: Awake/alert Behavior During Therapy: Flat affect Overall  Cognitive Status: Impaired/Different from baseline Area of Impairment: Orientation;Attention;Safety/judgement;Awareness;Problem solving                 Orientation Level:  Disoriented to;Time ("August") Current Attention Level: Selective Memory: Decreased short-term memory;Decreased recall of precautions Following Commands: Follows one step commands consistently Safety/Judgement: Decreased awareness of safety Awareness: Emergent Problem Solving: Slow processing General Comments: Pt appeasr to be clearing cognitively; was having halluciantions; Pt able to talk about hallucinations and said "someone was trying to kill me"; pt apparently not sleeping well   General Comments       Exercises     Shoulder Instructions      Home Living Family/patient expects to be discharged to:: Private residence Living Arrangements: Children Available Help at Discharge: Family;Available PRN/intermittently Type of Home: House Home Access: Stairs to enter     Home Layout: One level     Bathroom Shower/Tub: Occupational psychologist: Handicapped height Bathroom Accessibility: Yes How Accessible: Accessible via walker Home Equipment: Shower seat;Grab bars - tub/shower;Walker - 2 wheels   Additional Comments: equipmetn from wife who is deceased      Prior Functioning/Environment Level of Independence: Independent        Comments: 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 Exercises 3x/wk (runs, strength training). Drives; enjoys doing yardwork; sevearl people who were colse to him have passed away recently        OT Problem List: Decreased strength;Decreased activity tolerance;Impaired balance (sitting and/or standing);Decreased cognition;Decreased safety awareness;Decreased knowledge of use of DME or AE;Cardiopulmonary status limiting activity      OT Treatment/Interventions: Self-care/ADL training;Therapeutic exercise;Energy conservation;DME and/or AE instruction;Therapeutic activities;Cognitive remediation/compensation;Patient/family education;Balance training    OT Goals(Current goals can be found in the care plan section)  Acute Rehab OT Goals Patient Stated Goal: Get back to work and exercising OT Goal Formulation: With patient Time For Goal Achievement: 01/09/20 Potential to Achieve Goals: Good  OT Frequency: Min 2X/week   Barriers to D/C:            Co-evaluation PT/OT/SLP Co-Evaluation/Treatment: Yes Reason for Co-Treatment: Complexity of the patient's impairments (multi-system involvement);To address functional/ADL transfers   OT goals addressed during session: ADL's and self-care      AM-PAC OT "6 Clicks" Daily Activity     Outcome Measure Help from another person eating meals?: A Little Help from another person taking care of personal grooming?: A Little Help from another person toileting, which includes using toliet, bedpan, or urinal?: A Lot Help from another person bathing (including washing, rinsing, drying)?: A Lot Help from another person to put on and taking off regular upper body clothing?: A Lot Help from another person to put on and taking off regular lower body clothing?: A Lot 6 Click Score: 14   End of Session Nurse Communication: Mobility status  Activity Tolerance: Patient tolerated treatment well Patient left: in chair;with call bell/phone within reach;with nursing/sitter in room;with chair alarm set  OT Visit Diagnosis: Unsteadiness on feet (R26.81);Muscle weakness (generalized) (M62.81);Other symptoms and signs involving cognitive function                Time: 1112-1140 OT Time Calculation (min): 28 min Charges:  OT General Charges $OT Visit: 1 Visit OT Evaluation $OT Eval High Complexity: 1 High  [place], OT/L   Acute OT Clinical Specialist Acute Rehabilitation Services Pager [phone] Office [phone]   [facility] 12/26/2019, 12:23 PM

## 2019-12-26 NOTE — Progress Notes (Addendum)
[address]       [place] [address]             [phone]      7 Days Post-Op Procedure(s) (LRB): CORONARY ARTERY BYPASS GRAFTING (CABG) TIMES  X 5,   USING BILATERAL MAMMARY ARTERY AND RIGHT LEG GREATER SAPHENOUS VEIN HARVESTED ENDOSCOPICALLY (N/A) TRANSESOPHAGEAL ECHOCARDIOGRAM (TEE) (N/A) INDOCYANINE GREEN FLUORESCENCE IMAGING (ICG) (N/A) MAZE (N/A) CLIPPING OF ATRIAL APPENDAGE USING ATRICURE CLIP SIZE 50MM (N/A) Subjective: Feels "better".  Objective: Vital signs in last 24 hours: Temp:  [97.4 F (36.3 C)-98.6 F (37 C)] 97.4 F (36.3 C) (07/23 0730) Pulse Rate:  [78-94] 84 (07/23 0800) Cardiac Rhythm: Normal sinus rhythm (07/23 0400) Resp:  [14-28] 22 (07/23 0800) BP: (75-122)/(45-105) 107/60 (07/23 0800) SpO2:  [93 %-100 %] 100 % (07/23 0800) Weight:  [84.4 kg] 84.4 kg (07/23 0500)  Hemodynamic parameters for last 24 hours: CVP:  [2 mmHg-14 mmHg] 8 mmHg  Intake/Output from previous day: 07/22 0701 - 07/23 0700 In: 1807.7 [P.O.:50; I.V.:237.7; NG/GT:1520] Out: 2055  Intake/Output this shift: Total I/O In: 68.2 [I.V.:8.2; NG/GT:60] Out: 52 [Other:52]  General appearance: alert, cooperative and no distress Heart: regular rate and rhythm Lungs: mildly dim in bases Abdomen: soft, nontender Extremities: no edema Wound: incis healing well  Lab Results: Recent Labs    12/25/19 0429 12/26/19 0347  WBC 11.3* 9.6  HGB 9.1* 8.6*  HCT 28.9* 27.7*  PLT 168 137*   BMET:  Recent Labs    12/25/19 1604 12/26/19 0340  NA 133* 135  K 4.0 4.3  CL 99 101  CO2 25 27  GLUCOSE 220* 206*  BUN 27* 23  CREATININE 2.48* 2.16*  CALCIUM 7.6* 7.5*    PT/INR: No results for input(s): LABPROT, INR in the last 72 hours. ABG    Component Value Date/Time   PHART 7.241 (L) 12/22/2019 1411   HCO3 18.3 (L) 12/22/2019 1411   TCO2 20 (L) 12/22/2019 1411   ACIDBASEDEF 8.0 (H) 12/22/2019 1411   O2SAT 51.1 12/26/2019 0340   CBG (last 3)  Recent  Labs    12/26/19 0022 12/26/19 0339 12/26/19 0652  GLUCAP 166* 205* 165*    Meds Scheduled Meds: . amiodarone  200 mg Oral Daily  . apixaban  5 mg Oral BID  . Apremilast  1 tablet Oral Daily  . aspirin EC  81 mg Oral Daily  . atorvastatin  80 mg Oral Daily  . B-complex with vitamin C  1 tablet Per Tube Daily  . bisacodyl  10 mg Oral Daily   Or  . bisacodyl  10 mg Rectal Daily  . chlorhexidine gluconate (MEDLINE KIT)  15 mL Mouth Rinse BID  . Chlorhexidine Gluconate Cloth  6 each Topical Daily  . docusate sodium  200 mg Oral Daily  . feeding supplement (PROSource TF)  45 mL Per Tube BID  . insulin aspart  0-24 Units Subcutaneous Q4H  . pantoprazole  40 mg Oral Daily  . sodium chloride flush  3 mL Intravenous Q12H  . tacrolimus  1 mg Oral BID  . venlafaxine XR  37.5 mg Oral Daily   Continuous Infusions: .  prismasol BGK 4/2.5 400 mL/hr at 12/25/19 2140  . sodium chloride Stopped (12/21/19 1602)  . dexmedetomidine (PRECEDEX) IV infusion 0.1 mcg/kg/hr (12/26/19 0800)  . feeding supplement (VITAL 1.5 CAL) 60 mL/hr at 12/25/19 1800  . lactated ringers Stopped (12/19/19 1448)  . lactated ringers Stopped (12/20/19 1152)  .  milrinone 0.25 mcg/kg/min (12/26/19 0800)  . prismasol BGK 2/2.5 replacement solution 300 mL/hr at 12/25/19 1645  . prismasol BGK 4/2.5 1,800 mL/hr at 12/26/19 2787  . sodium phosphate  Dextrose 5% IVPB 30 mmol (12/26/19 0809)   PRN Meds:.Gerhardt's butt cream, heparin, insulin aspart, LORazepam, metoprolol tartrate, ondansetron (ZOFRAN) IV, oxyCODONE, senna-docusate, sodium chloride, sodium chloride flush  Xrays No results found.  Assessment/Plan: S/P Procedure(s) (LRB): CORONARY ARTERY BYPASS GRAFTING (CABG) TIMES  X 5,   USING BILATERAL MAMMARY ARTERY AND RIGHT LEG GREATER SAPHENOUS VEIN HARVESTED ENDOSCOPICALLY (N/A) TRANSESOPHAGEAL ECHOCARDIOGRAM (TEE) (N/A) INDOCYANINE GREEN FLUORESCENCE IMAGING (ICG) (N/A) MAZE (N/A) CLIPPING OF ATRIAL APPENDAGE  USING ATRICURE CLIP SIZE 50MM (N/A)  1Stable on .25 milrinone, CO-OX 51.1,acute on chronic syst HF,  conts CVVH- nephrology and AHF primarily managing these issues. May need iHD- creat conts to trend down 2 H/H down a little, monitor 3 phosphorus being replaced 4 CBG adeq on SSI 5 conts TF's 6 PT/ pulm toliet, min atx on CXR 7 maintaining sinus with fusion complexes, + PVC's  LOS: 14 days    John Giovanni PA-C Pager [phone] 12/26/2019 Pt seen and examined; I agree with PA documentation. Plan increased PT perhaps through CVVHD "holiday". Wean milrinone as tolerated. Ronald Londo Z. Orvan Seen, [place]

## 2019-12-26 NOTE — Progress Notes (Signed)
Called [facility] and spoke with Kathlee Nations, RN and made her aware that patient is asking for something to help him sleep. Kathlee Nations, RN to relay message to [facility] MD.

## 2019-12-26 NOTE — Progress Notes (Signed)
NAME:  Joseph Greer, MRN:  [id number], DOB:  [birth year], LOS: 68 ADMISSION DATE:  12/11/2019, CONSULTATION DATE:  12/22/2019 REFERRING MD:  Orvan Seen, CHIEF COMPLAINT:  shock   HPI/course in hospital  64 year old man who was admitted for NSTEMI and was found to have severe biventricular failure and 3V CAD.  EF was 25%  Prior liver transplant at [facility] 2009.   7/16 - CABGx5 with Maze for Afib.   Post operative course complicated by persistent LV dysfunction and AKI.   CRRT initiated 7/19.   Past Medical History   Past Medical History:  Diagnosis Date  . Anxiety   . CKD (chronic kidney disease), stage III   . Depression   . Diabetes mellitus type 2, noninsulin dependent ([place])   . Hepatitis C   . Hypertension   . Psoriasis   . Status post liver transplant [facility])      Past Surgical History:  Procedure Laterality Date  . CLIPPING OF ATRIAL APPENDAGE N/A 12/19/2019   Procedure: CLIPPING OF ATRIAL APPENDAGE USING ATRICURE CLIP SIZE 50MM;  Surgeon: Wonda Olds, MD;  Location: [place];  Service: Open Heart Surgery;  Laterality: N/A;  . COLONOSCOPY    . COLONOSCOPY WITH PROPOFOL N/A 06/01/2017   Procedure: COLONOSCOPY WITH PROPOFOL;  Surgeon: Rogene Houston, MD;  [facility];  Service: Endoscopy;  Laterality: N/A;  7:30  . CORONARY ARTERY BYPASS GRAFT N/A 12/19/2019   Procedure: CORONARY ARTERY BYPASS GRAFTING (CABG) TIMES  X 5,   USING BILATERAL MAMMARY ARTERY AND RIGHT LEG GREATER SAPHENOUS VEIN HARVESTED ENDOSCOPICALLY;  Surgeon: Wonda Olds, MD;  Location: [place];  Service: Open Heart Surgery;  Laterality: N/A;  . HERNIA REPAIR Right   . LEFT HEART CATH AND CORONARY ANGIOGRAPHY N/A 12/12/2019   Procedure: LEFT HEART CATH AND CORONARY ANGIOGRAPHY;  Surgeon: Jettie Booze, MD;  Location: [place] CV [facility];  Service: Cardiovascular;  Laterality: N/A;  . LIVER BIOPSY    . LIVER SURGERY    . MAZE N/A 12/19/2019   Procedure: MAZE;  Surgeon: Wonda Olds,  MD;  Location: [place];  Service: Open Heart Surgery;  Laterality: N/A;  . POLYPECTOMY  06/01/2017   Procedure: POLYPECTOMY;  Surgeon: Rogene Houston, MD;  [facility];  Service: Endoscopy;;  polyp at sigmoid colon x2, ascending colon polyp, hepatic flexure polyp  . RIGHT HEART CATH N/A 12/17/2019   Procedure: RIGHT HEART CATH;  Surgeon: Larey Dresser, MD;  Location: [place] CV LAB;  Service: Cardiovascular;  Laterality: N/A;  . TEE WITHOUT CARDIOVERSION N/A 12/19/2019   Procedure: TRANSESOPHAGEAL ECHOCARDIOGRAM (TEE);  Surgeon: Wonda Olds, MD;  Location: [place];  Service: Open Heart Surgery;  Laterality: N/A;  . UPPER GASTROINTESTINAL ENDOSCOPY        Interim history/subjective:  7/23: POD #7 Started on Apixaban yesterday. Continued to have hallucinations yesterday. He is aware of these hallucinations and knows they are not real. Started on Precedex to help with ICU delirium.   7/22: POD #6 Alert and oriented on exam. Report of some hallucinations overnight by RN. Off of supplemental oxygen and milrinone dose reduced. CTS starting Eliquis today. Continuing Tacrolimus at half dose while awaiting levels.   7/21:POD#5. Epi stopped. On Milrinone .375.   7/20 :POD#4 from his CABG x 5 , MAZE, and left appendage clipping 7/16.Weaned off off of Levo and Vaso. Dobutamine stopped. On epinephrine and milrinone restarted this am. This morning patient is more  alert. Continues to have a tremor. Tacro level is pending, last one was 7/15.     Objective   Blood pressure (!) 106/58, pulse 82, temperature 97.7 F (36.5 C), temperature source Oral, resp. rate 17, height 5\' 10"  (1.778 m), weight 84.4 kg, SpO2 95 %. CVP:  [2 mmHg-14 mmHg] 10 mmHg      Intake/Output Summary (Last 24 hours) at 12/26/2019 0657 Last data filed at 12/26/2019 0600 Gross per 24 hour  Intake 1809.78 ml  Output 2366 ml  Net -556.22 ml   Filed Weights   12/24/19 0453 12/25/19 0500 12/26/19 0500  Weight:  89.4 kg 84.1 kg 84.4 kg   Examinations:  General: NAD, resting in bed HE: Normocephalic, atraumatic , EOMI, Conjunctivae normal ENT: No congestion, no rhinorrhea, no exudate or erythema  Cardiovascular: Normal rate, regular rhythm.  No murmurs, rubs, or gallops Pulmonary : Effort normal, breath sounds normal. No wheezes, rales, or rhonchi Abdominal: soft, nontender,  bowel sounds present Musculoskeletal: no swelling , deformity, injury ,or tenderness in extremities Skin: Warm, dry , no bruising, erythema, or rash Psychiatric/Behavioral:  anxious mood, anxious behavior  Neuro: Alert and oriented x 4   Coox is 51% Hgb 9 - stable Cr 2.16 on CRRT, Mg 2.6, K 4.3 , Phos <1 Ancillary tests (personally reviewed)  CBC: Recent Labs  Lab 12/22/19 1411 12/22/19 1411 12/22/19 1530 12/23/19 0436 12/24/19 0433 12/25/19 0429 12/26/19 0347  WBC 16.4*  --   --  18.2* 13.9* 11.3* 9.6  HGB 6.6*  7.1*   < > 6.7* 8.7* 8.6* 9.1* 8.6*  HCT 21.5*  21.0*   < > 21.9* 27.6* 27.4* 28.9* 27.7*  MCV 84.3  --   --  84.1 83.8 84.0 84.2  PLT 158  --   --  156 156 168 137*   < > = values in this interval not displayed.    Basic Metabolic Panel: Recent Labs  Lab 12/23/19 0436 12/23/19 0436 12/23/19 1544 12/23/19 1544 12/24/19 0433 12/24/19 1627 12/25/19 0429 12/25/19 1604 12/26/19 0340  NA 135   < > 134*   < > 135 136 133* 133* 135  K 5.2*   < > 4.8   < > 4.8 4.4 4.4 4.0 4.3  CL 101   < > 99   < > 101 100 99 99 101  CO2 21*   < > 24   < > 24 27 24 25 27   GLUCOSE 162*   < > 240*   < > 235* 190* 223* 220* 206*  BUN 34*   < > 29*   < > 30* 30* 27* 27* 23  CREATININE 4.07*   < > 3.24*   < > 3.01* 2.61* 2.54* 2.48* 2.16*  CALCIUM 8.0*   < > 7.8*   < > 8.0* 7.9* 7.9* 7.6* 7.5*  MG 2.4  --  2.6*  --  2.6*  --  2.6*  --  2.6*  PHOS 5.1*   < > 3.8   < > 2.9 2.2* 1.5* 1.2* <1.0*   < > = values in this interval not displayed.   GFR: Estimated Creatinine Clearance: 36.6 mL/min (A) (by C-G formula  based on SCr of 2.16 mg/dL (H)). Recent Labs  Lab 12/22/19 1411 12/22/19 1548 12/22/19 1845 12/23/19 0436 12/24/19 0433 12/25/19 0429 12/26/19 0347  WBC   < >  --   --  18.2* 13.9* 11.3* 9.6  LATICACIDVEN  --  4.9* 2.8*  --   --   --   --    < > =  values in this interval not displayed.    Liver Function Tests: Recent Labs  Lab 12/22/19 1530 12/23/19 0436 12/24/19 0433 12/24/19 1627 12/25/19 0429 12/25/19 1604 12/26/19 0340  AST 31  --   --   --   --   --   --   ALT 20  --   --   --   --   --   --   ALKPHOS 66  --   --   --   --   --   --   BILITOT 0.9  --   --   --   --   --   --   PROT 5.4*  --   --   --   --   --   --   ALBUMIN 2.3*   < > 2.6* 2.5* 2.5* 2.4* 2.3*   < > = values in this interval not displayed.   No results for input(s): LIPASE, AMYLASE in the last 168 hours. No results for input(s): AMMONIA in the last 168 hours.  ABG    Component Value Date/Time   PHART 7.241 (L) 12/22/2019 1411   PCO2ART 42.6 12/22/2019 1411   PO2ART 42 (L) 12/22/2019 1411   HCO3 18.3 (L) 12/22/2019 1411   TCO2 20 (L) 12/22/2019 1411   ACIDBASEDEF 8.0 (H) 12/22/2019 1411   O2SAT 51.1 12/26/2019 0340     Coagulation Profile: Recent Labs  Lab 12/19/19 1500  INR 1.4*    Cardiac Enzymes: No results for input(s): CKTOTAL, CKMB, CKMBINDEX, TROPONINI in the last 168 hours.  HbA1C: Hgb A1c MFr Bld  Date/Time Value Ref Range Status  12/11/2019 09:40 PM 5.9 (H) 4.8 - 5.6 % Final    Comment:    (NOTE) Pre diabetes:          5.7%-6.4%  Diabetes:              >6.4%  Glycemic control for   <7.0% adults with diabetes   03/26/2013 10:30 PM 5.8 (H) <5.7 % Final    Comment:    (NOTE)                                                                       According to the ADA Clinical Practice Recommendations for 2011, when HbA1c is used as a screening test:  >=6.5%   Diagnostic of Diabetes Mellitus           (if abnormal result is confirmed) 5.7-6.4%   Increased risk of  developing Diabetes Mellitus References:Diagnosis and Classification of Diabetes Mellitus,Diabetes KGUR,4270,62(BJSEG 1):S62-S69 and Standards of Medical Care in         Diabetes - 2011,Diabetes Care,2011,34 (Suppl 1):S11-S61.    CBG: Recent Labs  Lab 12/25/19 1606 12/25/19 1934 12/26/19 0022 12/26/19 0339 12/26/19 0652  GLUCAP 195* 239* 166* 205* 165*    Assessment & Plan:   CAD with ischemic cardiomyopathy S/P CABGx5.  Co-ox 51% today on Milrinone .25.   Plan: - HF Cardiology consulting - Continue ASA and atorvastatin - Milirinone .25  - PT/OT  Acute hypoxic respiratory failure ( resolved , weaned off supplemental oxygen 7/22)  Atrial Fibrillation s/p MAZE Sinus rhythm today. On IV Amio and in NSR.  Plan: - Continue PO  amio 200 daily - Continue Eliquis   AKI requiring CRRT CKD stage III  CRRT at 50 ml/hr, remains anuric  Plan: - Daily RFP - Plan per nephrology   Status post liver transplant - Tacrolimus levels are not back.Restarted on half home dose. HF Pharmacy coordinating with transplant center.  Plan: - Tacro 1mg  BID - Follow up Tacro level from 7/21 - Follow up recommendations on Tacro dosing   Steroid induced hyperglycemia Now off steroids . Blood glucose improving. Will need to stop tube feeds at some point to encourage PO intake. Will start with giving regular diet today with fluid restrictions ( will not eat current diet) and tube feeds at night.  Plan: - No Basal regimen at this time - 3 units with tube feeds. - SSI for bolus - monitor CBG  Hospital Delirium  Anxiety/depression Patient lost his wife a year and half ago. Reports anxiety and depression at baseline now. His current health condition has worsened his anxiety. His hallucination went away with Precedex used for sleep overnight. He reports he didn't sleep well, but  RN observed his sleep was better. Will continue Precedex at night and his home ativan during the day. Also Effexor.   Plan: Effexor daily PRN Ativan BID Precedex qhs for sleep  Daily Goals Checklist  Pain/Anxiety/Delirium protocol (if indicated): Precedex DVT prophylaxis: apixiban Nutritional status and feeding goals: HH and tube feed, will need to stop tube feeds to encourage PO intake Fluid status goals: CRRT - 56ml/hr UF Urinary catheter: no catheter Central lines: right introducer, left HD cath Glucose control: euglycemic Mobility/therapy needs: OOB to chair Daily labs: RFP, MG Code Status: full Family Communication: per cardiac surgery Disposition: ICU  Tamsen Snider, MD PGY2

## 2019-12-26 NOTE — Progress Notes (Signed)
eLink Physician-Brief Progress Note Patient Name: Joseph Greer DOB: 11/24/1955 MRN: [id number]   Date of Service  12/26/2019  HPI/Events of Note  Phosphorus reported as 1.1, it was 4.0 at 4 pm.  eICU Interventions  Will repeat the phosphorus to make sure it is an accurate number.        Kerry Kass [name] 12/26/2019, 10:49 PM

## 2019-12-26 NOTE — Progress Notes (Signed)
Physical Therapy Treatment Patient Details Name: Joseph Greer MRN: [id number] DOB: 12-13-1955 Today's Date: 12/26/2019    History of Present Illness Pt is a 64 y.o. male admitted 12/11/19 for NSTEMI; found to have severe biventricular failure and multivessel CAD. S/p CABG x5, TEE, maze for afib on 7/16. Post operative course complicated by persistent LV dysfunction and AKI. CRRT initiated 7/19. PMH includes CKD III, DM2, HTN, s/p liver transplant (2009), Hep C, anxiety, depression.    PT Comments    Limited to bed to chair and stepping in place due to pt on CRRT. Continue to work on strengthening and mobility. Pt instructed in ankle pumps and quad sets to perform throughout the day.    Follow Up Recommendations  CIR     Equipment Recommendations  Other (comment) (To be determined)    Recommendations for Other Services       Precautions / Restrictions Precautions Precautions: Sternal;Fall;Other (comment) Precaution Booklet Issued: No Precaution Comments: CRRT, flexiseal; coretrack Restrictions Other Position/Activity Restrictions: sternal precautions    Mobility  Bed Mobility Overal bed mobility: Needs Assistance Bed Mobility: Supine to Sit     Supine to sit: Min assist     General bed mobility comments: Assist to elevate trunk into sitting. Good use of pillow/steranl precautions  Transfers Overall transfer level: Needs assistance Equipment used: 2 person hand held assist Transfers: Sit to/from [facility] Sit to Stand: Min assist;+2 safety/equipment Stand pivot transfers: Min assist;+2 safety/equipment       General transfer comment: Assist to bring hips up and for balance.   Ambulation/Gait Ambulation/Gait assistance: Min assist;+2 safety/equipment Gait Distance (Feet): 3 Feet Assistive device: 2 person hand held assist Gait Pattern/deviations: Step-to pattern;Shuffle Gait velocity: decr Gait velocity interpretation: <1.31 ft/sec,  indicative of household ambulator General Gait Details: Distance limited by pt on CRRT. Pt amb several feet to chair and then did some stepping in place. Extended standing.    Stairs             Wheelchair Mobility    Modified Rankin (Stroke Patients Only)       Balance Overall balance assessment: Needs assistance Sitting-balance support: No upper extremity supported;Feet supported Sitting balance-Leahy Scale: Fair     Standing balance support: Single extremity supported Standing balance-Leahy Scale: Poor Standing balance comment: UE support and min assist for static standing                            Cognition Arousal/Alertness: Awake/alert Behavior During Therapy: Flat affect Overall Cognitive Status: Impaired/Different from baseline Area of Impairment: Orientation;Attention;Safety/judgement;Awareness;Problem solving                 Orientation Level: Disoriented to;Time ("August") Current Attention Level: Selective Memory: Decreased short-term memory;Decreased recall of precautions Following Commands: Follows one step commands consistently Safety/Judgement: Decreased awareness of safety Awareness: Emergent Problem Solving: Slow processing General Comments: Pt appeasr to be clearing cognitively; was having halluciantions; Pt able to talk about hallucinations and said "someone was trying to kill me"; pt apparently not sleeping well      Exercises      General Comments General comments (skin integrity, edema, etc.): VSS      Pertinent Vitals/Pain Pain Assessment: No/denies pain    Home Living Family/patient expects to be discharged to:: Private residence Living Arrangements: Children Available Help at Discharge: Family;Available PRN/intermittently Type of Home: House Home Access: Stairs to enter   Home Layout: One level Home Equipment:  Shower seat;Grab bars - tub/shower;Walker - 2 wheels Additional Comments: equipmetn from wife who  is deceased    Prior Function Level of Independence: Independent      Comments: 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 Exercises 3x/wk (runs, strength training). Drives; enjoys doing yardwork; sevearl people who were colse to him have passed away recently   PT Goals (current goals can now be found in the care plan section) Acute Rehab PT Goals Patient Stated Goal: Get back to work and exercising Progress towards PT goals: Not progressing toward goals - comment (limited by CRRT)    Frequency    Min 3X/week      PT Plan Current plan remains appropriate    Co-evaluation PT/OT/SLP Co-Evaluation/Treatment: Yes Reason for Co-Treatment: Complexity of the patient's impairments (multi-system involvement) PT goals addressed during session: Mobility/safety with mobility;Balance OT goals addressed during session: ADL's and self-care      AM-PAC PT "6 Clicks" Mobility   Outcome Measure  Help needed turning from your back to your side while in a flat bed without using bedrails?: A Little Help needed moving from lying on your back to sitting on the side of a flat bed without using bedrails?: A Little Help needed moving to and from a bed to a chair (including a wheelchair)?: A Little Help needed standing up from a chair using your arms (e.g., wheelchair or bedside chair)?: A Little Help needed to walk in hospital room?: A Little Help needed climbing 3-5 steps with a railing? : A Little 6 Click Score: 18    End of Session   Activity Tolerance: Patient tolerated treatment well Patient left: in chair;with call bell/phone within reach;with chair alarm set;with nursing/sitter in room Nurse Communication: Mobility status (nurse present during transfer) PT Visit Diagnosis: Other abnormalities of gait and mobility (R26.89);Difficulty in walking, not elsewhere classified (R26.2)     Time: 1111-1130 PT Time Calculation (min) (ACUTE ONLY): 19 min  Charges:   $Therapeutic Activity: 8-22 mins                     [place] Pager [phone] Office [place] 12/26/2019, 1:21 PM

## 2019-12-26 NOTE — Progress Notes (Signed)
Called [facility] and spoke with Kathlee Nations, RN and let her know of phosphorus level of 1.1 and that CRRT was stopped at 2030 per nephrology note that stated to stop if filter clots. Kathlee Nations, RN to relay to [facility] MD.

## 2019-12-27 ENCOUNTER — Inpatient Hospital Stay (HOSPITAL_COMMUNITY): Payer: Medicare HMO

## 2019-12-27 LAB — MAGNESIUM: Magnesium: 2.4 mg/dL (ref 1.7–2.4)

## 2019-12-27 LAB — CBC
HCT: 28.7 % — ABNORMAL LOW (ref 39.0–52.0)
Hemoglobin: 8.8 g/dL — ABNORMAL LOW (ref 13.0–17.0)
MCH: 26 pg (ref 26.0–34.0)
MCHC: 30.7 g/dL (ref 30.0–36.0)
MCV: 84.7 fL (ref 80.0–100.0)
Platelets: 158 10*3/uL (ref 150–400)
RBC: 3.39 MIL/uL — ABNORMAL LOW (ref 4.22–5.81)
RDW: 16.1 % — ABNORMAL HIGH (ref 11.5–15.5)
WBC: 13.1 10*3/uL — ABNORMAL HIGH (ref 4.0–10.5)
nRBC: 1.4 % — ABNORMAL HIGH (ref 0.0–0.2)

## 2019-12-27 LAB — RENAL FUNCTION PANEL
Albumin: 2.3 g/dL — ABNORMAL LOW (ref 3.5–5.0)
Albumin: 2.4 g/dL — ABNORMAL LOW (ref 3.5–5.0)
Anion gap: 8 (ref 5–15)
Anion gap: 13 (ref 5–15)
BUN: 33 mg/dL — ABNORMAL HIGH (ref 8–23)
BUN: 46 mg/dL — ABNORMAL HIGH (ref 8–23)
CO2: 26 mmol/L (ref 22–32)
CO2: 24 mmol/L (ref 22–32)
Calcium: 7.5 mg/dL — ABNORMAL LOW (ref 8.9–10.3)
Calcium: 7.2 mg/dL — ABNORMAL LOW (ref 8.9–10.3)
Chloride: 99 mmol/L (ref 98–111)
Chloride: 97 mmol/L — ABNORMAL LOW (ref 98–111)
Creatinine, Ser: 2.99 mg/dL — ABNORMAL HIGH (ref 0.61–1.24)
Creatinine, Ser: 3.7 mg/dL — ABNORMAL HIGH (ref 0.61–1.24)
GFR calc Af Amer: 25 mL/min — ABNORMAL LOW (ref >60)
GFR calc Af Amer: 19 mL/min — ABNORMAL LOW (ref >60)
GFR calc non Af Amer: 21 mL/min — ABNORMAL LOW (ref >60)
GFR calc non Af Amer: 17 mL/min — ABNORMAL LOW (ref >60)
Glucose, Bld: 225 mg/dL — ABNORMAL HIGH (ref 70–99)
Glucose, Bld: 173 mg/dL — ABNORMAL HIGH (ref 70–99)
Phosphorus: 1.1 mg/dL — ABNORMAL LOW (ref 2.5–4.6)
Phosphorus: 7.5 mg/dL — ABNORMAL HIGH (ref 2.5–4.6)
Potassium: 4.7 mmol/L (ref 3.5–5.1)
Potassium: 4.7 mmol/L (ref 3.5–5.1)
Sodium: 133 mmol/L — ABNORMAL LOW (ref 135–145)
Sodium: 134 mmol/L — ABNORMAL LOW (ref 135–145)

## 2019-12-27 LAB — COOXEMETRY PANEL
Carboxyhemoglobin: 1.7 % — ABNORMAL HIGH (ref 0.5–1.5)
Carboxyhemoglobin: 1.6 % — ABNORMAL HIGH (ref 0.5–1.5)
Methemoglobin: 1.2 % (ref 0.0–1.5)
Methemoglobin: 1.2 % (ref 0.0–1.5)
O2 Saturation: 64 %
O2 Saturation: 56.6 %
Total hemoglobin: 11 g/dL — ABNORMAL LOW (ref 12.0–16.0)
Total hemoglobin: 9.8 g/dL — ABNORMAL LOW (ref 12.0–16.0)

## 2019-12-27 LAB — GLUCOSE, CAPILLARY
Glucose-Capillary: 212 mg/dL — ABNORMAL HIGH (ref 70–99)
Glucose-Capillary: 190 mg/dL — ABNORMAL HIGH (ref 70–99)
Glucose-Capillary: 159 mg/dL — ABNORMAL HIGH (ref 70–99)
Glucose-Capillary: 182 mg/dL — ABNORMAL HIGH (ref 70–99)
Glucose-Capillary: 140 mg/dL — ABNORMAL HIGH (ref 70–99)
Glucose-Capillary: 134 mg/dL — ABNORMAL HIGH (ref 70–99)

## 2019-12-27 LAB — HEPATIC FUNCTION PANEL
ALT: 130 U/L — ABNORMAL HIGH (ref 0–44)
AST: 169 U/L — ABNORMAL HIGH (ref 15–41)
Albumin: 2.3 g/dL — ABNORMAL LOW (ref 3.5–5.0)
Alkaline Phosphatase: 307 U/L — ABNORMAL HIGH (ref 38–126)
Bilirubin, Direct: 0.2 mg/dL (ref 0.0–0.2)
Indirect Bilirubin: 0.3 mg/dL (ref 0.3–0.9)
Total Bilirubin: 0.5 mg/dL (ref 0.3–1.2)
Total Protein: 5.6 g/dL — ABNORMAL LOW (ref 6.5–8.1)

## 2019-12-27 LAB — CULTURE, BLOOD (ROUTINE X 2)
Culture: NO GROWTH
Culture: NO GROWTH
Special Requests: ADEQUATE
Special Requests: ADEQUATE

## 2019-12-27 LAB — PHOSPHORUS
Phosphorus: <1 mg/dL — CL (ref 2.5–4.6)
Phosphorus: 6 mg/dL — ABNORMAL HIGH (ref 2.5–4.6)

## 2019-12-27 MED ORDER — SODIUM PHOSPHATES 45 MMOLE/15ML IV SOLN
45.0000 mmol | Freq: Once | INTRAVENOUS | Status: AC
  Administered 2019-12-27: 45 mmol via INTRAVENOUS
  Filled 2019-12-27: qty 15

## 2019-12-27 MED ORDER — INSULIN DETEMIR 100 UNIT/ML ~~LOC~~ SOLN
5.0000 [IU] | Freq: Two times a day (BID) | SUBCUTANEOUS | Status: DC
  Administered 2019-12-27 – 2020-01-01 (×11): 5 [IU] via SUBCUTANEOUS
  Filled 2019-12-27 (×14): qty 0.05

## 2019-12-27 MED ORDER — MELATONIN 3 MG PO TABS
9.0000 mg | ORAL_TABLET | Freq: Every evening | ORAL | Status: DC | PRN
  Administered 2019-12-27 – 2019-12-31 (×5): 9 mg via ORAL
  Filled 2019-12-27 (×5): qty 3

## 2019-12-27 NOTE — Progress Notes (Signed)
Patient ID: Joseph Greer, male   DOB: 19-Dec-1955, 64 y.o.   MRN: [id number]     Advanced Heart Failure Rounding Note  PCP-Cardiologist: Candee Furbish, MD  AHF:  Dr. [name]   Patient Profile   64 y/o male w/ h/o Hep C s/p liver transplant in 2009 (followed at [facility]), Stage III CKD, HTN and T2DM admitted w/ NSTEMI and Afib w/ RVR, found to have severe 3VD and biventricular heart failure and ? PFO, LVEF 20-25%, RV moderately reduced. S/p CABG + MAZE + LA appendage clipping 7/16.    Subjective:    - 7/16: CABG x 5 with LIMA-LAD, RIMA-ramus, seq SVG-PD/PL, SVG-D; Maze; LA appendage clipping. - 7/18 Limited echo: LVEF 35-40%. RV normal. No pericardial effusion.  - Post operative course c/b AKI and oliguria. CRRT initiated 7/19.  - 7/19 Milrinone discontinued>>changed to dobutamine. Developed hypotension w/ increase in pressor support. NE and VP added. Empiric abx added for ?HCAP.  Became increasingly tachycardic and dobutamine stopped overnight.   Remains on milrinone 0.25. Co-ox 51 -> 64%  Remains anuric. CVVHD machine circuit clotted at 830p ans topped by Renal. Weight up 3 pounds overnight  CVP 10 (checked personally)  Denies SOB, orthopnea or PND. Having trouble sleeping.   Tacrolimus restarted after discussion with his transplant team.    Objective:   Weight Range: 85.9 kg Body mass index is 27.17 kg/m.   Vital Signs:   Temp:  [97.4 F (36.3 C)-98.8 F (37.1 C)] 97.9 F (36.6 C) (07/24 0430) Pulse Rate:  [80-92] 89 (07/24 0530) Resp:  [15-45] 16 (07/24 0530) BP: (93-121)/(51-67) 99/60 (07/24 0530) SpO2:  [95 %-100 %] 95 % (07/24 0530) Weight:  [85.9 kg] 85.9 kg (07/24 0459) Last BM Date: 12/26/19  Weight change: Filed Weights   12/25/19 0500 12/26/19 0500 12/27/19 0459  Weight: 84.1 kg 84.4 kg 85.9 kg    Intake/Output:   Intake/Output Summary (Last 24 hours) at 12/27/2019 0614 Last data filed at 12/27/2019 0500 Gross per 24 hour  Intake 1920.53 ml  Output 1735  ml  Net 185.53 ml      Physical Exam   CVP 10 General:  Sitting up in bed No resp difficulty HEENT: normal Neck: supple. LIJ trialysis Carotids 2+ bilat; no bruits. No lymphadenopathy or thryomegaly appreciated. Cor: PMI nondisplaced. Regular rate & rhythm. No rubs, gallops or murmurs. Sternal incision ok Lungs: clear Abdomen: soft, nontender, nondistended. No hepatosplenomegaly. No bruits or masses. Good bowel sounds. Extremities: no cyanosis rash, edema + mild clubbing Neuro: alert & orientedx3, cranial nerves grossly intact. moves all 4 extremities w/o difficulty. Affect pleasant   Telemetry   NSR 80-90s, 3 beat run NSVT Personally reviewed   Labs    CBC Recent Labs    12/26/19 0347 12/27/19 0448  WBC 9.6 13.1*  HGB 8.6* 8.8*  HCT 27.7* 28.7*  MCV 84.2 84.7  PLT 137* 335   Basic Metabolic Panel Recent Labs    12/26/19 0340 12/26/19 0340 12/26/19 1606 12/26/19 2044 12/26/19 2321 12/27/19 0448  NA 135   < > 132*  --   --  133*  K 4.3   < > 4.3  --   --  4.7  CL 101   < > 98  --   --  99  CO2 27   < > 24  --   --  26  GLUCOSE 206*   < > 274*  --   --  225*  BUN 23   < >  22  --   --  33*  CREATININE 2.16*   < > 1.99*  --   --  2.99*  CALCIUM 7.5*   < > 7.5*  --   --  7.5*  MG 2.6*  --   --   --   --  2.4  PHOS <1.0*   < > 4.0   < > <1.0* 1.1*   < > = values in this interval not displayed.   Liver Function Tests Recent Labs    12/26/19 1606 12/27/19 0448  AST  --  169*  ALT  --  130*  ALKPHOS  --  307*  BILITOT  --  0.5  PROT  --  5.6*  ALBUMIN 2.4* 2.3*  2.3*   No results for input(s): LIPASE, AMYLASE in the last 72 hours. Cardiac Enzymes No results for input(s): CKTOTAL, CKMB, CKMBINDEX, TROPONINI in the last 72 hours.  BNP: BNP (last 3 results) No results for input(s): BNP in the last 8760 hours.  ProBNP (last 3 results) No results for input(s): PROBNP in the last 8760 hours.   D-Dimer No results for input(s): DDIMER in the last 72  hours. Hemoglobin A1C No results for input(s): HGBA1C in the last 72 hours. Fasting Lipid Panel No results for input(s): CHOL, HDL, LDLCALC, TRIG, CHOLHDL, LDLDIRECT in the last 72 hours. Thyroid Function Tests No results for input(s): TSH, T4TOTAL, T3FREE, THYROIDAB in the last 72 hours.  Invalid input(s): FREET3  Other results:   Imaging    No results found.   Medications:     Scheduled Medications: . amiodarone  200 mg Oral Daily  . apixaban  5 mg Oral BID  . Apremilast  1 tablet Oral Daily  . aspirin EC  81 mg Oral Daily  . atorvastatin  80 mg Oral Daily  . B-complex with vitamin C  1 tablet Per Tube Daily  . bisacodyl  10 mg Oral Daily   Or  . bisacodyl  10 mg Rectal Daily  . chlorhexidine gluconate (MEDLINE KIT)  15 mL Mouth Rinse BID  . Chlorhexidine Gluconate Cloth  6 each Topical Daily  . docusate sodium  200 mg Oral Daily  . feeding supplement (PROSource TF)  45 mL Per Tube BID  . insulin aspart  0-24 Units Subcutaneous Q4H  . pantoprazole  40 mg Oral Daily  . sodium chloride flush  3 mL Intravenous Q12H  . tacrolimus  1 mg Oral BID  . venlafaxine XR  37.5 mg Oral Daily    Infusions: .  prismasol BGK 4/2.5 Stopped (12/26/19 2030)  . sodium chloride Stopped (12/21/19 1602)  . dexmedetomidine (PRECEDEX) IV infusion Stopped (12/26/19 0947)  . feeding supplement (VITAL 1.5 CAL) 1,000 mL (12/27/19 0312)  . lactated ringers Stopped (12/19/19 1448)  . lactated ringers Stopped (12/20/19 1152)  . milrinone 0.25 mcg/kg/min (12/27/19 0500)  . prismasol BGK 2/2.5 replacement solution Stopped (12/26/19 2030)  . prismasol BGK 4/2.5 Stopped (12/26/19 2030)  . sodium phosphate  Dextrose 5% IVPB 44 mL/hr at 12/27/19 0500    PRN Medications: Gerhardt's butt cream, heparin, insulin aspart, LORazepam, melatonin, metoprolol tartrate, ondansetron (ZOFRAN) IV, oxyCODONE, senna-docusate, sodium chloride, sodium chloride flush    Assessment/Plan   1. CAD: Severe 3VD on  cath 7/21 => 60-70% ostial LAD, 80% mLAD, 95% ramus, 99% proximal LCx with TIMI-2 flow down the rest of the LCx, occluded distal RCA.  Suspect out of hospital inferolateral MI.  Cardiac MRI showed that the inferolateral wall is  likely not viable, but the remainder of the LV myocardium is likely viable. He had CABG 7/16 with LIMA-LAD, RIMA-ramus, seq SVG-PD/PL, SVG-D.  - Continue ASA/clopidogrel + atorvastatin.   - Stable no angina  - No b-blocker with shock 2. Acute on chronic systolic CHF: Echo with EF 20-25%, moderately decreased RV systolic function. Cardiac MRI with LV EF 26%, RV EF 38%, near full thickness scar in inferolateral wall suggesting lack of viability in the LCx territory.  Now post-CABG. Repeat limited echo 7/20 showed LVEF 20-25%, RV normal, no pericardial effusion. Remains anurinc. CVVHD stopped overnight. CVP 10 today.Co-ox 64% today on milrinone 0.25.  - Will wean milrinone at 0.125.  - Volume management per Renal. Pending iHD on Monday.  - Continue midodrine for BP support  3. Atrial fibrillation: Patient admitted with afib/RVR. Remains in NSR. S/p Maze + LAA clipping on 7/16.  - Now on amio 200 po daily. Continue - on Eliquis now. hgb stable at 8.8 4. AKI on CKD stage 3: Creatine has been stable 1.9-2.3 range in hospital pre-op.  Was 1.3 back in 4/21. Creatinine rose to 6.46 post-op. Suspect post-op ATN. CRRT initiated 7/19. Remains anuric - Management per Renal. Pending iHD on Monday.  5. Type 2 diabetes: SSI.  6. Liver transplant for HCV: tacrolimus currently on hold (? Toxicity, concomitate use w/ amiodarone can increase levels). On 7/18, level was 6.6 (not sure this was true trough) and within goal range for trough.  - He has been restarted on dose of tacrolimus in discussion with his transplant team.  - treated w/ stress dose steroids but discontinued yesterday (not on steroids at home), - no change 7. ID: 7/19 developed hypotension w/ increasing pressor support,  increasing leukocytosis and CXR w/ hazy opacities. Started on empiric abx for ? HCAP with vancomycin/cefepime => now off. BC NGTD. WBCs decreasing. - no change 8. Neuro: Alert/oriented today, improved.   Continue to mobilize with PT.   Glori Bickers, MD 12/27/2019 6:14 AM

## 2019-12-27 NOTE — Progress Notes (Signed)
Patient ID: Joseph Greer, male   DOB: 04-04-1956, 64 y.o.   MRN: [id number] EVENING ROUNDS NOTE :     [address]       [place] [address]             [phone]                 8 Days Post-Op Procedure(s) (LRB): CORONARY ARTERY BYPASS GRAFTING (CABG) TIMES  X 5,   USING BILATERAL MAMMARY ARTERY AND RIGHT LEG GREATER SAPHENOUS VEIN HARVESTED ENDOSCOPICALLY (N/A) TRANSESOPHAGEAL ECHOCARDIOGRAM (TEE) (N/A) INDOCYANINE GREEN FLUORESCENCE IMAGING (ICG) (N/A) MAZE (N/A) CLIPPING OF ATRIAL APPENDAGE USING ATRICURE CLIP SIZE 50MM (N/A)  Total Length of Stay:  LOS: 15 days  BP 120/71 (BP Location: Left Arm)   Pulse 85   Temp 98.4 F (36.9 C) (Oral)   Resp (!) 27   Ht 5\' 10"  (1.778 m)   Wt 85.9 kg   SpO2 96%   BMI 27.17 kg/m   .Intake/Output      07/24 0701 - 07/25 0700   P.O. 480   I.V. (mL/kg) 15 (0.2)   NG/GT 450   IV Piggyback 168.1   Total Intake(mL/kg) 1113.1 (13)   Urine (mL/kg/hr)    Other    Total Output    Net +1113.1       Stool Occurrence 1 x     . sodium chloride Stopped (12/21/19 1602)  . dexmedetomidine (PRECEDEX) IV infusion Stopped (12/26/19 0947)  . feeding supplement (VITAL 1.5 CAL) 1,000 mL (12/27/19 0312)  . lactated ringers Stopped (12/19/19 1448)  . lactated ringers Stopped (12/20/19 1152)  . milrinone Stopped (12/27/19 1145)     Lab Results  Component Value Date   WBC 13.1 (H) 12/27/2019   HGB 8.8 (L) 12/27/2019   HCT 28.7 (L) 12/27/2019   PLT 158 12/27/2019   GLUCOSE 173 (H) 12/27/2019   CHOL 219 (H) 12/12/2019   TRIG 117 12/12/2019   HDL 38 (L) 12/12/2019   LDLCALC 158 (H) 12/12/2019   ALT 130 (H) 12/27/2019   AST 169 (H) 12/27/2019   NA 134 (L) 12/27/2019   K 4.7 12/27/2019   CL 97 (L) 12/27/2019   CREATININE 3.70 (H) 12/27/2019   BUN 46 (H) 12/27/2019   CO2 24 12/27/2019   TSH 4.855 (H) 12/11/2019   INR 1.4 (H) 12/19/2019   HGBA1C 5.9 (H) 12/11/2019   Off cvvh Walked in unit  Cr 3.7    [name] MD  Beeper [phone] Office [phone] 12/27/2019 7:19 PM

## 2019-12-27 NOTE — Progress Notes (Signed)
eLink Physician-Brief Progress Note Patient Name: Joseph Greer DOB: 21-Apr-1956 MRN: [id number]   Date of Service  12/27/2019  HPI/Events of Note  Serum phosphorus < 1.0  eICU Interventions  45 mmol of phosphorus iv x 1 ordered.        [place] Anea Fodera 12/27/2019, 2:02 AM

## 2019-12-27 NOTE — Progress Notes (Signed)
Joseph Greer Admit Date: 12/11/2019 12/27/2019 Joseph Greer Requesting Physician:  [name] MD  Reason for Consult:  AKI on CKD3 SUBJECTIVE Off crrt, clotted last night ~830pm. Feels much better, got better sleep last night. Denies SOB (on RA), n/v, chest pain. Still anuric. On milrinone   Creatinine, Ser (mg/dL)  Date Value  12/27/2019 2.99 (H)  12/26/2019 1.99 (H)  12/26/2019 2.16 (H)  12/25/2019 2.48 (H)  12/25/2019 2.54 (H)  12/24/2019 2.61 (H)  12/24/2019 3.01 (H)  12/23/2019 3.24 (H)  12/23/2019 4.07 (H)  12/22/2019 6.46 (H)  ] I/Os: I/O last 3 completed shifts: In: 2969.7 [P.O.:120; I.V.:272.8; NG/GT:2220; IV Piggyback:356.9] Out: 2280 [Other:2280]   ROS Balance of 12 systems is negative w/ exceptions as above  PMH  Past Medical History:  Diagnosis Date   Anxiety    CKD (chronic kidney disease), stage III    Depression    Diabetes mellitus type 2, noninsulin dependent (HCC)    Hepatitis C    Hypertension    Psoriasis    Status post liver transplant ([place])    [place]  Past Surgical History:  Procedure Laterality Date   CLIPPING OF ATRIAL APPENDAGE N/A 12/19/2019   Procedure: CLIPPING OF ATRIAL APPENDAGE USING ATRICURE CLIP SIZE 50MM;  Surgeon: Wonda Olds, MD;  Location: [place];  Service: Open Heart Surgery;  Laterality: N/A;   COLONOSCOPY     COLONOSCOPY WITH PROPOFOL N/A 06/01/2017   Procedure: COLONOSCOPY WITH PROPOFOL;  Surgeon: Rogene Houston, MD;  [facility];  Service: Endoscopy;  Laterality: N/A;  7:30   CORONARY ARTERY BYPASS GRAFT N/A 12/19/2019   Procedure: CORONARY ARTERY BYPASS GRAFTING (CABG) TIMES  X 5,   USING BILATERAL MAMMARY ARTERY AND RIGHT LEG GREATER SAPHENOUS VEIN HARVESTED ENDOSCOPICALLY;  Surgeon: Wonda Olds, MD;  Location: [place];  Service: Open Heart Surgery;  Laterality: N/A;   HERNIA REPAIR Right    LEFT HEART CATH AND CORONARY ANGIOGRAPHY N/A 12/12/2019   Procedure: LEFT HEART CATH AND CORONARY  ANGIOGRAPHY;  Surgeon: Jettie Booze, MD;  Location: [place] [facility];  Service: Cardiovascular;  Laterality: N/A;   LIVER BIOPSY     LIVER SURGERY     MAZE N/A 12/19/2019   Procedure: MAZE;  Surgeon: Wonda Olds, MD;  Location: [place];  Service: Open Heart Surgery;  Laterality: N/A;   POLYPECTOMY  06/01/2017   Procedure: POLYPECTOMY;  Surgeon: Rogene Houston, MD;  [facility];  Service: Endoscopy;;  polyp at sigmoid colon x2, ascending colon polyp, hepatic flexure polyp   RIGHT HEART CATH N/A 12/17/2019   Procedure: RIGHT HEART CATH;  Surgeon: Larey Dresser, MD;  Location: [place] [facility];  Service: Cardiovascular;  Laterality: N/A;   TEE WITHOUT CARDIOVERSION N/A 12/19/2019   Procedure: TRANSESOPHAGEAL ECHOCARDIOGRAM (TEE);  Surgeon: Wonda Olds, MD;  Location: [place];  Service: Open Heart Surgery;  Laterality: N/A;   UPPER GASTROINTESTINAL ENDOSCOPY     FH  Family History  Problem Relation Age of Onset   Diabetes Mother    Heart disease Father    Heart disease Son    [place]  reports that he quit smoking about 12 years ago. His smoking use included cigarettes. He smoked 1.00 pack per day. He has never used smokeless tobacco. He reports that he does not drink alcohol and does not use drugs. Allergies No Known Allergies Home medications Prior to Admission medications   Medication Sig Start Date End Date Taking?  Authorizing Provider  amLODipine (NORVASC) 5 MG tablet Take 5 mg by mouth daily. 10/28/19  Yes [provider]  Apremilast (OTEZLA) 30 MG TABS Take 1 tablet by mouth 2 (two) times daily. 06/16/19  Yes [provider]  LORazepam (ATIVAN) 1 MG tablet Take 1 mg by mouth 2 (two) times daily as needed (panic attacks).  11/28/19  Yes [provider]  nitroGLYCERIN (NITROSTAT) 0.4 MG SL tablet Place 1 tablet (0.4 mg total) under the tongue every 5 (five) minutes x 3 doses as needed for chest pain. 03/27/13  Yes Rai,  Ripudeep K, MD  tacrolimus (PROGRAF) 1 MG capsule Take 2 mg by mouth 2 (two) times daily.    Yes [provider]  venlafaxine XR (EFFEXOR-XR) 75 MG 24 hr capsule Take 75 mg by mouth daily.   Yes [provider]  buPROPion (WELLBUTRIN) 75 MG tablet Take 75 mg by mouth 2 (two) times daily.  Patient not taking: Reported on 12/11/2019    [provider]  FLUoxetine (PROZAC) 40 MG capsule Take 40 mg by mouth daily. Patient not taking: Reported on 12/11/2019    [provider]  lisinopril (PRINIVIL,ZESTRIL) 20 MG tablet Take 20 mg by mouth daily. Patient not taking: Reported on 12/11/2019    [provider]  mycophenolate (CELLCEPT) 250 MG capsule Take 250 mg by mouth 2 (two) times daily.  Patient not taking: Reported on 12/11/2019    [provider]  triamcinolone ointment (KENALOG) 0.1 % Apply 1 application topically 2 (two) times daily. Patient not taking: Reported on 12/11/2019    [provider]    Current Medications Scheduled Meds:  amiodarone  200 mg Oral Daily   apixaban  5 mg Oral BID   Apremilast  1 tablet Oral Daily   aspirin EC  81 mg Oral Daily   atorvastatin  80 mg Oral Daily   B-complex with vitamin C  1 tablet Per Tube Daily   bisacodyl  10 mg Oral Daily   Or   bisacodyl  10 mg Rectal Daily   chlorhexidine gluconate (MEDLINE KIT)  15 mL Mouth Rinse BID   Chlorhexidine Gluconate Cloth  6 each Topical Daily   docusate sodium  200 mg Oral Daily   feeding supplement (PROSource TF)  45 mL Per Tube BID   insulin aspart  0-24 Units Subcutaneous Q4H   insulin detemir  5 Units Subcutaneous BID   pantoprazole  40 mg Oral Daily   sodium chloride flush  3 mL Intravenous Q12H   tacrolimus  1 mg Oral BID   venlafaxine XR  37.5 mg Oral Daily   Continuous Infusions:   prismasol BGK 4/2.5 Stopped (12/26/19 2030)   sodium chloride Stopped (12/21/19 1602)   dexmedetomidine (PRECEDEX) IV infusion Stopped  (12/26/19 0947)   feeding supplement (VITAL 1.5 CAL) 1,000 mL (12/27/19 0312)   lactated ringers Stopped (12/19/19 1448)   lactated ringers Stopped (12/20/19 1152)   milrinone 0.125 mcg/kg/min (12/27/19 0700)   prismasol BGK 2/2.5 replacement solution Stopped (12/26/19 2030)   prismasol BGK 4/2.5 Stopped (12/26/19 2030)   sodium phosphate  Dextrose 5% IVPB 44 mL/hr at 12/27/19 0700   PRN Meds:.Gerhardt's butt cream, heparin, insulin aspart, LORazepam, melatonin, metoprolol tartrate, ondansetron (ZOFRAN) IV, oxyCODONE, senna-docusate, sodium chloride, sodium chloride flush  CBC Recent Labs  Lab 12/25/19 0429 12/26/19 0347 12/27/19 0448  WBC 11.3* 9.6 13.1*  HGB 9.1* 8.6* 8.8*  HCT 28.9* 27.7* 28.7*  MCV 84.0 84.2 84.7  PLT 168  137* 015   Basic Metabolic Panel Recent Labs  Lab 12/24/19 0433 12/24/19 0433 12/24/19 1627 12/24/19 1627 12/25/19 0429 12/25/19 1604 12/26/19 0340 12/26/19 1606 12/26/19 2044 12/26/19 2321 12/27/19 0448  NA 135  --  136  --  133* 133* 135 132*  --   --  133*  K 4.8  --  4.4  --  4.4 4.0 4.3 4.3  --   --  4.7  CL 101  --  100  --  99 99 101 98  --   --  99  CO2 24  --  27  --  24 25 27 24   --   --  26  GLUCOSE 235*  --  190*  --  223* 220* 206* 274*  --   --  225*  BUN 30*  --  30*  --  27* 27* 23 22  --   --  33*  CREATININE 3.01*  --  2.61*  --  2.54* 2.48* 2.16* 1.99*  --   --  2.99*  CALCIUM 8.0*  --  7.9*  --  7.9* 7.6* 7.5* 7.5*  --   --  7.5*  PHOS 2.9   < > 2.2*   < > 1.5* 1.2* <1.0* 4.0 1.1* <1.0* 1.1*   < > = values in this interval not displayed.    Physical Exam  Blood pressure (!) 93/57, pulse 89, temperature 97.6 F (36.4 C), temperature source Oral, resp. rate 17, height 5' 10"  (1.778 m), weight 85.9 kg, SpO2 96 %. GEN: NAD, awake Neck: LIJ temp dialysis catheter: c/d/i CV: rrr, normal S1-S2 PULM: on ra, unlabored, cta bl, no overt w/r/r/c ABD: Soft, nontender nondistended SKIN: No rashes or lesions , midchest wall  incision c/d/i EXT: no edema NEURO: Speech clear and coherent, awake, following commands   Assessment 86M with oliguric AoCKD3 in setting of LHF 7/9 and 5V CABG 7/16 + MAZE.  Suspect this is ATN following cardiac surgery  1. AoCKD3 (now anuric), BL SCr 1.2 to 1.5, no outpt nephrology; suspect ATN; exhibiting early uremic symptoms with loss of appetite along with nausea/vomiting (Cr continues to rise). No response in urine output with diuretics initially. Started CRRT on 12/22/2019 for uremic symptoms and volume. 2. NSTEMI status post 5 vessel CABG 7/16 3. Atrial fibrillation status post maze with left atrial appendage clipping 7/16.  On amiodarone 4. Acute on chronic sCHF/cardiogenic shock on epinephrine and milrinone 5. Status post liver transplant 2009 ([facility]) on tacrolimus followed by [facility] 6. Hypertension 7. DM2 8. Delirium, critical care delirium?  Plan 1. Will hold off on restarting CRRT 2. Will reassess intermittent hemodialysis needs moving forward provided he remains hemodynamically stable. Will tentatively plan for HD treatment on Monday. 3. Will continue to monitor for renal recovery moving forward 4. Daily weights, Strict I/Os, Avoid nephrotoxins (NSAIDs, judicious IV Contrast)   Joseph Quint, MD [facility] 12/27/2019, 10:32 AM

## 2019-12-27 NOTE — Progress Notes (Signed)
Called [place] and made Kathlee Nations, RN aware that patient is complaining that he still can not sleep and wants something to help him sleep. Kathlee Nations, RN to ask [facility] MD.

## 2019-12-27 NOTE — Progress Notes (Signed)
NAME:  Joseph Greer, MRN:  [id number], DOB:  Jun 28, 1955, LOS: 13 ADMISSION DATE:  12/11/2019, CONSULTATION DATE:  12/22/2019 REFERRING MD:  Orvan Seen, CHIEF COMPLAINT:  shock   HPI/course in hospital  64 year old man who was admitted for NSTEMI and was found to have severe biventricular failure and 3V CAD.  EF was 25%  Prior liver transplant at [facility].   7/16 - CABGx5 with Maze for Afib.   Post operative course complicated by persistent LV dysfunction and AKI.   CRRT initiated 7/19.   Past Medical History   Past Medical History:  Diagnosis Date  . Anxiety   . CKD (chronic kidney disease), stage III   . Depression   . Diabetes mellitus type 2, noninsulin dependent ([place])   . Hepatitis C   . Hypertension   . Psoriasis   . Status post liver transplant [facility])      Past Surgical History:  Procedure Laterality Date  . CLIPPING OF ATRIAL APPENDAGE N/A 12/19/2019   Procedure: CLIPPING OF ATRIAL APPENDAGE USING ATRICURE CLIP SIZE 50MM;  Surgeon: Wonda Olds, MD;  Location: [place];  Service: Open Heart Surgery;  Laterality: N/A;  . COLONOSCOPY    . COLONOSCOPY WITH PROPOFOL N/A 06/01/2017   Procedure: COLONOSCOPY WITH PROPOFOL;  Surgeon: Rogene Houston, MD;  [facility];  Service: Endoscopy;  Laterality: N/A;  7:30  . CORONARY ARTERY BYPASS GRAFT N/A 12/19/2019   Procedure: CORONARY ARTERY BYPASS GRAFTING (CABG) TIMES  X 5,   USING BILATERAL MAMMARY ARTERY AND RIGHT LEG GREATER SAPHENOUS VEIN HARVESTED ENDOSCOPICALLY;  Surgeon: Wonda Olds, MD;  Location: [place];  Service: Open Heart Surgery;  Laterality: N/A;  . HERNIA REPAIR Right   . LEFT HEART CATH AND CORONARY ANGIOGRAPHY N/A 12/12/2019   Procedure: LEFT HEART CATH AND CORONARY ANGIOGRAPHY;  Surgeon: Jettie Booze, MD;  Location: [place] [facility];  Service: Cardiovascular;  Laterality: N/A;  . LIVER BIOPSY    . LIVER SURGERY    . MAZE N/A 12/19/2019   Procedure: MAZE;  Surgeon: Wonda Olds,  MD;  Location: [place];  Service: Open Heart Surgery;  Laterality: N/A;  . POLYPECTOMY  06/01/2017   Procedure: POLYPECTOMY;  Surgeon: Rogene Houston, MD;  [facility];  Service: Endoscopy;;  polyp at sigmoid colon x2, ascending colon polyp, hepatic flexure polyp  . RIGHT HEART CATH N/A 12/17/2019   Procedure: RIGHT HEART CATH;  Surgeon: Larey Dresser, MD;  Location: [place] [facility];  Service: Cardiovascular;  Laterality: N/A;  . TEE WITHOUT CARDIOVERSION N/A 12/19/2019   Procedure: TRANSESOPHAGEAL ECHOCARDIOGRAM (TEE);  Surgeon: Wonda Olds, MD;  Location: [place];  Service: Open Heart Surgery;  Laterality: N/A;  . UPPER GASTROINTESTINAL ENDOSCOPY        Interim history/subjective:  Improved mentation. Appetite coming back. Able to eat 90% of lunch. Feels less bloated today.  Objective   Blood pressure 120/71, pulse 85, temperature 98.4 F (36.9 C), temperature source Oral, resp. rate (!) 27, height 5\' 10"  (1.778 m), weight 85.9 kg, SpO2 96 %. CVP:  [10 mmHg-15 mmHg] 15 mmHg      Intake/Output Summary (Last 24 hours) at 12/27/2019 1718 Last data filed at 12/27/2019 1200 Gross per 24 hour  Intake 2136.49 ml  Output 421 ml  Net 1715.49 ml   Filed Weights   12/25/19 0500 12/26/19 0500 12/27/19 0459  Weight: 84.1 kg 84.4 kg 85.9 kg   Examinations:  General: NAD, resting in bed HE: Normocephalic, atraumatic , EOMI, Conjunctivae normal ENT: No congestion, no rhinorrhea, no exudate or erythema  Cardiovascular: Normal rate, regular rhythm.  No murmurs, rubs, or gallops Pulmonary : Effort normal, breath sounds normal. No wheezes, rales, or rhonchi Abdominal: soft, nontender,  bowel sounds present Musculoskeletal: no swelling , deformity, injury ,or tenderness in extremities Skin: Warm, dry , no bruising, erythema, or rash Psychiatric/Behavioral:  anxious mood, anxious behavior  Neuro: Alert and oriented x 4  Ancillary tests (personally reviewed)  CBC: Recent  Labs  Lab 12/23/19 0436 12/24/19 0433 12/25/19 0429 12/26/19 0347 12/27/19 0448  WBC 18.2* 13.9* 11.3* 9.6 13.1*  HGB 8.7* 8.6* 9.1* 8.6* 8.8*  HCT 27.6* 27.4* 28.9* 27.7* 28.7*  MCV 84.1 83.8 84.0 84.2 84.7  PLT 156 156 168 137* 485    Basic Metabolic Panel: Recent Labs  Lab 12/23/19 1544 12/23/19 1544 12/24/19 0433 12/24/19 1627 12/25/19 0429 12/25/19 0429 12/25/19 1604 12/25/19 1604 12/26/19 0340 12/26/19 0340 12/26/19 1606 12/26/19 2044 12/26/19 2321 12/27/19 0448 12/27/19 1600  NA 134*   < > 135   < > 133*   < > 133*  --  135  --  132*  --   --  133* 134*  K 4.8   < > 4.8   < > 4.4   < > 4.0  --  4.3  --  4.3  --   --  4.7 4.7  CL 99   < > 101   < > 99   < > 99  --  101  --  98  --   --  99 97*  CO2 24   < > 24   < > 24   < > 25  --  27  --  24  --   --  26 24  GLUCOSE 240*   < > 235*   < > 223*   < > 220*  --  206*  --  274*  --   --  225* 173*  BUN 29*   < > 30*   < > 27*   < > 27*  --  23  --  22  --   --  33* 46*  CREATININE 3.24*   < > 3.01*   < > 2.54*   < > 2.48*  --  2.16*  --  1.99*  --   --  2.99* 3.70*  CALCIUM 7.8*   < > 8.0*   < > 7.9*   < > 7.6*  --  7.5*  --  7.5*  --   --  7.5* 7.2*  MG 2.6*  --  2.6*  --  2.6*  --   --   --  2.6*  --   --   --   --  2.4  --   PHOS 3.8   < > 2.9   < > 1.5*   < > 1.2*   < > <1.0*   < > 4.0 1.1* <1.0* 1.1* 7.5*   < > = values in this interval not displayed.   GFR: Estimated Creatinine Clearance: 21.4 mL/min (A) (by C-G formula based on SCr of 3.7 mg/dL (H)). Recent Labs  Lab 12/22/19 1548 12/22/19 1845 12/23/19 0436 12/24/19 0433 12/25/19 0429 12/26/19 0347 12/27/19 0448  WBC  --   --    < > 13.9* 11.3* 9.6 13.1*  LATICACIDVEN 4.9* 2.8*  --   --   --   --   --    < > =  values in this interval not displayed.    Liver Function Tests: Recent Labs  Lab 12/22/19 1530 12/23/19 0436 12/25/19 1604 12/26/19 0340 12/26/19 1606 12/27/19 0448 12/27/19 1600  AST 31  --   --   --   --  169*  --   ALT 20  --    --   --   --  130*  --   ALKPHOS 66  --   --   --   --  307*  --   BILITOT 0.9  --   --   --   --  0.5  --   PROT 5.4*  --   --   --   --  5.6*  --   ALBUMIN 2.3*   < > 2.4* 2.3* 2.4* 2.3*  2.3* 2.4*   < > = values in this interval not displayed.   No results for input(s): LIPASE, AMYLASE in the last 168 hours. No results for input(s): AMMONIA in the last 168 hours.  ABG    Component Value Date/Time   PHART 7.241 (L) 12/22/2019 1411   PCO2ART 42.6 12/22/2019 1411   PO2ART 42 (L) 12/22/2019 1411   HCO3 18.3 (L) 12/22/2019 1411   TCO2 20 (L) 12/22/2019 1411   ACIDBASEDEF 8.0 (H) 12/22/2019 1411   O2SAT 56.6 12/27/2019 1115     Coagulation Profile: No results for input(s): INR, PROTIME in the last 168 hours.  Cardiac Enzymes: No results for input(s): CKTOTAL, CKMB, CKMBINDEX, TROPONINI in the last 168 hours.  HbA1C: Hgb A1c MFr Bld  Date/Time Value Ref Range Status  12/11/2019 09:40 PM 5.9 (H) 4.8 - 5.6 % Final    Comment:    (NOTE) Pre diabetes:          5.7%-6.4%  Diabetes:              >6.4%  Glycemic control for   <7.0% adults with diabetes   03/26/2013 10:30 PM 5.8 (H) <5.7 % Final    Comment:    (NOTE)                                                                       According to the ADA Clinical Practice Recommendations for 2011, when HbA1c is used as a screening test:  >=6.5%   Diagnostic of Diabetes Mellitus           (if abnormal result is confirmed) 5.7-6.4%   Increased risk of developing Diabetes Mellitus References:Diagnosis and Classification of Diabetes Mellitus,Diabetes MGQQ,7619,50(DTOIZ 1):S62-S69 and Standards of Medical Care in         Diabetes - 2011,Diabetes Care,2011,34 (Suppl 1):S11-S61.    CBG: Recent Labs  Lab 12/26/19 2322 12/27/19 0455 12/27/19 0641 12/27/19 1136 12/27/19 1536  GLUCAP 172* 212* 190* 159* 182*    Assessment & Plan:   CAD with ischemic cardiomyopathy S/P CABGx5.  Co-ox 56% today on Milrinone .125.   -  Stop milrinone today.  - Follow ScvO2  Atrial Fibrillation s/p MAZE Sinus rhythm today. On IV Amio and in NSR.  - Continue PO amio 200 daily - Continue Eliquis   AKI requiring CRRT CKD stage III  CRRT at 50 ml/hr, remains anuric  - Transition to intermittent HD per nephrology.  Status post liver transplant - Tacrolimus levels are not back.Restarted on half home dose. HF Pharmacy coordinating with transplant center.  Plan: - Tacro 1mg  BID - Follow up Tacro level from 7/21 - Follow up recommendations on Tacro dosing   Steroid induced hyperglycemia Now off steroids . Blood glucose improving. Will need to stop tube feeds at some point to encourage PO intake. Will start with giving regular diet today with fluid restrictions ( will not eat current diet) and tube feeds at night.  Plan: - No Basal regimen at this time - 3 units with tube feeds. - SSI for bolus - monitor CBG  Hospital Delirium  Anxiety/depression Patient lost his wife a year and half ago. Reports anxiety and depression at baseline now. His current health condition has worsened his anxiety. His hallucination went away with Precedex used for sleep overnight. He reports he didn't sleep well, but  RN observed his sleep was better. Will continue Precedex at night and his home ativan during the day. Also Effexor.  Plan: Effexor daily PRN Ativan BID Precedex qhs for sleep  Daily Goals Checklist  Pain/Anxiety/Delirium protocol (if indicated): oral meds DVT prophylaxis: apixiban Nutritional status and feeding goals: pull NGT tomorrow if intake keeps up.  Fluid status goals: transition to iHD Urinary catheter: no catheter Central lines: right introducer, left HD cath Glucose control: euglycemic Mobility/therapy needs: OOB to chair Daily labs: RFP, MG Code Status: full Family Communication: per cardiac surgery Disposition: ICU  Kipp Brood, MD [facility] ICU Physician [place]  Pager:  [phone] Mobile: [phone] After hours: [phone].  12/27/2019, 5:24 PM

## 2019-12-27 NOTE — Progress Notes (Signed)
Patient ID: Joseph Greer, male   DOB: 1955/09/02, 64 y.o.   MRN: [id number] TCTS DAILY ICU PROGRESS NOTE                   [address]            [place] [address]          [phone]   8 Days Post-Op Procedure(s) (LRB): CORONARY ARTERY BYPASS GRAFTING (CABG) TIMES  X 5,   USING BILATERAL MAMMARY ARTERY AND RIGHT LEG GREATER SAPHENOUS VEIN HARVESTED ENDOSCOPICALLY (N/A) TRANSESOPHAGEAL ECHOCARDIOGRAM (TEE) (N/A) INDOCYANINE GREEN FLUORESCENCE IMAGING (ICG) (N/A) MAZE (N/A) CLIPPING OF ATRIAL APPENDAGE USING ATRICURE CLIP SIZE 50MM (N/A)  Total Length of Stay:  LOS: 15 days   Subjective: Patient is awake alert feels better this morning.  CVVH stopped last night  Objective: Vital signs in last 24 hours: Temp:  [97.6 F (36.4 C)-98.8 F (37.1 C)] 97.6 F (36.4 C) (07/24 0815) Pulse Rate:  [80-92] 89 (07/24 0700) Cardiac Rhythm: Normal sinus rhythm (07/24 0400) Resp:  [15-45] 17 (07/24 0700) BP: (93-121)/(51-67) 93/57 (07/24 0700) SpO2:  [95 %-100 %] 96 % (07/24 0700) Weight:  [85.9 kg] 85.9 kg (07/24 0459)  Filed Weights   12/25/19 0500 12/26/19 0500 12/27/19 0459  Weight: 84.1 kg 84.4 kg 85.9 kg    Weight change: 1.5 kg   Hemodynamic parameters for last 24 hours: CVP:  [11 mmHg-14 mmHg] 11 mmHg  Intake/Output from previous day: 07/23 0701 - 07/24 0700 In: 2072.2 [P.O.:120; I.V.:155.3; NG/GT:1440; IV Piggyback:356.9] Out: 1735   Intake/Output this shift: No intake/output data recorded.  Current Meds: Scheduled Meds: . amiodarone  200 mg Oral Daily  . apixaban  5 mg Oral BID  . Apremilast  1 tablet Oral Daily  . aspirin EC  81 mg Oral Daily  . atorvastatin  80 mg Oral Daily  . B-complex with vitamin C  1 tablet Per Tube Daily  . bisacodyl  10 mg Oral Daily   Or  . bisacodyl  10 mg Rectal Daily  . chlorhexidine gluconate (MEDLINE KIT)  15 mL Mouth Rinse BID  . Chlorhexidine Gluconate Cloth  6 each Topical Daily  . docusate sodium   200 mg Oral Daily  . feeding supplement (PROSource TF)  45 mL Per Tube BID  . insulin aspart  0-24 Units Subcutaneous Q4H  . pantoprazole  40 mg Oral Daily  . sodium chloride flush  3 mL Intravenous Q12H  . tacrolimus  1 mg Oral BID  . venlafaxine XR  37.5 mg Oral Daily   Continuous Infusions: .  prismasol BGK 4/2.5 Stopped (12/26/19 2030)  . sodium chloride Stopped (12/21/19 1602)  . dexmedetomidine (PRECEDEX) IV infusion Stopped (12/26/19 0947)  . feeding supplement (VITAL 1.5 CAL) 1,000 mL (12/27/19 0312)  . lactated ringers Stopped (12/19/19 1448)  . lactated ringers Stopped (12/20/19 1152)  . milrinone 0.125 mcg/kg/min (12/27/19 0700)  . prismasol BGK 2/2.5 replacement solution Stopped (12/26/19 2030)  . prismasol BGK 4/2.5 Stopped (12/26/19 2030)  . sodium phosphate  Dextrose 5% IVPB 44 mL/hr at 12/27/19 0700   PRN Meds:.Gerhardt's butt cream, heparin, insulin aspart, LORazepam, melatonin, metoprolol tartrate, ondansetron (ZOFRAN) IV, oxyCODONE, senna-docusate, sodium chloride, sodium chloride flush  General appearance: alert and cooperative Neurologic: intact Heart: regular rate and rhythm Lungs: clear to auscultation bilaterally Abdomen: soft, non-tender; bowel sounds normal; no masses,  no organomegaly Wound: Sternum intact regenerated doing  Lab Results: CBC: Recent Labs  12/26/19 0347 12/27/19 0448  WBC 9.6 13.1*  HGB 8.6* 8.8*  HCT 27.7* 28.7*  PLT 137* 158   BMET:  Recent Labs    12/26/19 1606 12/27/19 0448  NA 132* 133*  K 4.3 4.7  CL 98 99  CO2 24 26  GLUCOSE 274* 225*  BUN 22 33*  CREATININE 1.99* 2.99*  CALCIUM 7.5* 7.5*    CMET: Lab Results  Component Value Date   WBC 13.1 (H) 12/27/2019   HGB 8.8 (L) 12/27/2019   HCT 28.7 (L) 12/27/2019   PLT 158 12/27/2019   GLUCOSE 225 (H) 12/27/2019   CHOL 219 (H) 12/12/2019   TRIG 117 12/12/2019   HDL 38 (L) 12/12/2019   LDLCALC 158 (H) 12/12/2019   ALT 130 (H) 12/27/2019   AST 169 (H)  12/27/2019   NA 133 (L) 12/27/2019   K 4.7 12/27/2019   CL 99 12/27/2019   CREATININE 2.99 (H) 12/27/2019   BUN 33 (H) 12/27/2019   CO2 26 12/27/2019   TSH 4.855 (H) 12/11/2019   INR 1.4 (H) 12/19/2019   HGBA1C 5.9 (H) 12/11/2019      PT/INR: No results for input(s): LABPROT, INR in the last 72 hours. Radiology: No results found.   Assessment/Plan: S/P Procedure(s) (LRB): CORONARY ARTERY BYPASS GRAFTING (CABG) TIMES  X 5,   USING BILATERAL MAMMARY ARTERY AND RIGHT LEG GREATER SAPHENOUS VEIN HARVESTED ENDOSCOPICALLY (N/A) TRANSESOPHAGEAL ECHOCARDIOGRAM (TEE) (N/A) INDOCYANINE GREEN FLUORESCENCE IMAGING (ICG) (N/A) MAZE (N/A) CLIPPING OF ATRIAL APPENDAGE USING ATRICURE CLIP SIZE 50MM (N/A) Mobilize Stay off of [facility] and monitor urine output-need for dialysis Wean milrinone down    [name] 12/27/2019 8:23 AM

## 2019-12-27 NOTE — Progress Notes (Signed)
eLink Physician-Brief Progress Note Patient Name: Joseph Greer DOB: 1955-09-29 MRN: [id number]   Date of Service  12/27/2019  HPI/Events of Note  Patient reportedly not able  to sleep despite Melatonin 3 mg po x 1 last night. When I went into his room he was resting quietly.  eICU Interventions  No intervention at this time.        Kerry Kass Kalianna Verbeke 12/27/2019, 4:28 AM

## 2019-12-28 LAB — COMPREHENSIVE METABOLIC PANEL
ALT: 100 U/L — ABNORMAL HIGH (ref 0–44)
AST: 71 U/L — ABNORMAL HIGH (ref 15–41)
Albumin: 2.4 g/dL — ABNORMAL LOW (ref 3.5–5.0)
Alkaline Phosphatase: 278 U/L — ABNORMAL HIGH (ref 38–126)
Anion gap: 12 (ref 5–15)
BUN: 52 mg/dL — ABNORMAL HIGH (ref 8–23)
CO2: 23 mmol/L (ref 22–32)
Calcium: 7.3 mg/dL — ABNORMAL LOW (ref 8.9–10.3)
Chloride: 98 mmol/L (ref 98–111)
Creatinine, Ser: 4.37 mg/dL — ABNORMAL HIGH (ref 0.61–1.24)
GFR calc Af Amer: 16 mL/min — ABNORMAL LOW (ref >60)
GFR calc non Af Amer: 14 mL/min — ABNORMAL LOW (ref >60)
Glucose, Bld: 109 mg/dL — ABNORMAL HIGH (ref 70–99)
Potassium: 4.7 mmol/L (ref 3.5–5.1)
Sodium: 133 mmol/L — ABNORMAL LOW (ref 135–145)
Total Bilirubin: 0.7 mg/dL (ref 0.3–1.2)
Total Protein: 5.9 g/dL — ABNORMAL LOW (ref 6.5–8.1)

## 2019-12-28 LAB — RENAL FUNCTION PANEL
Albumin: 2.3 g/dL — ABNORMAL LOW (ref 3.5–5.0)
Anion gap: 11 (ref 5–15)
BUN: 60 mg/dL — ABNORMAL HIGH (ref 8–23)
CO2: 23 mmol/L (ref 22–32)
Calcium: 7.1 mg/dL — ABNORMAL LOW (ref 8.9–10.3)
Chloride: 100 mmol/L (ref 98–111)
Creatinine, Ser: 5.01 mg/dL — ABNORMAL HIGH (ref 0.61–1.24)
GFR calc Af Amer: 13 mL/min — ABNORMAL LOW (ref >60)
GFR calc non Af Amer: 11 mL/min — ABNORMAL LOW (ref >60)
Glucose, Bld: 143 mg/dL — ABNORMAL HIGH (ref 70–99)
Phosphorus: 7.1 mg/dL — ABNORMAL HIGH (ref 2.5–4.6)
Potassium: 5.1 mmol/L (ref 3.5–5.1)
Sodium: 134 mmol/L — ABNORMAL LOW (ref 135–145)

## 2019-12-28 LAB — COOXEMETRY PANEL
Carboxyhemoglobin: 1.7 % — ABNORMAL HIGH (ref 0.5–1.5)
Methemoglobin: 1.2 % (ref 0.0–1.5)
O2 Saturation: 59.5 %
Total hemoglobin: 9.5 g/dL — ABNORMAL LOW (ref 12.0–16.0)

## 2019-12-28 LAB — CBC
HCT: 29 % — ABNORMAL LOW (ref 39.0–52.0)
Hemoglobin: 9 g/dL — ABNORMAL LOW (ref 13.0–17.0)
MCH: 25.9 pg — ABNORMAL LOW (ref 26.0–34.0)
MCHC: 31 g/dL (ref 30.0–36.0)
MCV: 83.6 fL (ref 80.0–100.0)
Platelets: 186 10*3/uL (ref 150–400)
RBC: 3.47 MIL/uL — ABNORMAL LOW (ref 4.22–5.81)
RDW: 16.5 % — ABNORMAL HIGH (ref 11.5–15.5)
WBC: 16.5 10*3/uL — ABNORMAL HIGH (ref 4.0–10.5)
nRBC: 0.1 % (ref 0.0–0.2)

## 2019-12-28 LAB — GLUCOSE, CAPILLARY
Glucose-Capillary: 100 mg/dL — ABNORMAL HIGH (ref 70–99)
Glucose-Capillary: 100 mg/dL — ABNORMAL HIGH (ref 70–99)
Glucose-Capillary: 109 mg/dL — ABNORMAL HIGH (ref 70–99)
Glucose-Capillary: 135 mg/dL — ABNORMAL HIGH (ref 70–99)
Glucose-Capillary: 137 mg/dL — ABNORMAL HIGH (ref 70–99)
Glucose-Capillary: 141 mg/dL — ABNORMAL HIGH (ref 70–99)
Glucose-Capillary: 156 mg/dL — ABNORMAL HIGH (ref 70–99)

## 2019-12-28 LAB — MAGNESIUM: Magnesium: 2.5 mg/dL — ABNORMAL HIGH (ref 1.7–2.4)

## 2019-12-28 MED ORDER — CEFAZOLIN SODIUM-DEXTROSE 2-4 GM/100ML-% IV SOLN: 2.0000 g | Freq: Once | INTRAVENOUS | Status: DC

## 2019-12-28 NOTE — Progress Notes (Signed)
NAME:  Joseph Greer, MRN:  [id number], DOB:  Mar 28, 1956, LOS: 74 ADMISSION DATE:  12/11/2019, CONSULTATION DATE:  12/22/2019 REFERRING MD:  Orvan Seen, CHIEF COMPLAINT:  shock   HPI/course in hospital  64 year old man who was admitted for NSTEMI and was found to have severe biventricular failure and 3V CAD.  EF was 25%  Prior liver transplant at [facility] 2009.   7/16 - CABGx5 with Maze for Afib.   Post operative course complicated by persistent LV dysfunction and AKI.   CRRT initiated 7/19.   Past Medical History   Past Medical History:  Diagnosis Date  . Anxiety   . CKD (chronic kidney disease), stage III   . Depression   . Diabetes mellitus type 2, noninsulin dependent ([place])   . Hepatitis C   . Hypertension   . Psoriasis   . Status post liver transplant [facility])      Past Surgical History:  Procedure Laterality Date  . CLIPPING OF ATRIAL APPENDAGE N/A 12/19/2019   Procedure: CLIPPING OF ATRIAL APPENDAGE USING ATRICURE CLIP SIZE 50MM;  Surgeon: Wonda Olds, MD;  Location: [place];  Service: Open Heart Surgery;  Laterality: N/A;  . COLONOSCOPY    . COLONOSCOPY WITH PROPOFOL N/A 06/01/2017   Procedure: COLONOSCOPY WITH PROPOFOL;  Surgeon: Rogene Houston, MD;  [facility];  Service: Endoscopy;  Laterality: N/A;  7:30  . CORONARY ARTERY BYPASS GRAFT N/A 12/19/2019   Procedure: CORONARY ARTERY BYPASS GRAFTING (CABG) TIMES  X 5,   USING BILATERAL MAMMARY ARTERY AND RIGHT LEG GREATER SAPHENOUS VEIN HARVESTED ENDOSCOPICALLY;  Surgeon: Wonda Olds, MD;  Location: [place];  Service: Open Heart Surgery;  Laterality: N/A;  . HERNIA REPAIR Right   . LEFT HEART CATH AND CORONARY ANGIOGRAPHY N/A 12/12/2019   Procedure: LEFT HEART CATH AND CORONARY ANGIOGRAPHY;  Surgeon: Jettie Booze, MD;  Location: [place] CV [facility];  Service: Cardiovascular;  Laterality: N/A;  . LIVER BIOPSY    . LIVER SURGERY    . MAZE N/A 12/19/2019   Procedure: MAZE;  Surgeon: Wonda Olds,  MD;  Location: [place];  Service: Open Heart Surgery;  Laterality: N/A;  . POLYPECTOMY  06/01/2017   Procedure: POLYPECTOMY;  Surgeon: Rogene Houston, MD;  [facility];  Service: Endoscopy;;  polyp at sigmoid colon x2, ascending colon polyp, hepatic flexure polyp  . RIGHT HEART CATH N/A 12/17/2019   Procedure: RIGHT HEART CATH;  Surgeon: Larey Dresser, MD;  Location: [place] [facility];  Service: Cardiovascular;  Laterality: N/A;  . TEE WITHOUT CARDIOVERSION N/A 12/19/2019   Procedure: TRANSESOPHAGEAL ECHOCARDIOGRAM (TEE);  Surgeon: Wonda Olds, MD;  Location: [place];  Service: Open Heart Surgery;  Laterality: N/A;  . UPPER GASTROINTESTINAL ENDOSCOPY        Interim history/subjective:  Off all vasoactive infusions.  Able to walk yesterday.  Transitioning to IHD.  Objective   Blood pressure (!) 115/64, pulse 81, temperature 98.2 F (36.8 C), temperature source Oral, resp. rate 22, height 5\' 10"  (1.778 m), weight 86.1 kg, SpO2 93 %. CVP:  [13 mmHg-17 mmHg] 15 mmHg      Intake/Output Summary (Last 24 hours) at 12/28/2019 1508 Last data filed at 12/28/2019 0800 Gross per 24 hour  Intake 300 ml  Output --  Net 300 ml   Filed Weights   12/26/19 0500 12/27/19 0459 12/28/19 0500  Weight: 84.4 kg 85.9 kg 86.1 kg   Examinations:  General: NAD, resting  in bed HE: Normocephalic, atraumatic , EOMI, Conjunctivae normal ENT: No congestion, no rhinorrhea, no exudate or erythema  Cardiovascular: Normal rate, regular rhythm.  No murmurs, rubs, or gallops Pulmonary : Effort normal, breath sounds normal. No wheezes, rales, or rhonchi, currently on room air. Abdominal: soft, nontender,  bowel sounds present Musculoskeletal: no swelling , deformity, injury ,or tenderness in extremities Skin: Warm, dry , no bruising, erythema, or rash Psychiatric/Behavioral:  anxious mood, anxious behavior  Neuro: Alert and oriented x 4  Ancillary tests (personally reviewed)  CBC: Recent Labs   Lab 12/24/19 0433 12/25/19 0429 12/26/19 0347 12/27/19 0448 12/28/19 0226  WBC 13.9* 11.3* 9.6 13.1* 16.5*  HGB 8.6* 9.1* 8.6* 8.8* 9.0*  HCT 27.4* 28.9* 27.7* 28.7* 29.0*  MCV 83.8 84.0 84.2 84.7 83.6  PLT 156 168 137* 158 829    Basic Metabolic Panel: Recent Labs  Lab 12/24/19 0433 12/24/19 1627 12/25/19 0429 12/25/19 1604 12/26/19 0340 12/26/19 0340 12/26/19 1606 12/26/19 1606 12/26/19 2044 12/26/19 2321 12/27/19 0448 12/27/19 1600 12/27/19 2000 12/28/19 0226  NA 135   < > 133*   < > 135  --  132*  --   --   --  133* 134*  --  133*  K 4.8   < > 4.4   < > 4.3  --  4.3  --   --   --  4.7 4.7  --  4.7  CL 101   < > 99   < > 101  --  98  --   --   --  99 97*  --  98  CO2 24   < > 24   < > 27  --  24  --   --   --  26 24  --  23  GLUCOSE 235*   < > 223*   < > 206*  --  274*  --   --   --  225* 173*  --  109*  BUN 30*   < > 27*   < > 23  --  22  --   --   --  33* 46*  --  52*  CREATININE 3.01*   < > 2.54*   < > 2.16*  --  1.99*  --   --   --  2.99* 3.70*  --  4.37*  CALCIUM 8.0*   < > 7.9*   < > 7.5*  --  7.5*  --   --   --  7.5* 7.2*  --  7.3*  MG 2.6*  --  2.6*  --  2.6*  --   --   --   --   --  2.4  --   --  2.5*  PHOS 2.9   < > 1.5*   < > <1.0*   < > 4.0   < > 1.1* <1.0* 1.1* 7.5* 6.0*  --    < > = values in this interval not displayed.   GFR: Estimated Creatinine Clearance: 18.1 mL/min (A) (by C-G formula based on SCr of 4.37 mg/dL (H)). Recent Labs  Lab 12/22/19 1548 12/22/19 1845 12/23/19 0436 12/25/19 0429 12/26/19 0347 12/27/19 0448 12/28/19 0226  WBC  --   --    < > 11.3* 9.6 13.1* 16.5*  LATICACIDVEN 4.9* 2.8*  --   --   --   --   --    < > = values in this interval not displayed.    Liver Function Tests:  Recent Labs  Lab 12/22/19 1530 12/23/19 0436 12/26/19 0340 12/26/19 1606 12/27/19 0448 12/27/19 1600 12/28/19 0226  AST 31  --   --   --  169*  --  71*  ALT 20  --   --   --  130*  --  100*  ALKPHOS 66  --   --   --  307*  --  278*   BILITOT 0.9  --   --   --  0.5  --  0.7  PROT 5.4*  --   --   --  5.6*  --  5.9*  ALBUMIN 2.3*   < > 2.3* 2.4* 2.3*  2.3* 2.4* 2.4*   < > = values in this interval not displayed.   No results for input(s): LIPASE, AMYLASE in the last 168 hours. No results for input(s): AMMONIA in the last 168 hours.  ABG    Component Value Date/Time   PHART 7.241 (L) 12/22/2019 1411   PCO2ART 42.6 12/22/2019 1411   PO2ART 42 (L) 12/22/2019 1411   HCO3 18.3 (L) 12/22/2019 1411   TCO2 20 (L) 12/22/2019 1411   ACIDBASEDEF 8.0 (H) 12/22/2019 1411   O2SAT 59.5 12/28/2019 0216     Coagulation Profile: No results for input(s): INR, PROTIME in the last 168 hours.  Cardiac Enzymes: No results for input(s): CKTOTAL, CKMB, CKMBINDEX, TROPONINI in the last 168 hours.  HbA1C: Hgb A1c MFr Bld  Date/Time Value Ref Range Status  12/11/2019 09:40 PM 5.9 (H) 4.8 - 5.6 % Final    Comment:    (NOTE) Pre diabetes:          5.7%-6.4%  Diabetes:              >6.4%  Glycemic control for   <7.0% adults with diabetes   03/26/2013 10:30 PM 5.8 (H) <5.7 % Final    Comment:    (NOTE)                                                                       According to the ADA Clinical Practice Recommendations for 2011, when HbA1c is used as a screening test:  >=6.5%   Diagnostic of Diabetes Mellitus           (if abnormal result is confirmed) 5.7-6.4%   Increased risk of developing Diabetes Mellitus References:Diagnosis and Classification of Diabetes Mellitus,Diabetes FBPP,9432,76(DYJWL 1):S62-S69 and Standards of Medical Care in         Diabetes - 2011,Diabetes Care,2011,34 (Suppl 1):S11-S61.    CBG: Recent Labs  Lab 12/27/19 2319 12/28/19 0318 12/28/19 0637 12/28/19 0744 12/28/19 1134  GLUCAP 134* 100* 109* 156* 135*    Assessment & Plan:   CAD with ischemic cardiomyopathy S/P CABGx5.  -Currently stable off vasoactive infusions. -Observe response to IHD prior to initiating heart failure  therapy.  Atrial Fibrillation s/p MAZE Sinus rhythm today. On IV Amio and in NSR.  - Continue PO amio 200 daily - Continue Eliquis   AKI requiring CRRT CKD stage III  CRRT at 50 ml/hr, remains anuric  - Transition to intermittent HD per nephrology.   Status post liver transplant - Tacrolimus levels are not back.Restarted on half home dose. HF Pharmacy coordinating with transplant center.  Plan: - Tacro 1mg  BID - Follow up Tacro level from 7/21 - Follow up recommendations on Tacro dosing   Steroid induced hyperglycemia Now off steroids . Blood glucose improving. Will need to stop tube feeds at some point to encourage PO intake. Will start with giving regular diet today with fluid restrictions ( will not eat current diet) and tube feeds at night.  Plan: - No Basal regimen at this time - 3 units with tube feeds. - SSI for bolus - monitor CBG  Hospital Delirium  Anxiety/depression Patient lost his wife a year and half ago. Reports anxiety and depression at baseline now. His current health condition has worsened his anxiety. His hallucination went away with Precedex used for sleep overnight. He reports he didn't sleep well, but  RN observed his sleep was better. Will continue Precedex at night and his home ativan during the day. Also Effexor.  Plan: Effexor daily PRN Ativan BID Precedex qhs for sleep  PCCM will sign off today please reconsult if further questions arise.  Daily Goals Checklist  Pain/Anxiety/Delirium protocol (if indicated): oral meds DVT prophylaxis: apixiban Nutritional status and feeding goals: pull NGT tomorrow if intake keeps up.  Fluid status goals: transition to iHD Urinary catheter: no catheter Central lines: right introducer, left HD cath Glucose control: euglycemic Mobility/therapy needs: OOB to chair Daily labs: RFP, MG Code Status: full Family Communication: per cardiac surgery Disposition: ICU  Kipp Brood, MD [facility] ICU Physician [place]  Pager: [phone] Mobile: [phone] After hours: [phone].  12/28/2019, 3:08 PM

## 2019-12-28 NOTE — Consult Note (Signed)
Chief Complaint: Patient was seen in consultation today for tunneled HD catheter placement.  Referring Physician(s): [name]  Supervising Physician: Arne Cleveland  Patient Status: [facility] - In-pt  History of Present Illness: Joseph Greer is a 64 y.o. male with a past medical history significant for anxiety, depression, hepatitis C with history of liver transplant currently on tacrolimus, DM, HTN and CKD who was transferred to [facility] from [facility] on 12/11/19 due to complaints of exertional dyspnea with elevated troponin and new onset a.fib with RVR. He underwent left heart cath on 7/9 which showed 3 vessel CAD and echo also showed EF of 20%. He underwent biatrial maze procedure with radiofrequency ablation/cryo ablation, closure of patent foramen ovale and CABG x 5 on 7/16. He experienced a significant increase in his creatinine 48 hours post procedure and nephrology was consulted - he was started on CRRT which was stopped yesterday. He remains anuric with uremic symptoms and persistently elevated creatinine so decision has been made to begin hemodialysis - IR has been asked to place a tunneled HD catheter to facilitate this.  Joseph Greer seen in his room watching TV and eating lunch, he reports appetite is better today and no current n/v but did have some previously. He gets very short of breath when he lays flat. He is aware of the need for hemodialysis and is agreeable to this as well as tunneled HD catheter placement.  Past Medical History:  Diagnosis Date  . Anxiety   . CKD (chronic kidney disease), stage III   . Depression   . Diabetes mellitus type 2, noninsulin dependent ([place])   . Hepatitis C   . Hypertension   . Psoriasis   . Status post liver transplant [facility])     Past Surgical History:  Procedure Laterality Date  . CLIPPING OF ATRIAL APPENDAGE N/A 12/19/2019   Procedure: CLIPPING OF ATRIAL APPENDAGE USING ATRICURE CLIP SIZE 50MM;  Surgeon: Wonda Olds,  MD;  Location: [place];  Service: Open Heart Surgery;  Laterality: N/A;  . COLONOSCOPY    . COLONOSCOPY WITH PROPOFOL N/A 06/01/2017   Procedure: COLONOSCOPY WITH PROPOFOL;  Surgeon: Rogene Houston, MD;  [facility];  Service: Endoscopy;  Laterality: N/A;  7:30  . CORONARY ARTERY BYPASS GRAFT N/A 12/19/2019   Procedure: CORONARY ARTERY BYPASS GRAFTING (CABG) TIMES  X 5,   USING BILATERAL MAMMARY ARTERY AND RIGHT LEG GREATER SAPHENOUS VEIN HARVESTED ENDOSCOPICALLY;  Surgeon: Wonda Olds, MD;  Location: [place];  Service: Open Heart Surgery;  Laterality: N/A;  . HERNIA REPAIR Right   . LEFT HEART CATH AND CORONARY ANGIOGRAPHY N/A 12/12/2019   Procedure: LEFT HEART CATH AND CORONARY ANGIOGRAPHY;  Surgeon: Jettie Booze, MD;  Location: [place] CV LAB;  Service: Cardiovascular;  Laterality: N/A;  . LIVER BIOPSY    . LIVER SURGERY    . MAZE N/A 12/19/2019   Procedure: MAZE;  Surgeon: Wonda Olds, MD;  Location: [place];  Service: Open Heart Surgery;  Laterality: N/A;  . POLYPECTOMY  06/01/2017   Procedure: POLYPECTOMY;  Surgeon: Rogene Houston, MD;  [facility];  Service: Endoscopy;;  polyp at sigmoid colon x2, ascending colon polyp, hepatic flexure polyp  . RIGHT HEART CATH N/A 12/17/2019   Procedure: RIGHT HEART CATH;  Surgeon: Larey Dresser, MD;  Location: [place] CV LAB;  Service: Cardiovascular;  Laterality: N/A;  . TEE WITHOUT CARDIOVERSION N/A 12/19/2019   Procedure: TRANSESOPHAGEAL ECHOCARDIOGRAM (TEE);  Surgeon: Wonda Olds, MD;  Location: [place];  Service: Open Heart Surgery;  Laterality: N/A;  . UPPER GASTROINTESTINAL ENDOSCOPY      Allergies: Patient has no known allergies.  Medications: Prior to Admission medications   Medication Sig Start Date End Date Taking? Authorizing Provider  amLODipine (NORVASC) 5 MG tablet Take 5 mg by mouth daily. 10/28/19  Yes [provider]  Apremilast (OTEZLA) 30 MG TABS Take 1 tablet by  mouth 2 (two) times daily. 06/16/19  Yes [provider]  LORazepam (ATIVAN) 1 MG tablet Take 1 mg by mouth 2 (two) times daily as needed (panic attacks).  11/28/19  Yes [provider]  nitroGLYCERIN (NITROSTAT) 0.4 MG SL tablet Place 1 tablet (0.4 mg total) under the tongue every 5 (five) minutes x 3 doses as needed for chest pain. 03/27/13  Yes Rai, Ripudeep K, MD  tacrolimus (PROGRAF) 1 MG capsule Take 2 mg by mouth 2 (two) times daily.    Yes [provider]  venlafaxine XR (EFFEXOR-XR) 75 MG 24 hr capsule Take 75 mg by mouth daily.   Yes [provider]  lisinopril (PRINIVIL,ZESTRIL) 20 MG tablet Take 20 mg by mouth daily. Patient not taking: Reported on 12/11/2019    [provider]  mycophenolate (CELLCEPT) 250 MG capsule Take 250 mg by mouth 2 (two) times daily.  Patient not taking: Reported on 12/11/2019    [provider]  triamcinolone ointment (KENALOG) 0.1 % Apply 1 application topically 2 (two) times daily. Patient not taking: Reported on 12/11/2019    [provider]     Family History  Problem Relation Age of Onset  . Diabetes Mother   . Heart disease Father   . Heart disease Son     Social History   Socioeconomic History  . Marital status: Single    Spouse name: Not on file  . Number of children: Not on file  . Years of education: Not on file  . Highest education level: Not on file  Occupational History  . Not on file  Tobacco Use  . Smoking status: Former Smoker    Packs/day: 1.00    Types: Cigarettes    Quit date: 09/04/2007    Years since quitting: 12.3  . Smokeless tobacco: Never Used  Vaping Use  . Vaping Use: Never used  Substance and Sexual Activity  . Alcohol use: No  . Drug use: No  . Sexual activity: Yes    Birth control/protection: None  Other Topics Concern  . Not on file  Social History Narrative  . Not on file   Social Determinants of Health   Financial Resource Strain:   .  Difficulty of Paying Living Expenses:   Food Insecurity:   . Worried About Charity fundraiser in the Last Year:   . Arboriculturist in the Last Year:   Transportation Needs:   . Film/video editor (Medical):   Marland Kitchen Lack of Transportation (Non-Medical):   Physical Activity:   . Days of Exercise per Week:   . Minutes of Exercise per Session:   Stress:   . Feeling of Stress :   Social Connections:   . Frequency of Communication with Friends and Family:   . Frequency of Social Gatherings with Friends and Family:   . Attends Religious Services:   . Active Member of Clubs or Organizations:   . Attends Archivist Meetings:   Marland Kitchen Marital Status:  Review of Systems: A 12 point ROS discussed and pertinent positives are indicated in the HPI above.  All other systems are negative.  Review of Systems  Constitutional: Positive for appetite change and fatigue. Negative for chills and fever.  Respiratory: Positive for shortness of breath. Negative for cough.   Cardiovascular: Negative for chest pain.  Gastrointestinal: Negative for abdominal pain, nausea and vomiting.  Musculoskeletal: Negative for back pain.  Neurological: Negative for headaches.    Vital Signs: BP (!) 104/59   Pulse 80   Temp 98.2 F (36.8 C) (Oral)   Resp 20   Ht 5\' 10"  (1.778 m)   Wt 189 lb 13.1 oz (86.1 kg)   SpO2 96%   BMI 27.24 kg/m   Physical Exam Vitals and nursing note reviewed.  Constitutional:      General: He is not in acute distress.    Appearance: He is ill-appearing.  HENT:     Head: Normocephalic.     Mouth/Throat:     Mouth: Mucous membranes are moist.     Pharynx: Oropharynx is clear. No oropharyngeal exudate or posterior oropharyngeal erythema.  Cardiovascular:     Rate and Rhythm: Regular rhythm. Tachycardia present.     Comments: (+) left IJ central line present Pulmonary:     Effort: Pulmonary effort is normal.     Breath sounds: Normal breath sounds.  Abdominal:      General: There is no distension.     Palpations: Abdomen is soft.     Tenderness: There is no abdominal tenderness.  Skin:    General: Skin is warm and dry.  Neurological:     Mental Status: He is alert and oriented to person, place, and time.  Psychiatric:        Mood and Affect: Mood normal.        Behavior: Behavior normal.        Thought Content: Thought content normal.        Judgment: Judgment normal.      MD Evaluation Airway: WNL Heart: WNL Abdomen: WNL Chest/ Lungs: WNL ASA  Classification: 3 Mallampati/Airway Score: Two   Imaging: DG Chest 1 View  Result Date: 12/27/2019 CLINICAL DATA:  Status post open heart surgery. Left jugular central line and right-sided PICC line placement. EXAM: CHEST  1 VIEW COMPARISON:  12/26/2019 FINDINGS: Left IJ catheter tip projects over the SVC. There is a right-sided PICC line with tip at the cavoatrial junction. Status post median sternotomy and CABG procedure. Stable cardiac enlargement. Small pleural effusions. No interstitial edema. Atelectasis within the left midlung and left base. IMPRESSION: Persistent small pleural effusions with left midlung and left base atelectasis. Electronically Signed   By: Kerby Moors M.D.   On: 12/27/2019 10:42   DG Chest 1 View  Result Date: 12/26/2019 CLINICAL DATA:  Chest pain following cardiac surgery EXAM: CHEST  1 VIEW COMPARISON:  12/24/2019 FINDINGS: Cardiac shadow is enlarged. Postsurgical changes are again seen. Feeding catheter is noted coiled within the stomach. Left jugular central line and right-sided PICC line are noted and stable. Right jugular sheath appears to have been removed. The lungs are well aerated bilaterally. Minimal left basilar atelectasis is again noted. IMPRESSION: Minimal left basilar atelectasis. Tubes and lines as described. Electronically Signed   By: Inez Catalina M.D.   On: 12/26/2019 08:19   DG Chest 1 View  Result Date: 12/24/2019 CLINICAL DATA:  Status post cardiac  surgery. EXAM: CHEST  1 VIEW COMPARISON:  December 23, 2019.  FINDINGS: Feeding tube is unchanged in position. Bilateral internal jugular catheters are unchanged. Right-sided PICC line is unchanged. Bilateral chest tubes are noted without pneumothorax. Right lung is clear. Stable elevated left hemidiaphragm is noted with mild left basilar atelectasis. Bony thorax is unremarkable. IMPRESSION: Stable support apparatus. No pneumothorax is noted. Stable elevated left hemidiaphragm with mild left basilar atelectasis. Electronically Signed   By: Marijo Conception M.D.   On: 12/24/2019 08:24   CT chest without contrast  Result Date: 12/17/2019 CLINICAL DATA:  Coronary artery disease.  Preoperative examination. EXAM: CT CHEST WITHOUT CONTRAST TECHNIQUE: Multidetector CT imaging of the chest was performed following the standard protocol without IV contrast. COMPARISON:  None. FINDINGS: Cardiovascular: There is extensive coronary artery calcification identified. Left ventricular and left atrial dilation with mild global resultant cardiomegaly. Enlargement of the central pulmonary arteries is in keeping with pulmonary arterial hypertension, possibly the result of left heart failure. No pericardial effusion. The thoracic aorta is normal in course and caliber. There is no significant mural calcification in the ascending segment. Normal configuration of the arch vasculature. Right upper extremity PICC line tip noted within the superior right atrium. Mediastinum/Nodes: No pathologic thoracic adenopathy. Thyroid unremarkable. Esophagus unremarkable. Lungs/Pleura: Small bilateral pleural effusions are present with bibasilar compressive atelectasis. There is mild smooth interlobular septal thickening noted at the lung bases in keeping with trace pulmonary edema, likely cardiogenic in nature. No confluent pulmonary infiltrates. No focal pulmonary nodules. No pneumothorax. Central airways are widely patent. Upper Abdomen: Absence of the  left hepatic lobe suggesting prior surgical resection or atrophy Musculoskeletal: No acute bone abnormality. IMPRESSION: Mild cardiogenic failure. Extensive coronary artery calcification. Dilation of the left ventricle and left atrium. Enlarged central pulmonary arteries in keeping with pulmonary arterial hypertension, likely the result of left heart failure. No significant calcification within the ascending aorta. Electronically Signed   By: Fidela Salisbury MD   On: 12/17/2019 21:08   CARDIAC CATHETERIZATION  Result Date: 12/17/2019 1. Elevated PCWP >> RA pressure. 2. Pulmonary venous hypertension. 3. Preserved cardiac output.   CARDIAC CATHETERIZATION  Addendum Date: 12/12/2019    Dist RCA lesion is 100% stenosed. Faint left to right and right to right collaterals.  Ramus lesion is 95% stenosed.  1st Diag lesion is 75% stenosed.  Prox Cx lesion is 99% stenosed. THis is the culprit lesion. TIMI 2 flow.  Ost LAD to Prox LAD lesion is 60% stenosed. Complex lesion with shelf of plaque.  Mid LAD lesion is 80% stenosed.  LV end diastolic pressure is moderately elevated.  There is no aortic valve stenosis.  Plan for cardiac surgery consult.   Result Date: 12/12/2019  Dist RCA lesion is 100% stenosed. Faint left to right and right to right collaterals.  Ramus lesion is 95% stenosed.  1st Diag lesion is 75% stenosed.  Prox Cx lesion is 99% stenosed. THis is the culprit lesion. TIMI 2 flow.  Ost LAD to Prox LAD lesion is 60% stenosed. Complex lesion with shelf of plaque.  Mid LAD lesion is 80% stenosed.  LV end diastolic pressure is moderately elevated.  There is no aortic valve stenosis.  Plan for cardiac surgery consult.   US RENAL  Result Date: 12/21/2019 CLINICAL DATA:  Acute kidney injury, CKD stage 3 EXAM: RENAL / URINARY TRACT ULTRASOUND COMPLETE COMPARISON:  None. FINDINGS: Right Kidney: Renal measurements: 9.6 x 5.2 x 5.2 cm = volume: 137.6 mL. Mild cortical thinning and diffusely  increased renal cortical echogenicity. No mass, shadowing calculus or  hydronephrosis. Left Kidney: Renal measurements: 9.7 x 6.2 x 6.3 cm = volume: 198.4 mL. Mild cortical thinning and diffusely increased renal cortical echogenicity. No mass, shadowing calculus or hydronephrosis. Bladder: Bladder decompressed around an inflated Foley catheter visualized within the lumen. Other: Mild splenic heterogeneity, a nonspecific finding. IMPRESSION: Increased renal cortical echogenicity and cortical thinning bilaterally. Can be seen in the setting of medical renal disease/chronic kidney disease. Bladder decompressed by Foley catheter. Mild splenic heterogeneity is a nonspecific finding. Few echogenic foci could reflect sequela of prior granulomatous disease. Electronically Signed   By: Lovena Le M.D.   On: 12/21/2019 15:25   DG CHEST PORT 1 VIEW  Result Date: 12/23/2019 CLINICAL DATA:  Chest tube EXAM: PORTABLE CHEST 1 VIEW COMPARISON:  Portable exam 0803 hours compared to 12/22/2019 FINDINGS: Feeding tube coiled in stomach. External pacing leads project over chest. LEFT jugular line tip projecting over SVC. RIGHT jugular line tip projecting over brachiocephalic vein at confluence of SVC. RIGHT arm PICC line tip projects over SVC. BILATERAL thoracostomy tubes present. Enlargement of cardiac silhouette with prominent mediastinum unchanged. Slight rotation to the RIGHT. Elevation of LEFT diaphragm with persistent LEFT basilar atelectasis. No definite acute infiltrate, pleural effusion, or pneumothorax. IMPRESSION: Persistent LEFT basilar atelectasis. Line and tube positions as above. Electronically Signed   By: Lavonia Dana M.D.   On: 12/23/2019 09:51   DG Chest Port 1 View  Result Date: 12/22/2019 CLINICAL DATA:  Central line placement. EXAM: PORTABLE CHEST 1 VIEW COMPARISON:  December 21, 2019. FINDINGS: Stable cardiomegaly. Right internal jugular venous sheath is unchanged. Right-sided PICC line is unchanged. Interval  placement of left internal jugular catheter with distal tip in expected position of the SVC. No pneumothorax or pleural effusion is noted. Bilateral chest tubes are noted in unchanged. Stable left basilar atelectasis or infiltrate is noted. Minimal right basilar subsegmental atelectasis is noted. Bony thorax is unremarkable. IMPRESSION: Interval placement of left internal jugular catheter with distal tip in expected position of the SVC. No pneumothorax is noted. Otherwise stable support apparatus. Electronically Signed   By: Marijo Conception M.D.   On: 12/22/2019 14:53   DG Chest Port 1 View  Result Date: 12/21/2019 CLINICAL DATA:  Atelectasis EXAM: PORTABLE CHEST 1 VIEW COMPARISON:  Radiograph 12/20/2019 FINDINGS: A right IJ catheter sheath terminates in the upper SVC. Right upper extremity PICC tip terminates at the superior cavoatrial junction. Right apically directed chest tube in place. Mediastinal drain in place. Epicardial pacer wires are present overlying the upper abdomen and mediastinum. Telemetry leads overlie the chest. Low lung volumes with atelectatic change as well as some mild hazy basilar opacities, left greater than right, with a small left pleural effusion which are similar to comparison exam. No new consolidative opacity or pneumothorax. No visible right effusion though not completely excluded on this non upright film. No acute osseous or soft tissue abnormality. Degenerative changes are present in the imaged spine and shoulders. IMPRESSION: Low volumes and atelectatic changes with additional hazy basilar opacities, left greater than right and small left effusion. Could reflect some residual edema and volume loss. Lines and tubes as above. Electronically Signed   By: Lovena Le M.D.   On: 12/21/2019 15:23   DG Chest Port 1 View  Result Date: 12/20/2019 CLINICAL DATA:  Post CABG. EXAM: PORTABLE CHEST 1 VIEW COMPARISON:  12/19/2019 FINDINGS: Endotracheal tube has been removed. Pulmonary  artery catheter is in a stable position overlying the thoracic spine. Again noted are chest drains. Negative  for pneumothorax. Right arm PICC line tip is probably near the SVC and right atrium junction. Heart size remains enlarged. Slightly increased densities in the mid and lower left chest that could represent atelectasis and/or mild edema. IMPRESSION: 1. Slightly increased densities in the mid and lower left chest. Findings could be related to atelectasis versus mild edema. 2. Endotracheal tube has been removed. The other support apparatuses appear to be stable. Negative for pneumothorax. Electronically Signed   By: Markus Daft M.D.   On: 12/20/2019 11:03   DG Chest Port 1 View  Result Date: 12/19/2019 CLINICAL DATA:  Status post thoracic surgery. EXAM: PORTABLE CHEST 1 VIEW COMPARISON:  December 09, 2019. FINDINGS: Endotracheal and nasogastric tubes are in good position. Right internal jugular catheter is noted with distal tip in expected position of main pulmonary artery. Bilateral chest tubes are noted without pneumothorax. Right-sided PICC line is unchanged in position. No pneumothorax or pleural effusion is noted. Lungs are clear. Bony thorax is unremarkable. IMPRESSION: Stable support apparatus. Bilateral chest tubes are noted without pneumothorax. No acute cardiopulmonary abnormality seen. Electronically Signed   By: Marijo Conception M.D.   On: 12/19/2019 15:49   DG Abd Portable 1V  Result Date: 12/22/2019 CLINICAL DATA:  Feeding tube placement EXAM: PORTABLE ABDOMEN - 1 VIEW COMPARISON:  None. FINDINGS: Nasoenteric feeding tube is seen looped within the gastric fundus. There is mild gaseous distension of the stomach. Multiple gas-filled loops of small and large bowel are seen within the visualized abdomen suggesting an underlying ileus. Right flank and pelvis is excluded from view. Visualized lung bases are clear. Mild cardiomegaly is noted. IMPRESSION: Nasoenteric feeding tube looped within the gastric  fundus. Mild ileus suspected. Electronically Signed   By: Fidela Salisbury MD   On: 12/22/2019 16:42   MR CARDIAC MORPHOLOGY W WO CONTRAST  Result Date: 12/15/2019 CLINICAL DATA:  Ischemic Cardiomyopathy Viability EXAM: CARDIAC MRI TECHNIQUE: The patient was scanned on a 1.5 Tesla GE magnet. A dedicated cardiac coil was used. Functional imaging was done using Fiesta sequences. 2,3, and 4 chamber views were done to assess for RWMA's. Modified Simpson's rule using a short axis stack was used to calculate an ejection fraction on a dedicated work Conservation officer, nature. The patient received 8 cc of Gadavist . After 10 minutes inversion recovery sequences were used to assess for infiltration and scar tissue. CONTRAST:  Gadavist FINDINGS: Moderate LAE Normal RA. Mild RVE with mildly decreased function. Possible PFO. Trivial pericardial effusion Normal AV. Trivial MR. Normal TV with mild TR Severe LVE with diffuse hypokinesis. The posterior lateral wall is thinned and severely hypokinetic as is the inferior apex. The circumflex distribution posterior wall Appears thinned and less viable with 2/3 rd thickness gadolinium uptake There is subendocardial uptake in the inferior wall but the inferior apex is thinned and severely hypokinetic Quantitative LVEF 26% (EDV 279, ESV 205 SV 74 cc) Quantitative RVEF 38% (EDV 113 cc ESV 98 cc SV 61 cc) IMPRESSION: 1. Severe LVE with EF 26%. The circumflex distribution appears less viable with near full thickness scar and thinning of the posterior lateral wall. The Inferior apex in the PLB/PDA territory is also thinned and severely hypokinetic with 1/3 thickness gadolinium uptake 2.  Mild RVE with mild hypokinesis RVEF 38% 3.  Moderate LAE 4.  Trivial pericardial effusion 5.  Possible PFO [place] Electronically Signed   By: [place] M.D.   On: 12/15/2019 14:07   ECHOCARDIOGRAM COMPLETE  Result Date: 12/12/2019  ECHOCARDIOGRAM REPORT   Patient Name:   Joseph Greer  [facility] Date of Exam: 12/12/2019 Medical Rec #:  [id number]            Height:       70.0 in Accession #:    [id number]           Weight:       186.2 lb Date of Birth:  Sep 07, 1955            BSA:          2.025 m Patient Age:    34 years             BP:           114/80 mmHg Patient Gender: M                    HR:           83 bpm. Exam Location:  Inpatient Procedure: 2D Echo and Intracardiac Opacification Agent Indications:    NSTEMI I21.4  History:        Patient has prior history of Echocardiogram examinations, most                 recent 03/27/2013. Risk Factors:Hypertension and Diabetes.  Sonographer:    Mikki Santee RDCS (AE) Referring Phys: [place]  1. LVEF is depressed with severe hypokinesis in all segments except septum. . Left ventricular ejection fraction, by estimation, is 20 to 25%. The left ventricle has severely decreased function. The left ventricular internal cavity size was severely dilated. Left ventricular diastolic parameters are indeterminate.  2. Right ventricular systolic function is moderately reduced. The right ventricular size is normal.  3. Left atrial size was mild to moderately dilated.  4. Turbulent flow along intraatrial septum suspicious for PFO . Evidence of atrial level shunting detected by color flow Doppler.  5. The mitral valve is abnormal. Mild to moderate mitral valve regurgitation.  6. The aortic valve is normal in structure. Aortic valve regurgitation is trivial. FINDINGS  Left Ventricle: LVEF is depressed with severe hypokinesis in all segments except septum. Left ventricular ejection fraction, by estimation, is 20 to 25%. The left ventricle has severely decreased function. The left ventricle demonstrates regional wall motion abnormalities. Definity contrast agent was given IV to delineate the left ventricular endocardial borders. The left ventricular internal cavity size was severely dilated. There is no left ventricular hypertrophy. Left  ventricular diastolic parameters are indeterminate. Right Ventricle: The right ventricular size is normal. No increase in right ventricular wall thickness. Right ventricular systolic function is moderately reduced. Left Atrium: Left atrial size was mild to moderately dilated. Right Atrium: Right atrial size was normal in size. Pericardium: There is no evidence of pericardial effusion. Mitral Valve: The mitral valve is abnormal. There is mild thickening of the mitral valve leaflet(s). Mild to moderate mitral annular calcification. Mild to moderate mitral valve regurgitation. Tricuspid Valve: The tricuspid valve is normal in structure. Tricuspid valve regurgitation is trivial. Aortic Valve: The aortic valve is normal in structure. Aortic valve regurgitation is trivial. Pulmonic Valve: The pulmonic valve was normal in structure. Pulmonic valve regurgitation is not visualized. Aorta: The aortic root was not well visualized. IAS/Shunts: Evidence of atrial level shunting detected by color flow Doppler.  LEFT VENTRICLE PLAX 2D LVIDd:         6.30 cm LVIDs:         5.70 cm LV PW:  1.00 cm LV IVS:        1.00 cm LVOT diam:     2.50 cm LV SV:         62 LV SV Index:   31 LVOT Area:     4.91 cm  LV Volumes (MOD) LV vol d, MOD A2C: 131.0 ml LV vol d, MOD A4C: 211.0 ml LV vol s, MOD A2C: 90.3 ml LV vol s, MOD A4C: 162.0 ml LV SV MOD A2C:     40.7 ml LV SV MOD A4C:     211.0 ml LV SV MOD BP:      49.2 ml RIGHT VENTRICLE RV S prime:     9.90 cm/s TAPSE (M-mode): 1.1 cm LEFT ATRIUM              Index       RIGHT ATRIUM           Index LA diam:        5.20 cm  2.57 cm/m  RA Area:     14.60 cm LA Vol (A2C):   110.0 ml 54.32 ml/m RA Volume:   32.50 ml  16.05 ml/m LA Vol (A4C):   83.7 ml  41.34 ml/m LA Biplane Vol: 96.7 ml  47.76 ml/m  AORTIC VALVE LVOT Vmax:   59.60 cm/s LVOT Vmean:  44.400 cm/s LVOT VTI:    0.126 m  AORTA Ao Root diam: 3.60 cm MITRAL VALVE MV Area (PHT): 6.32 cm    SHUNTS MV Decel Time: 120 msec     Systemic VTI:  0.13 m MR Peak grad: 52.4 mmHg    Systemic Diam: 2.50 cm MR Mean grad: 36.0 mmHg MR Vmax:      362.00 cm/s MR Vmean:     287.0 cm/s MV E velocity: 86.80 cm/s Dorris Carnes MD Electronically signed by Dorris Carnes MD Signature Date/Time: 12/12/2019/11:46:49 AM    Final    ECHO INTRAOPERATIVE TEE  Result Date: 12/19/2019  *INTRAOPERATIVE TRANSESOPHAGEAL REPORT *  Patient Name:   Joseph Greer [facility] Date of Exam: 12/19/2019 Medical Rec #:  [id number]            Height:       70.0 in Accession #:    [id number]           Weight:       186.3 lb Date of Birth:  Apr 17, 1956            BSA:          2.03 m Patient Age:    15 years             BP:           103/47 mmHg Patient Gender: M                    HR:           81 bpm. Exam Location:  Inpatient Transesophogeal exam was perform intraoperatively during surgical procedure. Patient was closely monitored under general anesthesia during the entirety of examination. Indications:     coronary artery disease Performing Phys: [address] GOLD Diagnosing Phys: Hoy Morn MD Complications: No known complications during this procedure. POST-OP IMPRESSIONS - Left Ventricle: has severely reduced systolic function, with an ejection fraction of 25%. The wall motion is abnormal with regional variation. Improved anterior wall motion following CPB, but LV still severely reduced function. - Aorta: The aorta appears unchanged from pre-bypass. - Aortic Valve: No evidence of AV  insufficiency following separation from CPB. - Mitral Valve: Mitral valve regurgitation is trivial following separation from CPB. - Tricuspid Valve: The tricuspid valve appears unchanged from pre-bypass. - Interatrial Septum: s/p PFO closure with no evidence of intra-atrial shunting by Color flow doppler. - Pericardium: The pericardium appears unchanged from pre-bypass. PRE-OP FINDINGS  Left Ventricle: The left ventricle has severely reduced systolic function, with an ejection fraction of 20-25%. The  cavity size was severely dilated. There is no increase in left ventricular wall thickness. Left ventricular diffuse hypokinesis. Right Ventricle: The right ventricle has mildly reduced systolic function. The cavity was normal. There is no increase in right ventricular wall thickness. Left Atrium: Left atrial size was dilated. The left atrial appendage is well visualized and there is no evidence of thrombus present. Left atrial appendage velocity is reduced at less than 40 cm/s. Right Atrium: Interatrial Septum: Evidence of atrial level shunting detected by color flow Doppler. Pericardium: There is no evidence of pericardial effusion. Mitral Valve: The mitral valve is normal in structure. Mild thickening of the mitral valve leaflet. Mild calcification of the mitral valve leaflet. Mitral valve regurgitation is mild to moderate by color flow Doppler. The MR jet is centrally-directed. There is No evidence of mitral stenosis. Tricuspid Valve: The tricuspid valve was normal in structure. Tricuspid valve regurgitation is trivial by color flow Doppler. Aortic Valve: The aortic valve is normal in structure. Aortic valve regurgitation is trivial by color flow Doppler. There is no evidence of aortic valve stenosis. Pulmonic Valve: The pulmonic valve was normal in structure. Pulmonic valve regurgitation is not visualized by color flow Doppler. Aorta: The aortic root, ascending aorta and aortic arch are normal in size and structure. +--------------+-------++ LEFT VENTRICLE        +--------------+-------++ PLAX 2D               +--------------+-------++ LVIDd:        7.28 cm +--------------+-------++ LVIDs:        6.71 cm +--------------+-------++ LV SV:        47 ml   +--------------+-------++ LV SV Index:  22.79   +--------------+-------++                       +--------------+-------++ +----------------+-----------++ MR Peak grad:   92.2 mmHg   +----------------+-----------++ MR Mean grad:    58.0 mmHg   +----------------+-----------++ MR Vmax:        480.00 cm/s +----------------+-----------++ MR Vmean:       349.0 cm/s  +----------------+-----------++ MR PISA:        1.01 cm    +----------------+-----------++ MR PISA Eff ROA:8 mm       +----------------+-----------++ MR PISA Radius: 0.40 cm     +----------------+-----------++  Hoy Morn MD Electronically signed by Hoy Morn MD Signature Date/Time: 12/19/2019/4:54:21 PM    Final    VAS US DOPPLER PRE CABG  Result Date: 12/16/2019 PREOPERATIVE VASCULAR EVALUATION  Indications:      Pre-CABG. Risk Factors:     Hypertension, Diabetes. Comparison Study: no prior Performing Technologist: Abram Sander RVS  Examination Guidelines: A complete evaluation includes B-mode imaging, spectral Doppler, color Doppler, and power Doppler as needed of all accessible portions of each vessel. Bilateral testing is considered an integral part of a complete examination. Limited examinations for reoccurring indications may be performed as noted.  Right Carotid Findings: +----------+--------+--------+--------+------------+--------+           PSV cm/sEDV cm/sStenosisDescribe    Comments +----------+--------+--------+--------+------------+--------+ CCA Prox  74      13              heterogenous         +----------+--------+--------+--------+------------+--------+ CCA Distal60      20              heterogenous         +----------+--------+--------+--------+------------+--------+ ICA Prox  59      27      1-39%   heterogenous         +----------+--------+--------+--------+------------+--------+ ICA Distal72      27                                   +----------+--------+--------+--------+------------+--------+ ECA       91      9                                    +----------+--------+--------+--------+------------+--------+ Portions of this table do not appear on this page.  +----------+--------+-------+--------+------------+           PSV cm/sEDV cmsDescribeArm Pressure +----------+--------+-------+--------+------------+ Subclavian114                                 +----------+--------+-------+--------+------------+ +---------+--------+--+--------+--+---------+ VertebralPSV cm/s35EDV cm/s13Antegrade +---------+--------+--+--------+--+---------+ Left Carotid Findings: +----------+--------+--------+--------+------------+--------+           PSV cm/sEDV cm/sStenosisDescribe    Comments +----------+--------+--------+--------+------------+--------+ CCA Prox  104     26              heterogenous         +----------+--------+--------+--------+------------+--------+ CCA Distal87      23              heterogenous         +----------+--------+--------+--------+------------+--------+ ICA Prox  76      27      1-39%   heterogenous         +----------+--------+--------+--------+------------+--------+ ICA Distal77      37                                   +----------+--------+--------+--------+------------+--------+ ECA       95      15                                   +----------+--------+--------+--------+------------+--------+ +----------+--------+--------+--------+------------+ SubclavianPSV cm/sEDV cm/sDescribeArm Pressure +----------+--------+--------+--------+------------+           106                                  +----------+--------+--------+--------+------------+ +---------+--------+--+--------+--+---------+ VertebralPSV cm/s42EDV cm/s13Antegrade +---------+--------+--+--------+--+---------+  ABI Findings: +--------+------------------+-----+---------+--------------------+ Right   Rt Pressure (mmHg)IndexWaveform Comment              +--------+------------------+-----+---------+--------------------+ Brachial                       triphasicrestricted extremity  +--------+------------------+-----+---------+--------------------+ PTA     134               1.13 triphasic                     +--------+------------------+-----+---------+--------------------+  DP      137               1.15 triphasic                     +--------+------------------+-----+---------+--------------------+ +--------+------------------+-----+---------+-------+ Left    Lt Pressure (mmHg)IndexWaveform Comment +--------+------------------+-----+---------+-------+ [id number]                    triphasic        +--------+------------------+-----+---------+-------+ PTA     131               1.10 triphasic        +--------+------------------+-----+---------+-------+ DP      140               1.18 triphasic        +--------+------------------+-----+---------+-------+ +-------+---------------+----------------+ ABI/TBIToday's ABI/TBIPrevious ABI/TBI +-------+---------------+----------------+ Right  1.15                            +-------+---------------+----------------+ Left   1.18                            +-------+---------------+----------------+  Right Doppler Findings: +--------+--------+-----+---------+--------------------+ Site    PressureIndexDoppler  Comments             +--------+--------+-----+---------+--------------------+ Brachial             triphasicrestricted extremity +--------+--------+-----+---------+--------------------+ Radial               triphasic                     +--------+--------+-----+---------+--------------------+ Ulnar                triphasic                     +--------+--------+-----+---------+--------------------+  Left Doppler Findings: +--------+--------+-----+---------+--------+ Site    PressureIndexDoppler  Comments +--------+--------+-----+---------+--------+ [id number]          triphasic         +--------+--------+-----+---------+--------+ Radial               triphasic          +--------+--------+-----+---------+--------+ Ulnar                triphasic         +--------+--------+-----+---------+--------+  Summary: Right Carotid: Velocities in the right ICA are consistent with a 1-39% stenosis. Left Carotid: Velocities in the left ICA are consistent with a 1-39% stenosis. Vertebrals: Bilateral vertebral arteries demonstrate antegrade flow. Right ABI: Resting right ankle-brachial index is within normal range. No evidence of significant right lower extremity arterial disease. Left ABI: Resting left ankle-brachial index is within normal range. No evidence of significant left lower extremity arterial disease. Right Upper Extremity: Doppler waveforms remain within normal limits with right radial compression. Doppler waveforms remain within normal limits with right ulnar compression. Left Upper Extremity: Doppler waveform obliterate with left radial compression. Doppler waveforms remain within normal limits with left ulnar compression.  Electronically signed by Harold Barban MD on 12/16/2019 at 4:20:38 PM.    Final    ECHOCARDIOGRAM LIMITED  Result Date: 12/23/2019    ECHOCARDIOGRAM LIMITED REPORT   Patient Name:   Joseph Greer [facility] Date of Exam: 12/23/2019 Medical Rec #:  [id number]            Height:       70.0 in Accession #:  [id number]           Weight:       206.1 lb Date of Birth:  June 03, 1956            BSA:          2.114 m Patient Age:    80 years             BP:           123/66 mmHg Patient Gender: M                    HR:           86 bpm. Exam Location:  Inpatient Procedure: Limited Echo and Color Doppler Indications:    Tamponade 423.3 / I31.4  History:        Patient has prior history of Echocardiogram examinations, most                 recent 12/21/2019. Previous Myocardial Infarction, Prior CABG;                 Risk Factors:Hypertension and Former Smoker.  Sonographer:    Clayton Lefort RDCS (AE) Referring Phys: [id number] [facility] Z ATKINS  Sonographer Comments: Drainage  bandages in subcostal area. IMPRESSIONS  1. Limited study for tamponade. Very difficult study due to poor windows. No pericardial effusion present. LVEF severely reduced with global hypokinesis. There is severe hypokinesis of the entire lateral and inferior walls. EF reduced to 20-25% compared  with 12/21/2019 which was likely 30-35%.  2. Left ventricular ejection fraction, by estimation, is 20 to 25%. The left ventricle has severely decreased function.  3. The mitral valve is grossly normal. Trivial mitral valve regurgitation. No evidence of mitral stenosis.  4. The aortic valve is grossly normal. FINDINGS  Left Ventricle: Left ventricular ejection fraction, by estimation, is 20 to 25%. The left ventricle has severely decreased function.  LV Wall Scoring: The entire lateral wall and entire inferior wall are hypokinetic. Pericardium: There is no evidence of pericardial effusion. Mitral Valve: The mitral valve is grossly normal. Trivial mitral valve regurgitation. No evidence of mitral valve stenosis. Aortic Valve: The aortic valve is grossly normal. Pulmonic Valve: The pulmonic valve was grossly normal. Pulmonic valve regurgitation is not visualized. Additional Comments: Limited study for tamponade. Very difficult study due to poor windows. No pericardial effusion present. LVEF severely reduced with global hypokinesis. There is severe hypokinesis of the entire lateral and inferior walls. EF reduced to 20-25% compared with 12/21/2019 which was likely 30-35%. Eleonore Chiquito MD Electronically signed by Eleonore Chiquito MD Signature Date/Time: 12/23/2019/10:26:34 AM    Final    ECHOCARDIOGRAM LIMITED  Result Date: 12/21/2019    ECHOCARDIOGRAM LIMITED REPORT   Patient Name:   Joseph Greer [facility] Date of Exam: 12/21/2019 Medical Rec #:  [id number]            Height:       70.0 in Accession #:    [id number]           Weight:       202.6 lb Date of Birth:  Sep 24, 1955            BSA:          2.099 m Patient Age:    23 years              BP:           114/52 mmHg Patient Gender: M  HR:           89 bpm. Exam Location:  Inpatient Procedure: Limited Echo, Cardiac Doppler and Limited Color Doppler                            MODIFIED REPORT: This report was modified by Dorris Carnes MD on 12/21/2019 due to Complete.  Indications:     Pericardial effusion 423.9 / I31.3  History:         Patient has prior history of Echocardiogram examinations, most                  recent 12/19/2019. CHF, CAD and Acute MI, Prior CABG; Risk                  Factors:Hypertension and Diabetes. Liver transplant in 2009,                  chronic kidney disease.  Sonographer:     Darlina Sicilian RDCS Referring Phys:  [place] Diagnosing Phys: Dorris Carnes MD IMPRESSIONS  1. Left ventricular ejection fraction, by estimation, is 35 to 40%. The left ventricle has moderately decreased function. Left ventricular diastolic parameters are consistent with Grade II diastolic dysfunction Elevated left atrial pressure.  2. Right ventricular systolic function is normal. The right ventricular size is normal.  3. The mitral valve is abnormal. Mild mitral valve regurgitation.  4. The aortic valve is abnormal. Aortic valve regurgitation is not visualized. Mild aortic valve sclerosis is present, with no evidence of aortic valve stenosis.  5. The inferior vena cava is dilated in size with >50% respiratory variability, suggesting right atrial pressure of 8 mmHg. FINDINGS  Left Ventricle: LVEF is depressed with lateral akinesis. Compared to previous echo from 12/12/19, LVEF is improved and regional changes in anterior and inferior walls have improved. Left ventricular ejection fraction, by estimation, is 35 to 40%. The left  ventricle has moderately decreased function. The left ventricle demonstrates regional wall motion abnormalities. Elevated left atrial pressure. Right Ventricle: The right ventricular size is normal. No increase in right ventricular wall thickness.  Right ventricular systolic function is normal. Left Atrium: Left atrial size was normal in size. Right Atrium: Right atrial size was normal in size. Pericardium: There is no evidence of pericardial effusion. Mitral Valve: The mitral valve is abnormal. There is mild thickening of the mitral valve leaflet(s). Mild mitral annular calcification. Mild mitral valve regurgitation. Tricuspid Valve: The tricuspid valve is normal in structure. Tricuspid valve regurgitation is trivial. Aortic Valve: The aortic valve is abnormal. Aortic valve regurgitation is not visualized. Mild aortic valve sclerosis is present, with no evidence of aortic valve stenosis. Pulmonic Valve: The pulmonic valve was not well visualized. Pulmonic valve regurgitation is not visualized. Aorta: The aortic root is normal in size and structure. Venous: The inferior vena cava is dilated in size with greater than 50% respiratory variability, suggesting right atrial pressure of 8 mmHg.  LV Volumes (MOD) LV vol d, MOD A2C: 168.0 ml Diastology LV vol d, MOD A4C: 208.0 ml LV e' lateral:   6.57 cm/s LV vol s, MOD A2C: 99.6 ml  LV E/e' lateral: 14.4 LV vol s, MOD A4C: 158.0 ml LV e' medial:    5.11 cm/s LV SV MOD A2C:     68.4 ml  LV E/e' medial:  18.5 LV SV MOD A4C:     208.0 ml LV SV MOD BP:  62.0 ml AORTIC VALVE LVOT Vmax:   83.60 cm/s LVOT Vmean:  55.400 cm/s LVOT VTI:    0.105 m MITRAL VALVE MV Area (PHT): 4.89 cm    SHUNTS MV Decel Time: 155 msec    Systemic VTI: 0.10 m MV E velocity: 94.70 cm/s  Dorris Carnes MD Electronically signed by Dorris Carnes MD Signature Date/Time: 12/21/2019/11:33:42 AM    Final (Updated)    [place] EKG SITE RITE  Result Date: 12/15/2019 If Site Rite image not attached, placement could not be confirmed due to current cardiac rhythm.   Labs:  CBC: Recent Labs    12/25/19 0429 12/26/19 0347 12/27/19 0448 12/28/19 0226  WBC 11.3* 9.6 13.1* 16.5*  HGB 9.1* 8.6* 8.8* 9.0*  HCT 28.9* 27.7* 28.7* 29.0*  PLT 168 137* 158 186     COAGS: Recent Labs    12/12/19 0559 12/18/19 1539 12/19/19 1500  INR 1.0  --  1.4*  APTT  --  73* 38*    BMP: Recent Labs    12/26/19 1606 12/27/19 0448 12/27/19 1600 12/28/19 0226  NA 132* 133* 134* 133*  K 4.3 4.7 4.7 4.7  CL 98 99 97* 98  CO2 24 26 24 23   GLUCOSE 274* 225* 173* 109*  BUN 22 33* 46* 52*  CALCIUM 7.5* 7.5* 7.2* 7.3*  CREATININE 1.99* 2.99* 3.70* 4.37*  GFRNONAA 35* 21* 17* 14*  GFRAA 41* 25* 19* 16*    LIVER FUNCTION TESTS: Recent Labs    12/18/19 0500 12/22/19 1155 12/22/19 1530 12/23/19 0436 12/26/19 1606 12/27/19 0448 12/27/19 1600 12/28/19 0226  BILITOT 0.8  --  0.9  --   --  0.5  --  0.7  AST 26  --  31  --   --  169*  --  71*  ALT 32  --  20  --   --  130*  --  100*  ALKPHOS 90  --  66  --   --  307*  --  278*  PROT 6.5  --  5.4*  --   --  5.6*  --  5.9*  ALBUMIN 3.1*   < > 2.3*   < > 2.4* 2.3*  2.3* 2.4* 2.4*   < > = values in this interval not displayed.    TUMOR MARKERS: No results for input(s): AFPTM, CEA, CA199, CHROMGRNA in the last 8760 hours.  Assessment and Plan:  64 y/o M with recent CABG x 5, maze procedure with ablation and closure of patent foramen ovale found to have significantly worsened kidney function post procedure. He was started on CRRT which has been stopped and plan is to transition to hemodialysis given his continued elevated creatinine and uremic symptoms. IR has been asked to place a tunneled HD catheter for venous access.  Will plan to proceed with placement 7/26 in IR - patient to be NPO at midnight on 7/26, AM labs, ancef 2 g intra procedure (signed & held to be given in IR).   Risks and benefits discussed with the patient including, but not limited to bleeding, infection, vascular injury, pneumothorax which may require chest tube placement, air embolism or even death.  All of the patient's questions were answered, patient is agreeable to proceed.  Consent signed and in IR control room.  Thank  you for this interesting consult.  I greatly enjoyed meeting ADALBERT ALBERTO and look forward to participating in their care.  A copy of this report was sent to the requesting provider on  this date.  Electronically Signed: Joaquim Nam, PA-C 12/28/2019, 12:19 PM   I spent a total of 20 Minutes in face to face in clinical consultation, greater than 50% of which was counseling/coordinating care for tunneled HD catheter placement.

## 2019-12-28 NOTE — Progress Notes (Signed)
Patient ID: Joseph Greer, male   DOB: 12/09/1955, 64 y.o.   MRN: [id number]     Advanced Heart Failure Rounding Note  PCP-Cardiologist: Candee Furbish, MD  AHF:  Dr. [name]   Patient Profile   64 y/o male w/ h/o Hep C s/p liver transplant in 2009 (followed at [facility]), Stage III CKD, HTN and T2DM admitted w/ NSTEMI and Afib w/ RVR, found to have severe 3VD and biventricular heart failure and ? PFO, LVEF 20-25%, RV moderately reduced. S/p CABG + MAZE + LA appendage clipping 7/16.    Subjective:    - 7/16: CABG x 5 with LIMA-LAD, RIMA-ramus, seq SVG-PD/PL, SVG-D; Maze; LA appendage clipping. - 7/18 Limited echo: LVEF 35-40%. RV normal. No pericardial effusion.  - Post operative course c/b AKI and oliguria. CRRT initiated 7/19.  - 7/19 Milrinone discontinued>>changed to dobutamine. Developed hypotension w/ increase in pressor support. NE and VP added. Empiric abx added for ?HCAP.  Became increasingly tachycardic and dobutamine stopped overnight.   Milrinone turned down to 0.125 yesterday. Co-ox 64 -> 60% CVP 14-15  Remains anuric. Of CVVD since Friday night. Cr up to 4.4 BUN 52 K 4.7   Sitting up in chair. Denies SOB, orthopnea or PND.   Objective:   Weight Range: 86.1 kg Body mass index is 27.24 kg/m.   Vital Signs:   Temp:  [97.6 F (36.4 C)-98.4 F (36.9 C)] 97.7 F (36.5 C) (07/25 0746) Pulse Rate:  [78-94] 81 (07/25 0800) Resp:  [12-34] 20 (07/25 0800) BP: (97-120)/(49-71) 103/60 (07/25 0800) SpO2:  [92 %-99 %] 94 % (07/25 0800) Weight:  [86.1 kg] 86.1 kg (07/25 0500) Last BM Date: 12/27/19  Weight change: Filed Weights   12/26/19 0500 12/27/19 0459 12/28/19 0500  Weight: 84.4 kg 85.9 kg 86.1 kg    Intake/Output:   Intake/Output Summary (Last 24 hours) at 12/28/2019 0916 Last data filed at 12/28/2019 0800 Gross per 24 hour  Intake 780.29 ml  Output --  Net 780.29 ml      Physical Exam   CVP 14-15 General: Sitting in chair No resp difficulty HEENT:  normal Neck: supple. JVP to jaw  LIJ trialysis Carotids 2+ bilat; no bruits. No lymphadenopathy or thryomegaly appreciated. Cor: PMI nondisplaced. Regular rate & rhythm. No rubs, gallops or murmurs. Lungs: clear Abdomen: soft, nontender, nondistended. No hepatosplenomegaly. No bruits or masses. Good bowel sounds. Extremities: no cyanosis, clubbing, rash, trace edema Neuro: alert & orientedx3, cranial nerves grossly intact. moves all 4 extremities w/o difficulty. Affect pleasant   Telemetry   NSR 80s, No VT Personally reviewed   Labs    CBC Recent Labs    12/27/19 0448 12/28/19 0226  WBC 13.1* 16.5*  HGB 8.8* 9.0*  HCT 28.7* 29.0*  MCV 84.7 83.6  PLT 158 086   Basic Metabolic Panel Recent Labs    12/27/19 0448 12/27/19 0448 12/27/19 1600 12/27/19 2000 12/28/19 0226  NA 133*   < > 134*  --  133*  K 4.7   < > 4.7  --  4.7  CL 99   < > 97*  --  98  CO2 26   < > 24  --  23  GLUCOSE 225*   < > 173*  --  109*  BUN 33*   < > 46*  --  52*  CREATININE 2.99*   < > 3.70*  --  4.37*  CALCIUM 7.5*   < > 7.2*  --  7.3*  MG 2.4  --   --   --  2.5*  PHOS 1.1*   < > 7.5* 6.0*  --    < > = values in this interval not displayed.   Liver Function Tests Recent Labs    12/27/19 0448 12/27/19 0448 12/27/19 1600 12/28/19 0226  AST 169*  --   --  71*  ALT 130*  --   --  100*  ALKPHOS 307*  --   --  278*  BILITOT 0.5  --   --  0.7  PROT 5.6*  --   --  5.9*  ALBUMIN 2.3*  2.3*   < > 2.4* 2.4*   < > = values in this interval not displayed.   No results for input(s): LIPASE, AMYLASE in the last 72 hours. Cardiac Enzymes No results for input(s): CKTOTAL, CKMB, CKMBINDEX, TROPONINI in the last 72 hours.  BNP: BNP (last 3 results) No results for input(s): BNP in the last 8760 hours.  ProBNP (last 3 results) No results for input(s): PROBNP in the last 8760 hours.   D-Dimer No results for input(s): DDIMER in the last 72 hours. Hemoglobin A1C No results for input(s): HGBA1C  in the last 72 hours. Fasting Lipid Panel No results for input(s): CHOL, HDL, LDLCALC, TRIG, CHOLHDL, LDLDIRECT in the last 72 hours. Thyroid Function Tests No results for input(s): TSH, T4TOTAL, T3FREE, THYROIDAB in the last 72 hours.  Invalid input(s): FREET3  Other results:   Imaging    No results found.   Medications:     Scheduled Medications: . amiodarone  200 mg Oral Daily  . apixaban  5 mg Oral BID  . Apremilast  1 tablet Oral Daily  . aspirin EC  81 mg Oral Daily  . atorvastatin  80 mg Oral Daily  . B-complex with vitamin C  1 tablet Per Tube Daily  . bisacodyl  10 mg Oral Daily   Or  . bisacodyl  10 mg Rectal Daily  . chlorhexidine gluconate (MEDLINE KIT)  15 mL Mouth Rinse BID  . Chlorhexidine Gluconate Cloth  6 each Topical Daily  . docusate sodium  200 mg Oral Daily  . insulin aspart  0-24 Units Subcutaneous Q4H  . insulin detemir  5 Units Subcutaneous BID  . pantoprazole  40 mg Oral Daily  . sodium chloride flush  3 mL Intravenous Q12H  . tacrolimus  1 mg Oral BID  . venlafaxine XR  37.5 mg Oral Daily    Infusions: . sodium chloride Stopped (12/21/19 1602)  . dexmedetomidine (PRECEDEX) IV infusion Stopped (12/26/19 0947)  . lactated ringers Stopped (12/19/19 1448)  . lactated ringers Stopped (12/20/19 1152)  . milrinone Stopped (12/27/19 1145)    PRN Medications: Gerhardt's butt cream, insulin aspart, LORazepam, melatonin, metoprolol tartrate, ondansetron (ZOFRAN) IV, oxyCODONE, senna-docusate, sodium chloride, sodium chloride flush    Assessment/Plan   1. CAD: Severe 3VD on cath 7/21 => 60-70% ostial LAD, 80% mLAD, 95% ramus, 99% proximal LCx with TIMI-2 flow down the rest of the LCx, occluded distal RCA.  Suspect out of hospital inferolateral MI.  Cardiac MRI showed that the inferolateral wall is likely not viable, but the remainder of the LV myocardium is likely viable. He had CABG 7/16 with LIMA-LAD, RIMA-ramus, seq SVG-PD/PL, SVG-D.  -  Continue ASA/clopidogrel + atorvastatin.   - Stable. No angina - No b-blocker with shock 2. Acute on chronic systolic CHF: Echo with EF 20-25%, moderately decreased RV systolic function. Cardiac MRI with LV EF 26%, RV EF 38%, near full thickness scar in inferolateral wall  suggesting lack of viability in the LCx territory.  Now post-CABG. Repeat limited echo 7/20 showed LVEF 20-25%, RV normal, no pericardial effusion. Remains anurinc. CVVHD stopped overnight. CVP 10 today.Co-ox 60% today on milrinone 0.125.  - Stop milrinone - Volume management per Renal. Pending iHD tomorrow per report  - Continue midodrine for BP support  3. Atrial fibrillation: Patient admitted with afib/RVR. Remains in NSR. S/p Maze + LAA clipping on 7/16.  - Now on amio 200 po daily. Continue - on Eliquis now. hgb stable at 9.0 4. AKI on CKD stage 3: Creatine has been stable 1.9-2.3 range in hospital pre-op.  Was 1.3 back in 4/21. Creatinine rose to 6.46 post-op. Suspect post-op ATN. CRRT initiated 7/19. Remains anuric. CVVHD stopped Friday night - Management per Renal. Pending iHD on Monday per report 5. Type 2 diabetes: SSI.  6. Liver transplant for HCV: tacrolimus currently on hold (? Toxicity, concomitate use w/ amiodarone can increase levels). On 7/18, level was 6.6 (not sure this was true trough) and within goal range for trough.  - He has been restarted on dose of tacrolimus in discussion with his transplant team.  - treated w/ stress dose steroids but discontinued yesterday (not on steroids at home), - no change.  - will need repeat TAC level. D/w PharmD.  7. ID: 7/19 developed hypotension w/ increasing pressor support, increasing leukocytosis and CXR w/ hazy opacities. Started on empiric abx for ? HCAP with vancomycin/cefepime => now off. BC NGTD. WBCs decreasing. - no change   Continue to mobilize with PT. Can transfer out of ICU once tolerating iHD.  Glori Bickers, MD 12/28/2019 9:16 AM

## 2019-12-28 NOTE — Progress Notes (Signed)
Patient ID: Joseph Greer, male   DOB: January 29, 1956, 64 y.o.   MRN: [id number] TCTS DAILY ICU PROGRESS NOTE                   [address]            [place] [address]          [phone]   9 Days Post-Op Procedure(s) (LRB): CORONARY ARTERY BYPASS GRAFTING (CABG) TIMES  X 5,   USING BILATERAL MAMMARY ARTERY AND RIGHT LEG GREATER SAPHENOUS VEIN HARVESTED ENDOSCOPICALLY (N/A) TRANSESOPHAGEAL ECHOCARDIOGRAM (TEE) (N/A) INDOCYANINE GREEN FLUORESCENCE IMAGING (ICG) (N/A) MAZE (N/A) CLIPPING OF ATRIAL APPENDAGE USING ATRICURE CLIP SIZE 50MM (N/A)  Total Length of Stay:  LOS: 16 days   Subjective: Patient awake alert, sitting in chair, just completing eating all of his breakfast that was given him.  Notes that he has been ambulating better  Objective: Vital signs in last 24 hours: Temp:  [97.6 F (36.4 C)-98.4 F (36.9 C)] 97.7 F (36.5 C) (07/25 0746) Pulse Rate:  [78-94] 81 (07/25 0800) Cardiac Rhythm: Normal sinus rhythm (07/25 0400) Resp:  [12-34] 20 (07/25 0800) BP: (97-120)/(49-71) 103/60 (07/25 0800) SpO2:  [92 %-99 %] 94 % (07/25 0800) Weight:  [86.1 kg] 86.1 kg (07/25 0500)  Filed Weights   12/26/19 0500 12/27/19 0459 12/28/19 0500  Weight: 84.4 kg 85.9 kg 86.1 kg    Weight change: 0.2 kg   Hemodynamic parameters for last 24 hours: CVP:  [13 mmHg-16 mmHg] 15 mmHg  Intake/Output from previous day: 07/24 0701 - 07/25 0700 In: 1173.1 [P.O.:540; I.V.:15; NG/GT:450; IV Piggyback:168.1] Out: -   Intake/Output this shift: No intake/output data recorded.  Current Meds: Scheduled Meds: . amiodarone  200 mg Oral Daily  . apixaban  5 mg Oral BID  . Apremilast  1 tablet Oral Daily  . aspirin EC  81 mg Oral Daily  . atorvastatin  80 mg Oral Daily  . B-complex with vitamin C  1 tablet Per Tube Daily  . bisacodyl  10 mg Oral Daily   Or  . bisacodyl  10 mg Rectal Daily  . chlorhexidine gluconate (MEDLINE KIT)  15 mL Mouth Rinse BID  .  Chlorhexidine Gluconate Cloth  6 each Topical Daily  . docusate sodium  200 mg Oral Daily  . feeding supplement (PROSource TF)  45 mL Per Tube BID  . insulin aspart  0-24 Units Subcutaneous Q4H  . insulin detemir  5 Units Subcutaneous BID  . pantoprazole  40 mg Oral Daily  . sodium chloride flush  3 mL Intravenous Q12H  . tacrolimus  1 mg Oral BID  . venlafaxine XR  37.5 mg Oral Daily   Continuous Infusions: . sodium chloride Stopped (12/21/19 1602)  . dexmedetomidine (PRECEDEX) IV infusion Stopped (12/26/19 0947)  . feeding supplement (VITAL 1.5 CAL) 1,000 mL (12/27/19 0312)  . lactated ringers Stopped (12/19/19 1448)  . lactated ringers Stopped (12/20/19 1152)  . milrinone Stopped (12/27/19 1145)   PRN Meds:.Laruen Risser's butt cream, insulin aspart, LORazepam, melatonin, metoprolol tartrate, ondansetron (ZOFRAN) IV, oxyCODONE, senna-docusate, sodium chloride, sodium chloride flush  General appearance: alert, cooperative, appears stated age and no distress Neurologic: intact Heart: regular rate and rhythm, S1, S2 normal, no murmur, click, rub or gallop Lungs: diminished breath sounds bibasilar Abdomen: soft, non-tender; bowel sounds normal; no masses,  no organomegaly Extremities: extremities normal, atraumatic, no cyanosis or edema and Homans sign is negative, no sign of DVT Wound: Sternum intact  Lab Results: CBC: Recent Labs    12/27/19 0448 12/28/19 0226  WBC 13.1* 16.5*  HGB 8.8* 9.0*  HCT 28.7* 29.0*  PLT 158 186   BMET:  Recent Labs    12/27/19 1600 12/28/19 0226  NA 134* 133*  K 4.7 4.7  CL 97* 98  CO2 24 23  GLUCOSE 173* 109*  BUN 46* 52*  CREATININE 3.70* 4.37*  CALCIUM 7.2* 7.3*    CMET: Lab Results  Component Value Date   WBC 16.5 (H) 12/28/2019   HGB 9.0 (L) 12/28/2019   HCT 29.0 (L) 12/28/2019   PLT 186 12/28/2019   GLUCOSE 109 (H) 12/28/2019   CHOL 219 (H) 12/12/2019   TRIG 117 12/12/2019   HDL 38 (L) 12/12/2019   LDLCALC 158 (H) 12/12/2019    ALT 100 (H) 12/28/2019   AST 71 (H) 12/28/2019   NA 133 (L) 12/28/2019   K 4.7 12/28/2019   CL 98 12/28/2019   CREATININE 4.37 (H) 12/28/2019   BUN 52 (H) 12/28/2019   CO2 23 12/28/2019   TSH 4.855 (H) 12/11/2019   INR 1.4 (H) 12/19/2019   HGBA1C 5.9 (H) 12/11/2019      PT/INR: No results for input(s): LABPROT, INR in the last 72 hours. Radiology: No results found.   Assessment/Plan: S/P Procedure(s) (LRB): CORONARY ARTERY BYPASS GRAFTING (CABG) TIMES  X 5,   USING BILATERAL MAMMARY ARTERY AND RIGHT LEG GREATER SAPHENOUS VEIN HARVESTED ENDOSCOPICALLY (N/A) TRANSESOPHAGEAL ECHOCARDIOGRAM (TEE) (N/A) INDOCYANINE GREEN FLUORESCENCE IMAGING (ICG) (N/A) MAZE (N/A) CLIPPING OF ATRIAL APPENDAGE USING ATRICURE CLIP SIZE 50MM (N/A) Off drips, taking p.o. diet well Progressive elevation of creatinine as CVVH stopped-likely will require hemodialysis tomorrow We will DC feeding tube as patient is able to take p.o. diet without difficulty Currently on full dose apixaban Wounds intact without evidence of infection We will need to address longer-term line placement if continued dialysis required   Joseph Greer 12/28/2019 8:17 AM

## 2019-12-28 NOTE — Progress Notes (Signed)
[facility]on Kot[facility] Admit Date: 12/11/2019 12/28/2019 [name] Requesting Physician:  [name] MD  Reason for Consult:  AKI on CKD3 SUBJECTIVE Remains anuric.  Hemodynamically stable.  No acute events.  Patient reports his appetite is low but better and his breathing is stable.  Denies any nausea/vomiting.   Creatinine, Ser (mg/dL)  Date Value  12/28/2019 4.37 (H)  12/27/2019 3.70 (H)  12/27/2019 2.99 (H)  12/26/2019 1.99 (H)  12/26/2019 2.16 (H)  12/25/2019 2.48 (H)  12/25/2019 2.54 (H)  12/24/2019 2.61 (H)  12/24/2019 3.01 (H)  12/23/2019 3.24 (H)  ] I/Os: I/O last 3 completed shifts: In: 2063.9 [P.O.:540; I.V.:88.9; NG/GT:1170; IV Piggyback:265] Out: 227 [Other:227]   ROS Balance of 12 systems is negative w/ exceptions as above  PMH  Past Medical History:  Diagnosis Date  . Anxiety   . CKD (chronic kidney disease), stage III   . Depression   . Diabetes mellitus type 2, noninsulin dependent ([place])   . Hepatitis C   . Hypertension   . Psoriasis   . Status post liver transplant ([place])    [place]  Past Surgical History:  Procedure Laterality Date  . CLIPPING OF ATRIAL APPENDAGE N/A 12/19/2019   Procedure: CLIPPING OF ATRIAL APPENDAGE USING ATRICURE CLIP SIZE 50MM;  Surgeon: Wonda Olds, MD;  Location: [facility];  Service: Open Heart Surgery;  Laterality: N/A;  . COLONOSCOPY    . COLONOSCOPY WITH PROPOFOL N/A 06/01/2017   Procedure: COLONOSCOPY WITH PROPOFOL;  Surgeon: Rogene Houston, MD;  [facility];  Service: Endoscopy;  Laterality: N/A;  7:30  . CORONARY ARTERY BYPASS GRAFT N/A 12/19/2019   Procedure: CORONARY ARTERY BYPASS GRAFTING (CABG) TIMES  X 5,   USING BILATERAL MAMMARY ARTERY AND RIGHT LEG GREATER SAPHENOUS VEIN HARVESTED ENDOSCOPICALLY;  Surgeon: Wonda Olds, MD;  Location: [facility];  Service: Open Heart Surgery;  Laterality: N/A;  . HERNIA REPAIR Right   . LEFT HEART CATH AND CORONARY ANGIOGRAPHY N/A 12/12/2019   Procedure: LEFT HEART CATH  AND CORONARY ANGIOGRAPHY;  Surgeon: Jettie Booze, MD;  Location: [facility];  Service: Cardiovascular;  Laterality: N/A;  . LIVER BIOPSY    . LIVER SURGERY    . MAZE N/A 12/19/2019   Procedure: MAZE;  Surgeon: Wonda Olds, MD;  Location: [facility];  Service: Open Heart Surgery;  Laterality: N/A;  . POLYPECTOMY  06/01/2017   Procedure: POLYPECTOMY;  Surgeon: Rogene Houston, MD;  [facility];  Service: Endoscopy;;  polyp at sigmoid colon x2, ascending colon polyp, hepatic flexure polyp  . RIGHT HEART CATH N/A 12/17/2019   Procedure: RIGHT HEART CATH;  Surgeon: Larey Dresser, MD;  Location: [place];  Service: Cardiovascular;  Laterality: N/A;  . TEE WITHOUT CARDIOVERSION N/A 12/19/2019   Procedure: TRANSESOPHAGEAL ECHOCARDIOGRAM (TEE);  Surgeon: Wonda Olds, MD;  Location: [facility];  Service: Open Heart Surgery;  Laterality: N/A;  . UPPER GASTROINTESTINAL ENDOSCOPY     FH  Family History  Problem Relation Age of Onset  . Diabetes Mother   . Heart disease Father   . Heart disease Son    [place]  reports that he quit smoking about 12 years ago. His smoking use included cigarettes. He smoked 1.00 pack per day. He has never used smokeless tobacco. He reports that he does not drink alcohol and does not use drugs. Allergies No Known Allergies Home medications Prior to Admission medications   Medication Sig Start Date End  Date Taking? Authorizing Provider  amLODipine (NORVASC) 5 MG tablet Take 5 mg by mouth daily. 10/28/19  Yes [provider]  Apremilast (OTEZLA) 30 MG TABS Take 1 tablet by mouth 2 (two) times daily. 06/16/19  Yes [provider]  LORazepam (ATIVAN) 1 MG tablet Take 1 mg by mouth 2 (two) times daily as needed (panic attacks).  11/28/19  Yes [provider]  nitroGLYCERIN (NITROSTAT) 0.4 MG SL tablet Place 1 tablet (0.4 mg total) under the tongue every 5 (five) minutes x 3 doses as needed for chest pain. 03/27/13  Yes  Rai, Ripudeep K, MD  tacrolimus (PROGRAF) 1 MG capsule Take 2 mg by mouth 2 (two) times daily.    Yes [provider]  venlafaxine XR (EFFEXOR-XR) 75 MG 24 hr capsule Take 75 mg by mouth daily.   Yes [provider]  buPROPion (WELLBUTRIN) 75 MG tablet Take 75 mg by mouth 2 (two) times daily.  Patient not taking: Reported on 12/11/2019    [provider]  FLUoxetine (PROZAC) 40 MG capsule Take 40 mg by mouth daily. Patient not taking: Reported on 12/11/2019    [provider]  lisinopril (PRINIVIL,ZESTRIL) 20 MG tablet Take 20 mg by mouth daily. Patient not taking: Reported on 12/11/2019    [provider]  mycophenolate (CELLCEPT) 250 MG capsule Take 250 mg by mouth 2 (two) times daily.  Patient not taking: Reported on 12/11/2019    [provider]  triamcinolone ointment (KENALOG) 0.1 % Apply 1 application topically 2 (two) times daily. Patient not taking: Reported on 12/11/2019    [provider]    Current Medications Scheduled Meds: . amiodarone  200 mg Oral Daily  . apixaban  5 mg Oral BID  . Apremilast  1 tablet Oral Daily  . aspirin EC  81 mg Oral Daily  . atorvastatin  80 mg Oral Daily  . B-complex with vitamin C  1 tablet Per Tube Daily  . bisacodyl  10 mg Oral Daily   Or  . bisacodyl  10 mg Rectal Daily  . chlorhexidine gluconate (MEDLINE KIT)  15 mL Mouth Rinse BID  . Chlorhexidine Gluconate Cloth  6 each Topical Daily  . docusate sodium  200 mg Oral Daily  . insulin aspart  0-24 Units Subcutaneous Q4H  . insulin detemir  5 Units Subcutaneous BID  . pantoprazole  40 mg Oral Daily  . sodium chloride flush  3 mL Intravenous Q12H  . tacrolimus  1 mg Oral BID  . venlafaxine XR  37.5 mg Oral Daily   Continuous Infusions: . sodium chloride Stopped (12/21/19 1602)  . lactated ringers Stopped (12/19/19 1448)  . lactated ringers Stopped (12/20/19 1152)   PRN Meds:.Gerhardt's butt cream, insulin aspart, LORazepam,  melatonin, metoprolol tartrate, ondansetron (ZOFRAN) IV, oxyCODONE, senna-docusate, sodium chloride, sodium chloride flush  CBC Recent Labs  Lab 12/26/19 0347 12/27/19 0448 12/28/19 0226  WBC 9.6 13.1* 16.5*  HGB 8.6* 8.8* 9.0*  HCT 27.7* 28.7* 29.0*  MCV 84.2 84.7 83.6  PLT 137* 158 500   Basic Metabolic Panel Recent Labs  Lab 12/25/19 0429 12/25/19 0429 12/25/19 1604 12/25/19 1604 12/26/19 0340 12/26/19 1606 12/26/19 2044 12/26/19 2321 12/27/19 0448 12/27/19 1600 12/27/19 2000 12/28/19 0226  NA 133*  --  133*  --  135 132*  --   --  133* 134*  --  133*  K 4.4  --  4.0  --  4.3 4.3  --   --  4.7  4.7  --  4.7  CL 99  --  99  --  101 98  --   --  99 97*  --  98  CO2 24  --  25  --  27 24  --   --  26 24  --  23  GLUCOSE 223*  --  220*  --  206* 274*  --   --  225* 173*  --  109*  BUN 27*  --  27*  --  23 22  --   --  33* 46*  --  52*  CREATININE 2.54*  --  2.48*  --  2.16* 1.99*  --   --  2.99* 3.70*  --  4.37*  CALCIUM 7.9*  --  7.6*  --  7.5* 7.5*  --   --  7.5* 7.2*  --  7.3*  PHOS 1.5*   < > 1.2*   < > <1.0* 4.0 1.1* <1.0* 1.1* 7.5* 6.0*  --    < > = values in this interval not displayed.    Physical Exam  Blood pressure (!) 115/64, pulse 81, temperature 98.2 F (36.8 C), temperature source Oral, resp. rate 17, height 5' 10"  (1.778 m), weight 86.1 kg, SpO2 96 %. GEN: NAD, awake Neck: LIJ temp dialysis catheter: c/d/i CV: rrr, normal S1-S2 PULM: on ra, unlabored, cta bl, no overt w/r/r/c ABD: Soft, nontender nondistended SKIN: No rashes or lesions , midchest wall incision c/d/i EXT: no edema NEURO: Speech clear and coherent, awake, following commands   Assessment 89M with oliguric AoCKD3 in setting of LHF 7/9 and 5V CABG 7/16 + MAZE.  Suspect this is ATN following cardiac surgery  1. AoCKD3 (now anuric), BL SCr 1.2 to 1.5, no outpt nephrology; suspect ATN; exhibiting early uremic symptoms with loss of appetite along with nausea/vomiting (Cr continues to  rise). No response in urine output with diuretics initially.  Required CRRT from 7/19-7/23 2. NSTEMI status post 5 vessel CABG 7/16 3. Atrial fibrillation status post maze with left atrial appendage clipping 7/16.  On amiodarone 4. Acute on chronic sCHF/cardiogenic shock on epinephrine and milrinone 5. Status post liver transplant 2009 ([facility]) on tacrolimus followed by [facility] 6. Hypertension 7. DM2 8. Delirium, critical care delirium?  Plan 1. No indication for renal replacement therapy for today.  Plan for hemodialysis tomorrow and will maintain on a Monday Wednesday Friday schedule 2. IR consult for tunneled dialysis catheter placement, anticipating that he will need long-term dialysis (at least for now) 3. Will continue to monitor for renal recovery moving forward 4. Daily weights, Strict I/Os, Avoid nephrotoxins (NSAIDs, judicious IV Contrast)   [name], MD [facility] 12/28/2019, 1:36 PM

## 2019-12-28 NOTE — Progress Notes (Signed)
Patient ID: Joseph Greer, male   DOB: Feb 03, 1956, 64 y.o.   MRN: [id number] EVENING ROUNDS NOTE :     [address]       [place] [address]             [phone]                 9 Days Post-Op Procedure(s) (LRB): CORONARY ARTERY BYPASS GRAFTING (CABG) TIMES  X 5,   USING BILATERAL MAMMARY ARTERY AND RIGHT LEG GREATER SAPHENOUS VEIN HARVESTED ENDOSCOPICALLY (N/A) TRANSESOPHAGEAL ECHOCARDIOGRAM (TEE) (N/A) INDOCYANINE GREEN FLUORESCENCE IMAGING (ICG) (N/A) MAZE (N/A) CLIPPING OF ATRIAL APPENDAGE USING ATRICURE CLIP SIZE 50MM (N/A)  Total Length of Stay:  LOS: 16 days  BP 114/66   Pulse 80   Temp 97.9 F (36.6 C) (Oral)   Resp 23   Ht 5\' 10"  (1.778 m)   Wt 86.1 kg   SpO2 94%   BMI 27.24 kg/m   .Intake/Output      07/24 0701 - 07/25 0700 07/25 0701 - 07/26 0700   P.O. 540 480   I.V. (mL/kg) 15 (0.2)    NG/GT 450    IV Piggyback 168.1    Total Intake(mL/kg) 1173.1 (13.6) 480 (5.6)   Urine (mL/kg/hr)  250 (0.2)   Other     Total Output  250   Net +1173.1 +230        Stool Occurrence 2 x      . sodium chloride Stopped (12/21/19 1602)  . lactated ringers Stopped (12/19/19 1448)  . lactated ringers Stopped (12/20/19 1152)     Lab Results  Component Value Date   WBC 16.5 (H) 12/28/2019   HGB 9.0 (L) 12/28/2019   HCT 29.0 (L) 12/28/2019   PLT 186 12/28/2019   GLUCOSE 143 (H) 12/28/2019   CHOL 219 (H) 12/12/2019   TRIG 117 12/12/2019   HDL 38 (L) 12/12/2019   LDLCALC 158 (H) 12/12/2019   ALT 100 (H) 12/28/2019   AST 71 (H) 12/28/2019   NA 134 (L) 12/28/2019   K 5.1 12/28/2019   CL 100 12/28/2019   CREATININE 5.01 (H) 12/28/2019   BUN 60 (H) 12/28/2019   CO2 23 12/28/2019   TSH 4.855 (H) 12/11/2019   INR 1.4 (H) 12/19/2019   HGBA1C 5.9 (H) 12/11/2019   Stable day For dialysis cath in am   [name] MD  Beeper [phone] Office [phone] 12/28/2019 6:53 PM

## 2019-12-29 ENCOUNTER — Inpatient Hospital Stay (HOSPITAL_COMMUNITY): Payer: Medicare HMO

## 2019-12-29 DIAGNOSIS — Z992 Dependence on renal dialysis: Secondary | ICD-10-CM

## 2019-12-29 LAB — CBC
HCT: 27.9 % — ABNORMAL LOW (ref 39.0–52.0)
Hemoglobin: 8.6 g/dL — ABNORMAL LOW (ref 13.0–17.0)
MCH: 26 pg (ref 26.0–34.0)
MCHC: 30.8 g/dL (ref 30.0–36.0)
MCV: 84.3 fL (ref 80.0–100.0)
Platelets: 195 10*3/uL (ref 150–400)
RBC: 3.31 MIL/uL — ABNORMAL LOW (ref 4.22–5.81)
RDW: 16.6 % — ABNORMAL HIGH (ref 11.5–15.5)
WBC: 15.9 10*3/uL — ABNORMAL HIGH (ref 4.0–10.5)
nRBC: 0 % (ref 0.0–0.2)

## 2019-12-29 LAB — MAGNESIUM: Magnesium: 2.4 mg/dL (ref 1.7–2.4)

## 2019-12-29 LAB — COOXEMETRY PANEL
Carboxyhemoglobin: 1.9 % — ABNORMAL HIGH (ref 0.5–1.5)
Methemoglobin: 1.1 % (ref 0.0–1.5)
O2 Saturation: 55.8 %
Total hemoglobin: 9 g/dL — ABNORMAL LOW (ref 12.0–16.0)

## 2019-12-29 LAB — COMPREHENSIVE METABOLIC PANEL
ALT: 69 U/L — ABNORMAL HIGH (ref 0–44)
AST: 37 U/L (ref 15–41)
Albumin: 2.3 g/dL — ABNORMAL LOW (ref 3.5–5.0)
Alkaline Phosphatase: 231 U/L — ABNORMAL HIGH (ref 38–126)
Anion gap: 13 (ref 5–15)
BUN: 68 mg/dL — ABNORMAL HIGH (ref 8–23)
CO2: 22 mmol/L (ref 22–32)
Calcium: 7.4 mg/dL — ABNORMAL LOW (ref 8.9–10.3)
Chloride: 97 mmol/L — ABNORMAL LOW (ref 98–111)
Creatinine, Ser: 5.6 mg/dL — ABNORMAL HIGH (ref 0.61–1.24)
GFR calc Af Amer: 12 mL/min — ABNORMAL LOW (ref >60)
GFR calc non Af Amer: 10 mL/min — ABNORMAL LOW (ref >60)
Glucose, Bld: 104 mg/dL — ABNORMAL HIGH (ref 70–99)
Potassium: 5.3 mmol/L — ABNORMAL HIGH (ref 3.5–5.1)
Sodium: 132 mmol/L — ABNORMAL LOW (ref 135–145)
Total Bilirubin: 0.8 mg/dL (ref 0.3–1.2)
Total Protein: 5.7 g/dL — ABNORMAL LOW (ref 6.5–8.1)

## 2019-12-29 LAB — TACROLIMUS LEVEL: Tacrolimus (FK506) - LabCorp: 3 ng/mL (ref 2.0–20.0)

## 2019-12-29 LAB — GLUCOSE, CAPILLARY
Glucose-Capillary: 105 mg/dL — ABNORMAL HIGH (ref 70–99)
Glucose-Capillary: 108 mg/dL — ABNORMAL HIGH (ref 70–99)
Glucose-Capillary: 109 mg/dL — ABNORMAL HIGH (ref 70–99)
Glucose-Capillary: 162 mg/dL — ABNORMAL HIGH (ref 70–99)
Glucose-Capillary: 125 mg/dL — ABNORMAL HIGH (ref 70–99)

## 2019-12-29 LAB — HEPATITIS B CORE ANTIBODY, TOTAL: Hep B Core Total Ab: NONREACTIVE

## 2019-12-29 LAB — PROTIME-INR
INR: 1.4 — ABNORMAL HIGH (ref 0.8–1.2)
Prothrombin Time: 16.2 seconds — ABNORMAL HIGH (ref 11.4–15.2)

## 2019-12-29 LAB — HEPATITIS B SURFACE ANTIBODY,QUALITATIVE: Hep B S Ab: NONREACTIVE

## 2019-12-29 LAB — HEPATITIS B SURFACE ANTIGEN: Hepatitis B Surface Ag: NONREACTIVE

## 2019-12-29 MED ORDER — LIDOCAINE-PRILOCAINE 2.5-2.5 % EX CREA
1.0000 "application " | TOPICAL_CREAM | CUTANEOUS | Status: DC | PRN
  Filled 2019-12-29: qty 5

## 2019-12-29 MED ORDER — HEPARIN SODIUM (PORCINE) 1000 UNIT/ML DIALYSIS
1000.0000 [IU] | INTRAMUSCULAR | Status: DC | PRN
  Filled 2019-12-29: qty 1

## 2019-12-29 MED ORDER — HEPARIN SODIUM (PORCINE) 1000 UNIT/ML IJ SOLN
INTRAMUSCULAR | Status: AC
  Administered 2019-12-29: 2800 [IU] via INTRAVENOUS_CENTRAL
  Filled 2019-12-29: qty 4

## 2019-12-29 MED ORDER — RENA-VITE PO TABS
1.0000 | ORAL_TABLET | Freq: Every day | ORAL | Status: DC
  Administered 2019-12-29 – 2019-12-31 (×3): 1 via ORAL
  Filled 2019-12-29 (×3): qty 1

## 2019-12-29 MED ORDER — TRAMADOL HCL 50 MG PO TABS
50.0000 mg | ORAL_TABLET | Freq: Two times a day (BID) | ORAL | Status: DC | PRN
  Administered 2019-12-31: 50 mg via ORAL
  Filled 2019-12-29: qty 1

## 2019-12-29 MED ORDER — ALTEPLASE 2 MG IJ SOLR
2.0000 mg | Freq: Once | INTRAMUSCULAR | Status: DC | PRN
  Filled 2019-12-29: qty 2

## 2019-12-29 MED ORDER — LIDOCAINE HCL (PF) 1 % IJ SOLN: 5.0000 mL | INTRAMUSCULAR | Status: DC | PRN

## 2019-12-29 MED ORDER — PENTAFLUOROPROP-TETRAFLUOROETH EX AERO
1.0000 "application " | INHALATION_SPRAY | CUTANEOUS | Status: DC | PRN
  Filled 2019-12-29: qty 116

## 2019-12-29 MED ORDER — SODIUM CHLORIDE 0.9 % IV SOLN: 100.0000 mL | INTRAVENOUS | Status: DC | PRN

## 2019-12-29 MED ORDER — HYDROMORPHONE HCL 2 MG PO TABS
1.0000 mg | ORAL_TABLET | Freq: Four times a day (QID) | ORAL | Status: DC | PRN
  Administered 2019-12-29 – 2019-12-30 (×2): 1 mg via ORAL
  Filled 2019-12-29 (×3): qty 1

## 2019-12-29 MED ORDER — ENSURE ENLIVE PO LIQD
237.0000 mL | Freq: Two times a day (BID) | ORAL | Status: DC
  Administered 2019-12-31 – 2020-01-01 (×2): 237 mL via ORAL

## 2019-12-29 NOTE — Progress Notes (Addendum)
Patient ID: Joseph Greer, male   DOB: [birth year], 64 y.o.   MRN: [id number]     Advanced Heart Failure Rounding Note  PCP-Cardiologist: Candee Furbish, MD  AHF:  Dr. [name]   Patient Profile   64 y/o male w/ h/o Hep C s/p liver transplant in 2009 (followed at [facility]), Stage III CKD, HTN and T2DM admitted w/ NSTEMI and Afib w/ RVR, found to have severe 3VD and biventricular heart failure and ? PFO, LVEF 20-25%, RV moderately reduced. S/p CABG + MAZE + LA appendage clipping 7/16.    Subjective:    - 7/16: CABG x 5 with LIMA-LAD, RIMA-ramus, seq SVG-PD/PL, SVG-D; Maze; LA appendage clipping. - 7/18 Limited echo: LVEF 35-40%. RV normal. No pericardial effusion.  - Post operative course c/b AKI and oliguria. CRRT initiated 7/19.  - 7/19 Milrinone discontinued>>changed to dobutamine. Developed hypotension w/ increase in pressor support. NE and VP added. Empiric abx added for ?HCAP.  Became increasingly tachycardic and dobutamine stopped overnight.  - CRRT stopped 7/23>>iHD MWF  Milrinone discontinued yesterday. Co-ox 56%.   Some urine production but oliguric, 250 cc out yesterday. Scheduled to start iHD today. K 5.3.   No cardiac complaints.     Objective:   Weight Range: 86.4 kg Body mass index is 27.33 kg/m.   Vital Signs:   Temp:  [97.7 F (36.5 C)-98.2 F (36.8 C)] 97.9 F (36.6 C) (07/26 0300) Pulse Rate:  [72-81] 77 (07/26 0700) Resp:  [12-27] 24 (07/26 0700) BP: (96-119)/(56-69) 115/64 (07/26 0700) SpO2:  [91 %-97 %] 95 % (07/26 0700) Weight:  [86.4 kg] 86.4 kg (07/26 0500) Last BM Date: 12/27/19  Weight change: Filed Weights   12/27/19 0459 12/28/19 0500 12/29/19 0500  Weight: 85.9 kg 86.1 kg 86.4 kg    Intake/Output:   Intake/Output Summary (Last 24 hours) at 12/29/2019 0717 Last data filed at 12/28/2019 1700 Gross per 24 hour  Intake 480 ml  Output 250 ml  Net 230 ml      Physical Exam    General: Sitting up in bed. No resp difficulty HEENT:  normal Neck: supple. JVP ~9 cm LIJ trialysis Carotids 2+ bilat; no bruits. No lymphadenopathy or thryomegaly appreciated. Cor: PMI nondisplaced. Regular rate & rhythm. No rubs, gallops or murmurs. Lungs: clear, no wheezing  Abdomen: soft, nontender, nondistended. No hepatosplenomegaly. No bruits or masses. Good bowel sounds. Extremities: no cyanosis, clubbing, rash, trace edema Neuro: alert & orientedx3, cranial nerves grossly intact. moves all 4 extremities w/o difficulty. Affect pleasant   Telemetry   NSR 70s, No VT Personally reviewed   Labs    CBC Recent Labs    12/28/19 0226 12/29/19 0240  WBC 16.5* 15.9*  HGB 9.0* 8.6*  HCT 29.0* 27.9*  MCV 83.6 84.3  PLT 186 381   Basic Metabolic Panel Recent Labs    12/27/19 1600 12/27/19 2000 12/28/19 0226 12/28/19 0226 12/28/19 1600 12/29/19 0240  NA   < >  --  133*   < > 134* 132*  K   < >  --  4.7   < > 5.1 5.3*  CL   < >  --  98   < > 100 97*  CO2   < >  --  23   < > 23 22  GLUCOSE   < >  --  109*   < > 143* 104*  BUN   < >  --  52*   < > 60* 68*  CREATININE   < >  --  4.37*   < > 5.01* 5.60*  CALCIUM   < >  --  7.3*   < > 7.1* 7.4*  MG  --   --  2.5*  --   --  2.4  PHOS  --  6.0*  --   --  7.1*  --    < > = values in this interval not displayed.   Liver Function Tests Recent Labs    12/28/19 0226 12/28/19 0226 12/28/19 1600 12/29/19 0240  AST 71*  --   --  37  ALT 100*  --   --  69*  ALKPHOS 278*  --   --  231*  BILITOT 0.7  --   --  0.8  PROT 5.9*  --   --  5.7*  ALBUMIN 2.4*   < > 2.3* 2.3*   < > = values in this interval not displayed.   No results for input(s): LIPASE, AMYLASE in the last 72 hours. Cardiac Enzymes No results for input(s): CKTOTAL, CKMB, CKMBINDEX, TROPONINI in the last 72 hours.  BNP: BNP (last 3 results) No results for input(s): BNP in the last 8760 hours.  ProBNP (last 3 results) No results for input(s): PROBNP in the last 8760 hours.   D-Dimer No results for input(s):  DDIMER in the last 72 hours. Hemoglobin A1C No results for input(s): HGBA1C in the last 72 hours. Fasting Lipid Panel No results for input(s): CHOL, HDL, LDLCALC, TRIG, CHOLHDL, LDLDIRECT in the last 72 hours. Thyroid Function Tests No results for input(s): TSH, T4TOTAL, T3FREE, THYROIDAB in the last 72 hours.  Invalid input(s): FREET3  Other results:   Imaging    No results found.   Medications:     Scheduled Medications: . amiodarone  200 mg Oral Daily  . apixaban  5 mg Oral BID  . Apremilast  1 tablet Oral Daily  . aspirin EC  81 mg Oral Daily  . atorvastatin  80 mg Oral Daily  . B-complex with vitamin C  1 tablet Per Tube Daily  . bisacodyl  10 mg Oral Daily   Or  . bisacodyl  10 mg Rectal Daily  . chlorhexidine gluconate (MEDLINE KIT)  15 mL Mouth Rinse BID  . Chlorhexidine Gluconate Cloth  6 each Topical Daily  . docusate sodium  200 mg Oral Daily  . heparin sodium (porcine)      . insulin aspart  0-24 Units Subcutaneous Q4H  . insulin detemir  5 Units Subcutaneous BID  . pantoprazole  40 mg Oral Daily  . sodium chloride flush  3 mL Intravenous Q12H  . tacrolimus  1 mg Oral BID  . venlafaxine XR  37.5 mg Oral Daily    Infusions: . sodium chloride Stopped (12/21/19 1602)  . sodium chloride    . sodium chloride    . lactated ringers Stopped (12/19/19 1448)  . lactated ringers Stopped (12/20/19 1152)    PRN Medications: sodium chloride, sodium chloride, alteplase, Gerhardt's butt cream, heparin, insulin aspart, lidocaine (PF), lidocaine-prilocaine, LORazepam, melatonin, metoprolol tartrate, ondansetron (ZOFRAN) IV, oxyCODONE, pentafluoroprop-tetrafluoroeth, senna-docusate, sodium chloride, sodium chloride flush    Assessment/Plan   1. CAD: Severe 3VD on cath 7/21 => 60-70% ostial LAD, 80% mLAD, 95% ramus, 99% proximal LCx with TIMI-2 flow down the rest of the LCx, occluded distal RCA.  Suspect out of hospital inferolateral MI.  Cardiac MRI showed that the  inferolateral wall is likely not viable, but the remainder of the LV myocardium is likely viable. He had  CABG 7/16 with LIMA-LAD, RIMA-ramus, seq SVG-PD/PL, SVG-D.  - Continue ASA + atorvastatin.   - Stable. No angina - No b-blocker with shock 2. Acute on chronic systolic CHF: Echo with EF 20-25%, moderately decreased RV systolic function. Cardiac MRI with LV EF 26%, RV EF 38%, near full thickness scar in inferolateral wall suggesting lack of viability in the LCx territory.  Now post-CABG. Repeat limited echo 7/20 showed LVEF 20-25%, RV normal, no pericardial effusion. Remains oliguric. CVVHD stopped 7/23. Now on iHD, MWF for volume control (5lb above dry wt today). Off inotropes/ pressors. Milrinone stopped 7/25. Co-ox 56% today  - Volume management per Renal.  iHD MWF - Now off midodrine.  - no ARB/ARNI, dig nor spiro w/ renal failure - consider future addition of Bidil if BP allows.  3. Atrial fibrillation: Patient admitted with afib/RVR. Remains in NSR. S/p Maze + LAA clipping on 7/16.  - Now on amio 200 po daily. - on Eliquis now. hgb stable at 8.6 4. AKI on CKD stage 3: Creatine has been stable 1.9-2.3 range in hospital pre-op.  Was 1.3 back in 4/21. Creatinine rose to 6.46 post-op. Suspect post-op ATN. CRRT initiated 7/19. Remains oliguric. CVVHD stopped 7/23 - Management per Renal. iHD today 5. Type 2 diabetes: SSI.  6. Liver transplant for HCV: tacrolimus currently on hold (? Toxicity, concomitate use w/ amiodarone can increase levels). On 7/18, level was 6.6 (not sure this was true trough) and within goal range for trough.  - He has been restarted on dose of tacrolimus in discussion with his transplant team.  - treated w/ stress dose steroids but discontinued yesterday (not on steroids at home), - no change.  - will need repeat TAC level. D/w PharmD.  7. ID: 7/19 developed hypotension w/ increasing pressor support, increasing leukocytosis and CXR w/ hazy opacities. Started on empiric  abx for ? HCAP with vancomycin/cefepime => now off. BC NGTD. WBCs decreasing. - no change  8. Hyperkalemia - K 5.3 in the setting of renal failure - HD per nephrology   Lyda Jester, PA-C 12/29/2019 7:17 AM  Patient seen with PA, agree with the above note.   He is on HD this morning.  Off milrinone and midodrine.  Walked in hall.   General: NAD Neck: JVP 10 cm, no thyromegaly or thyroid nodule.  Lungs: Clear to auscultation bilaterally with normal respiratory effort. CV: Nondisplaced PMI.  Heart regular S1/S2, no S3/S4, no murmur.  No peripheral edema.   Abdomen: Soft, nontender, no hepatosplenomegaly, no distention.  Skin: Intact without lesions or rashes.  Neurologic: Alert and oriented x 3.  Psych: Normal affect. Extremities: No clubbing or cyanosis.  HEENT: Normal.   Ongoing HD need, will have to get tunneled catheter.  Can hold Eliquis as needed per IR's discretion.   Off milrinone and midodrine, stable MAP.   Continue to mobilize, walk in hall.   Loralie Champagne 12/29/2019 8:32 AM

## 2019-12-29 NOTE — Plan of Care (Signed)
  Problem: Education: Goal: Knowledge of General Education information will improve Description: Including pain rating scale, medication(s)/side effects and non-pharmacologic comfort measures Outcome: Progressing   Problem: Health Behavior/Discharge Planning: Goal: Ability to manage health-related needs will improve Outcome: Progressing   Problem: Clinical Measurements: Goal: Ability to maintain clinical measurements within normal limits will improve Outcome: Progressing Goal: Will remain free from infection Outcome: Progressing Goal: Diagnostic test results will improve Outcome: Progressing Goal: Respiratory complications will improve Outcome: Progressing Goal: Cardiovascular complication will be avoided Outcome: Progressing   Problem: Activity: Goal: Risk for activity intolerance will decrease Outcome: Progressing   Problem: Nutrition: Goal: Adequate nutrition will be maintained Outcome: Progressing   Problem: Coping: Goal: Level of anxiety will decrease Outcome: Progressing   Problem: Elimination: Goal: Will not experience complications related to bowel motility Outcome: Progressing Goal: Will not experience complications related to urinary retention Outcome: Progressing   Problem: Pain Managment: Goal: General experience of comfort will improve Outcome: Progressing   Problem: Safety: Goal: Ability to remain free from injury will improve Outcome: Progressing   Problem: Skin Integrity: Goal: Risk for impaired skin integrity will decrease Outcome: Progressing   Problem: Education: Goal: Knowledge of disease or condition will improve Outcome: Progressing Goal: Understanding of medication regimen will improve Outcome: Progressing Goal: Individualized Educational Video(s) Outcome: Progressing   Problem: Activity: Goal: Ability to tolerate increased activity will improve Outcome: Progressing   Problem: Cardiac: Goal: Ability to achieve and maintain  adequate cardiopulmonary perfusion will improve Outcome: Progressing   Problem: Health Behavior/Discharge Planning: Goal: Ability to safely manage health-related needs after discharge will improve Outcome: Progressing   Problem: Education: Goal: Understanding of CV disease, CV risk reduction, and recovery process will improve Outcome: Progressing   Problem: Cardiovascular: Goal: Ability to achieve and maintain adequate cardiovascular perfusion will improve Outcome: Progressing Goal: Vascular access site(s) Level 0-1 will be maintained Outcome: Progressing   Problem: Education: Goal: Will demonstrate proper wound care and an understanding of methods to prevent future damage Outcome: Progressing Goal: Knowledge of disease or condition will improve Outcome: Progressing Goal: Knowledge of the prescribed therapeutic regimen will improve Outcome: Progressing Goal: Individualized Educational Video(s) Outcome: Progressing   Problem: Activity: Goal: Risk for activity intolerance will decrease Outcome: Progressing   Problem: Cardiac: Goal: Will achieve and/or maintain hemodynamic stability Outcome: Progressing   Problem: Clinical Measurements: Goal: Postoperative complications will be avoided or minimized Outcome: Progressing   Problem: Respiratory: Goal: Respiratory status will improve Outcome: Progressing   Problem: Skin Integrity: Goal: Wound healing without signs and symptoms of infection Outcome: Progressing Goal: Risk for impaired skin integrity will decrease Outcome: Progressing   Problem: Urinary Elimination: Goal: Ability to achieve and maintain adequate renal perfusion and functioning will improve Outcome: Progressing

## 2019-12-29 NOTE — Progress Notes (Signed)
PT Cancellation Note  Patient Details Name: Joseph Greer MRN: [id number] DOB: Jan 04, 1956   Cancelled Treatment:    Reason Eval/Treat Not Completed: Other (comment);Patient at procedure or test/unavailable. Pt on HD in AM and in PM had just walked with nursing. Will follow up in AM.    [place] 12/29/2019, 4:05 PM [place] Pager [phone] Office [phone]

## 2019-12-29 NOTE — Procedures (Signed)
I was present at this dialysis session. I have reviewed the session itself and made appropriate changes.   2K bath. 2L UF goal. Qb 400.  TOl well.   For TDC today, IR consulted.    UOP 250mL.  Delta labs not consistent with GFR recovery.  Cont HD on MWF basis.    Filed Weights   12/28/19 0500 12/29/19 0500 12/29/19 0700  Weight: 86.1 kg 86.4 kg 86.7 kg    Recent Labs  Lab 12/28/19 1600 12/28/19 1600 12/29/19 0240  NA 134*   < > 132*  K 5.1   < > 5.3*  CL 100   < > 97*  CO2 23   < > 22  GLUCOSE 143*   < > 104*  BUN 60*   < > 68*  CREATININE 5.01*   < > 5.60*  CALCIUM 7.1*   < > 7.4*  PHOS 7.1*  --   --    < > = values in this interval not displayed.    Recent Labs  Lab 12/27/19 0448 12/28/19 0226 12/29/19 0240  WBC 13.1* 16.5* 15.9*  HGB 8.8* 9.0* 8.6*  HCT 28.7* 29.0* 27.9*  MCV 84.7 83.6 84.3  PLT 158 186 195    Scheduled Meds: . amiodarone  200 mg Oral Daily  . apixaban  5 mg Oral BID  . Apremilast  1 tablet Oral Daily  . aspirin EC  81 mg Oral Daily  . atorvastatin  80 mg Oral Daily  . B-complex with vitamin C  1 tablet Per Tube Daily  . bisacodyl  10 mg Oral Daily   Or  . bisacodyl  10 mg Rectal Daily  . chlorhexidine gluconate (MEDLINE KIT)  15 mL Mouth Rinse BID  . Chlorhexidine Gluconate Cloth  6 each Topical Daily  . docusate sodium  200 mg Oral Daily  . heparin sodium (porcine)      . insulin aspart  0-24 Units Subcutaneous Q4H  . insulin detemir  5 Units Subcutaneous BID  . pantoprazole  40 mg Oral Daily  . sodium chloride flush  3 mL Intravenous Q12H  . tacrolimus  1 mg Oral BID  . venlafaxine XR  37.5 mg Oral Daily   Continuous Infusions: . sodium chloride Stopped (12/21/19 1602)  . sodium chloride    . sodium chloride    . lactated ringers Stopped (12/19/19 1448)  . lactated ringers Stopped (12/20/19 1152)   PRN Meds:.sodium chloride, sodium chloride, alteplase, Gerhardt's butt cream, heparin, HYDROmorphone, insulin aspart, lidocaine  (PF), lidocaine-prilocaine, LORazepam, melatonin, metoprolol tartrate, ondansetron (ZOFRAN) IV, pentafluoroprop-tetrafluoroeth, senna-docusate, sodium chloride, sodium chloride flush   Ryan Sanford  MD 12/29/2019, 8:38 AM   

## 2019-12-29 NOTE — Progress Notes (Signed)
Nutrition Follow-up  DOCUMENTATION CODES:   Non-severe (moderate) malnutrition in context of chronic illness  INTERVENTION:   Ensure Enlive po BID, each supplement provides 350 kcal and 20 grams of protein  Add Rena-Vite daily  D/C B-complex with C   NUTRITION DIAGNOSIS:   Moderate Malnutrition related to chronic illness as evidenced by mild fat depletion, mild muscle depletion.  Being addressed via diet advancement, supplements  GOAL:   Patient will meet greater than or equal to 90% of their needs  Progressing  MONITOR:   PO intake, Supplement acceptance, Labs, Weight trends  REASON FOR ASSESSMENT:   Rounds    ASSESSMENT:   64 yo male admitted with NSTEMI and found to have severe biventricular failure and 3V CAD requiring CABG x 5. PMH liver transplant in 2009 at [facility], CKD III, DM, HTN  7/16 CABG x 5 7/19 CRRT intiated, Cortrak place, TF initiated 7/21 Diet advanced past CL to solids 7/23 CRRT discontinued 7/24 TF held 7/25 Cortrak removed 7/26 TDC placed by IR  Pt receiving HD on MWF, 1st HD today. Oliguric  Appetite has improved, recorded po intake around 75% on average since 7/23. Appetite improving, still not great however per pt  Weight 84 kg post HD today; noted admit weight 84 kg. Utilizing 84 kg as EDW at present.  Weight up to 92 kg post CABG. Net negative 4 L  Labs: sodium 132 (L), potassium 5.3 Meds: ss novolog, novolog q 4, levemir   Diet Order:   Diet Order            Diet NPO time specified  Diet effective now           Diet Heart Room service appropriate? Yes; Fluid consistency: Thin; Fluid restriction: 1200 mL Fluid  Diet effective now                 EDUCATION NEEDS:   Not appropriate for education at this time  Skin:  Skin Assessment: Reviewed RN Assessment  Last BM:  7/24  Height:   Ht Readings from Last 1 Encounters:  12/11/19 5\' 10"  (1.778 m)    Weight:   Wt Readings from Last 1 Encounters:  12/29/19 84.3 kg     BMI:  Body mass index is 26.67 kg/m.  Estimated Nutritional Needs:   Kcal:  2100-2400 kcals  Protein:  110-130 g  Fluid:  >/= 2L   Kerman Passey MS, RDN, LDN, CNSC Registered Dietitian III Clinical Nutrition RD Pager and On-Call Pager Number Located in [place]

## 2019-12-29 NOTE — Progress Notes (Signed)
10 Days Post-Op Procedure(s) (LRB): CORONARY ARTERY BYPASS GRAFTING (CABG) TIMES  X 5,   USING BILATERAL MAMMARY ARTERY AND RIGHT LEG GREATER SAPHENOUS VEIN HARVESTED ENDOSCOPICALLY (N/A) TRANSESOPHAGEAL ECHOCARDIOGRAM (TEE) (N/A) INDOCYANINE GREEN FLUORESCENCE IMAGING (ICG) (N/A) MAZE (N/A) CLIPPING OF ATRIAL APPENDAGE USING ATRICURE CLIP SIZE 50MM (N/A) Subjective: No complaints  Objective: Vital signs in last 24 hours: Temp:  [97.9 F (36.6 C)-98.2 F (36.8 C)] 98 F (36.7 C) (07/26 0752) Pulse Rate:  [72-84] 84 (07/26 0815) Cardiac Rhythm: Normal sinus rhythm (07/26 0400) Resp:  [12-27] 24 (07/26 0700) BP: (96-122)/(56-69) 118/68 (07/26 0815) SpO2:  [91 %-97 %] 95 % (07/26 0700) Weight:  [86.4 kg-86.7 kg] 86.7 kg (07/26 0700)  Hemodynamic parameters for last 24 hours: CVP:  [13 mmHg-17 mmHg] 17 mmHg  Intake/Output from previous day: 07/25 0701 - 07/26 0700 In: 480 [P.O.:480] Out: 250 [Urine:250] Intake/Output this shift: No intake/output data recorded.  General appearance: alert and cooperative Neurologic: intact Heart: regular rate and rhythm, S1, S2 normal, no murmur, click, rub or gallop Lungs: clear to auscultation bilaterally Abdomen: soft, non-tender; bowel sounds normal; no masses,  no organomegaly Extremities: extremities normal, atraumatic, no cyanosis or edema Wound: c/d/i  Lab Results: Recent Labs    12/28/19 0226 12/29/19 0240  WBC 16.5* 15.9*  HGB 9.0* 8.6*  HCT 29.0* 27.9*  PLT 186 195   BMET:  Recent Labs    12/28/19 1600 12/29/19 0240  NA 134* 132*  K 5.1 5.3*  CL 100 97*  CO2 23 22  GLUCOSE 143* 104*  BUN 60* 68*  CREATININE 5.01* 5.60*  CALCIUM 7.1* 7.4*    PT/INR:  Recent Labs    12/29/19 0629  LABPROT 16.2*  INR 1.4*   ABG    Component Value Date/Time   PHART 7.241 (L) 12/22/2019 1411   HCO3 18.3 (L) 12/22/2019 1411   TCO2 20 (L) 12/22/2019 1411   ACIDBASEDEF 8.0 (H) 12/22/2019 1411   O2SAT 55.8 12/29/2019 0235    CBG (last 3)  Recent Labs    12/28/19 2336 12/29/19 0325 12/29/19 0751  GLUCAP 100* 105* 108*    Assessment/Plan: S/P Procedure(s) (LRB): CORONARY ARTERY BYPASS GRAFTING (CABG) TIMES  X 5,   USING BILATERAL MAMMARY ARTERY AND RIGHT LEG GREATER SAPHENOUS VEIN HARVESTED ENDOSCOPICALLY (N/A) TRANSESOPHAGEAL ECHOCARDIOGRAM (TEE) (N/A) INDOCYANINE GREEN FLUORESCENCE IMAGING (ICG) (N/A) MAZE (N/A) CLIPPING OF ATRIAL APPENDAGE USING ATRICURE CLIP SIZE 50MM (N/A) Mobilize trial conventional HD today  If tolerated, txfer to step-down/tele   LOS: 17 days    Wonda Olds 12/29/2019

## 2019-12-30 ENCOUNTER — Inpatient Hospital Stay (HOSPITAL_COMMUNITY): Payer: Medicare HMO

## 2019-12-30 HISTORY — PX: IR US GUIDE VASC ACCESS RIGHT: IMG2390

## 2019-12-30 HISTORY — PX: IR FLUORO GUIDE CV LINE RIGHT: IMG2283

## 2019-12-30 LAB — GLUCOSE, CAPILLARY
Glucose-Capillary: 106 mg/dL — ABNORMAL HIGH (ref 70–99)
Glucose-Capillary: 107 mg/dL — ABNORMAL HIGH (ref 70–99)
Glucose-Capillary: 126 mg/dL — ABNORMAL HIGH (ref 70–99)
Glucose-Capillary: 138 mg/dL — ABNORMAL HIGH (ref 70–99)
Glucose-Capillary: 143 mg/dL — ABNORMAL HIGH (ref 70–99)

## 2019-12-30 LAB — BASIC METABOLIC PANEL
Anion gap: 9 (ref 5–15)
BUN: 38 mg/dL — ABNORMAL HIGH (ref 8–23)
CO2: 27 mmol/L (ref 22–32)
Calcium: 7.7 mg/dL — ABNORMAL LOW (ref 8.9–10.3)
Chloride: 98 mmol/L (ref 98–111)
Creatinine, Ser: 4.56 mg/dL — ABNORMAL HIGH (ref 0.61–1.24)
GFR calc Af Amer: 15 mL/min — ABNORMAL LOW (ref >60)
GFR calc non Af Amer: 13 mL/min — ABNORMAL LOW (ref >60)
Glucose, Bld: 107 mg/dL — ABNORMAL HIGH (ref 70–99)
Potassium: 5 mmol/L (ref 3.5–5.1)
Sodium: 134 mmol/L — ABNORMAL LOW (ref 135–145)

## 2019-12-30 LAB — CBC
HCT: 27.5 % — ABNORMAL LOW (ref 39.0–52.0)
Hemoglobin: 8.6 g/dL — ABNORMAL LOW (ref 13.0–17.0)
MCH: 26.5 pg (ref 26.0–34.0)
MCHC: 31.3 g/dL (ref 30.0–36.0)
MCV: 84.9 fL (ref 80.0–100.0)
Platelets: 230 10*3/uL (ref 150–400)
RBC: 3.24 MIL/uL — ABNORMAL LOW (ref 4.22–5.81)
RDW: 16.3 % — ABNORMAL HIGH (ref 11.5–15.5)
WBC: 11.2 10*3/uL — ABNORMAL HIGH (ref 4.0–10.5)
nRBC: 0 % (ref 0.0–0.2)

## 2019-12-30 LAB — COOXEMETRY PANEL
Carboxyhemoglobin: 1.4 % (ref 0.5–1.5)
Methemoglobin: 0.5 % (ref 0.0–1.5)
O2 Saturation: 48.9 %
Total hemoglobin: 9.7 g/dL — ABNORMAL LOW (ref 12.0–16.0)

## 2019-12-30 MED ORDER — HEPARIN SODIUM (PORCINE) 1000 UNIT/ML IJ SOLN
INTRAMUSCULAR | Status: AC | PRN
  Administered 2019-12-30: 3800 [IU] via INTRAVENOUS

## 2019-12-30 MED ORDER — LIDOCAINE HCL 1 % IJ SOLN
INTRAMUSCULAR | Status: AC
  Filled 2019-12-30: qty 20

## 2019-12-30 MED ORDER — MIDAZOLAM HCL 2 MG/2ML IJ SOLN
INTRAMUSCULAR | Status: AC | PRN
  Administered 2019-12-30: 1 mg via INTRAVENOUS

## 2019-12-30 MED ORDER — FENTANYL CITRATE (PF) 100 MCG/2ML IJ SOLN
INTRAMUSCULAR | Status: AC | PRN
  Administered 2019-12-30: 50 ug via INTRAVENOUS

## 2019-12-30 MED ORDER — HEPARIN SODIUM (PORCINE) 1000 UNIT/ML IJ SOLN
INTRAMUSCULAR | Status: AC
  Filled 2019-12-30: qty 1

## 2019-12-30 MED ORDER — LIDOCAINE HCL (PF) 1 % IJ SOLN
INTRAMUSCULAR | Status: AC | PRN
  Administered 2019-12-30: 10 mL

## 2019-12-30 MED ORDER — FENTANYL CITRATE (PF) 100 MCG/2ML IJ SOLN
INTRAMUSCULAR | Status: AC
  Filled 2019-12-30: qty 2

## 2019-12-30 MED ORDER — DARBEPOETIN ALFA 60 MCG/0.3ML IJ SOSY
60.0000 ug | PREFILLED_SYRINGE | INTRAMUSCULAR | Status: DC
  Filled 2019-12-30: qty 0.3

## 2019-12-30 MED ORDER — MIDAZOLAM HCL 2 MG/2ML IJ SOLN
INTRAMUSCULAR | Status: AC
  Filled 2019-12-30: qty 2

## 2019-12-30 NOTE — Progress Notes (Signed)
Inpatient Rehabilitation Admissions Coordinator  Patient has progressed well with therapy and CIR admit no longer recommended.   Danne Baxter, RN, MSN Rehab Admissions Coordinator [phone] 12/30/2019 2:41 PM

## 2019-12-30 NOTE — Progress Notes (Signed)
Occupational Therapy Treatment Patient Details Name: Joseph Greer MRN: [id number] DOB: 07/04/1955 Today's Date: 12/30/2019    History of present illness Pt is a 64 y.o. male admitted 12/11/19 for NSTEMI; found to have severe biventricular failure and multivessel CAD. S/p CABG x5, TEE, maze for afib on 7/16. Post operative course complicated by persistent LV dysfunction and AKI. CRRT initiated 7/19. Intermittent HD initiated 7/26. PMH includes CKD III, DM2, HTN, s/p liver transplant (2009), Hep C, anxiety, depression.   OT comments  Pt presents seated in recliner pleasant and willing to participate in therapy session. Pt performing room level mobility tasks using rollator at supervision level. Pt performing multiple ADL tasks while seated at sink with setup/supervision throughout. Discussed compensatory techniques for completing ADL tasks while reinforcing sternal precautions as well as activity progression and energy conservation strategies during recovery process. Pt with good understanding of sternal precautions. Given steady progress have updated d/c recommendations to [place]. Will continue to follow to progress pt towards established OT goals.    Follow Up Recommendations  Home health OT    Equipment Recommendations  3 in 1 bedside commode          Precautions / Restrictions Precautions Precautions: Sternal;Fall Precaution Booklet Issued: No Precaution Comments: verbally reviewed, pt with good recall        Mobility Bed Mobility               General bed mobility comments: OOB in recliner   Transfers Overall transfer level: Needs assistance Equipment used: 4-wheeled walker Transfers: Sit to/from Stand Sit to Stand: Supervision         General transfer comment: supervision for lines    Balance Overall balance assessment: Needs assistance Sitting-balance support: No upper extremity supported;Feet supported Sitting balance-Leahy Scale: Good     Standing  balance support: No upper extremity supported Standing balance-Leahy Scale: Fair                             ADL either performed or assessed with clinical judgement   ADL Overall ADL's : Needs assistance/impaired     Grooming: Set up;Sitting;Wash/dry face;Oral care;Brushing hair Grooming Details (indicate cue type and reason): seated on rollator at sink per pt request, discussed progressing to alternating between sitting/standing to increase endurance/activity tolerance                             Functional mobility during ADLs: Supervision/safety;Rolling walker (rollator)       Vision       Perception     Praxis      Cognition Arousal/Alertness: Awake/alert Behavior During Therapy: WFL for tasks assessed/performed Overall Cognitive Status: Within Functional Limits for tasks assessed                                          Exercises     Shoulder Instructions       General Comments VSS on RA    Pertinent Vitals/ Pain       Pain Assessment: Faces Faces Pain Scale: Hurts a little bit Pain Location: Sternal incision Pain Descriptors / Indicators: Guarding;Discomfort Pain Intervention(s): Monitored during session;Repositioned  Home Living  Prior Functioning/Environment              Frequency  Min 2X/week        Progress Toward Goals  OT Goals(current goals can now be found in the care plan section)  Progress towards OT goals: Progressing toward goals  Acute Rehab OT Goals Patient Stated Goal: Get back to work and exercising OT Goal Formulation: With patient Time For Goal Achievement: 01/09/20 Potential to Achieve Goals: Good ADL Goals Pt Will Perform Upper Body Bathing: with supervision;sitting Pt Will Perform Upper Body Dressing: with supervision;sitting Pt Will Perform Lower Body Dressing: with supervision;sit to/from stand Pt Will Transfer to  Toilet: with supervision;ambulating Pt Will Perform Toileting - Clothing Manipulation and hygiene: with supervision;sitting/lateral leans;sit to/from stand Additional ADL Goal #1: Pt will independently verbalize understanding of 3 sternal precautions  Plan Discharge plan needs to be updated    Co-evaluation                 AM-PAC OT "6 Clicks" Daily Activity     Outcome Measure   Help from another person eating meals?: None Help from another person taking care of personal grooming?: A Little Help from another person toileting, which includes using toliet, bedpan, or urinal?: A Lot Help from another person bathing (including washing, rinsing, drying)?: A Lot Help from another person to put on and taking off regular upper body clothing?: A Little Help from another person to put on and taking off regular lower body clothing?: A Lot 6 Click Score: 16    End of Session    OT Visit Diagnosis: Unsteadiness on feet (R26.81);Muscle weakness (generalized) (M62.81);Other symptoms and signs involving cognitive function   Activity Tolerance Patient tolerated treatment well   Patient Left in chair;with call bell/phone within reach   Nurse Communication Mobility status        Time: 1683-7290 OT Time Calculation (min): 25 min  Charges: OT General Charges $OT Visit: 1 Visit OT Treatments $Self Care/Home Management : 23-37 mins  Lou Cal, OT Acute Rehabilitation Services Pager [phone] Office [phone]    Raymondo Band 12/30/2019, 4:48 PM

## 2019-12-30 NOTE — Progress Notes (Signed)
Admit: 12/11/2019 LOS: 18  34M dialysis dependent AKI following CABG/MAZE/LAA CLip 7/16; hx/o liver transplant on tacrolimus  Subjective:  . Tolerated first intermittent HD treatment yesterday, 2 L UF . IR placed tunneled right IJ HD catheter earlier today, still has left IJ temp cath . Patient feels well, no complaints  07/26 0701 - 07/27 0700 In: 120 [P.O.:120] Out: 2200 [Urine:200]  Filed Weights   12/29/19 0700 12/29/19 1055 12/30/19 0300  Weight: 86.7 kg 84.3 kg 84.1 kg    Scheduled Meds: . amiodarone  200 mg Oral Daily  . apixaban  5 mg Oral BID  . Apremilast  1 tablet Oral Daily  . aspirin EC  81 mg Oral Daily  . atorvastatin  80 mg Oral Daily  . bisacodyl  10 mg Oral Daily   Or  . bisacodyl  10 mg Rectal Daily  . chlorhexidine gluconate (MEDLINE KIT)  15 mL Mouth Rinse BID  . Chlorhexidine Gluconate Cloth  6 each Topical Daily  . docusate sodium  200 mg Oral Daily  . feeding supplement (ENSURE ENLIVE)  237 mL Oral BID BM  . fentaNYL      . heparin sodium (porcine)      . insulin aspart  0-24 Units Subcutaneous Q4H  . insulin detemir  5 Units Subcutaneous BID  . lidocaine      . midazolam      . multivitamin  1 tablet Oral QHS  . pantoprazole  40 mg Oral Daily  . sodium chloride flush  3 mL Intravenous Q12H  . tacrolimus  1 mg Oral BID  . venlafaxine XR  37.5 mg Oral Daily   Continuous Infusions: . sodium chloride Stopped (12/21/19 1602)  . sodium chloride    . sodium chloride     PRN Meds:.sodium chloride, sodium chloride, alteplase, Gerhardt's butt cream, heparin, HYDROmorphone, insulin aspart, lidocaine (PF), lidocaine-prilocaine, LORazepam, melatonin, metoprolol tartrate, ondansetron (ZOFRAN) IV, pentafluoroprop-tetrafluoroeth, senna-docusate, sodium chloride, sodium chloride flush, traMADol  Current Labs: reviewed    Physical Exam:  Blood pressure 119/65, pulse 75, temperature 98.1 F (36.7 C), temperature source Oral, resp. rate 22, height _0   (1.778 m), weight 84.1 kg, SpO2 98 %. NAD External incision clean/dry/intact Regular, normal S1 and S2 Clear bilaterally Right IJ tunneled HD catheter C/D/I, still has left IJ temp cath No significant peripheral edema Soft, nontender  A 1. Dialysis dependent AKI, baseline CKD 3 with creatinine 1.2-1.5 2. Status post CABG/maze/left atrial appendage clipping on 7/16 3. Acute HFrEF, improved 4. Status post liver transplant, followed by [facility], on tacrolimus 5. Atrial fibrillation, on amiodarone and apixaban 6. DM2 7. Hyperkalemia, mild, stable 8. Anemia, hemoglobin 8s 9. CKD-BMD, trend P on HD, no meds at this time  P . Continue HD on MWF schedule, can come to HD unit tomorrow: 2K, 4h, 400/800, 2-3L UF, No heparin, TDC . Start CLIP as AKI . No permanent vascular access for now, moderate chance of GFR recovery . Check Fe levels, start ESA . Medication Issues; o Preferred narcotic agents for pain control are hydromorphone, fentanyl, and methadone. Morphine should not be used.  o Baclofen should be avoided o Avoid oral sodium phosphate and magnesium citrate based laxatives / bowel preps    Pearson Grippe MD 12/30/2019, 12:47 PM  Recent Labs  Lab 12/27/19 1600 12/27/19 2000 12/28/19 0226 12/28/19 1600 12/29/19 0240 12/30/19 0456  NA 134*  --    < > 134* 132* 134*  K 4.7  --    < >  5.1 5.3* 5.0  CL 97*  --    < > 100 97* 98  CO2 24  --    < > _0 GLUCOSE 173*  --    < > 143* 104* 107*  BUN 46*  --    < > 60* 68* 38*  CREATININE 3.70*  --    < > 5.01* 5.60* 4.56*  CALCIUM 7.2*  --    < > 7.1* 7.4* 7.7*  PHOS 7.5* 6.0*  --  7.1*  --   --    < > = values in this interval not displayed.   Recent Labs  Lab 12/28/19 0226 12/29/19 0240 12/30/19 0456  WBC 16.5* 15.9* 11.2*  HGB 9.0* 8.6* 8.6*  HCT 29.0* 27.9* 27.5*  MCV 83.6 84.3 84.9  PLT 186 195 230

## 2019-12-30 NOTE — Progress Notes (Signed)
Physical Therapy Treatment Patient Details Name: Joseph Greer MRN: [id number] DOB: [birth year] Today's Date: 12/30/2019    History of Present Illness Pt is a 64 y.o. male admitted 12/11/19 for NSTEMI; found to have severe biventricular failure and multivessel CAD. S/p CABG x5, TEE, maze for afib on 7/16. Post operative course complicated by persistent LV dysfunction and AKI. CRRT initiated 7/19. Intermittent HD initiated 7/26. PMH includes CKD III, DM2, HTN, s/p liver transplant (2009), Hep C, anxiety, depression.    PT Comments    Pt with significant improvement in mobility. Feel he will be able to return home at this point from a PT standpoint. His son works from home and will be available if needed after dc. Pt has rollator at home already.   Follow Up Recommendations  Home health PT;Supervision - Intermittent     Equipment Recommendations  None recommended by PT (To be determined)    Recommendations for Other Services       Precautions / Restrictions Precautions Precautions: Sternal;Fall    Mobility  Bed Mobility Overal bed mobility: Modified Independent Bed Mobility: Supine to Sit     Supine to sit: Modified independent (Device/Increase time);HOB elevated     General bed mobility comments: follows sternal precautions  Transfers Overall transfer level: Needs assistance Equipment used: 4-wheeled walker Transfers: Sit to/from Stand Sit to Stand: Supervision         General transfer comment: supervision for lines  Ambulation/Gait Ambulation/Gait assistance: Supervision Gait Distance (Feet): 375 Feet Assistive device: 4-wheeled walker Gait Pattern/deviations: Step-through pattern;Decreased stride length Gait velocity: decr Gait velocity interpretation: <1.31 ft/sec, indicative of household ambulator General Gait Details: supervision for safety and lines   Stairs             Wheelchair Mobility    Modified Rankin (Stroke Patients Only)        Balance Overall balance assessment: Needs assistance Sitting-balance support: No upper extremity supported;Feet supported Sitting balance-Leahy Scale: Good     Standing balance support: No upper extremity supported Standing balance-Leahy Scale: Fair                              Cognition Arousal/Alertness: Awake/alert Behavior During Therapy: WFL for tasks assessed/performed Overall Cognitive Status: Within Functional Limits for tasks assessed                                        Exercises      General Comments General comments (skin integrity, edema, etc.): VSS on RA      Pertinent Vitals/Pain Pain Assessment: No/denies pain    Home Living                      Prior Function            PT Goals (current goals can now be found in the care plan section) Acute Rehab PT Goals Patient Stated Goal: Get back to work and exercising Progress towards PT goals: Progressing toward goals    Frequency    Min 3X/week      PT Plan Discharge plan needs to be updated    Co-evaluation              AM-PAC PT "6 Clicks" Mobility   Outcome Measure  Help needed turning from your back to your side while in a flat  bed without using bedrails?: None Help needed moving from lying on your back to sitting on the side of a flat bed without using bedrails?: None Help needed moving to and from a bed to a chair (including a wheelchair)?: A Little Help needed standing up from a chair using your arms (e.g., wheelchair or bedside chair)?: None Help needed to walk in hospital room?: A Little Help needed climbing 3-5 steps with a railing? : A Little 6 Click Score: 21    End of Session   Activity Tolerance: Patient tolerated treatment well Patient left: in chair;with call bell/phone within reach Nurse Communication: Mobility status PT Visit Diagnosis: Other abnormalities of gait and mobility (R26.89);Difficulty in walking, not elsewhere  classified (R26.2)     Time: 9169-4503 PT Time Calculation (min) (ACUTE ONLY): 17 min  Charges:  $Gait Training: 8-22 mins                     [place] Pager [phone] Office [place] 12/30/2019, 11:24 AM

## 2019-12-30 NOTE — Progress Notes (Signed)
Left IJ temporary hemodialysis catheter removed per protocol d/t permanent tunneled line placement.  Manual pressure applied for 10 mins. No bleeding or swelling noted. Instructed patient to remain in bed until 1300. Educated patient about S/S of infection and when to call MD; no heavy lifting or pressure on right side for 24 hours; keep dressing dry and intact until 1230 tomorrow. Pt verbalized comprehension.

## 2019-12-30 NOTE — Progress Notes (Signed)
Patient ID: Joseph Greer, male   DOB: 27-Sep-1955, 64 y.o.   MRN: [id number]     Advanced Heart Failure Rounding Note  PCP-Cardiologist: Candee Furbish, MD  AHF:  Dr. [name]   Patient Profile   64 y/o male w/ h/o Hep C s/p liver transplant in 2009 (followed at [facility]), Stage III CKD, HTN and T2DM admitted w/ NSTEMI and Afib w/ RVR, found to have severe 3VD and biventricular heart failure and ? PFO, LVEF 20-25%, RV moderately reduced. S/p CABG + MAZE + LA appendage clipping 7/16.    Subjective:    - 7/16: CABG x 5 with LIMA-LAD, RIMA-ramus, seq SVG-PD/PL, SVG-D; Maze; LA appendage clipping. - 7/18 Limited echo: LVEF 35-40%. RV normal. No pericardial effusion.  - Post operative course c/b AKI and oliguria. CRRT initiated 7/19.  - 7/19 Milrinone discontinued>>changed to dobutamine. Developed hypotension w/ increase in pressor support. NE and VP added. Empiric abx added for ?HCAP.  Became increasingly tachycardic and dobutamine stopped overnight.  - CRRT stopped 7/23>>iHD MWF  CO-OX 49%, CVP 8-9.  Had HD yesterday, weight down 5 lbs.  200 cc UOP.  No chest pain.    Objective:   Weight Range: 84.1 kg Body mass index is 26.6 kg/m.   Vital Signs:   Temp:  [97.5 F (36.4 C)-98.4 F (36.9 C)] 97.9 F (36.6 C) (07/27 0300) Pulse Rate:  [69-91] 69 (07/27 0300) Resp:  [15-35] 15 (07/27 0300) BP: (97-122)/(54-75) 112/63 (07/27 0300) SpO2:  [93 %-99 %] 97 % (07/27 0300) Weight:  [84.1 kg-84.3 kg] 84.1 kg (07/27 0300) Last BM Date: 12/28/19  Weight change: Filed Weights   12/29/19 0700 12/29/19 1055 12/30/19 0300  Weight: 86.7 kg 84.3 kg 84.1 kg    Intake/Output:   Intake/Output Summary (Last 24 hours) at 12/30/2019 0722 Last data filed at 12/29/2019 1530 Gross per 24 hour  Intake 120 ml  Output 2200 ml  Net -2080 ml      Physical Exam   General:  Well appearing. No resp difficulty HEENT: normal Neck: supple. JVP 8 cm. Carotids 2+ bilat; no bruits. No lymphadenopathy or  thryomegaly appreciated. LIJ HD catheter Cor: PMI nondisplaced. Regular rate & rhythm. No rubs, gallops or murmurs. Lungs: clear Abdomen: soft, nontender, nondistended. No hepatosplenomegaly. No bruits or masses. Good bowel sounds. Extremities: no cyanosis, clubbing, rash, edema Neuro: alert & orientedx3, cranial nerves grossly intact. moves all 4 extremities w/o difficulty. Affect pleasant  Telemetry   NSR 70s (personally reviewed)    Labs    CBC Recent Labs    12/29/19 0240 12/30/19 0456  WBC 15.9* 11.2*  HGB 8.6* 8.6*  HCT 27.9* 27.5*  MCV 84.3 84.9  PLT 195 578   Basic Metabolic Panel Recent Labs    12/27/19 1600 12/27/19 2000 12/28/19 0226 12/28/19 0226 12/28/19 1600 12/28/19 1600 12/29/19 0240 12/30/19 0456  NA   < >  --  133*   < > 134*   < > 132* 134*  K   < >  --  4.7   < > 5.1   < > 5.3* 5.0  CL   < >  --  98   < > 100   < > 97* 98  CO2   < >  --  23   < > 23   < > 22 27  GLUCOSE   < >  --  109*   < > 143*   < > 104* 107*  BUN   < >  --  52*   < > 60*   < > 68* 38*  CREATININE   < >  --  4.37*   < > 5.01*   < > 5.60* 4.56*  CALCIUM   < >  --  7.3*   < > 7.1*   < > 7.4* 7.7*  MG  --   --  2.5*  --   --   --  2.4  --   PHOS  --  6.0*  --   --  7.1*  --   --   --    < > = values in this interval not displayed.   Liver Function Tests Recent Labs    12/28/19 0226 12/28/19 0226 12/28/19 1600 12/29/19 0240  AST 71*  --   --  37  ALT 100*  --   --  69*  ALKPHOS 278*  --   --  231*  BILITOT 0.7  --   --  0.8  PROT 5.9*  --   --  5.7*  ALBUMIN 2.4*   < > 2.3* 2.3*   < > = values in this interval not displayed.   No results for input(s): LIPASE, AMYLASE in the last 72 hours. Cardiac Enzymes No results for input(s): CKTOTAL, CKMB, CKMBINDEX, TROPONINI in the last 72 hours.  BNP: BNP (last 3 results) No results for input(s): BNP in the last 8760 hours.  ProBNP (last 3 results) No results for input(s): PROBNP in the last 8760 hours.   D-Dimer No  results for input(s): DDIMER in the last 72 hours. Hemoglobin A1C No results for input(s): HGBA1C in the last 72 hours. Fasting Lipid Panel No results for input(s): CHOL, HDL, LDLCALC, TRIG, CHOLHDL, LDLDIRECT in the last 72 hours. Thyroid Function Tests No results for input(s): TSH, T4TOTAL, T3FREE, THYROIDAB in the last 72 hours.  Invalid input(s): FREET3  Other results:   Imaging    No results found.   Medications:     Scheduled Medications:  amiodarone  200 mg Oral Daily   apixaban  5 mg Oral BID   Apremilast  1 tablet Oral Daily   aspirin EC  81 mg Oral Daily   atorvastatin  80 mg Oral Daily   bisacodyl  10 mg Oral Daily   Or   bisacodyl  10 mg Rectal Daily   chlorhexidine gluconate (MEDLINE KIT)  15 mL Mouth Rinse BID   Chlorhexidine Gluconate Cloth  6 each Topical Daily   docusate sodium  200 mg Oral Daily   feeding supplement (ENSURE ENLIVE)  237 mL Oral BID BM   insulin aspart  0-24 Units Subcutaneous Q4H   insulin detemir  5 Units Subcutaneous BID   multivitamin  1 tablet Oral QHS   pantoprazole  40 mg Oral Daily   sodium chloride flush  3 mL Intravenous Q12H   tacrolimus  1 mg Oral BID   venlafaxine XR  37.5 mg Oral Daily    Infusions:  sodium chloride Stopped (12/21/19 1602)   sodium chloride     sodium chloride      PRN Medications: sodium chloride, sodium chloride, alteplase, Gerhardt's butt cream, heparin, HYDROmorphone, insulin aspart, lidocaine (PF), lidocaine-prilocaine, LORazepam, melatonin, metoprolol tartrate, ondansetron (ZOFRAN) IV, pentafluoroprop-tetrafluoroeth, senna-docusate, sodium chloride, sodium chloride flush, traMADol    Assessment/Plan   1. CAD: Severe 3VD on cath 7/21 => 60-70% ostial LAD, 80% mLAD, 95% ramus, 99% proximal LCx with TIMI-2 flow down the rest of the LCx, occluded distal RCA.  Suspect out  of hospital inferolateral MI.  Cardiac MRI showed that the inferolateral wall is likely not viable, but  the remainder of the LV myocardium is likely viable. He had CABG 7/16 with LIMA-LAD, RIMA-ramus, seq SVG-PD/PL, SVG-D. No chest pain.  - Continue ASA + atorvastatin.   - Mobilize.  2. Acute on chronic systolic CHF: Echo with EF 20-25%, moderately decreased RV systolic function. Cardiac MRI with LV EF 26%, RV EF 38%, near full thickness scar in inferolateral wall suggesting lack of viability in the LCx territory.  Now post-CABG. Repeat limited echo 7/20 showed LVEF 20-25%, RV normal, no pericardial effusion. Remains oliguric. CVVHD stopped 7/23. Now on iHD, MWF for volume control (5lb above dry wt today). Off inotropes/ pressors. Milrinone stopped 7/25. Co-ox 49%, CVP 8-9.    - Volume management per Renal.  iHD MWF - Now off midodrine.  - no ARB/ARNI, dig nor spiro w/ renal failure 3. Atrial fibrillation: Patient admitted with afib/RVR. Remains in NSR. S/p Maze + LAA clipping on 7/16.  - Now on amio 200 po daily. - on Eliquis now. hgb stable at 8.6.  4. AKI on CKD stage 3: Creatine has been stable 1.9-2.3 range in hospital pre-op.  Was 1.3 back in 4/21. Creatinine rose to 6.46 post-op. Suspect post-op ATN. CRRT initiated 7/19. Remains oliguric. CVVHD stopped 7/23, now tolerating iHD.  UOP about 200 cc yesterday.  - Management per Renal.  5. Type 2 diabetes: SSI.  6. Liver transplant for HCV: Tacrolimus trough 3 on 7/24, continue current dose. Now off stress dose steroids (not on steroids at home prior).  7. ID: 7/19 developed hypotension w/ increasing pressor support, increasing leukocytosis and CXR w/ hazy opacities. Started on empiric abx for ? HCAP with vancomycin/cefepime => now off. BC NGTD. WBCs 11.  - no change  8. Hyperkalemia: K down to 5 today.  - HD per nephrology   Loralie Champagne 12/30/2019

## 2019-12-30 NOTE — Progress Notes (Signed)
PT Cancellation Note  Patient Details Name: Joseph Greer MRN: [id number] DOB: [birth year]   Cancelled Treatment:    Reason Eval/Treat Not Completed: Patient at procedure or test/unavailable. Pt getting HD cath. Will check back later today.   Shary Decamp [facility] 12/30/2019, 8:20 AM [place] Pager [phone] Office [phone]

## 2019-12-30 NOTE — Progress Notes (Signed)
11 Days Post-Op Procedure(s) (LRB): CORONARY ARTERY BYPASS GRAFTING (CABG) TIMES  X 5,   USING BILATERAL MAMMARY ARTERY AND RIGHT LEG GREATER SAPHENOUS VEIN HARVESTED ENDOSCOPICALLY (N/A) TRANSESOPHAGEAL ECHOCARDIOGRAM (TEE) (N/A) INDOCYANINE GREEN FLUORESCENCE IMAGING (ICG) (N/A) MAZE (N/A) CLIPPING OF ATRIAL APPENDAGE USING ATRICURE CLIP SIZE 50MM (N/A) Subjective: No complaints  Objective: Vital signs in last 24 hours: Temp:  [97.5 F (36.4 C)-98.4 F (36.9 C)] 97.9 F (36.6 C) (07/27 0300) Pulse Rate:  [69-91] 69 (07/27 0300) Cardiac Rhythm: Normal sinus rhythm (07/27 0700) Resp:  [15-35] 15 (07/27 0300) BP: (97-122)/(54-75) 112/63 (07/27 0300) SpO2:  [93 %-99 %] 97 % (07/27 0300) Weight:  [84.1 kg-84.3 kg] 84.1 kg (07/27 0300)  Hemodynamic parameters for last 24 hours: CVP:  [10 mmHg-15 mmHg] 10 mmHg  Intake/Output from previous day: 07/26 0701 - 07/27 0700 In: 120 [P.O.:120] Out: 2200 [Urine:200] Intake/Output this shift: No intake/output data recorded.  General appearance: alert and cooperative Neurologic: intact Heart: regular rate and rhythm, S1, S2 normal, no murmur, click, rub or gallop Lungs: clear to auscultation bilaterally Wound: c/d/i  Lab Results: Recent Labs    12/29/19 0240 12/30/19 0456  WBC 15.9* 11.2*  HGB 8.6* 8.6*  HCT 27.9* 27.5*  PLT 195 230   BMET:  Recent Labs    12/29/19 0240 12/30/19 0456  NA 132* 134*  K 5.3* 5.0  CL 97* 98  CO2 22 27  GLUCOSE 104* 107*  BUN 68* 38*  CREATININE 5.60* 4.56*  CALCIUM 7.4* 7.7*    PT/INR:  Recent Labs    12/29/19 0629  LABPROT 16.2*  INR 1.4*   ABG    Component Value Date/Time   PHART 7.241 (L) 12/22/2019 1411   HCO3 18.3 (L) 12/22/2019 1411   TCO2 20 (L) 12/22/2019 1411   ACIDBASEDEF 8.0 (H) 12/22/2019 1411   O2SAT 48.9 12/30/2019 0450   CBG (last 3)  Recent Labs    12/29/19 2021 12/30/19 0010 12/30/19 0440  GLUCAP 125* 138* 106*    Assessment/Plan: S/P Procedure(s)  (LRB): CORONARY ARTERY BYPASS GRAFTING (CABG) TIMES  X 5,   USING BILATERAL MAMMARY ARTERY AND RIGHT LEG GREATER SAPHENOUS VEIN HARVESTED ENDOSCOPICALLY (N/A) TRANSESOPHAGEAL ECHOCARDIOGRAM (TEE) (N/A) INDOCYANINE GREEN FLUORESCENCE IMAGING (ICG) (N/A) MAZE (N/A) CLIPPING OF ATRIAL APPENDAGE USING ATRICURE CLIP SIZE 50MM (N/A) tunneled HD cath today  CIR consult Doing much better   LOS: 18 days    [facility] 12/30/2019

## 2019-12-30 NOTE — Procedures (Signed)
  Procedure: R IJ tunneled HD catheter placement   EBL:   minimal Complications:  none immediate  See full dictation in [facility].  Dillard Cannon MD Main # [phone] Pager  [phone]

## 2019-12-30 NOTE — Progress Notes (Signed)
Medication Management  63 yoM admitted 7/8  from [facility] s/p 7 day hx SOB> cath 3v CAD EF 30% > CABG 7/16, pre/op/post op Afib started on amiodarone drip 7/11.  Patient has Hx liver transplant in [place] and is followed by [facility] liver transplant team.  PTA tacrolimus 2mg  BID with last outpatient level on 4/21 resulted as 3.1 at goal 2-3 per [facility]   Tacrolimus 2mg  BID continued inpatient with levels drawn after starting amiodarone. Initial TAC levels elevated (see below) and TAC was held with concern for toxicities (tremor/nausea) and then resumed at 1mg  BID 7/21. Of note, pt has had post/op renal failure requiring CRRT 7/19 to 7/23 and then transitioned to iHD on 7/26.  Labs: 7/14 TAC = 8.3 (drawn ~2h prior to true trough) 7/15 TAC = 8.9 (drawn as peak) 7/21 TAC = 6.6 >> dose reduced to 1mg  BID 7/24 TAC = 3.0  Plan: -TAC level is at goal -Results communicated to [facility] transplant office via voicemail (no answer) -Given changing RRT plans, will F/U [facility] transplant team recs, but will likely plan to recheck TAC level in a few days   Joseph Greer, PharmD, BCPS Clinical Pharmacist [phone] Please check AMION for all [facility] numbers 12/30/2019

## 2019-12-30 NOTE — Progress Notes (Signed)
Renal Navigator received notification from Dr. Sanford/Nephrologist that patient needs referral for OP HD treatment for AKI. Navigator met with patient at bedside to discuss. Patient pleasant and welcoming of visit. He is aware of OP HD treatment need and states desire to be set up for HD in [place], where he lives with his son. He reports that his son will provide transportation, or that he can ask other friends/family as needed. He requests MWF, if possible, since his son is off on Fridays. He understands that this will depend on seat availability at the clinic. Referral completed and submitted to [facility] to request treatment for AKI at [facility] in [place]. Navigator will follow closely.  Shaw, Colleen Elizabeth, LCSW Renal Navigator [phone] 

## 2019-12-30 NOTE — Progress Notes (Signed)
CARDIAC REHAB PHASE I   PRE:  Rate/Rhythm: 75 SR  BP:  Supine: 106/59  Sitting:   Standing:    SaO2: 97%RA  MODE:  Ambulation: 150 ft   POST:  Rate/Rhythm: 87 SR  BP:  Supine:   Sitting: 115/71  Standing:    SaO2: 99%RA 1338-1410 Pt has rollator at home so used one to walk. Pt walked 150 ft on RA with slow steady gait but tired quickly and c/o SOB.. Back to room to recliner with call bell. Sats good on RA.   Graylon Good, RN BSN  12/30/2019 2:05 PM

## 2019-12-31 LAB — GLUCOSE, CAPILLARY
Glucose-Capillary: 135 mg/dL — ABNORMAL HIGH (ref 70–99)
Glucose-Capillary: 163 mg/dL — ABNORMAL HIGH (ref 70–99)
Glucose-Capillary: 74 mg/dL (ref 70–99)
Glucose-Capillary: 88 mg/dL (ref 70–99)
Glucose-Capillary: 94 mg/dL (ref 70–99)
Glucose-Capillary: 98 mg/dL (ref 70–99)

## 2019-12-31 LAB — CBC
HCT: 26.7 % — ABNORMAL LOW (ref 39.0–52.0)
Hemoglobin: 8.2 g/dL — ABNORMAL LOW (ref 13.0–17.0)
MCH: 25.9 pg — ABNORMAL LOW (ref 26.0–34.0)
MCHC: 30.7 g/dL (ref 30.0–36.0)
MCV: 84.2 fL (ref 80.0–100.0)
Platelets: 249 10*3/uL (ref 150–400)
RBC: 3.17 MIL/uL — ABNORMAL LOW (ref 4.22–5.81)
RDW: 16.1 % — ABNORMAL HIGH (ref 11.5–15.5)
WBC: 10.3 10*3/uL (ref 4.0–10.5)
nRBC: 0 % (ref 0.0–0.2)

## 2019-12-31 LAB — COOXEMETRY PANEL
Carboxyhemoglobin: 1.7 % — ABNORMAL HIGH (ref 0.5–1.5)
Methemoglobin: 1.1 % (ref 0.0–1.5)
O2 Saturation: 53.6 %
Total hemoglobin: 9.6 g/dL — ABNORMAL LOW (ref 12.0–16.0)

## 2019-12-31 LAB — BASIC METABOLIC PANEL
Anion gap: 11 (ref 5–15)
BUN: 52 mg/dL — ABNORMAL HIGH (ref 8–23)
CO2: 25 mmol/L (ref 22–32)
Calcium: 7.5 mg/dL — ABNORMAL LOW (ref 8.9–10.3)
Chloride: 99 mmol/L (ref 98–111)
Creatinine, Ser: 5.39 mg/dL — ABNORMAL HIGH (ref 0.61–1.24)
GFR calc Af Amer: 12 mL/min — ABNORMAL LOW (ref >60)
GFR calc non Af Amer: 10 mL/min — ABNORMAL LOW (ref >60)
Glucose, Bld: 93 mg/dL (ref 70–99)
Potassium: 4.8 mmol/L (ref 3.5–5.1)
Sodium: 135 mmol/L (ref 135–145)

## 2019-12-31 LAB — TRANSFERRIN: Transferrin: 214 mg/dL (ref 180–329)

## 2019-12-31 LAB — FERRITIN: Ferritin: 108 ng/mL (ref 24–336)

## 2019-12-31 MED ORDER — HEPARIN SODIUM (PORCINE) 1000 UNIT/ML IJ SOLN
INTRAMUSCULAR | Status: AC
  Administered 2019-12-31: 1000 [IU]
  Filled 2019-12-31: qty 4

## 2019-12-31 MED ORDER — DARBEPOETIN ALFA 60 MCG/0.3ML IJ SOSY
PREFILLED_SYRINGE | INTRAMUSCULAR | Status: AC
  Administered 2019-12-31: 60 ug via INTRAVENOUS
  Filled 2019-12-31: qty 0.3

## 2019-12-31 MED ORDER — CARVEDILOL 3.125 MG PO TABS
3.1250 mg | ORAL_TABLET | Freq: Two times a day (BID) | ORAL | Status: DC
  Administered 2019-12-31 – 2020-01-01 (×2): 3.125 mg via ORAL
  Filled 2019-12-31 (×3): qty 1

## 2019-12-31 NOTE — Progress Notes (Signed)
   12/31/19 1600  Assess: MEWS Score  Temp 98.7 F (37.1 C)  BP (!) 84/51 (Simultaneous filing. User may not have seen previous data.)  Pulse Rate 79  ECG Heart Rate 79 (Simultaneous filing. User may not have seen previous data.)  Resp 22 (Simultaneous filing. User may not have seen previous data.)  Level of Consciousness Alert  SpO2 98 %  O2 Device Room Air  Patient Activity (if Appropriate) In bed  Assess: MEWS Score  MEWS Temp 0  MEWS Systolic 1  MEWS Pulse 0  MEWS RR 1  MEWS LOC 0  MEWS Score 2  MEWS Score Color Yellow  Assess: if the MEWS score is Yellow or Red  Were vital signs taken at a resting state? Yes  Focused Assessment No change from prior assessment  Early Detection of Sepsis Score *See Row Information* Low  MEWS guidelines implemented *See Row Information* No, vital signs rechecked  Document  Progress note created (see row info) Yes

## 2019-12-31 NOTE — Procedures (Signed)
I was present at this dialysis session. I have reviewed the session itself and made appropriate changes.   3L UF. Using new R IJ TDC, some blood oozing. 2K bath.  CLIP as AKI in process.    Hb stable in 8s.  On ESA, f/u Fe levels.   Filed Weights   12/30/19 0300 12/31/19 0647 12/31/19 0705  Weight: 84.1 kg 83.1 kg 83.1 kg    Recent Labs  Lab 12/28/19 1600 12/29/19 0240 12/31/19 0438  NA 134*   < > 135  K 5.1   < > 4.8  CL 100   < > 99  CO2 23   < > 25  GLUCOSE 143*   < > 93  BUN 60*   < > 52*  CREATININE 5.01*   < > 5.39*  CALCIUM 7.1*   < > 7.5*  PHOS 7.1*  --   --    < > = values in this interval not displayed.    Recent Labs  Lab 12/29/19 0240 12/30/19 0456 12/31/19 0438  WBC 15.9* 11.2* 10.3  HGB 8.6* 8.6* 8.2*  HCT 27.9* 27.5* 26.7*  MCV 84.3 84.9 84.2  PLT 195 230 249    Scheduled Meds: . amiodarone  200 mg Oral Daily  . apixaban  5 mg Oral BID  . Apremilast  1 tablet Oral Daily  . aspirin EC  81 mg Oral Daily  . atorvastatin  80 mg Oral Daily  . bisacodyl  10 mg Oral Daily   Or  . bisacodyl  10 mg Rectal Daily  . chlorhexidine gluconate (MEDLINE KIT)  15 mL Mouth Rinse BID  . Chlorhexidine Gluconate Cloth  6 each Topical Daily  . darbepoetin (ARANESP) injection - DIALYSIS  60 mcg Intravenous Q Wed-HD  . docusate sodium  200 mg Oral Daily  . feeding supplement (ENSURE ENLIVE)  237 mL Oral BID BM  . insulin aspart  0-24 Units Subcutaneous Q4H  . insulin detemir  5 Units Subcutaneous BID  . multivitamin  1 tablet Oral QHS  . pantoprazole  40 mg Oral Daily  . sodium chloride flush  3 mL Intravenous Q12H  . tacrolimus  1 mg Oral BID  . venlafaxine XR  37.5 mg Oral Daily   Continuous Infusions: . sodium chloride Stopped (12/21/19 1602)  . sodium chloride    . sodium chloride     PRN Meds:.sodium chloride, sodium chloride, alteplase, Gerhardt's butt cream, heparin, HYDROmorphone, insulin aspart, lidocaine (PF), lidocaine-prilocaine, LORazepam,  melatonin, metoprolol tartrate, ondansetron (ZOFRAN) IV, pentafluoroprop-tetrafluoroeth, senna-docusate, sodium chloride, sodium chloride flush, traMADol   Pearson Grippe  MD 12/31/2019, 8:30 AM

## 2019-12-31 NOTE — Plan of Care (Signed)
  Problem: Education: Goal: Knowledge of General Education information will improve Description: Including pain rating scale, medication(s)/side effects and non-pharmacologic comfort measures Outcome: Progressing   Problem: Health Behavior/Discharge Planning: Goal: Ability to manage health-related needs will improve Outcome: Progressing   Problem: Clinical Measurements: Goal: Ability to maintain clinical measurements within normal limits will improve Outcome: Progressing Goal: Will remain free from infection Outcome: Progressing Goal: Diagnostic test results will improve Outcome: Progressing Goal: Respiratory complications will improve Outcome: Progressing Goal: Cardiovascular complication will be avoided Outcome: Progressing   Problem: Activity: Goal: Risk for activity intolerance will decrease Outcome: Progressing   Problem: Nutrition: Goal: Adequate nutrition will be maintained Outcome: Progressing   Problem: Coping: Goal: Level of anxiety will decrease Outcome: Progressing   Problem: Elimination: Goal: Will not experience complications related to bowel motility Outcome: Progressing Goal: Will not experience complications related to urinary retention Outcome: Progressing   Problem: Pain Managment: Goal: General experience of comfort will improve Outcome: Progressing   Problem: Safety: Goal: Ability to remain free from injury will improve Outcome: Progressing   Problem: Skin Integrity: Goal: Risk for impaired skin integrity will decrease Outcome: Progressing   Problem: Education: Goal: Knowledge of disease or condition will improve Outcome: Progressing Goal: Understanding of medication regimen will improve Outcome: Progressing Goal: Individualized Educational Video(s) Outcome: Progressing   Problem: Activity: Goal: Ability to tolerate increased activity will improve Outcome: Progressing   Problem: Cardiac: Goal: Ability to achieve and maintain  adequate cardiopulmonary perfusion will improve Outcome: Progressing   Problem: Health Behavior/Discharge Planning: Goal: Ability to safely manage health-related needs after discharge will improve Outcome: Progressing   Problem: Education: Goal: Understanding of CV disease, CV risk reduction, and recovery process will improve Outcome: Progressing   Problem: Cardiovascular: Goal: Ability to achieve and maintain adequate cardiovascular perfusion will improve Outcome: Progressing Goal: Vascular access site(s) Level 0-1 will be maintained Outcome: Progressing   Problem: Education: Goal: Will demonstrate proper wound care and an understanding of methods to prevent future damage Outcome: Progressing Goal: Knowledge of disease or condition will improve Outcome: Progressing Goal: Knowledge of the prescribed therapeutic regimen will improve Outcome: Progressing Goal: Individualized Educational Video(s) Outcome: Progressing   Problem: Activity: Goal: Risk for activity intolerance will decrease Outcome: Progressing   Problem: Cardiac: Goal: Will achieve and/or maintain hemodynamic stability Outcome: Progressing   Problem: Clinical Measurements: Goal: Postoperative complications will be avoided or minimized Outcome: Progressing   Problem: Respiratory: Goal: Respiratory status will improve Outcome: Progressing   Problem: Skin Integrity: Goal: Wound healing without signs and symptoms of infection Outcome: Progressing Goal: Risk for impaired skin integrity will decrease Outcome: Progressing   Problem: Urinary Elimination: Goal: Ability to achieve and maintain adequate renal perfusion and functioning will improve Outcome: Progressing

## 2019-12-31 NOTE — Progress Notes (Addendum)
Patient ID: Joseph Greer, male   DOB: 1955/06/20, 64 y.o.   MRN: [id number]     Advanced Heart Failure Rounding Note  PCP-Cardiologist: Candee Furbish, MD  AHF:  Dr. [name]   Patient Profile   64 y/o male w/ h/o Hep C s/p liver transplant in 2009 (followed at [facility]), Stage III CKD, HTN and T2DM admitted w/ NSTEMI and Afib w/ RVR, found to have severe 3VD and biventricular heart failure and ? PFO, LVEF 20-25%, RV moderately reduced. S/p CABG + MAZE + LA appendage clipping 7/16.    Subjective:    - 7/16: CABG x 5 with LIMA-LAD, RIMA-ramus, seq SVG-PD/PL, SVG-D; Maze; LA appendage clipping. - 7/18 Limited echo: LVEF 35-40%. RV normal. No pericardial effusion.  - Post operative course c/b AKI and oliguria. CRRT initiated 7/19.  - 7/19 Milrinone discontinued>>changed to dobutamine. Developed hypotension w/ increase in pressor support. NE and VP added. Empiric abx added for ?HCAP.  Became increasingly tachycardic and dobutamine stopped overnight.  - CRRT stopped 7/23>>iHD MWF  Tunneled right IJ HD cath placed by IR yesterday.  Slight oozing from port site today. H/H stable.   Currently getting HD. BP tolerating ok. Looks well. Feeling better. No CP or dyspnea. In NSR. HR 80s. Remains oliguric only 100 cc in urine production yesterday.   Co-ox marginal at 54% but improved from yesterday (49%).     Objective:   Weight Range: 83.1 kg Body mass index is 26.29 kg/m.   Vital Signs:   Temp:  [97.9 F (36.6 C)-98.4 F (36.9 C)] 98.4 F (36.9 C) (07/28 0705) Pulse Rate:  [71-78] 76 (07/28 0800) Resp:  [14-32] 16 (07/28 0800) BP: (110-125)/(60-71) 125/68 (07/28 0800) SpO2:  [93 %-98 %] 95 % (07/28 0730) Weight:  [83.1 kg] 83.1 kg (07/28 0705) Last BM Date: 12/31/19  Weight change: Filed Weights   12/30/19 0300 12/31/19 0647 12/31/19 0705  Weight: 84.1 kg 83.1 kg 83.1 kg    Intake/Output:   Intake/Output Summary (Last 24 hours) at 12/31/2019 0838 Last data filed at 12/31/2019  0415 Gross per 24 hour  Intake 910 ml  Output 100 ml  Net 810 ml      Physical Exam   General:  Well appearing, sitting up in bed currently getting HD. No resp difficulty HEENT: normal Neck: supple. JVP 8 cm. Carotids 2+ bilat; no bruits. No lymphadenopathy or thryomegaly appreciated. Rt tunneled IJ HD catheter w/ slight oozing from port site Cor: PMI nondisplaced. Regular rate & rhythm. No rubs, gallops or murmurs. Lungs: clear. No wheezing  Abdomen: soft, nontender, nondistended. No hepatosplenomegaly. No bruits or masses. Good bowel sounds. Extremities: no cyanosis, clubbing, rash, edema Neuro: alert & orientedx3, cranial nerves grossly intact. moves all 4 extremities w/o difficulty. Affect pleasant  Telemetry   NSR 80s (personally reviewed)    Labs    CBC Recent Labs    12/30/19 0456 12/31/19 0438  WBC 11.2* 10.3  HGB 8.6* 8.2*  HCT 27.5* 26.7*  MCV 84.9 84.2  PLT 230 409   Basic Metabolic Panel Recent Labs    12/28/19 1600 12/28/19 1600 12/29/19 0240 12/29/19 0240 12/30/19 0456 12/31/19 0438  NA 134*   < > 132*   < > 134* 135  K 5.1   < > 5.3*   < > 5.0 4.8  CL 100   < > 97*   < > 98 99  CO2 23   < > 22   < > 27 25  GLUCOSE 143*   < >  104*   < > 107* 93  BUN 60*   < > 68*   < > 38* 52*  CREATININE 5.01*   < > 5.60*   < > 4.56* 5.39*  CALCIUM 7.1*   < > 7.4*   < > 7.7* 7.5*  MG  --   --  2.4  --   --   --   PHOS 7.1*  --   --   --   --   --    < > = values in this interval not displayed.   Liver Function Tests Recent Labs    12/28/19 1600 12/29/19 0240  AST  --  37  ALT  --  69*  ALKPHOS  --  231*  BILITOT  --  0.8  PROT  --  5.7*  ALBUMIN 2.3* 2.3*   No results for input(s): LIPASE, AMYLASE in the last 72 hours. Cardiac Enzymes No results for input(s): CKTOTAL, CKMB, CKMBINDEX, TROPONINI in the last 72 hours.  BNP: BNP (last 3 results) No results for input(s): BNP in the last 8760 hours.  ProBNP (last 3 results) No results for  input(s): PROBNP in the last 8760 hours.   D-Dimer No results for input(s): DDIMER in the last 72 hours. Hemoglobin A1C No results for input(s): HGBA1C in the last 72 hours. Fasting Lipid Panel No results for input(s): CHOL, HDL, LDLCALC, TRIG, CHOLHDL, LDLDIRECT in the last 72 hours. Thyroid Function Tests No results for input(s): TSH, T4TOTAL, T3FREE, THYROIDAB in the last 72 hours.  Invalid input(s): FREET3  Other results:   Imaging    No results found.   Medications:     Scheduled Medications: . amiodarone  200 mg Oral Daily  . apixaban  5 mg Oral BID  . Apremilast  1 tablet Oral Daily  . aspirin EC  81 mg Oral Daily  . atorvastatin  80 mg Oral Daily  . bisacodyl  10 mg Oral Daily   Or  . bisacodyl  10 mg Rectal Daily  . chlorhexidine gluconate (MEDLINE KIT)  15 mL Mouth Rinse BID  . Chlorhexidine Gluconate Cloth  6 each Topical Daily  . darbepoetin (ARANESP) injection - DIALYSIS  60 mcg Intravenous Q Wed-HD  . docusate sodium  200 mg Oral Daily  . feeding supplement (ENSURE ENLIVE)  237 mL Oral BID BM  . insulin aspart  0-24 Units Subcutaneous Q4H  . insulin detemir  5 Units Subcutaneous BID  . multivitamin  1 tablet Oral QHS  . pantoprazole  40 mg Oral Daily  . sodium chloride flush  3 mL Intravenous Q12H  . tacrolimus  1 mg Oral BID  . venlafaxine XR  37.5 mg Oral Daily    Infusions: . sodium chloride Stopped (12/21/19 1602)  . sodium chloride    . sodium chloride      PRN Medications: sodium chloride, sodium chloride, alteplase, Gerhardt's butt cream, heparin, HYDROmorphone, insulin aspart, lidocaine (PF), lidocaine-prilocaine, LORazepam, melatonin, metoprolol tartrate, ondansetron (ZOFRAN) IV, pentafluoroprop-tetrafluoroeth, senna-docusate, sodium chloride, sodium chloride flush, traMADol    Assessment/Plan   1. CAD: Severe 3VD on cath 7/21 => 60-70% ostial LAD, 80% mLAD, 95% ramus, 99% proximal LCx with TIMI-2 flow down the rest of the LCx,  occluded distal RCA.  Suspect out of hospital inferolateral MI.  Cardiac MRI showed that the inferolateral wall is likely not viable, but the remainder of the LV myocardium is likely viable. He had CABG 7/16 with LIMA-LAD, RIMA-ramus, seq SVG-PD/PL, SVG-D. No chest pain.  -  Continue ASA + atorvastatin.   - Continue to Mobilize w/ PT 2. Acute on chronic systolic CHF: Echo with EF 20-25%, moderately decreased RV systolic function. Cardiac MRI with LV EF 26%, RV EF 38%, near full thickness scar in inferolateral wall suggesting lack of viability in the LCx territory.  Now post-CABG. Repeat limited echo 7/20 showed LVEF 20-25%, RV normal, no pericardial effusion. Remains oliguric. CVVHD stopped 7/23. Now on iHD, MWF for volume control. Off inotropes/ pressors. Milrinone stopped 7/25. Co-ox marginal but improved from yesterday, 49>>54% today.    - Volume management per Renal.  iHD MWF - Now off midodrine.  - no ARB/ARNI, dig nor spiro w/ renal failure 3. Atrial fibrillation: Patient admitted with afib/RVR. Remains in NSR. S/p Maze + LAA clipping on 7/16.  - Now on amio 200 po daily. - on Eliquis now. hgb stable at 8.2.  4. AKI on CKD stage 3: Creatine has been stable 1.9-2.3 range in hospital pre-op.  Was 1.3 back in 4/21. Creatinine rose to 6.46 post-op. Suspect post-op ATN. CRRT initiated 7/19. Remains oliguric. CVVHD stopped 7/23, now tolerating iHD.  UOP about 100 cc yesterday.  - Management per Renal.  5. Type 2 diabetes: SSI.  6. Liver transplant for HCV: Tacrolimus trough 3 on 7/24, continue current dose. Now off stress dose steroids (not on steroids at home prior).  7. ID: 7/19 developed hypotension w/ increasing pressor support, increasing leukocytosis and CXR w/ hazy opacities. Started on empiric abx for ? HCAP with vancomycin/cefepime => now off. BC NGTD. Leukocytosis resolved. AF.  - no change  8. Hyperkalemia: K down to 4.8 today.  - HD per nephrology  - can use PRN Veltassa on non HD days    Lyda Jester, PA-C 12/31/2019  Patient seen with PA, agree with the above note.    Stable today, seen in HD.  Co-ox higher at 54%.  He is in NSR on apixaban and amiodarone.  Has been walking in hall without difficulty.    Think we can stop following co-ox.  Would like to try him on a low dose Coreg 3.125 mg bid.    Loralie Champagne 12/31/2019 9:10 AM

## 2019-12-31 NOTE — Progress Notes (Addendum)
[address]       [place] [address]             [phone]      12 Days Post-Op Procedure(s) (LRB): CORONARY ARTERY BYPASS GRAFTING (CABG) TIMES  X 5,   USING BILATERAL MAMMARY ARTERY AND RIGHT LEG GREATER SAPHENOUS VEIN HARVESTED ENDOSCOPICALLY (N/A) TRANSESOPHAGEAL ECHOCARDIOGRAM (TEE) (N/A) INDOCYANINE GREEN FLUORESCENCE IMAGING (ICG) (N/A) MAZE (N/A) CLIPPING OF ATRIAL APPENDAGE USING ATRICURE CLIP SIZE 50MM (N/A) Subjective: Has progressed too well for CIR Applying for outpatient dialysis in [facility] per nephrology navigator   Objective: Vital signs in last 24 hours: Temp:  [97.9 F (36.6 C)-98.4 F (36.9 C)] 98.4 F (36.9 C) (07/28 0705) Pulse Rate:  [71-78] 76 (07/28 0800) Cardiac Rhythm: Normal sinus rhythm (07/28 0710) Resp:  [14-32] 16 (07/28 0800) BP: (110-125)/(60-71) 125/68 (07/28 0800) SpO2:  [93 %-98 %] 95 % (07/28 0730) Weight:  [83.1 kg] 83.1 kg (07/28 0705)  Hemodynamic parameters for last 24 hours: CVP:  [9 mmHg-12 mmHg] 12 mmHg  Intake/Output from previous day: 07/27 0701 - 07/28 0700 In: 910 [P.O.:910] Out: 100 [Urine:100] Intake/Output this shift: No intake/output data recorded.  General appearance: alert, cooperative and no distress Heart: regular rate and rhythm Lungs: clear to auscultation bilaterally Abdomen: benign Extremities: no edema Wound: incis healing well  Lab Results: Recent Labs    12/30/19 0456 12/31/19 0438  WBC 11.2* 10.3  HGB 8.6* 8.2*  HCT 27.5* 26.7*  PLT 230 249   BMET:  Recent Labs    12/30/19 0456 12/31/19 0438  NA 134* 135  K 5.0 4.8  CL 98 99  CO2 27 25  GLUCOSE 107* 93  BUN 38* 52*  CREATININE 4.56* 5.39*  CALCIUM 7.7* 7.5*    PT/INR:  Recent Labs    12/29/19 0629  LABPROT 16.2*  INR 1.4*   ABG    Component Value Date/Time   PHART 7.241 (L) 12/22/2019 1411   HCO3 18.3 (L) 12/22/2019 1411   TCO2 20 (L) 12/22/2019 1411   ACIDBASEDEF 8.0 (H) 12/22/2019 1411   O2SAT  53.6 12/31/2019 0430   CBG (last 3)  Recent Labs    12/30/19 2034 12/30/19 2354 12/31/19 0351  GLUCAP 143* 98 94    Meds Scheduled Meds: . amiodarone  200 mg Oral Daily  . apixaban  5 mg Oral BID  . Apremilast  1 tablet Oral Daily  . aspirin EC  81 mg Oral Daily  . atorvastatin  80 mg Oral Daily  . bisacodyl  10 mg Oral Daily   Or  . bisacodyl  10 mg Rectal Daily  . chlorhexidine gluconate (MEDLINE KIT)  15 mL Mouth Rinse BID  . Chlorhexidine Gluconate Cloth  6 each Topical Daily  . darbepoetin (ARANESP) injection - DIALYSIS  60 mcg Intravenous Q Wed-HD  . docusate sodium  200 mg Oral Daily  . feeding supplement (ENSURE ENLIVE)  237 mL Oral BID BM  . insulin aspart  0-24 Units Subcutaneous Q4H  . insulin detemir  5 Units Subcutaneous BID  . multivitamin  1 tablet Oral QHS  . pantoprazole  40 mg Oral Daily  . sodium chloride flush  3 mL Intravenous Q12H  . tacrolimus  1 mg Oral BID  . venlafaxine XR  37.5 mg Oral Daily   Continuous Infusions: . sodium chloride Stopped (12/21/19 1602)  . sodium chloride    . sodium chloride     PRN Meds:.sodium chloride, sodium chloride,  alteplase, Gerhardt's butt cream, heparin, HYDROmorphone, insulin aspart, lidocaine (PF), lidocaine-prilocaine, LORazepam, melatonin, metoprolol tartrate, ondansetron (ZOFRAN) IV, pentafluoroprop-tetrafluoroeth, senna-docusate, sodium chloride, sodium chloride flush, traMADol  Xrays IR Fluoro Guide CV Line Right  Result Date: 12/30/2019 CLINICAL DATA:  Acute kidney injury, needs durable venous access for hemodialysis EXAM: TUNNELED HEMODIALYSIS CATHETER PLACEMENT WITH ULTRASOUND AND FLUOROSCOPIC GUIDANCE TECHNIQUE: The procedure, risks, benefits, and alternatives were explained to the patient. Questions regarding the procedure were encouraged and answered. The patient understands and consents to the procedure. Patency of the right IJ vein was confirmed with ultrasound with image documentation. An appropriate  skin site was determined. Region was prepped using maximum barrier technique including cap and mask, sterile gown, sterile gloves, large sterile sheet, and Chlorhexidine as cutaneous antisepsis. The region was infiltrated locally with 1% lidocaine. Intravenous Fentanyl 58mg and Versed 149mwere administered as conscious sedation during continuous monitoring of the patient's level of consciousness and physiological / cardiorespiratory status by the radiology RN, with a total moderate sedation time of 11 minutes. Under real-time ultrasound guidance, the right IJ vein was accessed with a 21 gauge micropuncture needle; the needle tip within the vein was confirmed with ultrasound image documentation. Needle exchanged over the 018 guidewire for transitional dilator, which allowed advancement of a Benson wire into the IVC. Over this, an MPA catheter was advanced. A Palindrome 23 hemodialysis catheter was tunneled from the right anterior chest wall approach to the right IJ dermatotomy site. The MPA catheter was exchanged over an Amplatz wire for serial vascular dilators which allow placement of a peel-away sheath, through which the catheter was advanced under intermittent fluoroscopy, positioned with its tips in the proximal and midright atrium. Spot chest radiograph confirms good catheter position. No pneumothorax. Catheter was flushed and primed per protocol. Catheter secured externally with O Prolene sutures. The right IJ dermatotomy site was closed with Dermabond. COMPLICATIONS: COMPLICATIONS None immediate FLUOROSCOPY TIME:  24 seconds; 2 mGy COMPARISON:  None IMPRESSION: 1. Technically successful placement of tunneled right IJ hemodialysis catheter with ultrasound and fluoroscopic guidance. Ready for routine use. ACCESS: Remains approachable for percutaneous intervention as needed. Electronically Signed   By: D Lucrezia Europe.D.   On: 12/30/2019 13:16   IR USKoreauide Vasc Access Right  Result Date: 12/30/2019 CLINICAL  DATA:  Acute kidney injury, needs durable venous access for hemodialysis EXAM: TUNNELED HEMODIALYSIS CATHETER PLACEMENT WITH ULTRASOUND AND FLUOROSCOPIC GUIDANCE TECHNIQUE: The procedure, risks, benefits, and alternatives were explained to the patient. Questions regarding the procedure were encouraged and answered. The patient understands and consents to the procedure. Patency of the right IJ vein was confirmed with ultrasound with image documentation. An appropriate skin site was determined. Region was prepped using maximum barrier technique including cap and mask, sterile gown, sterile gloves, large sterile sheet, and Chlorhexidine as cutaneous antisepsis. The region was infiltrated locally with 1% lidocaine. Intravenous Fentanyl 5051mand Versed 1mg76mre administered as conscious sedation during continuous monitoring of the patient's level of consciousness and physiological / cardiorespiratory status by the radiology RN, with a total moderate sedation time of 11 minutes. Under real-time ultrasound guidance, the right IJ vein was accessed with a 21 gauge micropuncture needle; the needle tip within the vein was confirmed with ultrasound image documentation. Needle exchanged over the 018 guidewire for transitional dilator, which allowed advancement of a Benson wire into the IVC. Over this, an MPA catheter was advanced. A Palindrome 23 hemodialysis catheter was tunneled from the right anterior chest wall approach to the right  IJ dermatotomy site. The MPA catheter was exchanged over an Amplatz wire for serial vascular dilators which allow placement of a peel-away sheath, through which the catheter was advanced under intermittent fluoroscopy, positioned with its tips in the proximal and midright atrium. Spot chest radiograph confirms good catheter position. No pneumothorax. Catheter was flushed and primed per protocol. Catheter secured externally with O Prolene sutures. The right IJ dermatotomy site was closed with  Dermabond. COMPLICATIONS: COMPLICATIONS None immediate FLUOROSCOPY TIME:  24 seconds; 2 mGy COMPARISON:  None IMPRESSION: 1. Technically successful placement of tunneled right IJ hemodialysis catheter with ultrasound and fluoroscopic guidance. Ready for routine use. ACCESS: Remains approachable for percutaneous intervention as needed. Electronically Signed   By: Lucrezia Europe M.D.   On: 12/30/2019 13:16    Assessment/Plan: S/P Procedure(s) (LRB): CORONARY ARTERY BYPASS GRAFTING (CABG) TIMES  X 5,   USING BILATERAL MAMMARY ARTERY AND RIGHT LEG GREATER SAPHENOUS VEIN HARVESTED ENDOSCOPICALLY (N/A) TRANSESOPHAGEAL ECHOCARDIOGRAM (TEE) (N/A) INDOCYANINE GREEN FLUORESCENCE IMAGING (ICG) (N/A) MAZE (N/A) CLIPPING OF ATRIAL APPENDAGE USING ATRICURE CLIP SIZE 50MM (N/A)  1 doing well 2 starting dialysis- arranging for outpatient 3 HHPT and OT to be arranged- will place TOC referral 4 H/H are pretty stable, dialysis cath placed yesterday 5 no leukocytosis or fevers, VSS off gtts 6 BS well controlled, not on DM not on meds preop 7 will need EPW's removed 8 home when able to arrange for all the home needs    LOS: 19 days    John Giovanni PA-C Pager [phone]  12/31/2019

## 2019-12-31 NOTE — Progress Notes (Signed)
1352-1400 Came to see pt to walk. He is up in chair. Stated he is feeling tired after HD and eating lunch. Would like to walk later this evening. Encouraged him to ask staff to walk later with rollator. Encouraged IS. Graylon Good RN BSN 12/31/2019 1:57 PM

## 2020-01-01 DIAGNOSIS — N186 End stage renal disease: Secondary | ICD-10-CM

## 2020-01-01 LAB — BASIC METABOLIC PANEL
Anion gap: 9 (ref 5–15)
BUN: 24 mg/dL — ABNORMAL HIGH (ref 8–23)
CO2: 29 mmol/L (ref 22–32)
Calcium: 7.9 mg/dL — ABNORMAL LOW (ref 8.9–10.3)
Chloride: 97 mmol/L — ABNORMAL LOW (ref 98–111)
Creatinine, Ser: 4.15 mg/dL — ABNORMAL HIGH (ref 0.61–1.24)
GFR calc Af Amer: 17 mL/min — ABNORMAL LOW (ref >60)
GFR calc non Af Amer: 14 mL/min — ABNORMAL LOW (ref >60)
Glucose, Bld: 98 mg/dL (ref 70–99)
Potassium: 4.7 mmol/L (ref 3.5–5.1)
Sodium: 135 mmol/L (ref 135–145)

## 2020-01-01 LAB — GLUCOSE, CAPILLARY
Glucose-Capillary: 84 mg/dL (ref 70–99)
Glucose-Capillary: 160 mg/dL — ABNORMAL HIGH (ref 70–99)

## 2020-01-01 LAB — CBC
HCT: 27.7 % — ABNORMAL LOW (ref 39.0–52.0)
Hemoglobin: 8.4 g/dL — ABNORMAL LOW (ref 13.0–17.0)
MCH: 25.5 pg — ABNORMAL LOW (ref 26.0–34.0)
MCHC: 30.3 g/dL (ref 30.0–36.0)
MCV: 84.2 fL (ref 80.0–100.0)
Platelets: 275 10*3/uL (ref 150–400)
RBC: 3.29 MIL/uL — ABNORMAL LOW (ref 4.22–5.81)
RDW: 16 % — ABNORMAL HIGH (ref 11.5–15.5)
WBC: 8 10*3/uL (ref 4.0–10.5)
nRBC: 0 % (ref 0.0–0.2)

## 2020-01-01 MED ORDER — TACROLIMUS 1 MG PO CAPS: 1.0000 mg | ORAL_CAPSULE | Freq: Two times a day (BID) | ORAL | 1 refills | Status: DC

## 2020-01-01 MED ORDER — APIXABAN 5 MG PO TABS: 5.0000 mg | ORAL_TABLET | Freq: Two times a day (BID) | ORAL | 2 refills | Status: DC

## 2020-01-01 MED ORDER — RENA-VITE PO TABS: 1.0000 | ORAL_TABLET | Freq: Every day | ORAL | 2 refills | Status: DC

## 2020-01-01 MED ORDER — AMIODARONE HCL 200 MG PO TABS: 200.0000 mg | ORAL_TABLET | Freq: Every day | ORAL | 1 refills | Status: DC

## 2020-01-01 MED ORDER — ATORVASTATIN CALCIUM 80 MG PO TABS: 80.0000 mg | ORAL_TABLET | Freq: Every day | ORAL | 1 refills | Status: DC

## 2020-01-01 MED ORDER — PANTOPRAZOLE SODIUM 40 MG PO TBEC: 40.0000 mg | DELAYED_RELEASE_TABLET | Freq: Every day | ORAL | 1 refills | Status: DC

## 2020-01-01 MED ORDER — CARVEDILOL 3.125 MG PO TABS: 3.1250 mg | ORAL_TABLET | Freq: Two times a day (BID) | ORAL | 1 refills | Status: DC

## 2020-01-01 MED ORDER — TRAMADOL HCL 50 MG PO TABS: 50.0000 mg | ORAL_TABLET | Freq: Four times a day (QID) | ORAL | 0 refills | Status: AC | PRN

## 2020-01-01 MED ORDER — DARBEPOETIN ALFA 60 MCG/0.3ML IJ SOSY: 60.0000 ug | PREFILLED_SYRINGE | INTRAMUSCULAR | Status: DC

## 2020-01-01 MED ORDER — ASPIRIN 81 MG PO TBEC: 81.0000 mg | DELAYED_RELEASE_TABLET | Freq: Every day | ORAL | Status: DC

## 2020-01-01 MED FILL — Vasopressin IV Soln 20 Unit/ML (For IV Infusion): INTRAVENOUS | Qty: 1 | Status: AC

## 2020-01-01 MED FILL — Sodium Chloride IV Soln 0.9%: INTRAVENOUS | Qty: 100 | Status: AC

## 2020-01-01 MED FILL — ELIQUIS 5 MG TABLET: 5 | 30 days supply | Qty: 60 | Fill #0

## 2020-01-01 NOTE — Progress Notes (Signed)
Patient's HD cath dressing has blood since he came back from dialysis yesterday. No new drainage observed through out night. Information passed on to the day shift nurse.

## 2020-01-01 NOTE — Progress Notes (Signed)
Patient has been accepted for OP HD treatment at [facility] on a MWF schedule with a seat time of 11:45am. He needs to arrive at 10:35am on his first day at the clinic in order to complete intake paperwork prior to his first treatment. Renal Navigator informed Nephrologist and patient. Patient appreciative and understanding.   Alphonzo Cruise, [place] Renal Navigator [phone]

## 2020-01-01 NOTE — Progress Notes (Signed)
Admit: 12/11/2019 LOS: 20  89M dialysis dependent AKI following CABG/MAZE/LAA CLip 7/16; hx/o liver transplant on tacrolimus  Subjective:  . HD yesterday 3L UF usign R IJ TDC . Some bleeding from the catheter . No c/o this AM  07/28 0701 - 07/29 0700 In: 120 [P.O.:120] Out: 3000   Filed Weights   12/31/19 0705 12/31/19 1110 01/01/20 0359  Weight: 83.1 kg 80 kg 80.7 kg    Scheduled Meds: . amiodarone  200 mg Oral Daily  . apixaban  5 mg Oral BID  . Apremilast  1 tablet Oral Daily  . aspirin EC  81 mg Oral Daily  . atorvastatin  80 mg Oral Daily  . bisacodyl  10 mg Oral Daily   Or  . bisacodyl  10 mg Rectal Daily  . carvedilol  3.125 mg Oral BID WC  . chlorhexidine gluconate (MEDLINE KIT)  15 mL Mouth Rinse BID  . Chlorhexidine Gluconate Cloth  6 each Topical Daily  . darbepoetin (ARANESP) injection - DIALYSIS  60 mcg Intravenous Q Wed-HD  . docusate sodium  200 mg Oral Daily  . feeding supplement (ENSURE ENLIVE)  237 mL Oral BID BM  . insulin aspart  0-24 Units Subcutaneous Q4H  . insulin detemir  5 Units Subcutaneous BID  . multivitamin  1 tablet Oral QHS  . pantoprazole  40 mg Oral Daily  . sodium chloride flush  3 mL Intravenous Q12H  . tacrolimus  1 mg Oral BID  . venlafaxine XR  37.5 mg Oral Daily   Continuous Infusions: . sodium chloride Stopped (12/21/19 1602)   PRN Meds:.Gerhardt's butt cream, HYDROmorphone, insulin aspart, LORazepam, melatonin, metoprolol tartrate, ondansetron (ZOFRAN) IV, senna-docusate, sodium chloride, sodium chloride flush, traMADol  Current Labs: reviewed    Physical Exam:  Blood pressure (!) 104/61, pulse 73, temperature 97.9 F (36.6 C), temperature source Oral, resp. rate 16, height 5' 10" (1.778 m), weight 80.7 kg, SpO2 98 %. NAD External incision clean/dry/intact Regular, normal S1 and S2 Clear bilaterally Right IJ tunneled HD catheter some blood on bandage, not acute, No significant peripheral edema Soft,  nontender  A 1. Dialysis dependent AKI, baseline CKD 3 with creatinine 1.2-1.5 2. Status post CABG/maze/left atrial appendage clipping on 7/16 3. Acute HFrEF, improved 4. Status post liver transplant, followed by [facility], on tacrolimus 5. Atrial fibrillation, on amiodarone and apixaban 6. DM2 7. Hyperkalemia, mild, stable 8. Anemia, hemoglobin 8s 9. CKD-BMD, trend P on HD, no meds at this time  P . OK For DC today, outpt HD tomorrow from renal perspective . Continue HD on MWF schedule, 2K, 4h, 400/800, 2-3L UF, No heparin, TDC . CLIP'd [facility] MWF 2nd shift . No permanent vascular access for now, moderate chance of GFR recovery . Check Fe levels, start ESA . Medication Issues; o Preferred narcotic agents for pain control are hydromorphone, fentanyl, and methadone. Morphine should not be used.  o Baclofen should be avoided o Avoid oral sodium phosphate and magnesium citrate based laxatives / bowel preps    Pearson Grippe MD 01/01/2020, 10:49 AM  Recent Labs  Lab 12/27/19 1600 12/27/19 2000 12/28/19 0226 12/28/19 1600 12/29/19 0240 12/30/19 0456 12/31/19 0438 01/01/20 0309  NA 134*  --    < > 134*   < > 134* 135 135  K 4.7  --    < > 5.1   < > 5.0 4.8 4.7  CL 97*  --    < > 100   < > 98 99 97*  CO2 24  --    < > 23   < > _0 GLUCOSE 173*  --    < > 143*   < > 107* 93 98  BUN 46*  --    < > 60*   < > 38* 52* 24*  CREATININE 3.70*  --    < > 5.01*   < > 4.56* 5.39* 4.15*  CALCIUM 7.2*  --    < > 7.1*   < > 7.7* 7.5* 7.9*  PHOS 7.5* 6.0*  --  7.1*  --   --   --   --    < > = values in this interval not displayed.   Recent Labs  Lab 12/30/19 0456 12/31/19 0438 01/01/20 0309  WBC 11.2* 10.3 8.0  HGB 8.6* 8.2* 8.4*  HCT 27.5* 26.7* 27.7*  MCV 84.9 84.2 84.2  PLT 230 249 275

## 2020-01-01 NOTE — Care Management Important Message (Signed)
Important Message  Patient Details  Name: Joseph Greer MRN: [id number] Date of Birth: 1955-07-09   Medicare Important Message Given:  Yes     Orbie Pyo 01/01/2020, 3:16 PM

## 2020-01-01 NOTE — TOC Benefit Eligibility Note (Signed)
Transition of Care [facility]) Benefit Eligibility Note    Patient Details  Name: Joseph Greer MRN: [id number] Date of Birth: November 30, 1955   Medication/Dose: Arne Cleveland  5 MG BID  Covered?: Yes  Tier: 3 Drug  Prescription Coverage Preferred Pharmacy: [facility] [place] with Person/Company/Phone Number:: [facility]  @  [employer] DV # [phone]  Co-Pay: $47.00  Prior Approval: No  Deductible: Unmet       Memory Argue Phone Number: 01/01/2020, 11:26 AM

## 2020-01-01 NOTE — Progress Notes (Signed)
This note also relates to the following rows which could not be included: ECG Heart Rate - Cannot attach notes to unvalidated device data  Dr. Orvan Seen pulled pacing wires

## 2020-01-01 NOTE — Plan of Care (Signed)
°  Problem: Education: Goal: Knowledge of General Education information will improve Description: Including pain rating scale, medication(s)/side effects and non-pharmacologic comfort measures Outcome: Progressing   Problem: Health Behavior/Discharge Planning: Goal: Ability to manage health-related needs will improve Outcome: Progressing   Problem: Clinical Measurements: Goal: Ability to maintain clinical measurements within normal limits will improve Outcome: Progressing Goal: Will remain free from infection Outcome: Progressing Goal: Diagnostic test results will improve Outcome: Progressing Goal: Respiratory complications will improve Outcome: Progressing Goal: Cardiovascular complication will be avoided Outcome: Progressing   Problem: Activity: Goal: Risk for activity intolerance will decrease Outcome: Progressing   Problem: Nutrition: Goal: Adequate nutrition will be maintained Outcome: Progressing   Problem: Coping: Goal: Level of anxiety will decrease Outcome: Progressing   Problem: Elimination: Goal: Will not experience complications related to bowel motility Outcome: Progressing Goal: Will not experience complications related to urinary retention Outcome: Progressing   Problem: Pain Managment: Goal: General experience of comfort will improve Outcome: Progressing   Problem: Safety: Goal: Ability to remain free from injury will improve Outcome: Progressing   Problem: Skin Integrity: Goal: Risk for impaired skin integrity will decrease Outcome: Progressing   Problem: Education: Goal: Knowledge of disease or condition will improve Outcome: Progressing Goal: Understanding of medication regimen will improve Outcome: Progressing Goal: Individualized Educational Video(s) Outcome: Progressing   Problem: Activity: Goal: Ability to tolerate increased activity will improve Outcome: Progressing   Problem: Cardiac: Goal: Ability to achieve and maintain  adequate cardiopulmonary perfusion will improve Outcome: Progressing   Problem: Health Behavior/Discharge Planning: Goal: Ability to safely manage health-related needs after discharge will improve Outcome: Progressing   Problem: Education: Goal: Understanding of CV disease, CV risk reduction, and recovery process will improve Outcome: Progressing   Problem: Cardiovascular: Goal: Ability to achieve and maintain adequate cardiovascular perfusion will improve Outcome: Progressing Goal: Vascular access site(s) Level 0-1 will be maintained Outcome: Progressing   Problem: Education: Goal: Will demonstrate proper wound care and an understanding of methods to prevent future damage Outcome: Progressing Goal: Knowledge of disease or condition will improve Outcome: Progressing Goal: Knowledge of the prescribed therapeutic regimen will improve Outcome: Progressing Goal: Individualized Educational Video(s) Outcome: Progressing   Problem: Activity: Goal: Risk for activity intolerance will decrease Outcome: Progressing   Problem: Cardiac: Goal: Will achieve and/or maintain hemodynamic stability Outcome: Progressing   Problem: Clinical Measurements: Goal: Postoperative complications will be avoided or minimized Outcome: Progressing   Problem: Respiratory: Goal: Respiratory status will improve Outcome: Progressing   Problem: Skin Integrity: Goal: Wound healing without signs and symptoms of infection Outcome: Progressing Goal: Risk for impaired skin integrity will decrease Outcome: Progressing   Problem: Urinary Elimination: Goal: Ability to achieve and maintain adequate renal perfusion and functioning will improve Outcome: Progressing

## 2020-01-01 NOTE — TOC Progression Note (Signed)
Transition of Care [facility]) - Progression Note    Patient Details  Name: Joseph Greer MRN: [id number] Date of Birth: 08/30/1955  [facility]) CM/SW Contact  Zenon Mayo, RN Phone Number: 01/01/2020, 10:58 AM  Clinical Narrative:    Patient is for dc today, NCM offered choice, he has no preference, NCM made referral to [place] with [facility], she is able to take referral. Soc will begin on Sunday.  He has no other needs.     Barriers to Discharge: No Barriers Identified  Expected Discharge Plan and Services           Expected Discharge Date: 01/01/20                 DME Agency: NA       HH Arranged: PT, OT HH Agency: [facility] (formerly [employer]) Date [place]: 01/01/20 Time [place]: 1058 Representative spoke with at [place]: [place] ([place]) Interventions    Readmission Risk Interventions No flowsheet data found.

## 2020-01-01 NOTE — Discharge Instructions (Signed)
[facility] [phone]    Dialysis Dialysis is a procedure that is done when the kidneys have stopped working properly (kidney failure). It may also be done earlier if it may help improve symptoms. During dialysis, wastes, salt, and extra water are removed from the blood, and the levels of certain minerals in the blood are maintained. Dialysis is done in sessions which are continued until the kidneys get better. If the kidneys cannot get better, such as in end-stage kidney disease, dialysis is continued for life or until you receive a new kidney from a donor (kidney transplant). There are two types of dialysis: hemodialysis and peritoneal dialysis. What is hemodialysis?        Hemodialysis is when a machine called a dialyzer is used to filter the blood. Before starting hemodialysis, you will have surgery to create a site where blood can be removed from the body and returned to the body (vascular access). There are three types of vascular accesses:  Arteriovenous fistula. This type of access is created when an artery and a vein (usually in the arm) are connected during surgery. The arteriovenous fistula usually takes 1-6 months to develop after surgery. It may last longer than the other types of vascular accesses and is less likely to become infected or cause blood clots.  Arteriovenous graft. This type of access is created when an artery and a vein in the arm are connected during surgery with a tube. An arteriovenous graft can usually be used within 2-3 weeks of surgery.  A venous catheter. To create this type of access, a thin tube (catheter) is placed in a large vein in your neck, chest, or groin. A venous catheter can be used right away. It is usually used as a temporary access when dialysis needs to begin immediately. During hemodialysis, blood leaves your body through your access site. It travels through a tube to the dialyzer, where it is filtered. The blood then returns to your body through  another tube. Hemodialysis is usually done at a hospital or dialysis center three times a week. Visits last about 3-5 hours. With special training, it may also be done at home with the help of another person. What is peritoneal dialysis? Peritoneal dialysis is when the thin lining of the abdomen (peritoneum) and a fluid called dialysate are used to filter the blood. Before starting peritoneal dialysis, you will have surgery to place a catheter in your abdomen. The catheter will be used to transfer dialysate to and from your abdomen. At the start of a session, your abdomen is filled with dialysate. During the session, wastes, salt, and extra water in the blood pass through the peritoneum and into the dialysate. The dialysate is drained from the body at the end of the session. The process of filling and draining the dialysate is called an exchange. Exchanges are repeated until you have used up all the dialysate for the day. You may do peritoneal dialysis at home or at almost any other location. It is done every day. You may need up to five exchanges a day. Each exchange takes about 30-40 minutes. The amount of time the dialysate is in your body between exchanges is called a dwell. The dwell usually lasts 1.5-3 hours and can vary with each person. You may choose to do exchanges at night while you sleep, using a machine called a cycler. Which type of dialysis should I choose? Both types of dialysis have advantages and disadvantages. Talk with your health care provider about which  type of dialysis is best for you. Your lifestyle, preferences, and medical condition should be considered. In some cases, only one type of dialysis can be chosen. Advantages of hemodialysis  It is done less often than peritoneal dialysis.  Someone else can do the dialysis for you.  If you go to a dialysis center: ? Your health care provider can recognize any problems you may be having. ? You can interact with others who are  having dialysis. This can provide you with emotional support. Disadvantages of hemodialysis  Hemodialysis may cause cramps and low blood pressure. It may leave you feeling tired on the days you have the treatment.  If you go to a dialysis center, you will need to make weekly appointments and work around the centers schedule.  You will need to take extra care when traveling. If you usually get treatment in a dialysis center, you will need to arrange to visit a dialysis center near your destination. If you are having treatments at home, you will need to take the dialyzer with you when traveling.  There are more eating restrictions than with peritoneal dialysis. Advantages of peritoneal dialysis  It is less likely than hemodialysis to cause cramps and low blood pressure.  There are fewer eating restrictions than with hemodialysis.  You may do exchanges on your own wherever you are, including when you travel. Disadvantages of peritoneal dialysis  It is done more often than hemodialysis.  Doing peritoneal dialysis requires you to have a good use (dexterity) of your hands. You must also be able to lift bags.  You must learn how to make your equipment free of germs (sterilization techniques). You will need to use these techniques every day to prevent infection. What changes will I need to make to my diet during dialysis? Both types of dialysis require you to make some changes to your diet. For example, you will need to limit your intake of foods that contain a lot of phosphorus and potassium. You will also need to limit your fluid intake. A diet and nutrition specialist (dietitian) can help you make a meal plan that can help improve your dialysis and your health. What should I expect when starting dialysis? Adjusting to the dialysis treatment, schedule, and diet can take some time. You may need to stop working and may not be able to do some of your normal activities. You may feel anxious or  depressed when starting dialysis. Over time, many people feel better overall because of dialysis. You may be able to return to work after making some changes, such as reducing work intensity. Where to find more information  [place]: www.kidney.org  American Association of Kidney Patients: BombTimer.gl  [employer]: www.kidneyfund.org Summary  During dialysis, wastes, salt, and extra water are removed from the blood, and the levels of certain minerals in the blood are maintained. There are two types of dialysis: hemodialysis and peritoneal dialysis.  Hemodialysis is when a machine called a dialyzer is used to filter the blood.  Hemodialysis is usually done by a health care provider at a hospital or dialysis center three times a week.  Peritoneal dialysis is when the peritoneum is used as a filter. You may do peritoneal dialysis at home or at almost any other location.  Both types of dialysis have advantages and disadvantages. Talk with your health care provider about which type of dialysis is best for you. This information is not intended to replace advice given to you by your health  care provider. Make sure you discuss any questions you have with your health care provider. Document Revised: 10/08/2018 Document Reviewed: 07/18/2016 Elsevier Patient Education  [place]. Endoscopic Saphenous Vein Harvesting, Care After This sheet gives you information about how to care for yourself after your procedure. Your health care provider may also give you more specific instructions. If you have problems or questions, contact your health care provider. What can I expect after the procedure? After the procedure, it is common to have:  Pain.  Bruising.  Swelling.  Numbness. Follow these instructions at home: Incision care   Follow instructions from your health care provider about how to take care of your incisions. Make sure you: ? Wash your hands with  soap and water before and after you change your bandages (dressings). If soap and water are not available, use hand sanitizer. ? Change your dressings as told by your health care provider. ? Leave stitches (sutures), skin glue, or adhesive strips in place. These skin closures may need to stay in place for 2 weeks or longer. If adhesive strip edges start to loosen and curl up, you may trim the loose edges. Do not remove adhesive strips completely unless your health care provider tells you to do that.  Check your incision areas every day for signs of infection. Check for: ? More redness, swelling, or pain. ? Fluid or blood. ? Warmth. ? Pus or a bad smell. Medicines  Take over-the-counter and prescription medicines only as told by your health care provider.  Ask your health care provider if the medicine prescribed to you requires you to avoid driving or using heavy machinery. General instructions  Raise (elevate) your legs above the level of your heart while you are sitting or lying down.  Avoid crossing your legs.  Avoid sitting for long periods of time. Change positions every 30 minutes.  Do any exercises your health care providers have given you. These may include deep breathing, coughing, and walking exercises.  Do not take baths, swim, or use a hot tub until your health care provider approves. Ask your health care provider if you may take showers. You may only be allowed to take sponge baths.  Wear compression stockings as told by your health care provider. These stockings help to prevent blood clots and reduce swelling in your legs.  Keep all follow-up visits as told by your health care provider. This is important. Contact a health care provider if:  Medicine does not help your pain.  Your pain gets worse.  You have new leg bruises or your leg bruises get bigger.  Your leg feels numb.  You have more redness, swelling, or pain around your incision.  You have fluid or blood  coming from your incision.  Your incision feels warm to the touch.  You have pus or a bad smell coming from your incision.  You have a fever. Get help right away if:  Your pain is severe.  You develop pain, tenderness, warmth, redness, or swelling in any part of your leg.  You have chest pain.  You have trouble breathing. Summary  Raise (elevate) your legs above the level of your heart while you are sitting or lying down.  Wear compression stockings as told by your health care provider.  Make sure you know which symptoms should prompt you to contact your health care provider.  Keep all follow-up visits as told by your health care provider. This information is not intended to replace advice given to  you by your health care provider. Make sure you discuss any questions you have with your health care provider. Document Revised: 04/29/2018 Document Reviewed: 04/29/2018 Elsevier Patient Education  [place]. Coronary Artery Bypass Grafting, Care After This sheet gives you information about how to care for yourself after your procedure. Your doctor may also give you more specific instructions. If you have problems or questions, call your doctor. What can I expect after the procedure? After the procedure, it is common to:  Feel sick to your stomach (nauseous).  Not want to eat as much as normal (lack of appetite).  Have trouble pooping (constipation).  Have weakness and tiredness (fatigue).  Feel sad (depressed) or grouchy (irritable).  Have pain or discomfort around the cuts from surgery (incisions). Follow these instructions at home: Medicines  Take over-the-counter and prescription medicines only as told by your doctor. Do not stop taking medicines or start any new medicines unless your doctor says it is okay.  If you were prescribed an antibiotic medicine, take it as told by your doctor. Do not stop taking the antibiotic even if you start to feel  better. Incision care   Follow instructions from your doctor about how to take care of your cuts from surgery. Make sure you: ? Wash your hands with soap and water before and after you change your bandage (dressing). If you cannot use soap and water, use hand sanitizer. ? Change your bandage as told by your doctor. ? Leave stitches (sutures), skin glue, or skin tape (adhesive) strips in place. They may need to stay in place for 2 weeks or longer. If tape strips get loose and curl up, you may trim the loose edges. Do not remove tape strips completely unless your doctor says it is okay.  Make sure the surgery cuts are clean, dry, and protected.  Check your cut areas every day for signs of infection. Check for: ? More redness, swelling, or pain. ? More fluid or blood. ? Warmth. ? Pus or a bad smell.  If cuts were made in your legs: ? Avoid crossing your legs. ? Avoid sitting for long periods of time. Change positions every 30 minutes. ? Raise (elevate) your legs when you are sitting. Bathing  Do not take baths, swim, or use a hot tub until your doctor says it is okay.  You may shower. Pat the surgery cuts dry. Do not rub the cuts to dry.   Eating and drinking   Eat foods that are high in fiber, such as beans, nuts, whole grains, and raw fruits and vegetables. Any meats you eat should be lean cut. Avoid canned, processed, and fried foods. This can help prevent trouble pooping. This is also a part of a heart-healthy diet.  Drink enough fluid to keep your pee (urine) pale yellow.  Do not drink alcohol until you are fully recovered. Ask your doctor when it is safe to drink alcohol. Activity  Rest and limit your activity as told by your doctor. You may be told to: ? Stop any activity right away if you have chest pain, shortness of breath, irregular heartbeats, or dizziness. Get help right away if you have any of these symptoms. ? Move around often for short periods or take short walks  as told by your doctor. Slowly increase your activities. ? Avoid lifting, pushing, or pulling anything that is heavier than 10 lb (4.5 kg) for at least 6 weeks or as told by your doctor.  Do physical therapy  or a cardiac rehab (cardiac rehabilitation) program as told by your doctor. ? Physical therapy involves doing exercises to maintain movement and build strength and endurance. ? A cardiac rehab program includes:  Exercise training.  Education.  Counseling.  Do not drive until your doctor says it is okay.  Ask your doctor when you can go back to work.  Ask your doctor when you can be sexually active. General instructions  Do not drive or use heavy machinery while taking prescription pain medicine.  Do not use any products that contain nicotine or tobacco. These include cigarettes, e-cigarettes, and chewing tobacco. If you need help quitting, ask your doctor.  Take 2-3 deep breaths every few hours during the day while you get better. This helps expand your lungs and prevent problems.  If you were given a device called an incentive spirometer, use it several times a day to practice deep breathing. Support your chest with a pillow or your arms when you take deep breaths or cough.  Wear compression stockings as told by your doctor.  Weigh yourself every day. This helps to see if your body is holding (retaining) fluid that may make your heart and lungs work harder.  Keep all follow-up visits as told by your doctor. This is important. Contact a doctor if:  You have more redness, swelling, or pain around any cut.  You have more fluid or blood coming from any cut.  Any cut feels warm to the touch.  You have pus or a bad smell coming from any cut.  You have a fever.  You have swelling in your ankles or legs.  You have pain in your legs.  You gain 2 lb (0.9 kg) or more a day.  You feel sick to your stomach or you throw up (vomit).  You have watery poop (diarrhea). Get  help right away if:  You have chest pain that goes to your jaw or arms.  You are short of breath.  You have a fast or irregular heartbeat.  You notice a "clicking" in your breastbone (sternum) when you move.  You have any signs of a stroke. "BE FAST" is an easy way to remember the main warning signs: ? B - Balance. Signs are dizziness, sudden trouble walking, or loss of balance. ? E - Eyes. Signs are trouble seeing or a change in how you see. ? F - Face. Signs are sudden weakness or loss of feeling of the face, or the face or eyelid drooping on one side. ? A - Arms. Signs are weakness or loss of feeling in an arm. This happens suddenly and usually on one side of the body. ? S - Speech. Signs are sudden trouble speaking, slurred speech, or trouble understanding what people say. ? T - Time. Time to call emergency services. Write down what time symptoms started.  You have other signs of a stroke, such as: ? A sudden, very bad headache with no known cause. ? Feeling sick to your stomach. ? Throwing up. ? Jerky movements you cannot control (seizure). These symptoms may be an emergency. Do not wait to see if the symptoms will go away. Get medical help right away. Call your local emergency services (911 in the [place] Do not drive yourself to the hospital. Summary  After the procedure, it is common to have pain or discomfort in the cuts from surgery (incisions).  Do not take baths, swim, or use a hot tub until your doctor says it is okay.  Slowly increase your activities. You may need physical therapy or cardiac rehab.  Weigh yourself every day. This helps to see if your body is holding fluid. This information is not intended to replace advice given to you by your health care provider. Make sure you discuss any questions you have with your health care provider. Document Revised: 01/29/2018 Document Reviewed: 01/29/2018 Elsevier Patient Education  2020 [place] on my  medicine - ELIQUIS (apixaban)  This medication education was reviewed with me or my healthcare representative as part of my discharge preparation.  The pharmacist that spoke with me during my hospital stay was:  AUDRIC VENN, [facility]  Why was Eliquis prescribed for you? Eliquis was prescribed for you to reduce the risk of a blood clot forming that can cause a stroke if you have a medical condition called atrial fibrillation (a type of irregular heartbeat).  What do You need to know about Eliquis ? Take your Eliquis TWICE DAILY - one tablet in the morning and one tablet in the evening with or without food. If you have difficulty swallowing the tablet whole please discuss with your pharmacist how to take the medication safely.  Take Eliquis exactly as prescribed by your doctor and DO NOT stop taking Eliquis without talking to the doctor who prescribed the medication.  Stopping may increase your risk of developing a stroke.  Refill your prescription before you run out.  After discharge, you should have regular check-up appointments with your healthcare provider that is prescribing your Eliquis.  In the future your dose may need to be changed if your kidney function or weight changes by a significant amount or as you get older.  What do you do if you miss a dose? If you miss a dose, take it as soon as you remember on the same day and resume taking twice daily.  Do not take more than one dose of ELIQUIS at the same time to make up a missed dose.  Important Safety Information A possible side effect of Eliquis is bleeding. You should call your healthcare provider right away if you experience any of the following: ? Bleeding from an injury or your nose that does not stop. ? Unusual colored urine (red or dark brown) or unusual colored stools (red or black). ? Unusual bruising for unknown reasons. ? A serious fall or if you hit your head (even if there is no bleeding).  Some medicines may  interact with Eliquis and might increase your risk of bleeding or clotting while on Eliquis. To help avoid this, consult your healthcare provider or pharmacist prior to using any new prescription or non-prescription medications, including herbals, vitamins, non-steroidal anti-inflammatory drugs (NSAIDs) and supplements.  This website has more information on Eliquis (apixaban): http://www.eliquis.com/eliquis/home

## 2020-01-01 NOTE — Progress Notes (Signed)
Patient ID: Joseph Greer, male   DOB: March 11, 1956, 64 y.o.   MRN: [id number]     Advanced Heart Failure Rounding Note  PCP-Cardiologist: Candee Furbish, MD  AHF:  Dr. [name]   Patient Profile   64 y/o male w/ h/o Hep C s/p liver transplant in 2009 (followed at [facility]), Stage III CKD, HTN and T2DM admitted w/ NSTEMI and Afib w/ RVR, found to have severe 3VD and biventricular heart failure and ? PFO, LVEF 20-25%, RV moderately reduced. S/p CABG + MAZE + LA appendage clipping 7/16.    Subjective:    - 7/16: CABG x 5 with LIMA-LAD, RIMA-ramus, seq SVG-PD/PL, SVG-D; Maze; LA appendage clipping. - 7/18 Limited echo: LVEF 35-40%. RV normal. No pericardial effusion.  - Post operative course c/b AKI and oliguria. CRRT initiated 7/19.  - 7/19 Milrinone discontinued>>changed to dobutamine. Developed hypotension w/ increase in pressor support. NE and VP added. Empiric abx added for ?HCAP.  Became increasingly tachycardic and dobutamine stopped overnight.  - CRRT stopped 7/23>>iHD MWF  Now with tunneled right IJ catheter. Tolerating HD.   No complaints this morning, no chest pain or dyspnea.      Objective:   Weight Range: 80.7 kg Body mass index is 25.53 kg/m.   Vital Signs:   Temp:  [97.8 F (36.6 C)-98.7 F (37.1 C)] 97.9 F (36.6 C) (07/29 0736) Pulse Rate:  [68-105] 105 (07/29 0359) Resp:  [15-24] 17 (07/29 0736) BP: (84-148)/(51-69) 105/55 (07/29 0736) SpO2:  [95 %-98 %] 98 % (07/29 0359) Weight:  [80 kg-80.7 kg] 80.7 kg (07/29 0359) Last BM Date: 12/31/19  Weight change: Filed Weights   12/31/19 0705 12/31/19 1110 01/01/20 0359  Weight: 83.1 kg 80 kg 80.7 kg    Intake/Output:   Intake/Output Summary (Last 24 hours) at 01/01/2020 0740 Last data filed at 12/31/2019 1930 Gross per 24 hour  Intake 120 ml  Output 3000 ml  Net -2880 ml      Physical Exam   General: NAD Neck: No JVD, no thyromegaly or thyroid nodule.  Lungs: Clear to auscultation bilaterally with  normal respiratory effort. CV: Nondisplaced PMI.  Heart regular S1/S2, no S3/S4, no murmur.  No peripheral edema.   Abdomen: Soft, nontender, no hepatosplenomegaly, no distention.  Skin: Intact without lesions or rashes.  Neurologic: Alert and oriented x 3.  Psych: Normal affect. Extremities: No clubbing or cyanosis.  HEENT: Normal.    Telemetry   NSR 80s with PVCs (personally reviewed)    Labs    CBC Recent Labs    12/31/19 0438 01/01/20 0309  WBC 10.3 8.0  HGB 8.2* 8.4*  HCT 26.7* 27.7*  MCV 84.2 84.2  PLT 249 433   Basic Metabolic Panel Recent Labs    12/31/19 0438 01/01/20 0309  NA 135 135  K 4.8 4.7  CL 99 97*  CO2 25 29  GLUCOSE 93 98  BUN 52* 24*  CREATININE 5.39* 4.15*  CALCIUM 7.5* 7.9*   Liver Function Tests No results for input(s): AST, ALT, ALKPHOS, BILITOT, PROT, ALBUMIN in the last 72 hours. No results for input(s): LIPASE, AMYLASE in the last 72 hours. Cardiac Enzymes No results for input(s): CKTOTAL, CKMB, CKMBINDEX, TROPONINI in the last 72 hours.  BNP: BNP (last 3 results) No results for input(s): BNP in the last 8760 hours.  ProBNP (last 3 results) No results for input(s): PROBNP in the last 8760 hours.   D-Dimer No results for input(s): DDIMER in the last 72 hours. Hemoglobin A1C  No results for input(s): HGBA1C in the last 72 hours. Fasting Lipid Panel No results for input(s): CHOL, HDL, LDLCALC, TRIG, CHOLHDL, LDLDIRECT in the last 72 hours. Thyroid Function Tests No results for input(s): TSH, T4TOTAL, T3FREE, THYROIDAB in the last 72 hours.  Invalid input(s): FREET3  Other results:   Imaging    No results found.   Medications:     Scheduled Medications: . amiodarone  200 mg Oral Daily  . apixaban  5 mg Oral BID  . Apremilast  1 tablet Oral Daily  . aspirin EC  81 mg Oral Daily  . atorvastatin  80 mg Oral Daily  . bisacodyl  10 mg Oral Daily   Or  . bisacodyl  10 mg Rectal Daily  . carvedilol  3.125 mg Oral  BID WC  . chlorhexidine gluconate (MEDLINE KIT)  15 mL Mouth Rinse BID  . Chlorhexidine Gluconate Cloth  6 each Topical Daily  . darbepoetin (ARANESP) injection - DIALYSIS  60 mcg Intravenous Q Wed-HD  . docusate sodium  200 mg Oral Daily  . feeding supplement (ENSURE ENLIVE)  237 mL Oral BID BM  . insulin aspart  0-24 Units Subcutaneous Q4H  . insulin detemir  5 Units Subcutaneous BID  . multivitamin  1 tablet Oral QHS  . pantoprazole  40 mg Oral Daily  . sodium chloride flush  3 mL Intravenous Q12H  . tacrolimus  1 mg Oral BID  . venlafaxine XR  37.5 mg Oral Daily    Infusions: . sodium chloride Stopped (12/21/19 1602)    PRN Medications: Gerhardt's butt cream, HYDROmorphone, insulin aspart, LORazepam, melatonin, metoprolol tartrate, ondansetron (ZOFRAN) IV, senna-docusate, sodium chloride, sodium chloride flush, traMADol    Assessment/Plan   1. CAD: Severe 3VD on cath 7/21 => 60-70% ostial LAD, 80% mLAD, 95% ramus, 99% proximal LCx with TIMI-2 flow down the rest of the LCx, occluded distal RCA.  Suspect out of hospital inferolateral MI.  Cardiac MRI showed that the inferolateral wall is likely not viable, but the remainder of the LV myocardium is likely viable. He had CABG 7/16 with LIMA-LAD, RIMA-ramus, seq SVG-PD/PL, SVG-D. No chest pain.  - Continue ASA + atorvastatin.   - Continue to Mobilize w/ PT - Will need pacing wires out before discharge.  2. Acute on chronic systolic CHF: Echo with EF 20-25%, moderately decreased RV systolic function. Cardiac MRI with LV EF 26%, RV EF 38%, near full thickness scar in inferolateral wall suggesting lack of viability in the LCx territory.  Now post-CABG. Repeat limited echo 7/20 showed LVEF 20-25%, RV normal, no pericardial effusion. Remains oliguric. CVVHD stopped 7/23. Now on iHD, MWF for volume control. Off inotropes/ pressors. Milrinone stopped 7/25.  Now off midodrine.  - Volume management per Renal.  iHD MWF - Continue Coreg 3.125 mg  bid.   - no ARB/ARNI, dig nor spiro w/ renal failure 3. Atrial fibrillation: Patient admitted with afib/RVR. Remains in NSR. S/p Maze + LAA clipping on 7/16.  - Now on amio 200 po daily. - Continue Eliquis.  4. AKI on CKD stage 3: Creatine has been stable 1.9-2.3 range in hospital pre-op.  Was 1.3 back in 4/21. Creatinine rose to 6.46 post-op. Suspect post-op ATN. CRRT initiated 7/19. Remains oliguric. CVVHD stopped 7/23, now tolerating iHD.    - Management per Renal.  5. Type 2 diabetes: SSI.  6. Liver transplant for HCV: Tacrolimus trough 3 on 7/24, continue current dose. Now off stress dose steroids (not on steroids at home  prior).  7. ID: 7/19 developed hypotension w/ increasing pressor support, increasing leukocytosis and CXR w/ hazy opacities. Started on empiric abx for ? HCAP with vancomycin/cefepime => now off. BC NGTD. Leukocytosis resolved. AF.  - no change   Should be ready for home soon, when outpatient HD is worked out.  Will need pacing wires out.   Loralie Champagne 01/01/2020 7:40 AM

## 2020-01-01 NOTE — Progress Notes (Addendum)
[address]       [place] [address]             [phone]      13 Days Post-Op Procedure(s) (LRB): CORONARY ARTERY BYPASS GRAFTING (CABG) TIMES  X 5,   USING BILATERAL MAMMARY ARTERY AND RIGHT LEG GREATER SAPHENOUS VEIN HARVESTED ENDOSCOPICALLY (N/A) TRANSESOPHAGEAL ECHOCARDIOGRAM (TEE) (N/A) INDOCYANINE GREEN FLUORESCENCE IMAGING (ICG) (N/A) MAZE (N/A) CLIPPING OF ATRIAL APPENDAGE USING ATRICURE CLIP SIZE 50MM (N/A) Subjective: conts to feel better  Objective: Vital signs in last 24 hours: Temp:  [97.8 F (36.6 C)-98.7 F (37.1 C)] 98.1 F (36.7 C) (07/29 0359) Pulse Rate:  [68-105] 105 (07/29 0359) Cardiac Rhythm: Normal sinus rhythm (07/29 0708) Resp:  [15-27] 18 (07/29 0359) BP: (84-148)/(51-69) 104/61 (07/29 0359) SpO2:  [95 %-98 %] 98 % (07/29 0359) Weight:  [80 kg-80.7 kg] 80.7 kg (07/29 0359)  Hemodynamic parameters for last 24 hours: CVP:  [8 mmHg] 8 mmHg  Intake/Output from previous day: 07/28 0701 - 07/29 0700 In: 120 [P.O.:120] Out: 3000  Intake/Output this shift: No intake/output data recorded.  General appearance: alert, cooperative and no distress Heart: regular rate and rhythm Lungs: min dim in bases Abdomen: benign Extremities: no edema Wound: incis healing well  Lab Results: Recent Labs    12/31/19 0438 01/01/20 0309  WBC 10.3 8.0  HGB 8.2* 8.4*  HCT 26.7* 27.7*  PLT 249 275   BMET:  Recent Labs    12/31/19 0438 01/01/20 0309  NA 135 135  K 4.8 4.7  CL 99 97*  CO2 25 29  GLUCOSE 93 98  BUN 52* 24*  CREATININE 5.39* 4.15*  CALCIUM 7.5* 7.9*    PT/INR: No results for input(s): LABPROT, INR in the last 72 hours. ABG    Component Value Date/Time   PHART 7.241 (L) 12/22/2019 1411   HCO3 18.3 (L) 12/22/2019 1411   TCO2 20 (L) 12/22/2019 1411   ACIDBASEDEF 8.0 (H) 12/22/2019 1411   O2SAT 53.6 12/31/2019 0430   CBG (last 3)  Recent Labs    12/31/19 2004 12/31/19 2357 01/01/20 0400  GLUCAP 163*  74 84    Meds Scheduled Meds: . amiodarone  200 mg Oral Daily  . apixaban  5 mg Oral BID  . Apremilast  1 tablet Oral Daily  . aspirin EC  81 mg Oral Daily  . atorvastatin  80 mg Oral Daily  . bisacodyl  10 mg Oral Daily   Or  . bisacodyl  10 mg Rectal Daily  . carvedilol  3.125 mg Oral BID WC  . chlorhexidine gluconate (MEDLINE KIT)  15 mL Mouth Rinse BID  . Chlorhexidine Gluconate Cloth  6 each Topical Daily  . darbepoetin (ARANESP) injection - DIALYSIS  60 mcg Intravenous Q Wed-HD  . docusate sodium  200 mg Oral Daily  . feeding supplement (ENSURE ENLIVE)  237 mL Oral BID BM  . insulin aspart  0-24 Units Subcutaneous Q4H  . insulin detemir  5 Units Subcutaneous BID  . multivitamin  1 tablet Oral QHS  . pantoprazole  40 mg Oral Daily  . sodium chloride flush  3 mL Intravenous Q12H  . tacrolimus  1 mg Oral BID  . venlafaxine XR  37.5 mg Oral Daily   Continuous Infusions: . sodium chloride Stopped (12/21/19 1602)   PRN Meds:.Gerhardt's butt cream, HYDROmorphone, insulin aspart, LORazepam, melatonin, metoprolol tartrate, ondansetron (ZOFRAN) IV, senna-docusate, sodium chloride, sodium chloride flush, traMADol  Xrays IR Fluoro  Guide CV Line Right  Result Date: 12/30/2019 CLINICAL DATA:  Acute kidney injury, needs durable venous access for hemodialysis EXAM: TUNNELED HEMODIALYSIS CATHETER PLACEMENT WITH ULTRASOUND AND FLUOROSCOPIC GUIDANCE TECHNIQUE: The procedure, risks, benefits, and alternatives were explained to the patient. Questions regarding the procedure were encouraged and answered. The patient understands and consents to the procedure. Patency of the right IJ vein was confirmed with ultrasound with image documentation. An appropriate skin site was determined. Region was prepped using maximum barrier technique including cap and mask, sterile gown, sterile gloves, large sterile sheet, and Chlorhexidine as cutaneous antisepsis. The region was infiltrated locally with 1%  lidocaine. Intravenous Fentanyl 65mg and Versed 169mwere administered as conscious sedation during continuous monitoring of the patient's level of consciousness and physiological / cardiorespiratory status by the radiology RN, with a total moderate sedation time of 11 minutes. Under real-time ultrasound guidance, the right IJ vein was accessed with a 21 gauge micropuncture needle; the needle tip within the vein was confirmed with ultrasound image documentation. Needle exchanged over the 018 guidewire for transitional dilator, which allowed advancement of a Benson wire into the IVC. Over this, an MPA catheter was advanced. A Palindrome 23 hemodialysis catheter was tunneled from the right anterior chest wall approach to the right IJ dermatotomy site. The MPA catheter was exchanged over an Amplatz wire for serial vascular dilators which allow placement of a peel-away sheath, through which the catheter was advanced under intermittent fluoroscopy, positioned with its tips in the proximal and midright atrium. Spot chest radiograph confirms good catheter position. No pneumothorax. Catheter was flushed and primed per protocol. Catheter secured externally with O Prolene sutures. The right IJ dermatotomy site was closed with Dermabond. COMPLICATIONS: COMPLICATIONS None immediate FLUOROSCOPY TIME:  24 seconds; 2 mGy COMPARISON:  None IMPRESSION: 1. Technically successful placement of tunneled right IJ hemodialysis catheter with ultrasound and fluoroscopic guidance. Ready for routine use. ACCESS: Remains approachable for percutaneous intervention as needed. Electronically Signed   By: D Lucrezia Europe.D.   On: 12/30/2019 13:16   IR USKoreauide Vasc Access Right  Result Date: 12/30/2019 CLINICAL DATA:  Acute kidney injury, needs durable venous access for hemodialysis EXAM: TUNNELED HEMODIALYSIS CATHETER PLACEMENT WITH ULTRASOUND AND FLUOROSCOPIC GUIDANCE TECHNIQUE: The procedure, risks, benefits, and alternatives were explained to  the patient. Questions regarding the procedure were encouraged and answered. The patient understands and consents to the procedure. Patency of the right IJ vein was confirmed with ultrasound with image documentation. An appropriate skin site was determined. Region was prepped using maximum barrier technique including cap and mask, sterile gown, sterile gloves, large sterile sheet, and Chlorhexidine as cutaneous antisepsis. The region was infiltrated locally with 1% lidocaine. Intravenous Fentanyl 5047mand Versed 1mg42mre administered as conscious sedation during continuous monitoring of the patient's level of consciousness and physiological / cardiorespiratory status by the radiology RN, with a total moderate sedation time of 11 minutes. Under real-time ultrasound guidance, the right IJ vein was accessed with a 21 gauge micropuncture needle; the needle tip within the vein was confirmed with ultrasound image documentation. Needle exchanged over the 018 guidewire for transitional dilator, which allowed advancement of a Benson wire into the IVC. Over this, an MPA catheter was advanced. A Palindrome 23 hemodialysis catheter was tunneled from the right anterior chest wall approach to the right IJ dermatotomy site. The MPA catheter was exchanged over an Amplatz wire for serial vascular dilators which allow placement of a peel-away sheath, through which the catheter was advanced under  intermittent fluoroscopy, positioned with its tips in the proximal and midright atrium. Spot chest radiograph confirms good catheter position. No pneumothorax. Catheter was flushed and primed per protocol. Catheter secured externally with O Prolene sutures. The right IJ dermatotomy site was closed with Dermabond. COMPLICATIONS: COMPLICATIONS None immediate FLUOROSCOPY TIME:  24 seconds; 2 mGy COMPARISON:  None IMPRESSION: 1. Technically successful placement of tunneled right IJ hemodialysis catheter with ultrasound and fluoroscopic guidance.  Ready for routine use. ACCESS: Remains approachable for percutaneous intervention as needed. Electronically Signed   By: Lucrezia Europe M.D.   On: 12/30/2019 13:16    Assessment/Plan: S/P Procedure(s) (LRB): CORONARY ARTERY BYPASS GRAFTING (CABG) TIMES  X 5,   USING BILATERAL MAMMARY ARTERY AND RIGHT LEG GREATER SAPHENOUS VEIN HARVESTED ENDOSCOPICALLY (N/A) TRANSESOPHAGEAL ECHOCARDIOGRAM (TEE) (N/A) INDOCYANINE GREEN FLUORESCENCE IMAGING (ICG) (N/A) MAZE (N/A) CLIPPING OF ATRIAL APPENDAGE USING ATRICURE CLIP SIZE 50MM (N/A)  1 afeb , VSS, SBP variable 80'2 to 140's , coreg added yesterday . AHF team managing CHF meds. 2 tolerating dialysis- as per nephrology , navigator to arrange OP HD sessions 3 H/H a little higher, no leukocytosis or thrombocytopenia 4 glucose well controlled 5 needs epw's out will d/w Dr Orvan Seen if we need to hold eliquis 6 TOC has been consulted for discharge planning 7 should be stable for d/c soon if all agree  LOS: 20 days    John Giovanni PA-C Pager [phone] 01/01/2020 Pt seen and examined; agree with documentation. Pacing wires removed at bedside without difficulty. May be able to discharge tomorrow after HD. Damaria Stofko Z. Orvan Seen, [place]

## 2020-01-02 DIAGNOSIS — N189 Chronic kidney disease, unspecified: Secondary | ICD-10-CM | POA: Diagnosis not present

## 2020-01-02 DIAGNOSIS — K219 Gastro-esophageal reflux disease without esophagitis: Secondary | ICD-10-CM | POA: Diagnosis not present

## 2020-01-02 DIAGNOSIS — D649 Anemia, unspecified: Secondary | ICD-10-CM | POA: Diagnosis not present

## 2020-01-02 DIAGNOSIS — I10 Essential (primary) hypertension: Secondary | ICD-10-CM | POA: Diagnosis not present

## 2020-01-02 DIAGNOSIS — N179 Acute kidney failure, unspecified: Secondary | ICD-10-CM | POA: Diagnosis not present

## 2020-01-02 DIAGNOSIS — R69 Illness, unspecified: Secondary | ICD-10-CM | POA: Diagnosis not present

## 2020-01-03 DIAGNOSIS — Z992 Dependence on renal dialysis: Secondary | ICD-10-CM | POA: Diagnosis not present

## 2020-01-03 DIAGNOSIS — N186 End stage renal disease: Secondary | ICD-10-CM | POA: Diagnosis not present

## 2020-01-05 ENCOUNTER — Encounter: Payer: Self-pay | Admitting: *Deleted

## 2020-01-05 ENCOUNTER — Other Ambulatory Visit: Payer: Self-pay | Admitting: *Deleted

## 2020-01-05 DIAGNOSIS — D849 Immunodeficiency, unspecified: Secondary | ICD-10-CM | POA: Diagnosis not present

## 2020-01-05 DIAGNOSIS — N179 Acute kidney failure, unspecified: Secondary | ICD-10-CM | POA: Diagnosis not present

## 2020-01-05 DIAGNOSIS — N189 Chronic kidney disease, unspecified: Secondary | ICD-10-CM | POA: Diagnosis not present

## 2020-01-05 DIAGNOSIS — Z23 Encounter for immunization: Secondary | ICD-10-CM | POA: Diagnosis not present

## 2020-01-05 DIAGNOSIS — D649 Anemia, unspecified: Secondary | ICD-10-CM | POA: Diagnosis not present

## 2020-01-05 DIAGNOSIS — B998 Other infectious disease: Secondary | ICD-10-CM | POA: Diagnosis not present

## 2020-01-05 NOTE — Patient Outreach (Signed)
[facility]) Care Management  01/05/2020  Blaze Nylund Lowery [facility] 11-Mar-1956 [id number]   EMMI-general discharge RED ON EMMI ALERT Day # 1 Date: Saturday 01/03/20 1300 Red Alert Reason: Other questions/problems? Yes  Insurance:  [employer] medicare  [facility] admissions x 1 ED visits x 1 in the last 6 months    Outreach attempt # 1 successful to the home number  Patient is able to verify HIPAA, DOB and address [place] Management RN reviewed and addressed red alert with patient   EMMI:  Mr Klatt confirms the EMMI response is correct  He noted some bleeding of his Hemodialysis (HD) catheter site He reports on today it was examined by the HD staff and found to be "okay"/without issues   Advised patient that there will be further automated EMMI- post discharge calls to assess how the patient is doing following the recent hospitalization Advised the patient that another call may be received from a nurse if any of their responses were abnormal. Patient voiced understanding and was appreciative of f/u call.   Plan: [facility] RN CM will follow up with Mr Rishel within the next 76 business days  [facility] RN CM sent a [employer] with [employer] brochure, [employer], [facility] consent form with return envelope and know before you go sheet enclosed for review MD involvement barriers letter sent  Pt encouraged to return a call to [facility] RN CM prn   Joesphine Schemm L. Lavina Hamman, RN, BSN, [place] Coordinator Office number [phone] Mobile number [phone]  Main [facility] number [id number] Fax number [phone]

## 2020-01-06 ENCOUNTER — Ambulatory Visit (HOSPITAL_COMMUNITY)
Admit: 2020-01-06 | Discharge: 2020-01-06 | Disposition: A | Discharge status: Home / Self Care | Payer: Medicare HMO | Source: Ambulatory Visit | Attending: Cardiology | Admitting: Cardiology

## 2020-01-06 ENCOUNTER — Other Ambulatory Visit: Payer: Self-pay

## 2020-01-06 ENCOUNTER — Encounter (HOSPITAL_COMMUNITY): Payer: Self-pay

## 2020-01-06 VITALS — BP 102/66 | HR 70 | Wt 177.2 lb

## 2020-01-06 DIAGNOSIS — F329 Major depressive disorder, single episode, unspecified: Secondary | ICD-10-CM | POA: Diagnosis not present

## 2020-01-06 DIAGNOSIS — I5082 Biventricular heart failure: Secondary | ICD-10-CM | POA: Insufficient documentation

## 2020-01-06 DIAGNOSIS — Z951 Presence of aortocoronary bypass graft: Secondary | ICD-10-CM | POA: Insufficient documentation

## 2020-01-06 DIAGNOSIS — Z8249 Family history of ischemic heart disease and other diseases of the circulatory system: Secondary | ICD-10-CM | POA: Insufficient documentation

## 2020-01-06 DIAGNOSIS — Z992 Dependence on renal dialysis: Secondary | ICD-10-CM | POA: Diagnosis not present

## 2020-01-06 DIAGNOSIS — Z944 Liver transplant status: Secondary | ICD-10-CM | POA: Insufficient documentation

## 2020-01-06 DIAGNOSIS — R69 Illness, unspecified: Secondary | ICD-10-CM | POA: Diagnosis not present

## 2020-01-06 DIAGNOSIS — E785 Hyperlipidemia, unspecified: Secondary | ICD-10-CM | POA: Insufficient documentation

## 2020-01-06 DIAGNOSIS — F419 Anxiety disorder, unspecified: Secondary | ICD-10-CM | POA: Diagnosis not present

## 2020-01-06 DIAGNOSIS — B182 Chronic viral hepatitis C: Secondary | ICD-10-CM | POA: Diagnosis not present

## 2020-01-06 DIAGNOSIS — I252 Old myocardial infarction: Secondary | ICD-10-CM | POA: Diagnosis not present

## 2020-01-06 DIAGNOSIS — I2510 Atherosclerotic heart disease of native coronary artery without angina pectoris: Secondary | ICD-10-CM | POA: Diagnosis not present

## 2020-01-06 DIAGNOSIS — N179 Acute kidney failure, unspecified: Secondary | ICD-10-CM | POA: Diagnosis not present

## 2020-01-06 DIAGNOSIS — Z79899 Other long term (current) drug therapy: Secondary | ICD-10-CM | POA: Diagnosis not present

## 2020-01-06 DIAGNOSIS — I5022 Chronic systolic (congestive) heart failure: Secondary | ICD-10-CM | POA: Diagnosis not present

## 2020-01-06 DIAGNOSIS — N183 Chronic kidney disease, stage 3 unspecified: Secondary | ICD-10-CM | POA: Insufficient documentation

## 2020-01-06 DIAGNOSIS — I130 Hypertensive heart and chronic kidney disease with heart failure and stage 1 through stage 4 chronic kidney disease, or unspecified chronic kidney disease: Secondary | ICD-10-CM | POA: Insufficient documentation

## 2020-01-06 DIAGNOSIS — Z87891 Personal history of nicotine dependence: Secondary | ICD-10-CM | POA: Diagnosis not present

## 2020-01-06 DIAGNOSIS — E1122 Type 2 diabetes mellitus with diabetic chronic kidney disease: Secondary | ICD-10-CM | POA: Diagnosis not present

## 2020-01-06 DIAGNOSIS — Z7901 Long term (current) use of anticoagulants: Secondary | ICD-10-CM | POA: Insufficient documentation

## 2020-01-06 DIAGNOSIS — I4891 Unspecified atrial fibrillation: Secondary | ICD-10-CM | POA: Diagnosis not present

## 2020-01-06 NOTE — Progress Notes (Signed)
Advanced Heart Failure Clinic Note   Referring Physician: PCP: Monico Blitz, MD PCP-Cardiologist: Candee Furbish, MD  [facility]: Dr. [name]   Reason for Visit: [facility] F/u for Systolic Heart Failure   HPI:  64 y/o male w/ h/o Hep C s/p liver transplant in 2009(followed at [facility]), Stage III CKD, HTN and T2DM, recently admitted 7/21 w/ NSTEMI and Afib w/ RVR, found to have severe 3VD and biventricular heart failure, LVEF 20-25%, RV moderately reduced. Underwent CABG + MAZE + LA appendage clipping 7/16. Required milrinone post operatively. Post op course b/c ATN/ renal failure. Required CVVHD but remained anuric. Tunneled HD cath placed and transitioned to iHD, MWF.   He presents to clinic today for post hospital f/u. Doing well. Continues w/ HD MWF. Volume status stable. Denies dyspnea. No CP. Tolerating meds well w/o SEs. BP soft 102/66 but no  hypotension w/ HD. No orthostatic symptoms.   He reports he had labs drawn yesterday w/ [facility]plant team to recheck Tacrolimus. No results yet.     Review of Systems: [y] = yes, [ ]  = no   General: Weight gain [ ] ; Weight loss [ ] ; Anorexia [ ] ; Fatigue [ ] ; Fever [ ] ; Chills [ ] ; Weakness [ ]   Cardiac: Chest pain/pressure [ ] ; Resting SOB [ ] ; Exertional SOB [ ] ; Orthopnea [ ] ; Pedal Edema [ ] ; Palpitations [ ] ; Syncope [ ] ; Presyncope [ ] ; Paroxysmal nocturnal dyspnea[ ]   Pulmonary: Cough [ ] ; Wheezing[ ] ; Hemoptysis[ ] ; Sputum [ ] ; Snoring [ ]   GI: Vomiting[ ] ; Dysphagia[ ] ; Melena[ ] ; Hematochezia [ ] ; Heartburn[ ] ; Abdominal pain [ ] ; Constipation [ ] ; Diarrhea [ ] ; BRBPR [ ]   GU: Hematuria[ ] ; Dysuria [ ] ; Nocturia[ ]   Vascular: Pain in legs with walking [ ] ; Pain in feet with lying flat [ ] ; Non-healing sores [ ] ; Stroke [ ] ; TIA [ ] ; Slurred speech [ ] ;  Neuro: Headaches[ ] ; Vertigo[ ] ; Seizures[ ] ; Paresthesias[ ] ;Blurred vision [ ] ; Diplopia [ ] ; Vision changes [ ]   Ortho/Skin: Arthritis [ ] ; Joint pain [ ] ; Muscle pain [ ] ; Joint  swelling [ ] ; Back Pain [ ] ; Rash [ ]   Psych: Depression[ ] ; Anxiety[ ]   Heme: Bleeding problems [ ] ; Clotting disorders [ ] ; Anemia [ ]   Endocrine: Diabetes [ ] ; Thyroid dysfunction[ ]    Past Medical History:  Diagnosis Date  . Anxiety   . CKD (chronic kidney disease), stage III   . Depression   . Diabetes mellitus type 2, noninsulin dependent ([place])   . Hepatitis C   . Hypertension   . Psoriasis   . Status post liver transplant [facility])     Current Outpatient Medications  Medication Sig Dispense Refill  . amiodarone (PACERONE) 200 MG tablet Take 1 tablet (200 mg total) by mouth daily. 30 tablet 1  . apixaban (ELIQUIS) 5 MG TABS tablet Take 1 tablet (5 mg total) by mouth 2 (two) times daily. 60 tablet 2  . Apremilast (OTEZLA) 30 MG TABS Take 1 tablet by mouth 2 (two) times daily.    Marland Kitchen atorvastatin (LIPITOR) 80 MG tablet Take 1 tablet (80 mg total) by mouth daily. 30 tablet 1  . carvedilol (COREG) 3.125 MG tablet Take 1 tablet (3.125 mg total) by mouth 2 (two) times daily with a meal. 60 tablet 1  . [START ON 01/07/2020] Darbepoetin Alfa (ARANESP) 60 MCG/0.3ML SOSY injection Inject 0.3 mLs (60 mcg total) into the vein every Wednesday with hemodialysis. 4.2 mL   .  LORazepam (ATIVAN) 1 MG tablet Take 1 mg by mouth 2 (two) times daily as needed (panic attacks).     . multivitamin (RENA-VIT) TABS tablet Take 1 tablet by mouth at bedtime. 30 tablet 2  . mycophenolate (CELLCEPT) 250 MG capsule Take 250 mg by mouth 2 (two) times daily.     . pantoprazole (PROTONIX) 40 MG tablet Take 1 tablet (40 mg total) by mouth daily. 30 tablet 1  . tacrolimus (PROGRAF) 1 MG capsule Take 1 capsule (1 mg total) by mouth 2 (two) times daily. 60 capsule 1  . traMADol (ULTRAM) 50 MG tablet Take 1 tablet (50 mg total) by mouth every 6 (six) hours as needed for up to 7 days for moderate pain. 28 tablet 0  . venlafaxine XR (EFFEXOR-XR) 75 MG 24 hr capsule Take 75 mg by mouth daily.    Marland Kitchen aspirin EC 81 MG EC tablet  Take 1 tablet (81 mg total) by mouth daily. Swallow whole. (Patient not taking: Reported on 01/06/2020)     No current facility-administered medications for this encounter.    No Known Allergies    Social History   Socioeconomic History  . Marital status: Single    Spouse name: Not on file  . Number of children: Not on file  . Years of education: Not on file  . Highest education level: Not on file  Occupational History  . Not on file  Tobacco Use  . Smoking status: Former Smoker    Packs/day: 1.00    Types: Cigarettes    Quit date: 09/04/2007    Years since quitting: 12.3  . Smokeless tobacco: Never Used  Vaping Use  . Vaping Use: Never used  Substance and Sexual Activity  . Alcohol use: No  . Drug use: No  . Sexual activity: Yes    Birth control/protection: None  Other Topics Concern  . Not on file  Social History Narrative  . Not on file   Social Determinants of Health   Financial Resource Strain:   . Difficulty of Paying Living Expenses:   Food Insecurity: No Food Insecurity  . Worried About Charity fundraiser in the Last Year: Never true  . Ran Out of Food in the Last Year: Never true  Transportation Needs: No Transportation Needs  . Lack of Transportation (Medical): No  . Lack of Transportation (Non-Medical): No  Physical Activity:   . Days of Exercise per Week:   . Minutes of Exercise per Session:   Stress:   . Feeling of Stress :   Social Connections:   . Frequency of Communication with Friends and Family:   . Frequency of Social Gatherings with Friends and Family:   . Attends Religious Services:   . Active Member of Clubs or Organizations:   . Attends Archivist Meetings:   Marland Kitchen Marital Status:   Intimate Partner Violence:   . Fear of Current or Ex-Partner:   . Emotionally Abused:   Marland Kitchen Physically Abused:   . Sexually Abused:       Family History  Problem Relation Age of Onset  . Diabetes Mother   . Heart disease Father   . Heart  disease Son     Vitals:   01/06/20 1143  BP: 102/66  Pulse: 70  SpO2: 97%  Weight: 80.4 kg (177 lb 3.2 oz)     PHYSICAL EXAM: General:  Well appearing, ambulating w/ walker. No respiratory difficulty HEENT: normal Neck: supple. no JVD. Carotids 2+ bilat; no  bruits. No lymphadenopathy or thyromegaly appreciated. Cor: PMI nondisplaced. Regular rate & rhythm. No rubs, gallops or murmurs. Lungs: clear Abdomen: soft, nontender, nondistended. No hepatosplenomegaly. No bruits or masses. Good bowel sounds. Extremities: no cyanosis, clubbing, rash, edema Neuro: alert & oriented x 3, cranial nerves grossly intact. moves all 4 extremities w/o difficulty. Affect pleasant.  ECG: NSR QT/QTc 426/452 ms   ASSESSMENT & PLAN:  1. CAD: Severe 3VD on cath 7/21 =>60-70% ostial LAD, 80% mLAD, 95% ramus, 99% proximal LCx with TIMI-2 flow down the rest of the LCx, occluded distal RCA. Suspect out of hospital inferolateral MI. Cardiac MRI showed that the inferolateral wall is likely not viable, but the remainder of the LV myocardium is likely viable. He had CABG 7/16 with LIMA-LAD, RIMA-ramus, seq SVG-PD/PL, SVG-D.  - Stable w/o anginal symptoms  - Continue ASA 81 mg daily  - Continue atorvastatin 80 mg qhs. LDL goal <70.  2. Chronic Systolic CHF: Echo 8/09 with EF 20-25%, moderately decreased RV systolic function. Cardiac MRI with LV EF 26%, RV EF 38%, near full thickness scar in inferolateral wall suggesting lack of viability in the LCx territory.  Now post-CABG. Repeat limited echo 7/20 showed LVEF 20-25%, RV normal, no pericardial effusion.  - NYHA II-III - Euvolemic on exam. Volume controlled through HD (MWF) - Continue Coreg 3.125 mg bid.  BP too soft to titrate  - no ARB/ARNI, dig nor spiro w/ renal failure 3. Atrial fibrillation:  S/p Maze + LAA clipping on 7/16.  - Now on amio 200 po daily. EKG today shows NSR - if no recurrence, may be able to d/c amio at next f/u visit  - Continue Eliquis  5 mg bid. Denies abnormal bleeding  4. AKI on CKD stage 3: Prior baseline SCr ~1.3. Creatinine rose to 6.46 post CABG. Suspect post-op ATN. CRRT initiated 7/19>>transitioned to [facility], now MWF. He reports he is making some urine. Hoping for renal recovery. Ongoing assessment per nephrology.  - labs followed at HD 5. Type 2 diabetes:  - well controlled. Most recent Hgb A1c 5.9  - followed by PCP  6. Liver transplant for HCV:  - Followed at [facility]. On Tacrolimus (dose reduced recent admission).  - had repeat Tacrolimus level drawn yesterday, per pt report. Results pending.  7. HLD w/ LDL goal < 70 (CAD) - LDL elevated at 158 mg/dL - now on atorvastatin 80 mg daily   - Check repeat lipid panel and HFTs in 4-6 more weeks   F/u in 4-6 more weeks.    Lyda Jester, PA-C 01/06/20

## 2020-01-06 NOTE — Patient Instructions (Signed)
It was great to see you today! No medication changes are needed at this time.   Your physician recommends that you schedule a follow-up appointment in: 6 weeks  in the Advanced Practitioners (PA/NP) Clinic  Or with Dr [name]  Do the following things EVERYDAY: 1) Weigh yourself in the morning before breakfast. Write it down and keep it in a log. 2) Take your medicines as prescribed 3) Eat low salt foods--Limit salt (sodium) to 2000 mg per day.  4) Stay as active as you can everyday 5) Limit all fluids for the day to less than 2 liters  If you have any questions or concerns before your next appointment please send [place] a message through [place] or call our office at [phone].    TO LEAVE A MESSAGE FOR THE NURSE SELECT OPTION 2, PLEASE LEAVE A MESSAGE INCLUDING: . YOUR NAME . DATE OF BIRTH . CALL BACK NUMBER . REASON FOR CALL**this is important as we prioritize the call backs  YOU WILL RECEIVE A CALL BACK THE SAME DAY AS LONG AS YOU CALL BEFORE 4:00 PM

## 2020-01-07 DIAGNOSIS — N189 Chronic kidney disease, unspecified: Secondary | ICD-10-CM | POA: Diagnosis not present

## 2020-01-07 DIAGNOSIS — N179 Acute kidney failure, unspecified: Secondary | ICD-10-CM | POA: Diagnosis not present

## 2020-01-07 DIAGNOSIS — Z23 Encounter for immunization: Secondary | ICD-10-CM | POA: Diagnosis not present

## 2020-01-08 DIAGNOSIS — I25738 Atherosclerosis of nonautologous biological coronary artery bypass graft(s) with other forms of angina pectoris: Secondary | ICD-10-CM | POA: Diagnosis not present

## 2020-01-08 DIAGNOSIS — I130 Hypertensive heart and chronic kidney disease with heart failure and stage 1 through stage 4 chronic kidney disease, or unspecified chronic kidney disease: Secondary | ICD-10-CM | POA: Diagnosis not present

## 2020-01-08 DIAGNOSIS — I5082 Biventricular heart failure: Secondary | ICD-10-CM | POA: Diagnosis not present

## 2020-01-08 DIAGNOSIS — Z6827 Body mass index (BMI) 27.0-27.9, adult: Secondary | ICD-10-CM | POA: Diagnosis not present

## 2020-01-08 DIAGNOSIS — K7460 Unspecified cirrhosis of liver: Secondary | ICD-10-CM | POA: Diagnosis not present

## 2020-01-08 DIAGNOSIS — I2510 Atherosclerotic heart disease of native coronary artery without angina pectoris: Secondary | ICD-10-CM | POA: Diagnosis not present

## 2020-01-08 DIAGNOSIS — N1831 Chronic kidney disease, stage 3a: Secondary | ICD-10-CM | POA: Diagnosis not present

## 2020-01-08 DIAGNOSIS — I5023 Acute on chronic systolic (congestive) heart failure: Secondary | ICD-10-CM | POA: Diagnosis not present

## 2020-01-08 DIAGNOSIS — I10 Essential (primary) hypertension: Secondary | ICD-10-CM | POA: Diagnosis not present

## 2020-01-08 DIAGNOSIS — R69 Illness, unspecified: Secondary | ICD-10-CM | POA: Diagnosis not present

## 2020-01-08 DIAGNOSIS — Z48812 Encounter for surgical aftercare following surgery on the circulatory system: Secondary | ICD-10-CM | POA: Diagnosis not present

## 2020-01-08 DIAGNOSIS — Z299 Encounter for prophylactic measures, unspecified: Secondary | ICD-10-CM | POA: Diagnosis not present

## 2020-01-08 DIAGNOSIS — Z992 Dependence on renal dialysis: Secondary | ICD-10-CM | POA: Diagnosis not present

## 2020-01-08 DIAGNOSIS — I480 Paroxysmal atrial fibrillation: Secondary | ICD-10-CM | POA: Diagnosis not present

## 2020-01-08 DIAGNOSIS — E1122 Type 2 diabetes mellitus with diabetic chronic kidney disease: Secondary | ICD-10-CM | POA: Diagnosis not present

## 2020-01-09 DIAGNOSIS — N189 Chronic kidney disease, unspecified: Secondary | ICD-10-CM | POA: Diagnosis not present

## 2020-01-09 DIAGNOSIS — N179 Acute kidney failure, unspecified: Secondary | ICD-10-CM | POA: Diagnosis not present

## 2020-01-09 DIAGNOSIS — Z23 Encounter for immunization: Secondary | ICD-10-CM | POA: Diagnosis not present

## 2020-01-12 ENCOUNTER — Ambulatory Visit: Payer: Medicare HMO | Admitting: Cardiothoracic Surgery

## 2020-01-12 DIAGNOSIS — N2581 Secondary hyperparathyroidism of renal origin: Secondary | ICD-10-CM | POA: Diagnosis not present

## 2020-01-12 DIAGNOSIS — D649 Anemia, unspecified: Secondary | ICD-10-CM | POA: Diagnosis not present

## 2020-01-12 DIAGNOSIS — Z23 Encounter for immunization: Secondary | ICD-10-CM | POA: Diagnosis not present

## 2020-01-12 DIAGNOSIS — N189 Chronic kidney disease, unspecified: Secondary | ICD-10-CM | POA: Diagnosis not present

## 2020-01-12 DIAGNOSIS — N179 Acute kidney failure, unspecified: Secondary | ICD-10-CM | POA: Diagnosis not present

## 2020-01-14 ENCOUNTER — Other Ambulatory Visit: Payer: Self-pay | Admitting: Cardiothoracic Surgery

## 2020-01-14 DIAGNOSIS — N189 Chronic kidney disease, unspecified: Secondary | ICD-10-CM | POA: Diagnosis not present

## 2020-01-14 DIAGNOSIS — N179 Acute kidney failure, unspecified: Secondary | ICD-10-CM | POA: Diagnosis not present

## 2020-01-14 DIAGNOSIS — Z23 Encounter for immunization: Secondary | ICD-10-CM | POA: Diagnosis not present

## 2020-01-14 DIAGNOSIS — Z951 Presence of aortocoronary bypass graft: Secondary | ICD-10-CM

## 2020-01-15 ENCOUNTER — Other Ambulatory Visit: Payer: Self-pay

## 2020-01-15 ENCOUNTER — Ambulatory Visit (INDEPENDENT_AMBULATORY_CARE_PROVIDER_SITE_OTHER): Payer: Self-pay | Admitting: Physician Assistant

## 2020-01-15 ENCOUNTER — Ambulatory Visit
Admission: RE | Admit: 2020-01-15 | Discharge: 2020-01-15 | Disposition: A | Discharge status: Home / Self Care | Payer: Medicare HMO | Source: Ambulatory Visit | Attending: Cardiothoracic Surgery | Admitting: Cardiothoracic Surgery

## 2020-01-15 VITALS — BP 110/70 | HR 77 | Temp 97.7°F | Resp 20 | Ht 70.0 in | Wt 176.0 lb

## 2020-01-15 DIAGNOSIS — Z951 Presence of aortocoronary bypass graft: Secondary | ICD-10-CM

## 2020-01-15 DIAGNOSIS — I2510 Atherosclerotic heart disease of native coronary artery without angina pectoris: Secondary | ICD-10-CM | POA: Diagnosis not present

## 2020-01-15 DIAGNOSIS — I130 Hypertensive heart and chronic kidney disease with heart failure and stage 1 through stage 4 chronic kidney disease, or unspecified chronic kidney disease: Secondary | ICD-10-CM | POA: Diagnosis not present

## 2020-01-15 DIAGNOSIS — N1831 Chronic kidney disease, stage 3a: Secondary | ICD-10-CM | POA: Diagnosis not present

## 2020-01-15 DIAGNOSIS — I5082 Biventricular heart failure: Secondary | ICD-10-CM | POA: Diagnosis not present

## 2020-01-15 DIAGNOSIS — R69 Illness, unspecified: Secondary | ICD-10-CM | POA: Diagnosis not present

## 2020-01-15 DIAGNOSIS — E1122 Type 2 diabetes mellitus with diabetic chronic kidney disease: Secondary | ICD-10-CM | POA: Diagnosis not present

## 2020-01-15 DIAGNOSIS — I5023 Acute on chronic systolic (congestive) heart failure: Secondary | ICD-10-CM | POA: Diagnosis not present

## 2020-01-15 DIAGNOSIS — I480 Paroxysmal atrial fibrillation: Secondary | ICD-10-CM | POA: Diagnosis not present

## 2020-01-15 DIAGNOSIS — M47814 Spondylosis without myelopathy or radiculopathy, thoracic region: Secondary | ICD-10-CM | POA: Diagnosis not present

## 2020-01-15 DIAGNOSIS — I517 Cardiomegaly: Secondary | ICD-10-CM | POA: Diagnosis not present

## 2020-01-15 DIAGNOSIS — Z48812 Encounter for surgical aftercare following surgery on the circulatory system: Secondary | ICD-10-CM | POA: Diagnosis not present

## 2020-01-15 NOTE — Progress Notes (Signed)
HPI:  Patient returns for routine postoperative follow-up having undergone CABG x 5 on 12/19/2019.   The patient's early postoperative recovery while in the hospital was notable for development of AKI requiring dialysis and Atrial fibrillation requiring anticoagulation. Since hospital discharge the patient reports he is feeling pretty good.  He is ambulating without difficulty.  He does experience some dizziness with ambulation and notices this is worse after dialysis.  He Tacrolimus level has been low since hospital discharge and this has been followed by [facility].  He is hoping to stop some of his medications and he states Monday he is meeting with a doctor and is hopeful he may be able to stop dialysis at some point.  Current Outpatient Medications  Medication Sig Dispense Refill  . amiodarone (PACERONE) 200 MG tablet Take 1 tablet (200 mg total) by mouth daily. 30 tablet 1  . apixaban (ELIQUIS) 5 MG TABS tablet Take 1 tablet (5 mg total) by mouth 2 (two) times daily. 60 tablet 2  . Apremilast (OTEZLA) 30 MG TABS Take 1 tablet by mouth 2 (two) times daily.    Marland Kitchen aspirin EC 81 MG EC tablet Take 1 tablet (81 mg total) by mouth daily. Swallow whole.    Marland Kitchen atorvastatin (LIPITOR) 80 MG tablet Take 1 tablet (80 mg total) by mouth daily. 30 tablet 1  . carvedilol (COREG) 3.125 MG tablet Take 1 tablet (3.125 mg total) by mouth 2 (two) times daily with a meal. 60 tablet 1  . Darbepoetin Alfa (ARANESP) 60 MCG/0.3ML SOSY injection Inject 0.3 mLs (60 mcg total) into the vein every Wednesday with hemodialysis. 4.2 mL   . LORazepam (ATIVAN) 1 MG tablet Take 1 mg by mouth 2 (two) times daily as needed (panic attacks).     . multivitamin (RENA-VIT) TABS tablet Take 1 tablet by mouth at bedtime. 30 tablet 2  . pantoprazole (PROTONIX) 40 MG tablet Take 1 tablet (40 mg total) by mouth daily. 30 tablet 1  . tacrolimus (PROGRAF) 1 MG capsule Take 1 capsule (1 mg total) by mouth 2 (two) times daily. 60 capsule 1  .  venlafaxine XR (EFFEXOR-XR) 75 MG 24 hr capsule Take 75 mg by mouth daily.    . mycophenolate (CELLCEPT) 250 MG capsule Take 250 mg by mouth 2 (two) times daily.  (Patient not taking: Reported on 01/15/2020)     No current facility-administered medications for this visit.    Physical Exam:  BP 110/70   Pulse 77   Temp 97.7 F (36.5 C) (Skin)   Resp 20   Ht 5\' 10"  (1.778 m)   Wt 176 lb (79.8 kg)   SpO2 96% Comment: RA  BMI 25.25 kg/m   Gen: no apparent distress Heart: RRR Lungs: CTA bilaterally Ext: no edema Incisions: C/D/I no evidence of infection  Diagnostic Tests:  CXR; no significant pleural effusion, + cardiomegaly, dialysis catheter in place  A/P:  1. S/P CABG x 5- patient is doing very well.  He has been maintaining NSR and per Cardiology note he may be able to stop Amiodarone at his next visit 2. AKI- on HD MWF, patient is tolerating without difficulty but remains hopeful this can be stopped in the future 3. H/O Liver Transplant- patient on Tacrolimus- [facility] following 4. Activity- increase as tolerated, okay to resume driving, plan to start cardiac rehab soon 5. RTC in 1 month with Dr. Lenard Galloway, PA-C [facility] [phone]

## 2020-01-16 DIAGNOSIS — N179 Acute kidney failure, unspecified: Secondary | ICD-10-CM | POA: Diagnosis not present

## 2020-01-16 DIAGNOSIS — Z23 Encounter for immunization: Secondary | ICD-10-CM | POA: Diagnosis not present

## 2020-01-16 DIAGNOSIS — N189 Chronic kidney disease, unspecified: Secondary | ICD-10-CM | POA: Diagnosis not present

## 2020-01-18 NOTE — Progress Notes (Signed)
Cardiology Office Note  Date: 01/19/2020   ID: Joseph Greer, Joseph Greer Jul 09, 1955, MRN [id number]  PCP:  Monico Blitz, MD  Cardiologist:  Candee Furbish, MD Electrophysiologist:  None   Chief Complaint: Hospital follow up NSTEMI  History of Present Illness: Joseph Greer is a 64 y.o. male with a history of liver transplant 2009 followed at [facility], stage III CKD, hep C, HTN, type II DM. CAD, Acute systolic HF   He had been transferred to [facility] after originally presenting to the emergency department at [facility] on 12/09/2019 where he was complaining of shortness of breath and fatigue.  He was noted to be in atrial fibrillation with RVR.  He was transferred to [facility] for further evaluation and cardiac catheterization.  Catheterization showed severe three-vessel disease.  Echocardiogram showed EF to be severely depressed with severe hypokinesis of all segments except for septum.  EF was estimated 20 to 25%.  Mild to moderate MR.  CVTS consultation was requested.  On 12/19/2019 he was taken to the operating room where he underwent a bilateral maze procedure with RF ablation and cryoablation.  Underwent closure of PFO and CABG x5. CABG 7/16 with LIMA-LAD, RIMA-ramus, seq SVG-PD/PL, SVG-D.  Postoperative course was complicated by the need for multiple vasoactive infusions, treatment with amiodarone for atrial fibrillation and eventually converted to normal sinus rhythm.  Underwent CRRT and subsequent transition to hemodialysis.   Treated postoperative for HAP with vancomycin and cefepime due to findings on chest x-ray and increased leukocytosis.  Dialysis catheter was placed and arrangements were made for outpatient dialysis.  He presented to advanced heart failure clinic on 01/06/2020 and saw [place] PA-C.  He was doing well continuing with hemodialysis on Mondays, Wednesdays, and Fridays.  He is here today for hospital follow-up.  He denies any anginal or exertional symptoms,  palpitations or arrhythmias, orthostatic symptoms, stroke or TIA-like symptoms, dyspeptic symptoms, or blood in stool or urine.  Denies any claudication-like symptoms, DVT or PE-like symptoms or lower extremity edema no PND or orthopnea.  Median sternotomy scar and chest tube insertion sites are healing well.  Right graft harvesting site healing well.  He is tolerating dialysis well.  He has a permacath in his right upper chest.  States he started driving his car without issue.  He is interested in cardiac rehab.  He is not sure when he can be released to participate in cardiac rehab.  He is following at advanced heart failure clinic.  Past Medical History:  Diagnosis Date  . Anxiety   . CKD (chronic kidney disease), stage III   . Depression   . Diabetes mellitus type 2, noninsulin dependent ([place])   . Hepatitis C   . Hypertension   . Psoriasis   . Status post liver transplant [facility])     Past Surgical History:  Procedure Laterality Date  . CLIPPING OF ATRIAL APPENDAGE N/A 12/19/2019   Procedure: CLIPPING OF ATRIAL APPENDAGE USING ATRICURE CLIP SIZE 50MM;  Surgeon: Wonda Olds, MD;  Location: [place];  Service: Open Heart Surgery;  Laterality: N/A;  . COLONOSCOPY    . COLONOSCOPY WITH PROPOFOL N/A 06/01/2017   Procedure: COLONOSCOPY WITH PROPOFOL;  Surgeon: Rogene Houston, MD;  [facility];  Service: Endoscopy;  Laterality: N/A;  7:30  . CORONARY ARTERY BYPASS GRAFT N/A 12/19/2019   Procedure: CORONARY ARTERY BYPASS GRAFTING (CABG) TIMES  X 5,   USING BILATERAL MAMMARY ARTERY AND RIGHT LEG GREATER SAPHENOUS VEIN  HARVESTED ENDOSCOPICALLY;  Surgeon: Wonda Olds, MD;  Location: [place];  Service: Open Heart Surgery;  Laterality: N/A;  . HERNIA REPAIR Right   . IR FLUORO GUIDE CV LINE RIGHT  12/30/2019  . IR US GUIDE VASC ACCESS RIGHT  12/30/2019  . LEFT HEART CATH AND CORONARY ANGIOGRAPHY N/A 12/12/2019   Procedure: LEFT HEART CATH AND CORONARY ANGIOGRAPHY;  Surgeon: Jettie Booze, MD;  Location: [place] CV LAB;  Service: Cardiovascular;  Laterality: N/A;  . LIVER BIOPSY    . LIVER SURGERY    . MAZE N/A 12/19/2019   Procedure: MAZE;  Surgeon: Wonda Olds, MD;  Location: [place];  Service: Open Heart Surgery;  Laterality: N/A;  . POLYPECTOMY  06/01/2017   Procedure: POLYPECTOMY;  Surgeon: Rogene Houston, MD;  [facility];  Service: Endoscopy;;  polyp at sigmoid colon x2, ascending colon polyp, hepatic flexure polyp  . RIGHT HEART CATH N/A 12/17/2019   Procedure: RIGHT HEART CATH;  Surgeon: Larey Dresser, MD;  Location: [place] CV LAB;  Service: Cardiovascular;  Laterality: N/A;  . TEE WITHOUT CARDIOVERSION N/A 12/19/2019   Procedure: TRANSESOPHAGEAL ECHOCARDIOGRAM (TEE);  Surgeon: Wonda Olds, MD;  Location: [place];  Service: Open Heart Surgery;  Laterality: N/A;  . UPPER GASTROINTESTINAL ENDOSCOPY      Current Outpatient Medications  Medication Sig Dispense Refill  . amiodarone (PACERONE) 200 MG tablet Take 1 tablet (200 mg total) by mouth daily. 30 tablet 1  . apixaban (ELIQUIS) 5 MG TABS tablet Take 1 tablet (5 mg total) by mouth 2 (two) times daily. 60 tablet 2  . Apremilast (OTEZLA) 30 MG TABS Take 1 tablet by mouth 2 (two) times daily.    Marland Kitchen aspirin EC 81 MG EC tablet Take 1 tablet (81 mg total) by mouth daily. Swallow whole.    Marland Kitchen atorvastatin (LIPITOR) 80 MG tablet Take 1 tablet (80 mg total) by mouth daily. 30 tablet 1  . carvedilol (COREG) 3.125 MG tablet Take 1 tablet (3.125 mg total) by mouth 2 (two) times daily with a meal. 60 tablet 1  . Darbepoetin Alfa (ARANESP) 60 MCG/0.3ML SOSY injection Inject 0.3 mLs (60 mcg total) into the vein every Wednesday with hemodialysis. 4.2 mL   . LORazepam (ATIVAN) 1 MG tablet Take 1 mg by mouth 2 (two) times daily as needed (panic attacks).     . pantoprazole (PROTONIX) 40 MG tablet Take 1 tablet (40 mg total) by mouth daily. 30 tablet 1  . tacrolimus (PROGRAF) 1 MG capsule Take 1  capsule (1 mg total) by mouth 2 (two) times daily. 60 capsule 1  . venlafaxine XR (EFFEXOR-XR) 75 MG 24 hr capsule Take 75 mg by mouth daily.     No current facility-administered medications for this visit.   Allergies:  Morphine and related   Social History: The patient  reports that he quit smoking about 12 years ago. His smoking use included cigarettes. He smoked 1.00 pack per day. He has never used smokeless tobacco. He reports that he does not drink alcohol and does not use drugs.   Family History: The patient's family history includes Diabetes in his mother; Heart disease in his father and son.   ROS:  Please see the history of present illness. Otherwise, complete review of systems is positive for none.  All other systems are reviewed and negative.   Physical Exam: VS:  BP 122/70   Pulse 71   Ht 5\' 11"  (1.803 m)  Wt 180 lb 3.2 oz (81.7 kg)   SpO2 98%   BMI 25.13 kg/m , BMI Body mass index is 25.13 kg/m.  Wt Readings from Last 3 Encounters:  01/19/20 180 lb 3.2 oz (81.7 kg)  01/15/20 176 lb (79.8 kg)  01/06/20 177 lb 3.2 oz (80.4 kg)    General: Patient appears comfortable at rest. Neck: Supple, no elevated JVP or carotid bruits, no thyromegaly. Lungs: Clear to auscultation, nonlabored breathing at rest. Cardiac: Regular rate and rhythm, no S3 or significant systolic murmur, no pericardial rub. Extremities: No pitting edema, distal pulses 2+. Skin: Warm and dry.  Median sternotomy, chest tube insertion site, and right leg vein harvesting site are clean and dry without infection noted. Musculoskeletal: No kyphosis. Neuropsychiatric: Alert and oriented x3, affect grossly appropriate.  ECG:  January 06, 2020 EKG normal sinus rhythm rate of 68, nonspecific intraventricular conduction block, abnormal QRS/T angle consider primary T wave abnormality.  Recent Labwork: 12/11/2019: TSH 4.855 12/29/2019: ALT 69; AST 37; Magnesium 2.4 01/01/2020: BUN 24; Creatinine, Ser 4.15; Hemoglobin  8.4; Platelets 275; Potassium 4.7; Sodium 135     Component Value Date/Time   CHOL 219 (H) 12/12/2019 0559   TRIG 117 12/12/2019 0559   HDL 38 (L) 12/12/2019 0559   CHOLHDL 5.8 12/12/2019 0559   VLDL 23 12/12/2019 0559   LDLCALC 158 (H) 12/12/2019 0559    Other Studies Reviewed Today:  Echocardiogram 12/23/2019 Limited  Limited study for tamponade. Very difficult study due to poor windows. No pericardial effusion present. LVEF severely reduced with global hypokinesis. There is severe hypokinesis of the entire lateral and inferior walls. EF reduced to 20-25% compared with 12/21/2019 which was likely 30-35%. 2. Left ventricular ejection fraction, by estimation, is 20 to 25%. The left ventricle has severely decreased function. 3. The mitral valve is grossly normal. Trivial mitral valve regurgitation. No evidence of mitral stenosis. 4. The aortic valve is grossly normal.  Echocardiogram 12/21/2019  1. Left ventricular ejection fraction, by estimation, is 35 to 40%. The left ventricle has moderately decreased function. Left ventricular diastolic parameters are consistent with Grade II diastolic dysfunction Elevated left atrial pressure. 2. Right ventricular systolic function is normal. The right ventricular size is normal. 3. The mitral valve is abnormal. Mild mitral valve regurgitation. 4. The aortic valve is abnormal. Aortic valve regurgitation is not visualized. Mild aortic valve sclerosis is present, with no evidence of aortic valve stenosis. 5. The inferior vena cava is dilated in size with >50% respiratory variability, suggesting right atrial pressure of 8 mmHg  Echocardiogram 12/19/2019  Left Ventricle: has severely reduced systolic function, with an ejection fraction of 25%. The wall motion is abnormal with regional variation. Improved anterior wall motion following CPB, but LV still severely reduced function. . Aorta: The aorta appears unchanged from pre-bypass. . Aortic  Valve: No evidence of AV insufficiency following separation from CPB. . Mitral Valve: Mitral valve regurgitation is trivial following separation from CPB. . Tricuspid Valve: The tricuspid valve appears unchanged from pre-bypass. . Interatrial Septum: s/p PFO closure with no evidence of intra-atrial shunting by Color flow doppler. . Pericardium: The pericardium appears unchanged from pre-bypass  RHC 12/17/2019 Conclusion  1. Elevated PCWP >> RA pressure.  2. Pulmonary venous hypertension.  3. Preserved cardiac output.   Vascular US doppler 12/16/2019 Right Carotid: Velocities in the right ICA are consistent with a 1-39% stenosis. Left Carotid: Velocities in the left ICA are consistent with a 1-39% stenosis. Vertebrals: Bilateral vertebral arteries demonstrate antegrade flow.  Right ABI: Resting right ankle-brachial index is within normal range. No evidence of significant right lower extremity arterial disease. Left ABI: Resting left ankle-brachial index is within normal range. No evidence of significant left lower extremity arterial disease. Right Upper Extremity: Doppler waveforms remain within normal limits with right radial compression. Doppler waveforms remain within normal limits with right ulnar compression. Left Upper Extremity: Doppler waveform obliterate with left radial compression. Doppler waveforms remain within normal limits with left ulnar compression. Electronically signed by Harold Barban MD on 12/16/2019 at 4:20:38 PM. Right Rt Pressure (mmHg) Index Waveform Comment Brachial triphasic restricted extremity PTA 134 1.13 triphasic DP 137 1.15 triphasic Left Lt Pressure (mmHg) Index Waveform Comment Brachial 119 triphasic PTA 131 1.10 triphasic DP 140 1.18 triphasic ABI/TBI Today's ABI/TBI Previous ABI/TBI Right 1.15 Left 1.18 Site Pressure Index Doppler Comments Brachial triphasic restricted extremity Radial triphasic Ulnar triphasic Site Pressure Index Doppler  Comments Brachial 119 triphasic Radial triphasic Ulnar triphasic   LHC 12/12/2019 LEFT HEART CATH AND CORONARY ANGIOGRAPHY  Conclusion    Dist RCA lesion is 100% stenosed. Faint left to right and right to right collaterals.  Ramus lesion is 95% stenosed.  1st Diag lesion is 75% stenosed.  Prox Cx lesion is 99% stenosed. THis is the culprit lesion. TIMI 2 flow.  Ost LAD to Prox LAD lesion is 60% stenosed. Complex lesion with shelf of plaque.  Mid LAD lesion is 80% stenosed.  LV end diastolic pressure is moderately elevated.  There is no aortic valve stenosis.   Plan for cardiac surgery consult.  Diagnostic Dominance: Right     Assessment and Plan:  1. Status post coronary artery bypass grafts x 5   2. Chronic systolic heart failure ([place])   3. CAD in native artery   4. Atrial fibrillation, unspecified type ([place])   5. Acute kidney injury ([place])    1. Status post coronary artery bypass grafts x 5 / NSTEMI CABG 7/16 with LIMA-LAD, RIMA-ramus, seq SVG-PD/PL, SVG-D.   Median sternotomy site, chest tube insertion site and right leg vein harvesting site clean and dry.  Patient states she has some mild soreness around median sternotomy site.  2. Chronic systolic heart failure ([place]) Echo 7/21 with EF 20-25%, moderately decreased RV systolic function. Cardiac MRI with LV EF 26%, RV EF 38%, near full thickness scar in inferolateral wall suggesting lack of viability in the LCx . No ARB, ARNI, Digoxin , or spironolactone 2/2 renal failure.  Repeat echo in 3 months.  Continue carvedilol 3.125 p.o. twice daily.  3. CAD in native artery Severe 3VD on cath 7/21 =>60-70% ostial LAD, 80% mLAD, 95% ramus, 99% proximal LCx with TIMI-2 flow down the rest of the LCx, occluded distal RCA. Suspect out of hospital inferolateral MI.  Denies any progressive anginal or exertional symptoms.  Continue aspirin 81 mg daily.  Continue atorvastatin 80 mg by mouth daily.  Continue carvedilol 3.125 mg  p.o. twice daily.  4.Atrial fibrillation S/P MAZE/ LAA clipping on Amio. 200 mg daily. Eliquis 5 mg po bid. HR and rhythm are regular at 71.  Continue amiodarone 200 mg p.o. daily.  Continue Eliquis 5 mg p.o. twice daily.  5. AKI on HD.  Suspect AKI 2/2 ATN post op. Nephrology following. Initially on CRRT then HD. On HD MWF.  Patient has a permacath in place in right upper chest.  He continues dialysis without issues.  Medication Adjustments/Labs and Tests Ordered: Current medicines are reviewed at length with the patient today.  Concerns  regarding medicines are outlined above.   Disposition: Follow-up with Dr. Domenic Polite or APP 3 months  Signed, Levell July, NP 01/19/2020 10:10 AM    [facility] [place], [place], [place] [address] Phone: [phone]; Fax: [phone]

## 2020-01-19 ENCOUNTER — Encounter: Payer: Self-pay | Admitting: Family Medicine

## 2020-01-19 ENCOUNTER — Ambulatory Visit: Payer: Medicare HMO | Admitting: Family Medicine

## 2020-01-19 VITALS — BP 122/70 | HR 71 | Ht 71.0 in | Wt 180.2 lb

## 2020-01-19 DIAGNOSIS — I5022 Chronic systolic (congestive) heart failure: Secondary | ICD-10-CM | POA: Diagnosis not present

## 2020-01-19 DIAGNOSIS — N179 Acute kidney failure, unspecified: Secondary | ICD-10-CM

## 2020-01-19 DIAGNOSIS — N189 Chronic kidney disease, unspecified: Secondary | ICD-10-CM | POA: Diagnosis not present

## 2020-01-19 DIAGNOSIS — I255 Ischemic cardiomyopathy: Secondary | ICD-10-CM | POA: Diagnosis not present

## 2020-01-19 DIAGNOSIS — I4891 Unspecified atrial fibrillation: Secondary | ICD-10-CM

## 2020-01-19 DIAGNOSIS — I2510 Atherosclerotic heart disease of native coronary artery without angina pectoris: Secondary | ICD-10-CM

## 2020-01-19 DIAGNOSIS — Z951 Presence of aortocoronary bypass graft: Secondary | ICD-10-CM

## 2020-01-19 DIAGNOSIS — Z23 Encounter for immunization: Secondary | ICD-10-CM | POA: Diagnosis not present

## 2020-01-19 NOTE — Patient Instructions (Addendum)
Medication Instructions:  Continue all current medications.  Labwork: none  Testing/Procedures: Your physician has requested that you have an echocardiogram. Echocardiography is a painless test that uses sound waves to create images of your heart. It provides your doctor with information about the size and shape of your heart and how well your heart's chambers and valves are working. This procedure takes approximately one hour. There are no restrictions for this procedure - DUE JUST PRIOR TO NEXT OFFICE VISIT   Follow-Up: 3 months   Any Other Special Instructions Will Be Listed Below (If Applicable).  You have been referred to:  Cardiac rehab.    If you need a refill on your cardiac medications before your next appointment, please call your pharmacy.

## 2020-01-20 DIAGNOSIS — I5023 Acute on chronic systolic (congestive) heart failure: Secondary | ICD-10-CM | POA: Diagnosis not present

## 2020-01-20 DIAGNOSIS — I480 Paroxysmal atrial fibrillation: Secondary | ICD-10-CM | POA: Diagnosis not present

## 2020-01-20 DIAGNOSIS — I2510 Atherosclerotic heart disease of native coronary artery without angina pectoris: Secondary | ICD-10-CM | POA: Diagnosis not present

## 2020-01-20 DIAGNOSIS — Z48812 Encounter for surgical aftercare following surgery on the circulatory system: Secondary | ICD-10-CM | POA: Diagnosis not present

## 2020-01-20 DIAGNOSIS — I5082 Biventricular heart failure: Secondary | ICD-10-CM | POA: Diagnosis not present

## 2020-01-20 DIAGNOSIS — E1122 Type 2 diabetes mellitus with diabetic chronic kidney disease: Secondary | ICD-10-CM | POA: Diagnosis not present

## 2020-01-20 DIAGNOSIS — N1831 Chronic kidney disease, stage 3a: Secondary | ICD-10-CM | POA: Diagnosis not present

## 2020-01-20 DIAGNOSIS — R69 Illness, unspecified: Secondary | ICD-10-CM | POA: Diagnosis not present

## 2020-01-20 DIAGNOSIS — I130 Hypertensive heart and chronic kidney disease with heart failure and stage 1 through stage 4 chronic kidney disease, or unspecified chronic kidney disease: Secondary | ICD-10-CM | POA: Diagnosis not present

## 2020-01-21 DIAGNOSIS — D849 Immunodeficiency, unspecified: Secondary | ICD-10-CM | POA: Diagnosis not present

## 2020-01-21 DIAGNOSIS — Z298 Encounter for other specified prophylactic measures: Secondary | ICD-10-CM | POA: Diagnosis not present

## 2020-01-21 DIAGNOSIS — N179 Acute kidney failure, unspecified: Secondary | ICD-10-CM | POA: Diagnosis not present

## 2020-01-21 DIAGNOSIS — N189 Chronic kidney disease, unspecified: Secondary | ICD-10-CM | POA: Diagnosis not present

## 2020-01-21 DIAGNOSIS — Z23 Encounter for immunization: Secondary | ICD-10-CM | POA: Diagnosis not present

## 2020-01-23 DIAGNOSIS — N189 Chronic kidney disease, unspecified: Secondary | ICD-10-CM | POA: Diagnosis not present

## 2020-01-23 DIAGNOSIS — D509 Iron deficiency anemia, unspecified: Secondary | ICD-10-CM | POA: Diagnosis not present

## 2020-01-23 DIAGNOSIS — Z23 Encounter for immunization: Secondary | ICD-10-CM | POA: Diagnosis not present

## 2020-01-23 DIAGNOSIS — N179 Acute kidney failure, unspecified: Secondary | ICD-10-CM | POA: Diagnosis not present

## 2020-01-26 DIAGNOSIS — N189 Chronic kidney disease, unspecified: Secondary | ICD-10-CM | POA: Diagnosis not present

## 2020-01-26 DIAGNOSIS — N179 Acute kidney failure, unspecified: Secondary | ICD-10-CM | POA: Diagnosis not present

## 2020-01-26 DIAGNOSIS — Z23 Encounter for immunization: Secondary | ICD-10-CM | POA: Diagnosis not present

## 2020-01-28 DIAGNOSIS — Z23 Encounter for immunization: Secondary | ICD-10-CM | POA: Diagnosis not present

## 2020-01-28 DIAGNOSIS — N179 Acute kidney failure, unspecified: Secondary | ICD-10-CM | POA: Diagnosis not present

## 2020-01-28 DIAGNOSIS — N189 Chronic kidney disease, unspecified: Secondary | ICD-10-CM | POA: Diagnosis not present

## 2020-01-29 DIAGNOSIS — L988 Other specified disorders of the skin and subcutaneous tissue: Secondary | ICD-10-CM | POA: Diagnosis not present

## 2020-01-29 DIAGNOSIS — C44519 Basal cell carcinoma of skin of other part of trunk: Secondary | ICD-10-CM | POA: Diagnosis not present

## 2020-01-30 DIAGNOSIS — N179 Acute kidney failure, unspecified: Secondary | ICD-10-CM | POA: Diagnosis not present

## 2020-01-30 DIAGNOSIS — N189 Chronic kidney disease, unspecified: Secondary | ICD-10-CM | POA: Diagnosis not present

## 2020-01-30 DIAGNOSIS — Z23 Encounter for immunization: Secondary | ICD-10-CM | POA: Diagnosis not present

## 2020-02-01 DIAGNOSIS — W228XXA Striking against or struck by other objects, initial encounter: Secondary | ICD-10-CM | POA: Diagnosis not present

## 2020-02-01 DIAGNOSIS — Z944 Liver transplant status: Secondary | ICD-10-CM | POA: Diagnosis not present

## 2020-02-01 DIAGNOSIS — Z23 Encounter for immunization: Secondary | ICD-10-CM | POA: Diagnosis not present

## 2020-02-01 DIAGNOSIS — I10 Essential (primary) hypertension: Secondary | ICD-10-CM | POA: Diagnosis not present

## 2020-02-01 DIAGNOSIS — W268XXA Contact with other sharp object(s), not elsewhere classified, initial encounter: Secondary | ICD-10-CM | POA: Diagnosis not present

## 2020-02-01 DIAGNOSIS — Z7901 Long term (current) use of anticoagulants: Secondary | ICD-10-CM | POA: Diagnosis not present

## 2020-02-01 DIAGNOSIS — S81812A Laceration without foreign body, left lower leg, initial encounter: Secondary | ICD-10-CM | POA: Diagnosis not present

## 2020-02-02 DIAGNOSIS — Z23 Encounter for immunization: Secondary | ICD-10-CM | POA: Diagnosis not present

## 2020-02-02 DIAGNOSIS — N189 Chronic kidney disease, unspecified: Secondary | ICD-10-CM | POA: Diagnosis not present

## 2020-02-02 DIAGNOSIS — N179 Acute kidney failure, unspecified: Secondary | ICD-10-CM | POA: Diagnosis not present

## 2020-02-03 DIAGNOSIS — Z1331 Encounter for screening for depression: Secondary | ICD-10-CM | POA: Diagnosis not present

## 2020-02-03 DIAGNOSIS — I10 Essential (primary) hypertension: Secondary | ICD-10-CM | POA: Diagnosis not present

## 2020-02-03 DIAGNOSIS — Z87891 Personal history of nicotine dependence: Secondary | ICD-10-CM | POA: Diagnosis not present

## 2020-02-03 DIAGNOSIS — R5383 Other fatigue: Secondary | ICD-10-CM | POA: Diagnosis not present

## 2020-02-03 DIAGNOSIS — Z299 Encounter for prophylactic measures, unspecified: Secondary | ICD-10-CM | POA: Diagnosis not present

## 2020-02-03 DIAGNOSIS — Z0000 Encounter for general adult medical examination without abnormal findings: Secondary | ICD-10-CM | POA: Diagnosis not present

## 2020-02-03 DIAGNOSIS — Z1339 Encounter for screening examination for other mental health and behavioral disorders: Secondary | ICD-10-CM | POA: Diagnosis not present

## 2020-02-03 DIAGNOSIS — Z7189 Other specified counseling: Secondary | ICD-10-CM | POA: Diagnosis not present

## 2020-02-03 DIAGNOSIS — N186 End stage renal disease: Secondary | ICD-10-CM | POA: Diagnosis not present

## 2020-02-03 DIAGNOSIS — E7800 Pure hypercholesterolemia, unspecified: Secondary | ICD-10-CM | POA: Diagnosis not present

## 2020-02-03 DIAGNOSIS — Z992 Dependence on renal dialysis: Secondary | ICD-10-CM | POA: Diagnosis not present

## 2020-02-04 DIAGNOSIS — N189 Chronic kidney disease, unspecified: Secondary | ICD-10-CM | POA: Diagnosis not present

## 2020-02-04 DIAGNOSIS — N179 Acute kidney failure, unspecified: Secondary | ICD-10-CM | POA: Diagnosis not present

## 2020-02-05 DIAGNOSIS — Z79899 Other long term (current) drug therapy: Secondary | ICD-10-CM | POA: Diagnosis not present

## 2020-02-05 DIAGNOSIS — R5383 Other fatigue: Secondary | ICD-10-CM | POA: Diagnosis not present

## 2020-02-05 DIAGNOSIS — Z125 Encounter for screening for malignant neoplasm of prostate: Secondary | ICD-10-CM | POA: Diagnosis not present

## 2020-02-05 DIAGNOSIS — E7800 Pure hypercholesterolemia, unspecified: Secondary | ICD-10-CM | POA: Diagnosis not present

## 2020-02-06 DIAGNOSIS — N189 Chronic kidney disease, unspecified: Secondary | ICD-10-CM | POA: Diagnosis not present

## 2020-02-06 DIAGNOSIS — N179 Acute kidney failure, unspecified: Secondary | ICD-10-CM | POA: Diagnosis not present

## 2020-02-11 ENCOUNTER — Other Ambulatory Visit (HOSPITAL_COMMUNITY): Payer: Self-pay | Admitting: Nephrology

## 2020-02-11 ENCOUNTER — Other Ambulatory Visit (HOSPITAL_COMMUNITY): Payer: Self-pay | Admitting: Surgery

## 2020-02-11 DIAGNOSIS — D631 Anemia in chronic kidney disease: Secondary | ICD-10-CM | POA: Diagnosis not present

## 2020-02-11 DIAGNOSIS — N186 End stage renal disease: Secondary | ICD-10-CM | POA: Diagnosis not present

## 2020-02-11 DIAGNOSIS — N1832 Chronic kidney disease, stage 3b: Secondary | ICD-10-CM

## 2020-02-11 DIAGNOSIS — N189 Chronic kidney disease, unspecified: Secondary | ICD-10-CM | POA: Diagnosis not present

## 2020-02-11 DIAGNOSIS — E211 Secondary hyperparathyroidism, not elsewhere classified: Secondary | ICD-10-CM | POA: Diagnosis not present

## 2020-02-11 DIAGNOSIS — I129 Hypertensive chronic kidney disease with stage 1 through stage 4 chronic kidney disease, or unspecified chronic kidney disease: Secondary | ICD-10-CM | POA: Diagnosis not present

## 2020-02-12 ENCOUNTER — Ambulatory Visit (HOSPITAL_COMMUNITY)
Admission: RE | Admit: 2020-02-12 | Discharge: 2020-02-12 | Disposition: A | Discharge status: Home / Self Care | Payer: Medicare HMO | Source: Ambulatory Visit | Attending: Nephrology | Admitting: Nephrology

## 2020-02-12 ENCOUNTER — Other Ambulatory Visit: Payer: Self-pay

## 2020-02-12 DIAGNOSIS — N19 Unspecified kidney failure: Secondary | ICD-10-CM | POA: Diagnosis not present

## 2020-02-12 DIAGNOSIS — N179 Acute kidney failure, unspecified: Secondary | ICD-10-CM | POA: Diagnosis not present

## 2020-02-12 DIAGNOSIS — N186 End stage renal disease: Secondary | ICD-10-CM

## 2020-02-12 DIAGNOSIS — Z4901 Encounter for fitting and adjustment of extracorporeal dialysis catheter: Secondary | ICD-10-CM | POA: Insufficient documentation

## 2020-02-12 DIAGNOSIS — D485 Neoplasm of uncertain behavior of skin: Secondary | ICD-10-CM | POA: Diagnosis not present

## 2020-02-12 DIAGNOSIS — L988 Other specified disorders of the skin and subcutaneous tissue: Secondary | ICD-10-CM | POA: Diagnosis not present

## 2020-02-12 HISTORY — PX: IR REMOVAL TUN CV CATH W/O FL: IMG2289

## 2020-02-12 MED ORDER — LIDOCAINE HCL 1 % IJ SOLN
INTRAMUSCULAR | Status: AC | PRN
  Administered 2020-02-12: 10 mL

## 2020-02-12 MED ORDER — CHLORHEXIDINE GLUCONATE 4 % EX LIQD
CUTANEOUS | Status: AC
  Filled 2020-02-12: qty 15

## 2020-02-12 MED ORDER — LIDOCAINE HCL 1 % IJ SOLN
INTRAMUSCULAR | Status: AC
  Filled 2020-02-12: qty 20

## 2020-02-12 NOTE — Procedures (Signed)
Successful removal of right IJ tunneled HD catheter.   After obtaining consent and performing a time-out, the right upper chest was prepped and draped in the normal sterile fashion. The heparin was removed from both ports. 1% lidocaine was used for local anesthesia. Using gentle blunt dissection the cuff of the catheter was exposed and the catheter was removed in its entirety. Pressure was held until hemostasis was obtained. A sterile dressing was applied. The patient tolerated the procedure well with no immediate complications.    Soyla Dryer, [place] [phone] 02/12/2020, 2:12 PM

## 2020-02-16 ENCOUNTER — Encounter: Payer: Self-pay | Admitting: Cardiothoracic Surgery

## 2020-02-17 ENCOUNTER — Encounter (HOSPITAL_COMMUNITY): Payer: Self-pay

## 2020-02-17 ENCOUNTER — Ambulatory Visit (HOSPITAL_COMMUNITY)
Admission: RE | Admit: 2020-02-17 | Discharge: 2020-02-17 | Disposition: A | Discharge status: Home / Self Care | Payer: Medicare HMO | Source: Ambulatory Visit | Attending: Cardiology | Admitting: Cardiology

## 2020-02-17 ENCOUNTER — Other Ambulatory Visit: Payer: Self-pay

## 2020-02-17 VITALS — BP 126/64 | HR 77 | Ht 71.0 in | Wt 182.6 lb

## 2020-02-17 DIAGNOSIS — Z944 Liver transplant status: Secondary | ICD-10-CM | POA: Insufficient documentation

## 2020-02-17 DIAGNOSIS — N179 Acute kidney failure, unspecified: Secondary | ICD-10-CM | POA: Diagnosis not present

## 2020-02-17 DIAGNOSIS — I5022 Chronic systolic (congestive) heart failure: Secondary | ICD-10-CM | POA: Diagnosis not present

## 2020-02-17 DIAGNOSIS — Z87891 Personal history of nicotine dependence: Secondary | ICD-10-CM | POA: Insufficient documentation

## 2020-02-17 DIAGNOSIS — Z7982 Long term (current) use of aspirin: Secondary | ICD-10-CM | POA: Diagnosis not present

## 2020-02-17 DIAGNOSIS — F329 Major depressive disorder, single episode, unspecified: Secondary | ICD-10-CM | POA: Diagnosis not present

## 2020-02-17 DIAGNOSIS — I2510 Atherosclerotic heart disease of native coronary artery without angina pectoris: Secondary | ICD-10-CM | POA: Insufficient documentation

## 2020-02-17 DIAGNOSIS — I252 Old myocardial infarction: Secondary | ICD-10-CM | POA: Insufficient documentation

## 2020-02-17 DIAGNOSIS — F419 Anxiety disorder, unspecified: Secondary | ICD-10-CM | POA: Diagnosis not present

## 2020-02-17 DIAGNOSIS — I10 Essential (primary) hypertension: Secondary | ICD-10-CM | POA: Diagnosis not present

## 2020-02-17 DIAGNOSIS — Z79899 Other long term (current) drug therapy: Secondary | ICD-10-CM | POA: Insufficient documentation

## 2020-02-17 DIAGNOSIS — L409 Psoriasis, unspecified: Secondary | ICD-10-CM | POA: Diagnosis not present

## 2020-02-17 DIAGNOSIS — I4891 Unspecified atrial fibrillation: Secondary | ICD-10-CM | POA: Diagnosis not present

## 2020-02-17 DIAGNOSIS — Z951 Presence of aortocoronary bypass graft: Secondary | ICD-10-CM | POA: Insufficient documentation

## 2020-02-17 DIAGNOSIS — I130 Hypertensive heart and chronic kidney disease with heart failure and stage 1 through stage 4 chronic kidney disease, or unspecified chronic kidney disease: Secondary | ICD-10-CM | POA: Insufficient documentation

## 2020-02-17 DIAGNOSIS — Z7901 Long term (current) use of anticoagulants: Secondary | ICD-10-CM | POA: Insufficient documentation

## 2020-02-17 DIAGNOSIS — E785 Hyperlipidemia, unspecified: Secondary | ICD-10-CM | POA: Diagnosis not present

## 2020-02-17 DIAGNOSIS — E1122 Type 2 diabetes mellitus with diabetic chronic kidney disease: Secondary | ICD-10-CM | POA: Insufficient documentation

## 2020-02-17 DIAGNOSIS — N183 Chronic kidney disease, stage 3 unspecified: Secondary | ICD-10-CM | POA: Diagnosis not present

## 2020-02-17 DIAGNOSIS — R69 Illness, unspecified: Secondary | ICD-10-CM | POA: Diagnosis not present

## 2020-02-17 LAB — COMPREHENSIVE METABOLIC PANEL
ALT: 15 U/L (ref 0–44)
AST: 19 U/L (ref 15–41)
Albumin: 3.1 g/dL — ABNORMAL LOW (ref 3.5–5.0)
Alkaline Phosphatase: 86 U/L (ref 38–126)
Anion gap: 8 (ref 5–15)
BUN: 14 mg/dL (ref 8–23)
CO2: 24 mmol/L (ref 22–32)
Calcium: 8.4 mg/dL — ABNORMAL LOW (ref 8.9–10.3)
Chloride: 109 mmol/L (ref 98–111)
Creatinine, Ser: 1.87 mg/dL — ABNORMAL HIGH (ref 0.61–1.24)
GFR calc Af Amer: 44 mL/min — ABNORMAL LOW (ref >60)
GFR calc non Af Amer: 38 mL/min — ABNORMAL LOW (ref >60)
Glucose, Bld: 114 mg/dL — ABNORMAL HIGH (ref 70–99)
Potassium: 3.9 mmol/L (ref 3.5–5.1)
Sodium: 141 mmol/L (ref 135–145)
Total Bilirubin: 0.7 mg/dL (ref 0.3–1.2)
Total Protein: 6.2 g/dL — ABNORMAL LOW (ref 6.5–8.1)

## 2020-02-17 LAB — LIPID PANEL
Cholesterol: 127 mg/dL (ref 0–200)
HDL: 44 mg/dL (ref >40)
LDL Cholesterol: 66 mg/dL (ref 0–99)
Total CHOL/HDL Ratio: 2.9 RATIO
Triglycerides: 83 mg/dL (ref <150)
VLDL: 17 mg/dL (ref 0–40)

## 2020-02-17 NOTE — Progress Notes (Signed)
Advanced Heart Failure Clinic Note   Referring Physician: PCP: Monico Blitz, MD PCP-Cardiologist: Candee Furbish, MD  [facility]: Dr. [name]   Reason for Visit:  F/u for Systolic Heart Failure   HPI:  64 y/o male w/ h/o Hep C s/p liver transplant in 2009(followed at [facility]), Stage III CKD, HTN and T2DM, recently admitted 7/21 w/ NSTEMI and Afib w/ RVR, found to have severe 3VD and biventricular heart failure, LVEF 20-25%, RV moderately reduced. Underwent CABG + MAZE + LA appendage clipping 7/16. Required milrinone post operatively. Post op course b/c ATN/ renal failure. Required CVVHD but remained anuric. Tunneled HD cath placed and transitioned to iHD, MWF.   He returns to clinic today for routine f/u. He reports that he is now off HD. Last session was 2 weeks ago. HD cath pulled last week. Making good urine. Wt stable off HD. Denies CP. No LEE, orthopnea, PND. Walking on elliptical for exercise and denies exertional dyspnea. BP well controlled today at 126/64. EKG shows NSR.     Review of systems complete and found to be negative unless listed in HPI.      Past Medical History:  Diagnosis Date  . Anxiety   . CKD (chronic kidney disease), stage III   . Depression   . Diabetes mellitus type 2, noninsulin dependent ([place])   . Hepatitis C   . Hypertension   . Psoriasis   . Status post liver transplant [facility])     Current Outpatient Medications  Medication Sig Dispense Refill  . amiodarone (PACERONE) 200 MG tablet Take 1 tablet (200 mg total) by mouth daily. 30 tablet 1  . apixaban (ELIQUIS) 5 MG TABS tablet Take 1 tablet (5 mg total) by mouth 2 (two) times daily. 60 tablet 2  . Apremilast (OTEZLA) 30 MG TABS Take 1 tablet by mouth 2 (two) times daily.    Marland Kitchen aspirin EC 81 MG EC tablet Take 1 tablet (81 mg total) by mouth daily. Swallow whole.    Marland Kitchen atorvastatin (LIPITOR) 80 MG tablet Take 1 tablet (80 mg total) by mouth daily. 30 tablet 1  . carvedilol (COREG) 3.125 MG tablet Take 1 tablet  (3.125 mg total) by mouth 2 (two) times daily with a meal. 60 tablet 1  . Darbepoetin Alfa (ARANESP) 60 MCG/0.3ML SOSY injection Inject 0.3 mLs (60 mcg total) into the vein every Wednesday with hemodialysis. 4.2 mL   . Docusate Sodium (DSS) 100 MG CAPS Take by mouth.    Marland Kitchen LORazepam (ATIVAN) 1 MG tablet Take 1 mg by mouth 2 (two) times daily as needed (panic attacks).     . pantoprazole (PROTONIX) 40 MG tablet Take 1 tablet (40 mg total) by mouth daily. 30 tablet 1  . tacrolimus (PROGRAF) 1 MG capsule Take 1 capsule (1 mg total) by mouth 2 (two) times daily. 60 capsule 1  . TEMAZEPAM PO Take by mouth.    . venlafaxine XR (EFFEXOR-XR) 75 MG 24 hr capsule Take 75 mg by mouth daily.     No current facility-administered medications for this encounter.    Allergies  Allergen Reactions  . Morphine And Related Nausea And Vomiting      Social History   Socioeconomic History  . Marital status: Single    Spouse name: Not on file  . Number of children: Not on file  . Years of education: Not on file  . Highest education level: Not on file  Occupational History  . Not on file  Tobacco Use  .  Smoking status: Former Smoker    Packs/day: 1.00    Types: Cigarettes    Quit date: 09/04/2007    Years since quitting: 12.4  . Smokeless tobacco: Never Used  Vaping Use  . Vaping Use: Never used  Substance and Sexual Activity  . Alcohol use: No  . Drug use: No  . Sexual activity: Yes    Birth control/protection: None  Other Topics Concern  . Not on file  Social History Narrative  . Not on file   Social Determinants of Health   Financial Resource Strain:   . Difficulty of Paying Living Expenses: Not on file  Food Insecurity: No Food Insecurity  . Worried About Charity fundraiser in the Last Year: Never true  . Ran Out of Food in the Last Year: Never true  Transportation Needs: No Transportation Needs  . Lack of Transportation (Medical): No  . Lack of Transportation (Non-Medical): No   Physical Activity:   . Days of Exercise per Week: Not on file  . Minutes of Exercise per Session: Not on file  Stress:   . Feeling of Stress : Not on file  Social Connections:   . Frequency of Communication with Friends and Family: Not on file  . Frequency of Social Gatherings with Friends and Family: Not on file  . Attends Religious Services: Not on file  . Active Member of Clubs or Organizations: Not on file  . Attends Archivist Meetings: Not on file  . Marital Status: Not on file  Intimate Partner Violence:   . Fear of Current or Ex-Partner: Not on file  . Emotionally Abused: Not on file  . Physically Abused: Not on file  . Sexually Abused: Not on file      Family History  Problem Relation Age of Onset  . Diabetes Mother   . Heart disease Father   . Heart disease Son     Vitals:   02/17/20 1147  BP: 126/64  Pulse: 77  SpO2: 97%  Weight: 82.8 kg (182 lb 9.6 oz)  Height: 5\' 11"  (1.803 m)   PHYSICAL EXAM: General:  Well appearing. No respiratory difficulty HEENT: normal Neck: supple. no JVD. Carotids 2+ bilat; no bruits. No lymphadenopathy or thyromegaly appreciated. Cor: PMI nondisplaced. Regular rate & rhythm. No rubs, gallops or murmurs. Lungs: clear Abdomen: soft, nontender, nondistended. No hepatosplenomegaly. No bruits or masses. Good bowel sounds. Extremities: no cyanosis, clubbing, rash, edema Neuro: alert & oriented x 3, cranial nerves grossly intact. moves all 4 extremities w/o difficulty. Affect pleasant.   ECG: NSR 69 bpm   ASSESSMENT & PLAN:  1. CAD: Severe 3VD on cath 7/21 =>60-70% ostial LAD, 80% mLAD, 95% ramus, 99% proximal LCx with TIMI-2 flow down the rest of the LCx, occluded distal RCA. Suspect out of hospital inferolateral MI. Cardiac MRI showed that the inferolateral wall is likely not viable, but the remainder of the LV myocardium is likely viable. He had CABG 7/16 with LIMA-LAD, RIMA-ramus, seq SVG-PD/PL, SVG-D.  - doing well  post operatively. No further chest pain - Continue ASA 81 mg daily  - Continue atorvastatin 80 mg qhs. LDL goal <70. Check Lipid panel today  2. Chronic Systolic CHF: Echo 2/35 with EF 20-25%, moderately decreased RV systolic function. Cardiac MRI with LV EF 26%, RV EF 38%, near full thickness scar in inferolateral wall suggesting lack of viability in the LCx territory.  Now post-CABG. Repeat limited echo 7/20 showed LVEF 20-25%, RV normal, no pericardial effusion.  -  NYHA II - Euvolemic on exam. Now off HD, nephrology still following  - Continue Coreg 3.125 mg bid.    - no ARB/ARNI, dig nor spiro w/ CKD 3. Atrial fibrillation:  S/p Maze + LAA clipping on 7/16.  - he was in NSR by EKG at first hospital f/u and remains in NSR today  - discussed w/ Dr. [name]. Will d/c amiodarone. Continue anticoagulation  - Continue Eliquis 5 mg bid. Denies abnormal bleeding. Check CBC today  4. AKI on CKD stage 3: Prior baseline SCr ~1.3. Creatinine rose to 6.46 post CABG. Suspect post-op ATN. CRRT initiated 7/19>>transitioned to iHD. Now off HD. Reports good UOP. Has f/u w/ nephrology in 1 week  - check CMP today  5. Type 2 diabetes:  - well controlled. Most recent Hgb A1c 5.9  - followed by PCP  6. Liver transplant for HCV:  - Followed at [facility]. On Tacrolimus  7. HLD w/ LDL goal < 70 (CAD) - LDL elevated at 158 mg/dL 7/21 - now on atorvastatin 80 mg daily   - check Lipid panel and HFTs today   Repeat echo in 1 month. F/u w/ Dr. [name] in 4-6 weeks    Lyda Jester, PA-C 02/17/20

## 2020-02-17 NOTE — Patient Instructions (Signed)
STOP Amiodarone  Labs today We will only contact you if something comes back abnormal or we need to make some changes. Otherwise no news is good news!  Your physician has requested that you have an echocardiogram. Echocardiography is a painless test that uses sound waves to create images of your heart. It provides your doctor with information about the size and shape of your heart and how well your hearts chambers and valves are working. This procedure takes approximately one hour. There are no restrictions for this procedure.  Your physician recommends that you schedule a follow-up appointment in: 2-3 months with Dr [name]   If you have any questions or concerns before your next appointment please send [place] a message through [facility] or call our office at [phone].    TO LEAVE A MESSAGE FOR THE NURSE SELECT OPTION 2, PLEASE LEAVE A MESSAGE INCLUDING:  YOUR NAME  DATE OF BIRTH  CALL BACK NUMBER  REASON FOR CALL**this is important as we prioritize the call backs  YOU WILL RECEIVE A CALL BACK THE SAME DAY AS LONG AS YOU CALL BEFORE 4:00 PM

## 2020-02-18 ENCOUNTER — Other Ambulatory Visit: Payer: Medicare HMO | Admitting: *Deleted

## 2020-02-18 NOTE — Patient Outreach (Signed)
[facility]) Care Management  02/18/2020  Joseph Greer Jun 01, 1956 [id number]   [facility] Unsuccessful outreach  Joseph Joseph Greer was referred to [facility] on 01/05/20 for EMMI general for other questions/problems. Joseph Greer was outreached and EMMI addressed. He reported his concerns about his Hemodialysis (HD) catheter had been resolved   Outreach attempt to the home number to follow up with him   No answer. [facility] RN CM left HIPAA [facility] Portability and Accountability Act) compliant voicemail message along with CM's contact info.   Plan: [facility] RN CM scheduled this patient for another call attempt within 4-7 business days  Joseph Karner L. Joseph Hamman, RN, BSN, [place] Coordinator Office number [phone] Mobile number [phone]  Main [facility] number [phone] Fax number [phone]

## 2020-02-18 NOTE — Addendum Note (Signed)
Encounter addended by: Harvie Junior, CMA on: 02/18/2020 2:28 PM  Actions taken: Charge Capture section accepted

## 2020-02-20 ENCOUNTER — Other Ambulatory Visit: Payer: Self-pay | Admitting: Surgical

## 2020-02-23 DIAGNOSIS — E211 Secondary hyperparathyroidism, not elsewhere classified: Secondary | ICD-10-CM | POA: Diagnosis not present

## 2020-02-23 DIAGNOSIS — N1832 Chronic kidney disease, stage 3b: Secondary | ICD-10-CM | POA: Diagnosis not present

## 2020-02-23 DIAGNOSIS — I129 Hypertensive chronic kidney disease with stage 1 through stage 4 chronic kidney disease, or unspecified chronic kidney disease: Secondary | ICD-10-CM | POA: Diagnosis not present

## 2020-02-23 DIAGNOSIS — D631 Anemia in chronic kidney disease: Secondary | ICD-10-CM | POA: Diagnosis not present

## 2020-02-23 DIAGNOSIS — N189 Chronic kidney disease, unspecified: Secondary | ICD-10-CM | POA: Diagnosis not present

## 2020-02-24 ENCOUNTER — Other Ambulatory Visit: Payer: Self-pay | Admitting: *Deleted

## 2020-02-24 NOTE — Patient Outreach (Signed)
[place] [facility]) Care Management  02/24/2020  [name] [facility] 1955-10-14 [id number]   [facility] second Unsuccessful outreach  Mr Joseph Greer was referred to [facility] on 01/05/20 for EMMI general for other questions/problems. Mr Joseph Greer was outreached and EMMI addressed. He reported his concerns about his Hemodialysis (HD) catheter had been resolved   Second Outreach attempt to the home number to follow up with him   No answer. [facility] RN CM left HIPAA [facility] Portability and Accountability Act) compliant voicemail message along with CMs contact info.   Plan: [facility] RN CM scheduled this patient for another call attempt within 4-7 business days [facility] unsuccessful outreach letter sent  [name], RN, BSN, [place] Coordinator Office number [phone] Mobile number [phone]  Main [facility] number [phone] Fax number [phone]

## 2020-02-26 DIAGNOSIS — N1832 Chronic kidney disease, stage 3b: Secondary | ICD-10-CM | POA: Diagnosis not present

## 2020-02-26 DIAGNOSIS — D631 Anemia in chronic kidney disease: Secondary | ICD-10-CM | POA: Diagnosis not present

## 2020-02-26 DIAGNOSIS — I129 Hypertensive chronic kidney disease with stage 1 through stage 4 chronic kidney disease, or unspecified chronic kidney disease: Secondary | ICD-10-CM | POA: Diagnosis not present

## 2020-02-26 DIAGNOSIS — N189 Chronic kidney disease, unspecified: Secondary | ICD-10-CM | POA: Diagnosis not present

## 2020-02-26 DIAGNOSIS — R809 Proteinuria, unspecified: Secondary | ICD-10-CM | POA: Diagnosis not present

## 2020-02-29 ENCOUNTER — Other Ambulatory Visit: Payer: Self-pay | Admitting: Surgical

## 2020-03-01 ENCOUNTER — Other Ambulatory Visit: Payer: Self-pay | Admitting: *Deleted

## 2020-03-01 ENCOUNTER — Ambulatory Visit (INDEPENDENT_AMBULATORY_CARE_PROVIDER_SITE_OTHER): Payer: Self-pay | Admitting: Cardiothoracic Surgery

## 2020-03-01 ENCOUNTER — Other Ambulatory Visit: Payer: Self-pay

## 2020-03-01 ENCOUNTER — Other Ambulatory Visit: Payer: Self-pay | Admitting: Surgical

## 2020-03-01 VITALS — BP 140/80 | HR 77 | Temp 97.7°F | Resp 20 | Ht 71.0 in | Wt 177.0 lb

## 2020-03-01 DIAGNOSIS — I2510 Atherosclerotic heart disease of native coronary artery without angina pectoris: Secondary | ICD-10-CM

## 2020-03-01 DIAGNOSIS — Z951 Presence of aortocoronary bypass graft: Secondary | ICD-10-CM

## 2020-03-01 MED ORDER — ATORVASTATIN CALCIUM 80 MG PO TABS: 80.0000 mg | ORAL_TABLET | Freq: Every day | ORAL | 11 refills | Status: DC

## 2020-03-01 MED ORDER — CARVEDILOL 3.125 MG PO TABS: 3.1250 mg | ORAL_TABLET | Freq: Two times a day (BID) | ORAL | 11 refills | Status: DC

## 2020-03-01 MED ORDER — PANTOPRAZOLE SODIUM 40 MG PO TBEC: 40.0000 mg | DELAYED_RELEASE_TABLET | Freq: Every day | ORAL | 1 refills | Status: DC

## 2020-03-01 NOTE — Patient Outreach (Signed)
[facility] Care Management  03/01/2020  [name] [facility] 1956-03-31 [id number]   [facility] third Unsuccessful outreach  Joseph Greer was referred to [facility] on 01/05/20 for EMMI general for other questions/problems. Joseph Greer was outreached and EMMI addressed. He reported his concerns about hisHemodialysis (HD)catheter had been resolved   ThirdOutreach attempt to the home numberto follow up with him No answer. [facility] RN CM left HIPAA [facility] Portability and Accountability Act) compliant voicemail message along with CM's contact info.   Plan: [facility] RN CM scheduled this [facility] engaged patient for case closure per [facility] workflow   Lendell Gallick L. Lavina Hamman, RN, BSN, [place] Coordinator Office number [phone] Mobile number [phone]  Main [facility] number [phone] Fax number [phone]

## 2020-03-01 NOTE — Progress Notes (Signed)
64 year old man status post CABG 2 months ago.  His postoperative course was complicated by acute renal failure requiring dialysis.  He now returns for initial postoperative visit.  He is not longer requiring dialysis.  He is back to the gym doing cardio and would like to start doing some strength training.  Occasionally he has shortness of breath with exertion.  There has been no recent echo or other cardiac assessment.    Physical exam: BP 140/80    Pulse 77    Temp 97.7 F (36.5 C) (Skin)    Resp 20    Ht 5\' 11"  (1.803 m)    Wt 80.3 kg    SpO2 99% Comment: RA   BMI 24.69 kg/m  Well-appearing man in no acute distress Clear to auscultation bilaterally Regular rate and rhythm Incisions well-healed No peripheral edema  Imaging: No new studies to review  Impression: Doing well after CABG for depressed ejection fraction No evidence for ongoing atrial fibrillation  Plan: Okay to stop Eliquis and amiodarone. Prescriptions given for carvedilol, Lipitor, and Protonix Follow-up with thoracic surgery as needed Follow-up with cardiology  Amalea Ottey Z. Orvan Seen, [place]

## 2020-03-04 DIAGNOSIS — K219 Gastro-esophageal reflux disease without esophagitis: Secondary | ICD-10-CM | POA: Diagnosis not present

## 2020-03-04 DIAGNOSIS — R69 Illness, unspecified: Secondary | ICD-10-CM | POA: Diagnosis not present

## 2020-03-04 DIAGNOSIS — Z992 Dependence on renal dialysis: Secondary | ICD-10-CM | POA: Diagnosis not present

## 2020-03-04 DIAGNOSIS — I10 Essential (primary) hypertension: Secondary | ICD-10-CM | POA: Diagnosis not present

## 2020-03-04 DIAGNOSIS — N186 End stage renal disease: Secondary | ICD-10-CM | POA: Diagnosis not present

## 2020-03-15 ENCOUNTER — Other Ambulatory Visit: Payer: Self-pay | Admitting: *Deleted

## 2020-03-15 NOTE — Patient Outreach (Signed)
[facility]) Care Management  03/15/2020  Joseph Greer [facility] 14-Jan-1956 [id number]   [facility] Case closure   Call attempts made on 02/18/20 02/24/20 and 03/01/20 Unsuccessful outreach letter sent on 02/24/20 without a response   Plan [facility] RN CM will close case after no response from patient within 10 business days. Unable to maintain contact  Case closure letters sent to patient and MD  [name], RN, BSN, [place] Coordinator Office number [phone] Mobile number [phone]  Main [facility] number [phone] Fax number [phone]

## 2020-03-29 DIAGNOSIS — I129 Hypertensive chronic kidney disease with stage 1 through stage 4 chronic kidney disease, or unspecified chronic kidney disease: Secondary | ICD-10-CM | POA: Diagnosis not present

## 2020-03-29 DIAGNOSIS — Z944 Liver transplant status: Secondary | ICD-10-CM | POA: Diagnosis not present

## 2020-03-29 DIAGNOSIS — D631 Anemia in chronic kidney disease: Secondary | ICD-10-CM | POA: Diagnosis not present

## 2020-03-29 DIAGNOSIS — N1832 Chronic kidney disease, stage 3b: Secondary | ICD-10-CM | POA: Diagnosis not present

## 2020-04-01 ENCOUNTER — Other Ambulatory Visit: Payer: Self-pay

## 2020-04-01 ENCOUNTER — Emergency Department (HOSPITAL_COMMUNITY): Payer: Medicare HMO

## 2020-04-01 ENCOUNTER — Encounter (HOSPITAL_COMMUNITY): Payer: Self-pay | Admitting: Emergency Medicine

## 2020-04-01 ENCOUNTER — Inpatient Hospital Stay (HOSPITAL_COMMUNITY)
Admission: EM | Admit: 2020-04-01 | Discharge: 2020-04-03 | DRG: 291 | Disposition: A | Discharge status: Home Health | Payer: Medicare HMO | Attending: Family Medicine | Admitting: Family Medicine

## 2020-04-01 DIAGNOSIS — I219 Acute myocardial infarction, unspecified: Secondary | ICD-10-CM | POA: Diagnosis present

## 2020-04-01 DIAGNOSIS — Z7902 Long term (current) use of antithrombotics/antiplatelets: Secondary | ICD-10-CM

## 2020-04-01 DIAGNOSIS — Z791 Long term (current) use of non-steroidal anti-inflammatories (NSAID): Secondary | ICD-10-CM

## 2020-04-01 DIAGNOSIS — I5021 Acute systolic (congestive) heart failure: Secondary | ICD-10-CM | POA: Diagnosis present

## 2020-04-01 DIAGNOSIS — I252 Old myocardial infarction: Secondary | ICD-10-CM

## 2020-04-01 DIAGNOSIS — Z79899 Other long term (current) drug therapy: Secondary | ICD-10-CM

## 2020-04-01 DIAGNOSIS — E1122 Type 2 diabetes mellitus with diabetic chronic kidney disease: Secondary | ICD-10-CM | POA: Diagnosis present

## 2020-04-01 DIAGNOSIS — B182 Chronic viral hepatitis C: Secondary | ICD-10-CM | POA: Diagnosis present

## 2020-04-01 DIAGNOSIS — R079 Chest pain, unspecified: Secondary | ICD-10-CM | POA: Diagnosis not present

## 2020-04-01 DIAGNOSIS — I130 Hypertensive heart and chronic kidney disease with heart failure and stage 1 through stage 4 chronic kidney disease, or unspecified chronic kidney disease: Principal | ICD-10-CM | POA: Diagnosis present

## 2020-04-01 DIAGNOSIS — J90 Pleural effusion, not elsewhere classified: Secondary | ICD-10-CM | POA: Diagnosis not present

## 2020-04-01 DIAGNOSIS — I5023 Acute on chronic systolic (congestive) heart failure: Secondary | ICD-10-CM | POA: Diagnosis present

## 2020-04-01 DIAGNOSIS — Z87891 Personal history of nicotine dependence: Secondary | ICD-10-CM

## 2020-04-01 DIAGNOSIS — Z833 Family history of diabetes mellitus: Secondary | ICD-10-CM

## 2020-04-01 DIAGNOSIS — L409 Psoriasis, unspecified: Secondary | ICD-10-CM | POA: Diagnosis present

## 2020-04-01 DIAGNOSIS — Z885 Allergy status to narcotic agent status: Secondary | ICD-10-CM

## 2020-04-01 DIAGNOSIS — Z944 Liver transplant status: Secondary | ICD-10-CM

## 2020-04-01 DIAGNOSIS — E119 Type 2 diabetes mellitus without complications: Secondary | ICD-10-CM

## 2020-04-01 DIAGNOSIS — D849 Immunodeficiency, unspecified: Secondary | ICD-10-CM | POA: Diagnosis present

## 2020-04-01 DIAGNOSIS — I2510 Atherosclerotic heart disease of native coronary artery without angina pectoris: Secondary | ICD-10-CM | POA: Diagnosis present

## 2020-04-01 DIAGNOSIS — I5082 Biventricular heart failure: Secondary | ICD-10-CM | POA: Diagnosis present

## 2020-04-01 DIAGNOSIS — I10 Essential (primary) hypertension: Secondary | ICD-10-CM | POA: Diagnosis present

## 2020-04-01 DIAGNOSIS — I482 Chronic atrial fibrillation, unspecified: Secondary | ICD-10-CM | POA: Diagnosis present

## 2020-04-01 DIAGNOSIS — F419 Anxiety disorder, unspecified: Secondary | ICD-10-CM | POA: Diagnosis present

## 2020-04-01 DIAGNOSIS — Z951 Presence of aortocoronary bypass graft: Secondary | ICD-10-CM | POA: Diagnosis not present

## 2020-04-01 DIAGNOSIS — Z85828 Personal history of other malignant neoplasm of skin: Secondary | ICD-10-CM | POA: Diagnosis not present

## 2020-04-01 DIAGNOSIS — I110 Hypertensive heart disease with heart failure: Secondary | ICD-10-CM | POA: Diagnosis not present

## 2020-04-01 DIAGNOSIS — I509 Heart failure, unspecified: Secondary | ICD-10-CM

## 2020-04-01 DIAGNOSIS — Z20822 Contact with and (suspected) exposure to covid-19: Secondary | ICD-10-CM | POA: Diagnosis present

## 2020-04-01 DIAGNOSIS — L570 Actinic keratosis: Secondary | ICD-10-CM | POA: Diagnosis not present

## 2020-04-01 DIAGNOSIS — I781 Nevus, non-neoplastic: Secondary | ICD-10-CM | POA: Diagnosis not present

## 2020-04-01 DIAGNOSIS — N1832 Chronic kidney disease, stage 3b: Secondary | ICD-10-CM | POA: Diagnosis present

## 2020-04-01 DIAGNOSIS — L814 Other melanin hyperpigmentation: Secondary | ICD-10-CM | POA: Diagnosis not present

## 2020-04-01 DIAGNOSIS — Z8249 Family history of ischemic heart disease and other diseases of the circulatory system: Secondary | ICD-10-CM

## 2020-04-01 DIAGNOSIS — F32A Depression, unspecified: Secondary | ICD-10-CM | POA: Diagnosis present

## 2020-04-01 DIAGNOSIS — Z7982 Long term (current) use of aspirin: Secondary | ICD-10-CM

## 2020-04-01 DIAGNOSIS — E785 Hyperlipidemia, unspecified: Secondary | ICD-10-CM | POA: Diagnosis present

## 2020-04-01 HISTORY — DX: Heart failure, unspecified: I50.9

## 2020-04-01 HISTORY — DX: Atherosclerotic heart disease of native coronary artery without angina pectoris: I25.10

## 2020-04-01 LAB — URINALYSIS, ROUTINE W REFLEX MICROSCOPIC
Bacteria, UA: NONE SEEN
Bilirubin Urine: NEGATIVE
Glucose, UA: NEGATIVE mg/dL
Ketones, ur: 5 mg/dL — AB
Leukocytes,Ua: NEGATIVE
Nitrite: NEGATIVE
Protein, ur: >=300 mg/dL — AB
Specific Gravity, Urine: 1.02 (ref 1.005–1.030)
pH: 5 (ref 5.0–8.0)

## 2020-04-01 LAB — TROPONIN I (HIGH SENSITIVITY)
Troponin I (High Sensitivity): 64 ng/L — ABNORMAL HIGH (ref <18)
Troponin I (High Sensitivity): 66 ng/L — ABNORMAL HIGH (ref <18)
Troponin I (High Sensitivity): 64 ng/L — ABNORMAL HIGH (ref <18)
Troponin I (High Sensitivity): 71 ng/L — ABNORMAL HIGH (ref <18)

## 2020-04-01 LAB — HEPATIC FUNCTION PANEL
ALT: 12 U/L (ref 0–44)
AST: 16 U/L (ref 15–41)
Albumin: 3.7 g/dL (ref 3.5–5.0)
Alkaline Phosphatase: 82 U/L (ref 38–126)
Bilirubin, Direct: 0.2 mg/dL (ref 0.0–0.2)
Indirect Bilirubin: 1.1 mg/dL — ABNORMAL HIGH (ref 0.3–0.9)
Total Bilirubin: 1.3 mg/dL — ABNORMAL HIGH (ref 0.3–1.2)
Total Protein: 7 g/dL (ref 6.5–8.1)

## 2020-04-01 LAB — CBC
HCT: 33.2 % — ABNORMAL LOW (ref 39.0–52.0)
Hemoglobin: 10.4 g/dL — ABNORMAL LOW (ref 13.0–17.0)
MCH: 27.6 pg (ref 26.0–34.0)
MCHC: 31.3 g/dL (ref 30.0–36.0)
MCV: 88.1 fL (ref 80.0–100.0)
Platelets: 223 10*3/uL (ref 150–400)
RBC: 3.77 MIL/uL — ABNORMAL LOW (ref 4.22–5.81)
RDW: 16.2 % — ABNORMAL HIGH (ref 11.5–15.5)
WBC: 6.3 10*3/uL (ref 4.0–10.5)
nRBC: 0 % (ref 0.0–0.2)

## 2020-04-01 LAB — BASIC METABOLIC PANEL
Anion gap: 10 (ref 5–15)
BUN: 19 mg/dL (ref 8–23)
CO2: 22 mmol/L (ref 22–32)
Calcium: 8.6 mg/dL — ABNORMAL LOW (ref 8.9–10.3)
Chloride: 107 mmol/L (ref 98–111)
Creatinine, Ser: 1.79 mg/dL — ABNORMAL HIGH (ref 0.61–1.24)
GFR, Estimated: 42 mL/min — ABNORMAL LOW (ref >60)
Glucose, Bld: 107 mg/dL — ABNORMAL HIGH (ref 70–99)
Potassium: 3.9 mmol/L (ref 3.5–5.1)
Sodium: 139 mmol/L (ref 135–145)

## 2020-04-01 LAB — RESPIRATORY PANEL BY RT PCR (FLU A&B, COVID)
Influenza A by PCR: NEGATIVE
Influenza B by PCR: NEGATIVE
SARS Coronavirus 2 by RT PCR: NEGATIVE

## 2020-04-01 LAB — LIPASE, BLOOD: Lipase: 17 U/L (ref 11–51)

## 2020-04-01 LAB — BRAIN NATRIURETIC PEPTIDE: B Natriuretic Peptide: 1413 pg/mL — ABNORMAL HIGH (ref 0.0–100.0)

## 2020-04-01 MED ORDER — TACROLIMUS 0.5 MG PO CAPS
1.0000 mg | ORAL_CAPSULE | Freq: Every day | ORAL | Status: DC
  Administered 2020-04-02 – 2020-04-03 (×2): 1 mg via ORAL
  Filled 2020-04-01: qty 1
  Filled 2020-04-01: qty 2
  Filled 2020-04-01 (×2): qty 1
  Filled 2020-04-01: qty 2
  Filled 2020-04-01: qty 1

## 2020-04-01 MED ORDER — CARVEDILOL 3.125 MG PO TABS
3.1250 mg | ORAL_TABLET | Freq: Two times a day (BID) | ORAL | Status: DC
  Administered 2020-04-02 – 2020-04-03 (×3): 3.125 mg via ORAL
  Filled 2020-04-01 (×3): qty 1

## 2020-04-01 MED ORDER — TEMAZEPAM 15 MG PO CAPS
30.0000 mg | ORAL_CAPSULE | Freq: Every day | ORAL | Status: DC
  Administered 2020-04-01 – 2020-04-02 (×2): 30 mg via ORAL
  Filled 2020-04-01 (×2): qty 2

## 2020-04-01 MED ORDER — APREMILAST 30 MG PO TABS
1.0000 | ORAL_TABLET | Freq: Two times a day (BID) | ORAL | Status: DC
  Administered 2020-04-02 – 2020-04-03 (×3): 30 mg via ORAL

## 2020-04-01 MED ORDER — FUROSEMIDE 10 MG/ML IJ SOLN
40.0000 mg | Freq: Once | INTRAMUSCULAR | Status: AC
  Administered 2020-04-01: 40 mg via INTRAVENOUS
  Filled 2020-04-01: qty 4

## 2020-04-01 MED ORDER — ACETAMINOPHEN 500 MG PO TABS
1000.0000 mg | ORAL_TABLET | Freq: Once | ORAL | Status: DC
  Filled 2020-04-01: qty 2

## 2020-04-01 MED ORDER — SODIUM CHLORIDE 0.9% FLUSH: 3.0000 mL | INTRAVENOUS | Status: DC | PRN

## 2020-04-01 MED ORDER — ONDANSETRON HCL 4 MG/2ML IJ SOLN
4.0000 mg | Freq: Four times a day (QID) | INTRAMUSCULAR | Status: DC | PRN
  Administered 2020-04-03: 4 mg via INTRAVENOUS
  Filled 2020-04-01: qty 2

## 2020-04-01 MED ORDER — TACROLIMUS 1 MG PO CAPS: 1.0000 mg | ORAL_CAPSULE | ORAL | Status: DC

## 2020-04-01 MED ORDER — ACETAMINOPHEN 325 MG PO TABS: 650.0000 mg | ORAL_TABLET | ORAL | Status: DC | PRN

## 2020-04-01 MED ORDER — SODIUM CHLORIDE 0.9% FLUSH
3.0000 mL | Freq: Two times a day (BID) | INTRAVENOUS | Status: DC
  Administered 2020-04-02 – 2020-04-03 (×3): 3 mL via INTRAVENOUS

## 2020-04-01 MED ORDER — FUROSEMIDE 10 MG/ML IJ SOLN
40.0000 mg | Freq: Every day | INTRAMUSCULAR | Status: AC
  Administered 2020-04-02: 40 mg via INTRAVENOUS
  Filled 2020-04-01: qty 4

## 2020-04-01 MED ORDER — SODIUM CHLORIDE 0.9 % IV SOLN: 250.0000 mL | INTRAVENOUS | Status: DC | PRN

## 2020-04-01 MED ORDER — ASPIRIN EC 81 MG PO TBEC
81.0000 mg | DELAYED_RELEASE_TABLET | Freq: Every day | ORAL | Status: DC
  Administered 2020-04-02 – 2020-04-03 (×2): 81 mg via ORAL
  Filled 2020-04-01 (×2): qty 1

## 2020-04-01 MED ORDER — ASPIRIN 81 MG PO CHEW
324.0000 mg | CHEWABLE_TABLET | Freq: Once | ORAL | Status: AC
  Administered 2020-04-01: 324 mg via ORAL
  Filled 2020-04-01: qty 4

## 2020-04-01 MED ORDER — PANTOPRAZOLE SODIUM 40 MG PO TBEC
40.0000 mg | DELAYED_RELEASE_TABLET | Freq: Every day | ORAL | Status: DC
  Administered 2020-04-02 – 2020-04-03 (×2): 40 mg via ORAL
  Filled 2020-04-01 (×2): qty 1

## 2020-04-01 MED ORDER — TACROLIMUS 1 MG PO CAPS: 1.0000 mg | ORAL_CAPSULE | Freq: Two times a day (BID) | ORAL | Status: DC

## 2020-04-01 MED ORDER — VENLAFAXINE HCL ER 75 MG PO CP24
75.0000 mg | ORAL_CAPSULE | Freq: Every day | ORAL | Status: DC
  Administered 2020-04-02 – 2020-04-03 (×2): 75 mg via ORAL
  Filled 2020-04-01: qty 1
  Filled 2020-04-01: qty 2

## 2020-04-01 MED ORDER — ATORVASTATIN CALCIUM 40 MG PO TABS
80.0000 mg | ORAL_TABLET | Freq: Every day | ORAL | Status: DC
  Administered 2020-04-02 – 2020-04-03 (×2): 80 mg via ORAL
  Filled 2020-04-01 (×2): qty 2

## 2020-04-01 MED ORDER — LORAZEPAM 1 MG PO TABS
1.0000 mg | ORAL_TABLET | Freq: Two times a day (BID) | ORAL | Status: DC | PRN
  Administered 2020-04-01 – 2020-04-03 (×4): 1 mg via ORAL
  Filled 2020-04-01 (×4): qty 1

## 2020-04-01 MED ORDER — TACROLIMUS 0.5 MG PO CAPS
2.0000 mg | ORAL_CAPSULE | Freq: Every evening | ORAL | Status: DC
  Administered 2020-04-01 – 2020-04-02 (×2): 2 mg via ORAL
  Filled 2020-04-01 (×4): qty 2

## 2020-04-01 MED ORDER — BUTALBITAL-APAP-CAFFEINE 50-325-40 MG PO TABS
1.0000 | ORAL_TABLET | Freq: Four times a day (QID) | ORAL | Status: DC | PRN
  Administered 2020-04-01: 1 via ORAL
  Filled 2020-04-01: qty 1

## 2020-04-01 MED ORDER — APIXABAN 5 MG PO TABS
5.0000 mg | ORAL_TABLET | Freq: Two times a day (BID) | ORAL | Status: DC
  Administered 2020-04-01 – 2020-04-03 (×4): 5 mg via ORAL
  Filled 2020-04-01 (×4): qty 1

## 2020-04-01 MED ORDER — ONDANSETRON HCL 4 MG/2ML IJ SOLN
4.0000 mg | Freq: Once | INTRAMUSCULAR | Status: AC
  Administered 2020-04-01: 4 mg via INTRAVENOUS
  Filled 2020-04-01: qty 2

## 2020-04-01 NOTE — ED Triage Notes (Addendum)
Patient c/o chest "tightness" with shortness of breath, dizziness, nausea, and hypertension that started on Tuesday. Denies any vomiting, fever, or coughing. Per patient hx 5 bypass surgeries. Per patient took baby aspirin this morning.

## 2020-04-01 NOTE — ED Provider Notes (Signed)
[facility] EMERGENCY DEPARTMENT Provider Note   CSN: [id number] Arrival date & time: 04/01/20  1314     History Chief Complaint  Patient presents with  . Chest Pain    Joseph Greer is a 64 y.o. male.  Joseph Greer is a 65 y.o. male with a history of recent 5 vessel CABG, MI, hypertension, diabetes, CKD, s/p liver transplant, who presents for evaluation of feeling unwell with an episode of chest pain and some shortness of breath.  He states that starting Tuesday he started feeling poorly with general malaise, no fevers or chills.  Reports he is feeling very fatigued, with some lightheadedness and shortness of breath.  He has not had any syncopal episodes.  He states that his shortness of breath is primarily with exertion.  He has had some decreased appetite as well.  Has been nauseated and had 2 episodes of vomiting over the past few days, but denies any abdominal pain.  Has had some occasional diarrhea.  He states that he was not experiencing any chest pain outside of his typical soreness with his chest after his CABG but last night did have an episode of more intense chest pressure, he states this was in the center of his chest and nonradiating, not associated with vomiting or diaphoresis.  He states that this episode of chest pain resolved and he has not had any recurrence.  He denies any lower extremity swelling.  No cough.  States this is the first time he has felt poorly since having his heart surgery and he got very worried and did not know what to do.  He has had both of his Covid vaccines and denies any known sick contacts.  No other aggravating or alleviating factors.        Past Medical History:  Diagnosis Date  . Anxiety   . CKD (chronic kidney disease), stage III ([place])   . Depression   . Diabetes mellitus type 2, noninsulin dependent ([place])   . Hepatitis C   . Hypertension   . Psoriasis   . Status post liver transplant [facility])     Patient Active Problem  List   Diagnosis Date Noted  . Malnutrition of moderate degree 12/22/2019  . S/P CABG x 5 12/19/2019  . Presence of aortocoronary bypass graft 12/19/2019  . Myocardial infarction ([place]) 12/11/2019  . Immunosuppression ([place]) 02/13/2017  . Personal history of colonic polyps 01/31/2017  . Personal history of other malignant neoplasm of skin 01/25/2017  . Hepatitis B core antibody positive 12/24/2014  . Chest pain, unspecified 04/09/2013  . Chest pain 03/26/2013  . Fatigue 08/05/2012  . Other specified postprocedural states 04/17/2012  . Hypertensive disorder 01/24/2012  . Squamous cell carcinoma of skin of face 01/24/2012  . Type 2 diabetes mellitus without complications ([place]) 65/68/1275  . Other peripheral vertigo, unspecified ear 10/03/2011  . Vertigo 09/04/2011  . Depressive disorder, not elsewhere classified 07/28/2008  . Status post liver transplantation ([place]) 01/27/2008  . Esophageal varices ([place]) 10/29/2007  . Psoriasis 10/01/2007  . Chronic viral hepatitis C ([place]) 10/01/2007    Past Surgical History:  Procedure Laterality Date  . CLIPPING OF ATRIAL APPENDAGE N/A 12/19/2019   Procedure: CLIPPING OF ATRIAL APPENDAGE USING ATRICURE CLIP SIZE 50MM;  Surgeon: Wonda Olds, MD;  Location: [place];  Service: Open Heart Surgery;  Laterality: N/A;  . COLONOSCOPY    . COLONOSCOPY WITH PROPOFOL N/A 06/01/2017   Procedure: COLONOSCOPY WITH PROPOFOL;  Surgeon: Rogene Houston, MD;  [facility];  Service: Endoscopy;  Laterality: N/A;  7:30  . CORONARY ARTERY BYPASS GRAFT N/A 12/19/2019   Procedure: CORONARY ARTERY BYPASS GRAFTING (CABG) TIMES  X 5,   USING BILATERAL MAMMARY ARTERY AND RIGHT LEG GREATER SAPHENOUS VEIN HARVESTED ENDOSCOPICALLY;  Surgeon: Wonda Olds, MD;  Location: [place];  Service: Open Heart Surgery;  Laterality: N/A;  . HERNIA REPAIR Right   . IR FLUORO GUIDE CV LINE RIGHT  12/30/2019  . IR REMOVAL TUN CV CATH W/O FL  02/12/2020  . IR US GUIDE VASC ACCESS  RIGHT  12/30/2019  . LEFT HEART CATH AND CORONARY ANGIOGRAPHY N/A 12/12/2019   Procedure: LEFT HEART CATH AND CORONARY ANGIOGRAPHY;  Surgeon: Jettie Booze, MD;  Location: [place] CV LAB;  Service: Cardiovascular;  Laterality: N/A;  . LIVER BIOPSY    . LIVER SURGERY    . MAZE N/A 12/19/2019   Procedure: MAZE;  Surgeon: Wonda Olds, MD;  Location: [place];  Service: Open Heart Surgery;  Laterality: N/A;  . POLYPECTOMY  06/01/2017   Procedure: POLYPECTOMY;  Surgeon: Rogene Houston, MD;  [facility];  Service: Endoscopy;;  polyp at sigmoid colon x2, ascending colon polyp, hepatic flexure polyp  . RIGHT HEART CATH N/A 12/17/2019   Procedure: RIGHT HEART CATH;  Surgeon: Larey Dresser, MD;  Location: [place] CV LAB;  Service: Cardiovascular;  Laterality: N/A;  . TEE WITHOUT CARDIOVERSION N/A 12/19/2019   Procedure: TRANSESOPHAGEAL ECHOCARDIOGRAM (TEE);  Surgeon: Wonda Olds, MD;  Location: [place];  Service: Open Heart Surgery;  Laterality: N/A;  . UPPER GASTROINTESTINAL ENDOSCOPY         Family History  Problem Relation Age of Onset  . Diabetes Mother   . Heart disease Father   . Heart disease Son     Social History   Tobacco Use  . Smoking status: Former Smoker    Packs/day: 1.00    Types: Cigarettes    Quit date: 09/04/2007    Years since quitting: 12.5  . Smokeless tobacco: Never Used  Vaping Use  . Vaping Use: Never used  Substance Use Topics  . Alcohol use: No  . Drug use: No    Home Medications Prior to Admission medications   Medication Sig Start Date End Date Taking? Authorizing Provider  Adalimumab 40 MG/0.8ML PSKT every 14 (fourteen) days. Patient not taking: Reported on 03/01/2020 10/01/06   [provider]  amiodarone (PACERONE) 200 MG tablet Take 1 tablet (200 mg total) by mouth daily. 01/02/20   Gold, Patrick Jupiter E, PA-C  ammonium lactate (LAC-HYDRIN) 12 % lotion Apply bid to legs for scaling 11/19/06   [provider]   apixaban (ELIQUIS) 5 MG TABS tablet Take 1 tablet (5 mg total) by mouth 2 (two) times daily. 01/01/20   Gold, Wayne E, PA-C  Apremilast (OTEZLA) 30 MG TABS Take 1 tablet by mouth 2 (two) times daily. 06/16/19   [provider]  aspirin EC 81 MG EC tablet Take 1 tablet (81 mg total) by mouth daily. Swallow whole. 01/02/20   Gold, Wayne E, PA-C  atorvastatin (LIPITOR) 80 MG tablet Take 1 tablet (80 mg total) by mouth daily. 01/02/20   Gold, Wayne E, PA-C  atorvastatin (LIPITOR) 80 MG tablet Take 1 tablet (80 mg total) by mouth daily. 03/01/20 03/01/21  Wonda Olds, MD  calcipotriene (DOVONOX) 0.005 % ointment apply to psoriasis bid on mon-fri 08/11/99   [provider]  carvedilol (COREG)  3.125 MG tablet Take 1 tablet (3.125 mg total) by mouth 2 (two) times daily with a meal. 01/01/20   Gold, Wayne E, PA-C  carvedilol (COREG) 3.125 MG tablet Take 1 tablet (3.125 mg total) by mouth 2 (two) times daily. 03/01/20 03/01/21  Wonda Olds, MD  Darbepoetin Alfa (ARANESP) 60 MCG/0.3ML SOSY injection Inject 0.3 mLs (60 mcg total) into the vein every Wednesday with hemodialysis. Patient not taking: Reported on 03/01/2020 01/07/20   John Giovanni, PA-C  Docusate Sodium (DSS) 100 MG CAPS Take by mouth.    [provider]  etanercept (ENBREL) 25 MG injection Inject into the skin. Patient not taking: Reported on 03/01/2020 10/27/09   [provider]  furosemide (LASIX) 20 MG tablet Take by mouth. 11/19/06   [provider]  halobetasol (ULTRAVATE) 0.05 % cream Apply topically. 10/01/06   [provider]  LORazepam (ATIVAN) 1 MG tablet Take 1 mg by mouth 2 (two) times daily as needed (panic attacks).  11/28/19   [provider]  meloxicam (MOBIC) 15 MG tablet Take by mouth. 04/05/18   [provider]  multivitamin (RENA-VIT) TABS tablet Take by mouth.    [provider]  multivitamin (RENA-VIT) TABS tablet Take 1 tablet by mouth daily.  01/26/20   [provider]  pantoprazole (PROTONIX) 40 MG tablet Take 1 tablet (40 mg total) by mouth daily. 01/02/20   Gold, Wayne E, PA-C  pantoprazole (PROTONIX) 40 MG tablet Take 1 tablet (40 mg total) by mouth daily. 03/01/20 03/01/21  Wonda Olds, MD  spironolactone (ALDACTONE) 25 MG tablet Take by mouth. 11/19/06   [provider]  tacrolimus (PROGRAF) 1 MG capsule Take 1 capsule (1 mg total) by mouth 2 (two) times daily. 01/01/20   Gold, Wayne E, PA-C  tacrolimus (PROGRAF) 1 MG capsule Take 1 mg by mouth. One tab in the AM (1mg ) and two tabs in the PM (2mg )    [provider]  temazepam (RESTORIL) 30 MG capsule Take 30 mg by mouth at bedtime. 01/30/20   [provider]  TEMAZEPAM PO Take by mouth.    [provider]  venlafaxine XR (EFFEXOR-XR) 75 MG 24 hr capsule Take 75 mg by mouth daily.    [provider]    Allergies    Morphine and related  Review of Systems   Review of Systems  Constitutional: Positive for appetite change and fatigue. Negative for chills and fever.  HENT: Negative.   Respiratory: Positive for shortness of breath. Negative for cough.   Cardiovascular: Positive for chest pain. Negative for palpitations and leg swelling.  Gastrointestinal: Positive for diarrhea, nausea and vomiting. Negative for abdominal pain and blood in stool.  Genitourinary: Negative for dysuria and frequency.  Musculoskeletal: Negative for arthralgias and myalgias.  Skin: Negative for color change and rash.  Neurological: Positive for light-headedness. Negative for syncope.    Physical Exam Updated Vital Signs BP (!) 156/96 (BP Location: Left Arm)   Pulse 88   Temp 97.6 F (36.4 C) (Oral)   Resp 20   Ht 6' (1.829 m)   Wt 79.4 kg   SpO2 97%   BMI 23.73 kg/m   Physical Exam Vitals and nursing note reviewed.  Constitutional:      General: He is not in acute distress.    Appearance: He is well-developed. He is ill-appearing.  He is not diaphoretic.     Comments: Alert, ill-appearing but in no acute distress  HENT:  Head: Normocephalic and atraumatic.  Eyes:     General:        Right eye: No discharge.        Left eye: No discharge.     Pupils: Pupils are equal, round, and reactive to light.  Cardiovascular:     Rate and Rhythm: Normal rate and regular rhythm.     Pulses:          Radial pulses are 2+ on the right side and 2+ on the left side.       Dorsalis pedis pulses are 2+ on the right side and 2+ on the left side.     Heart sounds: Normal heart sounds.  Pulmonary:     Effort: Pulmonary effort is normal. No respiratory distress.     Breath sounds: Rales present. No wheezing.     Comments: Respirations equal and unlabored, patient able to speak in full sentences, patient satting in low 90s on room air, lungs with some faint crackles in bases but otherwise clear to auscultation Chest:     Chest wall: No tenderness.  Abdominal:     General: Bowel sounds are normal. There is no distension.     Palpations: Abdomen is soft. There is no mass.     Tenderness: There is no abdominal tenderness. There is no guarding.     Comments: Abdomen soft, nondistended, nontender to palpation in all quadrants without guarding or peritoneal signs   Musculoskeletal:        General: No deformity.     Cervical back: Neck supple.     Right lower leg: No tenderness. No edema.     Left lower leg: No tenderness. No edema.  Skin:    General: Skin is warm and dry.     Capillary Refill: Capillary refill takes less than 2 seconds.  Neurological:     Mental Status: He is alert.     Coordination: Coordination normal.     Comments: Speech is clear, able to follow commands Moves extremities without ataxia, coordination intact  Psychiatric:        Mood and Affect: Mood normal.        Behavior: Behavior normal.     ED Results / Procedures / Treatments   Labs (all labs ordered are listed, but only abnormal results are  displayed) Labs Reviewed  CBC - Abnormal; Notable for the following components:      Result Value   RBC 3.77 (*)    Hemoglobin 10.4 (*)    HCT 33.2 (*)    RDW 16.2 (*)    All other components within normal limits  RESPIRATORY PANEL BY RT PCR (FLU A&B, COVID)  BASIC METABOLIC PANEL  HEPATIC FUNCTION PANEL  LIPASE, BLOOD  URINALYSIS, ROUTINE W REFLEX MICROSCOPIC  BRAIN NATRIURETIC PEPTIDE  TROPONIN I (HIGH SENSITIVITY)    EKG EKG Interpretation  Date/Time:  Thursday April 01 2020 13:24:38 EDT Ventricular Rate:  89 PR Interval:    QRS Duration: 138 QT Interval:  406 QTC Calculation: 494 R Axis:   -24 Text Interpretation: Sinus rhythm Short PR interval LVH with secondary repolarization abnormality Borderline prolonged QT interval Baseline wander in lead(s) II III aVR aVL aVF No significant change since last tracing Confirmed by Fredia Sorrow [phone]) on 04/01/2020 2:11:45 PM   Radiology DG Chest 2 View  Result Date: 04/01/2020 CLINICAL DATA:  Chest pain EXAM: CHEST - 2 VIEW COMPARISON:  01/15/2020 chest radiograph and prior. FINDINGS: Interval removal of tunneled right IJ CVC. Minimal  right basilar opacities with small effusion. No pneumothorax. Cardiomegaly. Postsurgical appearance of the cardiomediastinal silhouette. No acute osseous abnormality. IMPRESSION: 1. Patchy right basilar opacities and small effusion. 2. Cardiomegaly. Electronically Signed   By: Primitivo Gauze M.D.   On: 04/01/2020 14:55    Procedures Procedures (including critical care time)  Medications Ordered in ED Medications - No data to display  ED Course  I have reviewed the triage vital signs and the nursing notes.  Pertinent labs & imaging results that were available during my care of the patient were reviewed by me and considered in my medical decision making (see chart for details).    MDM Rules/Calculators/A&P                          64 year old male with history of recent 5 vessel  CABG and heart failure, who presents with 3 days of malaise, lightheadedness and shortness of breath.  Did have an episode of central chest pressure last night that has since resolved.  Also states that he has had some nausea with 2 episodes of vomiting and diarrhea but no focal abdominal pain, no urinary symptoms, no fevers or chills.  No cough.  Covid vaccinated.  Currently well-appearing and in no acute distress, mildly hypertensive but all other vitals normal.  Lungs with some faint crackles in bilateral bases.  Will check basic labs, hepatic function, lipase, troponin, and D-dimer, as well as urinalysis, Covid swab and chest x-ray.  Orthostatic vitals are not positive  I have independently ordered, reviewed and interpreted all labs and imaging: CBC: No leukocytosis, stable hemoglobin BMP: Creatinine is at baseline at 1.79, no other significant electrolyte derangements LFTs: T bili of 131, LFTs otherwise unremarkable Lipase: WNL Troponin: Initially 64, delta troponin of 66, minimal increase which is reassuring, may have baseline elevation in the setting of CKD, is currently chest pain-free BNP: 1413,, no prior baseline available for comparison UA: No signs of infection  EKG: No significant change when compared to previous tracing, sinus rhythm  Chest x-ray: Patchy basilar opacities and small effusion noted, cardiomegaly noted as well.  Concerned that this may be more so related to pulmonary edema given elevated BNP.  Patient does not have clinical symptoms suggestive of pneumonia without cough or fever.  Covid: Negative  Will discuss with cardiology, anticipate patient will require admission.  Case discussed with Dr. Radford Pax with cardiology, who agrees that findings on chest x-ray are likely some pulmonary edema given patient's elevated BNP and symptoms.  Reassured that troponins were only minimally elevated and not increasing.  Does recommend heparinizing patient until patient has another  troponin that is not increasing.  Dr. Radford Pax feels that the patient can stay at anything and be admitted by the hospitalist with cardiology to consult in the morning.  I discussed this plan with the patient, and will consult for medicine admission.  Case discussed with Dr. Waldron Labs with [facility] hospitalist who will see and admit the patient.  Notes that the patient is on Eliquis so may not need heparin.  Final Clinical Impression(s) / ED Diagnoses Final diagnoses:  Acute on chronic congestive heart failure, unspecified heart failure type [facility])    Rx / DC Orders ED Discharge Orders    None       Janet Berlin 04/01/20 1839    Fredia Sorrow, MD 04/08/20 [phone]

## 2020-04-01 NOTE — TOC Initial Note (Signed)
Transition of Care [facility]) - Initial/Assessment Note    Patient Details  Name: Joseph Greer MRN: [id number] Date of Birth: Oct 06, 1955  Transition of Care [facility]) CM/SW Contact:    Iona Beard, [place] Phone Number: 04/01/2020, 10:23 PM  Clinical Narrative:                 Pt admitted for Dyspnea and chest tightness due to acute on chronic systolic CHF. Pt states that his son lives with him. Pt is able to complete his ADLs independently and drives himself. Pt states that he has had HH in the past but is unsure of the company. Pt states that he has a rollator at home but does not typically need to use. Pt states that he plans to return home at d/c. Pt states that he will not be agreeable to SNF if recommended by PT but will be accepting of [facility] if needed.   CSW completed CHF screen with pt. Pt states that he takes all medications as prescribed. Pt states that Dr. Marlou Porch is his cardiologist. Pt has not been weighing himself daily. CSW explained the importance of daily weights and need to consult with Cardiologist if 2lb or more increase. Pt states that he tries to follow a heart healthy diet. TOC to follow.    Expected Discharge Plan: Home/Self Care Barriers to Discharge: Continued Medical Work up   Patient Goals and CMS Choice Patient states their goals for this hospitalization and ongoing recovery are:: Return home   Choice offered to / list presented to : NA  Expected Discharge Plan and Services Expected Discharge Plan: Home/Self Care In-house Referral: Clinical Social Work Discharge Planning Services: NA Post Acute Care Choice: NA Living arrangements for the past 2 months: Single Family Home                 DME Arranged: N/A DME Agency: NA       HH Arranged: NA HH Agency: NA        Prior Living Arrangements/Services Living arrangements for the past 2 months: Single Family Home Lives with:: Adult Children Patient language and need for interpreter reviewed:: Yes Do  you feel safe going back to the place where you live?: Yes            Criminal Activity/Legal Involvement Pertinent to Current Situation/Hospitalization: No - Comment as needed  Activities of Daily Living Home Assistive Devices/Equipment: Blood pressure cuff, Walker (specify type), Electric scooter ADL Screening (condition at time of admission) Patient's cognitive ability adequate to safely complete daily activities?: Yes Is the patient deaf or have difficulty hearing?: No Does the patient have difficulty seeing, even when wearing glasses/contacts?: No Does the patient have difficulty concentrating, remembering, or making decisions?: No Patient able to express need for assistance with ADLs?: Yes Does the patient have difficulty dressing or bathing?: No Independently performs ADLs?: Yes (appropriate for developmental age) Does the patient have difficulty walking or climbing stairs?: No Weakness of Legs: None Weakness of Arms/Hands: None  Permission Sought/Granted                  Emotional Assessment Appearance:: Appears stated age Attitude/Demeanor/Rapport: Engaged Affect (typically observed): Accepting Orientation: : Oriented to Self, Oriented to Place, Oriented to  Time, Oriented to Situation Alcohol / Substance Use: Not Applicable Psych Involvement: No (comment)  Admission diagnosis:  Acute on chronic systolic CHF (congestive heart failure) ([place]) [I50.23] Patient Active Problem List   Diagnosis Date Noted  . Acute on chronic  systolic CHF (congestive heart failure) ([place]) 04/01/2020  . Malnutrition of moderate degree 12/22/2019  . S/P CABG x 5 12/19/2019  . Presence of aortocoronary bypass graft 12/19/2019  . Myocardial infarction ([place]) 12/11/2019  . Immunosuppression ([place]) 02/13/2017  . Personal history of colonic polyps 01/31/2017  . Personal history of other malignant neoplasm of skin 01/25/2017  . Hepatitis B core antibody positive 12/24/2014  . Chest pain,  unspecified 04/09/2013  . Chest pain 03/26/2013  . Fatigue 08/05/2012  . Other specified postprocedural states 04/17/2012  . Hypertensive disorder 01/24/2012  . Squamous cell carcinoma of skin of face 01/24/2012  . Type 2 diabetes mellitus without complications ([place]) 84/66/5993  . Other peripheral vertigo, unspecified ear 10/03/2011  . Vertigo 09/04/2011  . Depressive disorder, not elsewhere classified 07/28/2008  . Status post liver transplantation ([place]) 01/27/2008  . Esophageal varices ([place]) 10/29/2007  . Psoriasis 10/01/2007  . Chronic viral hepatitis C ([place]) 10/01/2007   PCP:  Monico Blitz, MD Pharmacy:   [facility] [address], [place] [address] Phone: [phone] Fax: [phone]  [facility] [place] [place] - [address] [place] [address] Phone: [phone] Fax: [phone]     Social Determinants of Health (SDOH) Interventions    Readmission Risk Interventions No flowsheet data found.

## 2020-04-01 NOTE — H&P (Signed)
TRH H&P   Patient Demographics:    Joseph Greer, is a 64 y.o. male  MRN: [id number]   DOB - Oct 08, 1955  Admit Date - 04/01/2020  Outpatient Primary MD for the patient is Monico Blitz, MD   Referring MD/NP/PA: PA [place]  Outpatient Specialists: GI/liver transplant at [facility] [place] team : Dr [name], primary cardiologist Dr. Marlou Porch.  Patient coming from: Home  Chief Complaint  Patient presents with  . Chest Pain      HPI:    Joseph Greer  is a 64 y.o. male w/ h/o Hep C s/p liver transplant in 2009(followed at [facility]), Stage III CKD, HTN and T2DM, recently admitted 7/21 w/ NSTEMI and Afib w/ RVR, found to have severe 3VD and biventricular heart failure, LVEF 20-25%, RV moderately reduced. Underwent CABG + MAZE + LA appendage clipping 7/16. Required milrinone post operatively. Post op course b/c ATN/ renal failure. Required CVVHD but remained anuric. Tunneled HD cath placed and transitioned to iHD, MWF.  AKI resolved, no further requirement of hemodialysis. -Patient presents to ED secondary to complaints of heat, chest tightness and dyspnea, patient reports he has been feeling fatigued, with some lightheadedness and dyspnea, reports dyspnea mainly exertional, but recently started to have some orthopnea as well when laying flat, resolved by sitting up, as well reports poor appetite, some nausea, and couple episodes of vomiting over the last couple days, he denies any abdominal pain, reports his chest tightness has been intermittent since his surgery, but last night felt more intense, it was in the center of the chest, was nonradiating, no diaphoresis, no vomiting, no palpitation, chest pain resolved without intervention, patient reports he is vaccinated against Covid, he denies any fever or chills. - in ED patient with no hypoxia, but elevated BNP at 1413, initial troponin 64, repeat 66,  EKG nonacute, he is currently chest pain-free, LFTs within normal range, creatinine at baseline 1.79, globin 10.4, patient received IV Lasix, full dose aspirin, he is currently chest pain-free, triage hospitalist consulted to admit.     Review of systems:    In addition to the HPI above,  No Fever-chills, report weakness and fatigue No Headache, No changes with Vision or hearing, No problems swallowing food or Liquids, For chest tightness, shortness of breath, orthopnea, denies cough No Abdominal pain, he did report some nausea and vomiting, his bowel movements are regular, No Blood in stool or Urine, No dysuria, No new skin rashes or bruises, No new joints pains-aches,  No new weakness, tingling, numbness in any extremity, No recent weight gain or loss, No polyuria, polydypsia or polyphagia, No significant Mental Stressors.  A full 10 point Review of Systems was done, except as stated above, all other Review of Systems were negative.   With Past History of the following :    Past Medical History:  Diagnosis Date  . Anxiety   . CKD (chronic  kidney disease), stage III ([place])   . Depression   . Diabetes mellitus type 2, noninsulin dependent ([place])   . Hepatitis C   . Hypertension   . Psoriasis   . Status post liver transplant [facility])       Past Surgical History:  Procedure Laterality Date  . CLIPPING OF ATRIAL APPENDAGE N/A 12/19/2019   Procedure: CLIPPING OF ATRIAL APPENDAGE USING ATRICURE CLIP SIZE 50MM;  Surgeon: Wonda Olds, MD;  Location: [place];  Service: Open Heart Surgery;  Laterality: N/A;  . COLONOSCOPY    . COLONOSCOPY WITH PROPOFOL N/A 06/01/2017   Procedure: COLONOSCOPY WITH PROPOFOL;  Surgeon: Rogene Houston, MD;  [facility];  Service: Endoscopy;  Laterality: N/A;  7:30  . CORONARY ARTERY BYPASS GRAFT N/A 12/19/2019   Procedure: CORONARY ARTERY BYPASS GRAFTING (CABG) TIMES  X 5,   USING BILATERAL MAMMARY ARTERY AND RIGHT LEG GREATER SAPHENOUS  VEIN HARVESTED ENDOSCOPICALLY;  Surgeon: Wonda Olds, MD;  Location: [place];  Service: Open Heart Surgery;  Laterality: N/A;  . HERNIA REPAIR Right   . IR FLUORO GUIDE CV LINE RIGHT  12/30/2019  . IR REMOVAL TUN CV CATH W/O FL  02/12/2020  . IR US GUIDE VASC ACCESS RIGHT  12/30/2019  . LEFT HEART CATH AND CORONARY ANGIOGRAPHY N/A 12/12/2019   Procedure: LEFT HEART CATH AND CORONARY ANGIOGRAPHY;  Surgeon: Jettie Booze, MD;  Location: [place] CV LAB;  Service: Cardiovascular;  Laterality: N/A;  . LIVER BIOPSY    . LIVER SURGERY    . MAZE N/A 12/19/2019   Procedure: MAZE;  Surgeon: Wonda Olds, MD;  Location: [place];  Service: Open Heart Surgery;  Laterality: N/A;  . POLYPECTOMY  06/01/2017   Procedure: POLYPECTOMY;  Surgeon: Rogene Houston, MD;  [facility];  Service: Endoscopy;;  polyp at sigmoid colon x2, ascending colon polyp, hepatic flexure polyp  . RIGHT HEART CATH N/A 12/17/2019   Procedure: RIGHT HEART CATH;  Surgeon: Larey Dresser, MD;  Location: [place] CV LAB;  Service: Cardiovascular;  Laterality: N/A;  . TEE WITHOUT CARDIOVERSION N/A 12/19/2019   Procedure: TRANSESOPHAGEAL ECHOCARDIOGRAM (TEE);  Surgeon: Wonda Olds, MD;  Location: [place];  Service: Open Heart Surgery;  Laterality: N/A;  . UPPER GASTROINTESTINAL ENDOSCOPY        Social History:     Social History   Tobacco Use  . Smoking status: Former Smoker    Packs/day: 1.00    Types: Cigarettes    Quit date: 09/04/2007    Years since quitting: 12.5  . Smokeless tobacco: Never Used  Substance Use Topics  . Alcohol use: No       Family History :     Family History  Problem Relation Age of Onset  . Diabetes Mother   . Heart disease Father   . Heart disease Son      Home Medications:   Prior to Admission medications   Medication Sig Start Date End Date Taking? Authorizing Provider  Adalimumab 40 MG/0.8ML PSKT every 14 (fourteen) days. Patient not taking: Reported on  03/01/2020 10/01/06   [provider]  amiodarone (PACERONE) 200 MG tablet Take 1 tablet (200 mg total) by mouth daily. 01/02/20   Gold, Patrick Jupiter E, PA-C  ammonium lactate (LAC-HYDRIN) 12 % lotion Apply bid to legs for scaling 11/19/06   [provider]  apixaban (ELIQUIS) 5 MG TABS tablet Take 1 tablet (5 mg total) by mouth 2 (two)  times daily. 01/01/20   Gold, Wayne E, PA-C  Apremilast (OTEZLA) 30 MG TABS Take 1 tablet by mouth 2 (two) times daily. 06/16/19   [provider]  aspirin EC 81 MG EC tablet Take 1 tablet (81 mg total) by mouth daily. Swallow whole. 01/02/20   Gold, Wayne E, PA-C  atorvastatin (LIPITOR) 80 MG tablet Take 1 tablet (80 mg total) by mouth daily. 01/02/20   Gold, Wayne E, PA-C  atorvastatin (LIPITOR) 80 MG tablet Take 1 tablet (80 mg total) by mouth daily. 03/01/20 03/01/21  Wonda Olds, MD  calcipotriene (DOVONOX) 0.005 % ointment apply to psoriasis bid on mon-fri 08/11/99   [provider]  carvedilol (COREG) 3.125 MG tablet Take 1 tablet (3.125 mg total) by mouth 2 (two) times daily with a meal. 01/01/20   Gold, Wayne E, PA-C  carvedilol (COREG) 3.125 MG tablet Take 1 tablet (3.125 mg total) by mouth 2 (two) times daily. 03/01/20 03/01/21  Wonda Olds, MD  Darbepoetin Alfa (ARANESP) 60 MCG/0.3ML SOSY injection Inject 0.3 mLs (60 mcg total) into the vein every Wednesday with hemodialysis. Patient not taking: Reported on 03/01/2020 01/07/20   John Giovanni, PA-C  Docusate Sodium (DSS) 100 MG CAPS Take by mouth.    [provider]  etanercept (ENBREL) 25 MG injection Inject into the skin. Patient not taking: Reported on 03/01/2020 10/27/09   [provider]  furosemide (LASIX) 20 MG tablet Take by mouth. 11/19/06   [provider]  halobetasol (ULTRAVATE) 0.05 % cream Apply topically. 10/01/06   [provider]  LORazepam (ATIVAN) 1 MG tablet Take 1 mg by mouth 2 (two) times daily as needed (panic attacks).   11/28/19   [provider]  meloxicam (MOBIC) 15 MG tablet Take by mouth. 04/05/18   [provider]  multivitamin (RENA-VIT) TABS tablet Take by mouth.    [provider]  multivitamin (RENA-VIT) TABS tablet Take 1 tablet by mouth daily. 01/26/20   [provider]  pantoprazole (PROTONIX) 40 MG tablet Take 1 tablet (40 mg total) by mouth daily. 01/02/20   Gold, Wayne E, PA-C  pantoprazole (PROTONIX) 40 MG tablet Take 1 tablet (40 mg total) by mouth daily. 03/01/20 03/01/21  Wonda Olds, MD  spironolactone (ALDACTONE) 25 MG tablet Take by mouth. 11/19/06   [provider]  tacrolimus (PROGRAF) 1 MG capsule Take 1 capsule (1 mg total) by mouth 2 (two) times daily. 01/01/20   Gold, Wayne E, PA-C  tacrolimus (PROGRAF) 1 MG capsule Take 1 mg by mouth. One tab in the AM (1mg ) and two tabs in the PM (2mg )    [provider]  temazepam (RESTORIL) 30 MG capsule Take 30 mg by mouth at bedtime. 01/30/20   [provider]  TEMAZEPAM PO Take by mouth.    [provider]  venlafaxine XR (EFFEXOR-XR) 75 MG 24 hr capsule Take 75 mg by mouth daily.    [provider]     Allergies:     Allergies  Allergen Reactions  . Morphine And Related Nausea And Vomiting     Physical Exam:   Vitals  Blood pressure (!) 146/93, pulse 91, temperature 97.6 F (36.4 C), temperature source Oral, resp. rate (!) 25, height 6' (1.829 m), weight 79.4 kg, SpO2 90 %.   1. General frail male, laying in bed, no apparent distress  2. Normal affect and insight, Not Suicidal or Homicidal, Awake Alert, Oriented X 3.  3. No F.N  deficits, ALL C.Nerves Intact, Strength 5/5 all 4 extremities, Sensation intact all 4 extremities, Plantars down going.  4. Ears and Eyes appear Normal, Conjunctivae clear, PERRLA. Moist Oral Mucosa.  5. Supple Neck, No JVD, No cervical lymphadenopathy appriciated, No Carotid Bruits.  6. Symmetrical Chest wall movement,  Good air movement bilaterally, bibasilar Rales.  7. RRR, No Gallops, Rubs or Murmurs, No Parasternal Heave.  8. Positive Bowel Sounds, Abdomen Soft, No tenderness, No organomegaly appriciated,No rebound -guarding or rigidity.  9.  No Cyanosis, Normal Skin Turgor, has  psoriasis rash.  10. Good muscle tone,  joints appear normal , no effusions, Normal ROM.  11. No Palpable Lymph Nodes in Neck or Axillae    Data Review:    CBC Recent Labs  Lab 04/01/20 1354  WBC 6.3  HGB 10.4*  HCT 33.2*  PLT 223  MCV 88.1  MCH 27.6  MCHC 31.3  RDW 16.2*   ------------------------------------------------------------------------------------------------------------------  Chemistries  Recent Labs  Lab 04/01/20 1354  NA 139  K 3.9  CL 107  CO2 22  GLUCOSE 107*  BUN 19  CREATININE 1.79*  CALCIUM 8.6*  AST 16  ALT 12  ALKPHOS 82  BILITOT 1.3*   ------------------------------------------------------------------------------------------------------------------ estimated creatinine clearance is 46.4 mL/min (A) (by C-G formula based on SCr of 1.79 mg/dL (H)). ------------------------------------------------------------------------------------------------------------------ No results for input(s): TSH, T4TOTAL, T3FREE, THYROIDAB in the last 72 hours.  Invalid input(s): FREET3  Coagulation profile No results for input(s): INR, PROTIME in the last 168 hours. ------------------------------------------------------------------------------------------------------------------- No results for input(s): DDIMER in the last 72 hours. -------------------------------------------------------------------------------------------------------------------  Cardiac Enzymes No results for input(s): CKMB, TROPONINI, MYOGLOBIN in the last 168 hours.  Invalid input(s): CK ------------------------------------------------------------------------------------------------------------------    Component Value  Date/Time   BNP 1,413.0 (H) 04/01/2020 1354     ---------------------------------------------------------------------------------------------------------------  Urinalysis    Component Value Date/Time   COLORURINE YELLOW 04/01/2020 1433   APPEARANCEUR CLEAR 04/01/2020 1433   LABSPEC 1.020 04/01/2020 1433   PHURINE 5.0 04/01/2020 1433   GLUCOSEU NEGATIVE 04/01/2020 1433   HGBUR SMALL (A) 04/01/2020 1433   BILIRUBINUR NEGATIVE 04/01/2020 1433   KETONESUR 5 (A) 04/01/2020 1433   PROTEINUR >=300 (A) 04/01/2020 1433   NITRITE NEGATIVE 04/01/2020 1433   LEUKOCYTESUR NEGATIVE 04/01/2020 1433    ----------------------------------------------------------------------------------------------------------------   Imaging Results:    DG Chest 2 View  Result Date: 04/01/2020 CLINICAL DATA:  Chest pain EXAM: CHEST - 2 VIEW COMPARISON:  01/15/2020 chest radiograph and prior. FINDINGS: Interval removal of tunneled right IJ CVC. Minimal right basilar opacities with small effusion. No pneumothorax. Cardiomegaly. Postsurgical appearance of the cardiomediastinal silhouette. No acute osseous abnormality. IMPRESSION: 1. Patchy right basilar opacities and small effusion. 2. Cardiomegaly. Electronically Signed   By: Primitivo Gauze M.D.   On: 04/01/2020 14:55    My personal review of EKG: Rhythm NSR, Rate  89 /min, QTc 494 , no Acute ST changes   Assessment & Plan:    Active Problems:   Hypertensive disorder   Chest pain, unspecified   Myocardial infarction (HCC)   S/P CABG x 5   Chronic viral hepatitis C (HCC)   Immunosuppression (HCC)   Type 2 diabetes mellitus without complications ([facility])   Acute on chronic systolic CHF (congestive heart failure) ([facility])   Dyspnea and chest tightness due to acute on chronic systolic CHF -Patient with evidence of volume overload on imaging, significantly elevated BNP at 1413, chest tightness more related to dyspnea. -EKG with no acute changes, phone is  reassuring 64> 66,  will continue to trend to ensure no significant increase. -He is admitted under CHF pathway, will receive 40 mg of IV Lasix in ED, will order another 40 mg of IV Lasix in a.m., further diuresis to be determined by cardiology and renal function. -Repeat 2D echo, as per reviewing Dr. Algernon Huxley notes plan was to repeat before next visit around this time. -Cardiology will be consulted regarding further recommendation and management of volume overload, and chest pain. -Received aspirin in ED, he is currently chest pain-free. -Echo 7/21 with EF 20-25%, moderately decreased RV systolic function. Cardiac MRI with LV EF 26%, RV EF 38%, near full thickness scar in inferolateral wall suggesting lack of viability in the LCx territory. Now post-CABG. Repeat limited echo 7/20 showed LVEF 20-25%, RV normal, no pericardial effusion.  -Continue Coreg 3.125 mg bid.  - no ARB/ARNI, dig nor spiro w/ CKD per CHF team note  CAD:  -Status post CABG 7/16 . -Continue with aspirin, statin and beta-blockers . -She is full dose aspirin in ED, continue to trend troponins . -He is on Eliquis, troponins trending up will transition to Lovenox or heparin GTT .  Atrial fibrillation:   - S/p Maze + LAA clipping on 7/16.  -He is currently NSR, continue with Eliquis for anticoagulation, continue with Coreg for heart rate control.   CKD stage 3:  - Prior baseline SCr ~1.3. Creatinine rose to 6.46 post CABG. choir temporary hemodialysis, he is now off hemodialysis. -Monitor BMP closely as on IV diuresis. Type 2 diabetes:  - well controlled. Most recent Hgb A1c 5.9   Liver transplant for HCV:  - Followed at [facility]. On Tacrolimus  -Left is within normal limits  Psoriasis - continue with Otezla  Hyperlipidemia -Continue with statin    DVT Prophylaxis: on Eliquis  AM Labs Ordered, also please review Full Orders  Family Communication: Admission, patients condition and plan of care including tests  being ordered have been discussed with the patient who indicate understanding and agree with the plan and Code Status.  Code Status Full  Likely DC to  home  Condition GUARDED   Consults called: cardiology requested in EPIC    Admission status: Observation    Time spent in minutes : 60 minutes   Phillips Climes M.D on 04/01/2020 at 6:31 PM   [facility] - Office  [phone]

## 2020-04-02 ENCOUNTER — Observation Stay (HOSPITAL_COMMUNITY): Payer: Medicare HMO

## 2020-04-02 ENCOUNTER — Encounter (HOSPITAL_COMMUNITY): Payer: Self-pay | Admitting: Internal Medicine

## 2020-04-02 DIAGNOSIS — F32A Depression, unspecified: Secondary | ICD-10-CM | POA: Diagnosis present

## 2020-04-02 DIAGNOSIS — I5043 Acute on chronic combined systolic (congestive) and diastolic (congestive) heart failure: Secondary | ICD-10-CM | POA: Diagnosis not present

## 2020-04-02 DIAGNOSIS — Z85828 Personal history of other malignant neoplasm of skin: Secondary | ICD-10-CM | POA: Diagnosis not present

## 2020-04-02 DIAGNOSIS — N1832 Chronic kidney disease, stage 3b: Secondary | ICD-10-CM | POA: Diagnosis present

## 2020-04-02 DIAGNOSIS — Z791 Long term (current) use of non-steroidal anti-inflammatories (NSAID): Secondary | ICD-10-CM | POA: Diagnosis not present

## 2020-04-02 DIAGNOSIS — Z7902 Long term (current) use of antithrombotics/antiplatelets: Secondary | ICD-10-CM | POA: Diagnosis not present

## 2020-04-02 DIAGNOSIS — I2510 Atherosclerotic heart disease of native coronary artery without angina pectoris: Secondary | ICD-10-CM

## 2020-04-02 DIAGNOSIS — I10 Essential (primary) hypertension: Secondary | ICD-10-CM

## 2020-04-02 DIAGNOSIS — E785 Hyperlipidemia, unspecified: Secondary | ICD-10-CM

## 2020-04-02 DIAGNOSIS — I5021 Acute systolic (congestive) heart failure: Secondary | ICD-10-CM

## 2020-04-02 DIAGNOSIS — I4891 Unspecified atrial fibrillation: Secondary | ICD-10-CM | POA: Diagnosis not present

## 2020-04-02 DIAGNOSIS — I5082 Biventricular heart failure: Secondary | ICD-10-CM | POA: Diagnosis present

## 2020-04-02 DIAGNOSIS — I5023 Acute on chronic systolic (congestive) heart failure: Secondary | ICD-10-CM | POA: Diagnosis not present

## 2020-04-02 DIAGNOSIS — D849 Immunodeficiency, unspecified: Secondary | ICD-10-CM

## 2020-04-02 DIAGNOSIS — B182 Chronic viral hepatitis C: Secondary | ICD-10-CM | POA: Diagnosis present

## 2020-04-02 DIAGNOSIS — Z87891 Personal history of nicotine dependence: Secondary | ICD-10-CM | POA: Diagnosis not present

## 2020-04-02 DIAGNOSIS — E119 Type 2 diabetes mellitus without complications: Secondary | ICD-10-CM | POA: Diagnosis not present

## 2020-04-02 DIAGNOSIS — Z885 Allergy status to narcotic agent status: Secondary | ICD-10-CM | POA: Diagnosis not present

## 2020-04-02 DIAGNOSIS — Z951 Presence of aortocoronary bypass graft: Secondary | ICD-10-CM | POA: Diagnosis not present

## 2020-04-02 DIAGNOSIS — I252 Old myocardial infarction: Secondary | ICD-10-CM | POA: Diagnosis not present

## 2020-04-02 DIAGNOSIS — I130 Hypertensive heart and chronic kidney disease with heart failure and stage 1 through stage 4 chronic kidney disease, or unspecified chronic kidney disease: Secondary | ICD-10-CM | POA: Diagnosis present

## 2020-04-02 DIAGNOSIS — L409 Psoriasis, unspecified: Secondary | ICD-10-CM | POA: Diagnosis present

## 2020-04-02 DIAGNOSIS — R079 Chest pain, unspecified: Secondary | ICD-10-CM | POA: Diagnosis not present

## 2020-04-02 DIAGNOSIS — F419 Anxiety disorder, unspecified: Secondary | ICD-10-CM | POA: Diagnosis present

## 2020-04-02 DIAGNOSIS — I482 Chronic atrial fibrillation, unspecified: Secondary | ICD-10-CM | POA: Diagnosis present

## 2020-04-02 DIAGNOSIS — Z944 Liver transplant status: Secondary | ICD-10-CM | POA: Diagnosis not present

## 2020-04-02 DIAGNOSIS — N183 Chronic kidney disease, stage 3 unspecified: Secondary | ICD-10-CM

## 2020-04-02 DIAGNOSIS — Z833 Family history of diabetes mellitus: Secondary | ICD-10-CM | POA: Diagnosis not present

## 2020-04-02 DIAGNOSIS — Z79899 Other long term (current) drug therapy: Secondary | ICD-10-CM | POA: Diagnosis not present

## 2020-04-02 DIAGNOSIS — Z20822 Contact with and (suspected) exposure to covid-19: Secondary | ICD-10-CM | POA: Diagnosis present

## 2020-04-02 DIAGNOSIS — Z7982 Long term (current) use of aspirin: Secondary | ICD-10-CM | POA: Diagnosis not present

## 2020-04-02 DIAGNOSIS — E1122 Type 2 diabetes mellitus with diabetic chronic kidney disease: Secondary | ICD-10-CM | POA: Diagnosis present

## 2020-04-02 LAB — CBC WITH DIFFERENTIAL/PLATELET
Abs Immature Granulocytes: 0.02 10*3/uL (ref 0.00–0.07)
Basophils Absolute: 0.1 10*3/uL (ref 0.0–0.1)
Basophils Relative: 1 %
Eosinophils Absolute: 0.3 10*3/uL (ref 0.0–0.5)
Eosinophils Relative: 5 %
HCT: 32.6 % — ABNORMAL LOW (ref 39.0–52.0)
Hemoglobin: 10.1 g/dL — ABNORMAL LOW (ref 13.0–17.0)
Immature Granulocytes: 0 %
Lymphocytes Relative: 20 %
Lymphs Abs: 1.2 10*3/uL (ref 0.7–4.0)
MCH: 27.1 pg (ref 26.0–34.0)
MCHC: 31 g/dL (ref 30.0–36.0)
MCV: 87.4 fL (ref 80.0–100.0)
Monocytes Absolute: 0.6 10*3/uL (ref 0.1–1.0)
Monocytes Relative: 10 %
Neutro Abs: 3.7 10*3/uL (ref 1.7–7.7)
Neutrophils Relative %: 64 %
Platelets: 212 10*3/uL (ref 150–400)
RBC: 3.73 MIL/uL — ABNORMAL LOW (ref 4.22–5.81)
RDW: 15.9 % — ABNORMAL HIGH (ref 11.5–15.5)
WBC: 5.8 10*3/uL (ref 4.0–10.5)
nRBC: 0 % (ref 0.0–0.2)

## 2020-04-02 LAB — ECHOCARDIOGRAM COMPLETE
AR max vel: 3.7 cm2
AV Area VTI: 3.6 cm2
AV Area mean vel: 4.1 cm2
AV Mean grad: 0.9 mmHg
AV Peak grad: 1.9 mmHg
Ao pk vel: 0.69 m/s
Calc EF: 28.3 %
Height: 72 in
MV M vel: 4.26 m/s
MV Peak grad: 72.6 mmHg
P 1/2 time: 660 msec
Radius: 0.3 cm
S' Lateral: 5.39 cm
Single Plane A2C EF: 34 %
Single Plane A4C EF: 25.5 %
Weight: 2800 oz

## 2020-04-02 LAB — BASIC METABOLIC PANEL
Anion gap: 9 (ref 5–15)
BUN: 18 mg/dL (ref 8–23)
CO2: 26 mmol/L (ref 22–32)
Calcium: 8.4 mg/dL — ABNORMAL LOW (ref 8.9–10.3)
Chloride: 105 mmol/L (ref 98–111)
Creatinine, Ser: 2.02 mg/dL — ABNORMAL HIGH (ref 0.61–1.24)
GFR, Estimated: 36 mL/min — ABNORMAL LOW (ref >60)
Glucose, Bld: 91 mg/dL (ref 70–99)
Potassium: 3.9 mmol/L (ref 3.5–5.1)
Sodium: 140 mmol/L (ref 135–145)

## 2020-04-02 NOTE — Progress Notes (Signed)
PROGRESS NOTE   Joseph Greer  TMH:[id number] DOB: [birth year] DOA: 04/01/2020 PCP: Monico Blitz, MD   Chief Complaint  Patient presents with  . Chest Pain   Brief Admission History:  64 y.o. male w/ h/o Hep C s/p liver transplant in 2009(followed at [facility]), Stage III CKD, HTN and T2DM, recently admitted 7/21 w/ NSTEMI and Afib w/ RVR, found to have severe 3VD and biventricular heart failure, LVEF 20-25%, RV moderately reduced. Underwent CABG + MAZE + LA appendage clipping 7/16. Required milrinone post operatively. Post op course b/c ATN/ renal failure. Required CVVHD but remained anuric. Tunneled HD cath placed and transitioned to iHD, MWF.  AKI resolved, no further requirement of hemodialysis. -Patient presents to ED secondary to complaints of heat, chest tightness and dyspnea, patient reports he has been feeling fatigued, with some lightheadedness and dyspnea, reports dyspnea mainly exertional, but recently started to have some orthopnea as well when laying flat, resolved by sitting up, as well reports poor appetite, some nausea, and couple episodes of vomiting over the last couple days, he denies any abdominal pain, reports his chest tightness has been intermittent since his surgery, but last night felt more intense, it was in the center of the chest, was nonradiating, no diaphoresis, no vomiting, no palpitation, chest pain resolved without intervention, patient reports he is vaccinated against Covid, he denies any fever or chills. - in ED patient with no hypoxia, but elevated BNP at 1413, initial troponin 64, repeat 66, EKG nonacute, he is currently chest pain-free, LFTs within normal range, creatinine at baseline 1.79, globin 10.4, patient received IV Lasix, full dose aspirin, he is currently chest pain-free, triage hospitalist consulted to admit.  Assessment & Plan:   Active Problems:   Hypertensive disorder   Chest pain, unspecified   Myocardial infarction (HCC)   S/P CABG x 5    Chronic viral hepatitis C (HCC)   Immunosuppression (HCC)   Type 2 diabetes mellitus without complications (HCC)   Acute on chronic systolic CHF (congestive heart failure) (HCC)   Acute systolic CHF (congestive heart failure) ([place])   1. Acute systolic Heart failure - Pt reports symptomatic improvement with IV lasix diuresis.  Appreciate cardiology consult.  Follow up repeat 2D echocardiogram.  Possible DC home 10/30 on oral lasix 40 mg daily.  2. CAD - stable, resumed home aspirin, beta blocker, statin.  3. Chronic atrial fibrillation - s/p MAZE + LAA clipping 7/16.  HR remains controlled.  4. Stage 3b CKD - stable, following.  5. Type 2 DM - diet controlled.  6. S/p liver transplant for HCV - remains on tacrolimus.   7. Psoriasis - stable.  8. Hyperlipidemia - stable on statin therapy.   DVT prophylaxis: apixaban  Code Status:  Full  Family Communication: patient updated at bedside  Disposition: Home   Status is: Inpatient  Remains inpatient appropriate because:Inpatient level of care appropriate due to severity of illness  Dispo: The patient is from: Home              Anticipated d/c is to: Home              Anticipated d/c date is: 1 day              Patient currently is not medically stable to d/c.  Consultants:   Cardiology   Procedures:     Antimicrobials:    Subjective: Pt says overall he is starting to feel better, no chest discomfort.  No shortness of breath.  He has been urinating frequently since receiving lasix.    Objective: Vitals:   04/02/20 1000 04/02/20 1100 04/02/20 1104 04/02/20 1207  BP: (!) 150/95 137/80 137/80 (!) 145/88  Pulse: 86 92 91 89  Resp: (!) 24   18  Temp:    97.6 F (36.4 C)  TempSrc:      SpO2: 96% 100%  94%  Weight:    80.8 kg  Height:    6' (1.829 m)    Intake/Output Summary (Last 24 hours) at 04/02/2020 1528 Last data filed at 04/02/2020 0600 Gross per 24 hour  Intake --  Output 550 ml  Net -550 ml   Filed Weights    04/01/20 1329 04/02/20 1207  Weight: 79.4 kg 80.8 kg   Examination:  General exam: Appears calm and comfortable  Respiratory system: rare bibasilar crackles. Respiratory effort normal. Cardiovascular system: normal S1 & S2 heard. Mild JVD, trace pedal edema. Gastrointestinal system: Abdomen is nondistended, soft and nontender. No organomegaly or masses felt. Normal bowel sounds heard. Central nervous system: Alert and oriented. No focal neurological deficits. Extremities: Symmetric 5 x 5 power. Skin: No rashes, lesions or ulcers Psychiatry: Judgement and insight appear normal. Mood & affect appropriate.   Data Reviewed: I have personally reviewed following labs and imaging studies  CBC: Recent Labs  Lab 04/01/20 1354 04/02/20 0622  WBC 6.3 5.8  NEUTROABS  --  3.7  HGB 10.4* 10.1*  HCT 33.2* 32.6*  MCV 88.1 87.4  PLT 223 751    Basic Metabolic Panel: Recent Labs  Lab 04/01/20 1354 04/02/20 0622  NA 139 140  K 3.9 3.9  CL 107 105  CO2 22 26  GLUCOSE 107* 91  BUN 19 18  CREATININE 1.79* 2.02*  CALCIUM 8.6* 8.4*    GFR: Estimated Creatinine Clearance: 41.1 mL/min (A) (by C-G formula based on SCr of 2.02 mg/dL (H)).  Liver Function Tests: Recent Labs  Lab 04/01/20 1354  AST 16  ALT 12  ALKPHOS 82  BILITOT 1.3*  PROT 7.0  ALBUMIN 3.7    CBG: No results for input(s): GLUCAP in the last 168 hours.  Recent Results (from the past 240 hour(s))  Respiratory Panel by RT PCR (Flu A&B, Covid) - Nasopharyngeal Swab     Status: None   Collection Time: 04/01/20  3:03 PM   Specimen: Nasopharyngeal Swab  Result Value Ref Range Status   SARS Coronavirus 2 by RT PCR NEGATIVE NEGATIVE Final    Comment: (NOTE) SARS-CoV-2 target nucleic acids are NOT DETECTED.  The SARS-CoV-2 RNA is generally detectable in upper respiratoy specimens during the acute phase of infection. The lowest concentration of SARS-CoV-2 viral copies this assay can detect is 131 copies/mL. A  negative result does not preclude SARS-Cov-2 infection and should not be used as the sole basis for treatment or other patient management decisions. A negative result may occur with  improper specimen collection/handling, submission of specimen other than nasopharyngeal swab, presence of viral mutation(s) within the areas targeted by this assay, and inadequate number of viral copies (<131 copies/mL). A negative result must be combined with clinical observations, patient history, and epidemiological information. The expected result is Negative.  Fact Sheet for Patients:  PinkCheek.be  Fact Sheet for Healthcare Providers:  GravelBags.it  This test is no t yet approved or cleared by the [place] FDA and  has been authorized for detection and/or diagnosis of SARS-CoV-2 by FDA under an Emergency Use Authorization (EUA). This EUA will remain  in  effect (meaning this test can be used) for the duration of the COVID-19 declaration under Section 564(b)(1) of the Act, 21 U.S.C. section 360bbb-3(b)(1), unless the authorization is terminated or revoked sooner.     Influenza A by PCR NEGATIVE NEGATIVE Final   Influenza B by PCR NEGATIVE NEGATIVE Final    Comment: (NOTE) The Xpert Xpress SARS-CoV-2/FLU/RSV assay is intended as an aid in  the diagnosis of influenza from Nasopharyngeal swab specimens and  should not be used as a sole basis for treatment. Nasal washings and  aspirates are unacceptable for Xpert Xpress SARS-CoV-2/FLU/RSV  testing.  Fact Sheet for Patients: PinkCheek.be  Fact Sheet for Healthcare Providers: GravelBags.it  This test is not yet approved or cleared by the [place] FDA and  has been authorized for detection and/or diagnosis of SARS-CoV-2 by  FDA under an Emergency Use Authorization (EUA). This EUA will remain  in effect (meaning this test can  be used) for the duration of the  Covid-19 declaration under Section 564(b)(1) of the Act, 21  U.S.C. section 360bbb-3(b)(1), unless the authorization is  terminated or revoked. Performed at [facility], [address], [place], [place] [address]      Radiology Studies: DG Chest 2 View  Result Date: 04/01/2020 CLINICAL DATA:  Chest pain EXAM: CHEST - 2 VIEW COMPARISON:  01/15/2020 chest radiograph and prior. FINDINGS: Interval removal of tunneled right IJ CVC. Minimal right basilar opacities with small effusion. No pneumothorax. Cardiomegaly. Postsurgical appearance of the cardiomediastinal silhouette. No acute osseous abnormality. IMPRESSION: 1. Patchy right basilar opacities and small effusion. 2. Cardiomegaly. Electronically Signed   By: Primitivo Gauze M.D.   On: 04/01/2020 14:55   ECHOCARDIOGRAM COMPLETE  Result Date: 04/02/2020    ECHOCARDIOGRAM REPORT   Patient Name:   Joseph Greer [facility] Date of Exam: 04/02/2020 Medical Rec #:  [id number]            Height:       72.0 in Accession #:    [id number]           Weight:       175.0 lb Date of Birth:  14-Dec-1955            BSA:          2.013 m Patient Age:    50 years             BP:           130/81 mmHg Patient Gender: M                    HR:           88 bpm. Exam Location:  [facility] Procedure: 2D Echo, Cardiac Doppler and Color Doppler Indications:    CHF-Acute Systolic 885.02 / D74.12  History:        Patient has prior history of Echocardiogram examinations, most                 recent 12/23/2019. Risk Factors:Hypertension and Diabetes.  Sonographer:    Bernadene Person RDCS Referring Phys: [place]  1. Global hypokinesis with more severe inferior/inferoseptal hypokinesis. . Left ventricular ejection fraction, by estimation, is 20 to 25%. The left ventricle has severely decreased function. The left ventricle demonstrates regional wall motion abnormalities (see scoring diagram/findings for description). The left  ventricular internal cavity size was moderately dilated. There is mild left ventricular hypertrophy. Left ventricular diastolic parameters are indeterminate.  2. Right ventricular systolic  function mild to moderately reduced. The right ventricular size is normal. There is normal pulmonary artery systolic pressure.  3. Left atrial size was severely dilated.  4. The mitral valve is normal in structure. Moderate mitral valve regurgitation. No evidence of mitral stenosis.  5. The aortic valve is tricuspid. Aortic valve regurgitation is mild. No aortic stenosis is present.  6. Aortic dilatation noted. There is mild dilatation of the aortic root, measuring 40 mm. FINDINGS  Left Ventricle: Global hypokinesis with more severe inferior/inferoseptal hypokinesis. Left ventricular ejection fraction, by estimation, is 20 to 25%. The left ventricle has severely decreased function. The left ventricle demonstrates regional wall motion abnormalities. The left ventricular internal cavity size was moderately dilated. There is mild left ventricular hypertrophy. Left ventricular diastolic parameters are indeterminate. Right Ventricle: The right ventricular size is normal. No increase in right ventricular wall thickness. Right ventricular systolic function mild to moderately reduced. There is normal pulmonary artery systolic pressure. The tricuspid regurgitant velocity  is 2.26 m/s, and with an assumed right atrial pressure of 3 mmHg, the estimated right ventricular systolic pressure is 40.9 mmHg. Left Atrium: Left atrial size was severely dilated. Right Atrium: Right atrial size was normal in size. Pericardium: There is no evidence of pericardial effusion. Mitral Valve: The mitral valve is normal in structure. Moderate mitral valve regurgitation. No evidence of mitral valve stenosis. Tricuspid Valve: The tricuspid valve is normal in structure. Tricuspid valve regurgitation is mild . No evidence of tricuspid stenosis. Aortic Valve: The  aortic valve is tricuspid. Aortic valve regurgitation is mild. Aortic regurgitation PHT measures 660 msec. No aortic stenosis is present. Aortic valve mean gradient measures 0.9 mmHg. Aortic valve peak gradient measures 1.9 mmHg. Aortic  valve area, by VTI measures 3.60 cm. Pulmonic Valve: The pulmonic valve was not well visualized. Pulmonic valve regurgitation is mild. No evidence of pulmonic stenosis. Aorta: Aortic dilatation noted. There is mild dilatation of the aortic root, measuring 40 mm. Pulmonary Artery: Indeterminant PASP, inadequate TR jet. Venous: The inferior vena cava was not well visualized. IAS/Shunts: The interatrial septum was not well visualized.  LEFT VENTRICLE PLAX 2D LVIDd:         6.54 cm      Diastology LVIDs:         5.39 cm      LV e' medial:  3.38 cm/s LV PW:         1.03 cm      LV e' lateral: 3.21 cm/s LV IVS:        1.06 cm LVOT diam:     2.30 cm LV SV:         40 LV SV Index:   20 LVOT Area:     4.15 cm  LV Volumes (MOD) LV vol d, MOD A2C: 206.0 ml LV vol d, MOD A4C: 165.0 ml LV vol s, MOD A2C: 136.0 ml LV vol s, MOD A4C: 123.0 ml LV SV MOD A2C:     70.0 ml LV SV MOD A4C:     165.0 ml LV SV MOD BP:      52.4 ml RIGHT VENTRICLE RV S prime:     5.00 cm/s TAPSE (M-mode): 0.9 cm LEFT ATRIUM              Index       RIGHT ATRIUM           Index LA diam:        5.60 cm  2.78 cm/m  RA Area:  19.20 cm LA Vol (A2C):   123.0 ml 61.10 ml/m RA Volume:   52.60 ml  26.13 ml/m LA Vol (A4C):   80.8 ml  40.14 ml/m LA Biplane Vol: 110.0 ml 54.64 ml/m  AORTIC VALVE AV Area (Vmax):    3.70 cm AV Area (Vmean):   4.10 cm AV Area (VTI):     3.60 cm AV Vmax:           69.35 cm/s AV Vmean:          44.005 cm/s AV VTI:            0.110 m AV Peak Grad:      1.9 mmHg AV Mean Grad:      0.9 mmHg LVOT Vmax:         61.71 cm/s LVOT Vmean:        43.380 cm/s LVOT VTI:          0.095 m LVOT/AV VTI ratio: 0.87 AI PHT:            660 msec  AORTA Ao Root diam: 4.00 cm Ao Asc diam:  2.70 cm MR Peak grad:     72.6 mmHg   TRICUSPID VALVE MR Mean grad:    49.0 mmHg   TR Peak grad:   20.4 mmHg MR Vmax:         426.00 cm/s TR Vmax:        226.00 cm/s MR Vmean:        329.0 cm/s MR PISA:         0.57 cm    SHUNTS MR PISA Eff ROA: 5 mm       Systemic VTI:  0.10 m MR PISA Radius:  0.30 cm     Systemic Diam: 2.30 cm Carlyle Dolly MD Electronically signed by Carlyle Dolly MD Signature Date/Time: 04/02/2020/1:17:33 PM    Final    Scheduled Meds: . apixaban  5 mg Oral BID  . Apremilast  1 tablet Oral BID  . aspirin EC  81 mg Oral Daily  . atorvastatin  80 mg Oral Daily  . carvedilol  3.125 mg Oral BID WC  . pantoprazole  40 mg Oral Daily  . sodium chloride flush  3 mL Intravenous Q12H  . tacrolimus  1 mg Oral Daily   And  . tacrolimus  2 mg Oral QPM  . temazepam  30 mg Oral QHS  . venlafaxine XR  75 mg Oral Daily   Continuous Infusions: . sodium chloride      LOS: 0 days   Time spent: 33 mins   Shloimy Michalski Wynetta Emery, MD How to contact the [facility] Attending or Consulting provider [place] or covering provider during after hours [place], for this patient?  1. Check the care team in [facility] and look for a) attending/consulting [facility] provider listed and b) the [facility] team listed 2. Log into [email or web address] and use [facility]'s universal password to access. If you do not have the password, please contact the hospital operator. 3. Locate the [facility] provider you are looking for under [facility] and page to a number that you can be directly reached. 4. If you still have difficulty reaching the provider, please page the [facility] (Director on Call) for the Hospitalists listed on amion for assistance.  04/02/2020, 3:28 PM

## 2020-04-02 NOTE — Consult Note (Addendum)
Cardiology Consult    Patient ID: Joseph Greer; [id number]; [birth year]   Admit date: 04/01/2020 Date of Consult: 04/02/2020  Primary Care Provider: Monico Blitz, MD Primary Cardiologist: Joseph Furbish, MD  CHF: Dr. Aundra Greer  Patient Profile    Joseph Greer is a 64 y.o. male with past medical history of chronic combined systolic and diastolic CHF/biventricular failure (EF 20-25% by echo in 12/2019), CAD (s/p CABG on 12/19/2019 with LIMA-LAD, seq SVG-RPDA-PL, RIMA-RI and SVG-D2 with Maze and LAA clipping), HTN, HLD, acute on chronic Stage 3 CKD (required CRRT during admission in 12/2017 and initiated on HD) and history of liver transplant who is being seen today for the evaluation of CHF at the request of Joseph Greer.   History of Present Illness    Joseph Greer was admitted to [facility] in 12/2019 for an NSTEMI and atrial fibrillation with RVR. He was found to have biventricular heart failure with EF of 20 to 25% along with three-vessel CAD. He underwent CABG as outlined above but his postoperative course was complicated by acute renal failure and he required initiation of HD.   He was last examined by the Advanced Heart Failure clinic on 02/17/2020 and reported having now been off dialysis and was having good urine output. He denied any recent chest pain and was using the elliptical for exercise. Weight was stable at 182 lbs and he was continued on ASA 81 mg daily, Atorvastatin 80 mg daily, Coreg 3.125 mg twice daily, and Eliquis 5 mg twice daily. He was not on an ACE-I/ARB/ARNI given his recent renal failure. Amiodarone was discontinued given he was maintaining NSR.   He presented to [facility] ED on 04/01/2020 for evaluation of chest tightness, dyspnea, and nausea which had started 2 days prior to arrival. In talking with the patient today, he reports having worsening dyspnea on exertion, orthopnea and PND which started on Tuesday. Says he was having to sleep propped up and  was awakened throughout the night due to not being able to catch his breath. Would have associated chest tightness when this would occur. No exertional chest pain when working out (he walks on the treadmill, uses the exercise bike, elliptical for at least 30 minutes a day). Says weight had been stable on his home scales but he had a decreased appetite and reports an associated fullness along his abdomen. Says his osygen levels were in the low-90's yesterday and his diastolic BP was > 366, therefore he came to the ED for evaluation.   Initial Greer showed WBC 6.3, Hgb 10.4 (improved from 8.4 in 12/2019), platelets 223, Na+ 139, K+ 3.9 and creatinine 1.79 (at 1.87 in 02/2020). AST 16 and ALT 12. COVID negative. BNP 1413. Initial HS Troponin 64 with repeat values of 66, 64 and 71. CXR showed patchy right basilar opacity and small effusion. EKG shows NSR, HR 89 with short-PR interval and LVH. No acute ST changes when compared to prior tracings.   He was started on IV Lasix 40mg  at the time of admission. I&O's not fully recorded but he reports multiple episodes of urination.  Past Medical History:  Diagnosis Date  . Anxiety   . CAD (coronary artery disease)    a. s/p CABG on 12/19/2019 with LIMA-LAD, seq SVG-RPDA-PL, RIMA-RI and SVG-D2 with Maze and LAA clipping  . CHF (congestive heart failure) (HCC)    a. EF 20-25% by echo in 12/2019  . CKD (chronic kidney disease), stage III ([place])   . Depression   .  Diabetes mellitus type 2, noninsulin dependent ([place])   . Hepatitis C   . Hypertension   . Psoriasis   . Status post liver transplant [facility])     Past Surgical History:  Procedure Laterality Date  . CLIPPING OF ATRIAL APPENDAGE N/A 12/19/2019   Procedure: CLIPPING OF ATRIAL APPENDAGE USING ATRICURE CLIP SIZE 50MM;  Surgeon: Wonda Olds, MD;  Location: House;  Service: Open Heart Surgery;  Laterality: N/A;  . COLONOSCOPY    . COLONOSCOPY WITH PROPOFOL N/A 06/01/2017   Procedure: COLONOSCOPY WITH  PROPOFOL;  Surgeon: Rogene Houston, MD;  [facility];  Service: Endoscopy;  Laterality: N/A;  7:30  . CORONARY ARTERY BYPASS GRAFT N/A 12/19/2019   Procedure: CORONARY ARTERY BYPASS GRAFTING (CABG) TIMES  X 5,   USING BILATERAL MAMMARY ARTERY AND RIGHT LEG GREATER SAPHENOUS VEIN HARVESTED ENDOSCOPICALLY;  Surgeon: Wonda Olds, MD;  Location: [place];  Service: Open Heart Surgery;  Laterality: N/A;  . HERNIA REPAIR Right   . IR FLUORO GUIDE CV LINE RIGHT  12/30/2019  . IR REMOVAL TUN CV CATH W/O FL  02/12/2020  . IR US GUIDE VASC ACCESS RIGHT  12/30/2019  . LEFT HEART CATH AND CORONARY ANGIOGRAPHY N/A 12/12/2019   Procedure: LEFT HEART CATH AND CORONARY ANGIOGRAPHY;  Surgeon: Jettie Booze, MD;  Location: [place] CV LAB;  Service: Cardiovascular;  Laterality: N/A;  . LIVER BIOPSY    . LIVER SURGERY    . MAZE N/A 12/19/2019   Procedure: MAZE;  Surgeon: Wonda Olds, MD;  Location: [place];  Service: Open Heart Surgery;  Laterality: N/A;  . POLYPECTOMY  06/01/2017   Procedure: POLYPECTOMY;  Surgeon: Rogene Houston, MD;  [facility];  Service: Endoscopy;;  polyp at sigmoid colon x2, ascending colon polyp, hepatic flexure polyp  . RIGHT HEART CATH N/A 12/17/2019   Procedure: RIGHT HEART CATH;  Surgeon: Larey Dresser, MD;  Location: [place] CV LAB;  Service: Cardiovascular;  Laterality: N/A;  . TEE WITHOUT CARDIOVERSION N/A 12/19/2019   Procedure: TRANSESOPHAGEAL ECHOCARDIOGRAM (TEE);  Surgeon: Wonda Olds, MD;  Location: [place];  Service: Open Heart Surgery;  Laterality: N/A;  . UPPER GASTROINTESTINAL ENDOSCOPY       Home Medications:  Prior to Admission medications   Medication Sig Start Date End Date Taking? Authorizing Provider  apixaban (ELIQUIS) 5 MG TABS tablet Take 1 tablet (5 mg total) by mouth 2 (two) times daily. 01/01/20  Yes Gold, Wayne E, PA-C  Apremilast (OTEZLA) 30 MG TABS Take 1 tablet by mouth 2 (two) times daily. 06/16/19  Yes  [provider]  aspirin EC 81 MG EC tablet Take 1 tablet (81 mg total) by mouth daily. Swallow whole. 01/02/20  Yes Gold, Wayne E, PA-C  atorvastatin (LIPITOR) 80 MG tablet Take 1 tablet (80 mg total) by mouth daily. 03/01/20 03/01/21 Yes Wonda Olds, MD  carvedilol (COREG) 3.125 MG tablet Take 1 tablet (3.125 mg total) by mouth 2 (two) times daily. 03/01/20 03/01/21 Yes Atkins, Glenice Bow, MD  LORazepam (ATIVAN) 1 MG tablet Take 1 mg by mouth 2 (two) times daily as needed (panic attacks).  11/28/19  Yes [provider]  pantoprazole (PROTONIX) 40 MG tablet Take 1 tablet (40 mg total) by mouth daily. 03/01/20 03/01/21 Yes Wonda Olds, MD  tacrolimus (PROGRAF) 1 MG capsule Take 1 capsule (1 mg total) by mouth 2 (two) times daily. Patient taking differently: Take 1-2 mg by mouth  See admin instructions. 1 tablet am, 2 tablets pm 01/01/20  Yes Gold, Wayne E, PA-C  temazepam (RESTORIL) 30 MG capsule Take 30 mg by mouth at bedtime. 01/30/20  Yes [provider]  triamcinolone cream (KENALOG) 0.1 % Apply 1 application topically daily.    Yes [provider]  venlafaxine XR (EFFEXOR-XR) 75 MG 24 hr capsule Take 75 mg by mouth daily.   Yes [provider]  amiodarone (PACERONE) 200 MG tablet Take 1 tablet (200 mg total) by mouth daily. Patient not taking: Reported on 04/02/2020 01/02/20   Jadene Pierini E, PA-C  atorvastatin (LIPITOR) 80 MG tablet Take 1 tablet (80 mg total) by mouth daily. Patient not taking: Reported on 04/02/2020 01/02/20   Jadene Pierini E, PA-C  carvedilol (COREG) 3.125 MG tablet Take 1 tablet (3.125 mg total) by mouth 2 (two) times daily with a meal. Patient not taking: Reported on 04/02/2020 01/01/20   John Giovanni, PA-C  Darbepoetin Alfa (ARANESP) 60 MCG/0.3ML SOSY injection Inject 0.3 mLs (60 mcg total) into the vein every Wednesday with hemodialysis. Patient not taking: Reported on 03/01/2020 01/07/20   John Giovanni, PA-C    Inpatient  Medications: Scheduled Meds: . apixaban  5 mg Oral BID  . Apremilast  1 tablet Oral BID  . aspirin EC  81 mg Oral Daily  . atorvastatin  80 mg Oral Daily  . carvedilol  3.125 mg Oral BID WC  . furosemide  40 mg Intravenous Daily  . pantoprazole  40 mg Oral Daily  . sodium chloride flush  3 mL Intravenous Q12H  . tacrolimus  1 mg Oral Daily   And  . tacrolimus  2 mg Oral QPM  . temazepam  30 mg Oral QHS  . venlafaxine XR  75 mg Oral Daily   Continuous Infusions: . sodium chloride     PRN Meds: sodium chloride, acetaminophen, butalbital-acetaminophen-caffeine, LORazepam, ondansetron (ZOFRAN) IV, sodium chloride flush  Allergies:    Allergies  Allergen Reactions  . Morphine And Related Nausea And Vomiting    Social History:   Social History   Socioeconomic History  . Marital status: Single    Spouse name: Not on file  . Number of children: Not on file  . Years of education: Not on file  . Highest education level: Not on file  Occupational History  . Not on file  Tobacco Use  . Smoking status: Former Smoker    Packs/day: 1.00    Types: Cigarettes    Quit date: 09/04/2007    Years since quitting: 12.5  . Smokeless tobacco: Never Used  Vaping Use  . Vaping Use: Never used  Substance and Sexual Activity  . Alcohol use: No  . Drug use: No  . Sexual activity: Yes    Birth control/protection: None  Other Topics Concern  . Not on file  Social History Narrative  . Not on file   Social Determinants of Health   Financial Resource Strain:   . Difficulty of Paying Living Expenses: Not on file  Food Insecurity: No Food Insecurity  . Worried About Charity fundraiser in the Last Year: Never true  . Ran Out of Food in the Last Year: Never true  Transportation Needs: No Transportation Needs  . Lack of Transportation (Medical): No  . Lack of Transportation (Non-Medical): No  Physical Activity:   . Days of Exercise per Week: Not on file  . Minutes of Exercise per  Session: Not on file  Stress:   .  Feeling of Stress : Not on file  Social Connections:   . Frequency of Communication with Friends and Family: Not on file  . Frequency of Social Gatherings with Friends and Family: Not on file  . Attends Religious Services: Not on file  . Active Member of Clubs or Organizations: Not on file  . Attends Archivist Meetings: Not on file  . Marital Status: Not on file  Intimate Partner Violence:   . Fear of Current or Ex-Partner: Not on file  . Emotionally Abused: Not on file  . Physically Abused: Not on file  . Sexually Abused: Not on file     Family History:    Family History  Problem Relation Age of Onset  . Diabetes Mother   . Heart disease Father   . Heart disease Son       Review of Systems    General:  No chills, fever, night sweats or weight changes.  Cardiovascular: No palpitations. Positive for chest pain, dyspnea on exertion, orthopnea and PND.  Dermatological: No rash, lesions/masses Respiratory: No cough, dyspnea Urologic: No hematuria, dysuria Abdominal:   No nausea, vomiting, diarrhea, bright red blood per rectum, melena, or hematemesis Neurologic:  No visual changes, wkns, changes in mental status. All other systems reviewed and are otherwise negative except as noted above.  Physical Exam/Data    Vitals:   04/02/20 0300 04/02/20 0500 04/02/20 0600 04/02/20 0700  BP: (!) 137/94 (!) 154/100 130/81 132/81  Pulse: 79 86 83 84  Resp: 16 19 16 17   Temp:      TempSrc:      SpO2: 95% 97% 95% 95%  Weight:      Height:        Intake/Output Summary (Last 24 hours) at 04/02/2020 0919 Last data filed at 04/02/2020 0600 Gross per 24 hour  Intake --  Output 550 ml  Net -550 ml   Filed Weights   04/01/20 1329  Weight: 79.4 kg   Body mass index is 23.73 kg/m.   General: Pleasant male appearing in NAD Psych: Normal affect. Neuro: Alert and oriented X 3. Moves all extremities spontaneously. HEENT: Normal  Neck:  Supple without bruits or JVD. Lungs:  Resp regular and unlabored, mild rales along right base. Heart: RRR no s3, s4, or murmurs. Abdomen: Soft, non-tender, non-distended, BS + x 4.  Extremities: No clubbing, cyanosis or lower extremity edema. DP/PT/Radials 2+ and equal bilaterally.   EKG:  The EKG was personally reviewed and demonstrates: NSR, HR 89 with short-PR interval and LVH. No acute ST changes.   Telemetry:  Telemetry was personally reviewed and demonstrates: NSR, HR in 70's to 80's with occasional PVC's.    Greer/Studies     Relevant CV Studies:  LHC: 12/12/2019  Dist RCA lesion is 100% stenosed. Faint left to right and right to right collaterals.  Ramus lesion is 95% stenosed.  1st Diag lesion is 75% stenosed.  Prox Cx lesion is 99% stenosed. THis is the culprit lesion. TIMI 2 flow.  Ost LAD to Prox LAD lesion is 60% stenosed. Complex lesion with shelf of plaque.  Mid LAD lesion is 80% stenosed.  LV end diastolic pressure is moderately elevated.  There is no aortic valve stenosis.   Plan for cardiac surgery consult.    RHC: 12/17/2019 1. Elevated PCWP >> RA pressure.  2. Pulmonary venous hypertension.  3. Preserved cardiac output.   Limited Echo: 12/2019 IMPRESSIONS    1. Limited study for tamponade. Very difficult study  due to poor windows.  No pericardial effusion present. LVEF severely reduced with global  hypokinesis. There is severe hypokinesis of the entire lateral and  inferior walls. EF reduced to 20-25% compared  with 12/21/2019 which was likely 30-35%.  2. Left ventricular ejection fraction, by estimation, is 20 to 25%. The  left ventricle has severely decreased function.  3. The mitral valve is grossly normal. Trivial mitral valve  regurgitation. No evidence of mitral stenosis.  4. The aortic valve is grossly normal.   Laboratory Data:  Chemistry Recent Greer  Lab 04/01/20 1354 04/02/20 0622  NA 139 140  K 3.9 3.9  CL 107 105  CO2  22 26  GLUCOSE 107* 91  BUN 19 18  CREATININE 1.79* 2.02*  CALCIUM 8.6* 8.4*  GFRNONAA 42* 36*  ANIONGAP 10 9    Recent Greer  Lab 04/01/20 1354  PROT 7.0  ALBUMIN 3.7  AST 16  ALT 12  ALKPHOS 82  BILITOT 1.3*   Hematology Recent Greer  Lab 04/01/20 1354 04/02/20 0622  WBC 6.3 5.8  RBC 3.77* 3.73*  HGB 10.4* 10.1*  HCT 33.2* 32.6*  MCV 88.1 87.4  MCH 27.6 27.1  MCHC 31.3 31.0  RDW 16.2* 15.9*  PLT 223 212   Cardiac EnzymesNo results for input(s): TROPONINI in the last 168 hours. No results for input(s): TROPIPOC in the last 168 hours.  BNP Recent Greer  Lab 04/01/20 1354  BNP 1,413.0*    DDimer No results for input(s): DDIMER in the last 168 hours.  Radiology/Studies:  DG Chest 2 View  Result Date: 04/01/2020 CLINICAL DATA:  Chest pain EXAM: CHEST - 2 VIEW COMPARISON:  01/15/2020 chest radiograph and prior. FINDINGS: Interval removal of tunneled right IJ CVC. Minimal right basilar opacities with small effusion. No pneumothorax. Cardiomegaly. Postsurgical appearance of the cardiomediastinal silhouette. No acute osseous abnormality. IMPRESSION: 1. Patchy right basilar opacities and small effusion. 2. Cardiomegaly. Electronically Signed   By: Primitivo Gauze M.D.   On: 04/01/2020 14:55     Assessment & Plan    1. Acute on Chronic Combined Systolic and Diastolic CHF/Biventricular Failure - EF was reduced at 20-25% by echo in 12/2019 and he underwent revascularization as outlined above.  - He presented with a 2-day history of progressive dyspnea on exertion, orthopnea, PND and abdominal distention. BNP elevated to 1413 and CXR showed a small effusion. He was previously on HD given acute kidney failure during his recent admission but was able to stop HD last month. Was not placed on a diuretic.  - He has been started on IV Lasix 40mg  and I&O's are not fully recorded but he reports multiple episodes of urination. Given rise in creatinine, would not further titrate  diuretic therapy at this time. Suspect he will likely require a standing diuretic dose at the time of discharge. - A repeat echocardiogram has been ordered for reassessment of his EF. If this remains reduced, would consider the addition of low-dose Hydralazine/Nitrates. Not previously on ACE-I/ARB/ARNI given his CKD. Remains on Coreg 3.125mg  BID.   2. CAD/Elevated Troponin - He is s/p CABG on 12/19/2019 with LIMA-LAD, seq SVG-RPDA-PL, RIMA-RI and SVG-D2 with Maze and LAA clipping. - Has been exercising without recurrent anginal symptoms. Chest pain now resolved with diuresis. - Initial HS Troponin 64 with repeat values of 66, 64 and 71 which is most consistent with demand ischemia in the setting of CHF and not consistent with ACS.  - Continue ASA, Atorvastatin, and Coreg.   3.  HTN - BP initially elevated, improved to 132/81 on most recent check. Continue Coreg 3.125mg  BID which can be titrated pending BP trend.   4. Post-operative Atrial Fibrillation - s/p MAZE and LAA clipping at time of CABG. Maintaining NSR and no longer on Amiodarone.  - Remains on Eliquis 5mg  BID for anticoagulation.   5. HLD - FLP in 02/2020 showed his LDL had improved to 66. Continue Atorvastatin 80mg  daily.   6. Stage 3 CKD - Previously required CRRT during admission in 12/2017 and initiated on HD but was able to stop HD in 02/2020. Creatinine was at 1.79 on admission (close to prior baseline), at 2.02 today. Continue to follow with diuresis.    For questions or updates, please contact [place] Please consult [email or web address] for contact info under Cardiology/STEMI.  Signed, Erma Heritage, PA-C 04/02/2020, 9:19 AM Pager: [phone]  Attending Note Patient seen and discussed with PA Ahmed Prima, I agree with her documentation. 64 yo male history of liver transplant in 2009, stage III CKD temporarily on HD after CABG, HTN, DM2, afib, CAD with prior CABG 11/7339, chronic systolic heart failure LVEF 20-25%.  Prestns with chest tightness and dyspnea, orthopnea  K 3.9 BUN 19 Cr 1.79 BUN 19 WBC 6.3 Hgb 10.4 Plt 223 BNP 1413 hstrop 64--> 66-->64-->71 COVID neg CXR patch right basilar opacities, small effusion EKG SR, no ischemic changes 12/2019 echo LVEF 20-25%  Patient presents with acute on chronic systolic HF. Limited I/Os data thus far(reports frequent urination in ER, limited documentation likely missed output) , he received IV lasix 40mg  yesterday x 1 and also today x 1. Uptrend in Cr, though baseline looks to be around 1.8 so not a large change. F/u repeat echo.  Renal fucntion limits CHF therapy, continue his coreg. Very mild flat troponin in setting of HF not consistent with ACS, recent CABG just 3 months ago, no plans for ischemic testing. Chest pains have resolved with diuresis.   No further IV diuresis today, follow up symptoms and Greer tomorrow. Likely discharge tomorrow, would start oral lasix 40mg  daily at discharge.   Carlyle Dolly MD

## 2020-04-02 NOTE — Progress Notes (Signed)
  Echocardiogram 2D Echocardiogram has been performed.  Fidel Levy 04/02/2020, 11:04 AM

## 2020-04-02 NOTE — Plan of Care (Signed)

## 2020-04-03 DIAGNOSIS — R079 Chest pain, unspecified: Secondary | ICD-10-CM | POA: Diagnosis not present

## 2020-04-03 DIAGNOSIS — Z951 Presence of aortocoronary bypass graft: Secondary | ICD-10-CM | POA: Diagnosis not present

## 2020-04-03 DIAGNOSIS — I10 Essential (primary) hypertension: Secondary | ICD-10-CM | POA: Diagnosis not present

## 2020-04-03 DIAGNOSIS — I5023 Acute on chronic systolic (congestive) heart failure: Secondary | ICD-10-CM | POA: Diagnosis not present

## 2020-04-03 MED ORDER — FUROSEMIDE 40 MG PO TABS: 40.0000 mg | ORAL_TABLET | Freq: Every day | ORAL | 0 refills | Status: DC

## 2020-04-03 MED ORDER — TACROLIMUS 1 MG PO CAPS
1.0000 mg | ORAL_CAPSULE | ORAL | Status: DC
Start: 2020-04-03 — End: 2020-07-13

## 2020-04-03 NOTE — TOC Transition Note (Signed)
Transition of Care [facility] - CM/SW Discharge Note   Patient Details  Name: NISSAN FRAZZINI MRN: [id number] Date of Birth: 1955/11/19  Transition of [facility] CM/SW Contact:  Natasha Bence, LCSW Phone Number: 04/03/2020, 12:17 PM   Clinical Narrative:    CSW contacted by MD and Nurse for SW and RN needs. CSW placed referral with [facility] for [facility] and HHSW. Corene Cornea with [facility] agreeable to provide services. TOC signing off.   Final next level of care: [place] Barriers to Discharge: Barriers Resolved   Patient Goals and CMS Choice Patient states their goals for this hospitalization and ongoing recovery are:: Return home   Choice offered to / list presented to : NA  Discharge Placement                    Patient and family notified of of transfer: 04/03/20  Discharge Plan and Services In-house Referral: Clinical Social Work Discharge Planning Services: NA Post Acute Care Choice: NA          DME Arranged: N/A DME Agency: NA       HH Arranged: RN, Social Work [employer] Agency: Microbiologist ([place]) Date [place]: 04/03/20 Time [place]: 1216 Representative spoke with at [place]: [place] ([place]) Interventions     Readmission Risk Interventions No flowsheet data found.

## 2020-04-03 NOTE — Discharge Instructions (Signed)
HOME HEALTH NURSE TO CHECK WEIGHT AND BMP IN 1 WEEK AND REPORT RESULTS TO CARDIOLOGIST AND PRIMARY CARE PHYSICIAN.       Heart Failure Eating Plan Heart failure, also called congestive heart failure, occurs when your heart does not pump blood well enough to meet your body's needs for oxygen-rich blood. Heart failure is a long-term (chronic) condition. Living with heart failure can be challenging. However, following your health care provider's instructions about a healthy lifestyle and working with a diet and nutrition specialist (dietitian) to choose the right foods may help to improve your symptoms. What are tips for following this plan? Reading food labels  Check food labels for the amount of sodium per serving. Choose foods that have less than 140 mg (milligrams) of sodium in each serving.  Check food labels for the number of calories per serving. This is important if you need to limit your daily calorie intake to lose weight.  Check food labels for the serving size. If you eat more than one serving, you will be eating more sodium and calories than what is listed on the label.  Look for foods that are labeled as "sodium-free," "very low sodium," or "low sodium." ? Foods labeled as "reduced sodium" or "lightly salted" may still have more sodium than what is recommended for you. Cooking  Avoid adding salt when cooking. Ask your health care provider or dietitian before using salt substitutes.  Season food with salt-free seasonings, spices, or herbs. Check the label of seasoning mixes to make sure they do not contain salt.  Cook with heart-healthy oils, such as olive, canola, soybean, or sunflower oil.  Do not fry foods. Cook foods using low-fat methods, such as baking, boiling, grilling, and broiling.  Limit unhealthy fats when cooking by: ? Removing the skin from poultry, such as chicken. ? Removing all visible fats from meats. ? Skimming the fat off from stews, soups, and gravies  before serving them. Meal planning   Limit your intake of: ? Processed, canned, or pre-packaged foods. ? Foods that are high in trans fat, such as fried foods. ? Sweets, desserts, sugary drinks, and other foods with added sugar. ? Full-fat dairy products, such as whole milk.  Eat a balanced diet that includes: ? 4-5 servings of fruit each day and 4-5 servings of vegetables each day. At each meal, try to fill half of your plate with fruits and vegetables. ? Up to 6-8 servings of whole grains each day. ? Up to 2 servings of lean meat, poultry, or fish each day. One serving of meat is equal to 3 oz. This is about the same size as a deck of cards. ? 2 servings of low-fat dairy each day. ? Heart-healthy fats. Healthy fats called omega-3 fatty acids are found in foods such as flaxseed and cold-water fish like sardines, salmon, and mackerel.  Aim to eat 25-35 g (grams) of fiber a day. Foods that are high in fiber include apples, broccoli, carrots, beans, peas, and whole grains.  Do not add salt or condiments that contain salt (such as soy sauce) to foods before eating.  When eating at a restaurant, ask that your food be prepared with less salt or no salt, if possible.  Try to eat 2 or more vegetarian meals each week.  Eat more home-cooked food and eat less restaurant, buffet, and fast food. General information  Do not eat more than 2,300 mg of salt (sodium) a day. The amount of sodium that is recommended for  you may be lower, depending on your condition.  Maintain a healthy body weight as directed. Ask your health care provider what a healthy weight is for you. ? Check your weight every day. ? Work with your health care provider and dietitian to make a plan that is right for you to lose weight or maintain your current weight.  Limit how much fluid you drink. Ask your health care provider or dietitian how much fluid you can have each day.  Limit or avoid alcohol as told by your health  care provider or dietitian. Recommended foods The items listed may not be a complete list. Talk with your dietitian about what dietary choices are best for you. Fruits All fresh, frozen, and canned fruits. Dried fruits, such as raisins, prunes, and cranberries. Vegetables All fresh vegetables. Vegetables that are frozen without sauce or added salt. Low-sodium or sodium-free canned vegetables. Grains Bread with less than 80 mg of sodium per slice. Whole-wheat pasta, quinoa, and brown rice. Oats and oatmeal. Barley. [place]. Grits and cream of wheat. Whole-grain and whole-wheat cold cereal. Meats and other protein foods Lean cuts of meat. Skinless chicken and [place]. Fish with high omega-3 fatty acids, such as salmon, sardines, and other cold-water fishes. Eggs. Dried beans, peas, and edamame. Unsalted nuts and nut butters. Dairy Low-fat or nonfat (skim) milk and dried milk. Rice milk, soy milk, and almond milk. Low-fat or nonfat yogurt. Small amounts of reduced-sodium block cheese. Low-sodium cottage cheese. Fats and oils Olive, canola, soybean, flaxseed, or sunflower oil. Avocado. Sweets and desserts Apple sauce. Granola bars. Sugar-free pudding and gelatin. Frozen fruit bars. Seasoning and other foods Fresh and dried herbs. Lemon or lime juice. Vinegar. Low-sodium ketchup. Salt-free marinades, salad dressings, sauces, and seasonings. The items listed above may not be a complete list of foods and beverages you can eat. Contact a dietitian for more information. Foods to avoid The items listed may not be a complete list. Talk with your dietitian about what dietary choices are best for you. Fruits Fruits that are dried with sodium-containing preservatives. Vegetables Canned vegetables. Frozen vegetables with sauce or seasonings. Creamed vegetables. [place] fries. Onion rings. Pickled vegetables and sauerkraut. Grains Bread with more than 80 mg of sodium per slice. Hot or cold cereal with more  than 140 mg sodium per serving. Salted pretzels and crackers. Pre-packaged breadcrumbs. Bagels, croissants, and biscuits. Meats and other protein foods Ribs and chicken wings. Bacon, ham, pepperoni, bologna, salami, and packaged luncheon meats. Hot dogs, bratwurst, and sausage. Canned meat. Smoked meat and fish. Salted nuts and seeds. Dairy Whole milk, half-and-half, and cream. Buttermilk. Processed cheese, cheese spreads, and cheese curds. Regular cottage cheese. Feta cheese. Shredded cheese. String cheese. Fats and oils Butter, lard, shortening, ghee, and bacon fat. Canned and packaged gravies. Seasoning and other foods Onion salt, garlic salt, table salt, and sea salt. Marinades. Regular salad dressings. Relishes, pickles, and olives. Meat flavorings and tenderizers, and bouillon cubes. Horseradish, ketchup, and mustard. Worcestershire sauce. Teriyaki sauce, soy sauce (including reduced sodium). Hot sauce and Tabasco sauce. Steak sauce, fish sauce, oyster sauce, and cocktail sauce. Taco seasonings. Barbecue sauce. Tartar sauce. The items listed above may not be a complete list of foods and beverages you should avoid. Contact a dietitian for more information. Summary  A heart failure eating plan includes changes that limit your intake of sodium and unhealthy fat, and it may help you lose weight or maintain a healthy weight. Your health care provider may also recommend limiting how  much fluid you drink.  Most people with heart failure should eat no more than 2,300 mg of salt (sodium) a day. The amount of sodium that is recommended for you may be lower, depending on your condition.  Contact your health care provider or dietitian before making any major changes to your diet. This information is not intended to replace advice given to you by your health care provider. Make sure you discuss any questions you have with your health care provider. Document Revised: 07/18/2018 Document Reviewed:  10/06/2016 Elsevier Patient Education  [place].   Heart Failure, Self Care Heart failure is a serious condition. This document explains the things you need to do to take care of yourself after a heart failure diagnosis. You may be asked to change your diet, take certain medicines, and make other lifestyle changes in order to stay as healthy as possible. Your health care provider may also give you more specific instructions. If you have problems or questions, contact your health care provider. What are the risks? Having heart failure puts you at higher risk for certain problems. These problems can get worse if you do not take good care of yourself. Problems may include:  Blood clotting problems. This may cause a stroke.  Damage to the kidneys, liver, or lungs.  Abnormal heart rhythms. Supplies needed:  Scale for monitoring weight.  Blood pressure monitor.  Notebook.  Medicines. How to care for yourself when you have heart failure Medicines Take over-the-counter and prescription medicines only as told by your health care provider. Medicines reduce the workload of your heart, slow the progression of heart failure, and improve symptoms. Take your medicines every day.  Do not stop taking your medicine unless your health care provider tells you to do so.  Do not skip any dose of medicine.  Refill your prescriptions before you run out of medicine. Eating and drinking   Eat heart-healthy foods. Talk with a dietitian to make an eating plan that is right for you. ? Choose foods that contain no trans fat and are low in saturated fat and cholesterol. Healthy choices include fresh or frozen fruits and vegetables, fish, lean meats, legumes, fat-free or low-fat dairy products, and whole-grain or high-fiber foods. ? Limit salt (sodium) if told by your health care provider. Sodium restriction may reduce symptoms of heart failure. Ask a dietitian to recommend heart-healthy  seasonings. ? Use healthy cooking methods instead of frying. Healthy methods include roasting, grilling, broiling, baking, poaching, steaming, and stir-frying.  Limit your fluid intake, if directed by your health care provider. Fluid restriction may reduce symptoms of heart failure. Alcohol use  Do not drink alcohol if: ? Your health care provider tells you not to drink. ? Your heart was damaged by alcohol, or you have severe heart failure. ? You are pregnant, may be pregnant, or are planning to become pregnant.  If you drink alcohol: ? Limit how much you use to:  0-1 drink a day for women.  0-2 drinks a day for men. ? Be aware of how much alcohol is in your drink. In the [place] one drink equals one 12 oz bottle of beer (355 mL), one 5 oz glass of wine (148 mL), or one 1 oz glass of hard liquor (44 mL). Lifestyle   Do not use any products that contain nicotine or tobacco, such as cigarettes, e-cigarettes, and chewing tobacco. If you need help quitting, ask your health care provider. ? Do not use nicotine gum or patches  before talking to your health care provider.  Do not use illegal drugs.  Work with your health care provider to safely reach the right body weight.  Do physical activity if told by your health care provider. Talk to your health care provider before you begin an exercise if: ? You are an older adult. ? You have severe heart failure.  Learn to manage stress. If you need help to do this, ask your health care provider.  Participate in or seek rehabilitation as needed to keep or improve your independence and quality of life.  Plan rest periods when you get tired. Monitoring important information   Weigh yourself every day. This will help you to notice if too much fluid is building up in your body. ? Weigh yourself every morning after you urinate and before you eat breakfast. ? Wear the same amount of clothing each time you weigh yourself. ? Record your daily  weight. Provide your health care provider with your weight record.  Monitor and record your pulse and blood pressure as told by your health care provider. Dealing with extreme temperatures  If the weather is extremely hot: ? Avoid vigorous physical activity. ? Use air conditioning or fans, or find a cooler location. ? Avoid caffeine and alcohol. ? Wear loose-fitting, lightweight, and light-colored clothing.  If the weather is extremely cold: ? Avoid vigorous activity. ? Layer your clothes. ? Wear mittens or gloves, a hat, and a scarf when you go outside. ? Avoid alcohol. Follow these instructions at home:  Stay up to date with vaccines. Pneumococcal and flu (influenza) vaccines are especially important in preventing infections of the airways.  Keep all follow-up visits as told by your health care provider. This is important. Contact a health care provider if you:  Have a rapid weight gain.  Have increasing shortness of breath.  Are unable to participate in your usual physical activities.  Get tired easily.  Cough more than normal, especially with physical activity.  Lose your appetite or feel nauseous.  Have any swelling or more swelling in areas such as your hands, feet, ankles, or abdomen.  Are unable to sleep because it is hard to breathe.  Feel like your heart is beating quickly (palpitations).  Become dizzy or light-headed when you stand up. Get help right away if you:  Have trouble breathing.  Notice or your family notices a change in your awareness, such as having trouble staying awake or concentrating.  Have pain or discomfort in your chest.  Have an episode of fainting (syncope). These symptoms may represent a serious problem that is an emergency. Do not wait to see if the symptoms will go away. Get medical help right away. Call your local emergency services (911 in the [place] Do not drive yourself to the hospital. Summary  Heart failure is a serious  condition. To care for yourself, you may be asked to change your diet, take certain medicines, and make other lifestyle changes.  Take your medicines every day. Do not stop taking them unless your health care provider tells you to do so.  Eat heart-healthy foods, such as fresh or frozen fruits and vegetables, fish, lean meats, legumes, fat-free or low-fat dairy products, and whole-grain or high-fiber foods.  Ask your health care provider if you have any alcohol restrictions. You may have to stop drinking alcohol if you have severe heart failure.  Contact your health care provider if you notice problems, such as rapid weight gain or a fast heartbeat.  Get help right away if you faint, or have chest pain or trouble breathing. This information is not intended to replace advice given to you by your health care provider. Make sure you discuss any questions you have with your health care provider. Document Revised: 09/03/2018 Document Reviewed: 09/04/2018 Elsevier Patient Education  [place].   Heart Failure Action Plan A heart failure action plan helps you understand what to do when you have symptoms of heart failure. Follow the plan that was created by you and your health care provider. Review your plan each time you visit your health care provider. Red zone These signs and symptoms mean you should get medical help right away:  You have trouble breathing when resting.  You have a dry cough that is getting worse.  You have swelling or pain in your legs or abdomen that is getting worse.  You suddenly gain more than 2-3 lb (0.9-1.4 kg) in a day, or more than 5 lb (2.3 kg) in one week. This amount may be more or less depending on your condition.  You have trouble staying awake or you feel confused.  You have chest pain.  You do not have an appetite.  You pass out. If you experience any of these symptoms:  Call your local emergency services (911 in the [place] right away or seek  help at the emergency department of the nearest hospital. Yellow zone These signs and symptoms mean your condition may be getting worse and you should make some changes:  You have trouble breathing when you are active or you need to sleep with extra pillows.  You have swelling in your legs or abdomen.  You gain 2-3 lb (0.9-1.4 kg) in one day, or 5 lb (2.3 kg) in one week. This amount may be more or less depending on your condition.  You get tired easily.  You have trouble sleeping.  You have a dry cough. If you experience any of these symptoms:  Contact your health care provider within the next day.  Your health care provider may adjust your medicines. Green zone These signs mean you are doing well and can continue what you are doing:  You do not have shortness of breath.  You have very little swelling or no new swelling.  Your weight is stable (no gain or loss).  You have a normal activity level.  You do not have chest pain or any other new symptoms. Follow these instructions at home:  Take over-the-counter and prescription medicines only as told by your health care provider.  Weigh yourself daily. Your target weight is __________ lb (__________ kg). ? Call your health care provider if you gain more than __________ lb (__________ kg) in a day, or more than __________ lb (__________ kg) in one week.  Eat a heart-healthy diet. Work with a diet and nutrition specialist (dietitian) to create an eating plan that is best for you.  Keep all follow-up visits as told by your health care provider. This is important. Where to find more information  American Heart Association: www.heart.org Summary  Follow the action plan that was created by you and your health care provider.  Get help right away if you have any symptoms in the Red zone. This information is not intended to replace advice given to you by your health care provider. Make sure you discuss any questions you have  with your health care provider. Document Revised: 05/04/2017 Document Reviewed: 07/01/2016 Elsevier Patient Education  2020 [employer].  Living With Heart Failure  Heart failure is a long-term (chronic) condition in which the heart cannot pump enough blood through the body. When this happens, parts of the body do not get the blood and oxygen they need. There is no cure for heart failure at this time, so it is important for you to take good care of yourself and follow the treatment plan set by your health care provider. If you are living with heart failure, there are ways to help you manage the disease. Follow these instructions at home: Living with heart failure requires you to make changes in your life. Your health care team will teach you about the changes you need to make in order to relieve your symptoms and lower your risk of going to the hospital. Follow the treatment plan as set by your health care provider. Medicines Medicines are important in reducing your heart's workload, slowing the progression of heart failure, and improving your symptoms.  Take over-the-counter and prescription medicines only as told by your health care provider.  Do not stop taking your medicine unless your health care provider tells you to do that.  Do not skip any dose of your medicine.  Refill prescriptions before you run out of medicine. You need your medicines every day. Eating and drinking   Eat heart-healthy foods. Talk with a dietitian to make an eating plan that is right for you. ? If directed by your health care provider:  Limit salt (sodium). Lowering your sodium intake may reduce symptoms of heart failure. Ask a dietitian to recommend heart-healthy seasonings.  Limit your fluid intake. Fluid restriction may reduce symptoms of heart failure. ? Use low-fat cooking methods instead of frying. Low-fat methods include roasting, grilling, broiling, baking, poaching, steaming, and  stir-frying. ? Choose foods that contain no trans fat and are low in saturated fat and cholesterol. Healthy choices include fresh or frozen fruits and vegetables, fish, lean meats, legumes, fat-free or low-fat dairy products, and whole-grain or high-fiber foods.  Limit alcohol intake to no more than 1 drink a day for nonpregnant women and 2 drinks a day for men. One drink equals 12 oz of beer, 5 oz of wine, or 1 oz of hard liquor. ? Drinking more than that is harmful to your heart. Tell your health care provider if you drink alcohol several times a week. ? Talk with your health care provider about whether any level of alcohol use is safe for you. Activity   Ask your health care provider about attending cardiac rehabilitation. These programs include aerobic physical activity, which provides many benefits for your heart.  If no cardiac rehabilitation program is available, ask your health care provider what aerobic exercises are safe for you to do. Lifestyle Make the lifestyle changes recommended by your health care provider. In general:  Lose weight if your health care provider tells you to do that. Weight loss may reduce symptoms of heart failure.  Do not use any products that contain nicotine or tobacco, such as cigarettes or e-cigarettes. If you need help quitting, ask your health care provider.  Do not use street (illegal) drugs.  Return to your normal activities as told by your health care provider. Ask your health care provider what activities are safe for you. General instructions   Make sure you weigh yourself every day to track your weight. Rapid weight gain may indicate an increase in fluid in your body and may increase the workload of your heart. ? Weigh yourself every morning.  Do this after you urinate but before you eat breakfast. ? Wear the same type of clothing, without shoes, each time you weigh yourself. ? Weigh yourself on the same scale and in the same spot each  time.  Living with chronic heart failure often leads to emotions such as fear, stress, anxiety, and depression. If you feel any of these emotions and need help coping, contact your health care provider. Other ways to get help include: ? Talking to friends and family members about your condition. They can give you support and guidance. Explain your symptoms to them and, if comfortable, invite them to attend appointments or rehabilitation with you. ? Joining a support group for people with chronic heart failure. Talking with other people who have the same symptoms may give you new ways of coping with your disease and your emotions.  Stay up to date with your shots (vaccines). Staying current on pneumococcal and influenza vaccines is especially important in preventing germs from attacking your airways (respiratory infections).  Keep all follow-up visits as told by your health care provider. This is important. How to recognize changes in your condition You and your family members need to know what changes to watch for in your condition. Watch for the following changes and report them to your health care provider:  Sudden weight gain. Ask your health care provider what amount of weight gain to report.  Shortness of breath: ? Feeling short of breath while at rest, with no exercise or activity that required great effort. ? Feeling breathless with activity.  Swelling of your lower legs or ankles.  Difficulty sleeping: ? You wake up feeling short of breath. ? You have to use more pillows to raise your head in order to sleep.  Frequent, dry, hacking cough.  Loss of appetite.  Feeling more tired all the time.  Depression or feelings of sadness or hopelessness.  Bloating in the stomach. Where to find more information  Local support groups. Ask your health care provider about groups near you.  The American Heart Association: www.heart.org Contact a health care provider if:  You have a  rapid weight gain.  You have increasing shortness of breath that is unusual for you.  You are unable to participate in your usual physical activities.  You tire easily.  You cough more than normal, especially with physical activity.  You have any swelling or more swelling in areas such as your hands, feet, ankles, or abdomen.  You feel like your heart is beating quickly (palpitations).  You become dizzy or light-headed when you stand up. Get help right away if:  You have difficulty breathing.  You notice or your family notices a change in your awareness, such as having trouble staying awake or having difficulty with concentration.  You have pain or discomfort in your chest.  You have an episode of fainting (syncope). Summary  There is no cure for heart failure, so it is important for you to take good care of yourself and follow the treatment plan set by your health care provider.  Medicines are important in reducing your heart's workload, slowing the progression of heart failure, and improving your symptoms.  Living with chronic heart failure often leads to emotions such as fear, stress, anxiety, and depression. If you are feeling any of these emotions and need help coping, contact your health care provider. This information is not intended to replace advice given to you by your health care provider. Make sure you discuss any questions you  have with your health care provider. Document Revised: 05/04/2017 Document Reviewed: 10/04/2016 Elsevier Patient Education  [place].   IMPORTANT INFORMATION: PAY CLOSE ATTENTION   PHYSICIAN DISCHARGE INSTRUCTIONS  Follow with Primary care provider  Monico Blitz, MD  and other consultants as instructed by your Hospitalist Physician  [place] IF SYMPTOMS COME BACK, WORSEN OR NEW PROBLEM DEVELOPS   Please note: You were cared for by a hospitalist during your hospital stay. Every effort will  be made to forward records to your primary care provider.  You can request that your primary care provider send for your hospital records if they have not received them.  Once you are discharged, your primary care physician will handle any further medical issues. Please note that NO REFILLS for any discharge medications will be authorized once you are discharged, as it is imperative that you return to your primary care physician (or establish a relationship with a primary care physician if you do not have one) for your post hospital discharge needs so that they can reassess your need for medications and monitor your lab values.  Please get a complete blood count and chemistry panel checked by your Primary MD at your next visit, and again as instructed by your Primary MD.  Get Medicines reviewed and adjusted: Please take all your medications with you for your next visit with your Primary MD  Laboratory/radiological data: Please request your Primary MD to go over all hospital tests and procedure/radiological results at the follow up, please ask your primary care provider to get all Hospital records sent to his/her office.  In some cases, they will be blood work, cultures and biopsy results pending at the time of your discharge. Please request that your primary care provider follow up on these results.  If you are diabetic, please bring your blood sugar readings with you to your follow up appointment with primary care.    Please call and make your follow up appointments as soon as possible.    Also Note the following: If you experience worsening of your admission symptoms, develop shortness of breath, life threatening emergency, suicidal or homicidal thoughts you must seek medical attention immediately by calling 911 or calling your MD immediately  if symptoms less severe.  You must read complete instructions/literature along with all the possible adverse reactions/side effects for all the Medicines  you take and that have been prescribed to you. Take any new Medicines after you have completely understood and accpet all the possible adverse reactions/side effects.   Do not drive when taking Pain medications or sleeping medications (Benzodiazepines)  Do not take more than prescribed Pain, Sleep and Anxiety Medications. It is not advisable to combine anxiety,sleep and pain medications without talking with your primary care practitioner  Special Instructions: If you have smoked or chewed Tobacco  in the last 2 yrs please stop smoking, stop any regular Alcohol  and or any Recreational drug use.  Wear Seat belts while driving.  Do not drive if taking any narcotic, mind altering or controlled substances or recreational drugs or alcohol.

## 2020-04-03 NOTE — Progress Notes (Signed)
Patient would like information regarding a living will. Updated [place], LCSW to help with this, since pt is getting HH, orders for SW will be placed.

## 2020-04-03 NOTE — Plan of Care (Signed)

## 2020-04-03 NOTE — Discharge Summary (Signed)
Physician Discharge Summary  Joseph Greer [facility] PVX:[id number] DOB: 1955-11-05 DOA: 04/01/2020  PCP: Monico Blitz, MD Cardiologist: Dr. Marlou Porch  Admit date: 04/01/2020 Discharge date: 04/03/2020  Admitted From:  Home  Disposition:  Home with home health   Recommendations for Outpatient Follow-up:  1. Follow up with PCP in 5 days 2. Follow up with cardiology in 1-2 weeks 3. Follow up with heart failure clinic as scheduled 4. Home health RN to check BMP in 1 week and report results to PCP and cardiology  Home Health:  RN (to check BMP, weight in 1 week)  Discharge Condition: STABLE   CODE STATUS: FULL    Brief Hospitalization Summary: Please see all hospital notes, images, labs for full details of the hospitalization. ADMISSION HPI:  Joseph Greer  is a 64 y.o. male w/ h/o Hep C s/p liver transplant in 2009(followed at [facility]), Stage III CKD, HTN and T2DM, recently admitted 7/21 w/ NSTEMI and Afib w/ RVR, found to have severe 3VD and biventricular heart failure, LVEF 20-25%, RV moderately reduced. Underwent CABG + MAZE + LA appendage clipping 7/16. Required milrinone post operatively. Post op course b/c ATN/ renal failure. Required CVVHD but remained anuric. Tunneled HD cath placed and transitioned to iHD, MWF.  AKI resolved, no further requirement of hemodialysis.  -Patient presents to ED secondary to complaints of heat, chest tightness and dyspnea, patient reports he has been feeling fatigued, with some lightheadedness and dyspnea, reports dyspnea mainly exertional, but recently started to have some orthopnea as well when laying flat, resolved by sitting up, as well reports poor appetite, some nausea, and couple episodes of vomiting over the last couple days, he denies any abdominal pain, reports his chest tightness has been intermittent since his surgery, but last night felt more intense, it was in the center of the chest, was nonradiating, no diaphoresis, no vomiting, no palpitation,  chest pain resolved without intervention, patient reports he is vaccinated against Covid, he denies any fever or chills.  - in ED patient with no hypoxia, but elevated BNP at 1413, initial troponin 64, repeat 66, EKG nonacute, he is currently chest pain-free, LFTs within normal range, creatinine at baseline 1.79, globin 10.4, patient received IV Lasix, full dose aspirin, he is currently chest pain-free, triage hospitalist consulted to admit.  1. Acute systolic Heart failure - Pt reporting symptomatic improvement with IV lasix diuresis.  He received 2 doses of IV lasix 40 mg and has diuresed very well.  I/Os were not recorded accurately but his weight has come down over 3 pounds and he feels much better and he wants to go home.  Appreciate cardiology consult.  Follow up repeat 2D echocardiogram with result .  Possible DC home 10/30 on oral lasix 40 mg daily.  2. CAD - stable, resumed home aspirin, beta blocker, statin.  3. Chronic atrial fibrillation - s/p MAZE + LAA clipping 7/16.  HR remains controlled.  4. Stage 3b CKD - stable, following.  5. Type 2 DM - diet controlled.  6. S/p liver transplant for HCV - remains on tacrolimus.   7. Psoriasis - stable.  8. Hyperlipidemia - stable on statin therapy.   DVT prophylaxis: apixaban  Code Status:  Full  Family Communication: patient updated at bedside  Disposition: Home   Discharge Diagnoses:  Active Problems:   Hypertensive disorder   Chest pain, unspecified   Myocardial infarction ([place])   S/P CABG x 5   Chronic viral hepatitis C ([place])   Immunosuppression ([place])  Type 2 diabetes mellitus without complications (HCC)   Acute on chronic systolic CHF (congestive heart failure) (HCC)   Acute systolic CHF (congestive heart failure) ([place])  Discharge Instructions:  Allergies as of 04/03/2020      Reactions   Morphine And Related Nausea And Vomiting      Medication List    STOP taking these medications   amiodarone 200 MG tablet Commonly  known as: PACERONE   Darbepoetin Alfa 60 MCG/0.3ML Sosy injection Commonly known as: ARANESP     TAKE these medications   apixaban 5 MG Tabs tablet Commonly known as: ELIQUIS Take 1 tablet (5 mg total) by mouth 2 (two) times daily.   aspirin 81 MG EC tablet Take 1 tablet (81 mg total) by mouth daily. Swallow whole.   atorvastatin 80 MG tablet Commonly known as: Lipitor Take 1 tablet (80 mg total) by mouth daily. What changed: Another medication with the same name was removed. Continue taking this medication, and follow the directions you see here.   carvedilol 3.125 MG tablet Commonly known as: Coreg Take 1 tablet (3.125 mg total) by mouth 2 (two) times daily. What changed: Another medication with the same name was removed. Continue taking this medication, and follow the directions you see here.   furosemide 40 MG tablet Commonly known as: Lasix Take 1 tablet (40 mg total) by mouth daily. Start taking on: April 04, 2020   LORazepam 1 MG tablet Commonly known as: ATIVAN Take 1 mg by mouth 2 (two) times daily as needed (panic attacks).   Otezla 30 MG Tabs Generic drug: Apremilast Take 1 tablet by mouth 2 (two) times daily.   pantoprazole 40 MG tablet Commonly known as: Protonix Take 1 tablet (40 mg total) by mouth daily.   tacrolimus 1 MG capsule Commonly known as: PROGRAF Take 1-2 capsules (1-2 mg total) by mouth See admin instructions. 1 tablet am, 2 tablets pm   temazepam 30 MG capsule Commonly known as: RESTORIL Take 30 mg by mouth at bedtime.   triamcinolone cream 0.1 % Commonly known as: KENALOG Apply 1 application topically daily.   venlafaxine XR 75 MG 24 hr capsule Commonly known as: EFFEXOR-XR Take 75 mg by mouth daily.       Follow-up Information    Monico Blitz, MD. Schedule an appointment as soon as possible for a visit in 5 day(s).   Specialty: Internal Medicine Why: Hospital Follow up, Labs and weight check  Contact information: [place] [address] [phone]        Jerline Pain, MD. Schedule an appointment as soon as possible for a visit in 10 day(s).   Specialty: Cardiology Why: Hospital Follow Up  Contact information: [address] [place] [address] [phone]              Allergies  Allergen Reactions  . Morphine And Related Nausea And Vomiting   Allergies as of 04/03/2020      Reactions   Morphine And Related Nausea And Vomiting      Medication List    STOP taking these medications   amiodarone 200 MG tablet Commonly known as: PACERONE   Darbepoetin Alfa 60 MCG/0.3ML Sosy injection Commonly known as: ARANESP     TAKE these medications   apixaban 5 MG Tabs tablet Commonly known as: ELIQUIS Take 1 tablet (5 mg total) by mouth 2 (two) times daily.   aspirin 81 MG EC tablet Take 1 tablet (81 mg  total) by mouth daily. Swallow whole.   atorvastatin 80 MG tablet Commonly known as: Lipitor Take 1 tablet (80 mg total) by mouth daily. What changed: Another medication with the same name was removed. Continue taking this medication, and follow the directions you see here.   carvedilol 3.125 MG tablet Commonly known as: Coreg Take 1 tablet (3.125 mg total) by mouth 2 (two) times daily. What changed: Another medication with the same name was removed. Continue taking this medication, and follow the directions you see here.   furosemide 40 MG tablet Commonly known as: Lasix Take 1 tablet (40 mg total) by mouth daily. Start taking on: April 04, 2020   LORazepam 1 MG tablet Commonly known as: ATIVAN Take 1 mg by mouth 2 (two) times daily as needed (panic attacks).   Otezla 30 MG Tabs Generic drug: Apremilast Take 1 tablet by mouth 2 (two) times daily.   pantoprazole 40 MG tablet Commonly known as: Protonix Take 1 tablet (40 mg total) by mouth daily.   tacrolimus 1 MG capsule Commonly known as: PROGRAF Take 1-2 capsules (1-2 mg total) by  mouth See admin instructions. 1 tablet am, 2 tablets pm   temazepam 30 MG capsule Commonly known as: RESTORIL Take 30 mg by mouth at bedtime.   triamcinolone cream 0.1 % Commonly known as: KENALOG Apply 1 application topically daily.   venlafaxine XR 75 MG 24 hr capsule Commonly known as: EFFEXOR-XR Take 75 mg by mouth daily.      Procedures/Studies: DG Chest 2 View  Result Date: 04/01/2020 CLINICAL DATA:  Chest pain EXAM: CHEST - 2 VIEW COMPARISON:  01/15/2020 chest radiograph and prior. FINDINGS: Interval removal of tunneled right IJ CVC. Minimal right basilar opacities with small effusion. No pneumothorax. Cardiomegaly. Postsurgical appearance of the cardiomediastinal silhouette. No acute osseous abnormality. IMPRESSION: 1. Patchy right basilar opacities and small effusion. 2. Cardiomegaly. Electronically Signed   By: Primitivo Gauze M.D.   On: 04/01/2020 14:55   ECHOCARDIOGRAM COMPLETE  Result Date: 04/02/2020    ECHOCARDIOGRAM REPORT   Patient Name:   Joseph Greer [facility] Date of Exam: 04/02/2020 Medical Rec #:  [id number]            Height:       72.0 in Accession #:    [id number]           Weight:       175.0 lb Date of Birth:  02-13-1956            BSA:          2.013 m Patient Age:    64 years             BP:           130/81 mmHg Patient Gender: M                    HR:           88 bpm. Exam Location:  [facility] Procedure: 2D Echo, Cardiac Doppler and Color Doppler Indications:    CHF-Acute Systolic 163.84 / T36.46  History:        Patient has prior history of Echocardiogram examinations, most                 recent 12/23/2019. Risk Factors:Hypertension and Diabetes.  Sonographer:    Bernadene Person RDCS Referring Phys: [place]  1. Global hypokinesis with more severe inferior/inferoseptal hypokinesis. . Left ventricular ejection fraction, by estimation,  is 20 to 25%. The left ventricle has severely decreased function. The left ventricle demonstrates  regional wall motion abnormalities (see scoring diagram/findings for description). The left ventricular internal cavity size was moderately dilated. There is mild left ventricular hypertrophy. Left ventricular diastolic parameters are indeterminate.  2. Right ventricular systolic function mild to moderately reduced. The right ventricular size is normal. There is normal pulmonary artery systolic pressure.  3. Left atrial size was severely dilated.  4. The mitral valve is normal in structure. Moderate mitral valve regurgitation. No evidence of mitral stenosis.  5. The aortic valve is tricuspid. Aortic valve regurgitation is mild. No aortic stenosis is present.  6. Aortic dilatation noted. There is mild dilatation of the aortic root, measuring 40 mm. FINDINGS  Left Ventricle: Global hypokinesis with more severe inferior/inferoseptal hypokinesis. Left ventricular ejection fraction, by estimation, is 20 to 25%. The left ventricle has severely decreased function. The left ventricle demonstrates regional wall motion abnormalities. The left ventricular internal cavity size was moderately dilated. There is mild left ventricular hypertrophy. Left ventricular diastolic parameters are indeterminate. Right Ventricle: The right ventricular size is normal. No increase in right ventricular wall thickness. Right ventricular systolic function mild to moderately reduced. There is normal pulmonary artery systolic pressure. The tricuspid regurgitant velocity  is 2.26 m/s, and with an assumed right atrial pressure of 3 mmHg, the estimated right ventricular systolic pressure is 19.4 mmHg. Left Atrium: Left atrial size was severely dilated. Right Atrium: Right atrial size was normal in size. Pericardium: There is no evidence of pericardial effusion. Mitral Valve: The mitral valve is normal in structure. Moderate mitral valve regurgitation. No evidence of mitral valve stenosis. Tricuspid Valve: The tricuspid valve is normal in structure.  Tricuspid valve regurgitation is mild . No evidence of tricuspid stenosis. Aortic Valve: The aortic valve is tricuspid. Aortic valve regurgitation is mild. Aortic regurgitation PHT measures 660 msec. No aortic stenosis is present. Aortic valve mean gradient measures 0.9 mmHg. Aortic valve peak gradient measures 1.9 mmHg. Aortic  valve area, by VTI measures 3.60 cm. Pulmonic Valve: The pulmonic valve was not well visualized. Pulmonic valve regurgitation is mild. No evidence of pulmonic stenosis. Aorta: Aortic dilatation noted. There is mild dilatation of the aortic root, measuring 40 mm. Pulmonary Artery: Indeterminant PASP, inadequate TR jet. Venous: The inferior vena cava was not well visualized. IAS/Shunts: The interatrial septum was not well visualized.  LEFT VENTRICLE PLAX 2D LVIDd:         6.54 cm      Diastology LVIDs:         5.39 cm      LV e' medial:  3.38 cm/s LV PW:         1.03 cm      LV e' lateral: 3.21 cm/s LV IVS:        1.06 cm LVOT diam:     2.30 cm LV SV:         40 LV SV Index:   20 LVOT Area:     4.15 cm  LV Volumes (MOD) LV vol d, MOD A2C: 206.0 ml LV vol d, MOD A4C: 165.0 ml LV vol s, MOD A2C: 136.0 ml LV vol s, MOD A4C: 123.0 ml LV SV MOD A2C:     70.0 ml LV SV MOD A4C:     165.0 ml LV SV MOD BP:      52.4 ml RIGHT VENTRICLE RV S prime:     5.00 cm/s TAPSE (M-mode): 0.9 cm LEFT  ATRIUM              Index       RIGHT ATRIUM           Index LA diam:        5.60 cm  2.78 cm/m  RA Area:     19.20 cm LA Vol (A2C):   123.0 ml 61.10 ml/m RA Volume:   52.60 ml  26.13 ml/m LA Vol (A4C):   80.8 ml  40.14 ml/m LA Biplane Vol: 110.0 ml 54.64 ml/m  AORTIC VALVE AV Area (Vmax):    3.70 cm AV Area (Vmean):   4.10 cm AV Area (VTI):     3.60 cm AV Vmax:           69.35 cm/s AV Vmean:          44.005 cm/s AV VTI:            0.110 m AV Peak Grad:      1.9 mmHg AV Mean Grad:      0.9 mmHg LVOT Vmax:         61.71 cm/s LVOT Vmean:        43.380 cm/s LVOT VTI:          0.095 m LVOT/AV VTI ratio: 0.87 AI  PHT:            660 msec  AORTA Ao Root diam: 4.00 cm Ao Asc diam:  2.70 cm MR Peak grad:    72.6 mmHg   TRICUSPID VALVE MR Mean grad:    49.0 mmHg   TR Peak grad:   20.4 mmHg MR Vmax:         426.00 cm/s TR Vmax:        226.00 cm/s MR Vmean:        329.0 cm/s MR PISA:         0.57 cm    SHUNTS MR PISA Eff ROA: 5 mm       Systemic VTI:  0.10 m MR PISA Radius:  0.30 cm     Systemic Diam: 2.30 cm Carlyle Dolly MD Electronically signed by Carlyle Dolly MD Signature Date/Time: 04/02/2020/1:17:33 PM    Final       Subjective: Pt reports that he feel much better, he has been urinating frequently.  I/Os recorded are not accurate.  He weight is down more than 3 pounds.  He wants to go home.     Discharge Exam: Vitals:   04/03/20 0000 04/03/20 0400  BP: 121/74 133/84  Pulse: 80 80  Resp: 18 18  Temp: 97.9 F (36.6 C) 98.2 F (36.8 C)  SpO2: 93% 96%   Vitals:   04/02/20 1624 04/02/20 2014 04/03/20 0000 04/03/20 0400  BP: 128/89 132/80 121/74 133/84  Pulse: 80 80 80 80  Resp: 19 18 18 18   Temp: 97.7 F (36.5 C) 98 F (36.7 C) 97.9 F (36.6 C) 98.2 F (36.8 C)  TempSrc: Oral     SpO2: 90% 91% 93% 96%  Weight:    79.1 kg  Height:       General: Pt is alert, awake, not in acute distress Cardiovascular:  normal S1/S2 +, no rubs, no gallops, mild JVD. Respiratory: CTA bilaterally, no wheezing, no rhonchi, no increased work of breathing Abdominal: Soft, NT, ND, bowel sounds + Extremities: trace pretibial edema, no cyanosis   The results of significant diagnostics from this hospitalization (including imaging, microbiology, ancillary and laboratory) are listed below for reference.  Microbiology: Recent Results (from the past 240 hour(s))  Respiratory Panel by RT PCR (Flu A&B, Covid) - Nasopharyngeal Swab     Status: None   Collection Time: 04/01/20  3:03 PM   Specimen: Nasopharyngeal Swab  Result Value Ref Range Status   SARS Coronavirus 2 by RT PCR NEGATIVE NEGATIVE Final     Comment: (NOTE) SARS-CoV-2 target nucleic acids are NOT DETECTED.  The SARS-CoV-2 RNA is generally detectable in upper respiratoy specimens during the acute phase of infection. The lowest concentration of SARS-CoV-2 viral copies this assay can detect is 131 copies/mL. A negative result does not preclude SARS-Cov-2 infection and should not be used as the sole basis for treatment or other patient management decisions. A negative result may occur with  improper specimen collection/handling, submission of specimen other than nasopharyngeal swab, presence of viral mutation(s) within the areas targeted by this assay, and inadequate number of viral copies (<131 copies/mL). A negative result must be combined with clinical observations, patient history, and epidemiological information. The expected result is Negative.  Fact Sheet for Patients:  PinkCheek.be  Fact Sheet for Healthcare Providers:  GravelBags.it  This test is no t yet approved or cleared by the [place] FDA and  has been authorized for detection and/or diagnosis of SARS-CoV-2 by FDA under an Emergency Use Authorization (EUA). This EUA will remain  in effect (meaning this test can be used) for the duration of the COVID-19 declaration under Section 564(b)(1) of the Act, 21 U.S.C. section 360bbb-3(b)(1), unless the authorization is terminated or revoked sooner.     Influenza A by PCR NEGATIVE NEGATIVE Final   Influenza B by PCR NEGATIVE NEGATIVE Final    Comment: (NOTE) The Xpert Xpress SARS-CoV-2/FLU/RSV assay is intended as an aid in  the diagnosis of influenza from Nasopharyngeal swab specimens and  should not be used as a sole basis for treatment. Nasal washings and  aspirates are unacceptable for Xpert Xpress SARS-CoV-2/FLU/RSV  testing.  Fact Sheet for Patients: PinkCheek.be  Fact Sheet for Healthcare  Providers: GravelBags.it  This test is not yet approved or cleared by the [place] FDA and  has been authorized for detection and/or diagnosis of SARS-CoV-2 by  FDA under an Emergency Use Authorization (EUA). This EUA will remain  in effect (meaning this test can be used) for the duration of the  Covid-19 declaration under Section 564(b)(1) of the Act, 21  U.S.C. section 360bbb-3(b)(1), unless the authorization is  terminated or revoked. Performed at [facility], [address] [place], [place] [address]      Labs: BNP (last 3 results) Recent Labs    04/01/20 1354  BNP 2,355.7*   Basic Metabolic Panel: Recent Labs  Lab 04/01/20 1354 04/02/20 0622  NA 139 140  K 3.9 3.9  CL 107 105  CO2 22 26  GLUCOSE 107* 91  BUN 19 18  CREATININE 1.79* 2.02*  CALCIUM 8.6* 8.4*   Liver Function Tests: Recent Labs  Lab 04/01/20 1354  AST 16  ALT 12  ALKPHOS 82  BILITOT 1.3*  PROT 7.0  ALBUMIN 3.7   Recent Labs  Lab 04/01/20 1354  LIPASE 17   No results for input(s): AMMONIA in the last 168 hours. CBC: Recent Labs  Lab 04/01/20 1354 04/02/20 0622  WBC 6.3 5.8  NEUTROABS  --  3.7  HGB 10.4* 10.1*  HCT 33.2* 32.6*  MCV 88.1 87.4  PLT 223 212   Cardiac Enzymes: No results for input(s): CKTOTAL, CKMB, CKMBINDEX, TROPONINI in  the last 168 hours. BNP: Invalid input(s): POCBNP CBG: No results for input(s): GLUCAP in the last 168 hours. D-Dimer No results for input(s): DDIMER in the last 72 hours. Hgb A1c No results for input(s): HGBA1C in the last 72 hours. Lipid Profile No results for input(s): CHOL, HDL, LDLCALC, TRIG, CHOLHDL, LDLDIRECT in the last 72 hours. Thyroid function studies No results for input(s): TSH, T4TOTAL, T3FREE, THYROIDAB in the last 72 hours.  Invalid input(s): FREET3 Anemia work up No results for input(s): VITAMINB12, FOLATE, FERRITIN, TIBC, IRON, RETICCTPCT in the last 72 hours. Urinalysis    Component  Value Date/Time   COLORURINE YELLOW 04/01/2020 1433   APPEARANCEUR CLEAR 04/01/2020 1433   LABSPEC 1.020 04/01/2020 1433   PHURINE 5.0 04/01/2020 1433   GLUCOSEU NEGATIVE 04/01/2020 1433   HGBUR SMALL (A) 04/01/2020 1433   BILIRUBINUR NEGATIVE 04/01/2020 1433   KETONESUR 5 (A) 04/01/2020 1433   PROTEINUR >=300 (A) 04/01/2020 1433   NITRITE NEGATIVE 04/01/2020 1433   LEUKOCYTESUR NEGATIVE 04/01/2020 1433   Sepsis Labs Invalid input(s): PROCALCITONIN,  WBC,  LACTICIDVEN Microbiology Recent Results (from the past 240 hour(s))  Respiratory Panel by RT PCR (Flu A&B, Covid) - Nasopharyngeal Swab     Status: None   Collection Time: 04/01/20  3:03 PM   Specimen: Nasopharyngeal Swab  Result Value Ref Range Status   SARS Coronavirus 2 by RT PCR NEGATIVE NEGATIVE Final    Comment: (NOTE) SARS-CoV-2 target nucleic acids are NOT DETECTED.  The SARS-CoV-2 RNA is generally detectable in upper respiratoy specimens during the acute phase of infection. The lowest concentration of SARS-CoV-2 viral copies this assay can detect is 131 copies/mL. A negative result does not preclude SARS-Cov-2 infection and should not be used as the sole basis for treatment or other patient management decisions. A negative result may occur with  improper specimen collection/handling, submission of specimen other than nasopharyngeal swab, presence of viral mutation(s) within the areas targeted by this assay, and inadequate number of viral copies (<131 copies/mL). A negative result must be combined with clinical observations, patient history, and epidemiological information. The expected result is Negative.  Fact Sheet for Patients:  PinkCheek.be  Fact Sheet for Healthcare Providers:  GravelBags.it  This test is no t yet approved or cleared by the [place] FDA and  has been authorized for detection and/or diagnosis of SARS-CoV-2 by FDA under an  Emergency Use Authorization (EUA). This EUA will remain  in effect (meaning this test can be used) for the duration of the COVID-19 declaration under Section 564(b)(1) of the Act, 21 U.S.C. section 360bbb-3(b)(1), unless the authorization is terminated or revoked sooner.     Influenza A by PCR NEGATIVE NEGATIVE Final   Influenza B by PCR NEGATIVE NEGATIVE Final    Comment: (NOTE) The Xpert Xpress SARS-CoV-2/FLU/RSV assay is intended as an aid in  the diagnosis of influenza from Nasopharyngeal swab specimens and  should not be used as a sole basis for treatment. Nasal washings and  aspirates are unacceptable for Xpert Xpress SARS-CoV-2/FLU/RSV  testing.  Fact Sheet for Patients: PinkCheek.be  Fact Sheet for Healthcare Providers: GravelBags.it  This test is not yet approved or cleared by the [place] FDA and  has been authorized for detection and/or diagnosis of SARS-CoV-2 by  FDA under an Emergency Use Authorization (EUA). This EUA will remain  in effect (meaning this test can be used) for the duration of the  Covid-19 declaration under Section 564(b)(1) of the Act, 21  U.S.C. section 360bbb-3(b)(1),  unless the authorization is  terminated or revoked. Performed at [facility], [address] [place], [place] [address]    Time coordinating discharge: [place]  SIGNED:  Irwin Brakeman, MD  [facility] 04/03/2020, 9:57 AM How to contact the [facility] Attending or Consulting provider [place] or covering provider during after hours [place], for this patient?  1. Check the care team in [facility] and look for a) attending/consulting [facility] provider listed and b) the [facility] team listed 2. Log into [email or web address] and use [place] universal password to access. If you do not have the password, please contact the hospital operator. 3. Locate the [facility] provider you are looking for under [facility] and page to a number that  you can be directly reached. 4. If you still have difficulty reaching the provider, please page the [facility] (Director on Call) for the Hospitalists listed on amion for assistance.

## 2020-04-03 NOTE — Plan of Care (Signed)
  Problem: Education: Goal: Knowledge of General Education information will improve Description: Including pain rating scale, medication(s)/side effects and non-pharmacologic comfort measures 04/03/2020 1018 by Cameron Ali, RN Outcome: Completed/Met 04/03/2020 0833 by Cameron Ali, RN Outcome: Progressing   Problem: Health Behavior/Discharge Planning: Goal: Ability to manage health-related needs will improve 04/03/2020 1018 by Cameron Ali, RN Outcome: Completed/Met 04/03/2020 0833 by Cameron Ali, RN Outcome: Progressing   Problem: Clinical Measurements: Goal: Ability to maintain clinical measurements within normal limits will improve 04/03/2020 1018 by Cameron Ali, RN Outcome: Completed/Met 04/03/2020 0833 by Cameron Ali, RN Outcome: Progressing Goal: Will remain free from infection 04/03/2020 1018 by Cameron Ali, RN Outcome: Completed/Met 04/03/2020 7837 by Cameron Ali, RN Outcome: Progressing Goal: Diagnostic test results will improve 04/03/2020 1018 by Cameron Ali, RN Outcome: Completed/Met 04/03/2020 5423 by Cameron Ali, RN Outcome: Progressing Goal: Respiratory complications will improve 04/03/2020 1018 by Cameron Ali, RN Outcome: Completed/Met 04/03/2020 7023 by Cameron Ali, RN Outcome: Progressing Goal: Cardiovascular complication will be avoided 04/03/2020 1018 by Cameron Ali, RN Outcome: Completed/Met 04/03/2020 0172 by Cameron Ali, RN Outcome: Progressing   Problem: Activity: Goal: Risk for activity intolerance will decrease 04/03/2020 1018 by Cameron Ali, RN Outcome: Completed/Met 04/03/2020 0910 by Cameron Ali, RN Outcome: Progressing   Problem: Nutrition: Goal: Adequate nutrition will be maintained 04/03/2020 1018 by Cameron Ali, RN Outcome: Completed/Met 04/03/2020 0833 by Cameron Ali, RN Outcome: Progressing   Problem: Coping: Goal: Level  of anxiety will decrease 04/03/2020 1018 by Cameron Ali, RN Outcome: Completed/Met 04/03/2020 6816 by Cameron Ali, RN Outcome: Progressing   Problem: Elimination: Goal: Will not experience complications related to bowel motility 04/03/2020 1018 by Cameron Ali, RN Outcome: Completed/Met 04/03/2020 6196 by Cameron Ali, RN Outcome: Progressing Goal: Will not experience complications related to urinary retention 04/03/2020 1018 by Cameron Ali, RN Outcome: Completed/Met 04/03/2020 9409 by Cameron Ali, RN Outcome: Progressing   Problem: Pain Managment: Goal: General experience of comfort will improve 04/03/2020 1018 by Cameron Ali, RN Outcome: Completed/Met 04/03/2020 8286 by Cameron Ali, RN Outcome: Progressing   Problem: Safety: Goal: Ability to remain free from injury will improve 04/03/2020 1018 by Cameron Ali, RN Outcome: Completed/Met 04/03/2020 7519 by Cameron Ali, RN Outcome: Progressing   Problem: Skin Integrity: Goal: Risk for impaired skin integrity will decrease 04/03/2020 1018 by Cameron Ali, RN Outcome: Completed/Met 04/03/2020 8242 by Cameron Ali, RN Outcome: Progressing

## 2020-04-03 NOTE — Progress Notes (Signed)
Discharge instructions given to patient. All belongings sent with patient. All questions answered at this time.

## 2020-04-04 DIAGNOSIS — I252 Old myocardial infarction: Secondary | ICD-10-CM | POA: Diagnosis not present

## 2020-04-04 DIAGNOSIS — I25119 Atherosclerotic heart disease of native coronary artery with unspecified angina pectoris: Secondary | ICD-10-CM | POA: Diagnosis not present

## 2020-04-04 DIAGNOSIS — R69 Illness, unspecified: Secondary | ICD-10-CM | POA: Diagnosis not present

## 2020-04-04 DIAGNOSIS — E1122 Type 2 diabetes mellitus with diabetic chronic kidney disease: Secondary | ICD-10-CM | POA: Diagnosis not present

## 2020-04-04 DIAGNOSIS — N1832 Chronic kidney disease, stage 3b: Secondary | ICD-10-CM | POA: Diagnosis not present

## 2020-04-04 DIAGNOSIS — I5082 Biventricular heart failure: Secondary | ICD-10-CM | POA: Diagnosis not present

## 2020-04-04 DIAGNOSIS — I5023 Acute on chronic systolic (congestive) heart failure: Secondary | ICD-10-CM | POA: Diagnosis not present

## 2020-04-04 DIAGNOSIS — I482 Chronic atrial fibrillation, unspecified: Secondary | ICD-10-CM | POA: Diagnosis not present

## 2020-04-04 DIAGNOSIS — I130 Hypertensive heart and chronic kidney disease with heart failure and stage 1 through stage 4 chronic kidney disease, or unspecified chronic kidney disease: Secondary | ICD-10-CM | POA: Diagnosis not present

## 2020-04-06 DIAGNOSIS — I130 Hypertensive heart and chronic kidney disease with heart failure and stage 1 through stage 4 chronic kidney disease, or unspecified chronic kidney disease: Secondary | ICD-10-CM | POA: Diagnosis not present

## 2020-04-06 DIAGNOSIS — I482 Chronic atrial fibrillation, unspecified: Secondary | ICD-10-CM | POA: Diagnosis not present

## 2020-04-06 DIAGNOSIS — R69 Illness, unspecified: Secondary | ICD-10-CM | POA: Diagnosis not present

## 2020-04-06 DIAGNOSIS — N1832 Chronic kidney disease, stage 3b: Secondary | ICD-10-CM | POA: Diagnosis not present

## 2020-04-06 DIAGNOSIS — I5082 Biventricular heart failure: Secondary | ICD-10-CM | POA: Diagnosis not present

## 2020-04-06 DIAGNOSIS — E1122 Type 2 diabetes mellitus with diabetic chronic kidney disease: Secondary | ICD-10-CM | POA: Diagnosis not present

## 2020-04-06 DIAGNOSIS — I252 Old myocardial infarction: Secondary | ICD-10-CM | POA: Diagnosis not present

## 2020-04-06 DIAGNOSIS — I5023 Acute on chronic systolic (congestive) heart failure: Secondary | ICD-10-CM | POA: Diagnosis not present

## 2020-04-06 DIAGNOSIS — I25119 Atherosclerotic heart disease of native coronary artery with unspecified angina pectoris: Secondary | ICD-10-CM | POA: Diagnosis not present

## 2020-04-07 DIAGNOSIS — I10 Essential (primary) hypertension: Secondary | ICD-10-CM | POA: Diagnosis not present

## 2020-04-07 DIAGNOSIS — N183 Chronic kidney disease, stage 3 unspecified: Secondary | ICD-10-CM | POA: Diagnosis not present

## 2020-04-07 DIAGNOSIS — I482 Chronic atrial fibrillation, unspecified: Secondary | ICD-10-CM | POA: Diagnosis not present

## 2020-04-07 DIAGNOSIS — I25738 Atherosclerosis of nonautologous biological coronary artery bypass graft(s) with other forms of angina pectoris: Secondary | ICD-10-CM | POA: Diagnosis not present

## 2020-04-07 DIAGNOSIS — Z299 Encounter for prophylactic measures, unspecified: Secondary | ICD-10-CM | POA: Diagnosis not present

## 2020-04-07 DIAGNOSIS — I5021 Acute systolic (congestive) heart failure: Secondary | ICD-10-CM | POA: Diagnosis not present

## 2020-04-07 DIAGNOSIS — Z6827 Body mass index (BMI) 27.0-27.9, adult: Secondary | ICD-10-CM | POA: Diagnosis not present

## 2020-04-08 DIAGNOSIS — I509 Heart failure, unspecified: Secondary | ICD-10-CM | POA: Diagnosis not present

## 2020-04-08 DIAGNOSIS — R69 Illness, unspecified: Secondary | ICD-10-CM | POA: Diagnosis not present

## 2020-04-08 DIAGNOSIS — I482 Chronic atrial fibrillation, unspecified: Secondary | ICD-10-CM | POA: Diagnosis not present

## 2020-04-08 DIAGNOSIS — I252 Old myocardial infarction: Secondary | ICD-10-CM | POA: Diagnosis not present

## 2020-04-08 DIAGNOSIS — E1122 Type 2 diabetes mellitus with diabetic chronic kidney disease: Secondary | ICD-10-CM | POA: Diagnosis not present

## 2020-04-08 DIAGNOSIS — I130 Hypertensive heart and chronic kidney disease with heart failure and stage 1 through stage 4 chronic kidney disease, or unspecified chronic kidney disease: Secondary | ICD-10-CM | POA: Diagnosis not present

## 2020-04-08 DIAGNOSIS — E119 Type 2 diabetes mellitus without complications: Secondary | ICD-10-CM | POA: Diagnosis not present

## 2020-04-08 DIAGNOSIS — I5082 Biventricular heart failure: Secondary | ICD-10-CM | POA: Diagnosis not present

## 2020-04-08 DIAGNOSIS — I25119 Atherosclerotic heart disease of native coronary artery with unspecified angina pectoris: Secondary | ICD-10-CM | POA: Diagnosis not present

## 2020-04-08 DIAGNOSIS — I5023 Acute on chronic systolic (congestive) heart failure: Secondary | ICD-10-CM | POA: Diagnosis not present

## 2020-04-08 DIAGNOSIS — N1832 Chronic kidney disease, stage 3b: Secondary | ICD-10-CM | POA: Diagnosis not present

## 2020-04-08 DIAGNOSIS — N189 Chronic kidney disease, unspecified: Secondary | ICD-10-CM | POA: Diagnosis not present

## 2020-04-09 ENCOUNTER — Other Ambulatory Visit: Payer: Self-pay

## 2020-04-09 ENCOUNTER — Encounter: Payer: Self-pay | Admitting: Family Medicine

## 2020-04-09 ENCOUNTER — Ambulatory Visit (INDEPENDENT_AMBULATORY_CARE_PROVIDER_SITE_OTHER): Payer: Medicare HMO | Admitting: Family Medicine

## 2020-04-09 VITALS — BP 120/78 | HR 81 | Ht 72.0 in | Wt 176.0 lb

## 2020-04-09 DIAGNOSIS — I4891 Unspecified atrial fibrillation: Secondary | ICD-10-CM

## 2020-04-09 DIAGNOSIS — I5022 Chronic systolic (congestive) heart failure: Secondary | ICD-10-CM | POA: Diagnosis not present

## 2020-04-09 DIAGNOSIS — I2510 Atherosclerotic heart disease of native coronary artery without angina pectoris: Secondary | ICD-10-CM | POA: Diagnosis not present

## 2020-04-09 DIAGNOSIS — Z951 Presence of aortocoronary bypass graft: Secondary | ICD-10-CM

## 2020-04-09 NOTE — Patient Instructions (Addendum)
Medication Instructions:  Continue all current medications.  Labwork: none  Testing/Procedures: none  Follow-Up: 3 months   Any Other Special Instructions Will Be Listed Below (If Applicable).  If you need a refill on your cardiac medications before your next appointment, please call your pharmacy.  

## 2020-04-09 NOTE — Progress Notes (Signed)
Cardiology Office Note  Date: 04/09/2020   ID: Joseph Greer Apr 22, 1956, MRN [id number]  PCP:  Joseph Blitz, MD  Cardiologist:  Candee Furbish, MD Electrophysiologist:  None   Chief Complaint: Hospital follow up NSTEMI  History of Present Illness: Joseph Greer is a 64 y.o. male with a history of liver transplant 2009 followed at [facility], stage III CKD, hep C, HTN, type II DM. CAD, Acute systolic HF  He had been transferred to [facility] after originally presenting to the emergency department at [facility] on 12/09/2019 where he was complaining of shortness of breath and fatigue.  He was noted to be in atrial fibrillation with RVR.  He was transferred to [facility] for further evaluation and cardiac catheterization.  Catheterization showed severe three-vessel disease.  Echocardiogram showed EF to be severely depressed with severe hypokinesis of all segments except for septum.  EF was estimated 20 to 25%.  Mild to moderate MR.  CVTS consultation was requested.  On 12/19/2019 he was taken to the operating room where he underwent a bilateral maze procedure with RF ablation and cryoablation.  Underwent closure of PFO and CABG x5. CABG 7/16 with LIMA-LAD, RIMA-ramus, seq SVG-PD/PL, SVG-D.  Postoperative course was complicated by the need for multiple vasoactive infusions, treatment with amiodarone for atrial fibrillation and eventually converted to normal sinus rhythm.  Underwent CRRT and subsequent transition to hemodialysis.   Treated postoperative for HAP with vancomycin and cefepime due to findings on chest x-ray and increased leukocytosis.  Dialysis catheter was placed and arrangements were made for outpatient dialysis.  He presented to advanced heart failure clinic on 01/06/2020 and saw Joseph Greer.  He was doing well continuing with hemodialysis on Mondays, Wednesdays, and Fridays.  At last visit he was here for hospital follow-up.  He denied any anginal or exertional  symptoms, palpitations or arrhythmias, orthostatic symptoms, stroke or TIA-like symptoms, dyspeptic symptoms, or blood in stool or urine.  Denied any claudication-like symptoms, DVT or PE-like symptoms or lower extremity edema no PND or orthopnea.  Median sternotomy scar and chest tube insertion sites are healing well.  Right graft harvesting site healing well.  He is tolerating dialysis well.  He has a permacath in his right upper chest.  Stated he started driving his car without issue.  He is interested in cardiac rehab.  He was not sure when he can be released to participate in cardiac rehab.  He was following at advanced heart failure clinic.  Recent hospital admission on 04/01/2020  with presentation to the [facility] emergency room complaints of chest tightness and dyspnea with fatigue, lightheadedness, orthopnea, poor appetite, nausea vomiting.  Had elevated BNP of 1413 in the emergency room.  Initial troponin 64.  Repeat was 66.  Received IV Lasix x2 doses 40 mg each and diuresed very well.  Lost approximately 3 pounds and felt much better.  Repeat echo during hospital stay showed EF of 20 to 25%.  No change since previous echo in July 2021.  He was discharged on 04/03/2020  He is here for 92-month follow-up and recent hospital follow-up.  States he is feeling much better since the fluid was removed.  States he is watching his fluid intake and weighing himself every day as well as restricting sodium intake.  Recent creatinine was 2.02 and GFR 36 on 04/02/2020.  States he is not undergoing dialysis anymore.  He continues taking Lasix 40 mg daily.  Today's weight was 173 on his  home scales.  Denies any lower extremity edema or dyspnea on exertion.  States he was doing some exercises at the [facility] as well as lifting small amount of weights without problems.  States last week he started feeling tired and decided to stay home for a week until he felt more rested.  He plans to resume as soon as he feels better.     Past Medical History:  Diagnosis Date  . Anxiety   . CAD (coronary artery disease)    a. s/p CABG on 12/19/2019 with LIMA-LAD, seq SVG-RPDA-PL, RIMA-RI and SVG-D2 with Maze and LAA clipping  . CHF (congestive heart failure) (HCC)    a. EF 20-25% by echo in 12/2019  . CKD (chronic kidney disease), stage III ([place])   . Depression   . Diabetes mellitus type 2, noninsulin dependent ([place])   . Hepatitis C   . Hypertension   . Psoriasis   . Status post liver transplant [facility])     Past Surgical History:  Procedure Laterality Date  . CLIPPING OF ATRIAL APPENDAGE N/A 12/19/2019   Procedure: CLIPPING OF ATRIAL APPENDAGE USING ATRICURE CLIP SIZE 50MM;  Surgeon: Wonda Olds, MD;  Location: [place];  Service: Open Heart Surgery;  Laterality: N/A;  . COLONOSCOPY    . COLONOSCOPY WITH PROPOFOL N/A 06/01/2017   Procedure: COLONOSCOPY WITH PROPOFOL;  Surgeon: Rogene Houston, MD;  [facility];  Service: Endoscopy;  Laterality: N/A;  7:30  . CORONARY ARTERY BYPASS GRAFT N/A 12/19/2019   Procedure: CORONARY ARTERY BYPASS GRAFTING (CABG) TIMES  X 5,   USING BILATERAL MAMMARY ARTERY AND RIGHT LEG GREATER SAPHENOUS VEIN HARVESTED ENDOSCOPICALLY;  Surgeon: Wonda Olds, MD;  Location: [place];  Service: Open Heart Surgery;  Laterality: N/A;  . HERNIA REPAIR Right   . IR FLUORO GUIDE CV LINE RIGHT  12/30/2019  . IR REMOVAL TUN CV CATH W/O FL  02/12/2020  . IR US GUIDE VASC ACCESS RIGHT  12/30/2019  . LEFT HEART CATH AND CORONARY ANGIOGRAPHY N/A 12/12/2019   Procedure: LEFT HEART CATH AND CORONARY ANGIOGRAPHY;  Surgeon: Jettie Booze, MD;  Location: [place] CV LAB;  Service: Cardiovascular;  Laterality: N/A;  . LIVER BIOPSY    . LIVER SURGERY    . MAZE N/A 12/19/2019   Procedure: MAZE;  Surgeon: Wonda Olds, MD;  Location: [place];  Service: Open Heart Surgery;  Laterality: N/A;  . POLYPECTOMY  06/01/2017   Procedure: POLYPECTOMY;  Surgeon: Rogene Houston, MD;  [facility];  Service: Endoscopy;;  polyp at sigmoid colon x2, ascending colon polyp, hepatic flexure polyp  . RIGHT HEART CATH N/A 12/17/2019   Procedure: RIGHT HEART CATH;  Surgeon: Larey Dresser, MD;  Location: [place] CV LAB;  Service: Cardiovascular;  Laterality: N/A;  . TEE WITHOUT CARDIOVERSION N/A 12/19/2019   Procedure: TRANSESOPHAGEAL ECHOCARDIOGRAM (TEE);  Surgeon: Wonda Olds, MD;  Location: [place];  Service: Open Heart Surgery;  Laterality: N/A;  . UPPER GASTROINTESTINAL ENDOSCOPY      Current Outpatient Medications  Medication Sig Dispense Refill  . apixaban (ELIQUIS) 5 MG TABS tablet Take 1 tablet (5 mg total) by mouth 2 (two) times daily. 60 tablet 2  . Apremilast (OTEZLA) 30 MG TABS Take 1 tablet by mouth 2 (two) times daily.    Marland Kitchen aspirin EC 81 MG EC tablet Take 1 tablet (81 mg total) by mouth daily. Swallow whole.    Marland Kitchen atorvastatin (LIPITOR) 80 MG  tablet Take 1 tablet (80 mg total) by mouth daily. 30 tablet 11  . carvedilol (COREG) 3.125 MG tablet Take 1 tablet (3.125 mg total) by mouth 2 (two) times daily. 60 tablet 11  . furosemide (LASIX) 40 MG tablet Take 1 tablet (40 mg total) by mouth daily. 30 tablet 0  . LORazepam (ATIVAN) 1 MG tablet Take 1 mg by mouth 2 (two) times daily as needed (panic attacks).     . pantoprazole (PROTONIX) 40 MG tablet Take 1 tablet (40 mg total) by mouth daily. 30 tablet 1  . tacrolimus (PROGRAF) 1 MG capsule Take 1-2 capsules (1-2 mg total) by mouth See admin instructions. 1 tablet am, 2 tablets pm    . temazepam (RESTORIL) 30 MG capsule Take 30 mg by mouth at bedtime.    . triamcinolone cream (KENALOG) 0.1 % Apply 1 application topically daily.     Marland Kitchen venlafaxine XR (EFFEXOR-XR) 75 MG 24 hr capsule Take 75 mg by mouth daily.     No current facility-administered medications for this visit.   Allergies:  Morphine and related   Social History: The patient  reports that he quit smoking about 12 years ago. His smoking use included  cigarettes. He smoked 1.00 pack per day. He has never used smokeless tobacco. He reports that he does not drink alcohol and does not use drugs.   Family History: The patient's family history includes Diabetes in his mother; Heart disease in his father and son.   ROS:  Please see the history of present illness. Otherwise, complete review of systems is positive for none.  All other systems are reviewed and negative.   Physical Exam: VS:  BP 120/78   Pulse 81   Ht 6' (1.829 m)   Wt 176 lb (79.8 kg)   SpO2 98%   BMI 23.87 kg/m , BMI Body mass index is 23.87 kg/m.  Wt Readings from Last 3 Encounters:  04/09/20 176 lb (79.8 kg)  04/03/20 174 lb 6.4 oz (79.1 kg)  03/01/20 177 lb (80.3 kg)    General: Patient appears comfortable at rest. Lungs: Clear to auscultation, nonlabored breathing at rest. Cardiac: Regular rate and rhythm, no S3 or significant systolic murmur, no pericardial rub. Extremities: No pitting edema, distal pulses 2+. Skin: Warm and dry.  Median sternotomy, chest tube insertion site, and right leg vein harvesting site are clean and dry without infection noted. Musculoskeletal: No kyphosis. Neuropsychiatric: Alert and oriented x3, affect grossly appropriate.  ECG:  January 06, 2020 EKG normal sinus rhythm rate of 68, nonspecific intraventricular conduction block, abnormal QRS/T angle consider primary T wave abnormality.  Recent Labwork: 12/11/2019: TSH 4.855 12/29/2019: Magnesium 2.4 04/01/2020: ALT 12; AST 16; B Natriuretic Peptide 1,413.0 04/02/2020: BUN 18; Creatinine, Ser 2.02; Hemoglobin 10.1; Platelets 212; Potassium 3.9; Sodium 140     Component Value Date/Time   CHOL 127 02/17/2020 1223   TRIG 83 02/17/2020 1223   HDL 44 02/17/2020 1223   CHOLHDL 2.9 02/17/2020 1223   VLDL 17 02/17/2020 1223   LDLCALC 66 02/17/2020 1223    Other Studies Reviewed Today:   Echocardiogram 04/02/2020  1. Global hypokinesis with more severe inferior/inferoseptal hypokinesis. .  Left ventricular ejection fraction, by estimation, is 20 to 25%. The left ventricle has severely decreased function. The left ventricle demonstrates regional wall motion abnormalities (see scoring diagram/findings for description). The left ventricular internal cavity size was moderately dilated. There is mild left ventricular hypertrophy. Left ventricular diastolic parameters are indeterminate. 2. Right ventricular  systolic function mild to moderately reduced. The right ventricular size is normal. There is normal pulmonary artery systolic pressure. 3. Left atrial size was severely dilated. 4. The mitral valve is normal in structure. Moderate mitral valve regurgitation. No evidence of mitral stenosis. 5. The aortic valve is tricuspid. Aortic valve regurgitation is mild. No aortic stenosis is present. 6. Aortic dilatation noted. There is mild dilatation of the aortic root, measuring 40 mm.    Echocardiogram 12/23/2019 Limited  Limited study for tamponade. Very difficult study due to poor windows. No pericardial effusion present. LVEF severely reduced with global hypokinesis. There is severe hypokinesis of the entire lateral and inferior walls. EF reduced to 20-25% compared with 12/21/2019 which was likely 30-35%. 2. Left ventricular ejection fraction, by estimation, is 20 to 25%. The left ventricle has severely decreased function. 3. The mitral valve is grossly normal. Trivial mitral valve regurgitation. No evidence of mitral stenosis. 4. The aortic valve is grossly normal.  Echocardiogram 12/21/2019  1. Left ventricular ejection fraction, by estimation, is 35 to 40%. The left ventricle has moderately decreased function. Left ventricular diastolic parameters are consistent with Grade II diastolic dysfunction Elevated left atrial pressure. 2. Right ventricular systolic function is normal. The right ventricular size is normal. 3. The mitral valve is abnormal. Mild mitral valve  regurgitation. 4. The aortic valve is abnormal. Aortic valve regurgitation is not visualized. Mild aortic valve sclerosis is present, with no evidence of aortic valve stenosis. 5. The inferior vena cava is dilated in size with >50% respiratory variability, suggesting right atrial pressure of 8 mmHg  Echocardiogram 12/19/2019  Left Ventricle: has severely reduced systolic function, with an ejection fraction of 25%. The wall motion is abnormal with regional variation. Improved anterior wall motion following CPB, but LV still severely reduced function. . Aorta: The aorta appears unchanged from pre-bypass. . Aortic Valve: No evidence of AV insufficiency following separation from CPB. . Mitral Valve: Mitral valve regurgitation is trivial following separation from CPB. . Tricuspid Valve: The tricuspid valve appears unchanged from pre-bypass. . Interatrial Septum: s/p PFO closure with no evidence of intra-atrial shunting by Color flow doppler. . Pericardium: The pericardium appears unchanged from pre-bypass  RHC 12/17/2019 Conclusion  1. Elevated PCWP >> RA pressure.  2. Pulmonary venous hypertension.  3. Preserved cardiac output.   Vascular US doppler 12/16/2019 Right Carotid: Velocities in the right ICA are consistent with a 1-39% stenosis. Left Carotid: Velocities in the left ICA are consistent with a 1-39% stenosis. Vertebrals: Bilateral vertebral arteries demonstrate antegrade flow. Right ABI: Resting right ankle-brachial index is within normal range. No evidence of significant right lower extremity arterial disease. Left ABI: Resting left ankle-brachial index is within normal range. No evidence of significant left lower extremity arterial disease. Right Upper Extremity: Doppler waveforms remain within normal limits with right radial compression. Doppler waveforms remain within normal limits with right ulnar compression. Left Upper Extremity: Doppler waveform obliterate with left  radial compression. Doppler waveforms remain within normal limits with left ulnar compression. Electronically signed by Harold Barban MD on 12/16/2019 at 4:20:38 PM. Right Rt Pressure (mmHg) Index Waveform Comment Brachial triphasic restricted extremity PTA 134 1.13 triphasic DP 137 1.15 triphasic Left Lt Pressure (mmHg) Index Waveform Comment Brachial 119 triphasic PTA 131 1.10 triphasic DP 140 1.18 triphasic ABI/TBI Today's ABI/TBI Previous ABI/TBI Right 1.15 Left 1.18 Site Pressure Index Doppler Comments Brachial triphasic restricted extremity Radial triphasic Ulnar triphasic Site Pressure Index Doppler Comments Brachial 119 triphasic Radial triphasic Ulnar triphasic  LHC 12/12/2019 LEFT HEART CATH AND CORONARY ANGIOGRAPHY  Conclusion    Dist RCA lesion is 100% stenosed. Faint left to right and right to right collaterals.  Ramus lesion is 95% stenosed.  1st Diag lesion is 75% stenosed.  Prox Cx lesion is 99% stenosed. THis is the culprit lesion. TIMI 2 flow.  Ost LAD to Prox LAD lesion is 60% stenosed. Complex lesion with shelf of plaque.  Mid LAD lesion is 80% stenosed.  LV end diastolic pressure is moderately elevated.  There is no aortic valve stenosis.   Plan for cardiac surgery consult.  Diagnostic Dominance: Right     Assessment and Plan:   1. Status post coronary artery bypass grafts x 5 / NSTEMI CABG 7/16 with LIMA-LAD, RIMA-ramus, seq SVG-PD/PL, SVG-D.   Median sternotomy site, chest tube insertion site and right leg vein harvesting site clean and dry at last visit.  Currently denies any anginal symptoms.  Median sternotomy incision looking much better since last visit.  2. Chronic systolic heart failure ([place]) Echo 7/21 with EF 20-25%, moderately decreased RV systolic function. Cardiac MRI with LV EF 26%, RV EF 38%, near full thickness scar in inferolateral wall suggesting lack of viability in the LCx . No ARB, ARNI, Digoxin, or  spironolactone 2/2 renal failure.  Recent echo on 04/02/2020:  EF continues at 20 to 25%.  Continue carvedilol 3.125 mg p.o. twice daily.  Continue Lasix 40 mg daily.  Reinforced with the patient the need to do daily weights and report weight gain of 3 pounds in 24 hours or 5 pounds and 1 week.  Continue to take Lasix as directed.  Restrict fluids to 1.5 to 2 L/day.  Restrict sodium intake.  He verbalizes understanding.  He has an upcoming appointment with Dr. [name] December 16 at advanced heart failure clinic.  3. CAD in native artery Severe 3VD on cath 7/21 =>60-70% ostial LAD, 80% mLAD, 95% ramus, 99% proximal LCx with TIMI-2 flow down the rest of the LCx, occluded distal RCA. Suspected out of hospital inferolateral MI.  Status post 5 vessel bypass.  Denies any progressive anginal or exertional symptoms.  Continue aspirin 81 mg daily.  Continue atorvastatin 80 mg by mouth daily.  Continue carvedilol 3.125 mg p.o. twice daily.   4.Atrial fibrillation S/P MAZE/ LAA clipping HR and rhythm are regular at 81.   Continue Eliquis 5 mg p.o. twice daily. EKG 04/02/2020: Normal sinus rhythm rate of 89, short PR, LVH with secondary repolarization abnormality, borderline QT prolonged.  Amiodarone was stopped at discharge from last hospital admission on 04/03/2020  5. AKI on HD.  Nephrology following. Initially on CRRT then HD. On HD MWF.  Patient states today he is finished with dialysis.  Recent creatinine and GFR (2.02 and 36 on 04/02/2020)  Medication Adjustments/Labs and Tests Ordered: Current medicines are reviewed at length with the patient today.  Concerns regarding medicines are outlined above.   Disposition: Follow-up with Dr. Domenic Polite or APP 3 months  Signed, Levell July, NP 04/09/2020 3:58 PM    [facility] [place], [place], [place] [address] Phone: [phone]; Fax: [phone]

## 2020-04-13 DIAGNOSIS — N1832 Chronic kidney disease, stage 3b: Secondary | ICD-10-CM | POA: Diagnosis not present

## 2020-04-13 DIAGNOSIS — E1122 Type 2 diabetes mellitus with diabetic chronic kidney disease: Secondary | ICD-10-CM | POA: Diagnosis not present

## 2020-04-13 DIAGNOSIS — I252 Old myocardial infarction: Secondary | ICD-10-CM | POA: Diagnosis not present

## 2020-04-13 DIAGNOSIS — I482 Chronic atrial fibrillation, unspecified: Secondary | ICD-10-CM | POA: Diagnosis not present

## 2020-04-13 DIAGNOSIS — R69 Illness, unspecified: Secondary | ICD-10-CM | POA: Diagnosis not present

## 2020-04-13 DIAGNOSIS — I25119 Atherosclerotic heart disease of native coronary artery with unspecified angina pectoris: Secondary | ICD-10-CM | POA: Diagnosis not present

## 2020-04-13 DIAGNOSIS — I5082 Biventricular heart failure: Secondary | ICD-10-CM | POA: Diagnosis not present

## 2020-04-13 DIAGNOSIS — I5023 Acute on chronic systolic (congestive) heart failure: Secondary | ICD-10-CM | POA: Diagnosis not present

## 2020-04-13 DIAGNOSIS — I130 Hypertensive heart and chronic kidney disease with heart failure and stage 1 through stage 4 chronic kidney disease, or unspecified chronic kidney disease: Secondary | ICD-10-CM | POA: Diagnosis not present

## 2020-04-14 DIAGNOSIS — K7460 Unspecified cirrhosis of liver: Secondary | ICD-10-CM | POA: Diagnosis not present

## 2020-04-14 DIAGNOSIS — R69 Illness, unspecified: Secondary | ICD-10-CM | POA: Diagnosis not present

## 2020-04-14 DIAGNOSIS — E261 Secondary hyperaldosteronism: Secondary | ICD-10-CM | POA: Diagnosis not present

## 2020-04-14 DIAGNOSIS — Z944 Liver transplant status: Secondary | ICD-10-CM | POA: Diagnosis not present

## 2020-04-14 DIAGNOSIS — I25119 Atherosclerotic heart disease of native coronary artery with unspecified angina pectoris: Secondary | ICD-10-CM | POA: Diagnosis not present

## 2020-04-14 DIAGNOSIS — L4050 Arthropathic psoriasis, unspecified: Secondary | ICD-10-CM | POA: Diagnosis not present

## 2020-04-14 DIAGNOSIS — I110 Hypertensive heart disease with heart failure: Secondary | ICD-10-CM | POA: Diagnosis not present

## 2020-04-14 DIAGNOSIS — E119 Type 2 diabetes mellitus without complications: Secondary | ICD-10-CM | POA: Diagnosis not present

## 2020-04-14 DIAGNOSIS — I509 Heart failure, unspecified: Secondary | ICD-10-CM | POA: Diagnosis not present

## 2020-04-15 DIAGNOSIS — D631 Anemia in chronic kidney disease: Secondary | ICD-10-CM | POA: Diagnosis not present

## 2020-04-15 DIAGNOSIS — I129 Hypertensive chronic kidney disease with stage 1 through stage 4 chronic kidney disease, or unspecified chronic kidney disease: Secondary | ICD-10-CM | POA: Diagnosis not present

## 2020-04-15 DIAGNOSIS — N1832 Chronic kidney disease, stage 3b: Secondary | ICD-10-CM | POA: Diagnosis not present

## 2020-04-15 DIAGNOSIS — R809 Proteinuria, unspecified: Secondary | ICD-10-CM | POA: Diagnosis not present

## 2020-04-15 DIAGNOSIS — I5022 Chronic systolic (congestive) heart failure: Secondary | ICD-10-CM | POA: Diagnosis not present

## 2020-04-15 DIAGNOSIS — N189 Chronic kidney disease, unspecified: Secondary | ICD-10-CM | POA: Diagnosis not present

## 2020-04-16 DIAGNOSIS — I5082 Biventricular heart failure: Secondary | ICD-10-CM | POA: Diagnosis not present

## 2020-04-16 DIAGNOSIS — I252 Old myocardial infarction: Secondary | ICD-10-CM | POA: Diagnosis not present

## 2020-04-16 DIAGNOSIS — E1122 Type 2 diabetes mellitus with diabetic chronic kidney disease: Secondary | ICD-10-CM | POA: Diagnosis not present

## 2020-04-16 DIAGNOSIS — I5023 Acute on chronic systolic (congestive) heart failure: Secondary | ICD-10-CM | POA: Diagnosis not present

## 2020-04-16 DIAGNOSIS — I482 Chronic atrial fibrillation, unspecified: Secondary | ICD-10-CM | POA: Diagnosis not present

## 2020-04-16 DIAGNOSIS — N1832 Chronic kidney disease, stage 3b: Secondary | ICD-10-CM | POA: Diagnosis not present

## 2020-04-16 DIAGNOSIS — I25119 Atherosclerotic heart disease of native coronary artery with unspecified angina pectoris: Secondary | ICD-10-CM | POA: Diagnosis not present

## 2020-04-16 DIAGNOSIS — R69 Illness, unspecified: Secondary | ICD-10-CM | POA: Diagnosis not present

## 2020-04-16 DIAGNOSIS — I130 Hypertensive heart and chronic kidney disease with heart failure and stage 1 through stage 4 chronic kidney disease, or unspecified chronic kidney disease: Secondary | ICD-10-CM | POA: Diagnosis not present

## 2020-04-21 DIAGNOSIS — I130 Hypertensive heart and chronic kidney disease with heart failure and stage 1 through stage 4 chronic kidney disease, or unspecified chronic kidney disease: Secondary | ICD-10-CM | POA: Diagnosis not present

## 2020-04-22 ENCOUNTER — Other Ambulatory Visit: Payer: Medicare HMO

## 2020-04-24 DIAGNOSIS — I130 Hypertensive heart and chronic kidney disease with heart failure and stage 1 through stage 4 chronic kidney disease, or unspecified chronic kidney disease: Secondary | ICD-10-CM | POA: Diagnosis not present

## 2020-04-24 DIAGNOSIS — I5023 Acute on chronic systolic (congestive) heart failure: Secondary | ICD-10-CM | POA: Diagnosis not present

## 2020-04-24 DIAGNOSIS — I252 Old myocardial infarction: Secondary | ICD-10-CM | POA: Diagnosis not present

## 2020-04-24 DIAGNOSIS — I5082 Biventricular heart failure: Secondary | ICD-10-CM | POA: Diagnosis not present

## 2020-04-24 DIAGNOSIS — N1832 Chronic kidney disease, stage 3b: Secondary | ICD-10-CM | POA: Diagnosis not present

## 2020-04-24 DIAGNOSIS — E1122 Type 2 diabetes mellitus with diabetic chronic kidney disease: Secondary | ICD-10-CM | POA: Diagnosis not present

## 2020-04-24 DIAGNOSIS — I25119 Atherosclerotic heart disease of native coronary artery with unspecified angina pectoris: Secondary | ICD-10-CM | POA: Diagnosis not present

## 2020-04-24 DIAGNOSIS — I482 Chronic atrial fibrillation, unspecified: Secondary | ICD-10-CM | POA: Diagnosis not present

## 2020-04-24 DIAGNOSIS — R69 Illness, unspecified: Secondary | ICD-10-CM | POA: Diagnosis not present

## 2020-04-27 ENCOUNTER — Ambulatory Visit: Payer: Medicare HMO | Admitting: Family Medicine

## 2020-04-28 ENCOUNTER — Other Ambulatory Visit: Payer: Self-pay | Admitting: Cardiothoracic Surgery

## 2020-04-28 ENCOUNTER — Other Ambulatory Visit: Payer: Self-pay | Admitting: Surgical

## 2020-05-03 ENCOUNTER — Other Ambulatory Visit: Payer: Self-pay | Admitting: *Deleted

## 2020-05-03 ENCOUNTER — Other Ambulatory Visit: Payer: Self-pay | Admitting: Cardiothoracic Surgery

## 2020-05-03 MED ORDER — FUROSEMIDE 40 MG PO TABS: 40.0000 mg | ORAL_TABLET | Freq: Every day | ORAL | 1 refills | Status: DC

## 2020-05-04 DIAGNOSIS — R69 Illness, unspecified: Secondary | ICD-10-CM | POA: Diagnosis not present

## 2020-05-04 DIAGNOSIS — I10 Essential (primary) hypertension: Secondary | ICD-10-CM | POA: Diagnosis not present

## 2020-05-04 DIAGNOSIS — K219 Gastro-esophageal reflux disease without esophagitis: Secondary | ICD-10-CM | POA: Diagnosis not present

## 2020-05-07 ENCOUNTER — Other Ambulatory Visit: Payer: Self-pay | Admitting: Surgical

## 2020-05-11 ENCOUNTER — Other Ambulatory Visit: Payer: Self-pay | Admitting: *Deleted

## 2020-05-11 ENCOUNTER — Other Ambulatory Visit: Payer: Self-pay | Admitting: Surgical

## 2020-05-11 MED ORDER — APIXABAN 5 MG PO TABS
5.0000 mg | ORAL_TABLET | Freq: Two times a day (BID) | ORAL | 2 refills | Status: DC
Start: 2020-05-11 — End: 2020-08-23

## 2020-05-12 DIAGNOSIS — N1832 Chronic kidney disease, stage 3b: Secondary | ICD-10-CM | POA: Diagnosis not present

## 2020-05-13 DIAGNOSIS — N189 Chronic kidney disease, unspecified: Secondary | ICD-10-CM | POA: Diagnosis not present

## 2020-05-13 DIAGNOSIS — I129 Hypertensive chronic kidney disease with stage 1 through stage 4 chronic kidney disease, or unspecified chronic kidney disease: Secondary | ICD-10-CM | POA: Diagnosis not present

## 2020-05-13 DIAGNOSIS — R809 Proteinuria, unspecified: Secondary | ICD-10-CM | POA: Diagnosis not present

## 2020-05-13 DIAGNOSIS — D631 Anemia in chronic kidney disease: Secondary | ICD-10-CM | POA: Diagnosis not present

## 2020-05-13 DIAGNOSIS — I5022 Chronic systolic (congestive) heart failure: Secondary | ICD-10-CM | POA: Diagnosis not present

## 2020-05-13 DIAGNOSIS — N1832 Chronic kidney disease, stage 3b: Secondary | ICD-10-CM | POA: Diagnosis not present

## 2020-05-20 ENCOUNTER — Encounter (HOSPITAL_COMMUNITY): Payer: Self-pay | Admitting: Cardiology

## 2020-05-20 ENCOUNTER — Ambulatory Visit (HOSPITAL_COMMUNITY)
Admission: RE | Admit: 2020-05-20 | Discharge: 2020-05-20 | Disposition: A | Discharge status: Home / Self Care | Payer: Medicare HMO | Source: Ambulatory Visit | Attending: Cardiology | Admitting: Cardiology

## 2020-05-20 ENCOUNTER — Other Ambulatory Visit: Payer: Self-pay

## 2020-05-20 ENCOUNTER — Encounter (HOSPITAL_COMMUNITY): Payer: Self-pay

## 2020-05-20 ENCOUNTER — Other Ambulatory Visit (HOSPITAL_COMMUNITY): Payer: Self-pay | Admitting: *Deleted

## 2020-05-20 VITALS — BP 130/70 | HR 77 | Wt 176.0 lb

## 2020-05-20 DIAGNOSIS — E785 Hyperlipidemia, unspecified: Secondary | ICD-10-CM | POA: Diagnosis not present

## 2020-05-20 DIAGNOSIS — I252 Old myocardial infarction: Secondary | ICD-10-CM | POA: Insufficient documentation

## 2020-05-20 DIAGNOSIS — E1122 Type 2 diabetes mellitus with diabetic chronic kidney disease: Secondary | ICD-10-CM | POA: Insufficient documentation

## 2020-05-20 DIAGNOSIS — I5021 Acute systolic (congestive) heart failure: Secondary | ICD-10-CM | POA: Diagnosis not present

## 2020-05-20 DIAGNOSIS — Z79899 Other long term (current) drug therapy: Secondary | ICD-10-CM | POA: Diagnosis not present

## 2020-05-20 DIAGNOSIS — I130 Hypertensive heart and chronic kidney disease with heart failure and stage 1 through stage 4 chronic kidney disease, or unspecified chronic kidney disease: Secondary | ICD-10-CM | POA: Insufficient documentation

## 2020-05-20 DIAGNOSIS — I2510 Atherosclerotic heart disease of native coronary artery without angina pectoris: Secondary | ICD-10-CM

## 2020-05-20 DIAGNOSIS — Z944 Liver transplant status: Secondary | ICD-10-CM | POA: Insufficient documentation

## 2020-05-20 DIAGNOSIS — Z87891 Personal history of nicotine dependence: Secondary | ICD-10-CM | POA: Diagnosis not present

## 2020-05-20 DIAGNOSIS — I480 Paroxysmal atrial fibrillation: Secondary | ICD-10-CM | POA: Diagnosis not present

## 2020-05-20 DIAGNOSIS — Z8249 Family history of ischemic heart disease and other diseases of the circulatory system: Secondary | ICD-10-CM | POA: Diagnosis not present

## 2020-05-20 DIAGNOSIS — N183 Chronic kidney disease, stage 3 unspecified: Secondary | ICD-10-CM | POA: Insufficient documentation

## 2020-05-20 DIAGNOSIS — Z7982 Long term (current) use of aspirin: Secondary | ICD-10-CM | POA: Insufficient documentation

## 2020-05-20 DIAGNOSIS — Z951 Presence of aortocoronary bypass graft: Secondary | ICD-10-CM | POA: Insufficient documentation

## 2020-05-20 DIAGNOSIS — Z7984 Long term (current) use of oral hypoglycemic drugs: Secondary | ICD-10-CM | POA: Insufficient documentation

## 2020-05-20 DIAGNOSIS — I5022 Chronic systolic (congestive) heart failure: Secondary | ICD-10-CM

## 2020-05-20 DIAGNOSIS — I255 Ischemic cardiomyopathy: Secondary | ICD-10-CM | POA: Diagnosis not present

## 2020-05-20 DIAGNOSIS — Z7901 Long term (current) use of anticoagulants: Secondary | ICD-10-CM | POA: Insufficient documentation

## 2020-05-20 MED ORDER — ENTRESTO 24-26 MG PO TABS: 1.0000 | ORAL_TABLET | Freq: Two times a day (BID) | ORAL | 3 refills | Status: DC

## 2020-05-20 MED ORDER — DAPAGLIFLOZIN PROPANEDIOL 10 MG PO TABS: 10.0000 mg | ORAL_TABLET | Freq: Every day | ORAL | 3 refills | Status: DC

## 2020-05-20 MED ORDER — FUROSEMIDE 40 MG PO TABS: 20.0000 mg | ORAL_TABLET | Freq: Every day | ORAL | 1 refills | Status: DC

## 2020-05-20 NOTE — Progress Notes (Signed)
Medication Samples have been provided to the patient.  Drug name: Eliquis       Strength: 5 mg        Qty: 4  LOT: [id number]  Exp.Date: 09/2021  Dosing instructions: Take one tablet Twice daily   The patient has been instructed regarding the correct time, dose, and frequency of taking this medication, including desired effects and most common side effects.   Juanita Laster Mamye Bolds 1:37 PM 05/20/2020

## 2020-05-20 NOTE — Patient Instructions (Signed)
STOP Lisinopril   START Entresto 40/51mg  twice daily after 36 hours stopping lisinopril.  START Farxiga 10 mg daily.  Lab work in 10 days .  Follow up in  3 weeks with PharmD.  Follow up in 6 weeks with Dr.McLean.

## 2020-05-21 ENCOUNTER — Telehealth (HOSPITAL_COMMUNITY): Payer: Self-pay | Admitting: Pharmacy Technician

## 2020-05-21 NOTE — Telephone Encounter (Signed)
Sent applications for [employer] and [employer] via fax.  Will follow up.

## 2020-05-21 NOTE — Progress Notes (Signed)
Advanced Heart Failure Clinic Note   Referring Physician: PCP: Monico Blitz, MD HF Cardiology: Dr. [name]   HPI: 64 y.o. male w/ h/o HCV s/p liver transplant in 2009(followed at [facility]), Stage III CKD, HTN and T2DM.   He was admitted 7/21 w/ NSTEMI and Afib w/ RVR, found to have severe 3VD and biventricular heart failure, LVEF 20-25%, RV moderately reduced. Underwent CABG + MAZE + LA appendage clipping 12/19/19. Required milrinone post operatively. Post op course complicated by ATN/ renal failure. Required CVVHD but remained anuric. Tunneled HD cath placed and transitioned to iHD, MWF.  He was able to transition off HD as an outpatient.   He had repeat echo done in 10/21 with EF 20-25%, moderate LV dilation, mild-moderately decreased RV systolic function, moderate MR.   He returns today for followup of CHF and CAD.  He is in NSR today.  He is very active, exercises at the [facility].  No significant exertional dyspnea.  No exertional chest pain.  No palpitations, lightheadedness, or syncope.  No orthopnea/PND.  He is off amiodarone.   ECG (personally reviewed): NSR, LVH with repolarization abnormality.   Labs (9/21): LDL 66, HDL 44 Labs (10/21): K 3.9, creatinine 2.02 Labs (12/21): K 3.7, creatinine 1.58  Review of systems complete and found to be negative unless listed in HPI.    PMH: 1. CKD stage III: He was on HD after CABG in 7/21 but renal function recovered.  2. HCV with cirrhosis, s/p liver transplant in 2009.  3. HTN 4. Psoriasis 5. Type 2 diabetes 6. Hyperlipidemia 7. CAD: NSTEMI 7/21.  - CABG (7/21) with LIMA-LAD, RIMA-ramus, sequential SVG-PDA and PLV, SVG -D 8. Atrial fibrillation: Paroxysmal.  - S/p Maze and LA appendage clip with CABG in 7/21.  9. Chronic systolic CHF: Ischemic cardiomyopathy.  - Echo (10/21): EF 20-25%, moderate LV dilation, mild-moderately decreased RV systolic function, moderate MR.   Current Outpatient Medications  Medication Sig Dispense Refill    apixaban (ELIQUIS) 5 MG TABS tablet Take 1 tablet (5 mg total) by mouth 2 (two) times daily. 60 tablet 2   Apremilast (OTEZLA) 30 MG TABS Take 1 tablet by mouth 2 (two) times daily.     aspirin EC 81 MG EC tablet Take 1 tablet (81 mg total) by mouth daily. Swallow whole.     atorvastatin (LIPITOR) 80 MG tablet Take 1 tablet (80 mg total) by mouth daily. 30 tablet 11   carvedilol (COREG) 3.125 MG tablet Take 1 tablet (3.125 mg total) by mouth 2 (two) times daily. 60 tablet 11   halobetasol (ULTRAVATE) 0.05 % cream Apply 1 application topically 2 (two) times daily.     LORazepam (ATIVAN) 1 MG tablet Take 1 mg by mouth 2 (two) times daily as needed (panic attacks).      multivitamin (RENA-VIT) TABS tablet Take 1 tablet by mouth daily.     tacrolimus (PROGRAF) 1 MG capsule Take 1-2 capsules (1-2 mg total) by mouth See admin instructions. 1 tablet am, 2 tablets pm     temazepam (RESTORIL) 30 MG capsule Take 30 mg by mouth at bedtime.     venlafaxine XR (EFFEXOR-XR) 75 MG 24 hr capsule Take 75 mg by mouth daily.     dapagliflozin propanediol (FARXIGA) 10 MG TABS tablet Take 1 tablet (10 mg total) by mouth daily before breakfast. 30 tablet 3   furosemide (LASIX) 40 MG tablet Take 0.5 tablets (20 mg total) by mouth daily. 45 tablet 1   sacubitril-valsartan (ENTRESTO) 24-26 MG  Take 1 tablet by mouth 2 (two) times daily. 60 tablet 3   No current facility-administered medications for this encounter.    Allergies  Allergen Reactions   Morphine And Related Nausea And Vomiting      Social History   Socioeconomic History   Marital status: Single    Spouse name: Not on file   Number of children: Not on file   Years of education: Not on file   Highest education level: Not on file  Occupational History   Not on file  Tobacco Use   Smoking status: Former Smoker    Packs/day: 1.00    Types: Cigarettes    Quit date: 09/04/2007    Years since quitting: 12.7   Smokeless tobacco:  Never Used  Vaping Use   Vaping Use: Never used  Substance and Sexual Activity   Alcohol use: No   Drug use: No   Sexual activity: Yes    Birth control/protection: None  Other Topics Concern   Not on file  Social History Narrative   Not on file   Social Determinants of Health   Financial Resource Strain: Not on file  Food Insecurity: No Food Insecurity   Worried About Running Out of Food in the Last Year: Never true   Ran Out of Food in the Last Year: Never true  Transportation Needs: No Transportation Needs   Lack of Transportation (Medical): No   Lack of Transportation (Non-Medical): No  Physical Activity: Not on file  Stress: Not on file  Social Connections: Not on file  Intimate Partner Violence: Not on file      Family History  Problem Relation Age of Onset   Diabetes Mother    Heart disease Father    Heart disease Son     Vitals:   05/20/20 1055  BP: 130/70  Pulse: 77  SpO2: 98%  Weight: 79.8 kg (176 lb)   PHYSICAL EXAM: General: NAD Neck: No JVD, no thyromegaly or thyroid nodule.  Lungs: Clear to auscultation bilaterally with normal respiratory effort. CV: Nondisplaced PMI.  Heart regular S1/S2, no S3/S4, no murmur.  No peripheral edema.  No carotid bruit.  Normal pedal pulses.  Abdomen: Soft, nontender, no hepatosplenomegaly, no distention.  Skin: Intact without lesions or rashes.  Neurologic: Alert and oriented x 3.  Psych: Normal affect. Extremities: No clubbing or cyanosis.  HEENT: Normal.   ASSESSMENT & PLAN:  1. CAD: NSTEMI in 7/21. Suspect out of hospital inferolateral MI. Cardiac MRI showed that the inferolateral wall was likely not viable, but the remainder of the LV myocardium was likely viable. He had CABG 12/19/19 with LIMA-LAD, RIMA-ramus, seq SVG-PD/PL, SVG-D.  No chest pain.  - Continue ASA 81 mg daily  - Continue atorvastatin 80 mg qhs. Good lipids in 9/21.  2. Chronic Systolic CHF: Ischemic cardiomyopathy.  Echo 7/21  with EF 20-25%, moderately decreased RV systolic function. Cardiac MRI with LV EF 26%, RV EF 38%, near full thickness scar in inferolateral wall suggesting lack of viability in the LCx territory.  Now post-CABG.  Repeat echo in 10/21 showed EF 20-25%, moderate LV dilation, mild-moderately decreased RV systolic function, moderate MR.  Doing well currently, NYHA class II symptoms.  He is not volume overloaded on exam.  Creatinine down to 1.5 when last checked earlier this month.  - Continue Coreg 3.125 mg bid.    - Stop lisinopril.  After 36 hrs, start Entresto 24/26 bid.  - Decrease Lasix to 20 mg daily.  -  Add Farxiga 10 mg daily.  - BMET 10 days.  - Refer to EP for ICD.  Narrow QRS, not CRT candidate.  3. Atrial fibrillation:  S/p Maze + LAA clipping on 7/16.  He is off amiodarone.  He is in NSR today.  - Continue Eliquis 5 mg bid. Denies abnormal bleeding. Check CBC today  4. CKD: Stage 3.  He required HD after CABG but renal function recovered. Follow BMET closely.  5. Liver transplant for HCV: Followed at [facility]. On Tacrolimus.   Followup with HF pharmacist in 3 wks.  See me in 6 wks.   Loralie Champagne, MD 05/21/20

## 2020-05-26 DIAGNOSIS — R279 Unspecified lack of coordination: Secondary | ICD-10-CM | POA: Diagnosis not present

## 2020-05-26 DIAGNOSIS — R69 Illness, unspecified: Secondary | ICD-10-CM | POA: Diagnosis not present

## 2020-05-26 DIAGNOSIS — M6281 Muscle weakness (generalized): Secondary | ICD-10-CM | POA: Diagnosis not present

## 2020-06-02 ENCOUNTER — Encounter: Payer: Self-pay | Admitting: *Deleted

## 2020-06-08 NOTE — Progress Notes (Signed)
PCP: Monico Blitz, MD HF Cardiology: Dr. [name]   HPI:  65 y.o. male w/ h/o HCV s/p liver transplant in 2009(followed at [facility]), Stage III CKD, HTN and T2DM.   He was admitted 7/21 w/ NSTEMI and Afib w/ RVR, found to have severe 3VD and biventricular heart failure, LVEF 20-25%, RV moderately reduced. Underwent CABG + MAZE + LA appendage clipping 12/19/19. Required milrinone post operatively. Post op course complicated by ATN/ renal failure. Required CVVHD but remained anuric. Tunneled HD cath placed and transitioned to iHD, MWF.  He was able to transition off HD as an outpatient.   He had repeat echo done in 10/21 with EF 20-25%, moderate LV dilation, mild-moderately decreased RV systolic function, moderate MR.   He recently returned to [facility] for follow up of CHF and CAD on 05/20/20.  He was in NSR.  He reported being very active, exercises at the [facility].  No significant exertional dyspnea.  No exertional chest pain.  No palpitations, lightheadedness, or syncope.  No orthopnea/PND.  He is off amiodarone.   Today he returns to HF clinic for pharmacist medication titration. At last visit with MD lisinopril was discontinued and Entresto 24/26 mg BID was started after a 36 hour washout period. Additionally, he was started on Farxiga 10 mg daily and furosemide was decreased to 20 mg daily. Of note, he never decreased furosemide to 20 mg daily and continued to take 40 mg daily (did not realize instructions had changed). Overall he is feeling really well today. No dizziness, lightheadedness, chest pain or palpitations. No SOB/DOE. Has been doing 30 minutes of cardio 4-5 times per week to stay active. Weight has been stable at home at 172-173 lbs. No LEE, PND or orthopnea. Recently approved for [employer] patient assistance for [employer]. [employer] patient assistance for [employer] still pending.     HF Medications: Carvedilol 3.125 mg BID Entresto 24/26 mg BID Farxiga 10 mg daily Furosemide 20 mg daily -  reported taking 40 mg daily.   Has the patient been experiencing any side effects to the medications prescribed?  no  Does the patient have any problems obtaining medications due to transportation or finances?   Yes - has [employer] but [place] and [place] copays still expensive. Recently approved for [employer] patient assistance for [employer]. Farxiga patient assistance still pending.   Understanding of regimen: good Understanding of indications: good Potential of compliance: good Patient understands to avoid NSAIDs. Patient understands to avoid decongestants.    Pertinent Lab Values: . 04/02/20: Serum creatinine 2.02, BUN 18,/ Potassium 3.9, Sodium 140 . BMET today pending  Vital Signs: . Weight: 178.8 lbs (last clinic weight: 176 lbs) . Blood pressure: 118/66  . Heart rate: 90   Assessment: 1. CAD: NSTEMI in 7/21. Suspect out of hospital inferolateral MI. Cardiac MRI showed that the inferolateral wall was likely not viable, but the remainder of the LV myocardium was likely viable. He had CABG 12/19/19 with LIMA-LAD, RIMA-ramus, seq SVG-PD/PL, SVG-D.  No chest pain.  - Continue ASA 81 mg daily  - Continue atorvastatin 80 mg qhs. Good lipids in 9/21.  2. Chronic Systolic CHF: Ischemic cardiomyopathy.  Echo 7/21 with EF 20-25%, moderately decreased RV systolic function. Cardiac MRI with LV EF 26%, RV EF 38%, near full thickness scar in inferolateral wall suggesting lack of viability in the LCx territory. Now post-CABG.  Repeat echo in 10/21 showed EF 20-25%, moderate LV dilation, mild-moderately decreased RV systolic function, moderate MR.   - NYHA class  II symptoms.  He is not volume overloaded on exam.   - BMET today pending.  - Decrease furosemide to 20 mg daily (did not decrease as instructed after last visit).  -Continue carvedilol 3.125 mg BID.  - Continue Entresto 24/26 mg BID. Patient will call [employer] for shipment.  - Start spironolactone 12.5 mg daily.  Monitor K closely as patient is also on tacrolimus. Will repeat BMET in 1 week locally.  - Continue Farxiga 10 mg daily.  - Referred to EP for ICD.  Narrow QRS, not CRT candidate.  3. Atrial fibrillation:  S/p Maze + LAA clipping on 7/16.  He is off amiodarone.  He is in NSR today.  -Continue Eliquis 5 mg BID. Denies abnormal bleeding.   4. CKD: Stage 3.  He required HD after CABG but renal function recovered. Follow BMET closely.  - BMET today pending 5. Liver transplant for HCV: Followed at [facility]. On Tacrolimus.    Plan: 1) Medication changes: Based on clinical presentation, vital signs and recent labs will decrease furosemide to 20 mg daily and start spironolactone 12.5 mg daily.  2) Labs: BMET pending 3) Follow-up: BMET in 1 week. Clinic visit in 1 month with Dr. [name].    Audry Riles, PharmD, BCPS, BCCP, CPP Heart Failure Clinic Pharmacist [phone]

## 2020-06-09 ENCOUNTER — Inpatient Hospital Stay (HOSPITAL_COMMUNITY): Admission: RE | Admit: 2020-06-09 | Payer: Medicare HMO | Source: Ambulatory Visit

## 2020-06-14 ENCOUNTER — Institutional Professional Consult (permissible substitution): Payer: Medicare HMO | Admitting: Internal Medicine

## 2020-06-17 DIAGNOSIS — I5022 Chronic systolic (congestive) heart failure: Secondary | ICD-10-CM | POA: Diagnosis not present

## 2020-06-24 ENCOUNTER — Other Ambulatory Visit: Payer: Self-pay

## 2020-06-24 ENCOUNTER — Ambulatory Visit (HOSPITAL_COMMUNITY)
Admission: RE | Admit: 2020-06-24 | Discharge: 2020-06-24 | Disposition: A | Discharge status: Home / Self Care | Payer: Medicare HMO | Source: Ambulatory Visit | Attending: Cardiology | Admitting: Cardiology

## 2020-06-24 VITALS — BP 118/66 | HR 90 | Wt 178.8 lb

## 2020-06-24 DIAGNOSIS — E1122 Type 2 diabetes mellitus with diabetic chronic kidney disease: Secondary | ICD-10-CM | POA: Insufficient documentation

## 2020-06-24 DIAGNOSIS — I252 Old myocardial infarction: Secondary | ICD-10-CM | POA: Insufficient documentation

## 2020-06-24 DIAGNOSIS — Z951 Presence of aortocoronary bypass graft: Secondary | ICD-10-CM | POA: Insufficient documentation

## 2020-06-24 DIAGNOSIS — Z7901 Long term (current) use of anticoagulants: Secondary | ICD-10-CM | POA: Insufficient documentation

## 2020-06-24 DIAGNOSIS — I2510 Atherosclerotic heart disease of native coronary artery without angina pectoris: Secondary | ICD-10-CM | POA: Diagnosis not present

## 2020-06-24 DIAGNOSIS — I4891 Unspecified atrial fibrillation: Secondary | ICD-10-CM | POA: Diagnosis not present

## 2020-06-24 DIAGNOSIS — N183 Chronic kidney disease, stage 3 unspecified: Secondary | ICD-10-CM | POA: Diagnosis not present

## 2020-06-24 DIAGNOSIS — Z944 Liver transplant status: Secondary | ICD-10-CM | POA: Insufficient documentation

## 2020-06-24 DIAGNOSIS — I255 Ischemic cardiomyopathy: Secondary | ICD-10-CM | POA: Insufficient documentation

## 2020-06-24 DIAGNOSIS — Z7982 Long term (current) use of aspirin: Secondary | ICD-10-CM | POA: Diagnosis not present

## 2020-06-24 DIAGNOSIS — I130 Hypertensive heart and chronic kidney disease with heart failure and stage 1 through stage 4 chronic kidney disease, or unspecified chronic kidney disease: Secondary | ICD-10-CM | POA: Insufficient documentation

## 2020-06-24 DIAGNOSIS — I5022 Chronic systolic (congestive) heart failure: Secondary | ICD-10-CM | POA: Insufficient documentation

## 2020-06-24 LAB — BASIC METABOLIC PANEL
Anion gap: 11 (ref 5–15)
BUN: 18 mg/dL (ref 8–23)
CO2: 29 mmol/L (ref 22–32)
Calcium: 8.7 mg/dL — ABNORMAL LOW (ref 8.9–10.3)
Chloride: 100 mmol/L (ref 98–111)
Creatinine, Ser: 1.62 mg/dL — ABNORMAL HIGH (ref 0.61–1.24)
GFR, Estimated: 47 mL/min — ABNORMAL LOW (ref >60)
Glucose, Bld: 108 mg/dL — ABNORMAL HIGH (ref 70–99)
Potassium: 4.1 mmol/L (ref 3.5–5.1)
Sodium: 140 mmol/L (ref 135–145)

## 2020-06-24 MED ORDER — FUROSEMIDE 40 MG PO TABS: 20.0000 mg | ORAL_TABLET | Freq: Every day | ORAL | 1 refills | Status: DC

## 2020-06-24 MED ORDER — SPIRONOLACTONE 25 MG PO TABS: 12.5000 mg | ORAL_TABLET | Freq: Every day | ORAL | 3 refills | Status: DC

## 2020-06-24 NOTE — Patient Instructions (Addendum)
It was a pleasure seeing you today!  MEDICATIONS: -We are changing your medications today -Start spironolactone 12.5 mg (1/2 tablet) daily - Decrease furosemide to 20 mg (1/2 tablet) daily -Call if you have questions about your medications.  LABS: -We will call you if your labs need attention.  NEXT APPOINTMENT: Return to clinic in 1 month with Dr. [name].  In general, to take care of your heart failure: -Limit your fluid intake to 2 Liters (half-gallon) per day.   -Limit your salt intake to ideally 2-3 grams (2000-3000 mg) per day. -Weigh yourself daily and record, and bring that "weight diary" to your next appointment.  (Weight gain of 2-3 pounds in 1 day typically means fluid weight.) -The medications for your heart are to help your heart and help you live longer.   -Please contact us before stopping any of your heart medications.  Call the clinic at [phone] with questions or to reschedule future appointments.

## 2020-06-25 ENCOUNTER — Other Ambulatory Visit (HOSPITAL_COMMUNITY): Payer: Self-pay

## 2020-06-25 MED ORDER — SPIRONOLACTONE 25 MG PO TABS: 12.5000 mg | ORAL_TABLET | Freq: Every day | ORAL | 3 refills | Status: DC

## 2020-06-28 DIAGNOSIS — Z944 Liver transplant status: Secondary | ICD-10-CM | POA: Diagnosis not present

## 2020-06-30 ENCOUNTER — Telehealth (HOSPITAL_COMMUNITY): Payer: Self-pay | Admitting: Pharmacy Technician

## 2020-06-30 NOTE — Telephone Encounter (Signed)
Advanced Heart Failure Patient Advocate Encounter   Patient was approved to receive Farxiga from [employer]  Effective dates: 06/30/20 through 06/04/21  Charlann Boxer, CPhT

## 2020-07-05 DIAGNOSIS — Z85828 Personal history of other malignant neoplasm of skin: Secondary | ICD-10-CM | POA: Diagnosis not present

## 2020-07-05 DIAGNOSIS — D849 Immunodeficiency, unspecified: Secondary | ICD-10-CM | POA: Diagnosis not present

## 2020-07-05 DIAGNOSIS — Z298 Encounter for other specified prophylactic measures: Secondary | ICD-10-CM | POA: Diagnosis not present

## 2020-07-05 DIAGNOSIS — N1831 Chronic kidney disease, stage 3a: Secondary | ICD-10-CM | POA: Diagnosis not present

## 2020-07-05 DIAGNOSIS — Z944 Liver transplant status: Secondary | ICD-10-CM | POA: Diagnosis not present

## 2020-07-05 DIAGNOSIS — Z5181 Encounter for therapeutic drug level monitoring: Secondary | ICD-10-CM | POA: Diagnosis not present

## 2020-07-06 ENCOUNTER — Encounter (HOSPITAL_COMMUNITY): Payer: Self-pay

## 2020-07-06 ENCOUNTER — Other Ambulatory Visit (HOSPITAL_COMMUNITY): Payer: Self-pay | Admitting: *Deleted

## 2020-07-06 MED ORDER — FUROSEMIDE 40 MG PO TABS: 40.0000 mg | ORAL_TABLET | Freq: Every day | ORAL | 1 refills | Status: DC

## 2020-07-12 NOTE — Progress Notes (Addendum)
Cardiology Office Note  Date: 07/14/2020   ID: Illya, Gienger 07-Mar-1956, MRN [id number]  PCP:  Monico Blitz, MD  Cardiologist:  Carlyle Dolly, MD Electrophysiologist:  None   Chief Complaint: Cardiac follow-up systolic heart failure,  History of Present Illness: Joseph Greer is a 65 y.o. male with a history of liver transplant 2009 followed at [facility], stage III CKD, hep C, HTN, type II DM. CAD, Acute systolic HF  Saw Dr. [name] on 05/20/2020.  He was very active and exercising at the [facility] with no exertional dyspnea or chest pain, lightheadedness, syncope, orthopnea, PND.  He was off amiodarone.  He was continuing aspirin, atorvastatin with good lipids on September 2021.  He was continuing Coreg 3.125 mg.  His lisinopril was stopped and after washout, he was started on Entresto 24/26 p.o. twice daily.  His Lasix was decreased to 20 mg daily.  Farxiga 10 mg daily was added.  He was referred to EP for ICD.  Narrow QRS, not CRT candidate. He was in normal sinus rhythm during that visit.  He was continuing Eliquis 5 mg p.o. twice daily.  No abnormal bleeding.  CBC and basic metabolic panel were ordered.  He was following at [facility] for his liver transplant secondary to HCV on tacrolimus.  He was to follow-up with heart failure pharmacist in 3 weeks and see Dr. [name] again in 6 weeks.  He is here today for 33-month follow-up.  States he is feeling very well and having no issues.  States he was having some issues with weight gain and his Lasix was increased back to 40 mg daily which has resolved the increased weight.  He denies any dyspnea on exertion, weight gain (weight today 175 pounds), PND, orthopnea, or lower extremity edema.  Denies any issues with change to [facility] at heart failure clinic.  He states he is having follow-up labs done tomorrow in preparation for his next visit with the heart failure clinic on February 22.  He denies any anginal or exertional symptoms,  palpitations or arrhythmias, orthostatic symptoms, CVA or TIA-like symptoms, bleeding.   Past Medical History:  Diagnosis Date  . Anxiety   . CAD (coronary artery disease)    a. s/p CABG on 12/19/2019 with LIMA-LAD, seq SVG-RPDA-PL, RIMA-RI and SVG-D2 with Maze and LAA clipping  . CHF (congestive heart failure) (HCC)    a. EF 20-25% by echo in 12/2019  . CKD (chronic kidney disease), stage III ([place])   . Depression   . Diabetes mellitus type 2, noninsulin dependent ([place])   . Hepatitis C   . Hypertension   . Psoriasis   . Status post liver transplant [facility])     Past Surgical History:  Procedure Laterality Date  . CLIPPING OF ATRIAL APPENDAGE N/A 12/19/2019   Procedure: CLIPPING OF ATRIAL APPENDAGE USING ATRICURE CLIP SIZE 50MM;  Surgeon: Wonda Olds, MD;  Location: [place];  Service: Open Heart Surgery;  Laterality: N/A;  . COLONOSCOPY    . COLONOSCOPY WITH PROPOFOL N/A 06/01/2017   Procedure: COLONOSCOPY WITH PROPOFOL;  Surgeon: Rogene Houston, MD;  [facility];  Service: Endoscopy;  Laterality: N/A;  7:30  . CORONARY ARTERY BYPASS GRAFT N/A 12/19/2019   Procedure: CORONARY ARTERY BYPASS GRAFTING (CABG) TIMES  X 5,   USING BILATERAL MAMMARY ARTERY AND RIGHT LEG GREATER SAPHENOUS VEIN HARVESTED ENDOSCOPICALLY;  Surgeon: Wonda Olds, MD;  Location: [place];  Service: Open Heart Surgery;  Laterality: N/A;  .  HERNIA REPAIR Right   . IR FLUORO GUIDE CV LINE RIGHT  12/30/2019  . IR REMOVAL TUN CV CATH W/O FL  02/12/2020  . IR US GUIDE VASC ACCESS RIGHT  12/30/2019  . LEFT HEART CATH AND CORONARY ANGIOGRAPHY N/A 12/12/2019   Procedure: LEFT HEART CATH AND CORONARY ANGIOGRAPHY;  Surgeon: Jettie Booze, MD;  Location: [place] CV LAB;  Service: Cardiovascular;  Laterality: N/A;  . LIVER BIOPSY    . LIVER SURGERY    . MAZE N/A 12/19/2019   Procedure: MAZE;  Surgeon: Wonda Olds, MD;  Location: [place];  Service: Open Heart Surgery;  Laterality: N/A;  . POLYPECTOMY   06/01/2017   Procedure: POLYPECTOMY;  Surgeon: Rogene Houston, MD;  [facility];  Service: Endoscopy;;  polyp at sigmoid colon x2, ascending colon polyp, hepatic flexure polyp  . RIGHT HEART CATH N/A 12/17/2019   Procedure: RIGHT HEART CATH;  Surgeon: Larey Dresser, MD;  Location: [place] CV LAB;  Service: Cardiovascular;  Laterality: N/A;  . TEE WITHOUT CARDIOVERSION N/A 12/19/2019   Procedure: TRANSESOPHAGEAL ECHOCARDIOGRAM (TEE);  Surgeon: Wonda Olds, MD;  Location: [place];  Service: Open Heart Surgery;  Laterality: N/A;  . UPPER GASTROINTESTINAL ENDOSCOPY      Current Outpatient Medications  Medication Sig Dispense Refill  . apixaban (ELIQUIS) 5 MG TABS tablet Take 1 tablet (5 mg total) by mouth 2 (two) times daily. 60 tablet 2  . Apremilast (OTEZLA) 30 MG TABS Take 1 tablet by mouth 2 (two) times daily.    Marland Kitchen aspirin EC 81 MG EC tablet Take 1 tablet (81 mg total) by mouth daily. Swallow whole.    Marland Kitchen atorvastatin (LIPITOR) 80 MG tablet Take 1 tablet (80 mg total) by mouth daily. 30 tablet 11  . carvedilol (COREG) 3.125 MG tablet Take 1 tablet (3.125 mg total) by mouth 2 (two) times daily. 60 tablet 11  . dapagliflozin propanediol (FARXIGA) 10 MG TABS tablet Take 1 tablet (10 mg total) by mouth daily before breakfast. 30 tablet 3  . furosemide (LASIX) 40 MG tablet Take 1 tablet (40 mg total) by mouth daily. 45 tablet 1  . halobetasol (ULTRAVATE) 0.05 % cream Apply 1 application topically 2 (two) times daily.    Marland Kitchen LORazepam (ATIVAN) 1 MG tablet Take 1 mg by mouth 2 (two) times daily as needed (panic attacks).     . sacubitril-valsartan (ENTRESTO) 24-26 MG Take 1 tablet by mouth 2 (two) times daily. 60 tablet 3  . spironolactone (ALDACTONE) 25 MG tablet Take 0.5 tablets (12.5 mg total) by mouth daily. 45 tablet 3  . tacrolimus (PROGRAF) 1 MG capsule Take 1 mg by mouth 2 (two) times daily.    . temazepam (RESTORIL) 30 MG capsule Take 30 mg by mouth at bedtime.    Marland Kitchen  venlafaxine XR (EFFEXOR-XR) 75 MG 24 hr capsule Take 75 mg by mouth daily.     No current facility-administered medications for this visit.   Allergies:  Morphine and related   Social History: The patient  reports that he quit smoking about 12 years ago. His smoking use included cigarettes. He smoked 1.00 pack per day. He has never used smokeless tobacco. He reports that he does not drink alcohol and does not use drugs.   Family History: The patient's family history includes Diabetes in his mother; Heart disease in his father and son.   ROS:  Please see the history of present illness. Otherwise, complete review of systems  is positive for none.  All other systems are reviewed and negative.   Physical Exam: VS:  BP 114/62   Pulse 90   Ht 5\' 11"  (1.803 m)   Wt 175 lb 12.8 oz (79.7 kg)   SpO2 96%   BMI 24.52 kg/m , BMI Body mass index is 24.52 kg/m.  Wt Readings from Last 3 Encounters:  07/13/20 175 lb 12.8 oz (79.7 kg)  06/24/20 178 lb 12.8 oz (81.1 kg)  05/20/20 176 lb (79.8 kg)    General: Patient appears comfortable at rest. Lungs: Clear to auscultation, nonlabored breathing at rest. Cardiac: Regular rate and rhythm, no S3 or significant systolic murmur, no pericardial rub. Extremities: No pitting edema, distal pulses 2+. Skin: Warm and dry.  Median sternotomy, chest tube insertion site, and right leg vein harvesting site are clean and dry without infection noted. Musculoskeletal: No kyphosis. Neuropsychiatric: Alert and oriented x3, affect grossly appropriate.  ECG:  January 06, 2020 EKG normal sinus rhythm rate of 68, nonspecific intraventricular conduction block, abnormal QRS/T angle consider primary T wave abnormality.  Recent Labwork: 12/11/2019: TSH 4.855 12/29/2019: Magnesium 2.4 04/01/2020: ALT 12; AST 16; B Natriuretic Peptide 1,413.0 04/02/2020: Hemoglobin 10.1; Platelets 212 06/24/2020: BUN 18; Creatinine, Ser 1.62; Potassium 4.1; Sodium 140     Component Value  Date/Time   CHOL 127 02/17/2020 1223   TRIG 83 02/17/2020 1223   HDL 44 02/17/2020 1223   CHOLHDL 2.9 02/17/2020 1223   VLDL 17 02/17/2020 1223   LDLCALC 66 02/17/2020 1223    Other Studies Reviewed Today:   Echocardiogram 04/02/2020  1. Global hypokinesis with more severe inferior/inferoseptal hypokinesis. . Left ventricular ejection fraction, by estimation, is 20 to 25%. The left ventricle has severely decreased function. The left ventricle demonstrates regional wall motion abnormalities (see scoring diagram/findings for description). The left ventricular internal cavity size was moderately dilated. There is mild left ventricular hypertrophy. Left ventricular diastolic parameters are indeterminate. 2. Right ventricular systolic function mild to moderately reduced. The right ventricular size is normal. There is normal pulmonary artery systolic pressure. 3. Left atrial size was severely dilated. 4. The mitral valve is normal in structure. Moderate mitral valve regurgitation. No evidence of mitral stenosis. 5. The aortic valve is tricuspid. Aortic valve regurgitation is mild. No aortic stenosis is present. 6. Aortic dilatation noted. There is mild dilatation of the aortic root, measuring 40 mm.    Echocardiogram 12/23/2019 Limited  Limited study for tamponade. Very difficult study due to poor windows. No pericardial effusion present. LVEF severely reduced with global hypokinesis. There is severe hypokinesis of the entire lateral and inferior walls. EF reduced to 20-25% compared with 12/21/2019 which was likely 30-35%. 2. Left ventricular ejection fraction, by estimation, is 20 to 25%. The left ventricle has severely decreased function. 3. The mitral valve is grossly normal. Trivial mitral valve regurgitation. No evidence of mitral stenosis. 4. The aortic valve is grossly normal.  Echocardiogram 12/21/2019  1. Left ventricular ejection fraction, by estimation, is 35 to 40%. The  left ventricle has moderately decreased function. Left ventricular diastolic parameters are consistent with Grade II diastolic dysfunction Elevated left atrial pressure. 2. Right ventricular systolic function is normal. The right ventricular size is normal. 3. The mitral valve is abnormal. Mild mitral valve regurgitation. 4. The aortic valve is abnormal. Aortic valve regurgitation is not visualized. Mild aortic valve sclerosis is present, with no evidence of aortic valve stenosis. 5. The inferior vena cava is dilated in size with >50% respiratory variability,  suggesting right atrial pressure of 8 mmHg  Echocardiogram 12/19/2019  Left Ventricle: has severely reduced systolic function, with an ejection fraction of 25%. The wall motion is abnormal with regional variation. Improved anterior wall motion following CPB, but LV still severely reduced function. . Aorta: The aorta appears unchanged from pre-bypass. . Aortic Valve: No evidence of AV insufficiency following separation from CPB. . Mitral Valve: Mitral valve regurgitation is trivial following separation from CPB. . Tricuspid Valve: The tricuspid valve appears unchanged from pre-bypass. . Interatrial Septum: s/p PFO closure with no evidence of intra-atrial shunting by Color flow doppler. . Pericardium: The pericardium appears unchanged from pre-bypass  RHC 12/17/2019 Conclusion  1. Elevated PCWP >> RA pressure.  2. Pulmonary venous hypertension.  3. Preserved cardiac output.   Vascular US doppler 12/16/2019 Right Carotid: Velocities in the right ICA are consistent with a 1-39% stenosis. Left Carotid: Velocities in the left ICA are consistent with a 1-39% stenosis. Vertebrals: Bilateral vertebral arteries demonstrate antegrade flow. Right ABI: Resting right ankle-brachial index is within normal range. No evidence of significant right lower extremity arterial disease. Left ABI: Resting left ankle-brachial index is within normal  range. No evidence of significant left lower extremity arterial disease. Right Upper Extremity: Doppler waveforms remain within normal limits with right radial compression. Doppler waveforms remain within normal limits with right ulnar compression. Left Upper Extremity: Doppler waveform obliterate with left radial compression. Doppler waveforms remain within normal limits with left ulnar compression. Electronically signed by Harold Barban MD on 12/16/2019 at 4:20:38 PM. Right Rt Pressure (mmHg) Index Waveform Comment Brachial triphasic restricted extremity PTA 134 1.13 triphasic DP 137 1.15 triphasic Left Lt Pressure (mmHg) Index Waveform Comment Brachial 119 triphasic PTA 131 1.10 triphasic DP 140 1.18 triphasic ABI/TBI Today's ABI/TBI Previous ABI/TBI Right 1.15 Left 1.18 Site Pressure Index Doppler Comments Brachial triphasic restricted extremity Radial triphasic Ulnar triphasic Site Pressure Index Doppler Comments Brachial 119 triphasic Radial triphasic Ulnar triphasic   LHC 12/12/2019 LEFT HEART CATH AND CORONARY ANGIOGRAPHY  Conclusion    Dist RCA lesion is 100% stenosed. Faint left to right and right to right collaterals.  Ramus lesion is 95% stenosed.  1st Diag lesion is 75% stenosed.  Prox Cx lesion is 99% stenosed. THis is the culprit lesion. TIMI 2 flow.  Ost LAD to Prox LAD lesion is 60% stenosed. Complex lesion with shelf of plaque.  Mid LAD lesion is 80% stenosed.  LV end diastolic pressure is moderately elevated.  There is no aortic valve stenosis.   Plan for cardiac surgery consult.  Diagnostic Dominance: Right     Assessment and Plan:   1. Status post coronary artery bypass grafts x 5 / NSTEMI CABG 7/16 with LIMA-LAD, RIMA-ramus, seq SVG-PD/PL, SVG-D.   Median sternotomy site, chest tube insertion site and right leg vein harvesting site clean and dry at last visit.  Currently denies any anginal symptoms.     2. Chronic systolic heart  failure ([place])  Recent echo on 04/02/2020:  EF continues at 20 to 25%.  Continue carvedilol 3.125 mg p.o. twice daily.  Continue Lasix 40 mg p.o. daily.  Continue Entresto 24/26 mg p.o. twice daily.  Continue spironolactone 12.5 mg p.o. daily restrict fluids to 1.5 to 2 L/day.  Restrict sodium intake.  Continue to weigh daily.  Report weight gain of 3 pounds in 24 hours or 5 pounds in 1 week.  Keep follow-up with Dr. [name] on February 22 at 10:40 AM.   3. CAD in native artery  Severe 3VD on cath 7/21 =>60-70% ostial LAD, 80% mLAD, 95% ramus, 99% proximal LCx with TIMI-2 flow down the rest of the LCx, occluded distal RCA. Suspected out of hospital inferolateral MI.  Status post 5 vessel bypass.  Denies any progressive anginal or exertional symptoms.  Continue aspirin 81 mg daily.  Continue atorvastatin 80 mg by mouth daily.  Continue carvedilol 3.125 mg p.o. twice daily.   4.Atrial fibrillation S/P MAZE/ LAA clipping.  HR and rhythm are regular at 90. Continue Eliquis 5 mg p.o. twice daily.  Continue carvedilol 3.125 mg p.o. twice daily  5.  CKD stage III  Recent creatinine and GFR recent creatinine 1.62 and GFR 47 on 06/24/2020.  This has improved since October 2021 when creatinine was 2.02 and GFR was 36.  Medication Adjustments/Labs and Tests Ordered: Current medicines are reviewed at length with the patient today.  Concerns regarding medicines are outlined above.   Disposition: Follow-up with Dr. Domenic Polite or APP 3 months  Signed, Levell July, NP 07/14/2020 8:13 PM    [facility] [place], [place], [place] [address] Phone: [phone]; Fax: [phone]

## 2020-07-13 ENCOUNTER — Ambulatory Visit (INDEPENDENT_AMBULATORY_CARE_PROVIDER_SITE_OTHER): Payer: Medicare HMO | Admitting: Family Medicine

## 2020-07-13 ENCOUNTER — Encounter: Payer: Self-pay | Admitting: Family Medicine

## 2020-07-13 VITALS — BP 114/62 | HR 90 | Ht 71.0 in | Wt 175.8 lb

## 2020-07-13 DIAGNOSIS — I2510 Atherosclerotic heart disease of native coronary artery without angina pectoris: Secondary | ICD-10-CM

## 2020-07-13 DIAGNOSIS — I4891 Unspecified atrial fibrillation: Secondary | ICD-10-CM | POA: Diagnosis not present

## 2020-07-13 DIAGNOSIS — I5022 Chronic systolic (congestive) heart failure: Secondary | ICD-10-CM | POA: Diagnosis not present

## 2020-07-13 DIAGNOSIS — N1831 Chronic kidney disease, stage 3a: Secondary | ICD-10-CM

## 2020-07-13 DIAGNOSIS — Z951 Presence of aortocoronary bypass graft: Secondary | ICD-10-CM

## 2020-07-13 DIAGNOSIS — I509 Heart failure, unspecified: Secondary | ICD-10-CM | POA: Diagnosis not present

## 2020-07-13 NOTE — Patient Instructions (Signed)
Medication Instructions:  Continue all current medications.  Labwork: none  Testing/Procedures: none  Follow-Up: 3 months   Any Other Special Instructions Will Be Listed Below (If Applicable).  If you need a refill on your cardiac medications before your next appointment, please call your pharmacy.  

## 2020-07-14 ENCOUNTER — Telehealth (HOSPITAL_COMMUNITY): Payer: Self-pay | Admitting: Pharmacy Technician

## 2020-07-14 NOTE — Telephone Encounter (Signed)
Advanced Heart Failure Patient Advocate Encounter   Patient was approved to receive Entresto from [employer]  Patient ID: [id number] Effective dates: 06/16/20 through 06/04/21  Attempted to call patient regarding approval, no way to leave message.  Charlann Boxer, CPhT

## 2020-07-14 NOTE — Progress Notes (Signed)
Ok. Thanks!

## 2020-07-20 DIAGNOSIS — N1832 Chronic kidney disease, stage 3b: Secondary | ICD-10-CM | POA: Diagnosis not present

## 2020-07-20 DIAGNOSIS — Z944 Liver transplant status: Secondary | ICD-10-CM | POA: Diagnosis not present

## 2020-07-26 DIAGNOSIS — E211 Secondary hyperparathyroidism, not elsewhere classified: Secondary | ICD-10-CM | POA: Diagnosis not present

## 2020-07-26 DIAGNOSIS — I129 Hypertensive chronic kidney disease with stage 1 through stage 4 chronic kidney disease, or unspecified chronic kidney disease: Secondary | ICD-10-CM | POA: Diagnosis not present

## 2020-07-26 DIAGNOSIS — N1832 Chronic kidney disease, stage 3b: Secondary | ICD-10-CM | POA: Diagnosis not present

## 2020-07-26 DIAGNOSIS — L570 Actinic keratosis: Secondary | ICD-10-CM | POA: Diagnosis not present

## 2020-07-26 DIAGNOSIS — I5022 Chronic systolic (congestive) heart failure: Secondary | ICD-10-CM | POA: Diagnosis not present

## 2020-07-26 DIAGNOSIS — R809 Proteinuria, unspecified: Secondary | ICD-10-CM | POA: Diagnosis not present

## 2020-07-27 ENCOUNTER — Ambulatory Visit (HOSPITAL_COMMUNITY)
Admission: RE | Admit: 2020-07-27 | Discharge: 2020-07-27 | Disposition: A | Discharge status: Home / Self Care | Payer: Medicare HMO | Source: Ambulatory Visit | Attending: Cardiology | Admitting: Cardiology

## 2020-07-27 ENCOUNTER — Encounter (HOSPITAL_COMMUNITY): Payer: Self-pay | Admitting: Cardiology

## 2020-07-27 ENCOUNTER — Other Ambulatory Visit: Payer: Self-pay

## 2020-07-27 VITALS — BP 130/70 | HR 88 | Wt 177.8 lb

## 2020-07-27 DIAGNOSIS — Z833 Family history of diabetes mellitus: Secondary | ICD-10-CM | POA: Insufficient documentation

## 2020-07-27 DIAGNOSIS — I2510 Atherosclerotic heart disease of native coronary artery without angina pectoris: Secondary | ICD-10-CM | POA: Diagnosis not present

## 2020-07-27 DIAGNOSIS — Z951 Presence of aortocoronary bypass graft: Secondary | ICD-10-CM | POA: Diagnosis not present

## 2020-07-27 DIAGNOSIS — Z885 Allergy status to narcotic agent status: Secondary | ICD-10-CM | POA: Diagnosis not present

## 2020-07-27 DIAGNOSIS — Z7984 Long term (current) use of oral hypoglycemic drugs: Secondary | ICD-10-CM | POA: Insufficient documentation

## 2020-07-27 DIAGNOSIS — Z8249 Family history of ischemic heart disease and other diseases of the circulatory system: Secondary | ICD-10-CM | POA: Insufficient documentation

## 2020-07-27 DIAGNOSIS — I255 Ischemic cardiomyopathy: Secondary | ICD-10-CM | POA: Diagnosis not present

## 2020-07-27 DIAGNOSIS — I252 Old myocardial infarction: Secondary | ICD-10-CM | POA: Diagnosis not present

## 2020-07-27 DIAGNOSIS — Z944 Liver transplant status: Secondary | ICD-10-CM | POA: Insufficient documentation

## 2020-07-27 DIAGNOSIS — I130 Hypertensive heart and chronic kidney disease with heart failure and stage 1 through stage 4 chronic kidney disease, or unspecified chronic kidney disease: Secondary | ICD-10-CM | POA: Insufficient documentation

## 2020-07-27 DIAGNOSIS — I480 Paroxysmal atrial fibrillation: Secondary | ICD-10-CM | POA: Insufficient documentation

## 2020-07-27 DIAGNOSIS — N183 Chronic kidney disease, stage 3 unspecified: Secondary | ICD-10-CM | POA: Insufficient documentation

## 2020-07-27 DIAGNOSIS — Z79899 Other long term (current) drug therapy: Secondary | ICD-10-CM | POA: Insufficient documentation

## 2020-07-27 DIAGNOSIS — Z7901 Long term (current) use of anticoagulants: Secondary | ICD-10-CM | POA: Insufficient documentation

## 2020-07-27 DIAGNOSIS — I5022 Chronic systolic (congestive) heart failure: Secondary | ICD-10-CM | POA: Insufficient documentation

## 2020-07-27 DIAGNOSIS — Z87891 Personal history of nicotine dependence: Secondary | ICD-10-CM | POA: Diagnosis not present

## 2020-07-27 DIAGNOSIS — E1122 Type 2 diabetes mellitus with diabetic chronic kidney disease: Secondary | ICD-10-CM | POA: Diagnosis not present

## 2020-07-27 DIAGNOSIS — Z7982 Long term (current) use of aspirin: Secondary | ICD-10-CM | POA: Insufficient documentation

## 2020-07-27 LAB — BASIC METABOLIC PANEL
Anion gap: 8 (ref 5–15)
BUN: 26 mg/dL — ABNORMAL HIGH (ref 8–23)
CO2: 26 mmol/L (ref 22–32)
Calcium: 8.9 mg/dL (ref 8.9–10.3)
Chloride: 104 mmol/L (ref 98–111)
Creatinine, Ser: 1.56 mg/dL — ABNORMAL HIGH (ref 0.61–1.24)
GFR, Estimated: 50 mL/min — ABNORMAL LOW (ref >60)
Glucose, Bld: 126 mg/dL — ABNORMAL HIGH (ref 70–99)
Potassium: 5.1 mmol/L (ref 3.5–5.1)
Sodium: 138 mmol/L (ref 135–145)

## 2020-07-27 LAB — CBC
HCT: 40.3 % (ref 39.0–52.0)
Hemoglobin: 12.5 g/dL — ABNORMAL LOW (ref 13.0–17.0)
MCH: 27.1 pg (ref 26.0–34.0)
MCHC: 31 g/dL (ref 30.0–36.0)
MCV: 87.2 fL (ref 80.0–100.0)
Platelets: 219 10*3/uL (ref 150–400)
RBC: 4.62 MIL/uL (ref 4.22–5.81)
RDW: 16.6 % — ABNORMAL HIGH (ref 11.5–15.5)
WBC: 7.6 10*3/uL (ref 4.0–10.5)
nRBC: 0 % (ref 0.0–0.2)

## 2020-07-27 MED ORDER — CARVEDILOL 6.25 MG PO TABS: 6.2500 mg | ORAL_TABLET | Freq: Two times a day (BID) | ORAL | 3 refills | Status: DC

## 2020-07-27 MED ORDER — SPIRONOLACTONE 25 MG PO TABS: 25.0000 mg | ORAL_TABLET | Freq: Every day | ORAL | 3 refills | Status: DC

## 2020-07-27 NOTE — Progress Notes (Signed)
Medication Samples have been provided to the patient.  Drug name: Eliquis       Strength: 5mg         Qty: 4  LOT: [id number] =  Exp.Date: 09/2022  Dosing instructions: take 1 tab po bid  The patient has been instructed regarding the correct time, dose, and frequency of taking this medication, including desired effects and most common side effects.   Amberlin Utke R Chelsey Redondo 22:57 AM 07/27/2020

## 2020-07-27 NOTE — Patient Instructions (Addendum)
Labs done today. We will contact you only if your labs are abnormal.  INCREASE Spironolactone to 25mg  (1 tablet) by mouth daily.  INCREASE Carvedilol to 6.25mg  (1 tablet) by mouth 2 times daily.  No other medication changes were made. Please continue all current medications as prescribed.  Your physician recommends that you schedule a follow-up appointment in: 10 days for a lab only appointment in [place],3 weeks for an appointment with our Clinic Pharmacist, Lauren and in 2 months for an appointment with Dr. [name] with an echo prior to your exam.  Your physician has requested that you have an echocardiogram. Echocardiography is a painless test that uses sound waves to create images of your heart. It provides your doctor with information about the size and shape of your heart and how well your heart's chambers and valves are working. This procedure takes approximately one hour. There are no restrictions for this procedure.   If you have any questions or concerns before your next appointment please send [place] a message through [place] or call our office at [phone].    TO LEAVE A MESSAGE FOR THE NURSE SELECT OPTION 2, PLEASE LEAVE A MESSAGE INCLUDING: . YOUR NAME . DATE OF BIRTH . CALL BACK NUMBER . REASON FOR CALL**this is important as we prioritize the call backs  YOU WILL RECEIVE A CALL BACK THE SAME DAY AS LONG AS YOU CALL BEFORE 4:00 PM   Do the following things EVERYDAY: 1) Weigh yourself in the morning before breakfast. Write it down and keep it in a log. 2) Take your medicines as prescribed 3) Eat low salt foods-Limit salt (sodium) to 2000 mg per day.  4) Stay as active as you can everyday 5) Limit all fluids for the day to less than 2 liters   At the [facility], you and your health needs are our priority. As part of our continuing mission to provide you with exceptional heart care, we have created designated Provider Care Teams. These Care Teams include your  primary Cardiologist (physician) and Advanced Practice Providers (APPs- Physician Assistants and Nurse Practitioners) who all work together to provide you with the care you need, when you need it.   You may see any of the following providers on your designated Care Team at your next follow up: Marland Kitchen Dr Glori Bickers . Dr Loralie Champagne . Darrick Grinder, NP . Lyda Jester, PA . Audry Riles, PharmD   Please be sure to bring in all your medications bottles to every appointment.

## 2020-07-27 NOTE — Progress Notes (Signed)
Advanced Heart Failure Clinic Note   Referring Physician: PCP: Monico Blitz, MD HF Cardiology: Dr. [name]   HPI: 65 y.o. male w/ h/o HCV s/p liver transplant in 2009(followed at [facility]), Stage III CKD, HTN and T2DM.   He was admitted 7/21 w/ NSTEMI and Afib w/ RVR, found to have severe 3VD and biventricular heart failure, LVEF 20-25%, RV moderately reduced. Underwent CABG + MAZE + LA appendage clipping 12/19/19. Required milrinone post operatively. Post op course complicated by ATN/ renal failure. Required CVVHD but remained anuric. Tunneled HD cath placed and transitioned to iHD, MWF.  He was able to transition off HD as an outpatient.   He had repeat echo done in 10/21 with EF 20-25%, moderate LV dilation, mild-moderately decreased RV systolic function, moderate MR.   He returns today for followup of CHF and CAD.  He is in NSR today.  He has been doing very well.  He does 30 minutes of cardio exercise 4 times a week.  He does weight-training several days/week. No exertional dyspnea or chest pain.  Weight is stable.  No BRBPR/melena.  No lightheadedness.  He talked to EP and does not want an ICD.    ECG (personally reviewed): NSR, nonspecific T wave flattening.   Labs (9/21): LDL 66, HDL 44 Labs (10/21): K 3.9, creatinine 2.02 Labs (12/21): K 3.7, creatinine 1.58 Labs (9/21): LDL 66 Labs (1/22): K 4.1, creatinine 1.6  Review of systems complete and found to be negative unless listed in HPI.    PMH: 1. CKD stage III: He was on HD after CABG in 7/21 but renal function recovered.  2. HCV with cirrhosis, s/p liver transplant in 2009.  3. HTN 4. Psoriasis 5. Type 2 diabetes 6. Hyperlipidemia 7. CAD: NSTEMI 7/21.  - CABG (7/21) with LIMA-LAD, RIMA-ramus, sequential SVG-PDA and PLV, SVG -D 8. Atrial fibrillation: Paroxysmal.  - S/p Maze and LA appendage clip with CABG in 7/21.  9. Chronic systolic CHF: Ischemic cardiomyopathy.  - Echo (10/21): EF 20-25%, moderate LV dilation,  mild-moderately decreased RV systolic function, moderate MR.   Current Outpatient Medications  Medication Sig Dispense Refill  . apixaban (ELIQUIS) 5 MG TABS tablet Take 1 tablet (5 mg total) by mouth 2 (two) times daily. 60 tablet 2  . Apremilast (OTEZLA) 30 MG TABS Take 1 tablet by mouth 2 (two) times daily.    Marland Kitchen aspirin EC 81 MG EC tablet Take 1 tablet (81 mg total) by mouth daily. Swallow whole.    Marland Kitchen atorvastatin (LIPITOR) 80 MG tablet Take 1 tablet (80 mg total) by mouth daily. 30 tablet 11  . dapagliflozin propanediol (FARXIGA) 10 MG TABS tablet Take 1 tablet (10 mg total) by mouth daily before breakfast. 30 tablet 3  . furosemide (LASIX) 40 MG tablet Take 1 tablet (40 mg total) by mouth daily. 45 tablet 1  . LORazepam (ATIVAN) 1 MG tablet Take 1 mg by mouth 2 (two) times daily as needed (panic attacks).     . sacubitril-valsartan (ENTRESTO) 24-26 MG Take 1 tablet by mouth 2 (two) times daily. 60 tablet 3  . tacrolimus (PROGRAF) 1 MG capsule Take 1 mg by mouth 2 (two) times daily.    . temazepam (RESTORIL) 30 MG capsule Take 30 mg by mouth at bedtime.    . triamcinolone (KENALOG) 0.1 % Apply 1 application topically 2 (two) times daily.    Marland Kitchen venlafaxine XR (EFFEXOR-XR) 75 MG 24 hr capsule Take 75 mg by mouth daily.    . carvedilol (  COREG) 6.25 MG tablet Take 1 tablet (6.25 mg total) by mouth 2 (two) times daily. 180 tablet 3  . spironolactone (ALDACTONE) 25 MG tablet Take 1 tablet (25 mg total) by mouth daily. 90 tablet 3   No current facility-administered medications for this encounter.    Allergies  Allergen Reactions  . Morphine And Related Nausea And Vomiting      Social History   Socioeconomic History  . Marital status: Widowed    Spouse name: Not on file  . Number of children: Not on file  . Years of education: Not on file  . Highest education level: Not on file  Occupational History  . Not on file  Tobacco Use  . Smoking status: Former Smoker    Packs/day: 1.00     Types: Cigarettes    Quit date: 09/04/2007    Years since quitting: 12.9  . Smokeless tobacco: Never Used  Vaping Use  . Vaping Use: Never used  Substance and Sexual Activity  . Alcohol use: No  . Drug use: No  . Sexual activity: Yes    Birth control/protection: None  Other Topics Concern  . Not on file  Social History Narrative  . Not on file   Social Determinants of Health   Financial Resource Strain: Not on file  Food Insecurity: No Food Insecurity  . Worried About Charity fundraiser in the Last Year: Never true  . Ran Out of Food in the Last Year: Never true  Transportation Needs: No Transportation Needs  . Lack of Transportation (Medical): No  . Lack of Transportation (Non-Medical): No  Physical Activity: Not on file  Stress: Not on file  Social Connections: Not on file  Intimate Partner Violence: Not on file      Family History  Problem Relation Age of Onset  . Diabetes Mother   . Heart disease Father   . Heart disease Son     Vitals:   07/27/20 1053  BP: 130/70  Pulse: 88  SpO2: 99%  Weight: 80.6 kg (177 lb 12.8 oz)   PHYSICAL EXAM: General: NAD Neck: No JVD, no thyromegaly or thyroid nodule.  Lungs: Clear to auscultation bilaterally with normal respiratory effort. CV: Nondisplaced PMI.  Heart regular S1/S2, no S3/S4, no murmur.  No peripheral edema.  No carotid bruit.  Normal pedal pulses.  Abdomen: Soft, nontender, no hepatosplenomegaly, no distention.  Skin: Intact without lesions or rashes.  Neurologic: Alert and oriented x 3.  Psych: Normal affect. Extremities: No clubbing or cyanosis.  HEENT: Normal.   ASSESSMENT & PLAN:  1. CAD: NSTEMI in 7/21. Suspect out of hospital inferolateral MI. Cardiac MRI showed that the inferolateral wall was likely not viable, but the remainder of the LV myocardium was likely viable. He had CABG 12/19/19 with LIMA-LAD, RIMA-ramus, seq SVG-PD/PL, SVG-D.  No chest pain.  - Continue ASA 81 mg daily  - Continue  atorvastatin 80 mg qhs. Good lipids in 9/21.  2. Chronic Systolic CHF: Ischemic cardiomyopathy.  Echo 7/21 with EF 20-25%, moderately decreased RV systolic function. Cardiac MRI with LV EF 26%, RV EF 38%, near full thickness scar in inferolateral wall suggesting lack of viability in the LCx territory.  Now post-CABG.  Repeat echo in 10/21 showed EF 20-25%, moderate LV dilation, mild-moderately decreased RV systolic function, moderate MR.  Doing well currently, NYHA class II symptoms.  He is not volume overloaded.  Creatinine 1.6 when last checked.  - Increase Coreg to 6.25 mg bid.    -  Continue Entresto 24/26 bid.  - Continue Lasix 40 mg daily.  - Continue Farxiga 10 mg daily.  - Increase spironolactone to 25 mg daily. BMET today and 10 days.   - He talked to EP and does not want an ICD.  Not CRT candidate with narrow QRS.  - Repeat echo in 2 months. 3. Atrial fibrillation:  S/p Maze + LAA clipping on 7/16.  He is off amiodarone.  He is in NSR today.  - Continue Eliquis 5 mg bid.  4. CKD: Stage 3.  He required HD after CABG but renal function recovered. Follow BMET closely.  5. Liver transplant for HCV: Followed at [facility]. On Tacrolimus.   Followup with HF pharmacist in 3 wks x 2 visits for medication titration.  See me in 2 months with echo.  Loralie Champagne, MD 07/27/20

## 2020-08-02 DIAGNOSIS — I482 Chronic atrial fibrillation, unspecified: Secondary | ICD-10-CM | POA: Diagnosis not present

## 2020-08-02 DIAGNOSIS — Z789 Other specified health status: Secondary | ICD-10-CM | POA: Diagnosis not present

## 2020-08-02 DIAGNOSIS — K219 Gastro-esophageal reflux disease without esophagitis: Secondary | ICD-10-CM | POA: Diagnosis not present

## 2020-08-02 DIAGNOSIS — Z299 Encounter for prophylactic measures, unspecified: Secondary | ICD-10-CM | POA: Diagnosis not present

## 2020-08-02 DIAGNOSIS — I10 Essential (primary) hypertension: Secondary | ICD-10-CM | POA: Diagnosis not present

## 2020-08-02 DIAGNOSIS — R69 Illness, unspecified: Secondary | ICD-10-CM | POA: Diagnosis not present

## 2020-08-06 ENCOUNTER — Telehealth (HOSPITAL_COMMUNITY): Payer: Self-pay

## 2020-08-06 NOTE — Telephone Encounter (Signed)
[email or web address] mailed to address on file

## 2020-08-06 NOTE — Telephone Encounter (Signed)
-----   Message from Larey Dresser, MD sent at 07/27/2020  3:06 PM EST ----- Stable creatinine but K is up some.  Follow very low K diet, and with increase in spironolactone, would repeat BMET on Friday .

## 2020-08-09 ENCOUNTER — Encounter (HOSPITAL_COMMUNITY): Payer: Self-pay

## 2020-08-10 ENCOUNTER — Encounter (HOSPITAL_COMMUNITY): Payer: Self-pay | Admitting: *Deleted

## 2020-08-17 DIAGNOSIS — I509 Heart failure, unspecified: Secondary | ICD-10-CM | POA: Diagnosis not present

## 2020-08-18 NOTE — Progress Notes (Incomplete)
***In progress*** PCP: Monico Blitz, MD HF Cardiology: Dr. [name]   HPI:  65 y.o. male w/ h/o HCV s/p liver transplant in 2009(followed at [facility]), Stage III CKD, HTN and T2DM.   He was admitted 7/21 w/ NSTEMI and Afib w/ RVR, found to have severe 3VD and biventricular heart failure, LVEF 20-25%, RV moderately reduced. Underwent CABG + MAZE + LA appendage clipping 12/19/19. Required milrinone post operatively. Post op course complicated by ATN/ renal failure. Required CVVHD but remained anuric. Tunneled HD cath placed and transitioned to iHD, MWF.  He was able to transition off HD as an outpatient.   He had repeat echo done in 10/21 with EF 20-25%, moderate LV dilation, mild-moderately decreased RV systolic function, moderate MR.   He recently returned to HF Clinic for follow up of CHF and CAD on 07/27/20.  He was in NSR.  He had been doing very well.  He reported doing 30 minutes of cardio exercise 4 times a week.  He does weight-training several days/week. No exertional dyspnea or chest pain.  Weight was stable.  No BRBPR/melena.  No lightheadedness.  He talked to EP and did not want an ICD.    Today he returns to HF clinic for pharmacist medication titration. At last visit with MD, carvedilol was increased to 6.25 mg BID and spironolactone was increased to 25 mg daily. His labs returned with elevated potassium (5.1) so he was instructed to follow low potassium diet and repeat BMET in 1 week in [place]. Repeat labs on Labs 08/27/20 at [facility] showed K 4.6, Scr 2.09.  Overall feeling ***. Dizziness, lightheadedness, fatigue:  Chest pain or palpitations:  How is your breathing?: *** SOB: Able to complete all ADLs. Activity level ***  Weight at home pounds. Takes furosemide/torsemide/bumex *** mg *** daily.  LEE PND/Orthopnea  Appetite *** Low-salt diet:   Physical Exam Cost/affordability of meds -Labs 3.25: K 4.6, Scr 2.09 (inc 1.68 -inc coreg 9.375 vs inc entresto [employer]; and dec Lsx  20) -DM 4/26    HF Medications: Carvedilol 6.25 mg BID Entresto 24/26 mg BID Spironolactone 25 mg daily Farxiga 10 mg daily Furosemide 40 mg daily  Has the patient been experiencing any side effects to the medications prescribed?  no  Does the patient have any problems obtaining medications due to transportation or finances?   Yes - has [employer] but [place] and [place] copays still expensive. Recently approved for [employer] patient assistance for [place] patient assistance with [employer].    Understanding of regimen: good Understanding of indications: good Potential of compliance: good Patient understands to avoid NSAIDs. Patient understands to avoid decongestants.    Pertinent Lab Values:*** . 04/02/20: Serum creatinine 2.02, BUN 18, Potassium 3.9, Sodium 140 . BMET today pending  Vital Signs: . Weight: 178.8 lbs (last clinic weight: 176 lbs) . Blood pressure: 118/66  . Heart rate: 90   Assessment: 1. CAD: NSTEMI in 7/21. Suspect out of hospital inferolateral MI. Cardiac MRI showed that the inferolateral wall was likely not viable, but the remainder of the LV myocardium was likely viable. He had CABG 12/19/19 with LIMA-LAD, RIMA-ramus, seq SVG-PD/PL, SVG-D.  No chest pain.  - Continue ASA 81 mg daily  - Continue atorvastatin 80 mg qhs. Good lipids in 9/21.  2. Chronic Systolic CHF: Ischemic cardiomyopathy.  Echo 7/21 with EF 20-25%, moderately decreased RV systolic function. Cardiac MRI with LV EF 26%, RV EF 38%, near full thickness scar in inferolateral wall suggesting lack of  viability in the LCx territory. Now post-CABG.  Repeat echo in 03/2020 showed EF 20-25%, moderate LV dilation, mild-moderately decreased RV systolic function, moderate MR.   - NYHA class II symptoms.  He is not volume overloaded on exam.   - Continue furosemide 40 mg daily  -Continue carvedilol 6.25 mg BID.  - Continue Entresto 24/26 mg BID.  - Continue spironolactone 25  mg daily. Monitor K closely as patient is also on tacrolimus*** - Continue Farxiga 10 mg daily.  - Referred to EP for ICD.  Narrow QRS, not CRT candidate.  3. Atrial fibrillation:  S/p Maze + LAA clipping on 7/16.  He is off amiodarone.  He is in NSR today.  -Continue Eliquis 5 mg BID. Denies abnormal bleeding.   4. CKD: Stage 3.  He required HD after CABG but renal function recovered. Follow BMET closely.  - BMET today pending 5. Liver transplant for HCV: Followed at [facility]. On Tacrolimus.    Plan: 1) Medication changes: Based on clinical presentation, vital signs and recent labs will **** 2) Follow-up:  Clinic visit in 1 month with Dr. [name].***    Audry Riles, PharmD, BCPS, [facility], CPP Heart Failure Clinic Pharmacist [phone]

## 2020-08-20 ENCOUNTER — Other Ambulatory Visit: Payer: Self-pay | Admitting: Family Medicine

## 2020-08-20 ENCOUNTER — Encounter (HOSPITAL_COMMUNITY): Payer: Self-pay

## 2020-08-23 ENCOUNTER — Other Ambulatory Visit (HOSPITAL_COMMUNITY): Payer: Self-pay | Admitting: *Deleted

## 2020-08-27 DIAGNOSIS — Z944 Liver transplant status: Secondary | ICD-10-CM | POA: Diagnosis not present

## 2020-09-01 ENCOUNTER — Inpatient Hospital Stay (HOSPITAL_COMMUNITY)
Admission: RE | Admit: 2020-09-01 | Discharge: 2020-09-01 | Disposition: A | Discharge status: Home / Self Care | Payer: Medicare HMO | Source: Ambulatory Visit

## 2020-09-03 ENCOUNTER — Other Ambulatory Visit (HOSPITAL_COMMUNITY): Payer: Self-pay | Admitting: Cardiology

## 2020-09-21 NOTE — Addendum Note (Signed)
Encounter addended by: Orma Render, RPH-CPP on: 09/21/2020 10:44 AM  Actions taken: Pend clinical note, Delete clinical note

## 2020-09-28 ENCOUNTER — Ambulatory Visit (HOSPITAL_BASED_OUTPATIENT_CLINIC_OR_DEPARTMENT_OTHER)
Admission: RE | Admit: 2020-09-28 | Discharge: 2020-09-28 | Disposition: A | Discharge status: Home / Self Care | Payer: Medicare HMO | Source: Ambulatory Visit | Attending: Internal Medicine | Admitting: Internal Medicine

## 2020-09-28 ENCOUNTER — Other Ambulatory Visit: Payer: Self-pay

## 2020-09-28 ENCOUNTER — Ambulatory Visit (HOSPITAL_COMMUNITY)
Admission: RE | Admit: 2020-09-28 | Discharge: 2020-09-28 | Disposition: A | Discharge status: Home / Self Care | Payer: Medicare HMO | Source: Ambulatory Visit | Attending: Cardiology | Admitting: Cardiology

## 2020-09-28 ENCOUNTER — Encounter (HOSPITAL_COMMUNITY): Payer: Self-pay | Admitting: Cardiology

## 2020-09-28 VITALS — BP 104/68 | HR 77 | Wt 172.2 lb

## 2020-09-28 DIAGNOSIS — I130 Hypertensive heart and chronic kidney disease with heart failure and stage 1 through stage 4 chronic kidney disease, or unspecified chronic kidney disease: Secondary | ICD-10-CM | POA: Insufficient documentation

## 2020-09-28 DIAGNOSIS — E1122 Type 2 diabetes mellitus with diabetic chronic kidney disease: Secondary | ICD-10-CM | POA: Insufficient documentation

## 2020-09-28 DIAGNOSIS — N183 Chronic kidney disease, stage 3 unspecified: Secondary | ICD-10-CM | POA: Diagnosis not present

## 2020-09-28 DIAGNOSIS — I2510 Atherosclerotic heart disease of native coronary artery without angina pectoris: Secondary | ICD-10-CM | POA: Insufficient documentation

## 2020-09-28 DIAGNOSIS — I5022 Chronic systolic (congestive) heart failure: Secondary | ICD-10-CM | POA: Diagnosis not present

## 2020-09-28 LAB — BASIC METABOLIC PANEL
Anion gap: 9 (ref 5–15)
BUN: 41 mg/dL — ABNORMAL HIGH (ref 8–23)
CO2: 23 mmol/L (ref 22–32)
Calcium: 8.9 mg/dL (ref 8.9–10.3)
Chloride: 103 mmol/L (ref 98–111)
Creatinine, Ser: 1.98 mg/dL — ABNORMAL HIGH (ref 0.61–1.24)
GFR, Estimated: 37 mL/min — ABNORMAL LOW (ref >60)
Glucose, Bld: 119 mg/dL — ABNORMAL HIGH (ref 70–99)
Potassium: 5.1 mmol/L (ref 3.5–5.1)
Sodium: 135 mmol/L (ref 135–145)

## 2020-09-28 LAB — IRON AND TIBC
Iron: 68 ug/dL (ref 45–182)
Saturation Ratios: 20 % (ref 17.9–39.5)
TIBC: 349 ug/dL (ref 250–450)
UIBC: 281 ug/dL

## 2020-09-28 LAB — ECHOCARDIOGRAM COMPLETE
Area-P 1/2: 3.12 cm2
S' Lateral: 4.6 cm

## 2020-09-28 LAB — LIPID PANEL
Cholesterol: 126 mg/dL (ref 0–200)
HDL: 50 mg/dL (ref >40)
LDL Cholesterol: 66 mg/dL (ref 0–99)
Total CHOL/HDL Ratio: 2.5 RATIO
Triglycerides: 48 mg/dL (ref <150)
VLDL: 10 mg/dL (ref 0–40)

## 2020-09-28 LAB — CBC
HCT: 40.3 % (ref 39.0–52.0)
Hemoglobin: 13.1 g/dL (ref 13.0–17.0)
MCH: 28.9 pg (ref 26.0–34.0)
MCHC: 32.5 g/dL (ref 30.0–36.0)
MCV: 88.8 fL (ref 80.0–100.0)
Platelets: 259 10*3/uL (ref 150–400)
RBC: 4.54 MIL/uL (ref 4.22–5.81)
RDW: 14.6 % (ref 11.5–15.5)
WBC: 7.1 10*3/uL (ref 4.0–10.5)
nRBC: 0 % (ref 0.0–0.2)

## 2020-09-28 LAB — FERRITIN: Ferritin: 25 ng/mL (ref 24–336)

## 2020-09-28 MED ORDER — SPIRONOLACTONE 25 MG PO TABS
25.0000 mg | ORAL_TABLET | Freq: Every day | ORAL | 3 refills | Status: DC
Start: 2020-09-28 — End: 2021-08-26

## 2020-09-28 MED ORDER — FUROSEMIDE 20 MG PO TABS: 20.0000 mg | ORAL_TABLET | Freq: Every day | ORAL | 3 refills | Status: DC

## 2020-09-28 MED ORDER — SILDENAFIL CITRATE 50 MG PO TABS: 50.0000 mg | ORAL_TABLET | ORAL | 1 refills | Status: DC | PRN

## 2020-09-28 NOTE — Progress Notes (Signed)
  Echocardiogram 2D Echocardiogram has been performed.  Joseph Greer 09/28/2020, 11:26 AM

## 2020-09-28 NOTE — Patient Instructions (Addendum)
EKG done today.  Labs done today. We will contact you only if your labs are abnormal.  STOP taking Asprin  START Viagra 50mg  (1 tablet) as needed  START taking spironolactone daily at bedtime.  DECREASE Lasix to 20mg  (1 tablet) by mouth daily.  No other medication changes were made. Please continue all current medications as prescribed.  You have been referred to Cardiac Electrophysiology. They will contact you to schedule an appointment.    Your physician recommends that you schedule a follow-up appointment in: 3 months  If you have any questions or concerns before your next appointment please send [place] a message through [place] or call our office at [phone].    TO LEAVE A MESSAGE FOR THE NURSE SELECT OPTION 2, PLEASE LEAVE A MESSAGE INCLUDING: . YOUR NAME . DATE OF BIRTH . CALL BACK NUMBER . REASON FOR CALL**this is important as we prioritize the call backs  YOU WILL RECEIVE A CALL BACK THE SAME DAY AS LONG AS YOU CALL BEFORE 4:00 PM   Do the following things EVERYDAY: 1) Weigh yourself in the morning before breakfast. Write it down and keep it in a log. 2) Take your medicines as prescribed 3) Eat low salt foods--Limit salt (sodium) to 2000 mg per day.  4) Stay as active as you can everyday 5) Limit all fluids for the day to less than 2 liters   At the [facility], you and your health needs are our priority. As part of our continuing mission to provide you with exceptional heart care, we have created designated Provider Care Teams. These Care Teams include your primary Cardiologist (physician) and Advanced Practice Providers (APPs- Physician Assistants and Nurse Practitioners) who all work together to provide you with the care you need, when you need it.   You may see any of the following providers on your designated Care Team at your next follow up: Marland Kitchen Dr Glori Bickers . Dr Loralie Champagne . Darrick Grinder, NP . Lyda Jester, PA . Audry Riles,  PharmD   Please be sure to bring in all your medications bottles to every appointment.

## 2020-09-29 NOTE — Progress Notes (Signed)
Advanced Heart Failure Clinic Note   Referring Physician: PCP: Monico Blitz, MD HF Cardiology: Dr. [name]   HPI: 65 y.o. male w/ h/o HCV s/p liver transplant in 2009(followed at [facility]), Stage III CKD, HTN and T2DM.   He was admitted 7/21 w/ NSTEMI and Afib w/ RVR, found to have severe 3VD and biventricular heart failure, LVEF 20-25%, RV moderately reduced. Underwent CABG + MAZE + LA appendage clipping 12/19/19. Required milrinone post operatively. Post op course complicated by ATN/ renal failure. Required CVVHD but remained anuric. Tunneled HD cath placed and transitioned to iHD, MWF.  He was able to transition off HD as an outpatient.   He had repeat echo done in 10/21 with EF 20-25%, moderate LV dilation, mild-moderately decreased RV systolic function, moderate MR.   Echo today showed EF 30%, mid anterior and inferior severe hypokinesis, basal to mid anterolateral and inferolateral akinesis, mildly decreased RV systolic function.   He returns today for followup of CHF and CAD.  He is in NSR today.  No exertional dyspnea, he works out four times/week at [employer], does 30 minutes of cardio.  No orthopnea/PND. No chest pain.  He notes lightheadedness if he stands too fast.   ECG (personally reviewed): NSR, T wave inversions.   Labs (9/21): LDL 66, HDL 44 Labs (10/21): K 3.9, creatinine 2.02 Labs (12/21): K 3.7, creatinine 1.58 Labs (9/21): LDL 66 Labs (1/22): K 4.1, creatinine 1.6 Labs (3/22): K 4.6, creatinine 1.56 => 2.09  Review of systems complete and found to be negative unless listed in HPI.    PMH: 1. CKD stage III: He was on HD after CABG in 7/21 but renal function recovered.  2. HCV with cirrhosis, s/p liver transplant in 2009.  3. HTN 4. Psoriasis 5. Type 2 diabetes 6. Hyperlipidemia 7. CAD: NSTEMI 7/21.  - CABG (7/21) with LIMA-LAD, RIMA-ramus, sequential SVG-PDA and PLV, SVG -D 8. Atrial fibrillation: Paroxysmal.  - S/p Maze and LA appendage clip with CABG in 7/21.   9. Chronic systolic CHF: Ischemic cardiomyopathy.  - Echo (10/21): EF 20-25%, moderate LV dilation, mild-moderately decreased RV systolic function, moderate MR.  - Echo (4/22): EF 30%, mid anterior and inferior severe hypokinesis, basal to mid anterolateral and inferolateral akinesis, mildly decreased RV systolic function.  Current Outpatient Medications  Medication Sig Dispense Refill  . Apremilast (OTEZLA) 30 MG TABS Take 1 tablet by mouth 2 (two) times daily.    Marland Kitchen atorvastatin (LIPITOR) 80 MG tablet Take 1 tablet (80 mg total) by mouth daily. 30 tablet 11  . carvedilol (COREG) 6.25 MG tablet Take 1 tablet (6.25 mg total) by mouth 2 (two) times daily. 180 tablet 3  . dapagliflozin propanediol (FARXIGA) 10 MG TABS tablet Take 1 tablet (10 mg total) by mouth daily before breakfast. 30 tablet 3  . ELIQUIS 5 MG TABS tablet Take 1 tablet by mouth twice daily 60 tablet 6  . LORazepam (ATIVAN) 1 MG tablet Take 1 mg by mouth 2 (two) times daily as needed (panic attacks).     . sacubitril-valsartan (ENTRESTO) 24-26 MG Take 1 tablet by mouth 2 (two) times daily. 60 tablet 3  . sildenafil (VIAGRA) 50 MG tablet Take 1 tablet (50 mg total) by mouth as needed for erectile dysfunction. 20 tablet 1  . tacrolimus (PROGRAF) 1 MG capsule Take 2 mg by mouth 2 (two) times daily. 1 mg in the evening    . temazepam (RESTORIL) 30 MG capsule Take 30 mg by mouth at bedtime.    Marland Kitchen  triamcinolone cream (KENALOG) 0.1 % Apply 1 application topically 2 (two) times daily.    Marland Kitchen venlafaxine XR (EFFEXOR-XR) 75 MG 24 hr capsule Take 75 mg by mouth daily.    . furosemide (LASIX) 20 MG tablet Take 1 tablet (20 mg total) by mouth daily. 90 tablet 3  . spironolactone (ALDACTONE) 25 MG tablet Take 1 tablet (25 mg total) by mouth at bedtime. 90 tablet 3   No current facility-administered medications for this encounter.    Allergies  Allergen Reactions  . Morphine And Related Nausea And Vomiting      Social History    Socioeconomic History  . Marital status: Widowed    Spouse name: Not on file  . Number of children: Not on file  . Years of education: Not on file  . Highest education level: Not on file  Occupational History  . Not on file  Tobacco Use  . Smoking status: Former Smoker    Packs/day: 1.00    Types: Cigarettes    Quit date: 09/04/2007    Years since quitting: 13.0  . Smokeless tobacco: Never Used  Vaping Use  . Vaping Use: Never used  Substance and Sexual Activity  . Alcohol use: No  . Drug use: No  . Sexual activity: Yes    Birth control/protection: None  Other Topics Concern  . Not on file  Social History Narrative  . Not on file   Social Determinants of Health   Financial Resource Strain: Not on file  Food Insecurity: No Food Insecurity  . Worried About Charity fundraiser in the Last Year: Never true  . Ran Out of Food in the Last Year: Never true  Transportation Needs: No Transportation Needs  . Lack of Transportation (Medical): No  . Lack of Transportation (Non-Medical): No  Physical Activity: Not on file  Stress: Not on file  Social Connections: Not on file  Intimate Partner Violence: Not on file      Family History  Problem Relation Age of Onset  . Diabetes Mother   . Heart disease Father   . Heart disease Son     Vitals:   09/28/20 1127  BP: 104/68  Pulse: 77  SpO2: 99%  Weight: 78.1 kg (172 lb 3.2 oz)   PHYSICAL EXAM: General: NAD Neck: No JVD, no thyromegaly or thyroid nodule.  Lungs: Clear to auscultation bilaterally with normal respiratory effort. CV: Nondisplaced PMI.  Heart regular S1/S2, no S3/S4, no murmur.  No peripheral edema.  No carotid bruit.  Normal pedal pulses.  Abdomen: Soft, nontender, no hepatosplenomegaly, no distention.  Skin: Intact without lesions or rashes.  Neurologic: Alert and oriented x 3.  Psych: Normal affect. Extremities: No clubbing or cyanosis.  HEENT: Normal.   ASSESSMENT & PLAN:  1. CAD: NSTEMI in 7/21.  Suspect out of hospital inferolateral MI. Cardiac MRI showed that the inferolateral wall was likely not viable, but the remainder of the LV myocardium was likely viable. He had CABG 12/19/19 with LIMA-LAD, RIMA-ramus, seq SVG-PD/PL, SVG-D.  No chest pain.  - Can stop ASA at this point given Eliquis use.  - Continue atorvastatin 80 mg qhs. Good lipids in 9/21.  2. Chronic Systolic CHF: Ischemic cardiomyopathy.  Echo 7/21 with EF 20-25%, moderately decreased RV systolic function. Cardiac MRI with LV EF 26%, RV EF 38%, near full thickness scar in inferolateral wall suggesting lack of viability in the LCx territory.  Now post-CABG.  Echo in 10/21 showed EF 20-25%, moderate LV dilation,  mild-moderately decreased RV systolic function, moderate MR.  Echo today showed EF 30%, mid anterior and inferior severe hypokinesis, basal to mid anterolateral and inferolateral akinesis, mildly decreased RV systolic function.  Doing well currently, NYHA class II symptoms.  He is not volume overloaded.  Creatinine 2.09 when last checked. No BP room to increase meds, occasional orthostatic hypotension.  - Continue Coreg 6.25 mg bid.    - Continue Entresto 24/26 bid.  - Decrease Lasix to 20 mg daily.  BMET today.   - Continue Farxiga 10 mg daily.  - Move spironolactone to qhs.    - He is willing to talk with EP again about ICD, he is now leaning towards getting one.  Not CRT candidate with narrow QRS.  3. Atrial fibrillation:  S/p Maze + LAA clipping on 7/16.  He is off amiodarone.  He is in NSR today.  - Continue Eliquis 5 mg bid. CBC tdoay.  4. CKD: Stage 3.  He required HD after CABG but renal function recovered. Follow BMET closely.  - Decreasing Lasix as above.  5. Liver transplant for HCV: Followed at [facility]. On Tacrolimus. ' 6. Erectile dysfunction: Ok to use low dose Viagra.   Followup in 3 months.   Loralie Champagne, MD 09/29/20

## 2020-10-06 ENCOUNTER — Telehealth (HOSPITAL_COMMUNITY): Payer: Self-pay

## 2020-10-06 DIAGNOSIS — I10 Essential (primary) hypertension: Secondary | ICD-10-CM | POA: Diagnosis not present

## 2020-10-06 DIAGNOSIS — R69 Illness, unspecified: Secondary | ICD-10-CM | POA: Diagnosis not present

## 2020-10-06 DIAGNOSIS — R809 Proteinuria, unspecified: Secondary | ICD-10-CM | POA: Diagnosis not present

## 2020-10-06 DIAGNOSIS — Z992 Dependence on renal dialysis: Secondary | ICD-10-CM | POA: Diagnosis not present

## 2020-10-06 DIAGNOSIS — Z299 Encounter for prophylactic measures, unspecified: Secondary | ICD-10-CM | POA: Diagnosis not present

## 2020-10-06 DIAGNOSIS — G4700 Insomnia, unspecified: Secondary | ICD-10-CM | POA: Diagnosis not present

## 2020-10-06 MED ORDER — FUROSEMIDE 20 MG PO TABS
20.0000 mg | ORAL_TABLET | ORAL | 3 refills | Status: DC
Start: 2020-10-06 — End: 2021-03-23

## 2020-10-06 NOTE — Telephone Encounter (Signed)
Patient advised and verbalized understanding. Patient will have repeat labs drawn at pharmacy appointment. Med list updated to reflect changes.

## 2020-10-06 NOTE — Telephone Encounter (Signed)
-----   Message from Larey Dresser, MD sent at 09/28/2020  1:43 PM EDT ----- Lasix decreased today.  Can decrease further to 20 mg every other day.  BMET 1 week. Low sodium diet.

## 2020-10-06 NOTE — Progress Notes (Incomplete)
***In Progress*** Referring Physician: PCP: Monico Blitz, MD HF Cardiology: Dr. [name]  HPI:  65 y.o. male w/ h/o HCV s/p liver transplant in 2009(followed at [facility]), Stage III CKD, HTN and T2DM. He was admitted 12/2019 w/ NSTEMI and Afib w/ RVR, found to have severe 3VD and biventricular heart failure, LVEF 20-25%, RV moderately reduced. Underwent CABG + MAZE + LA appendage clipping 12/19/19. Required milrinone post operatively. Post-op course complicated by ATN/renal failure. Required CVVHD but remained anuric. Tunneled HD cath placed and transitioned to iHD, MWF. He was able to transition off HD as an outpatient.   He had repeat echo done in 10/21 with EF 20-25%, moderate LV dilation, mild-moderately decreased RV systolic function, moderate MR.   Echo on 09/28/20 showed EF 30%, mid anterior and inferior severe hypokinesis, basal to mid anterolateral and inferolateral akinesis, mildly decreased RV systolic function.   He recently returned for followup of CHF and CAD on 09/28/20 with Dr. [name]. He was in NSR. No exertional dyspnea, he was working out four times/week at [employer], did 30 minutes of cardio. No orthopnea/PND. No chest pain. He noted lightheadedness if he stands too fast.   Today he returns to HF clinic for pharmacist medication titration. At last visit with MD, furosemide was decreased to 20 mg daily and spironolactone was moved to QHS. BP was too low for further medication titration.  104/68, 77, 172 lbs - DM wanted a bmet in 1 week (now) but only decr'd furos - If BP ok and no orthostasis > could incr coreg 9.375 mg BID - If BP low > no changes - Watch orthostasis and volume to see if furos 20 needs to be PRN F/u 7/26 w/ DM  Overall feeling ***. Dizziness, lightheadedness, fatigue:  Chest pain or palpitations:  How is your breathing?: *** SOB: Able to complete all ADLs. Activity level ***  Weight at home pounds. Takes furosemide/torsemide/bumex *** mg *** daily.   LEE PND/Orthopnea  Appetite *** Low-salt diet:   Physical Exam Cost/affordability of meds   HF Medications: Carvedilol 6.25 mg BID Entresto 24/26 mg BID Spironolactone 25 mg daily Farxiga 10 mg daily Furosemide 20 mg daily  Has the patient been experiencing any side effects to the medications prescribed?  {YES NO:22349}  Does the patient have any problems obtaining medications due to transportation or finances?   yes - Has [employer]. Maryan Puls from [employer] and [place] from [place].  Understanding of regimen: {excellent/good/fair/poor:19665} Understanding of indications: {excellent/good/fair/poor:19665} Potential of compliance: {excellent/good/fair/poor:19665} Patient understands to avoid NSAIDs. Patient understands to avoid decongestants.    Pertinent Lab Values: . Serum creatinine ***, BUN ***, Potassium ***, Sodium ***, BNP ***, Magnesium ***, Digoxin ***   Vital Signs: . Weight: *** (last clinic weight: 172 lbs) . Blood pressure: ***  . Heart rate: ***   Assessment/Plan: 1. CAD: NSTEMI in 7/21. Suspect out of hospital inferolateral MI. Cardiac MRI showed that the inferolateral wall was likely not viable, but the remainder of the LV myocardium was likely viable. He had CABG 12/19/19 with LIMA-LAD, RIMA-ramus, seq SVG-PD/PL, SVG-D.  No chest pain.  - Off ASA given Eliquis use.  - Continue atorvastatin 80 mg qhs. Good lipids in 9/21.   2. Chronic Systolic CHF: Ischemic cardiomyopathy.  Echo 7/21 with EF 20-25%, moderately decreased RV systolic function. Cardiac MRI with LV EF 26%, RV EF 38%, near full thickness scar in inferolateral wall suggesting lack of viability in the LCx territory. Now post-CABG.  Echo in 10/21  showed EF 20-25%, moderate LV dilation, mild-moderately decreased RV systolic function, moderate MR.  Echo on 09/28/20 showed EF 30%, mid anterior and inferior severe hypokinesis, basal to mid anterolateral and inferolateral akinesis,  mildly decreased RV systolic function.  - Doing well currently, NYHA class II symptoms.  He is not volume overloaded. - Continue furosemide 20 mg daily - Continue carvedilol 6.25 mg BID - Continue Entresto 24/26 mg BID - Continue spironolactone 25 mg QHS - Continue Farxiga 10 mg daily - He is willing to talk with EP again about ICD, he is now leaning towards getting one. Scheduled to see EP on 10/21/20. Not CRT candidate with narrow QRS.   3. Atrial fibrillation:  S/p Maze + LAA clipping on 7/16.  He is off amiodarone.  -Continue Eliquis 5 mg BID.  4. CKD: Stage 3.  He required HD after CABG but renal function recovered. Follow BMET closely.  - Continue furosemide 20 mg daily  5. Liver transplant for HCV: Followed at [facility]. On Tacrolimus.   6. Erectile dysfunction: Ok to use low dose Viagra.   Richardine Service, PharmD, [place] PGY2 Cardiology Pharmacy Resident  Kerby Nora, PharmD, BCPS Heart Failure Clinic Pharmacist [phone]

## 2020-10-07 ENCOUNTER — Inpatient Hospital Stay (HOSPITAL_COMMUNITY): Admission: RE | Admit: 2020-10-07 | Payer: Medicare HMO | Source: Ambulatory Visit

## 2020-10-07 DIAGNOSIS — I129 Hypertensive chronic kidney disease with stage 1 through stage 4 chronic kidney disease, or unspecified chronic kidney disease: Secondary | ICD-10-CM | POA: Diagnosis not present

## 2020-10-07 DIAGNOSIS — N189 Chronic kidney disease, unspecified: Secondary | ICD-10-CM | POA: Diagnosis not present

## 2020-10-07 DIAGNOSIS — N1832 Chronic kidney disease, stage 3b: Secondary | ICD-10-CM | POA: Diagnosis not present

## 2020-10-07 DIAGNOSIS — D631 Anemia in chronic kidney disease: Secondary | ICD-10-CM | POA: Diagnosis not present

## 2020-10-07 DIAGNOSIS — R809 Proteinuria, unspecified: Secondary | ICD-10-CM | POA: Diagnosis not present

## 2020-10-07 DIAGNOSIS — I5022 Chronic systolic (congestive) heart failure: Secondary | ICD-10-CM | POA: Diagnosis not present

## 2020-10-11 NOTE — Progress Notes (Signed)
Cardiology Office Note  Date: 10/12/2020   ID: [place], Straus Sep 03, 1955, MRN [id number]  PCP:  Monico Blitz, MD  Cardiologist:  Carlyle Dolly, MD Electrophysiologist:  None   Chief Complaint: Cardiac follow-up systolic heart failure, CAD, status post CABG, atrial fibrillation, CKD stage III,  History of Present Illness: Joseph Greer is a 65 y.o. male with a history of liver transplant 2009 followed at [place], stage III CKD, hep C, HTN, type II DM. CAD, Acute systolic HF, 9/56/2130 LIMA to distal LAD, SVG to right posterior descending artery and right posterolateral artery as sequenced graft.,  Pedicle or RIMA to ramus intermedius branch of LCx.  SVG to second diagonal branch,  He was last seen by Dr. [name] on 09/28/2020.  He had a follow-up echocardiogram demonstrating EF of 30%, mid anterior and inferior severe hypokinesis, basal to mid anterolateral and inferolateral akinesis, mildly decreased RV systolic function.  He was returning for follow-up of CHF and CAD.  He was in normal sinus rhythm.  He denied any exertional dyspnea.  He was working out 4 times per week at [employer] 30 minutes each time. Denied any orthopnea or PND, chest pain.  Having some lightheadedness if standing too fast.  Aspirin was stopped and he was continuing Eliquis use.  He was continuing atorvastatin 80 mg daily.  He was doing well with NYHA class II symptoms.  He was not volume overloaded.  His creatinine was 2.09 at last check.  There was no room to increase medications secondary to occasional orthostatic hypotension.  He was continuing Coreg 6.25 mg p.o. twice daily.  Continuing Entresto 24/26 mg p.o. twice daily.  Lasix was reduced to 20 mg daily.  He was continuing [place] 10 mg daily.  He was continuing spironolactone daily.  He was willing to talk to EP again about ICD.  He was continuing Eliquis for atrial fibrillation.  He is here for 42-month follow-up.  He last saw Dr. [name] on 09/29/2019 and  had a repeat echocardiogram on 09/28/2020.  See  results below.  He is maintaining his weight around 178 on 175.  Today's weight is 171.  He continues working out at [employer] where he works.  He was having some issues with some dizziness and lightheadedness at visit with Dr. [name].  His Coreg was decreased to 3.125 mg p.o. twice daily.  States since decreasing the dosage his dizziness is better.  Blood pressure is on the low side today but denies any orthostatic symptoms i.e. lightheadedness, dizziness, near syncopal or syncopal episodes.  Denies any DOE or SOB.  Denies any anginal symptoms.  Denies any palpitations or arrhythmias.  He continues on Eliquis without any bleeding.  His spironolactone was changed from a.m. to p.m. dosing to help with orthostatic symptoms.  He is continuing Lasix 20 mg p.o. every other day.  He is continuing Entresto 24/26 mg p.o. twice daily.  He denies any claudication-like symptoms, bleeding issues, DVT or PE-like symptoms, or lower extremity edema.  He has an upcoming visit with Dr. Lovena Le to discuss possible ICD.   Past Medical History:  Diagnosis Date  . Anxiety   . CAD (coronary artery disease)    a. s/p CABG on 12/19/2019 with LIMA-LAD, seq SVG-RPDA-PL, RIMA-RI and SVG-D2 with Maze and LAA clipping  . CHF (congestive heart failure) (HCC)    a. EF 20-25% by echo in 12/2019  . CKD (chronic kidney disease), stage III ([place])   . Depression   .  Diabetes mellitus type 2, noninsulin dependent ([place])   . Hepatitis C   . Hypertension   . Psoriasis   . Status post liver transplant [facility])     Past Surgical History:  Procedure Laterality Date  . CLIPPING OF ATRIAL APPENDAGE N/A 12/19/2019   Procedure: CLIPPING OF ATRIAL APPENDAGE USING ATRICURE CLIP SIZE 50MM;  Surgeon: Wonda Olds, MD;  Location: [employer];  Service: Open Heart Surgery;  Laterality: N/A;  . COLONOSCOPY    . COLONOSCOPY WITH PROPOFOL N/A 06/01/2017   Procedure: COLONOSCOPY WITH PROPOFOL;  Surgeon:  Rogene Houston, MD;  [facility];  Service: Endoscopy;  Laterality: N/A;  7:30  . CORONARY ARTERY BYPASS GRAFT N/A 12/19/2019   Procedure: CORONARY ARTERY BYPASS GRAFTING (CABG) TIMES  X 5,   USING BILATERAL MAMMARY ARTERY AND RIGHT LEG GREATER SAPHENOUS VEIN HARVESTED ENDOSCOPICALLY;  Surgeon: Wonda Olds, MD;  Location: [place];  Service: Open Heart Surgery;  Laterality: N/A;  . HERNIA REPAIR Right   . IR FLUORO GUIDE CV LINE RIGHT  12/30/2019  . IR REMOVAL TUN CV CATH W/O FL  02/12/2020  . IR US GUIDE VASC ACCESS RIGHT  12/30/2019  . LEFT HEART CATH AND CORONARY ANGIOGRAPHY N/A 12/12/2019   Procedure: LEFT HEART CATH AND CORONARY ANGIOGRAPHY;  Surgeon: Jettie Booze, MD;  Location: [place] CV [facility];  Service: Cardiovascular;  Laterality: N/A;  . LIVER BIOPSY    . LIVER SURGERY    . MAZE N/A 12/19/2019   Procedure: MAZE;  Surgeon: Wonda Olds, MD;  Location: [place];  Service: Open Heart Surgery;  Laterality: N/A;  . POLYPECTOMY  06/01/2017   Procedure: POLYPECTOMY;  Surgeon: Rogene Houston, MD;  [facility];  Service: Endoscopy;;  polyp at sigmoid colon x2, ascending colon polyp, hepatic flexure polyp  . RIGHT HEART CATH N/A 12/17/2019   Procedure: RIGHT HEART CATH;  Surgeon: Larey Dresser, MD;  Location: [place] CV LAB;  Service: Cardiovascular;  Laterality: N/A;  . TEE WITHOUT CARDIOVERSION N/A 12/19/2019   Procedure: TRANSESOPHAGEAL ECHOCARDIOGRAM (TEE);  Surgeon: Wonda Olds, MD;  Location: [place];  Service: Open Heart Surgery;  Laterality: N/A;  . UPPER GASTROINTESTINAL ENDOSCOPY      Current Outpatient Medications  Medication Sig Dispense Refill  . Apremilast (OTEZLA) 30 MG TABS Take 1 tablet by mouth 2 (two) times daily.    Marland Kitchen atorvastatin (LIPITOR) 80 MG tablet Take 1 tablet (80 mg total) by mouth daily. 30 tablet 11  . carvedilol (COREG) 6.25 MG tablet Take 3.125 mg by mouth 2 (two) times daily.    . dapagliflozin propanediol  (FARXIGA) 10 MG TABS tablet Take 1 tablet (10 mg total) by mouth daily before breakfast. 30 tablet 3  . ELIQUIS 5 MG TABS tablet Take 1 tablet by mouth twice daily 60 tablet 6  . furosemide (LASIX) 20 MG tablet Take 1 tablet (20 mg total) by mouth every other day. 45 tablet 3  . LORazepam (ATIVAN) 1 MG tablet Take 1 mg by mouth 2 (two) times daily as needed (panic attacks).     . sacubitril-valsartan (ENTRESTO) 24-26 MG Take 1 tablet by mouth 2 (two) times daily. 60 tablet 3  . sildenafil (VIAGRA) 50 MG tablet Take 1 tablet (50 mg total) by mouth as needed for erectile dysfunction. 20 tablet 1  . spironolactone (ALDACTONE) 25 MG tablet Take 1 tablet (25 mg total) by mouth at bedtime. 90 tablet 3  . tacrolimus (PROGRAF)  1 MG capsule Take 2 tabs by mouth every morning & 1 tab every evening    . temazepam (RESTORIL) 30 MG capsule Take 30 mg by mouth at bedtime.    . triamcinolone cream (KENALOG) 0.1 % Apply 1 application topically 2 (two) times daily.    Marland Kitchen venlafaxine XR (EFFEXOR-XR) 75 MG 24 hr capsule Take 75 mg by mouth daily.     No current facility-administered medications for this visit.   Allergies:  Morphine and related   Social History: The patient  reports that he quit smoking about 13 years ago. His smoking use included cigarettes. He smoked 1.00 pack per day. He has never used smokeless tobacco. He reports that he does not drink alcohol and does not use drugs.   Family History: The patient's family history includes Diabetes in his mother; Heart disease in his father and son.   ROS:  Please see the history of present illness. Otherwise, complete review of systems is positive for none.  All other systems are reviewed and negative.   Physical Exam: VS:  BP 100/62   Pulse 85   Ht 6' (1.829 m)   Wt 171 lb (77.6 kg)   SpO2 98%   BMI 23.19 kg/m , BMI Body mass index is 23.19 kg/m.  Wt Readings from Last 3 Encounters:  10/12/20 171 lb (77.6 kg)  09/28/20 172 lb 3.2 oz (78.1 kg)   07/27/20 177 lb 12.8 oz (80.6 kg)    General: Patient appears comfortable at rest. Lungs: Clear to auscultation, nonlabored breathing at rest. Cardiac: Regular rate and rhythm, no S3 or significant systolic murmur, no pericardial rub. Extremities: No pitting edema, distal pulses 2+. Skin: Warm and dry.  Median sternotomy, chest tube insertion site, and right leg vein harvesting site are clean and dry without infection noted. Musculoskeletal: No kyphosis. Neuropsychiatric: Alert and oriented x3, affect grossly appropriate.  ECG:  January 06, 2020 EKG normal sinus rhythm rate of 68, nonspecific intraventricular conduction block, abnormal QRS/T angle consider primary T wave abnormality.  Recent Labwork: 12/11/2019: TSH 4.855 12/29/2019: Magnesium 2.4 04/01/2020: ALT 12; AST 16; B Natriuretic Peptide 1,413.0 09/28/2020: BUN 41; Creatinine, Ser 1.98; Hemoglobin 13.1; Platelets 259; Potassium 5.1; Sodium 135     Component Value Date/Time   CHOL 126 09/28/2020 1209   TRIG 48 09/28/2020 1209   HDL 50 09/28/2020 1209   CHOLHDL 2.5 09/28/2020 1209   VLDL 10 09/28/2020 1209   LDLCALC 66 09/28/2020 1209    Other Studies Reviewed Today:   Echocardiogram 09/28/2020  1. Left ventricular ejection fraction, by estimation, is 30%. The left ventricle has moderate to severely decreased function. The left ventricle demonstrates regional wall motion abnormalities with mid anterior severe hypokinesis, inferior severe hypokinesis, and basal to mid anterolateral and inferolateral akinesis. The left ventricular internal cavity size was mildly dilated. There is mild left ventricular hypertrophy. Left ventricular diastolic parameters are consistent with Grade II diastolic dysfunction (pseudonormalization). 2. Right ventricular systolic function is mildly reduced. The right ventricular size is normal. Tricuspid regurgitation signal is inadequate for assessing PA pressure. 3. Left atrial size was mildly  dilated. 4. The mitral valve is normal in structure. No evidence of mitral valve regurgitation. No evidence of mitral stenosis. Moderate mitral annular calcification. 5. The aortic valve is tricuspid. Aortic valve regurgitation is not visualized. No aortic stenosis is present. 6. Aortic dilatation noted. There is mild dilatation of the aortic root, measuring 38 mm. 7. The inferior vena cava is normal in size with  greater than 50% respiratory variability, suggesting right atrial pressure of 3 mmHg.   Echocardiogram 04/02/2020  1. Global hypokinesis with more severe inferior/inferoseptal hypokinesis. . Left ventricular ejection fraction, by estimation, is 20 to 25%. The left ventricle has severely decreased function. The left ventricle demonstrates regional wall motion abnormalities (see scoring diagram/findings for description). The left ventricular internal cavity size was moderately dilated. There is mild left ventricular hypertrophy. Left ventricular diastolic parameters are indeterminate. 2. Right ventricular systolic function mild to moderately reduced. The right ventricular size is normal. There is normal pulmonary artery systolic pressure. 3. Left atrial size was severely dilated. 4. The mitral valve is normal in structure. Moderate mitral valve regurgitation. No evidence of mitral stenosis. 5. The aortic valve is tricuspid. Aortic valve regurgitation is mild. No aortic stenosis is present. 6. Aortic dilatation noted. There is mild dilatation of the aortic root, measuring 40 mm.    Echocardiogram 12/23/2019 Limited  Limited study for tamponade. Very difficult study due to poor windows. No pericardial effusion present. LVEF severely reduced with global hypokinesis. There is severe hypokinesis of the entire lateral and inferior walls. EF reduced to 20-25% compared with 12/21/2019 which was likely 30-35%. 2. Left ventricular ejection fraction, by estimation, is 20 to 25%. The left  ventricle has severely decreased function. 3. The mitral valve is grossly normal. Trivial mitral valve regurgitation. No evidence of mitral stenosis. 4. The aortic valve is grossly normal.  Echocardiogram 12/21/2019  1. Left ventricular ejection fraction, by estimation, is 35 to 40%. The left ventricle has moderately decreased function. Left ventricular diastolic parameters are consistent with Grade II diastolic dysfunction Elevated left atrial pressure. 2. Right ventricular systolic function is normal. The right ventricular size is normal. 3. The mitral valve is abnormal. Mild mitral valve regurgitation. 4. The aortic valve is abnormal. Aortic valve regurgitation is not visualized. Mild aortic valve sclerosis is present, with no evidence of aortic valve stenosis. 5. The inferior vena cava is dilated in size with >50% respiratory variability, suggesting right atrial pressure of 8 mmHg  Echocardiogram 12/19/2019  Left Ventricle: has severely reduced systolic function, with an ejection fraction of 25%. The wall motion is abnormal with regional variation. Improved anterior wall motion following CPB, but LV still severely reduced function. . Aorta: The aorta appears unchanged from pre-bypass. . Aortic Valve: No evidence of AV insufficiency following separation from CPB. . Mitral Valve: Mitral valve regurgitation is trivial following separation from CPB. . Tricuspid Valve: The tricuspid valve appears unchanged from pre-bypass. . Interatrial Septum: s/p PFO closure with no evidence of intra-atrial shunting by Color flow doppler. . Pericardium: The pericardium appears unchanged from pre-bypass  12/19/2019 Maze procedure / Closure of PFO. / 5 vessel Bypass Procedure:        Biatrial maze procedure with radiofrequency ablation and cryo ablation  Closure of patent foramen ovale  Coronary Artery Bypass Grafting x 5             Left Internal Mammary Artery to Distal Left Anterior Descending  Coronary Artery; Saphenous Vein Graft to right posterior Descending Coronary Artery and right posterior lateral artery as a sequenced graft; pedicled right internal mammary artery to ramus intermedius Branch of Left Circumflex Coronary Artery; Sapheonous Vein Graft to second diagonal Branch Coronary Artery Endoscopic Vein Harvest from right thigh and Lower Leg Bilateral internal mammary artery harvesting    RHC 12/17/2019 Conclusion  1. Elevated PCWP >> RA pressure.  2. Pulmonary venous hypertension.  3. Preserved cardiac output.  Vascular US doppler 12/16/2019 Right Carotid: Velocities in the right ICA are consistent with a 1-39% stenosis. Left Carotid: Velocities in the left ICA are consistent with a 1-39% stenosis. Vertebrals: Bilateral vertebral arteries demonstrate antegrade flow. Right ABI: Resting right ankle-brachial index is within normal range. No evidence of significant right lower extremity arterial disease. Left ABI: Resting left ankle-brachial index is within normal range. No evidence of significant left lower extremity arterial disease. Right Upper Extremity: Doppler waveforms remain within normal limits with right radial compression. Doppler waveforms remain within normal limits with right ulnar compression. Left Upper Extremity: Doppler waveform obliterate with left radial compression. Doppler waveforms remain within normal limits with left ulnar compression. Electronically signed by Harold Barban MD on 12/16/2019 at 4:20:38 PM. Right Rt Pressure (mmHg) Index Waveform Comment Brachial triphasic restricted extremity PTA 134 1.13 triphasic DP 137 1.15 triphasic Left Lt Pressure (mmHg) Index Waveform Comment Brachial 119 triphasic PTA 131 1.10 triphasic DP 140 1.18 triphasic ABI/TBI Today's ABI/TBI Previous ABI/TBI Right 1.15 Left 1.18 Site Pressure Index Doppler Comments Brachial triphasic restricted extremity Radial triphasic Ulnar triphasic Site Pressure  Index Doppler Comments Brachial 119 triphasic Radial triphasic Ulnar triphasic   LHC 12/12/2019 LEFT HEART CATH AND CORONARY ANGIOGRAPHY  Conclusion    Dist RCA lesion is 100% stenosed. Faint left to right and right to right collaterals.  Ramus lesion is 95% stenosed.  1st Diag lesion is 75% stenosed.  Prox Cx lesion is 99% stenosed. THis is the culprit lesion. TIMI 2 flow.  Ost LAD to Prox LAD lesion is 60% stenosed. Complex lesion with shelf of plaque.  Mid LAD lesion is 80% stenosed.  LV end diastolic pressure is moderately elevated.  There is no aortic valve stenosis.   Plan for cardiac surgery consult.  Diagnostic Dominance: Right     Assessment and Plan:   1. Status post coronary artery bypass grafts x 5 / NSTEMI CABG 7/16 with LIMA-LAD, RIMA-ramus, seq SVG-PD/PL, SVG-D.   Median sternotomy site, chest tube insertion site and right leg vein harvesting site clean and dry at last visit.  Currently denies any anginal symptoms.  Continue Coreg 3.125 mg p.o. daily.   2. Chronic systolic heart failure ([place])  Recent echo on 04/02/2020:  EF continues at 20 to 25%.  Continue carvedilol 3.125 mg p.o. twice daily.  Continue Lasix 40 mg p.o. daily.  Continue Entresto 24/26 mg p.o. twice daily.  Continue spironolactone 12.5 mg p.o. daily.  Continue to restrict fluids to 1.5 to 2 L/day.  Restrict sodium intake.  Continue to weigh daily.  Report weight gain of 3 pounds in 24 hours or 5 pounds in 1 week.    3. CAD in native artery Severe 3VD on cath 7/21 =>60-70% ostial LAD, 80% mLAD, 95% ramus, 99% proximal LCx with TIMI-2 flow down the rest of the LCx, occluded distal RCA. Suspected out of hospital inferolateral MI.  Status post 5 vessel bypass.  Denies any progressive anginal or exertional symptoms.  Continue aspirin 81 mg daily.  Continue atorvastatin 80 mg by mouth daily.  Continue carvedilol 3.125 mg p.o. twice daily.   4.Atrial fibrillation S/P MAZE/ LAA clipping.  HR  and rhythm are regular at 90. Continue Eliquis 5 mg p.o. twice daily.  Continue carvedilol 3.125 mg p.o. twice daily  5.  CKD stage III  Recent creatinine and GFR recent creatinine 1.62 and GFR 47 on 06/24/2020.  This has improved since October 2021 when creatinine was 2.02 and GFR was 36.  6.  Cardiomyopathy Recent follow-up echocardiogram on 09/08/2020 demonstrated LVEF by estimation 30%.  Moderate to severely decreased function.  Positive for WMA's.  With mid anterior severe hypokinesis, inferior severe hypokinesis and basal to mid anterolateral and inferolateral akinesis.  Mild LVH G2 DD, LA mildly dilated, mild dilatation of the aortic root measuring 38 mm.  He has an upcoming appointment with Dr. Lovena Le EP for evaluation for possible ICD  Medication Adjustments/Labs and Tests Ordered: Current medicines are reviewed at length with the patient today.  Concerns regarding medicines are outlined above.   Disposition: Follow-up with Dr. Harl Bowie or APP 6 months  Signed, Levell July, NP 10/12/2020 1:31 PM    [facility] at [place], [place], [place] [address] Phone: [phone]; Fax: [phone]

## 2020-10-11 NOTE — Progress Notes (Incomplete)
***In Progress*** Referring Physician: PCP: Monico Blitz, MD HF Cardiology: Dr. [name]  HPI:  65 y.o. male w/ h/o HCV s/p liver transplant in 2009(followed at [facility]), Stage III CKD, HTN and T2DM. He was admitted 12/2019 w/ NSTEMI and Afib w/ RVR, found to have severe 3VD and biventricular heart failure, LVEF 20-25%, RV moderately reduced. Underwent CABG + MAZE + LA appendage clipping 12/19/19. Required milrinone post operatively. Post-op course complicated by ATN/renal failure. Required CVVHD but remained anuric. Tunneled HD cath placed and transitioned to iHD, MWF. He was able to transition off HD as an outpatient.   He had repeat echo done in 10/21 with EF 20-25%, moderate LV dilation, mild-moderately decreased RV systolic function, moderate MR.   Echo on 09/28/20 showed EF 30%, mid anterior and inferior severe hypokinesis, basal to mid anterolateral and inferolateral akinesis, mildly decreased RV systolic function.   He recently returned for followup of CHF and CAD on 09/28/20 with Dr. [name]. He was in NSR. No exertional dyspnea, he was working out four times/week at [employer], did 30 minutes of cardio. No orthopnea/PND. No chest pain. He noted lightheadedness if he stands too fast.   Today he returns to HF clinic for pharmacist medication titration. At last visit with MD, furosemide was decreased to 20 mg daily and spironolactone was moved to QHS. BP was too low for further medication titration.  104/68, 77, 172 lbs - DM wanted a bmet in 1 week (now) but only decr'd furos - If BP ok and no orthostasis > could incr coreg 9.375 mg BID - If BP low > no changes - Watch orthostasis and volume to see if furos 20 needs to be PRN F/u 7/26 w/ DM  Overall feeling ***. Dizziness, lightheadedness, fatigue:  Chest pain or palpitations:  How is your breathing?: *** SOB: Able to complete all ADLs. Activity level ***  Weight at home pounds. Takes furosemide/torsemide/bumex *** mg *** daily.   LEE PND/Orthopnea  Appetite *** Low-salt diet:   Physical Exam Cost/affordability of meds   HF Medications: Carvedilol 6.25 mg BID Entresto 24/26 mg BID Spironolactone 25 mg daily Farxiga 10 mg daily Furosemide 20 mg daily  Has the patient been experiencing any side effects to the medications prescribed?  {YES NO:22349}  Does the patient have any problems obtaining medications due to transportation or finances?   yes - Has [employer]. Maryan Puls from [employer] and [place] from [place].  Understanding of regimen: {excellent/good/fair/poor:19665} Understanding of indications: {excellent/good/fair/poor:19665} Potential of compliance: {excellent/good/fair/poor:19665} Patient understands to avoid NSAIDs. Patient understands to avoid decongestants.    Pertinent Lab Values: . Serum creatinine ***, BUN ***, Potassium ***, Sodium ***, BNP ***, Magnesium ***, Digoxin ***   Vital Signs: . Weight: *** (last clinic weight: 172 lbs) . Blood pressure: ***  . Heart rate: ***   Assessment/Plan: 1. CAD: NSTEMI in 7/21. Suspect out of hospital inferolateral MI. Cardiac MRI showed that the inferolateral wall was likely not viable, but the remainder of the LV myocardium was likely viable. He had CABG 12/19/19 with LIMA-LAD, RIMA-ramus, seq SVG-PD/PL, SVG-D.  No chest pain.  - Off ASA given Eliquis use.  - Continue atorvastatin 80 mg qhs. Good lipids in 9/21.   2. Chronic Systolic CHF: Ischemic cardiomyopathy.  Echo 7/21 with EF 20-25%, moderately decreased RV systolic function. Cardiac MRI with LV EF 26%, RV EF 38%, near full thickness scar in inferolateral wall suggesting lack of viability in the LCx territory. Now post-CABG.  Echo in 10/21  showed EF 20-25%, moderate LV dilation, mild-moderately decreased RV systolic function, moderate MR.  Echo on 09/28/20 showed EF 30%, mid anterior and inferior severe hypokinesis, basal to mid anterolateral and inferolateral akinesis,  mildly decreased RV systolic function.  - Doing well currently, NYHA class II symptoms.  He is not volume overloaded. - Continue furosemide 20 mg daily - Continue carvedilol 6.25 mg BID - Continue Entresto 24/26 mg BID - Continue spironolactone 25 mg QHS - Continue Farxiga 10 mg daily - He is willing to talk with EP again about ICD, he is now leaning towards getting one. Scheduled to see EP on 10/21/20. Not CRT candidate with narrow QRS.   3. Atrial fibrillation:  S/p Maze + LAA clipping on 7/16.  He is off amiodarone.  -Continue Eliquis 5 mg BID.  4. CKD: Stage 3.  He required HD after CABG but renal function recovered. Follow BMET closely.  - Continue furosemide 20 mg daily  5. Liver transplant for HCV: Followed at [facility]. On Tacrolimus.   6. Erectile dysfunction: Ok to use low dose Viagra.   Richardine Service, PharmD, [place] PGY2 Cardiology Pharmacy Resident  Kerby Nora, PharmD, BCPS Heart Failure Clinic Pharmacist [phone]

## 2020-10-12 ENCOUNTER — Encounter: Payer: Self-pay | Admitting: Family Medicine

## 2020-10-12 ENCOUNTER — Ambulatory Visit: Payer: Medicare HMO | Admitting: Family Medicine

## 2020-10-12 VITALS — BP 100/62 | HR 85 | Ht 72.0 in | Wt 171.0 lb

## 2020-10-12 DIAGNOSIS — I4891 Unspecified atrial fibrillation: Secondary | ICD-10-CM | POA: Diagnosis not present

## 2020-10-12 DIAGNOSIS — N1831 Chronic kidney disease, stage 3a: Secondary | ICD-10-CM

## 2020-10-12 DIAGNOSIS — I5022 Chronic systolic (congestive) heart failure: Secondary | ICD-10-CM

## 2020-10-12 DIAGNOSIS — I2510 Atherosclerotic heart disease of native coronary artery without angina pectoris: Secondary | ICD-10-CM

## 2020-10-12 MED ORDER — DAPAGLIFLOZIN PROPANEDIOL 10 MG PO TABS: 10.0000 mg | ORAL_TABLET | Freq: Every day | ORAL | 0 refills | Status: DC

## 2020-10-12 MED ORDER — ENTRESTO 24-26 MG PO TABS: 1.0000 | ORAL_TABLET | Freq: Two times a day (BID) | ORAL | 0 refills | Status: DC

## 2020-10-12 NOTE — Patient Instructions (Addendum)
Medication Instructions:   Samples provided for the Farxiga & Entresto.    Message will be sent to heart failure clinic in regards to re-order for patient assistance.   Continue all other current medications.  Labwork: none  Testing/Procedures: none  Follow-Up: 6 months   Any Other Special Instructions Will Be Listed Below (If Applicable).  If you need a refill on your cardiac medications before your next appointment, please call your pharmacy.

## 2020-10-14 DIAGNOSIS — L570 Actinic keratosis: Secondary | ICD-10-CM | POA: Diagnosis not present

## 2020-10-14 DIAGNOSIS — Z85828 Personal history of other malignant neoplasm of skin: Secondary | ICD-10-CM | POA: Diagnosis not present

## 2020-10-14 DIAGNOSIS — D849 Immunodeficiency, unspecified: Secondary | ICD-10-CM | POA: Diagnosis not present

## 2020-10-14 DIAGNOSIS — D485 Neoplasm of uncertain behavior of skin: Secondary | ICD-10-CM | POA: Diagnosis not present

## 2020-10-14 DIAGNOSIS — L409 Psoriasis, unspecified: Secondary | ICD-10-CM | POA: Diagnosis not present

## 2020-10-14 DIAGNOSIS — L578 Other skin changes due to chronic exposure to nonionizing radiation: Secondary | ICD-10-CM | POA: Diagnosis not present

## 2020-10-14 DIAGNOSIS — L814 Other melanin hyperpigmentation: Secondary | ICD-10-CM | POA: Diagnosis not present

## 2020-10-14 DIAGNOSIS — C44519 Basal cell carcinoma of skin of other part of trunk: Secondary | ICD-10-CM | POA: Diagnosis not present

## 2020-10-14 DIAGNOSIS — L280 Lichen simplex chronicus: Secondary | ICD-10-CM | POA: Diagnosis not present

## 2020-10-14 DIAGNOSIS — D2260 Melanocytic nevi of unspecified upper limb, including shoulder: Secondary | ICD-10-CM | POA: Diagnosis not present

## 2020-10-15 DIAGNOSIS — Z944 Liver transplant status: Secondary | ICD-10-CM | POA: Diagnosis not present

## 2020-10-20 NOTE — Progress Notes (Addendum)
Referring Physician: PCP: Monico Blitz, MD HF Cardiology: Dr. [name]  HPI:  65 y.o. male w/ h/o HCV s/p liver transplant in 2009(followed at [facility]), Stage III CKD, HTN and T2DM. He was admitted 12/2019 w/ NSTEMI and Afib w/ RVR, found to have severe 3VD and biventricular heart failure, LVEF 20-25%, RV moderately reduced. Underwent CABG + MAZE + LA appendage clipping 12/19/19. Required milrinone post operatively. Post-op course complicated by ATN/renal failure. Required CVVHD but remained anuric. Tunneled HD cath placed and transitioned to iHD, MWF. He was able to transition off HD as an outpatient.   He had repeat echo done in 10/21 with EF 20-25%, moderate LV dilation, mild-moderately decreased RV systolic function, moderate MR.   Echo on 09/28/20 showed EF 30%, mid anterior and inferior severe hypokinesis, basal to mid anterolateral and inferolateral akinesis, mildly decreased RV systolic function.   He recently returned for followup of CHF and CAD on 09/28/20 with Dr. [name]. He was in NSR. No exertional dyspnea, he was working out four times/week at [employer], did 30 minutes of cardio. No orthopnea/PND. No chest pain. He noted lightheadedness if he stands too fast.   Today he returns to HF clinic for pharmacist medication titration. At last visit with MD, furosemide was decreased to 20 mg every other day and spironolactone was moved to QHS. BP was too low for further medication titration. He reports he is overall feeling well and states his lightheadedness and dizziness has greatly improved since reducing his furosemide to every other day. He denies chest pain or palpitations. His breathing is good and denies any shortness of breath. He is able to complete all ADLs. Activity level is great - he does 30 mins of cardio four times weekly and strength training four times weekly. His weight at home usually is around 170 lbs although he states it has been coming down slowly. His weight this morning was  164 lbs and was 167 lbs in clinic. He takes furosemide 20 mg every other day. ReDS was normal at 30% today. No LEE, PND, or orthopnea. Lungs are clear. His appetite is good. He eats an egg sandwich every morning, broiled chicken/fish for dinner, protein shakes before workouts. He is going to start drinking meal replacement shakes between lunch and dinner to help increase his calories for the day to help promote muscle and weight gain.  HF Medications: Carvedilol 3.125 mg BID Entresto 24/26 mg BID Spironolactone 25 mg QHS Farxiga 10 mg daily Furosemide 20 mg every other  Has the patient been experiencing any side effects to the medications prescribed?  no  Does the patient have any problems obtaining medications due to transportation or finances?   yes - Has [employer]. Maryan Puls from [employer] and [place] from [place].  Understanding of regimen: good Understanding of indications: good Potential of compliance: good Patient understands to avoid NSAIDs. Patient understands to avoid decongestants.    Pertinent Lab Values: . From 10/15/20: Serum creatinine 1.87, BUN 21, Potassium 5.1 (stable from prior), Sodium 137  Vital Signs: . Weight: 167 (last clinic weight: 171 lbs) . Blood pressure: 106/62  . Heart rate: 79   Assessment/Plan: 1. CAD: NSTEMI in 7/21. Suspect out of hospital inferolateral MI. Cardiac MRI showed that the inferolateral wall was likely not viable, but the remainder of the LV myocardium was likely viable. He had CABG 12/19/19 with LIMA-LAD, RIMA-ramus, seq SVG-PD/PL, SVG-D.  No chest pain.  - Off ASA given Eliquis use.  - Continue atorvastatin 80 mg  qhs. Good lipids in 9/21.   2. Chronic Systolic CHF: Ischemic cardiomyopathy.  Echo 7/21 with EF 20-25%, moderately decreased RV systolic function. Cardiac MRI with LV EF 26%, RV EF 38%, near full thickness scar in inferolateral wall suggesting lack of viability in the LCx territory. Now post-CABG.  Echo  in 10/21 showed EF 20-25%, moderate LV dilation, mild-moderately decreased RV systolic function, moderate MR.  Echo on 09/28/20 showed EF 30%, mid anterior and inferior severe hypokinesis, basal to mid anterolateral and inferolateral akinesis, mildly decreased RV systolic function.  - Doing well currently, NYHA class II symptoms.  He is not volume overloaded. ReDS 30% today. - Continue furosemide 20 mg every other day - Increase carvedilol to 6.25 mg BID - Continue Entresto 24/26 mg BID - Continue spironolactone 25 mg QHS. Potassium 5.1 on last two checks. Educated to not introduce high-potassium containing foods in his diet.  - Continue Farxiga 10 mg daily - Not CRT candidate with narrow QRS - Samples of Entresto and [place] provided today. He has called to request shipment from [employer] and [employer] but will run out of his current supply on Sunday.   3. Atrial fibrillation:  S/p Maze + LAA clipping on 7/16.  He is off amiodarone.  -Continue Eliquis 5 mg BID. - Sample of Eliquis provided in clinic today.  4. CKD: Stage 3.  He required HD after CABG but renal function recovered. Follow BMET closely.  - Continue furosemide 20 mg every other day  5. Liver transplant for HCV: Followed at [facility]. On Tacrolimus.   6. Erectile dysfunction: Ok to use low dose Viagra.   Follow up with Dr. [name] on 12/28/20  Kerby Nora, PharmD, [facility] Pharmacist [phone]

## 2020-10-21 ENCOUNTER — Encounter (HOSPITAL_COMMUNITY): Payer: Self-pay

## 2020-10-21 ENCOUNTER — Ambulatory Visit (HOSPITAL_COMMUNITY)
Admission: RE | Admit: 2020-10-21 | Discharge: 2020-10-21 | Disposition: A | Discharge status: Home / Self Care | Payer: Medicare HMO | Source: Ambulatory Visit | Attending: Internal Medicine | Admitting: Internal Medicine

## 2020-10-21 ENCOUNTER — Institutional Professional Consult (permissible substitution): Payer: Medicare HMO | Admitting: Internal Medicine

## 2020-10-21 ENCOUNTER — Other Ambulatory Visit: Payer: Self-pay

## 2020-10-21 VITALS — BP 106/62 | HR 79 | Wt 167.0 lb

## 2020-10-21 DIAGNOSIS — I5022 Chronic systolic (congestive) heart failure: Secondary | ICD-10-CM

## 2020-10-21 DIAGNOSIS — I509 Heart failure, unspecified: Secondary | ICD-10-CM | POA: Diagnosis not present

## 2020-10-21 MED ORDER — CARVEDILOL 6.25 MG PO TABS: 6.2500 mg | ORAL_TABLET | Freq: Two times a day (BID) | ORAL | 3 refills | Status: DC

## 2020-10-21 NOTE — Patient Instructions (Signed)
It was a pleasure seeing you today!  MEDICATIONS: -We are changing your medications today -Increase carvedilol to 1 tablet (6.25 mg) by mouth twice daily -Call if you have questions about your medications.  NEXT APPOINTMENT: Return to clinic in July with Dr. [name].  In general, to take care of your heart failure: -Limit your fluid intake to 2 Liters (half-gallon) per day.   -Limit your salt intake to ideally 2-3 grams (2000-3000 mg) per day. -Weigh yourself daily and record, and bring that "weight diary" to your next appointment.  (Weight gain of 2-3 pounds in 1 day typically means fluid weight.) -The medications for your heart are to help your heart and help you live longer.   -Please contact us before stopping any of your heart medications.  Call the clinic at [phone] with questions or to reschedule future appointments.

## 2020-11-02 ENCOUNTER — Encounter (HOSPITAL_COMMUNITY): Payer: Self-pay

## 2020-11-04 DIAGNOSIS — Z299 Encounter for prophylactic measures, unspecified: Secondary | ICD-10-CM | POA: Diagnosis not present

## 2020-11-04 DIAGNOSIS — I482 Chronic atrial fibrillation, unspecified: Secondary | ICD-10-CM | POA: Diagnosis not present

## 2020-11-04 DIAGNOSIS — Z6827 Body mass index (BMI) 27.0-27.9, adult: Secondary | ICD-10-CM | POA: Diagnosis not present

## 2020-11-04 DIAGNOSIS — I509 Heart failure, unspecified: Secondary | ICD-10-CM | POA: Diagnosis not present

## 2020-11-04 DIAGNOSIS — I25738 Atherosclerosis of nonautologous biological coronary artery bypass graft(s) with other forms of angina pectoris: Secondary | ICD-10-CM | POA: Diagnosis not present

## 2020-11-04 DIAGNOSIS — I10 Essential (primary) hypertension: Secondary | ICD-10-CM | POA: Diagnosis not present

## 2020-11-04 DIAGNOSIS — G4700 Insomnia, unspecified: Secondary | ICD-10-CM | POA: Diagnosis not present

## 2020-11-23 DIAGNOSIS — Z85828 Personal history of other malignant neoplasm of skin: Secondary | ICD-10-CM | POA: Diagnosis not present

## 2020-11-23 DIAGNOSIS — L409 Psoriasis, unspecified: Secondary | ICD-10-CM | POA: Diagnosis not present

## 2020-11-23 DIAGNOSIS — L570 Actinic keratosis: Secondary | ICD-10-CM | POA: Diagnosis not present

## 2020-12-17 DIAGNOSIS — B998 Other infectious disease: Secondary | ICD-10-CM | POA: Diagnosis not present

## 2020-12-17 DIAGNOSIS — Z944 Liver transplant status: Secondary | ICD-10-CM | POA: Diagnosis not present

## 2020-12-17 DIAGNOSIS — D849 Immunodeficiency, unspecified: Secondary | ICD-10-CM | POA: Diagnosis not present

## 2020-12-28 ENCOUNTER — Encounter (HOSPITAL_COMMUNITY): Payer: Medicare HMO | Admitting: Cardiology

## 2021-01-04 DIAGNOSIS — D631 Anemia in chronic kidney disease: Secondary | ICD-10-CM | POA: Diagnosis not present

## 2021-01-04 DIAGNOSIS — I129 Hypertensive chronic kidney disease with stage 1 through stage 4 chronic kidney disease, or unspecified chronic kidney disease: Secondary | ICD-10-CM | POA: Diagnosis not present

## 2021-01-04 DIAGNOSIS — N1832 Chronic kidney disease, stage 3b: Secondary | ICD-10-CM | POA: Diagnosis not present

## 2021-01-04 DIAGNOSIS — R809 Proteinuria, unspecified: Secondary | ICD-10-CM | POA: Diagnosis not present

## 2021-01-04 DIAGNOSIS — N189 Chronic kidney disease, unspecified: Secondary | ICD-10-CM | POA: Diagnosis not present

## 2021-01-05 DIAGNOSIS — Z299 Encounter for prophylactic measures, unspecified: Secondary | ICD-10-CM | POA: Diagnosis not present

## 2021-01-05 DIAGNOSIS — L4050 Arthropathic psoriasis, unspecified: Secondary | ICD-10-CM | POA: Diagnosis not present

## 2021-01-05 DIAGNOSIS — G4700 Insomnia, unspecified: Secondary | ICD-10-CM | POA: Diagnosis not present

## 2021-01-05 DIAGNOSIS — I10 Essential (primary) hypertension: Secondary | ICD-10-CM | POA: Diagnosis not present

## 2021-01-05 DIAGNOSIS — R69 Illness, unspecified: Secondary | ICD-10-CM | POA: Diagnosis not present

## 2021-01-05 DIAGNOSIS — I509 Heart failure, unspecified: Secondary | ICD-10-CM | POA: Diagnosis not present

## 2021-01-07 DIAGNOSIS — N1832 Chronic kidney disease, stage 3b: Secondary | ICD-10-CM | POA: Diagnosis not present

## 2021-01-07 DIAGNOSIS — D631 Anemia in chronic kidney disease: Secondary | ICD-10-CM | POA: Diagnosis not present

## 2021-01-07 DIAGNOSIS — E211 Secondary hyperparathyroidism, not elsewhere classified: Secondary | ICD-10-CM | POA: Diagnosis not present

## 2021-01-07 DIAGNOSIS — N189 Chronic kidney disease, unspecified: Secondary | ICD-10-CM | POA: Diagnosis not present

## 2021-01-07 DIAGNOSIS — R809 Proteinuria, unspecified: Secondary | ICD-10-CM | POA: Diagnosis not present

## 2021-01-07 DIAGNOSIS — I5022 Chronic systolic (congestive) heart failure: Secondary | ICD-10-CM | POA: Diagnosis not present

## 2021-01-07 DIAGNOSIS — E875 Hyperkalemia: Secondary | ICD-10-CM | POA: Diagnosis not present

## 2021-01-07 DIAGNOSIS — I952 Hypotension due to drugs: Secondary | ICD-10-CM | POA: Diagnosis not present

## 2021-01-10 ENCOUNTER — Other Ambulatory Visit (HOSPITAL_COMMUNITY): Payer: Self-pay | Admitting: Cardiology

## 2021-01-15 ENCOUNTER — Encounter (HOSPITAL_COMMUNITY): Payer: Self-pay

## 2021-01-17 ENCOUNTER — Other Ambulatory Visit (HOSPITAL_COMMUNITY): Payer: Self-pay

## 2021-01-17 NOTE — Telephone Encounter (Signed)
Do we have samples we can give him? Im going to talk to him about patient assistance but this is one of the meds that has the out of pocket to meet, I dont know if he will qualify right off hand.

## 2021-01-18 ENCOUNTER — Telehealth: Payer: Self-pay | Admitting: Cardiology

## 2021-01-18 NOTE — Telephone Encounter (Signed)
Error

## 2021-01-18 NOTE — Telephone Encounter (Signed)
Patient came into the office needing samples of Eliquis.   Per Patient Message thread in his chart he has a month supply waiting for him at [facility]   I informed the patient that they had his samples, pt stated he is not able to make it out there to pick them up till tomorrow or Thursday.   He is out and wanting to know if we could give him a bottle till he's able to pick them up in [place]?  Please call [phone]

## 2021-01-27 ENCOUNTER — Telehealth (HOSPITAL_COMMUNITY): Payer: Self-pay | Admitting: Pharmacy Technician

## 2021-01-27 ENCOUNTER — Other Ambulatory Visit (HOSPITAL_COMMUNITY): Payer: Self-pay

## 2021-01-27 NOTE — Telephone Encounter (Signed)
Advanced Heart Failure Patient Advocate Encounter  I received a request from [employer] for an updated copy of the patient's insurance cards. He was approved at the beginning of the year, not sure why they would request this information now.  Sent in insurance cards via fax.  Charlann Boxer, CPhT

## 2021-02-02 DIAGNOSIS — G4700 Insomnia, unspecified: Secondary | ICD-10-CM | POA: Diagnosis not present

## 2021-02-02 DIAGNOSIS — K7460 Unspecified cirrhosis of liver: Secondary | ICD-10-CM | POA: Diagnosis not present

## 2021-02-02 DIAGNOSIS — I482 Chronic atrial fibrillation, unspecified: Secondary | ICD-10-CM | POA: Diagnosis not present

## 2021-02-02 DIAGNOSIS — Z299 Encounter for prophylactic measures, unspecified: Secondary | ICD-10-CM | POA: Diagnosis not present

## 2021-02-02 DIAGNOSIS — I509 Heart failure, unspecified: Secondary | ICD-10-CM | POA: Diagnosis not present

## 2021-02-02 DIAGNOSIS — Z6835 Body mass index (BMI) 35.0-35.9, adult: Secondary | ICD-10-CM | POA: Diagnosis not present

## 2021-02-22 DIAGNOSIS — M9901 Segmental and somatic dysfunction of cervical region: Secondary | ICD-10-CM | POA: Diagnosis not present

## 2021-02-22 DIAGNOSIS — M47812 Spondylosis without myelopathy or radiculopathy, cervical region: Secondary | ICD-10-CM | POA: Diagnosis not present

## 2021-02-22 DIAGNOSIS — M19011 Primary osteoarthritis, right shoulder: Secondary | ICD-10-CM | POA: Diagnosis not present

## 2021-02-28 DIAGNOSIS — M47812 Spondylosis without myelopathy or radiculopathy, cervical region: Secondary | ICD-10-CM | POA: Diagnosis not present

## 2021-02-28 DIAGNOSIS — M19011 Primary osteoarthritis, right shoulder: Secondary | ICD-10-CM | POA: Diagnosis not present

## 2021-02-28 DIAGNOSIS — M9901 Segmental and somatic dysfunction of cervical region: Secondary | ICD-10-CM | POA: Diagnosis not present

## 2021-03-04 ENCOUNTER — Encounter (HOSPITAL_COMMUNITY): Payer: Medicare HMO | Admitting: Cardiology

## 2021-03-11 DIAGNOSIS — M9901 Segmental and somatic dysfunction of cervical region: Secondary | ICD-10-CM | POA: Diagnosis not present

## 2021-03-11 DIAGNOSIS — M47812 Spondylosis without myelopathy or radiculopathy, cervical region: Secondary | ICD-10-CM | POA: Diagnosis not present

## 2021-03-11 DIAGNOSIS — M19011 Primary osteoarthritis, right shoulder: Secondary | ICD-10-CM | POA: Diagnosis not present

## 2021-03-23 ENCOUNTER — Other Ambulatory Visit: Payer: Self-pay

## 2021-03-23 ENCOUNTER — Ambulatory Visit (HOSPITAL_COMMUNITY)
Admission: RE | Admit: 2021-03-23 | Discharge: 2021-03-23 | Disposition: A | Discharge status: Home / Self Care | Payer: Medicare HMO | Source: Ambulatory Visit | Attending: Cardiology | Admitting: Cardiology

## 2021-03-23 ENCOUNTER — Encounter (HOSPITAL_COMMUNITY): Payer: Self-pay | Admitting: Cardiology

## 2021-03-23 VITALS — BP 120/60 | HR 69 | Wt 156.6 lb

## 2021-03-23 DIAGNOSIS — I252 Old myocardial infarction: Secondary | ICD-10-CM | POA: Insufficient documentation

## 2021-03-23 DIAGNOSIS — I2510 Atherosclerotic heart disease of native coronary artery without angina pectoris: Secondary | ICD-10-CM | POA: Insufficient documentation

## 2021-03-23 DIAGNOSIS — I480 Paroxysmal atrial fibrillation: Secondary | ICD-10-CM | POA: Diagnosis not present

## 2021-03-23 DIAGNOSIS — N183 Chronic kidney disease, stage 3 unspecified: Secondary | ICD-10-CM | POA: Diagnosis not present

## 2021-03-23 DIAGNOSIS — Z833 Family history of diabetes mellitus: Secondary | ICD-10-CM | POA: Diagnosis not present

## 2021-03-23 DIAGNOSIS — I130 Hypertensive heart and chronic kidney disease with heart failure and stage 1 through stage 4 chronic kidney disease, or unspecified chronic kidney disease: Secondary | ICD-10-CM | POA: Insufficient documentation

## 2021-03-23 DIAGNOSIS — Z7901 Long term (current) use of anticoagulants: Secondary | ICD-10-CM | POA: Insufficient documentation

## 2021-03-23 DIAGNOSIS — Z79621 Long term (current) use of calcineurin inhibitor: Secondary | ICD-10-CM | POA: Diagnosis not present

## 2021-03-23 DIAGNOSIS — Z7984 Long term (current) use of oral hypoglycemic drugs: Secondary | ICD-10-CM | POA: Diagnosis not present

## 2021-03-23 DIAGNOSIS — Z944 Liver transplant status: Secondary | ICD-10-CM | POA: Diagnosis not present

## 2021-03-23 DIAGNOSIS — Z8249 Family history of ischemic heart disease and other diseases of the circulatory system: Secondary | ICD-10-CM | POA: Insufficient documentation

## 2021-03-23 DIAGNOSIS — Z951 Presence of aortocoronary bypass graft: Secondary | ICD-10-CM | POA: Insufficient documentation

## 2021-03-23 DIAGNOSIS — E1122 Type 2 diabetes mellitus with diabetic chronic kidney disease: Secondary | ICD-10-CM | POA: Insufficient documentation

## 2021-03-23 DIAGNOSIS — I5022 Chronic systolic (congestive) heart failure: Secondary | ICD-10-CM | POA: Insufficient documentation

## 2021-03-23 DIAGNOSIS — Z885 Allergy status to narcotic agent status: Secondary | ICD-10-CM | POA: Insufficient documentation

## 2021-03-23 LAB — BASIC METABOLIC PANEL
Anion gap: 4 — ABNORMAL LOW (ref 5–15)
BUN: 40 mg/dL — ABNORMAL HIGH (ref 8–23)
CO2: 24 mmol/L (ref 22–32)
Calcium: 8.6 mg/dL — ABNORMAL LOW (ref 8.9–10.3)
Chloride: 110 mmol/L (ref 98–111)
Creatinine, Ser: 1.94 mg/dL — ABNORMAL HIGH (ref 0.61–1.24)
GFR, Estimated: 38 mL/min — ABNORMAL LOW (ref >60)
Glucose, Bld: 142 mg/dL — ABNORMAL HIGH (ref 70–99)
Potassium: 5.3 mmol/L — ABNORMAL HIGH (ref 3.5–5.1)
Sodium: 138 mmol/L (ref 135–145)

## 2021-03-23 MED ORDER — CARVEDILOL 12.5 MG PO TABS: 12.5000 mg | ORAL_TABLET | Freq: Two times a day (BID) | ORAL | 5 refills | Status: DC

## 2021-03-23 NOTE — Patient Instructions (Signed)
INCREASE Coreg to 12.5mg  (1 tab) twice a day  Someone from pharmacy will call you to discuss Eliquis assistance.  You have been given samples today.  Labs today We will only contact you if something comes back abnormal or we need to make some changes. Otherwise no news is good news!  Your physician recommends that you schedule a follow-up appointment in: 3 months with Dr [name]  Please call office at [phone] option [phone] if you have any questions or concerns.    At the [facility], you and your health needs are our priority. As part of our continuing mission to provide you with exceptional heart care, we have created designated Provider Care Teams. These Care Teams include your primary Cardiologist (physician) and Advanced Practice Providers (APPs- Physician Assistants and Nurse Practitioners) who all work together to provide you with the care you need, when you need it.   You may see any of the following providers on your designated Care Team at your next follow up: Dr Glori Bickers Dr Loralie Champagne Dr Patrice Paradise, NP Lyda Jester, [place] Ginnie Smart Audry Riles, PharmD   Please be sure to bring in all your medications bottles to every appointment.

## 2021-03-23 NOTE — Progress Notes (Signed)
Advanced Heart Failure Clinic Note   Referring Physician: PCP: Monico Blitz, MD HF Cardiology: Dr. [name]   HPI: 65 y.o. male w/ h/o HCV s/p liver transplant in 2009 (followed at [facility]), Stage III CKD, HTN and T2DM.   He was admitted 7/21 w/ NSTEMI and Afib w/ RVR, found to have severe 3VD and biventricular heart failure, LVEF 20-25%, RV moderately reduced. Underwent CABG + MAZE + LA appendage clipping 12/19/19. Required milrinone post operatively. Post op course complicated by ATN/ renal failure. Required CVVHD but remained anuric. Tunneled HD cath placed and transitioned to iHD, MWF.  He was able to transition off HD as an outpatient.   He had repeat echo done in 10/21 with EF 20-25%, moderate LV dilation, mild-moderately decreased RV systolic function, moderate MR.   Echo in 4/22 showed EF 30%, mid anterior and inferior severe hypokinesis, basal to mid anterolateral and inferolateral akinesis, mildly decreased RV systolic function.   He returns today for followup of CHF and CAD.  He is in NSR today. He has been doing well generally.  Weight has trended down though he has not been actively trying to lose weight.  He works out 4 days/week.  He denies exertional dyspnea or chest pain.  No lightheadedness.  He says that he is able to jog if he wants.  He has been drinking Ensure to try to get his weight up.    ECG (personally reviewed): NSR, LVH with repolarization abnormality.   Labs (9/21): LDL 66, HDL 44 Labs (10/21): K 3.9, creatinine 2.02 Labs (12/21): K 3.7, creatinine 1.58 Labs (9/21): LDL 66 Labs (1/22): K 4.1, creatinine 1.6 Labs (3/22): K 4.6, creatinine 1.56 => 2.09 Labs (4/22): LDL 66 Labs (8/22): K 5.5, creatinine 1.92  Review of systems complete and found to be negative unless listed in HPI.    PMH: 1. CKD stage III: He was on HD after CABG in 7/21 but renal function recovered.  2. HCV with cirrhosis, s/p liver transplant in 2009.  3. HTN 4. Psoriasis 5. Type 2  diabetes 6. Hyperlipidemia 7. CAD: NSTEMI 7/21.  - CABG (7/21) with LIMA-LAD, RIMA-ramus, sequential SVG-PDA and PLV, SVG -D 8. Atrial fibrillation: Paroxysmal.  - S/p Maze and LA appendage clip with CABG in 7/21.  9. Chronic systolic CHF: Ischemic cardiomyopathy.  - Echo (10/21): EF 20-25%, moderate LV dilation, mild-moderately decreased RV systolic function, moderate MR.  - Echo (4/22): EF 30%, mid anterior and inferior severe hypokinesis, basal to mid anterolateral and inferolateral akinesis, mildly decreased RV systolic function.  Current Outpatient Medications  Medication Sig Dispense Refill   Apremilast (OTEZLA) 30 MG TABS Take 1 tablet by mouth 2 (two) times daily.     atorvastatin (LIPITOR) 80 MG tablet Take 1 tablet (80 mg total) by mouth daily. 30 tablet 11   dapagliflozin propanediol (FARXIGA) 10 MG TABS tablet Take 1 tablet (10 mg total) by mouth daily before breakfast. 14 tablet 0   ELIQUIS 5 MG TABS tablet Take 1 tablet by mouth twice daily 60 tablet 6   LORazepam (ATIVAN) 1 MG tablet Take 1 mg by mouth 2 (two) times daily as needed (panic attacks).      sacubitril-valsartan (ENTRESTO) 24-26 MG Take 1 tablet by mouth 2 (two) times daily. 28 tablet 0   sildenafil (VIAGRA) 50 MG tablet TAKE 1 TABLET BY MOUTH AS NEEDED FOR ERECTILE DYSFUNCTION 20 tablet 0   spironolactone (ALDACTONE) 25 MG tablet Take 1 tablet (25 mg total) by mouth at bedtime. [place]  tablet 3   tacrolimus (PROGRAF) 1 MG capsule Take 2 tabs by mouth every morning & 1 tab every evening     temazepam (RESTORIL) 30 MG capsule Take 30 mg by mouth at bedtime.     triamcinolone cream (KENALOG) 0.1 % Apply 1 application topically 2 (two) times daily.     venlafaxine XR (EFFEXOR-XR) 75 MG 24 hr capsule Take 75 mg by mouth daily.     carvedilol (COREG) 12.5 MG tablet Take 1 tablet (12.5 mg total) by mouth 2 (two) times daily. 60 tablet 5   No current facility-administered medications for this encounter.    Allergies   Allergen Reactions   Morphine And Related Nausea And Vomiting      Social History   Socioeconomic History   Marital status: Widowed    Spouse name: Not on file   Number of children: Not on file   Years of education: Not on file   Highest education level: Not on file  Occupational History   Not on file  Tobacco Use   Smoking status: Former    Packs/day: 1.00    Types: Cigarettes    Quit date: 09/04/2007    Years since quitting: 13.5   Smokeless tobacco: Never  Vaping Use   Vaping Use: Never used  Substance and Sexual Activity   Alcohol use: No   Drug use: No   Sexual activity: Yes    Birth control/protection: None  Other Topics Concern   Not on file  Social History Narrative   Not on file   Social Determinants of Health   Financial Resource Strain: Not on file  Food Insecurity: Not on file  Transportation Needs: Not on file  Physical Activity: Not on file  Stress: Not on file  Social Connections: Not on file  Intimate Partner Violence: Not on file      Family History  Problem Relation Age of Onset   Diabetes Mother    Heart disease Father    Heart disease Son     Vitals:   03/23/21 1341  BP: 120/60  Pulse: 69  SpO2: 98%  Weight: 71 kg (156 lb 9.6 oz)   PHYSICAL EXAM: General: NAD Neck: No JVD, no thyromegaly or thyroid nodule.  Lungs: Clear to auscultation bilaterally with normal respiratory effort. CV: Nondisplaced PMI.  Heart regular S1/S2, no S3/S4, no murmur.  No peripheral edema.  No carotid bruit.  Normal pedal pulses.  Abdomen: Soft, nontender, no hepatosplenomegaly, no distention.  Skin: Intact without lesions or rashes.  Neurologic: Alert and oriented x 3.  Psych: Normal affect. Extremities: No clubbing or cyanosis.  HEENT: Normal.   ASSESSMENT & PLAN:  1. CAD: NSTEMI in 7/21.  Suspect out of hospital inferolateral MI.  Cardiac MRI showed that the inferolateral wall was likely not viable, but the remainder of the LV myocardium was  likely viable. He had CABG 12/19/19 with LIMA-LAD, RIMA-ramus, seq SVG-PD/PL, SVG-D.  No chest pain.  - No ASA given Eliquis use.  - Continue atorvastatin 80 mg qhs. Good lipids in 4/22. 2. Chronic Systolic CHF: Ischemic cardiomyopathy.  Echo 7/21 with EF 20-25%, moderately decreased RV systolic function. Cardiac MRI with LV EF 26%, RV EF 38%, near full thickness scar in inferolateral wall suggesting lack of viability in the LCx territory.  Now post-CABG.  Echo in 10/21 showed EF 20-25%, moderate LV dilation, mild-moderately decreased RV systolic function, moderate MR.  Echo in 4/22 showed EF 30%, mid anterior and inferior severe hypokinesis, basal  to mid anterolateral and inferolateral akinesis, mildly decreased RV systolic function.  Doing well currently, NYHA class I-II symptoms.  He is not volume overloaded.    - Increase Coreg to 12.5 mg bid.     - Continue Entresto 24/26 bid.  - He does not appear to need Lasix.    - Continue Farxiga 10 mg daily.  - Continue spironolactone 25 mg daily. BMET today.   - He has decided that he does not want an ICD.  He understands why I have asked him to consider having one put in.  He will let me know if he changes his mind.  3. Atrial fibrillation:  S/p Maze + LAA clipping on 7/16.  He is off amiodarone.  He is in NSR today.  - Continue Eliquis 5 mg bid.  4. CKD: Stage 3.  He required HD after CABG but renal function recovered. Follow BMET closely.  5. Liver transplant for HCV: Followed at [facility]. On Tacrolimus.   Followup in 3 months.   Loralie Champagne, MD 03/23/21

## 2021-03-23 NOTE — Progress Notes (Signed)
Medication Samples have been provided to the patient.  Drug name: Eliquis       Strength: 5 mg        Qty: 4  LOT: [id number]  Exp.Date: 02/2022  Dosing instructions: Take 1 tablet Twice daily   The patient has been instructed regarding the correct time, dose, and frequency of taking this medication, including desired effects and most common side effects.   Juanita Laster Sakib Noguez 2:06 PM 03/23/2021

## 2021-03-24 ENCOUNTER — Telehealth (HOSPITAL_COMMUNITY): Payer: Self-pay | Admitting: Cardiology

## 2021-03-24 DIAGNOSIS — I5022 Chronic systolic (congestive) heart failure: Secondary | ICD-10-CM

## 2021-04-01 NOTE — Telephone Encounter (Signed)
[place], RN  04/01/2021  1:44 PM EDT Back to Top    Spoke w/pt, he is aware, he will go to [facility] for repeat labs next week   Kerry Dory, [facility]  03/24/2021  5:12 PM EDT     Patient called.  Unable to reach patient.[phone] Jerilynn Mages)   Larey Dresser, MD  03/23/2021  4:00 PM EDT     Low K diet, no K supplement, repeat BMET in 10 days.

## 2021-04-05 ENCOUNTER — Other Ambulatory Visit (HOSPITAL_COMMUNITY): Payer: Self-pay | Admitting: Cardiology

## 2021-04-07 ENCOUNTER — Other Ambulatory Visit (HOSPITAL_COMMUNITY): Payer: Self-pay | Admitting: *Deleted

## 2021-04-07 ENCOUNTER — Telehealth (HOSPITAL_COMMUNITY): Payer: Self-pay | Admitting: Pharmacy Technician

## 2021-04-07 DIAGNOSIS — N189 Chronic kidney disease, unspecified: Secondary | ICD-10-CM | POA: Diagnosis not present

## 2021-04-07 DIAGNOSIS — R809 Proteinuria, unspecified: Secondary | ICD-10-CM | POA: Diagnosis not present

## 2021-04-07 DIAGNOSIS — N1832 Chronic kidney disease, stage 3b: Secondary | ICD-10-CM | POA: Diagnosis not present

## 2021-04-07 DIAGNOSIS — D631 Anemia in chronic kidney disease: Secondary | ICD-10-CM | POA: Diagnosis not present

## 2021-04-07 DIAGNOSIS — I129 Hypertensive chronic kidney disease with stage 1 through stage 4 chronic kidney disease, or unspecified chronic kidney disease: Secondary | ICD-10-CM | POA: Diagnosis not present

## 2021-04-07 DIAGNOSIS — N181 Chronic kidney disease, stage 1: Secondary | ICD-10-CM | POA: Diagnosis not present

## 2021-04-07 DIAGNOSIS — I509 Heart failure, unspecified: Secondary | ICD-10-CM | POA: Diagnosis not present

## 2021-04-07 MED ORDER — DAPAGLIFLOZIN PROPANEDIOL 10 MG PO TABS: 10.0000 mg | ORAL_TABLET | Freq: Every day | ORAL | 3 refills | Status: DC

## 2021-04-07 NOTE — Telephone Encounter (Signed)
Advanced Heart Failure Patient Advocate Encounter  Spoke to patient regarding re-enrollment of Farxiga assistance with [employer]. Emailed the application for the patient to sign.   Will follow up.  Of note, spoke with patient regarding changes for [employer] re-enrollment. They now require physical POI. The patient is aware that he should receive an application from the manufacturer in the mail. Advised him to send it to the office once he has POI and we can send in for him.

## 2021-04-08 DIAGNOSIS — E875 Hyperkalemia: Secondary | ICD-10-CM | POA: Diagnosis not present

## 2021-04-08 DIAGNOSIS — R809 Proteinuria, unspecified: Secondary | ICD-10-CM | POA: Diagnosis not present

## 2021-04-08 DIAGNOSIS — E211 Secondary hyperparathyroidism, not elsewhere classified: Secondary | ICD-10-CM | POA: Diagnosis not present

## 2021-04-08 DIAGNOSIS — N170 Acute kidney failure with tubular necrosis: Secondary | ICD-10-CM | POA: Diagnosis not present

## 2021-04-08 DIAGNOSIS — I5022 Chronic systolic (congestive) heart failure: Secondary | ICD-10-CM | POA: Diagnosis not present

## 2021-04-08 DIAGNOSIS — N1832 Chronic kidney disease, stage 3b: Secondary | ICD-10-CM | POA: Diagnosis not present

## 2021-04-15 ENCOUNTER — Telehealth (HOSPITAL_COMMUNITY): Payer: Self-pay | Admitting: *Deleted

## 2021-04-15 ENCOUNTER — Encounter (HOSPITAL_COMMUNITY): Payer: Self-pay

## 2021-04-15 NOTE — Telephone Encounter (Signed)
Lab order faxed to [facility] to [place] today at 2:18pm. Fax # [phone]

## 2021-04-29 DIAGNOSIS — I10 Essential (primary) hypertension: Secondary | ICD-10-CM | POA: Diagnosis not present

## 2021-04-29 DIAGNOSIS — I5021 Acute systolic (congestive) heart failure: Secondary | ICD-10-CM | POA: Diagnosis not present

## 2021-04-29 DIAGNOSIS — G4700 Insomnia, unspecified: Secondary | ICD-10-CM | POA: Diagnosis not present

## 2021-04-29 DIAGNOSIS — R69 Illness, unspecified: Secondary | ICD-10-CM | POA: Diagnosis not present

## 2021-04-29 DIAGNOSIS — Z299 Encounter for prophylactic measures, unspecified: Secondary | ICD-10-CM | POA: Diagnosis not present

## 2021-04-29 DIAGNOSIS — D899 Disorder involving the immune mechanism, unspecified: Secondary | ICD-10-CM | POA: Diagnosis not present

## 2021-05-02 DIAGNOSIS — E875 Hyperkalemia: Secondary | ICD-10-CM | POA: Diagnosis not present

## 2021-05-02 DIAGNOSIS — R809 Proteinuria, unspecified: Secondary | ICD-10-CM | POA: Diagnosis not present

## 2021-05-02 DIAGNOSIS — N1832 Chronic kidney disease, stage 3b: Secondary | ICD-10-CM | POA: Diagnosis not present

## 2021-05-02 DIAGNOSIS — I5022 Chronic systolic (congestive) heart failure: Secondary | ICD-10-CM | POA: Diagnosis not present

## 2021-05-04 DIAGNOSIS — R809 Proteinuria, unspecified: Secondary | ICD-10-CM | POA: Diagnosis not present

## 2021-05-04 DIAGNOSIS — I5022 Chronic systolic (congestive) heart failure: Secondary | ICD-10-CM | POA: Diagnosis not present

## 2021-05-04 DIAGNOSIS — N1832 Chronic kidney disease, stage 3b: Secondary | ICD-10-CM | POA: Diagnosis not present

## 2021-05-04 DIAGNOSIS — E211 Secondary hyperparathyroidism, not elsewhere classified: Secondary | ICD-10-CM | POA: Diagnosis not present

## 2021-05-04 DIAGNOSIS — E875 Hyperkalemia: Secondary | ICD-10-CM | POA: Diagnosis not present

## 2021-05-04 DIAGNOSIS — I9589 Other hypotension: Secondary | ICD-10-CM | POA: Diagnosis not present

## 2021-05-05 ENCOUNTER — Encounter (HOSPITAL_COMMUNITY): Payer: Self-pay | Admitting: Cardiology

## 2021-05-05 ENCOUNTER — Telehealth (HOSPITAL_COMMUNITY): Payer: Self-pay | Admitting: *Deleted

## 2021-05-05 DIAGNOSIS — B998 Other infectious disease: Secondary | ICD-10-CM | POA: Diagnosis not present

## 2021-05-05 DIAGNOSIS — D849 Immunodeficiency, unspecified: Secondary | ICD-10-CM | POA: Diagnosis not present

## 2021-05-05 NOTE — Telephone Encounter (Signed)
Order for labs faxed to [facility].  Fax # [phone]

## 2021-05-10 ENCOUNTER — Other Ambulatory Visit (HOSPITAL_COMMUNITY): Payer: Self-pay

## 2021-05-10 MED ORDER — ENTRESTO 24-26 MG PO TABS: 1.0000 | ORAL_TABLET | Freq: Two times a day (BID) | ORAL | 0 refills | Status: DC

## 2021-05-24 DIAGNOSIS — L570 Actinic keratosis: Secondary | ICD-10-CM | POA: Diagnosis not present

## 2021-05-25 DIAGNOSIS — J069 Acute upper respiratory infection, unspecified: Secondary | ICD-10-CM | POA: Diagnosis not present

## 2021-05-25 DIAGNOSIS — Z2821 Immunization not carried out because of patient refusal: Secondary | ICD-10-CM | POA: Diagnosis not present

## 2021-05-25 DIAGNOSIS — I509 Heart failure, unspecified: Secondary | ICD-10-CM | POA: Diagnosis not present

## 2021-05-25 DIAGNOSIS — Z299 Encounter for prophylactic measures, unspecified: Secondary | ICD-10-CM | POA: Diagnosis not present

## 2021-05-25 DIAGNOSIS — Z87891 Personal history of nicotine dependence: Secondary | ICD-10-CM | POA: Diagnosis not present

## 2021-06-24 ENCOUNTER — Encounter (HOSPITAL_COMMUNITY): Payer: Medicare HMO | Admitting: Cardiology

## 2021-07-05 DIAGNOSIS — D225 Melanocytic nevi of trunk: Secondary | ICD-10-CM | POA: Diagnosis not present

## 2021-07-05 DIAGNOSIS — L578 Other skin changes due to chronic exposure to nonionizing radiation: Secondary | ICD-10-CM | POA: Diagnosis not present

## 2021-07-05 DIAGNOSIS — R634 Abnormal weight loss: Secondary | ICD-10-CM | POA: Diagnosis not present

## 2021-07-05 DIAGNOSIS — L814 Other melanin hyperpigmentation: Secondary | ICD-10-CM | POA: Diagnosis not present

## 2021-07-05 DIAGNOSIS — Z125 Encounter for screening for malignant neoplasm of prostate: Secondary | ICD-10-CM | POA: Diagnosis not present

## 2021-07-05 DIAGNOSIS — Z5181 Encounter for therapeutic drug level monitoring: Secondary | ICD-10-CM | POA: Diagnosis not present

## 2021-07-05 DIAGNOSIS — D849 Immunodeficiency, unspecified: Secondary | ICD-10-CM | POA: Diagnosis not present

## 2021-07-05 DIAGNOSIS — L409 Psoriasis, unspecified: Secondary | ICD-10-CM | POA: Diagnosis not present

## 2021-07-05 DIAGNOSIS — Z944 Liver transplant status: Secondary | ICD-10-CM | POA: Diagnosis not present

## 2021-07-05 DIAGNOSIS — D2270 Melanocytic nevi of unspecified lower limb, including hip: Secondary | ICD-10-CM | POA: Diagnosis not present

## 2021-07-05 DIAGNOSIS — Z85828 Personal history of other malignant neoplasm of skin: Secondary | ICD-10-CM | POA: Diagnosis not present

## 2021-07-05 DIAGNOSIS — L570 Actinic keratosis: Secondary | ICD-10-CM | POA: Diagnosis not present

## 2021-07-05 DIAGNOSIS — D2260 Melanocytic nevi of unspecified upper limb, including shoulder: Secondary | ICD-10-CM | POA: Diagnosis not present

## 2021-07-05 DIAGNOSIS — D485 Neoplasm of uncertain behavior of skin: Secondary | ICD-10-CM | POA: Diagnosis not present

## 2021-07-11 DIAGNOSIS — Z944 Liver transplant status: Secondary | ICD-10-CM | POA: Diagnosis not present

## 2021-07-11 DIAGNOSIS — Z1329 Encounter for screening for other suspected endocrine disorder: Secondary | ICD-10-CM | POA: Diagnosis not present

## 2021-07-11 DIAGNOSIS — Z125 Encounter for screening for malignant neoplasm of prostate: Secondary | ICD-10-CM | POA: Diagnosis not present

## 2021-07-29 DIAGNOSIS — I10 Essential (primary) hypertension: Secondary | ICD-10-CM | POA: Diagnosis not present

## 2021-07-29 DIAGNOSIS — Z87891 Personal history of nicotine dependence: Secondary | ICD-10-CM | POA: Diagnosis not present

## 2021-07-29 DIAGNOSIS — I252 Old myocardial infarction: Secondary | ICD-10-CM | POA: Diagnosis not present

## 2021-07-29 DIAGNOSIS — N19 Unspecified kidney failure: Secondary | ICD-10-CM | POA: Diagnosis not present

## 2021-07-29 DIAGNOSIS — R69 Illness, unspecified: Secondary | ICD-10-CM | POA: Diagnosis not present

## 2021-07-29 DIAGNOSIS — R809 Proteinuria, unspecified: Secondary | ICD-10-CM | POA: Diagnosis not present

## 2021-07-29 DIAGNOSIS — I509 Heart failure, unspecified: Secondary | ICD-10-CM | POA: Diagnosis not present

## 2021-07-29 DIAGNOSIS — E211 Secondary hyperparathyroidism, not elsewhere classified: Secondary | ICD-10-CM | POA: Diagnosis not present

## 2021-07-29 DIAGNOSIS — E875 Hyperkalemia: Secondary | ICD-10-CM | POA: Diagnosis not present

## 2021-07-29 DIAGNOSIS — L4050 Arthropathic psoriasis, unspecified: Secondary | ICD-10-CM | POA: Diagnosis not present

## 2021-07-29 DIAGNOSIS — G4700 Insomnia, unspecified: Secondary | ICD-10-CM | POA: Diagnosis not present

## 2021-07-29 DIAGNOSIS — I9589 Other hypotension: Secondary | ICD-10-CM | POA: Diagnosis not present

## 2021-07-29 DIAGNOSIS — Z299 Encounter for prophylactic measures, unspecified: Secondary | ICD-10-CM | POA: Diagnosis not present

## 2021-08-01 DIAGNOSIS — Z7189 Other specified counseling: Secondary | ICD-10-CM | POA: Diagnosis not present

## 2021-08-01 DIAGNOSIS — I5021 Acute systolic (congestive) heart failure: Secondary | ICD-10-CM | POA: Diagnosis not present

## 2021-08-01 DIAGNOSIS — Z6824 Body mass index (BMI) 24.0-24.9, adult: Secondary | ICD-10-CM | POA: Diagnosis not present

## 2021-08-01 DIAGNOSIS — I482 Chronic atrial fibrillation, unspecified: Secondary | ICD-10-CM | POA: Diagnosis not present

## 2021-08-01 DIAGNOSIS — Z0000 Encounter for general adult medical examination without abnormal findings: Secondary | ICD-10-CM | POA: Diagnosis not present

## 2021-08-01 DIAGNOSIS — Z1339 Encounter for screening examination for other mental health and behavioral disorders: Secondary | ICD-10-CM | POA: Diagnosis not present

## 2021-08-01 DIAGNOSIS — I10 Essential (primary) hypertension: Secondary | ICD-10-CM | POA: Diagnosis not present

## 2021-08-01 DIAGNOSIS — N1832 Chronic kidney disease, stage 3b: Secondary | ICD-10-CM | POA: Diagnosis not present

## 2021-08-01 DIAGNOSIS — Z1331 Encounter for screening for depression: Secondary | ICD-10-CM | POA: Diagnosis not present

## 2021-08-02 DIAGNOSIS — E7800 Pure hypercholesterolemia, unspecified: Secondary | ICD-10-CM | POA: Diagnosis not present

## 2021-08-03 DIAGNOSIS — N170 Acute kidney failure with tubular necrosis: Secondary | ICD-10-CM | POA: Diagnosis not present

## 2021-08-03 DIAGNOSIS — R809 Proteinuria, unspecified: Secondary | ICD-10-CM | POA: Diagnosis not present

## 2021-08-03 DIAGNOSIS — D638 Anemia in other chronic diseases classified elsewhere: Secondary | ICD-10-CM | POA: Diagnosis not present

## 2021-08-03 DIAGNOSIS — I5022 Chronic systolic (congestive) heart failure: Secondary | ICD-10-CM | POA: Diagnosis not present

## 2021-08-03 DIAGNOSIS — E211 Secondary hyperparathyroidism, not elsewhere classified: Secondary | ICD-10-CM | POA: Diagnosis not present

## 2021-08-03 DIAGNOSIS — E875 Hyperkalemia: Secondary | ICD-10-CM | POA: Diagnosis not present

## 2021-08-03 DIAGNOSIS — N1832 Chronic kidney disease, stage 3b: Secondary | ICD-10-CM | POA: Diagnosis not present

## 2021-08-11 DIAGNOSIS — C44329 Squamous cell carcinoma of skin of other parts of face: Secondary | ICD-10-CM | POA: Diagnosis not present

## 2021-08-11 DIAGNOSIS — C4442 Squamous cell carcinoma of skin of scalp and neck: Secondary | ICD-10-CM | POA: Diagnosis not present

## 2021-08-13 ENCOUNTER — Other Ambulatory Visit (HOSPITAL_COMMUNITY): Payer: Self-pay | Admitting: Cardiology

## 2021-08-15 ENCOUNTER — Other Ambulatory Visit (HOSPITAL_COMMUNITY): Payer: Self-pay | Admitting: Cardiology

## 2021-08-15 MED ORDER — ENTRESTO 24-26 MG PO TABS: 1.0000 | ORAL_TABLET | Freq: Two times a day (BID) | ORAL | 3 refills | Status: DC

## 2021-08-17 ENCOUNTER — Other Ambulatory Visit (HOSPITAL_COMMUNITY): Payer: Self-pay | Admitting: Cardiology

## 2021-08-26 ENCOUNTER — Ambulatory Visit (HOSPITAL_COMMUNITY)
Admission: RE | Admit: 2021-08-26 | Discharge: 2021-08-26 | Disposition: A | Discharge status: Home / Self Care | Payer: Medicare HMO | Source: Ambulatory Visit | Attending: Cardiology | Admitting: Cardiology

## 2021-08-26 ENCOUNTER — Encounter (HOSPITAL_COMMUNITY): Payer: Self-pay | Admitting: Cardiology

## 2021-08-26 ENCOUNTER — Telehealth (HOSPITAL_COMMUNITY): Payer: Self-pay | Admitting: *Deleted

## 2021-08-26 ENCOUNTER — Other Ambulatory Visit (HOSPITAL_COMMUNITY): Payer: Self-pay

## 2021-08-26 ENCOUNTER — Other Ambulatory Visit: Payer: Self-pay

## 2021-08-26 VITALS — BP 122/70 | HR 69 | Wt 160.6 lb

## 2021-08-26 DIAGNOSIS — Z944 Liver transplant status: Secondary | ICD-10-CM | POA: Insufficient documentation

## 2021-08-26 DIAGNOSIS — E1122 Type 2 diabetes mellitus with diabetic chronic kidney disease: Secondary | ICD-10-CM | POA: Diagnosis not present

## 2021-08-26 DIAGNOSIS — Z7901 Long term (current) use of anticoagulants: Secondary | ICD-10-CM | POA: Diagnosis not present

## 2021-08-26 DIAGNOSIS — I480 Paroxysmal atrial fibrillation: Secondary | ICD-10-CM | POA: Insufficient documentation

## 2021-08-26 DIAGNOSIS — I252 Old myocardial infarction: Secondary | ICD-10-CM | POA: Diagnosis not present

## 2021-08-26 DIAGNOSIS — I2510 Atherosclerotic heart disease of native coronary artery without angina pectoris: Secondary | ICD-10-CM | POA: Diagnosis not present

## 2021-08-26 DIAGNOSIS — I5023 Acute on chronic systolic (congestive) heart failure: Secondary | ICD-10-CM

## 2021-08-26 DIAGNOSIS — I5022 Chronic systolic (congestive) heart failure: Secondary | ICD-10-CM | POA: Insufficient documentation

## 2021-08-26 DIAGNOSIS — I255 Ischemic cardiomyopathy: Secondary | ICD-10-CM | POA: Insufficient documentation

## 2021-08-26 DIAGNOSIS — N183 Chronic kidney disease, stage 3 unspecified: Secondary | ICD-10-CM | POA: Insufficient documentation

## 2021-08-26 DIAGNOSIS — Z951 Presence of aortocoronary bypass graft: Secondary | ICD-10-CM | POA: Diagnosis not present

## 2021-08-26 DIAGNOSIS — I130 Hypertensive heart and chronic kidney disease with heart failure and stage 1 through stage 4 chronic kidney disease, or unspecified chronic kidney disease: Secondary | ICD-10-CM | POA: Diagnosis not present

## 2021-08-26 LAB — BASIC METABOLIC PANEL
Anion gap: 4 — ABNORMAL LOW (ref 5–15)
BUN: 30 mg/dL — ABNORMAL HIGH (ref 8–23)
CO2: 23 mmol/L (ref 22–32)
Calcium: 8.2 mg/dL — ABNORMAL LOW (ref 8.9–10.3)
Chloride: 110 mmol/L (ref 98–111)
Creatinine, Ser: 1.9 mg/dL — ABNORMAL HIGH (ref 0.61–1.24)
GFR, Estimated: 39 mL/min — ABNORMAL LOW (ref >60)
Glucose, Bld: 89 mg/dL (ref 70–99)
Potassium: 5.8 mmol/L — ABNORMAL HIGH (ref 3.5–5.1)
Sodium: 137 mmol/L (ref 135–145)

## 2021-08-26 MED ORDER — SPIRONOLACTONE 25 MG PO TABS: 12.5000 mg | ORAL_TABLET | Freq: Every day | ORAL | 3 refills | Status: DC

## 2021-08-26 MED ORDER — ATORVASTATIN CALCIUM 80 MG PO TABS: 80.0000 mg | ORAL_TABLET | Freq: Every day | ORAL | 3 refills | Status: DC

## 2021-08-26 MED ORDER — CARVEDILOL 6.25 MG PO TABS: 6.2500 mg | ORAL_TABLET | Freq: Two times a day (BID) | ORAL | 6 refills | Status: DC

## 2021-08-26 MED ORDER — LOKELMA 5 G PO PACK: 5.0000 g | PACK | Freq: Once | ORAL | 0 refills | Status: AC

## 2021-08-26 NOTE — Telephone Encounter (Signed)
Mychart mess sent  ? Scarlette Calico, RN  ?08/26/2021  5:12 PM EDT   ?  ?Left message to call back on pt's number, called pt's Son, [name], he states pt is at [facility] and left phone at home, he request we send instructions through [place] and he will ensure pt reviews it and gets Lokelma picked up  ? ?

## 2021-08-26 NOTE — Telephone Encounter (Signed)
-----   Message from Larey Dresser, MD sent at 08/26/2021  5:02 PM EDT ----- ?Give Lokelma 5 g x 1.  Hold spironolactone until Sunday and decrease dose to 12.5 mg daily.  BMET next week.  ?

## 2021-08-26 NOTE — Patient Instructions (Signed)
Medication Changes: ? ?Increase Carvedilol to 6.25 mg Twice daily ? ?Restart your Atrovastartin ? ?Lab Work: ? ?Labs done today, your results will be available in MyChart, we will contact you for abnormal readings. ? ? ?Testing/Procedures: ? ?Your physician has requested that you have an echocardiogram. Echocardiography is a painless test that uses sound waves to create images of your heart. It provides your doctor with information about the size and shape of your heart and how well your heart?s chambers and valves are working. This procedure takes approximately one hour. There are no restrictions for this procedure. ? ? ?Referrals: ? ?none ? ?Special Instructions // Education: ? ?none ? ?Follow-Up in: 4 months with echocardiogram (July 2023)  **please call the office in Mid May to arrange your follow up appointment ** ? ?At the [facility], you and your health needs are our priority. We have a designated team specialized in the treatment of Heart Failure. This Care Team includes your primary Heart Failure Specialized Cardiologist (physician), Advanced Practice Providers (APPs- Physician Assistants and Nurse Practitioners), and Pharmacist who all work together to provide you with the care you need, when you need it.  ? ?You may see any of the following providers on your designated Care Team at your next follow up: ? ?Dr Glori Bickers ?Dr Loralie Champagne ?Darrick Grinder, NP ?Lyda Jester, PA ?Jessica Milford,NP ?Marlyce Huge, PA ?Audry Riles, PharmD ? ? ?Please be sure to bring in all your medications bottles to every appointment.  ? ?Need to Contact us: ? ?If you have any questions or concerns before your next appointment please send [place] a message through [place] or call our office at [phone].   ? ?TO LEAVE A MESSAGE FOR THE NURSE SELECT OPTION 2, PLEASE LEAVE A MESSAGE INCLUDING: ?YOUR NAME ?DATE OF BIRTH ?CALL BACK NUMBER ?REASON FOR CALL**this is important as we prioritize the call  backs ? ?YOU WILL RECEIVE A CALL BACK THE SAME DAY AS LONG AS YOU CALL BEFORE 4:00 PM ? ? ?

## 2021-08-26 NOTE — Progress Notes (Signed)
Medication Samples have been provided to the patient. ? ?Drug name: Entresto       Strength: 24/26        Qty: 2  bottles  LOT: [id number]  Exp.Date: 05/25 ? ?Dosing instructions: take 1 tablet Twice daily ? ?The patient has been instructed regarding the correct time, dose, and frequency of taking this medication, including desired effects and most common side effects.  ? ?Medication Samples have been provided to the patient. ? ?Drug name: Phineas Real       Strength: '10mg'$         Qty: 4 boxes  LOT: [id number]  Exp.Date: 04-04-2024 ? ?Dosing instructions: take 1 tablet daily. ? ?The patient has been instructed regarding the correct time, dose, and frequency of taking this medication, including desired effects and most common side effects.  ? ?Brinden Kincheloe M Tramya Schoenfelder ?3:10 PM ?08/26/2021 ? ? ? ?

## 2021-08-28 NOTE — Progress Notes (Signed)
?Advanced Heart Failure Clinic Note  ? ?Referring Physician: ?PCP: Monico Blitz, MD ?HF Cardiology: Dr. [name]  ? ?HPI: ?66 y.o. male w/ h/o HCV s/p liver transplant in 2009 (followed at [facility]), Stage III CKD, HTN and T2DM.   He was admitted 7/21 w/ NSTEMI and Afib w/ RVR, found to have severe 3VD and biventricular heart failure, LVEF 20-25%, RV moderately reduced. Underwent CABG + MAZE + LA appendage clipping 12/19/19. Required milrinone post operatively. Post op course complicated by ATN/ renal failure. Required CVVHD but remained anuric. Tunneled HD cath placed and transitioned to iHD, MWF.  He was able to transition off HD as an outpatient.  ? ?He had repeat echo done in 10/21 with EF 20-25%, moderate LV dilation, mild-moderately decreased RV systolic function, moderate MR.  ? ?Echo in 4/22 showed EF 30%, mid anterior and inferior severe hypokinesis, basal to mid anterolateral and inferolateral akinesis, mildly decreased RV systolic function.  ? ?He returns today for followup of CHF and CAD.  He is in NSR today. He is only taking Coreg 3.125 mg bid, not sure why dose is less than prior.  He is not taking statin, not sure why.  No exertional dyspnea or chest pain.  He works out at gym most days of the week. No lightheadedness.  No palpitations.  Weight up 4 lbs.  He has had trouble recently with hyperkalemia.   ? ?ECG (personally reviewed): NSR, anterolateral TWIs.  ? ?Labs (9/21): LDL 66, HDL 44 ?Labs (10/21): K 3.9, creatinine 2.02 ?Labs (12/21): K 3.7, creatinine 1.58 ?Labs (9/21): LDL 66 ?Labs (1/22): K 4.1, creatinine 1.6 ?Labs (3/22): K 4.6, creatinine 1.56 => 2.09 ?Labs (4/22): LDL 66 ?Labs (8/22): K 5.5, creatinine 1.92 ?Labs (2/23): K 6.2, creatinine 2.36 ? ?Review of systems complete and found to be negative unless listed in HPI.   ? ?PMH: ?1. CKD stage III: He was on HD after CABG in 7/21 but renal function recovered.  ?2. HCV with cirrhosis, s/p liver transplant in 2009.  ?3. HTN ?4. Psoriasis ?5.  Type 2 diabetes ?6. Hyperlipidemia ?7. CAD: NSTEMI 7/21.  ?- CABG (7/21) with LIMA-LAD, RIMA-ramus, sequential SVG-PDA and PLV, SVG -D ?8. Atrial fibrillation: Paroxysmal.  ?- S/p Maze and LA appendage clip with CABG in 7/21.  ?9. Chronic systolic CHF: Ischemic cardiomyopathy.  ?- Echo (10/21): EF 20-25%, moderate LV dilation, mild-moderately decreased RV systolic function, moderate MR.  ?- Echo (4/22): EF 30%, mid anterior and inferior severe hypokinesis, basal to mid anterolateral and inferolateral akinesis, mildly decreased RV systolic function. ? ?Current Outpatient Medications  ?Medication Sig Dispense Refill  ? Apremilast (OTEZLA) 30 MG TABS Take 1 tablet by mouth 2 (two) times daily.    ? dapagliflozin propanediol (FARXIGA) 10 MG TABS tablet Take 1 tablet (10 mg total) by mouth daily before breakfast. 90 tablet 3  ? ELIQUIS 5 MG TABS tablet Take 1 tablet by mouth twice daily 60 tablet 6  ? LORazepam (ATIVAN) 1 MG tablet Take 1 mg by mouth 2 (two) times daily as needed (panic attacks).     ? sacubitril-valsartan (ENTRESTO) 24-26 MG Take 1 tablet by mouth 2 (two) times daily. 180 tablet 3  ? sildenafil (VIAGRA) 50 MG tablet TAKE 1 TABLET BY MOUTH AS NEEDED FOR ERECTILE DYSFUNCTION 20 tablet 0  ? tacrolimus (PROGRAF) 1 MG capsule Take 2 tabs by mouth every morning & 1 tab every evening    ? temazepam (RESTORIL) 30 MG capsule Take 30 mg by mouth at bedtime.    ?  triamcinolone cream (KENALOG) 0.1 % Apply 1 application topically 2 (two) times daily.    ? venlafaxine XR (EFFEXOR-XR) 75 MG 24 hr capsule Take 75 mg by mouth daily.    ? atorvastatin (LIPITOR) 80 MG tablet Take 1 tablet (80 mg total) by mouth daily. 90 tablet 3  ? carvedilol (COREG) 6.25 MG tablet Take 1 tablet (6.25 mg total) by mouth 2 (two) times daily with a meal. 60 tablet 6  ? spironolactone (ALDACTONE) 25 MG tablet Take 0.5 tablets (12.5 mg total) by mouth at bedtime. 90 tablet 3  ? ?No current facility-administered medications for this  encounter.  ? ? ?Allergies  ?Allergen Reactions  ? Morphine And Related Nausea And Vomiting  ? ? ?  ?Social History  ? ?Socioeconomic History  ? Marital status: Widowed  ?  Spouse name: Not on file  ? Number of children: Not on file  ? Years of education: Not on file  ? Highest education level: Not on file  ?Occupational History  ? Not on file  ?Tobacco Use  ? Smoking status: Former  ?  Packs/day: 1.00  ?  Types: Cigarettes  ?  Quit date: 09/04/2007  ?  Years since quitting: 13.9  ? Smokeless tobacco: Never  ?Vaping Use  ? Vaping Use: Never used  ?Substance and Sexual Activity  ? Alcohol use: No  ? Drug use: No  ? Sexual activity: Yes  ?  Birth control/protection: None  ?Other Topics Concern  ? Not on file  ?Social History Narrative  ? Not on file  ? ?Social Determinants of Health  ? ?Financial Resource Strain: Not on file  ?Food Insecurity: Not on file  ?Transportation Needs: Not on file  ?Physical Activity: Not on file  ?Stress: Not on file  ?Social Connections: Not on file  ?Intimate Partner Violence: Not on file  ? ? ?  ?Family History  ?Problem Relation Age of Onset  ? Diabetes Mother   ? Heart disease Father   ? Heart disease Son   ? ? ?Vitals:  ? 08/26/21 1432  ?BP: 122/70  ?Pulse: 69  ?SpO2: 98%  ?Weight: 72.8 kg (160 lb 9.6 oz)  ? ?PHYSICAL EXAM: ?General: NAD ?Neck: No JVD, no thyromegaly or thyroid nodule.  ?Lungs: Clear to auscultation bilaterally with normal respiratory effort. ?CV: Nondisplaced PMI.  Heart regular S1/S2, no S3/S4, no murmur.  No peripheral edema.  No carotid bruit.  Normal pedal pulses.  ?Abdomen: Soft, nontender, no hepatosplenomegaly, no distention.  ?Skin: Intact without lesions or rashes.  ?Neurologic: Alert and oriented x 3.  ?Psych: Normal affect. ?Extremities: No clubbing or cyanosis.  ?HEENT: Normal.  ? ?ASSESSMENT & PLAN: ? ?1. CAD: NSTEMI in 7/21.  Suspect out of hospital inferolateral MI.  Cardiac MRI showed that the inferolateral wall was likely not viable, but the remainder  of the LV myocardium was likely viable. He had CABG 12/19/19 with LIMA-LAD, RIMA-ramus, seq SVG-PD/PL, SVG-D.  No chest pain.  ?- No ASA given Eliquis use.  ?- Continue atorvastatin 80 mg qhs.  ?2. Chronic Systolic CHF: Ischemic cardiomyopathy.  Echo 7/21 with EF 20-25%, moderately decreased RV systolic function. Cardiac MRI with LV EF 26%, RV EF 38%, near full thickness scar in inferolateral wall suggesting lack of viability in the LCx territory.  Now post-CABG.  Echo in 10/21 showed EF 20-25%, moderate LV dilation, mild-moderately decreased RV systolic function, moderate MR.  Echo in 4/22 showed EF 30%, mid anterior and inferior severe hypokinesis, basal to mid  anterolateral and inferolateral akinesis, mildly decreased RV systolic function.  Doing well currently, NYHA class I-II symptoms.  He is not volume overloaded.    ?- Increase Coreg to 6.25 mg bid.     ?- Continue Entresto 24/26 bid.  ?- He does not appear to need Lasix.    ?- Continue Farxiga 10 mg daily.  ?- Continue spironolactone 25 mg daily but need to check BMET today with recent hyperkalemia (may have to cut back).  ?- He has decided that he does not want an ICD.  He understands why I have asked him to consider having one put in.  He will let me know if he changes his mind.  ?- Repeat echo at followup in 3 months.  ?3. Atrial fibrillation:  S/p Maze + LAA clipping on 7/16.  He is off amiodarone.  He is in NSR today.  ?- Continue Eliquis 5 mg bid.  ?4. CKD: Stage 3.  He required HD after CABG but renal function recovered. Follow BMET closely.  ?5. Liver transplant for HCV: Followed at [facility]. On Tacrolimus.  ? ?Followup in 3 wks with pharmacist x 2 visits for medication titration. Followup in 3 months with echo.  ? ?Loralie Champagne, MD ?08/28/21 ?

## 2021-08-29 ENCOUNTER — Telehealth (HOSPITAL_COMMUNITY): Payer: Self-pay | Admitting: Pharmacy Technician

## 2021-08-29 ENCOUNTER — Other Ambulatory Visit (HOSPITAL_COMMUNITY): Payer: Self-pay | Admitting: *Deleted

## 2021-08-29 ENCOUNTER — Other Ambulatory Visit (HOSPITAL_COMMUNITY): Payer: Self-pay

## 2021-08-29 MED ORDER — ENTRESTO 24-26 MG PO TABS: 1.0000 | ORAL_TABLET | Freq: Two times a day (BID) | ORAL | 3 refills | Status: DC

## 2021-08-29 MED ORDER — DAPAGLIFLOZIN PROPANEDIOL 10 MG PO TABS: 10.0000 mg | ORAL_TABLET | Freq: Every day | ORAL | 3 refills | Status: DC

## 2021-08-29 NOTE — Telephone Encounter (Signed)
Advanced Heart Failure Patient Advocate Encounter ? ?The patient was approved for a [employer] that will help cover the copay of Entresto and Farxiga. Total amount awarded, $10,000. Eligibility, 07/30/21-07/29/22. ? ?ID [id number] ? ?BIN [id number] ? ?PCN [id number] ? ?Group [id number] ? ?Called and left the patient a detailed voicemail. Conservator, museum/gallery. Sent 90 day RX request to [facility] ([place]) to send to [place] with the attached grant information.  ? ?Charlann Boxer, CPhT ? ?

## 2021-08-30 NOTE — Telephone Encounter (Signed)
Patient called back stating he has not seen email yet. Went ahead and resent email. Provided billing information for grant over the phone as well. ? ?Charlann Boxer, CPhT ? ?

## 2021-08-30 NOTE — Telephone Encounter (Signed)
Scarlette Calico, RN  ?08/30/2021 12:39 PM EDT Back to Top  ?  ?Spoke w/pt, she states he received message and took the [place] on Sat, he will go to [facility] for repeat labs on [place] or [place], lab order faxed to them at [phone]  ? ?

## 2021-09-15 IMAGING — MR MR CARD MORPHOLOGY WO/W CM
45 of 48 series · 45 of 48 positions shown · IV contrast (Contrast agent)
Comparison: none

CLINICAL DATA: Ischemic Cardiomyopathy Viability

EXAM:
CARDIAC MRI
TECHNIQUE: The patient was scanned on a 1.5 Tesla GE magnet. A dedicated
cardiac coil was used. Functional imaging was done using Fiesta
sequences. [DATE], and 4 chamber views were done to assess for RWMA's.
Modified Cebatorius rule using a short axis stack was used to
calculate an ejection fraction on a dedicated work station using
Circle software. The patient received 8 cc of Gadavist . After 10
minutes inversion recovery sequences were used to assess for
infiltration and scar tissue.
CONTRAST:  Gadavist

[Series 4: t2_[id number]_bh · axial · 8.0mm · 1.48mm/px · 1 of 18 slices shown]
[im 1/18]
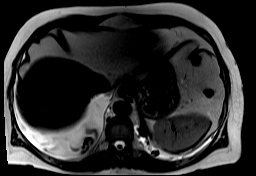

[Series 8: bSSFP · oblique · 8.0mm · 1.61mm/px · 1 of 25 slices shown (1 of 28)]
[im 1/25]
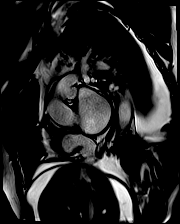

[Series 8: bSSFP · oblique · 8.0mm · 1.61mm/px · 1 of 25 slices shown (2 of 28)]
[im 1/25]
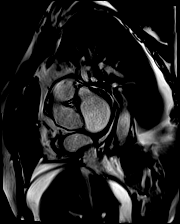

[Series 9: bSSFP · oblique · 8.0mm · 1.61mm/px · 1 of 25 slices shown (3 of 28)]
[im 1/25]
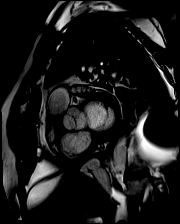

[Series 9: bSSFP · oblique · 8.0mm · 1.61mm/px · 1 of 25 slices shown (4 of 28)]
[im 1/25]
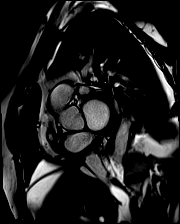

[Series 10: bSSFP · oblique · 8.0mm · 1.61mm/px · 1 of 25 slices shown (5 of 28)]
[im 1/25]
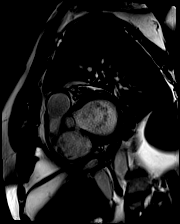

[Series 10: bSSFP · oblique · 8.0mm · 1.61mm/px · 1 of 25 slices shown (6 of 28)]
[im 1/25]
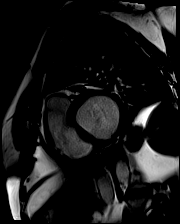

[Series 11: bSSFP · oblique · 8.0mm · 1.61mm/px · 1 of 25 slices shown (7 of 28)]
[im 1/25]
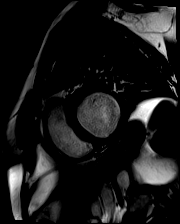

[Series 11: bSSFP · oblique · 8.0mm · 1.61mm/px · 1 of 25 slices shown (8 of 28)]
[im 1/25]
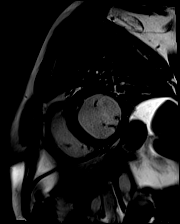

[Series 12: bSSFP · oblique · 8.0mm · 1.61mm/px · 1 of 25 slices shown (9 of 28)]
[im 1/25]
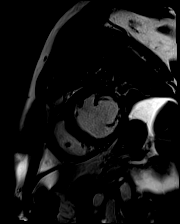

[Series 12: bSSFP · oblique · 8.0mm · 1.61mm/px · 1 of 25 slices shown (10 of 28)]
[im 1/25]
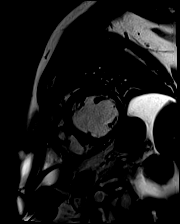

[Series 13: bSSFP · oblique · 8.0mm · 1.61mm/px · 1 of 25 slices shown (11 of 28)]
[im 1/25]
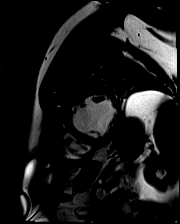

[Series 13: bSSFP · oblique · 8.0mm · 1.61mm/px · 1 of 25 slices shown (12 of 28)]
[im 1/25]
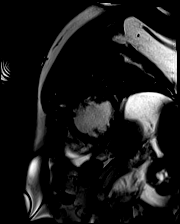

[Series 14: bSSFP · oblique · 8.0mm · 1.61mm/px · 1 of 25 slices shown (13 of 28)]
[im 1/25]
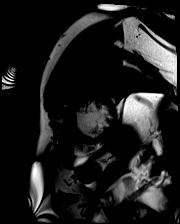

[Series 14: bSSFP · oblique · 8.0mm · 1.61mm/px · 1 of 25 slices shown (14 of 28)]
[im 1/25]
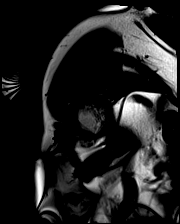

[Series 15: bSSFP · oblique · 8.0mm · 1.61mm/px · 1 of 25 slices shown (15 of 28)]
[im 1/25]
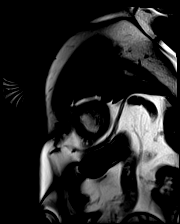

[Series 15: bSSFP · oblique · 8.0mm · 1.61mm/px · 1 of 25 slices shown (16 of 28)]
[im 1/25]
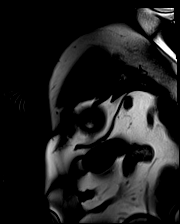

[Series 16: bSSFP · oblique · 8.0mm · 1.61mm/px · 1 of 25 slices shown (17 of 28)]
[im 1/25]
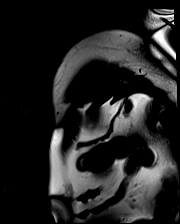

[Series 16: bSSFP · oblique · 8.0mm · 1.61mm/px · 1 of 25 slices shown (18 of 28)]
[im 1/25]
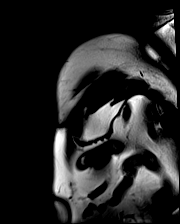

[Series 17: bSSFP · oblique · 8.0mm · 1.61mm/px · 1 of 25 slices shown (19 of 28)]
[im 1/25]
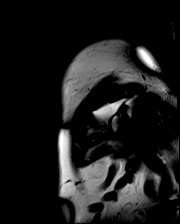

[Series 17: bSSFP · oblique · 8.0mm · 1.61mm/px · 1 of 25 slices shown (20 of 28)]
[im 1/25]
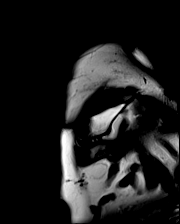

[Series 19: (id)_long_t1 · coronal · 8.0mm · 1.41mm/px · 1 of 24 slices shown]
[im 1/24]
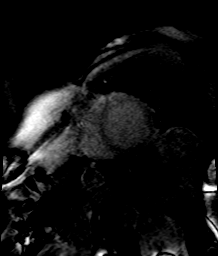

[Series 20: (id)_long_t1_moco · coronal · 8.0mm · 1.41mm/px · 1 of 24 slices shown]
[im 1/24]
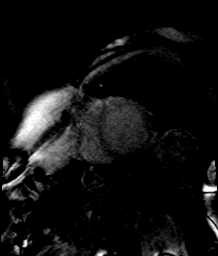

[Series 24: bSSFP · oblique · 8.0mm · 1.61mm/px · 1 of 25 slices shown (21 of 28)]
[im 1/25]
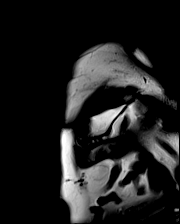

[Series 24: bSSFP · oblique · 8.0mm · 1.61mm/px · 1 of 25 slices shown (22 of 28)]
[im 1/25]
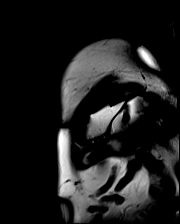

[Series 25: bSSFP · oblique · 8.0mm · 1.61mm/px · 1 of 25 slices shown (23 of 28)]
[im 1/25]
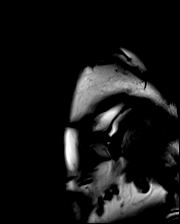

[Series 25: bSSFP · oblique · 8.0mm · 1.61mm/px · 1 of 25 slices shown (24 of 28)]
[im 1/25]
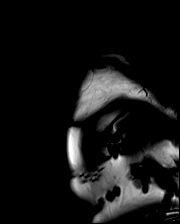

[Series 26: (id)_trufi · coronal · 8.0mm · 1.88mm/px · 1 of 9 slices shown]
[im 1/9]
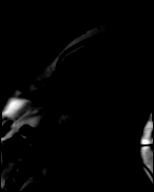

[Series 27: (id)_trufi_moco · coronal · 8.0mm · 1.88mm/px · 1 of 9 slices shown]
[im 1/9]
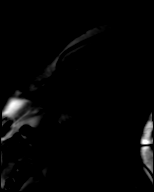

[Series 30: bSSFP · axial · 6.0mm · 1.41mm/px · 1 of 25 slices shown (25 of 28)]
[im 1/25]
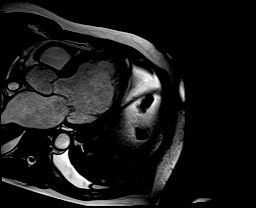

[Series 31: bSSFP · oblique · 6.0mm · 1.41mm/px · 1 of 25 slices shown (26 of 28)]
[im 1/25]
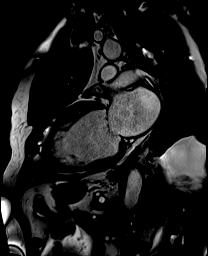

[Series 33: STIR · oblique · 8.0mm · 1.73mm/px · 1 of 17 slices shown]
[im 1/17]
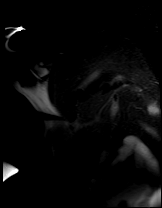

[Series 34: bSSFP · axial · 6.0mm · 1.41mm/px · 1 of 25 slices shown (27 of 28)]
[im 1/25]
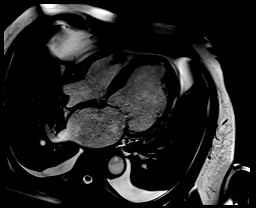

[Series 35: bSSFP · coronal · 6.0mm · 1.41mm/px · 1 of 25 slices shown (28 of 28)]
[im 1/25]
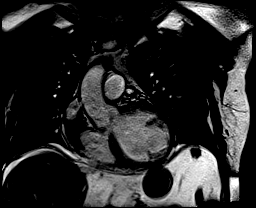

[Series 36: cine rvit · oblique · 6.0mm · 1.41mm/px · 1 of 25 slices shown]
[im 1/25]
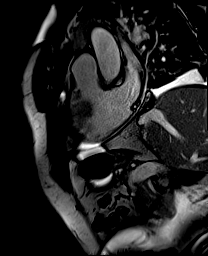

[Series 37: aortic valve cine · oblique · 6.0mm · 1.41mm/px · 1 of 25 slices shown]
[im 1/25]
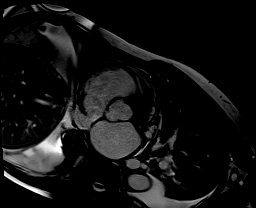

[Series 38: cine rvot · sagittal · 6.0mm · 1.41mm/px · 1 of 25 slices shown]
[im 1/25]
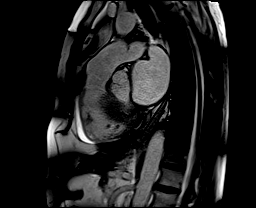

[Series 41: lge_single shot sa · oblique · 8.0mm · 1.98mm/px · 1 of 12 slices shown]
[im 1/12]
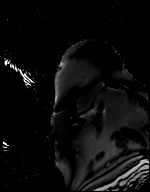

[Series 44: lge_single shot 4 · axial · 6.0mm · 1.98mm/px · 1 of 1 slices shown (1 of 2)]
[im 1/1]
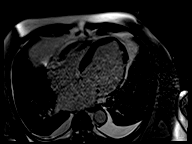

[Series 45: lge_single shot 4 · axial · 6.0mm · 1.98mm/px · 1 of 1 slices shown (2 of 2)]
[im 1/1]
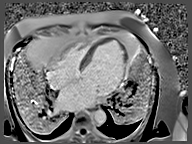

[Series 46: lge_single shot 3 · axial · 6.0mm · 1.98mm/px · 1 of 1 slices shown (1 of 2)]
[im 1/1]
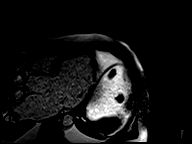

[Series 47: lge_single shot 3 · axial · 6.0mm · 1.98mm/px · 1 of 1 slices shown (2 of 2)]
[im 1/1]
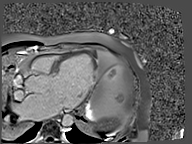

[Series 48: (id)_short_t1 · coronal · 8.0mm · 1.41mm/px · 1 of 27 slices shown]
[im 1/27]
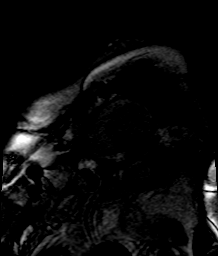

[Series 49: (id)_short_t1_moco · coronal · 8.0mm · 1.41mm/px · 1 of 27 slices shown]
[im 1/27]
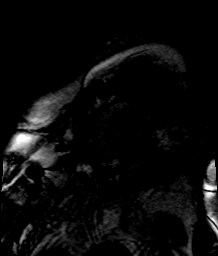

[Series 50: (id)_short_t1_moco_t1 · coronal · 8.0mm · 1.41mm/px · 1 of 6 slices shown]
[im 1/6]
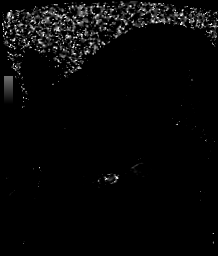

[45 of 48 positions shown; findings below may reference images not displayed]

FINDINGS: Moderate LAE Normal RA. Mild RVE with mildly decreased function.
Possible PFO. Trivial pericardial effusion Normal AV. Trivial MR.
Normal TV with mild TR

Severe LVE with diffuse hypokinesis. The posterior lateral wall is
thinned and severely hypokinetic as is the inferior apex. The
circumflex distribution posterior wall

Appears thinned and less viable with [DATE] [REDACTED] thickness gadolinium
uptake There is subendocardial uptake in the inferior wall but the
inferior apex is thinned and severely hypokinetic

Quantitative LVEF 26% (EDV 279, ESV 205 SV 74 cc)

Quantitative RVEF 38% (EDV 113 cc ESV 98 cc SV 61 cc)
IMPRESSION: 1. Severe LVE with EF 26%. The circumflex distribution appears less
viable with near full thickness scar and thinning of the posterior
lateral wall. The Inferior apex in the PLB/PDA territory is also
thinned and severely hypokinetic with [DATE] thickness gadolinium
uptake

2.  Mild RVE with mild hypokinesis RVEF 38%

3.  Moderate LAE

4.  Trivial pericardial effusion

5.  Possible PFO

Zeinab Tiger

## 2021-09-20 DIAGNOSIS — L570 Actinic keratosis: Secondary | ICD-10-CM | POA: Diagnosis not present

## 2021-09-20 NOTE — Progress Notes (Incomplete)
***In Progress*** ? ?  ?Advanced Heart Failure Clinic Note  ?Referring Physician: ?PCP: Monico Blitz, MD ?HF Cardiology: Dr. [name]  ? ?HPI:  ?66 y.o. male w/ h/o HCV s/p liver transplant in 2009 (followed at [facility]), Stage III CKD, HTN and T2DM.   He was admitted 12/2019 w/ NSTEMI and Afib w/ RVR, found to have severe 3VD and biventricular heart failure, LVEF 20-25%, RV moderately reduced. Underwent CABG + MAZE + LA appendage clipping 12/19/19. Required milrinone post operatively. Post op course complicated by ATN/ renal failure. Required CVVHD but remained anuric. Tunneled HD cath placed and transitioned to iHD, MWF.  He was able to transition off HD as an outpatient.  ?  ?He had repeat echo done in 03/2020 with EF 20-25%, moderate LV dilation, mild-moderately decreased RV systolic function, moderate MR.  ?  ?Echo in 09/2020 showed EF 30%, mid anterior and inferior severe hypokinesis, basal to mid anterolateral and inferolateral akinesis, mildly decreased RV systolic function.  ?  ?Returned 08/26/21 for followup of CHF and CAD.  He was in NSR. He was only taking carvedilol 3.125 mg bid, not sure why dose was less than prior.  He was not taking statin, not sure why.  No exertional dyspnea or chest pain.  Reported working out at gym most days of the week. No lightheadedness.  No palpitations.  Weight was up 4 lbs.  He had trouble with hyperkalemia.   ? ?Today he returns to HF clinic for pharmacist medication titration. At last visit with MD carvedilol was increased to 625 mg daily and spironolactone was decreased to 12.5 mg daily due to hyperkalemia.  ? ?Shortness of breath/dyspnea on exertion? {YES NO:22349}  ?Orthopnea/PND? {YES NO:22349} ?Edema? {YES NO:22349} ?Lightheadedness/dizziness? {YES NO:22349} ?Daily weights at home? {YES NO:22349} ?Blood pressure/heart rate monitoring at home? {YES NO:22349} ?Following low-sodium/fluid-restricted diet? {YES NO:22349} ? ?HF Medications: ? ? ?Has the patient been experiencing  any side effects to the medications prescribed?  {YES NO:22349} ? ?Does the patient have any problems obtaining medications due to transportation or finances?   {YES NO:22349} ? ?Understanding of regimen: {excellent/good/fair/poor:19665} ?Understanding of indications: {excellent/good/fair/poor:19665} ?Potential of compliance: {excellent/good/fair/poor:19665} ?Patient understands to avoid NSAIDs. ?Patient understands to avoid decongestants. ?  ? ?Pertinent Lab Values: ?08/26/21: Serum creatinine 1.9, BUN 30, Potassium 5.8, Sodium 137 ?Vital Signs: ?Weight: *** (last clinic weight: 160 lbs) ?Blood pressure: *** 122/70 ?Heart rate: *** 69 ? ?Assessment/Plan: ?1. CAD: NSTEMI in 12/2019.  Suspect out of hospital inferolateral MI.  Cardiac MRI showed that the inferolateral wall was likely not viable, but the remainder of the LV myocardium was likely viable. He had CABG 12/19/19 with LIMA-LAD, RIMA-ramus, seq SVG-PD/PL, SVG-D.  No chest pain.  ?- No ASA given Eliquis use.  ?- Continue atorvastatin 80 mg qhs.  ? ?2. Chronic Systolic CHF: Ischemic cardiomyopathy.  Echo 12/2019 with EF 20-25%, moderately decreased RV systolic function. Cardiac MRI with LV EF 26%, RV EF 38%, near full thickness scar in inferolateral wall suggesting lack of viability in the LCx territory.  Now post-CABG.  Echo in 03/2020 showed EF 20-25%, moderate LV dilation, mild-moderately decreased RV systolic function, moderate MR.  Echo in 09/2020 showed EF 30%, mid anterior and inferior severe hypokinesis, basal to mid anterolateral and inferolateral akinesis, mildly decreased RV systolic function.  Doing well currently, NYHA class I-II symptoms.  He is not volume overloaded.    ?- Continue carvedilol 6.25 mg bid.     ?- Continue Entresto 24/26 mg bid.  ?- He  does not appear to need Lasix.    ?- Continue Farxiga 10 mg daily.  ?- Continue spironolactone 25 mg daily but need to check BMET today with recent hyperkalemia (may have to cut back).  ?- He has decided  that he does not want an ICD.  He understands why Dr.McLean has asked him to consider having one put in.  He will let Dr. [name] know if he changes his mind.  ?- Repeat echo at followup in 2 months.  ? ?3. Atrial fibrillation:  S/p Maze + LAA clipping on 12/2014.  He is off amiodarone. ?- Continue Eliquis 5 mg bid.  ? ?4. CKD: Stage 3.  He required HD after CABG but renal function recovered. Follow BMET closely.  ? ?5. Liver transplant for HCV: Followed at [facility]. On Tacrolimus.  ? ?Follow up *** in 2 months for ECHO. ? ? ?Audry Riles, PharmD, BCPS, BCCP, CPP ?Heart Failure Clinic Pharmacist ?[phone] ? ? ?

## 2021-09-21 ENCOUNTER — Inpatient Hospital Stay (HOSPITAL_COMMUNITY): Admission: RE | Admit: 2021-09-21 | Payer: Medicare HMO | Source: Ambulatory Visit

## 2021-09-26 IMAGING — DX DG CHEST 1V
1 series · 1 of 1 positions shown · non-contrast
Comparison: 12/24/2019

CLINICAL DATA: Chest pain following cardiac surgery

EXAM:
CHEST  1 VIEW

[chest ap]
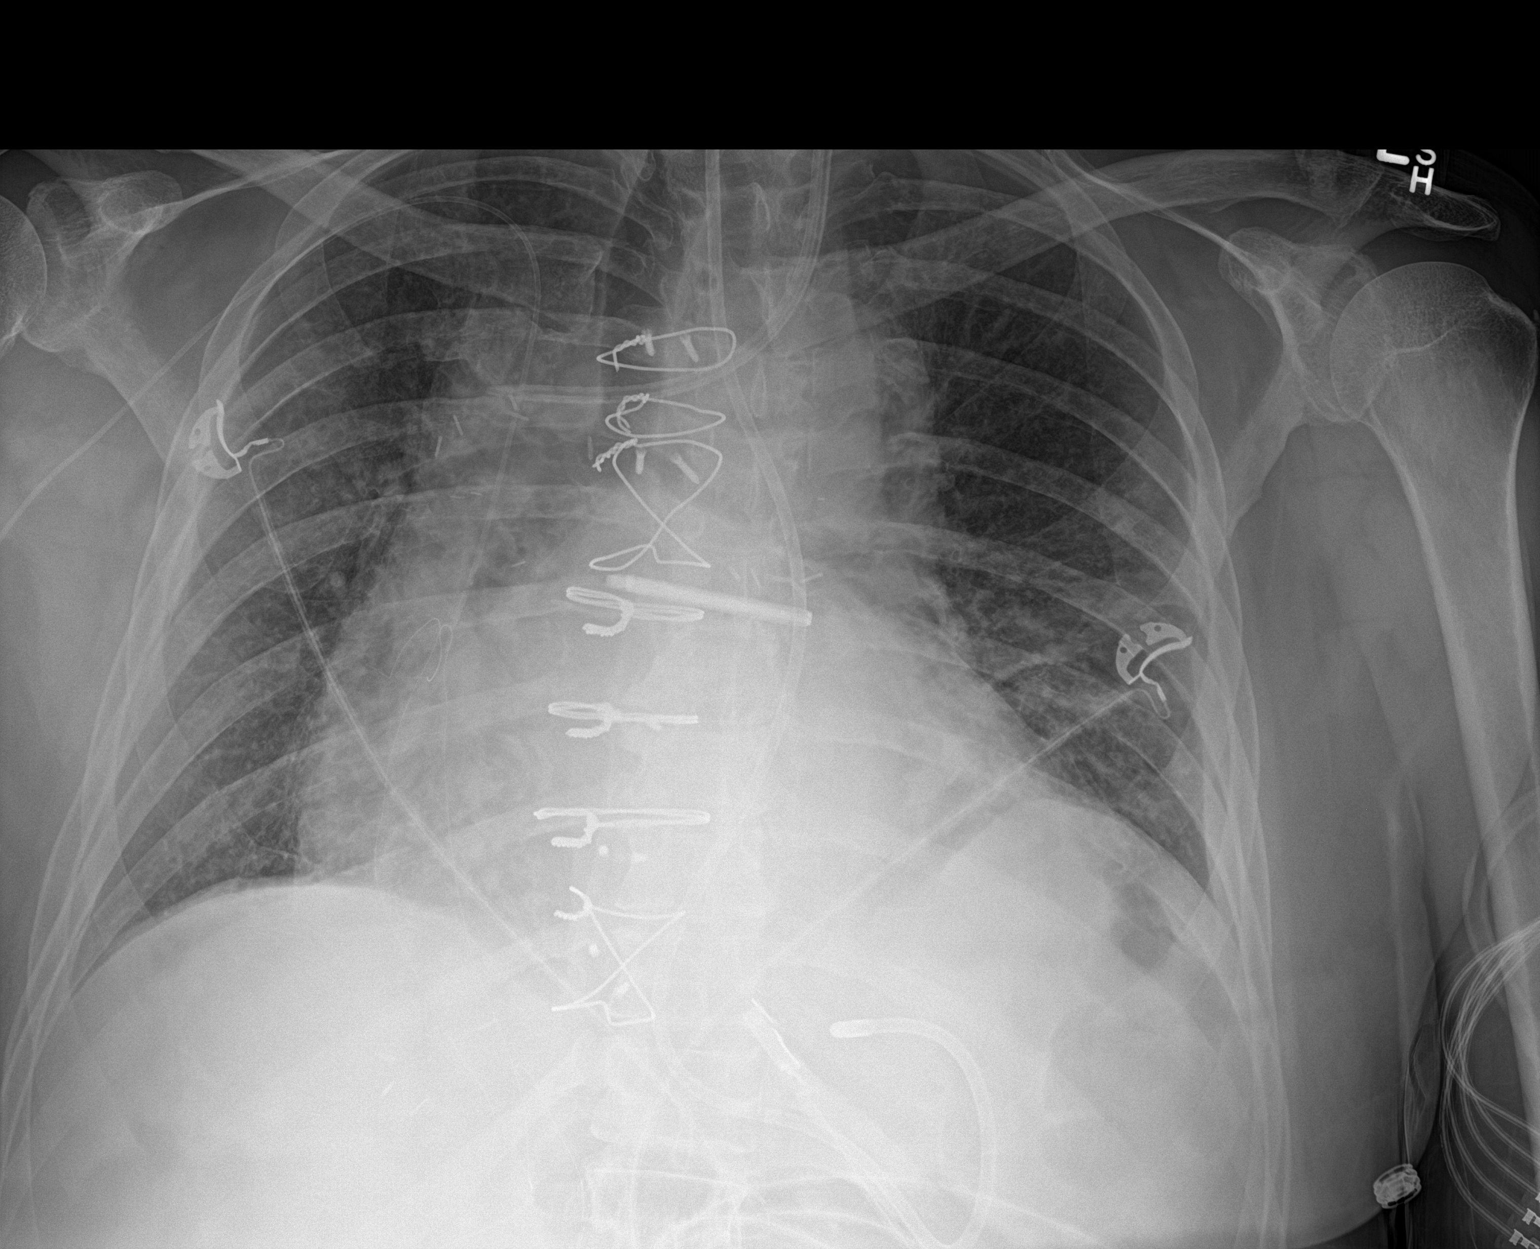

[1 of 1 positions shown; findings below may reference images not displayed]

FINDINGS: Cardiac shadow is enlarged. Postsurgical changes are again seen.
Feeding catheter is noted coiled within the stomach. Left jugular
central line and right-sided PICC line are noted and stable. Right
jugular sheath appears to have been removed. The lungs are well
aerated bilaterally. Minimal left basilar atelectasis is again
noted.
IMPRESSION: Minimal left basilar atelectasis.

Tubes and lines as described.

## 2021-09-28 ENCOUNTER — Encounter (HOSPITAL_COMMUNITY): Payer: Self-pay

## 2021-09-28 ENCOUNTER — Other Ambulatory Visit (HOSPITAL_COMMUNITY): Payer: Self-pay | Admitting: Cardiology

## 2021-09-29 ENCOUNTER — Other Ambulatory Visit (HOSPITAL_COMMUNITY): Payer: Self-pay | Admitting: *Deleted

## 2021-09-29 DIAGNOSIS — Z299 Encounter for prophylactic measures, unspecified: Secondary | ICD-10-CM | POA: Diagnosis not present

## 2021-09-29 DIAGNOSIS — Z87891 Personal history of nicotine dependence: Secondary | ICD-10-CM | POA: Diagnosis not present

## 2021-09-29 DIAGNOSIS — Z0000 Encounter for general adult medical examination without abnormal findings: Secondary | ICD-10-CM | POA: Diagnosis not present

## 2021-09-29 DIAGNOSIS — E7800 Pure hypercholesterolemia, unspecified: Secondary | ICD-10-CM | POA: Diagnosis not present

## 2021-09-29 DIAGNOSIS — R5383 Other fatigue: Secondary | ICD-10-CM | POA: Diagnosis not present

## 2021-09-29 DIAGNOSIS — Z6824 Body mass index (BMI) 24.0-24.9, adult: Secondary | ICD-10-CM | POA: Diagnosis not present

## 2021-09-29 DIAGNOSIS — I509 Heart failure, unspecified: Secondary | ICD-10-CM | POA: Diagnosis not present

## 2021-09-29 NOTE — Progress Notes (Incomplete)
***In Progress*** ? ?Referring Physician: ?PCP: Monico Blitz, MD ?HF Cardiology: Dr. [name]  ?  ?HPI:  ?66 y.o. male w/ h/o HCV s/p liver transplant in 2009 (followed at [facility]), Stage III CKD, HTN and T2DM.   He was admitted 12/2019 w/ NSTEMI and Afib w/ RVR, found to have severe 3VD and biventricular heart failure, LVEF 20-25%, RV moderately reduced. Underwent CABG + MAZE + LA appendage clipping 12/19/19. Required milrinone post operatively. Post op course complicated by ATN/ renal failure. Required CVVHD but remained anuric. Tunneled HD cath placed and transitioned to iHD, MWF.  He was able to transition off HD as an outpatient.  ?  ?He had repeat echo done in 03/2020 with EF 20-25%, moderate LV dilation, mild-moderately decreased RV systolic function, moderate MR.  ?  ?Echo in 09/2020 showed EF 30%, mid anterior and inferior severe hypokinesis, basal to mid anterolateral and inferolateral akinesis, mildly decreased RV systolic function.  ?  ?Returned 08/26/21 for followup of CHF and CAD.  He was in NSR. He was only taking carvedilol 3.125 mg bid, not sure why dose was less than prior.  He was not taking statin, not sure why.  No exertional dyspnea or chest pain.  Reported working out at gym most days of the week. No lightheadedness.  No palpitations.  Weight was up 4 lbs.  He had trouble with hyperkalemia.   ?  ?Today he returns to HF clinic for pharmacist medication titration. At last visit with MD carvedilol was increased to 6.25 mg BID and spironolactone was decreased to 12.5 mg daily due to hyperkalemia.  ?  ?Shortness of breath/dyspnea on exertion? {YES NO:22349}  ?Orthopnea/PND? {YES NO:22349} ?Edema? {YES NO:22349} ?Lightheadedness/dizziness? {YES NO:22349} ?Daily weights at home? {YES NO:22349} ?Blood pressure/heart rate monitoring at home? {YES NO:22349} ?Following low-sodium/fluid-restricted diet? {YES NO:22349} ?  ?HF Medications: ?Carvedilol 6.25 mg BID ?Entresto 24/26 mg BID ?Spironolactone 12.5 mg  daily ?Farxiga 10 mg daily ?  ?Has the patient been experiencing any side effects to the medications prescribed?  {YES NO:22349} ?  ?Does the patient have any problems obtaining medications due to transportation or finances?   [facility], Did he get Liz's email? ?  ?Understanding of regimen: {excellent/good/fair/poor:19665} ?Understanding of indications: {excellent/good/fair/poor:19665} ?Potential of compliance: {excellent/good/fair/poor:19665} ?Patient understands to avoid NSAIDs. ?Patient understands to avoid decongestants. ?  ?  ?Pertinent Lab Values: ?08/26/21: Serum creatinine 1.9, BUN 30, Potassium 5.8, Sodium 137 (can't see labs from [facility] [place]? Did he get them?) ?Vital Signs: ?Weight: *** (last clinic weight: 160 lbs) ?Blood pressure: *** 122/70 ?Heart rate: *** 69 ?  ?Assessment/Plan: ?1. CAD: NSTEMI in 12/2019.  Suspect out of hospital inferolateral MI.  Cardiac MRI showed that the inferolateral wall was likely not viable, but the remainder of the LV myocardium was likely viable. He had CABG 12/19/19 with LIMA-LAD, RIMA-ramus, seq SVG-PD/PL, SVG-D.  No chest pain.  ?- No ASA given Eliquis use.  ?- Continue atorvastatin 80 mg qhs.  ?  ?2. Chronic Systolic CHF: Ischemic cardiomyopathy.  Echo 12/2019 with EF 20-25%, moderately decreased RV systolic function. Cardiac MRI with LV EF 26%, RV EF 38%, near full thickness scar in inferolateral wall suggesting lack of viability in the LCx territory.  Now post-CABG.  Echo in 03/2020 showed EF 20-25%, moderate LV dilation, mild-moderately decreased RV systolic function, moderate MR.  Echo in 09/2020 showed EF 30%, mid anterior and inferior severe hypokinesis, basal to mid anterolateral and inferolateral akinesis, mildly decreased RV systolic function.  Doing well currently,  NYHA class I-II symptoms.  He is not volume overloaded.    ?- Continue carvedilol 6.25 mg bid.     ?- Continue Entresto 24/26 mg bid.  ?- He does not appear to need Lasix.    ?- Continue  Farxiga 10 mg daily.  ?- Continue spironolactone 12.5 mg daily. ?- He has decided that he does not want an ICD.  He understands why Dr.McLean has asked him to consider having one put in.  He will let Dr. [name] know if he changes his mind.  ?- Repeat echo at followup in 2 months.  ?  ?3. Atrial fibrillation:  S/p Maze + LAA clipping on 12/2014.  He is off amiodarone. ?- Continue Eliquis 5 mg bid.  ?  ?4. CKD: Stage 3.  He required HD after CABG but renal function recovered. Follow BMET closely.  ?  ?5. Liver transplant for HCV: Followed at [facility]. On Tacrolimus.  ?  ?Follow up *** in 2 months for ECHO. ?  ?  ?Audry Riles, PharmD, BCPS, BCCP, CPP ?Heart Failure ?

## 2021-10-10 ENCOUNTER — Ambulatory Visit (HOSPITAL_COMMUNITY): Payer: Medicare HMO

## 2021-10-12 ENCOUNTER — Ambulatory Visit (HOSPITAL_COMMUNITY)
Admission: RE | Admit: 2021-10-12 | Discharge: 2021-10-12 | Disposition: A | Discharge status: Home / Self Care | Payer: Medicare HMO | Source: Ambulatory Visit | Attending: Cardiology | Admitting: Cardiology

## 2021-10-12 ENCOUNTER — Encounter (HOSPITAL_COMMUNITY): Payer: Self-pay

## 2021-10-12 ENCOUNTER — Telehealth (HOSPITAL_COMMUNITY): Payer: Self-pay | Admitting: *Deleted

## 2021-10-12 VITALS — BP 124/76 | HR 63 | Ht 70.0 in | Wt 160.0 lb

## 2021-10-12 DIAGNOSIS — Z944 Liver transplant status: Secondary | ICD-10-CM | POA: Diagnosis not present

## 2021-10-12 DIAGNOSIS — Z7984 Long term (current) use of oral hypoglycemic drugs: Secondary | ICD-10-CM | POA: Diagnosis not present

## 2021-10-12 DIAGNOSIS — I255 Ischemic cardiomyopathy: Secondary | ICD-10-CM | POA: Diagnosis not present

## 2021-10-12 DIAGNOSIS — I5022 Chronic systolic (congestive) heart failure: Secondary | ICD-10-CM | POA: Insufficient documentation

## 2021-10-12 DIAGNOSIS — Z79899 Other long term (current) drug therapy: Secondary | ICD-10-CM | POA: Diagnosis not present

## 2021-10-12 DIAGNOSIS — N183 Chronic kidney disease, stage 3 unspecified: Secondary | ICD-10-CM | POA: Diagnosis not present

## 2021-10-12 DIAGNOSIS — Z951 Presence of aortocoronary bypass graft: Secondary | ICD-10-CM | POA: Diagnosis not present

## 2021-10-12 DIAGNOSIS — E875 Hyperkalemia: Secondary | ICD-10-CM | POA: Insufficient documentation

## 2021-10-12 DIAGNOSIS — I130 Hypertensive heart and chronic kidney disease with heart failure and stage 1 through stage 4 chronic kidney disease, or unspecified chronic kidney disease: Secondary | ICD-10-CM | POA: Diagnosis not present

## 2021-10-12 DIAGNOSIS — I252 Old myocardial infarction: Secondary | ICD-10-CM | POA: Insufficient documentation

## 2021-10-12 DIAGNOSIS — I4891 Unspecified atrial fibrillation: Secondary | ICD-10-CM | POA: Diagnosis not present

## 2021-10-12 DIAGNOSIS — I2510 Atherosclerotic heart disease of native coronary artery without angina pectoris: Secondary | ICD-10-CM | POA: Insufficient documentation

## 2021-10-12 DIAGNOSIS — I5021 Acute systolic (congestive) heart failure: Secondary | ICD-10-CM

## 2021-10-12 DIAGNOSIS — Z7901 Long term (current) use of anticoagulants: Secondary | ICD-10-CM | POA: Insufficient documentation

## 2021-10-12 LAB — BASIC METABOLIC PANEL
Anion gap: 4 — ABNORMAL LOW (ref 5–15)
BUN: 31 mg/dL — ABNORMAL HIGH (ref 8–23)
CO2: 23 mmol/L (ref 22–32)
Calcium: 8.9 mg/dL (ref 8.9–10.3)
Chloride: 110 mmol/L (ref 98–111)
Creatinine, Ser: 2.02 mg/dL — ABNORMAL HIGH (ref 0.61–1.24)
GFR, Estimated: 36 mL/min — ABNORMAL LOW (ref >60)
Glucose, Bld: 98 mg/dL (ref 70–99)
Potassium: 6.4 mmol/L (ref 3.5–5.1)
Sodium: 137 mmol/L (ref 135–145)

## 2021-10-12 MED ORDER — SPIRONOLACTONE 25 MG PO TABS: 12.5000 mg | ORAL_TABLET | Freq: Every day | ORAL | 0 refills | Status: DC

## 2021-10-12 MED ORDER — LOKELMA 5 G PO PACK: 10.0000 g | PACK | Freq: Once | ORAL | 0 refills | Status: AC

## 2021-10-12 NOTE — Telephone Encounter (Signed)
Scarlette Calico, RN  ?10/12/2021  5:35 PM EDT Back to Top  ?  ?Unable to reach pt, have left several messages, mychart message also sent, called pt's son, emergency contact, he is at work but will also try to reach pt.  ? Kerry Dory, CMA  ?10/12/2021  4:44 PM EDT   ?  ?Patient called.  Left message for patient to call back.Phone:  ?[phone] (M)  ? Larey Dresser, MD  ?10/12/2021  3:20 PM EDT   ?  ?Stop spironolactone.  Hold next dose of Entresto.  Take Lokelma 10 g x 1 today then start Lokelma 5 g daily.   Needs BMET Friday.   ? ?

## 2021-10-12 NOTE — Progress Notes (Signed)
Referring Physician: ?PCP: Monico Blitz, MD ?HF Cardiology: Dr. [name]  ?  ?HPI:  ?66 y.o. male w/ h/o HCV s/p liver transplant in 2009 (followed at [facility]), Stage III CKD, HTN and T2DM.   He was admitted 12/2019 w/ NSTEMI and Afib w/ RVR, found to have severe 3VD and biventricular heart failure, LVEF 20-25%, RV moderately reduced. Underwent CABG + MAZE + LA appendage clipping 12/19/19. Required milrinone post operatively. Post op course complicated by ATN/ renal failure. Required CVVHD but remained anuric. Tunneled HD cath placed and transitioned to iHD, MWF.  He was able to transition off HD as an outpatient.  ?  ?He had repeat echo done in 03/2020 with EF 20-25%, moderate LV dilation, mild-moderately decreased RV systolic function, moderate MR.  ?  ?Echo in 09/2020 showed EF 30%, mid anterior and inferior severe hypokinesis, basal to mid anterolateral and inferolateral akinesis, mildly decreased RV systolic function.  ?  ?Returned 08/26/21 for followup of CHF and CAD.  He was in NSR. He was only taking carvedilol 3.125 mg bid, not sure why dose was less than prior.  He was not taking statin, not sure why.  No exertional dyspnea or chest pain.  Reported working out at gym most days of the week. No lightheadedness.  No palpitations.  Weight was up 4 lbs.  He had trouble with hyperkalemia.   ?  ?Today he returns to HF clinic for pharmacist medication titration.  At last visit with MD, carvedilol was increased to 6.25 mg BID and spironolactone was decreased to 12.5 mg daily due to hyperkalemia.  Of note, he doesn't remember being told to reduce his spironolactone dose and brought his prescription bottle filled 04/27 with instructions to take spironolactone 25 mg one tablet once daily. He confirms he has still been taking spironolactone 1 tablet daily. Today, he says that he feels great - no dizziness, lightheadedness, chest pain or palpitations.  No SOB/DOE.  Works at [employer] four times a week and does cardio  three times a week.  Adheres to sodium restriction and drinks ~2L of water a day.  No LEE, PND, or orthopnea.  Hasn't been on a diuretic for some time now.  Endorses adherence to medications, sometimes misses an evening dose but only once a month.  Home weights are consistently in 160s.  BP today 124/76 after taking all of his morning doses. Says his systolic at home is in the 120s but hasn't taken his blood pressure in a while since he's been well controlled.  ? ?BMET today returned with significantly elevated potassium at 6.4. SCr 2.02 also elevated from March clinic visit.  ?  ?HF Medications: ?Carvedilol 6.25 mg BID ?Entresto 24/26 mg BID ?Spironolactone 25 mg daily (was reduced at 04/27 clinic visit to 12.5 mg daily, but taking 25 mg daily) ?Farxiga 10 mg daily ?  ?Has the patient been experiencing any side effects to the medications prescribed?  no ?  ?Does the patient have any problems obtaining medications due to transportation or finances?   [facility], received confirmation from Kathlee Nations that he was awarded [employer]. ?  ?Understanding of regimen: excellent ?Understanding of indications: good ?Potential of compliance: good ?Patient understands to avoid NSAIDs. ?Patient understands to avoid decongestants. ?  ?  ?Pertinent Lab Values: ?10/12/2021: Serum creatinine 2.02, BUN 31, Potassium 6.4, Sodium 137 ? ?Vital Signs: ?Weight: 160 lbs (last clinic weight: 160 lbs) ?Blood pressure: 124/76 ?Heart rate: 63 ?  ?Assessment/Plan: ?1. CAD: NSTEMI in 12/2019.  Suspect out of hospital inferolateral MI.  Cardiac MRI showed that the inferolateral wall was likely not viable, but the remainder of the LV myocardium was likely viable. He had CABG 12/19/19 with LIMA-LAD, RIMA-ramus, seq SVG-PD/PL, SVG-D.  No chest pain.  ?- No ASA given Eliquis use.  ?- Continue atorvastatin 80 mg qhs.  ?  ?2. Chronic Systolic CHF: Ischemic cardiomyopathy.  Echo 12/2019 with EF 20-25%, moderately decreased RV systolic function. Cardiac MRI  with LV EF 26%, RV EF 38%, near full thickness scar in inferolateral wall suggesting lack of viability in the LCx territory.  Now post-CABG.  Echo in 03/2020 showed EF 20-25%, moderate LV dilation, mild-moderately decreased RV systolic function, moderate MR.  Echo in 09/2020 showed EF 30%, mid anterior and inferior severe hypokinesis, basal to mid anterolateral and inferolateral akinesis, mildly decreased RV systolic function.  Doing well currently, NYHA class I-II symptoms.  He is not volume overloaded.    ?- Continue carvedilol 6.25 mg bid.     ?- Hold Entresto 24/26 mg bid until repeat labs later this week with hyperkalemia  ?- He does not appear to need Lasix. ?- Continue Farxiga 10 mg daily.  ?- Stop spironolactone with hyperkalemia  ?- Start Lokelma 10 g x 1 ?- Repeat BMET to recheck K after holding Entresto/spironolactone in 2 days ?- He has decided that he does not want an ICD.  He understands why Dr.McLean has asked him to consider having one put in.  He will let Dr. [name] know if he changes his mind.  ?- Repeat echo at followup in 2 months.  ?  ?3. Atrial fibrillation:  S/p Maze + LAA clipping on 12/2014.  He is off amiodarone. ?- Continue Eliquis 5 mg bid.  ?- Provided with sample today in clinic ?  ?4. CKD: Stage 3.  He required HD after CABG but renal function recovered. Follow BMET closely.  ?  ?5. Liver transplant for HCV: Followed at [facility]. On Tacrolimus.  ?  ?Follow up on 05/12 for repeat BMP in [place], [place]. Pending potassium level, may restart Entresto 24/26 mg twice daily. ? ?Follow up in 1 month with PharmD and in 2 months for ECHO. ?  ?Laurey Arrow, PharmD ?PGY1 Pharmacy Resident ? ?Kerby Nora, PharmD, BCPS ?Advanced Heart Failure Clinic Pharmacist ?[phone] ? ?

## 2021-10-12 NOTE — Patient Instructions (Signed)
It was a pleasure seeing you today! ? ?MEDICATIONS: ?-We are changing your medications today ?-Decrease spironolactone 12.5 mg (one half tablet) daily.  ?-Call if you have questions about your medications. ?a ?LABS: ?-We will call you if your labs need attention. ? ?NEXT APPOINTMENT: ?Return to clinic in June 7th at 11:00 with Audry Riles, PharmD. ? ?In general, to take care of your heart failure: ?-Limit your fluid intake to 2 Liters (half-gallon) per day.   ?-Limit your salt intake to ideally 2-3 grams (2000-3000 mg) per day. ?-Weigh yourself daily and record, and bring that "weight diary" to your next appointment.  (Weight gain of 2-3 pounds in 1 day typically means fluid weight.) ?-The medications for your heart are to help your heart and help you live longer.   ?-Please contact us before stopping any of your heart medications. ? ?Call the clinic at [phone] with questions or to reschedule future appointments. ? ?

## 2021-10-12 NOTE — Telephone Encounter (Signed)
-----   Message from Larey Dresser, MD sent at 10/12/2021  3:20 PM EDT ----- ?Stop spironolactone.  Hold next dose of Entresto.  Take Lokelma 10 g x 1 today then start Lokelma 5 g daily.   Needs BMET Friday.  ?

## 2021-10-13 NOTE — Telephone Encounter (Signed)
Addressed by pharmacy team, they spoke w/pt and sch him to go to [facility] Friday for repeat labs, order faxed to them at [phone] ?  ?Hold Entresto 24/26 mg bid until repeat labs later this week with hyperkalemia  ?- He does not appear to need Lasix. ?- Continue Farxiga 10 mg daily.  ?- Stop spironolactone with hyperkalemia  ?- Start Lokelma 10 g x 1 ?- Repeat BMET to recheck K after holding Entresto/spironolactone in 2 days ?

## 2021-10-14 ENCOUNTER — Other Ambulatory Visit (HOSPITAL_COMMUNITY): Payer: Medicare HMO

## 2021-10-14 ENCOUNTER — Telehealth (HOSPITAL_COMMUNITY): Payer: Self-pay

## 2021-10-14 DIAGNOSIS — E875 Hyperkalemia: Secondary | ICD-10-CM | POA: Diagnosis not present

## 2021-10-14 NOTE — Telephone Encounter (Signed)
Received notification that K is 5.2 and SCr 2.07. Called patient  and left message instructing to hold Entresto x 1 more day. Resume Sunday AM. Instructed to stay off spironolactone. ?

## 2021-10-14 NOTE — Telephone Encounter (Signed)
Attempted to contact patient to remind him to get BMET drawn today at [facility]. Left detailed message and instructions to call back. ? ?Will attempt to call again later today if he does not return call. ?

## 2021-10-21 DIAGNOSIS — Z87891 Personal history of nicotine dependence: Secondary | ICD-10-CM | POA: Diagnosis not present

## 2021-10-21 DIAGNOSIS — N1832 Chronic kidney disease, stage 3b: Secondary | ICD-10-CM | POA: Diagnosis not present

## 2021-10-21 DIAGNOSIS — J811 Chronic pulmonary edema: Secondary | ICD-10-CM | POA: Diagnosis not present

## 2021-10-21 DIAGNOSIS — E875 Hyperkalemia: Secondary | ICD-10-CM | POA: Diagnosis not present

## 2021-10-21 DIAGNOSIS — R69 Illness, unspecified: Secondary | ICD-10-CM | POA: Diagnosis not present

## 2021-10-21 DIAGNOSIS — R0602 Shortness of breath: Secondary | ICD-10-CM | POA: Diagnosis not present

## 2021-10-21 DIAGNOSIS — Z299 Encounter for prophylactic measures, unspecified: Secondary | ICD-10-CM | POA: Diagnosis not present

## 2021-10-21 DIAGNOSIS — I5021 Acute systolic (congestive) heart failure: Secondary | ICD-10-CM | POA: Diagnosis not present

## 2021-10-21 DIAGNOSIS — N183 Chronic kidney disease, stage 3 unspecified: Secondary | ICD-10-CM | POA: Diagnosis not present

## 2021-10-25 DIAGNOSIS — I10 Essential (primary) hypertension: Secondary | ICD-10-CM | POA: Diagnosis not present

## 2021-10-25 DIAGNOSIS — J069 Acute upper respiratory infection, unspecified: Secondary | ICD-10-CM | POA: Diagnosis not present

## 2021-10-25 DIAGNOSIS — G4700 Insomnia, unspecified: Secondary | ICD-10-CM | POA: Diagnosis not present

## 2021-10-25 DIAGNOSIS — R69 Illness, unspecified: Secondary | ICD-10-CM | POA: Diagnosis not present

## 2021-10-25 DIAGNOSIS — Z299 Encounter for prophylactic measures, unspecified: Secondary | ICD-10-CM | POA: Diagnosis not present

## 2021-10-25 DIAGNOSIS — Z789 Other specified health status: Secondary | ICD-10-CM | POA: Diagnosis not present

## 2021-10-26 NOTE — Progress Notes (Incomplete)
***In Progress*** Referring Physician: PCP: Monico Blitz, MD HF Cardiology: Dr. [name]    HPI:  66 y.o. male w/ h/o HCV s/p liver transplant in 2009 (followed at [facility]), Stage III CKD, HTN and T2DM.   He was admitted 12/2019 w/ NSTEMI and Afib w/ RVR, found to have severe 3VD and biventricular heart failure, LVEF 20-25%, RV moderately reduced. Underwent CABG + MAZE + LA appendage clipping 12/19/19. Required milrinone post operatively. Post op course complicated by ATN/ renal failure. Required CVVHD but remained anuric. Tunneled HD cath placed and transitioned to iHD, MWF.  He was able to transition off HD as an outpatient.    He had repeat echo done in 03/2020 with EF 20-25%, moderate LV dilation, mild-moderately decreased RV systolic function, moderate MR.    Echo in 09/2020 showed EF 30%, mid anterior and inferior severe hypokinesis, basal to mid anterolateral and inferolateral akinesis, mildly decreased RV systolic function.    Returned 08/26/21 for followup of CHF and CAD.  He was in NSR. He was only taking carvedilol 3.125 mg bid, not sure why dose was less than prior.  He was not taking statin, not sure why.  No exertional dyspnea or chest pain.  Reported working out at gym most days of the week. No lightheadedness.  No palpitations.  Weight was up 4 lbs.  He had trouble with hyperkalemia.     Returned to HF clinic for pharmacist medication titration 10/12/21.  At previous visit with MD, carvedilol was increased to 6.25 mg BID and spironolactone was decreased to 12.5 mg daily due to hyperkalemia.  Of note, he did not remember being told to reduce his spironolactone dose and brought his prescription bottle filled 09/29/21 with instructions to take spironolactone 25 mg one tablet once daily. He confirmed he had still been taking spironolactone 1 tablet daily. He stated that he felt great - no dizziness, lightheadedness, chest pain or palpitations.  No SOB/DOE.  Works at [employer] - stated he lifts weights  four times a week and does cardio three times a week. Stated that he adheres to sodium restriction and drinks ~2L of water a day.  No LEE, PND, or orthopnea.  Had not been on a loop diuretic for some time now.  Endorsed adherence to medications, but noted he sometimes misses an evening dose (~ only once a month).  Stated home weights were consistently in 160s.  BP in clinic was 124/76 after taking all of his morning doses. Stated his systolic at home tended to run in the 120s but he had not taken his blood pressure in a while since he's been well controlled.   Today he returns to HF clinic for pharmacist medication titration.  At last visit with pharmacy clinic, spironolactone was discontinued due to hyperkalemia.    HF Medications: Carvedilol 6.25 mg BID Entresto 24/26 mg BID Farxiga 10 mg daily   Has the patient been experiencing any side effects to the medications prescribed?  no   Does the patient have any problems obtaining medications due to transportation or finances?   [facility], received confirmation from Kathlee Nations that he was awarded [employer].   Understanding of regimen: excellent Understanding of indications: good Potential of compliance: good Patient understands to avoid NSAIDs. Patient understands to avoid decongestants.     Pertinent Lab Values: 10/12/2021: Serum creatinine 2.02, BUN 31, Potassium 6.4, Sodium 137 10/14/21: K 5.2, Scr 2.07 BMET today pending  Vital Signs: *** Weight: 160 lbs (last clinic weight: 160 lbs) Blood  pressure: 124/76 Heart rate: 63   Assessment/Plan: 1. CAD: NSTEMI in 12/2019.  Suspect out of hospital inferolateral MI.  Cardiac MRI showed that the inferolateral wall was likely not viable, but the remainder of the LV myocardium was likely viable. He had CABG 12/19/19 with LIMA-LAD, RIMA-ramus, seq SVG-PD/PL, SVG-D.  No chest pain.  - No ASA given Eliquis use.  - Continue atorvastatin 80 mg qhs.    2. Chronic Systolic CHF: Ischemic  cardiomyopathy.  Echo 12/2019 with EF 20-25%, moderately decreased RV systolic function. Cardiac MRI with LV EF 26%, RV EF 38%, near full thickness scar in inferolateral wall suggesting lack of viability in the LCx territory.  Now post-CABG.  Echo in 03/2020 showed EF 20-25%, moderate LV dilation, mild-moderately decreased RV systolic function, moderate MR.  Echo in 09/2020 showed EF 30%, mid anterior and inferior severe hypokinesis, basal to mid anterolateral and inferolateral akinesis, mildly decreased RV systolic function.   - Doing well currently, NYHA class I-II symptoms.  He is not volume overloaded.    - He does not appear to need Lasix. - Continue carvedilol 6.25 mg BID.     - Continue Entresto 24/26 mg BID  - Continue Farxiga 10 mg daily.  - Spironolactone recently discontinued due to hyperkalemia.  - He has decided that he does not want an ICD.  He understands why Dr.McLean has asked him to consider having one put in.  He will let Dr. [name] know if he changes his mind.  - Repeat echo at followup in 2 months.    3. Atrial fibrillation:  S/p Maze + LAA clipping on 12/2014.  He is off amiodarone. - Continue Eliquis 5 mg BID.    4. CKD: Stage 3.  He required HD after CABG but renal function recovered. Follow BMET closely.    5. Liver transplant for HCV: Followed at [facility]. On Tacrolimus.    Follow up ***    Audry Riles, PharmD, BCPS, BCCP, CPP Heart Failure Clinic Pharmacist [phone]

## 2021-11-09 ENCOUNTER — Inpatient Hospital Stay (HOSPITAL_COMMUNITY): Admission: RE | Admit: 2021-11-09 | Payer: Medicare HMO | Source: Ambulatory Visit

## 2021-11-15 NOTE — Progress Notes (Incomplete)
***In Progress*** Referring Physician: PCP: Monico Blitz, MD HF Cardiology: Dr. [name]    HPI:  66 y.o. male w/ h/o HCV s/p liver transplant in 2009 (followed at [facility]), Stage III CKD, HTN and T2DM.   He was admitted 12/2019 w/ NSTEMI and Afib w/ RVR, found to have severe 3VD and biventricular heart failure, LVEF 20-25%, RV moderately reduced. Underwent CABG + MAZE + LA appendage clipping 12/19/19. Required milrinone post operatively. Post op course complicated by ATN/ renal failure. Required CVVHD but remained anuric. Tunneled HD cath placed and transitioned to iHD, MWF.  He was able to transition off HD as an outpatient.    He had repeat echo done in 03/2020 with EF 20-25%, moderate LV dilation, mild-moderately decreased RV systolic function, moderate MR.    Echo in 09/2020 showed EF 30%, mid anterior and inferior severe hypokinesis, basal to mid anterolateral and inferolateral akinesis, mildly decreased RV systolic function.    Returned 08/26/21 for followup of CHF and CAD.  He was in NSR. He was only taking carvedilol 3.125 mg bid, not sure why dose was less than prior.  He was not taking statin, not sure why.  No exertional dyspnea or chest pain.  Reported working out at gym most days of the week. No lightheadedness.  No palpitations.  Weight was up 4 lbs.  He had trouble with hyperkalemia.     Returned to HF clinic for pharmacist medication titration 10/12/21.  At previous visit with MD, carvedilol was increased to 6.25 mg BID and spironolactone was decreased to 12.5 mg daily due to hyperkalemia.  Of note, he did not remember being told to reduce his spironolactone dose and brought his prescription bottle filled 09/29/21 with instructions to take spironolactone 25 mg one tablet once daily. He confirmed he had still been taking spironolactone 1 tablet daily. He stated that he felt great - no dizziness, lightheadedness, chest pain or palpitations.  No SOB/DOE.  Works at [employer] - stated he lifts weights  four times a week and does cardio three times a week. Stated that he adheres to sodium restriction and drinks ~2L of water a day.  No LEE, PND, or orthopnea.  Had not been on a loop diuretic for some time now.  Endorsed adherence to medications, but noted he sometimes misses an evening dose (~ only once a month).  Stated home weights were consistently in 160s.  BP in clinic was 124/76 after taking all of his morning doses. Stated his systolic at home tended to run in the 120s but he had not taken his blood pressure in a while since he's been well controlled.   Today he returns to HF clinic for pharmacist medication titration.  At last visit with pharmacy clinic, spironolactone was discontinued due to hyperkalemia.    HF Medications: Carvedilol 6.25 mg BID Entresto 24/26 mg BID Farxiga 10 mg daily   Has the patient been experiencing any side effects to the medications prescribed?  no   Does the patient have any problems obtaining medications due to transportation or finances?   [facility], received confirmation from Kathlee Nations that he was awarded [employer].   Understanding of regimen: excellent Understanding of indications: good Potential of compliance: good Patient understands to avoid NSAIDs. Patient understands to avoid decongestants.     Pertinent Lab Values: 10/12/2021: Serum creatinine 2.02, BUN 31, Potassium 6.4, Sodium 137 10/14/21: K 5.2, Scr 2.07 BMET today pending  Vital Signs: *** Weight: 160 lbs (last clinic weight: 160 lbs) Blood  pressure: 124/76 Heart rate: 63   Assessment/Plan: 1. CAD: NSTEMI in 12/2019.  Suspect out of hospital inferolateral MI.  Cardiac MRI showed that the inferolateral wall was likely not viable, but the remainder of the LV myocardium was likely viable. He had CABG 12/19/19 with LIMA-LAD, RIMA-ramus, seq SVG-PD/PL, SVG-D.  No chest pain.  - No ASA given Eliquis use.  - Continue atorvastatin 80 mg qhs.    2. Chronic Systolic CHF: Ischemic  cardiomyopathy.  Echo 12/2019 with EF 20-25%, moderately decreased RV systolic function. Cardiac MRI with LV EF 26%, RV EF 38%, near full thickness scar in inferolateral wall suggesting lack of viability in the LCx territory.  Now post-CABG.  Echo in 03/2020 showed EF 20-25%, moderate LV dilation, mild-moderately decreased RV systolic function, moderate MR.  Echo in 09/2020 showed EF 30%, mid anterior and inferior severe hypokinesis, basal to mid anterolateral and inferolateral akinesis, mildly decreased RV systolic function.   - Doing well currently, NYHA class I-II symptoms.  He is not volume overloaded.    - He does not appear to need Lasix. - Continue carvedilol 6.25 mg BID.     - Continue Entresto 24/26 mg BID  - Continue Farxiga 10 mg daily.  - Spironolactone recently discontinued due to hyperkalemia.  - He has decided that he does not want an ICD.  He understands why Dr.McLean has asked him to consider having one put in.  He will let Dr. [name] know if he changes his mind.  - Repeat echo at followup in 2 months.    3. Atrial fibrillation:  S/p Maze + LAA clipping on 12/2014.  He is off amiodarone. - Continue Eliquis 5 mg BID.    4. CKD: Stage 3.  He required HD after CABG but renal function recovered. Follow BMET closely.    5. Liver transplant for HCV: Followed at [facility]. On Tacrolimus.    Follow up ***    Audry Riles, PharmD, BCPS, BCCP, CPP Heart Failure Clinic Pharmacist [phone]

## 2021-11-16 DIAGNOSIS — N1832 Chronic kidney disease, stage 3b: Secondary | ICD-10-CM | POA: Diagnosis not present

## 2021-11-16 DIAGNOSIS — I5022 Chronic systolic (congestive) heart failure: Secondary | ICD-10-CM | POA: Diagnosis not present

## 2021-11-16 DIAGNOSIS — D638 Anemia in other chronic diseases classified elsewhere: Secondary | ICD-10-CM | POA: Diagnosis not present

## 2021-11-16 DIAGNOSIS — E875 Hyperkalemia: Secondary | ICD-10-CM | POA: Diagnosis not present

## 2021-11-16 DIAGNOSIS — R809 Proteinuria, unspecified: Secondary | ICD-10-CM | POA: Diagnosis not present

## 2021-11-16 DIAGNOSIS — I9589 Other hypotension: Secondary | ICD-10-CM | POA: Diagnosis not present

## 2021-11-16 DIAGNOSIS — E211 Secondary hyperparathyroidism, not elsewhere classified: Secondary | ICD-10-CM | POA: Diagnosis not present

## 2021-11-22 DIAGNOSIS — M47812 Spondylosis without myelopathy or radiculopathy, cervical region: Secondary | ICD-10-CM | POA: Diagnosis not present

## 2021-11-22 DIAGNOSIS — M19011 Primary osteoarthritis, right shoulder: Secondary | ICD-10-CM | POA: Diagnosis not present

## 2021-11-22 DIAGNOSIS — M9901 Segmental and somatic dysfunction of cervical region: Secondary | ICD-10-CM | POA: Diagnosis not present

## 2021-11-29 ENCOUNTER — Ambulatory Visit (HOSPITAL_COMMUNITY)
Admission: RE | Admit: 2021-11-29 | Discharge: 2021-11-29 | Disposition: A | Discharge status: Home / Self Care | Payer: Medicare HMO | Source: Ambulatory Visit | Attending: Cardiology | Admitting: Cardiology

## 2021-11-29 ENCOUNTER — Telehealth (HOSPITAL_COMMUNITY): Payer: Self-pay | Admitting: Pharmacist

## 2021-11-29 VITALS — BP 122/74 | HR 68 | Wt 154.8 lb

## 2021-11-29 DIAGNOSIS — N183 Chronic kidney disease, stage 3 unspecified: Secondary | ICD-10-CM | POA: Diagnosis not present

## 2021-11-29 DIAGNOSIS — I5022 Chronic systolic (congestive) heart failure: Secondary | ICD-10-CM | POA: Diagnosis not present

## 2021-11-29 DIAGNOSIS — I2510 Atherosclerotic heart disease of native coronary artery without angina pectoris: Secondary | ICD-10-CM | POA: Diagnosis not present

## 2021-11-29 DIAGNOSIS — I4891 Unspecified atrial fibrillation: Secondary | ICD-10-CM | POA: Insufficient documentation

## 2021-11-29 DIAGNOSIS — Z7984 Long term (current) use of oral hypoglycemic drugs: Secondary | ICD-10-CM | POA: Insufficient documentation

## 2021-11-29 DIAGNOSIS — Z79899 Other long term (current) drug therapy: Secondary | ICD-10-CM | POA: Diagnosis not present

## 2021-11-29 DIAGNOSIS — Z944 Liver transplant status: Secondary | ICD-10-CM | POA: Diagnosis not present

## 2021-11-29 DIAGNOSIS — E1122 Type 2 diabetes mellitus with diabetic chronic kidney disease: Secondary | ICD-10-CM | POA: Diagnosis not present

## 2021-11-29 DIAGNOSIS — Z951 Presence of aortocoronary bypass graft: Secondary | ICD-10-CM | POA: Insufficient documentation

## 2021-11-29 DIAGNOSIS — Z7901 Long term (current) use of anticoagulants: Secondary | ICD-10-CM | POA: Diagnosis not present

## 2021-11-29 DIAGNOSIS — I130 Hypertensive heart and chronic kidney disease with heart failure and stage 1 through stage 4 chronic kidney disease, or unspecified chronic kidney disease: Secondary | ICD-10-CM | POA: Insufficient documentation

## 2021-11-29 DIAGNOSIS — I252 Old myocardial infarction: Secondary | ICD-10-CM | POA: Insufficient documentation

## 2021-11-29 LAB — BASIC METABOLIC PANEL
Anion gap: 5 (ref 5–15)
BUN: 30 mg/dL — ABNORMAL HIGH (ref 8–23)
CO2: 22 mmol/L (ref 22–32)
Calcium: 8.9 mg/dL (ref 8.9–10.3)
Chloride: 113 mmol/L — ABNORMAL HIGH (ref 98–111)
Creatinine, Ser: 2.12 mg/dL — ABNORMAL HIGH (ref 0.61–1.24)
GFR, Estimated: 34 mL/min — ABNORMAL LOW (ref >60)
Glucose, Bld: 90 mg/dL (ref 70–99)
Potassium: 5.2 mmol/L — ABNORMAL HIGH (ref 3.5–5.1)
Sodium: 140 mmol/L (ref 135–145)

## 2021-11-29 MED ORDER — CARVEDILOL 6.25 MG PO TABS: 9.3750 mg | ORAL_TABLET | Freq: Two times a day (BID) | ORAL | 11 refills | Status: DC

## 2021-11-29 MED ORDER — SILDENAFIL CITRATE 50 MG PO TABS: ORAL_TABLET | ORAL | 0 refills | Status: DC

## 2021-12-13 DIAGNOSIS — D849 Immunodeficiency, unspecified: Secondary | ICD-10-CM | POA: Diagnosis not present

## 2021-12-13 DIAGNOSIS — B998 Other infectious disease: Secondary | ICD-10-CM | POA: Diagnosis not present

## 2021-12-28 ENCOUNTER — Encounter (HOSPITAL_COMMUNITY): Payer: Self-pay

## 2021-12-28 DIAGNOSIS — Z789 Other specified health status: Secondary | ICD-10-CM | POA: Diagnosis not present

## 2021-12-28 DIAGNOSIS — R69 Illness, unspecified: Secondary | ICD-10-CM | POA: Diagnosis not present

## 2021-12-28 DIAGNOSIS — I10 Essential (primary) hypertension: Secondary | ICD-10-CM | POA: Diagnosis not present

## 2021-12-28 DIAGNOSIS — Z299 Encounter for prophylactic measures, unspecified: Secondary | ICD-10-CM | POA: Diagnosis not present

## 2021-12-28 DIAGNOSIS — I482 Chronic atrial fibrillation, unspecified: Secondary | ICD-10-CM | POA: Diagnosis not present

## 2022-01-03 DIAGNOSIS — M5030 Other cervical disc degeneration, unspecified cervical region: Secondary | ICD-10-CM | POA: Diagnosis not present

## 2022-01-03 DIAGNOSIS — M542 Cervicalgia: Secondary | ICD-10-CM | POA: Diagnosis not present

## 2022-01-03 DIAGNOSIS — M19012 Primary osteoarthritis, left shoulder: Secondary | ICD-10-CM | POA: Diagnosis not present

## 2022-01-03 DIAGNOSIS — M25512 Pain in left shoulder: Secondary | ICD-10-CM | POA: Diagnosis not present

## 2022-01-05 ENCOUNTER — Other Ambulatory Visit (HOSPITAL_COMMUNITY): Payer: Self-pay | Admitting: Cardiology

## 2022-01-06 ENCOUNTER — Other Ambulatory Visit (HOSPITAL_COMMUNITY): Payer: Self-pay

## 2022-01-06 MED ORDER — CARVEDILOL 6.25 MG PO TABS: 9.3750 mg | ORAL_TABLET | Freq: Two times a day (BID) | ORAL | 11 refills | Status: DC

## 2022-01-10 DIAGNOSIS — D485 Neoplasm of uncertain behavior of skin: Secondary | ICD-10-CM | POA: Diagnosis not present

## 2022-01-10 DIAGNOSIS — L570 Actinic keratosis: Secondary | ICD-10-CM | POA: Diagnosis not present

## 2022-01-16 ENCOUNTER — Encounter (HOSPITAL_COMMUNITY): Payer: Medicare HMO

## 2022-01-23 DIAGNOSIS — I482 Chronic atrial fibrillation, unspecified: Secondary | ICD-10-CM | POA: Diagnosis not present

## 2022-01-23 DIAGNOSIS — I10 Essential (primary) hypertension: Secondary | ICD-10-CM | POA: Diagnosis not present

## 2022-01-23 DIAGNOSIS — Z299 Encounter for prophylactic measures, unspecified: Secondary | ICD-10-CM | POA: Diagnosis not present

## 2022-01-23 DIAGNOSIS — M25512 Pain in left shoulder: Secondary | ICD-10-CM | POA: Diagnosis not present

## 2022-01-23 DIAGNOSIS — Z789 Other specified health status: Secondary | ICD-10-CM | POA: Diagnosis not present

## 2022-01-23 DIAGNOSIS — Z6825 Body mass index (BMI) 25.0-25.9, adult: Secondary | ICD-10-CM | POA: Diagnosis not present

## 2022-01-23 DIAGNOSIS — R69 Illness, unspecified: Secondary | ICD-10-CM | POA: Diagnosis not present

## 2022-02-02 DIAGNOSIS — C44529 Squamous cell carcinoma of skin of other part of trunk: Secondary | ICD-10-CM | POA: Diagnosis not present

## 2022-02-02 DIAGNOSIS — C44509 Unspecified malignant neoplasm of skin of other part of trunk: Secondary | ICD-10-CM | POA: Diagnosis not present

## 2022-02-07 DIAGNOSIS — S20219A Contusion of unspecified front wall of thorax, initial encounter: Secondary | ICD-10-CM | POA: Diagnosis not present

## 2022-02-13 ENCOUNTER — Encounter (HOSPITAL_COMMUNITY): Payer: Self-pay

## 2022-02-13 ENCOUNTER — Ambulatory Visit (HOSPITAL_COMMUNITY)
Admission: RE | Admit: 2022-02-13 | Discharge: 2022-02-13 | Disposition: A | Discharge status: Home / Self Care | Payer: Medicare HMO | Source: Ambulatory Visit | Attending: Family Medicine | Admitting: Family Medicine

## 2022-02-13 VITALS — BP 120/56 | HR 58 | Ht 71.0 in | Wt 154.0 lb

## 2022-02-13 DIAGNOSIS — I4891 Unspecified atrial fibrillation: Secondary | ICD-10-CM

## 2022-02-13 DIAGNOSIS — I480 Paroxysmal atrial fibrillation: Secondary | ICD-10-CM | POA: Diagnosis not present

## 2022-02-13 DIAGNOSIS — Z944 Liver transplant status: Secondary | ICD-10-CM

## 2022-02-13 DIAGNOSIS — I2510 Atherosclerotic heart disease of native coronary artery without angina pectoris: Secondary | ICD-10-CM | POA: Diagnosis not present

## 2022-02-13 DIAGNOSIS — E785 Hyperlipidemia, unspecified: Secondary | ICD-10-CM | POA: Diagnosis not present

## 2022-02-13 DIAGNOSIS — Z79899 Other long term (current) drug therapy: Secondary | ICD-10-CM | POA: Insufficient documentation

## 2022-02-13 DIAGNOSIS — E1122 Type 2 diabetes mellitus with diabetic chronic kidney disease: Secondary | ICD-10-CM | POA: Diagnosis not present

## 2022-02-13 DIAGNOSIS — Z7901 Long term (current) use of anticoagulants: Secondary | ICD-10-CM | POA: Insufficient documentation

## 2022-02-13 DIAGNOSIS — I5022 Chronic systolic (congestive) heart failure: Secondary | ICD-10-CM

## 2022-02-13 DIAGNOSIS — Z7984 Long term (current) use of oral hypoglycemic drugs: Secondary | ICD-10-CM | POA: Diagnosis not present

## 2022-02-13 DIAGNOSIS — I255 Ischemic cardiomyopathy: Secondary | ICD-10-CM | POA: Insufficient documentation

## 2022-02-13 DIAGNOSIS — I454 Nonspecific intraventricular block: Secondary | ICD-10-CM | POA: Diagnosis not present

## 2022-02-13 DIAGNOSIS — Z885 Allergy status to narcotic agent status: Secondary | ICD-10-CM | POA: Diagnosis not present

## 2022-02-13 DIAGNOSIS — I252 Old myocardial infarction: Secondary | ICD-10-CM | POA: Insufficient documentation

## 2022-02-13 DIAGNOSIS — Z8639 Personal history of other endocrine, nutritional and metabolic disease: Secondary | ICD-10-CM | POA: Insufficient documentation

## 2022-02-13 DIAGNOSIS — Z833 Family history of diabetes mellitus: Secondary | ICD-10-CM | POA: Diagnosis not present

## 2022-02-13 DIAGNOSIS — N183 Chronic kidney disease, stage 3 unspecified: Secondary | ICD-10-CM | POA: Diagnosis not present

## 2022-02-13 DIAGNOSIS — N1831 Chronic kidney disease, stage 3a: Secondary | ICD-10-CM

## 2022-02-13 DIAGNOSIS — Z8249 Family history of ischemic heart disease and other diseases of the circulatory system: Secondary | ICD-10-CM | POA: Insufficient documentation

## 2022-02-13 DIAGNOSIS — I130 Hypertensive heart and chronic kidney disease with heart failure and stage 1 through stage 4 chronic kidney disease, or unspecified chronic kidney disease: Secondary | ICD-10-CM | POA: Insufficient documentation

## 2022-02-13 DIAGNOSIS — Z87891 Personal history of nicotine dependence: Secondary | ICD-10-CM | POA: Diagnosis not present

## 2022-02-13 DIAGNOSIS — Z951 Presence of aortocoronary bypass graft: Secondary | ICD-10-CM | POA: Insufficient documentation

## 2022-02-13 DIAGNOSIS — Z79621 Long term (current) use of calcineurin inhibitor: Secondary | ICD-10-CM | POA: Diagnosis not present

## 2022-02-13 LAB — BASIC METABOLIC PANEL
Anion gap: 6 (ref 5–15)
BUN: 34 mg/dL — ABNORMAL HIGH (ref 8–23)
CO2: 20 mmol/L — ABNORMAL LOW (ref 22–32)
Calcium: 8.5 mg/dL — ABNORMAL LOW (ref 8.9–10.3)
Chloride: 109 mmol/L (ref 98–111)
Creatinine, Ser: 2.35 mg/dL — ABNORMAL HIGH (ref 0.61–1.24)
GFR, Estimated: 30 mL/min — ABNORMAL LOW (ref >60)
Glucose, Bld: 106 mg/dL — ABNORMAL HIGH (ref 70–99)
Potassium: 5.1 mmol/L (ref 3.5–5.1)
Sodium: 135 mmol/L (ref 135–145)

## 2022-02-13 NOTE — Progress Notes (Signed)
Advanced Heart Failure Clinic Note   PCP: Monico Blitz, MD HF Cardiology: Dr. [name]   HPI: 66 y.o. male w/ h/o HCV s/p liver transplant in 2009 (followed at [facility]), Stage III CKD, HTN and T2DM.   He was admitted 7/21 w/ NSTEMI and Afib w/ RVR, found to have severe 3VD and biventricular heart failure, LVEF 20-25%, RV moderately reduced. Underwent CABG + MAZE + LA appendage clipping 12/19/19. Required milrinone post operatively. Post op course complicated by ATN/ renal failure. Required CVVHD but remained anuric. Tunneled HD cath placed and transitioned to iHD, MWF.  He was able to transition off HD as an outpatient.   Repeat echo done in 10/21 with EF 20-25%, moderate LV dilation, mild-moderately decreased RV systolic function, moderate MR.   Echo in 4/22 showed EF 30%, mid anterior and inferior severe hypokinesis, basal to mid anterolateral and inferolateral akinesis, mildly decreased RV systolic function.   Today he returns for HF follow up. Overall feeling fine. He works PT at [employer] and works out most days during the week. He does not get markedly short of breath with working out. Denies palpitations, CP, dizziness, edema, or PND/Orthopnea. Appetite poor. No fever or chills. Weight at home 155 pounds. Taking all medications. He has not needed Lasix. Wants to gain weight but findings it difficult.  ECG (personally reviewed): NSR 59 bpm, anterolateral TWIs.   Labs (9/21): LDL 66, HDL 44 Labs (10/21): K 3.9, creatinine 2.02 Labs (12/21): K 3.7, creatinine 1.58 Labs (9/21): LDL 66 Labs (1/22): K 4.1, creatinine 1.6 Labs (3/22): K 4.6, creatinine 1.56 => 2.09 Labs (4/22): LDL 66 Labs (8/22): K 5.5, creatinine 1.92 Labs (2/23): K 6.2, creatinine 2.36 Labs (6/23): K 5.2, creatinine 2.12  Review of systems complete and found to be negative unless listed in HPI.    PMH: 1. CKD stage III: He was on HD after CABG in 7/21 but renal function recovered.  2. HCV with cirrhosis, s/p liver  transplant in 2009.  3. HTN 4. Psoriasis 5. Type 2 diabetes 6. Hyperlipidemia 7. CAD: NSTEMI 7/21.  - CABG (7/21) with LIMA-LAD, RIMA-ramus, sequential SVG-PDA and PLV, SVG -D 8. Atrial fibrillation: Paroxysmal.  - S/p Maze and LA appendage clip with CABG in 7/21.  9. Chronic systolic CHF: Ischemic cardiomyopathy.  - Echo (10/21): EF 20-25%, moderate LV dilation, mild-moderately decreased RV systolic function, moderate MR.  - Echo (4/22): EF 30%, mid anterior and inferior severe hypokinesis, basal to mid anterolateral and inferolateral akinesis, mildly decreased RV systolic function.  Current Outpatient Medications  Medication Sig Dispense Refill   Apremilast (OTEZLA) 30 MG TABS Take 1 tablet by mouth 2 (two) times daily.     atorvastatin (LIPITOR) 80 MG tablet Take 1 tablet (80 mg total) by mouth daily. 90 tablet 3   carvedilol (COREG) 6.25 MG tablet Take 1.5 tablets (9.375 mg total) by mouth 2 (two) times daily with a meal. 90 tablet 11   dapagliflozin propanediol (FARXIGA) 10 MG TABS tablet Take 1 tablet (10 mg total) by mouth daily before breakfast. 90 tablet 3   ELIQUIS 5 MG TABS tablet Take 1 tablet by mouth twice daily 60 tablet 6   FLUoxetine (PROZAC) 40 MG capsule Take 40 mg by mouth daily.     LORazepam (ATIVAN) 1 MG tablet Take 1 mg by mouth 2 (two) times daily as needed (panic attacks).      sacubitril-valsartan (ENTRESTO) 24-26 MG Take 1 tablet by mouth 2 (two) times daily. 180 tablet 3  sildenafil (VIAGRA) 50 MG tablet TAKE 1 TABLET BY MOUTH AS NEEDED FOR ERECTILE DYSFUNCTION 20 tablet 0   spironolactone (ALDACTONE) 25 MG tablet Take 12.5 mg by mouth daily.     tacrolimus (PROGRAF) 1 MG capsule Take 2 tabs by mouth every morning & 1 tab every evening     temazepam (RESTORIL) 30 MG capsule Take 30 mg by mouth at bedtime.     triamcinolone cream (KENALOG) 0.1 % Apply 1 application topically 2 (two) times daily.     No current facility-administered medications for this  encounter.   Allergies  Allergen Reactions   Morphine And Related Nausea And Vomiting   Social History   Socioeconomic History   Marital status: Widowed    Spouse name: Not on file   Number of children: Not on file   Years of education: Not on file   Highest education level: Not on file  Occupational History   Not on file  Tobacco Use   Smoking status: Former    Packs/day: 1.00    Types: Cigarettes    Quit date: 09/04/2007    Years since quitting: 14.4   Smokeless tobacco: Never  Vaping Use   Vaping Use: Never used  Substance and Sexual Activity   Alcohol use: No   Drug use: No   Sexual activity: Yes    Birth control/protection: None  Other Topics Concern   Not on file  Social History Narrative   Not on file   Social Determinants of Health   Financial Resource Strain: Not on file  Food Insecurity: No Food Insecurity (01/05/2020)   Hunger Vital Sign    Worried About Running Out of Food in the Last Year: Never true    [place] in the Last Year: Never true  Transportation Needs: No Transportation Needs (01/05/2020)   PRAPARE - Hydrologist (Medical): No    Lack of Transportation (Non-Medical): No  Physical Activity: Not on file  Stress: Not on file  Social Connections: Not on file  Intimate Partner Violence: Not on file   Family History  Problem Relation Age of Onset   Diabetes Mother    Heart disease Father    Heart disease Son    BP (!) 120/56   Pulse (!) 58   Ht '5\' 11"'$  (1.803 m)   Wt 69.9 kg (154 lb)   SpO2 98%   BMI 21.48 kg/m   Wt Readings from Last 3 Encounters:  02/13/22 69.9 kg (154 lb)  11/29/21 70.2 kg (154 lb 12.8 oz)  10/12/21 72.6 kg (160 lb)   PHYSICAL EXAM: General:  NAD. No resp difficulty, thin HEENT: Normal Neck: Supple. No JVD. Carotids 2+ bilat; no bruits. No lymphadenopathy or thryomegaly appreciated. Cor: PMI nondisplaced. Regular rate & rhythm. No rubs, gallops or murmurs. Lungs:  Clear Abdomen: Soft, nontender, nondistended. No hepatosplenomegaly. No bruits or masses. Good bowel sounds. Extremities: No cyanosis, clubbing, rash, edema Neuro: Alert & oriented x 3, cranial nerves grossly intact. Moves all 4 extremities w/o difficulty. Affect pleasant.   ASSESSMENT & PLAN: 1. CAD: NSTEMI in 7/21.  Suspect out of hospital inferolateral MI.  Cardiac MRI showed that the inferolateral wall was likely not viable, but the remainder of the LV myocardium was likely viable. He had CABG 12/19/19 with LIMA-LAD, RIMA-ramus, seq SVG-PD/PL, SVG-D.  No chest pain.  - No ASA given Eliquis use.  - Continue atorvastatin 80 mg qhs.  2. Chronic Systolic CHF: Ischemic cardiomyopathy.  Echo 7/21 with EF 20-25%, moderately decreased RV systolic function. Cardiac MRI with LV EF 26%, RV EF 38%, near full thickness scar in inferolateral wall suggesting lack of viability in the LCx territory.  Now post-CABG.  Echo in 10/21 showed EF 20-25%, moderate LV dilation, mild-moderately decreased RV systolic function, moderate MR.  Echo in 4/22 showed EF 30%, mid anterior and inferior severe hypokinesis, basal to mid anterolateral and inferolateral akinesis, mildly decreased RV systolic function.  Doing well, NYHA class I-II symptoms.  He is not volume overloaded.    - Continue Coreg 9.375 mg bid.     - Continue Entresto 24/26 bid. Will not increase today with hx of hyperkalemia. - Continue Farxiga 10 mg daily.  - Continue spironolactone 25 mg daily. BMET today (has had hyperkalemia in the past, may need to cut back). - He does not appear to need Lasix.    - He has decided that he does not want an ICD.  He understands why I have asked him to consider having one put in.  He will let me know if he changes his mind.  - Repeat echo. 3. Atrial fibrillation:  S/p Maze + LAA clipping on 7/16.  He is off amiodarone.  He is in NSR today.  - Continue Eliquis 5 mg bid. No bleeding issues. 4. CKD: Stage 3.  He required HD  after CABG but renal function recovered. Follow BMET closely.  5. Liver transplant for HCV: Followed at [facility] On Tacrolimus.   Follow up in 2-3 months with Dr. [name] + echo.  [place], FNP 02/13/22

## 2022-02-13 NOTE — Patient Instructions (Signed)
Thank you for coming in today  Labs were done today, if any labs are abnormal the clinic will call you No news is good news  Your physician recommends that you schedule a follow-up appointment in:  2-3 months with echocardiogram  Your physician has requested that you have an echocardiogram. Echocardiography is a painless test that uses sound waves to create images of your heart. It provides your doctor with information about the size and shape of your heart and how well your heart's chambers and valves are working. This procedure takes approximately one hour. There are no restrictions for this procedure.       Do the following things EVERYDAY: Weigh yourself in the morning before breakfast. Write it down and keep it in a log. Take your medicines as prescribed Eat low salt foods--Limit salt (sodium) to 2000 mg per day.  Stay as active as you can everyday Limit all fluids for the day to less than 2 liters  At the [facility], you and your health needs are our priority. As part of our continuing mission to provide you with exceptional heart care, we have created designated Provider Care Teams. These Care Teams include your primary Cardiologist (physician) and Advanced Practice Providers (APPs- Physician Assistants and Nurse Practitioners) who all work together to provide you with the care you need, when you need it.   You may see any of the following providers on your designated Care Team at your next follow up: Dr Glori Bickers Dr Loralie Champagne Dr. Roxana Hires, NP Lyda Jester, [place] [facility] [place], [place] Forestine Na, NP Audry Riles, PharmD   Please be sure to bring in all your medications bottles to every appointment.   If you have any questions or concerns before your next appointment please send [place] a message through [place] or call our office at [phone].    TO LEAVE A MESSAGE FOR THE NURSE SELECT OPTION 2, PLEASE LEAVE A  MESSAGE INCLUDING: YOUR NAME DATE OF BIRTH CALL BACK NUMBER REASON FOR CALL**this is important as we prioritize the call backs  YOU WILL RECEIVE A CALL BACK THE SAME DAY AS LONG AS YOU CALL BEFORE 4:00 PM

## 2022-02-14 NOTE — Addendum Note (Signed)
Encounter addended by: Rafael Bihari, FNP on: 02/14/2022 7:59 AM  Actions taken: Order Reconciliation Section accessed, Clinical Note Signed, Result note filed

## 2022-02-16 DIAGNOSIS — D485 Neoplasm of uncertain behavior of skin: Secondary | ICD-10-CM | POA: Diagnosis not present

## 2022-02-16 DIAGNOSIS — C44329 Squamous cell carcinoma of skin of other parts of face: Secondary | ICD-10-CM | POA: Diagnosis not present

## 2022-02-20 DIAGNOSIS — M75102 Unspecified rotator cuff tear or rupture of left shoulder, not specified as traumatic: Secondary | ICD-10-CM | POA: Diagnosis not present

## 2022-02-20 DIAGNOSIS — M47812 Spondylosis without myelopathy or radiculopathy, cervical region: Secondary | ICD-10-CM | POA: Diagnosis not present

## 2022-02-20 DIAGNOSIS — M542 Cervicalgia: Secondary | ICD-10-CM | POA: Diagnosis not present

## 2022-02-23 DIAGNOSIS — C44329 Squamous cell carcinoma of skin of other parts of face: Secondary | ICD-10-CM | POA: Diagnosis not present

## 2022-02-24 ENCOUNTER — Other Ambulatory Visit (HOSPITAL_COMMUNITY): Payer: Self-pay | Admitting: Cardiology

## 2022-02-27 MED ORDER — SILDENAFIL CITRATE 50 MG PO TABS: ORAL_TABLET | ORAL | 0 refills | Status: DC

## 2022-02-28 DIAGNOSIS — Z789 Other specified health status: Secondary | ICD-10-CM | POA: Diagnosis not present

## 2022-02-28 DIAGNOSIS — R059 Cough, unspecified: Secondary | ICD-10-CM | POA: Diagnosis not present

## 2022-02-28 DIAGNOSIS — Z299 Encounter for prophylactic measures, unspecified: Secondary | ICD-10-CM | POA: Diagnosis not present

## 2022-02-28 DIAGNOSIS — J069 Acute upper respiratory infection, unspecified: Secondary | ICD-10-CM | POA: Diagnosis not present

## 2022-02-28 DIAGNOSIS — R0602 Shortness of breath: Secondary | ICD-10-CM | POA: Diagnosis not present

## 2022-02-28 DIAGNOSIS — I10 Essential (primary) hypertension: Secondary | ICD-10-CM | POA: Diagnosis not present

## 2022-03-07 DIAGNOSIS — N1832 Chronic kidney disease, stage 3b: Secondary | ICD-10-CM | POA: Diagnosis not present

## 2022-03-07 DIAGNOSIS — J069 Acute upper respiratory infection, unspecified: Secondary | ICD-10-CM | POA: Diagnosis not present

## 2022-03-07 DIAGNOSIS — Z87891 Personal history of nicotine dependence: Secondary | ICD-10-CM | POA: Diagnosis not present

## 2022-03-07 DIAGNOSIS — I10 Essential (primary) hypertension: Secondary | ICD-10-CM | POA: Diagnosis not present

## 2022-03-07 DIAGNOSIS — Z6825 Body mass index (BMI) 25.0-25.9, adult: Secondary | ICD-10-CM | POA: Diagnosis not present

## 2022-03-07 DIAGNOSIS — Z299 Encounter for prophylactic measures, unspecified: Secondary | ICD-10-CM | POA: Diagnosis not present

## 2022-03-07 DIAGNOSIS — L4050 Arthropathic psoriasis, unspecified: Secondary | ICD-10-CM | POA: Diagnosis not present

## 2022-03-23 DIAGNOSIS — C44529 Squamous cell carcinoma of skin of other part of trunk: Secondary | ICD-10-CM | POA: Diagnosis not present

## 2022-03-28 DIAGNOSIS — Z6831 Body mass index (BMI) 31.0-31.9, adult: Secondary | ICD-10-CM | POA: Diagnosis not present

## 2022-03-28 DIAGNOSIS — R809 Proteinuria, unspecified: Secondary | ICD-10-CM | POA: Diagnosis not present

## 2022-04-03 DIAGNOSIS — D638 Anemia in other chronic diseases classified elsewhere: Secondary | ICD-10-CM | POA: Diagnosis not present

## 2022-04-03 DIAGNOSIS — E559 Vitamin D deficiency, unspecified: Secondary | ICD-10-CM | POA: Diagnosis not present

## 2022-04-03 DIAGNOSIS — E875 Hyperkalemia: Secondary | ICD-10-CM | POA: Diagnosis not present

## 2022-04-03 DIAGNOSIS — R809 Proteinuria, unspecified: Secondary | ICD-10-CM | POA: Diagnosis not present

## 2022-04-03 DIAGNOSIS — N1832 Chronic kidney disease, stage 3b: Secondary | ICD-10-CM | POA: Diagnosis not present

## 2022-04-03 DIAGNOSIS — I5022 Chronic systolic (congestive) heart failure: Secondary | ICD-10-CM | POA: Diagnosis not present

## 2022-04-21 ENCOUNTER — Encounter (HOSPITAL_COMMUNITY): Payer: Medicare HMO | Admitting: Cardiology

## 2022-04-21 ENCOUNTER — Ambulatory Visit (HOSPITAL_COMMUNITY): Payer: Medicare HMO

## 2022-04-21 DIAGNOSIS — Z79899 Other long term (current) drug therapy: Secondary | ICD-10-CM | POA: Diagnosis not present

## 2022-04-21 DIAGNOSIS — R69 Illness, unspecified: Secondary | ICD-10-CM | POA: Diagnosis not present

## 2022-04-21 DIAGNOSIS — B700 Diphyllobothriasis: Secondary | ICD-10-CM | POA: Diagnosis not present

## 2022-04-21 DIAGNOSIS — E211 Secondary hyperparathyroidism, not elsewhere classified: Secondary | ICD-10-CM | POA: Diagnosis not present

## 2022-04-21 DIAGNOSIS — I5022 Chronic systolic (congestive) heart failure: Secondary | ICD-10-CM | POA: Diagnosis not present

## 2022-04-21 DIAGNOSIS — E875 Hyperkalemia: Secondary | ICD-10-CM | POA: Diagnosis not present

## 2022-04-21 DIAGNOSIS — I10 Essential (primary) hypertension: Secondary | ICD-10-CM | POA: Diagnosis not present

## 2022-04-21 DIAGNOSIS — Z299 Encounter for prophylactic measures, unspecified: Secondary | ICD-10-CM | POA: Diagnosis not present

## 2022-04-21 DIAGNOSIS — G4700 Insomnia, unspecified: Secondary | ICD-10-CM | POA: Diagnosis not present

## 2022-04-21 DIAGNOSIS — D638 Anemia in other chronic diseases classified elsewhere: Secondary | ICD-10-CM | POA: Diagnosis not present

## 2022-04-21 DIAGNOSIS — R809 Proteinuria, unspecified: Secondary | ICD-10-CM | POA: Diagnosis not present

## 2022-04-21 DIAGNOSIS — N1832 Chronic kidney disease, stage 3b: Secondary | ICD-10-CM | POA: Diagnosis not present

## 2022-04-24 DIAGNOSIS — J811 Chronic pulmonary edema: Secondary | ICD-10-CM | POA: Diagnosis not present

## 2022-04-24 DIAGNOSIS — I700 Atherosclerosis of aorta: Secondary | ICD-10-CM | POA: Diagnosis not present

## 2022-04-24 DIAGNOSIS — J90 Pleural effusion, not elsewhere classified: Secondary | ICD-10-CM | POA: Diagnosis not present

## 2022-04-24 DIAGNOSIS — I517 Cardiomegaly: Secondary | ICD-10-CM | POA: Diagnosis not present

## 2022-04-24 DIAGNOSIS — C44529 Squamous cell carcinoma of skin of other part of trunk: Secondary | ICD-10-CM | POA: Diagnosis not present

## 2022-04-26 DIAGNOSIS — C44529 Squamous cell carcinoma of skin of other part of trunk: Secondary | ICD-10-CM | POA: Diagnosis not present

## 2022-05-01 DIAGNOSIS — Z299 Encounter for prophylactic measures, unspecified: Secondary | ICD-10-CM | POA: Diagnosis not present

## 2022-05-01 DIAGNOSIS — I10 Essential (primary) hypertension: Secondary | ICD-10-CM | POA: Diagnosis not present

## 2022-05-01 DIAGNOSIS — J069 Acute upper respiratory infection, unspecified: Secondary | ICD-10-CM | POA: Diagnosis not present

## 2022-05-03 DIAGNOSIS — Z299 Encounter for prophylactic measures, unspecified: Secondary | ICD-10-CM | POA: Diagnosis not present

## 2022-05-03 DIAGNOSIS — I10 Essential (primary) hypertension: Secondary | ICD-10-CM | POA: Diagnosis not present

## 2022-05-03 DIAGNOSIS — R062 Wheezing: Secondary | ICD-10-CM | POA: Diagnosis not present

## 2022-05-03 DIAGNOSIS — R0602 Shortness of breath: Secondary | ICD-10-CM | POA: Diagnosis not present

## 2022-05-03 DIAGNOSIS — I509 Heart failure, unspecified: Secondary | ICD-10-CM | POA: Diagnosis not present

## 2022-05-04 DIAGNOSIS — M75102 Unspecified rotator cuff tear or rupture of left shoulder, not specified as traumatic: Secondary | ICD-10-CM | POA: Diagnosis not present

## 2022-05-04 DIAGNOSIS — M25512 Pain in left shoulder: Secondary | ICD-10-CM | POA: Diagnosis not present

## 2022-05-06 ENCOUNTER — Encounter (HOSPITAL_COMMUNITY): Payer: Self-pay

## 2022-05-06 DIAGNOSIS — I10 Essential (primary) hypertension: Secondary | ICD-10-CM | POA: Diagnosis not present

## 2022-05-06 DIAGNOSIS — R1084 Generalized abdominal pain: Secondary | ICD-10-CM | POA: Diagnosis not present

## 2022-05-06 DIAGNOSIS — R110 Nausea: Secondary | ICD-10-CM | POA: Diagnosis not present

## 2022-05-06 DIAGNOSIS — N183 Chronic kidney disease, stage 3 unspecified: Secondary | ICD-10-CM | POA: Diagnosis not present

## 2022-05-06 DIAGNOSIS — N179 Acute kidney failure, unspecified: Secondary | ICD-10-CM | POA: Diagnosis not present

## 2022-05-06 DIAGNOSIS — I130 Hypertensive heart and chronic kidney disease with heart failure and stage 1 through stage 4 chronic kidney disease, or unspecified chronic kidney disease: Secondary | ICD-10-CM | POA: Diagnosis not present

## 2022-05-06 DIAGNOSIS — R079 Chest pain, unspecified: Secondary | ICD-10-CM | POA: Diagnosis not present

## 2022-05-06 DIAGNOSIS — I509 Heart failure, unspecified: Secondary | ICD-10-CM | POA: Diagnosis not present

## 2022-05-06 DIAGNOSIS — N1831 Chronic kidney disease, stage 3a: Secondary | ICD-10-CM | POA: Diagnosis not present

## 2022-05-06 DIAGNOSIS — E8770 Fluid overload, unspecified: Secondary | ICD-10-CM | POA: Diagnosis not present

## 2022-05-06 DIAGNOSIS — R109 Unspecified abdominal pain: Secondary | ICD-10-CM | POA: Diagnosis not present

## 2022-05-06 DIAGNOSIS — Z885 Allergy status to narcotic agent status: Secondary | ICD-10-CM | POA: Diagnosis not present

## 2022-05-06 DIAGNOSIS — Z743 Need for continuous supervision: Secondary | ICD-10-CM | POA: Diagnosis not present

## 2022-05-06 DIAGNOSIS — R1013 Epigastric pain: Secondary | ICD-10-CM | POA: Diagnosis not present

## 2022-05-08 ENCOUNTER — Other Ambulatory Visit: Payer: Self-pay

## 2022-05-08 ENCOUNTER — Emergency Department (HOSPITAL_COMMUNITY): Payer: Medicare HMO

## 2022-05-08 ENCOUNTER — Encounter (HOSPITAL_COMMUNITY): Payer: Self-pay | Admitting: Emergency Medicine

## 2022-05-08 ENCOUNTER — Emergency Department (HOSPITAL_COMMUNITY)
Admission: EM | Admit: 2022-05-08 | Discharge: 2022-05-09 | Disposition: A | Discharge status: Nursing Home (Includes ICF) | Payer: Medicare HMO | Attending: Student | Admitting: Student

## 2022-05-08 ENCOUNTER — Encounter (HOSPITAL_COMMUNITY): Payer: Self-pay

## 2022-05-08 DIAGNOSIS — I2510 Atherosclerotic heart disease of native coronary artery without angina pectoris: Secondary | ICD-10-CM | POA: Insufficient documentation

## 2022-05-08 DIAGNOSIS — R079 Chest pain, unspecified: Secondary | ICD-10-CM | POA: Diagnosis not present

## 2022-05-08 DIAGNOSIS — I509 Heart failure, unspecified: Secondary | ICD-10-CM | POA: Insufficient documentation

## 2022-05-08 DIAGNOSIS — I130 Hypertensive heart and chronic kidney disease with heart failure and stage 1 through stage 4 chronic kidney disease, or unspecified chronic kidney disease: Secondary | ICD-10-CM | POA: Diagnosis not present

## 2022-05-08 DIAGNOSIS — N183 Chronic kidney disease, stage 3 unspecified: Secondary | ICD-10-CM | POA: Diagnosis not present

## 2022-05-08 DIAGNOSIS — N179 Acute kidney failure, unspecified: Secondary | ICD-10-CM | POA: Diagnosis not present

## 2022-05-08 DIAGNOSIS — D7389 Other diseases of spleen: Secondary | ICD-10-CM | POA: Diagnosis not present

## 2022-05-08 DIAGNOSIS — R17 Unspecified jaundice: Secondary | ICD-10-CM | POA: Diagnosis not present

## 2022-05-08 DIAGNOSIS — Z7901 Long term (current) use of anticoagulants: Secondary | ICD-10-CM | POA: Insufficient documentation

## 2022-05-08 DIAGNOSIS — R1011 Right upper quadrant pain: Secondary | ICD-10-CM | POA: Insufficient documentation

## 2022-05-08 DIAGNOSIS — I10 Essential (primary) hypertension: Secondary | ICD-10-CM | POA: Diagnosis not present

## 2022-05-08 DIAGNOSIS — Z87891 Personal history of nicotine dependence: Secondary | ICD-10-CM | POA: Insufficient documentation

## 2022-05-08 DIAGNOSIS — R1030 Lower abdominal pain, unspecified: Secondary | ICD-10-CM | POA: Diagnosis not present

## 2022-05-08 DIAGNOSIS — J9811 Atelectasis: Secondary | ICD-10-CM | POA: Diagnosis not present

## 2022-05-08 DIAGNOSIS — Z6822 Body mass index (BMI) 22.0-22.9, adult: Secondary | ICD-10-CM | POA: Diagnosis not present

## 2022-05-08 DIAGNOSIS — Z79899 Other long term (current) drug therapy: Secondary | ICD-10-CM | POA: Diagnosis not present

## 2022-05-08 DIAGNOSIS — I482 Chronic atrial fibrillation, unspecified: Secondary | ICD-10-CM | POA: Diagnosis not present

## 2022-05-08 DIAGNOSIS — E1122 Type 2 diabetes mellitus with diabetic chronic kidney disease: Secondary | ICD-10-CM | POA: Diagnosis not present

## 2022-05-08 DIAGNOSIS — Z7984 Long term (current) use of oral hypoglycemic drugs: Secondary | ICD-10-CM | POA: Insufficient documentation

## 2022-05-08 DIAGNOSIS — R188 Other ascites: Secondary | ICD-10-CM | POA: Diagnosis not present

## 2022-05-08 DIAGNOSIS — I5023 Acute on chronic systolic (congestive) heart failure: Secondary | ICD-10-CM | POA: Insufficient documentation

## 2022-05-08 DIAGNOSIS — R109 Unspecified abdominal pain: Secondary | ICD-10-CM

## 2022-05-08 DIAGNOSIS — Z944 Liver transplant status: Secondary | ICD-10-CM | POA: Diagnosis not present

## 2022-05-08 DIAGNOSIS — Z299 Encounter for prophylactic measures, unspecified: Secondary | ICD-10-CM | POA: Diagnosis not present

## 2022-05-08 DIAGNOSIS — N3289 Other specified disorders of bladder: Secondary | ICD-10-CM | POA: Diagnosis not present

## 2022-05-08 DIAGNOSIS — Z951 Presence of aortocoronary bypass graft: Secondary | ICD-10-CM | POA: Insufficient documentation

## 2022-05-08 DIAGNOSIS — R0602 Shortness of breath: Secondary | ICD-10-CM | POA: Diagnosis not present

## 2022-05-08 LAB — URINALYSIS, ROUTINE W REFLEX MICROSCOPIC
Bilirubin Urine: NEGATIVE
Glucose, UA: 150 mg/dL — AB
Ketones, ur: NEGATIVE mg/dL
Leukocytes,Ua: NEGATIVE
Nitrite: NEGATIVE
Protein, ur: 100 mg/dL — AB
Specific Gravity, Urine: 1.013 (ref 1.005–1.030)
pH: 5 (ref 5.0–8.0)

## 2022-05-08 LAB — CBC
HCT: 33.5 % — ABNORMAL LOW (ref 39.0–52.0)
Hemoglobin: 10 g/dL — ABNORMAL LOW (ref 13.0–17.0)
MCH: 25.3 pg — ABNORMAL LOW (ref 26.0–34.0)
MCHC: 29.9 g/dL — ABNORMAL LOW (ref 30.0–36.0)
MCV: 84.8 fL (ref 80.0–100.0)
Platelets: 120 10*3/uL — ABNORMAL LOW (ref 150–400)
RBC: 3.95 MIL/uL — ABNORMAL LOW (ref 4.22–5.81)
RDW: 16.5 % — ABNORMAL HIGH (ref 11.5–15.5)
WBC: 8.8 10*3/uL (ref 4.0–10.5)
nRBC: 0.2 % (ref 0.0–0.2)

## 2022-05-08 LAB — COMPREHENSIVE METABOLIC PANEL
ALT: 3164 U/L — ABNORMAL HIGH (ref 0–44)
AST: 3976 U/L — ABNORMAL HIGH (ref 15–41)
Albumin: 3.9 g/dL (ref 3.5–5.0)
Alkaline Phosphatase: 103 U/L (ref 38–126)
Anion gap: 17 — ABNORMAL HIGH (ref 5–15)
BUN: 128 mg/dL — ABNORMAL HIGH (ref 8–23)
CO2: 15 mmol/L — ABNORMAL LOW (ref 22–32)
Calcium: 7 mg/dL — ABNORMAL LOW (ref 8.9–10.3)
Chloride: 105 mmol/L (ref 98–111)
Creatinine, Ser: 8.21 mg/dL — ABNORMAL HIGH (ref 0.61–1.24)
GFR, Estimated: 7 mL/min — ABNORMAL LOW (ref >60)
Glucose, Bld: 186 mg/dL — ABNORMAL HIGH (ref 70–99)
Potassium: 6.6 mmol/L (ref 3.5–5.1)
Sodium: 137 mmol/L (ref 135–145)
Total Bilirubin: 1.9 mg/dL — ABNORMAL HIGH (ref 0.3–1.2)
Total Protein: 7.1 g/dL (ref 6.5–8.1)

## 2022-05-08 LAB — TROPONIN I (HIGH SENSITIVITY)
Troponin I (High Sensitivity): 717 ng/L (ref <18)
Troponin I (High Sensitivity): 684 ng/L (ref <18)

## 2022-05-08 LAB — LIPASE, BLOOD: Lipase: 90 U/L — ABNORMAL HIGH (ref 11–51)

## 2022-05-08 LAB — CK: Total CK: 840 U/L — ABNORMAL HIGH (ref 49–397)

## 2022-05-08 LAB — PROTIME-INR
INR: 3.7 — ABNORMAL HIGH (ref 0.8–1.2)
Prothrombin Time: 36.3 seconds — ABNORMAL HIGH (ref 11.4–15.2)

## 2022-05-08 LAB — POTASSIUM
Potassium: 6.4 mmol/L (ref 3.5–5.1)
Potassium: 6.4 mmol/L (ref 3.5–5.1)

## 2022-05-08 LAB — BRAIN NATRIURETIC PEPTIDE: B Natriuretic Peptide: >4500 pg/mL — ABNORMAL HIGH (ref 0.0–100.0)

## 2022-05-08 MED ORDER — SODIUM ZIRCONIUM CYCLOSILICATE 5 G PO PACK
20.0000 g | PACK | Freq: Once | ORAL | Status: AC
  Administered 2022-05-08: 20 g via ORAL
  Filled 2022-05-08: qty 4

## 2022-05-08 MED ORDER — FUROSEMIDE 10 MG/ML IJ SOLN
160.0000 mg | Freq: Once | INTRAVENOUS | Status: AC
  Administered 2022-05-08: 160 mg via INTRAVENOUS
  Filled 2022-05-08: qty 16

## 2022-05-08 MED ORDER — INSULIN ASPART 100 UNIT/ML IV SOLN
5.0000 [IU] | Freq: Once | INTRAVENOUS | Status: AC
  Administered 2022-05-09: 5 [IU] via INTRAVENOUS

## 2022-05-08 MED ORDER — ONDANSETRON HCL 4 MG/2ML IJ SOLN
4.0000 mg | Freq: Once | INTRAMUSCULAR | Status: AC
  Administered 2022-05-08: 4 mg via INTRAVENOUS
  Filled 2022-05-08: qty 2

## 2022-05-08 MED ORDER — INSULIN ASPART 100 UNIT/ML IV SOLN
5.0000 [IU] | Freq: Once | INTRAVENOUS | Status: AC
  Administered 2022-05-08: 5 [IU] via INTRAVENOUS

## 2022-05-08 MED ORDER — CALCIUM GLUCONATE-NACL 1-0.675 GM/50ML-% IV SOLN
1.0000 g | Freq: Once | INTRAVENOUS | Status: AC
  Administered 2022-05-09: 1000 mg via INTRAVENOUS
  Filled 2022-05-08: qty 50

## 2022-05-08 MED ORDER — DEXTROSE 50 % IV SOLN
1.0000 | Freq: Once | INTRAVENOUS | Status: AC
  Administered 2022-05-09: 50 mL via INTRAVENOUS
  Filled 2022-05-08: qty 50

## 2022-05-08 MED ORDER — SODIUM BICARBONATE 8.4 % IV SOLN
50.0000 meq | Freq: Once | INTRAVENOUS | Status: AC
  Administered 2022-05-09: 50 meq via INTRAVENOUS
  Filled 2022-05-08: qty 50

## 2022-05-08 MED ORDER — FUROSEMIDE 10 MG/ML IJ SOLN
INTRAMUSCULAR | Status: AC
  Filled 2022-05-08: qty 20

## 2022-05-08 MED ORDER — CALCIUM GLUCONATE-NACL 1-0.675 GM/50ML-% IV SOLN
1.0000 g | Freq: Once | INTRAVENOUS | Status: AC
  Administered 2022-05-08: 1000 mg via INTRAVENOUS
  Filled 2022-05-08: qty 50

## 2022-05-08 MED ORDER — SODIUM BICARBONATE 8.4 % IV SOLN
50.0000 meq | Freq: Once | INTRAVENOUS | Status: AC
  Administered 2022-05-08: 50 meq via INTRAVENOUS
  Filled 2022-05-08: qty 50

## 2022-05-08 MED ORDER — DEXTROSE 50 % IV SOLN
1.0000 | Freq: Once | INTRAVENOUS | Status: AC
  Administered 2022-05-08: 50 mL via INTRAVENOUS
  Filled 2022-05-08: qty 50

## 2022-05-08 MED ORDER — ACETAMINOPHEN 325 MG PO TABS
650.0000 mg | ORAL_TABLET | Freq: Once | ORAL | Status: AC
  Administered 2022-05-08: 650 mg via ORAL
  Filled 2022-05-08: qty 2

## 2022-05-08 MED ORDER — FENTANYL CITRATE PF 50 MCG/ML IJ SOSY
50.0000 ug | PREFILLED_SYRINGE | Freq: Once | INTRAMUSCULAR | Status: AC
  Administered 2022-05-08: 50 ug via INTRAVENOUS
  Filled 2022-05-08: qty 1

## 2022-05-08 MED ORDER — METOLAZONE 2.5 MG PO TABS
5.0000 mg | ORAL_TABLET | Freq: Once | ORAL | Status: AC
  Administered 2022-05-08: 5 mg via ORAL
  Filled 2022-05-08: qty 2

## 2022-05-08 NOTE — ED Triage Notes (Signed)
Pt reports abd pain, n/v, weakness, unable to ambulate.  Reports history of CHF.  Denies any swelling in extremities.  C/O sob and chest pain.  Reports symptoms began 3 days ago.

## 2022-05-08 NOTE — Progress Notes (Signed)
Brief nephrology note  Called about patient presenting with fatigue, difficulty ambulating, some shortness of breath.  Case was discussed with cardiology who was concern for right-sided heart disease; he has known advanced heart failure.  Chest x-ray without significant pulmonary edema.  Possibly some volume overload on exam per ER.  Creatinine up to 8.2 from baseline around 2-2.3.  Potassium is 6.6.  Troponin noted in 717  Unclear cause of LFT abnormalities at this time.  Urinalysis with some moderate blood but no significant RBCs.  Patient received some shifting agents.  I recommended 20 g of Lokelma as well as IV Lasix 160 mg plus metolazone 5 mg.  I will also provide some sodium bicarbonate.  If potassium improves patient can stay in any pen but if it worsens or fails to improve I think he would be better served at [facility].

## 2022-05-08 NOTE — ED Provider Notes (Signed)
[facility] EMERGENCY DEPARTMENT Provider Note  CSN: [id number] Arrival date & time: 05/08/22 1603  Chief Complaint(s) Abdominal Pain  HPI Joseph Greer is a 66 y.o. male with PMH CAD status post CABG in 2021 on Eliquis, CKD 3, CHF with EF 20 to 25%, T2DM, hep C status post antiviral treatment with undetectable viral level, liver transplant in 2009 at [facility] currently following outpatient with [facility] who presents emergency department for evaluation of abdominal pain.  Patient was reportedly seen at [facility] on 05/06/2022 and was found to have progressive worsening renal failure and they attempted to transfer the patient to [facility] but hospitalist they are declined admission with an attempt to diurese the patient for fluid overload and renal failure.  The patient did not want to wait for transfer and ultimately ended up signing out AMA and presents to [facility] with similar complaints.  Endorses right upper quadrant abdominal pain and suprapubic abdominal pain with associated nausea.  Endorses intermittent chest pain but denies shortness of breath, headache, fever or other systemic symptoms.  States he has been compliant with his antirejection medications.   Past Medical History Past Medical History:  Diagnosis Date   Anxiety    CAD (coronary artery disease)    a. s/p CABG on 12/19/2019 with LIMA-LAD, seq SVG-RPDA-PL, RIMA-RI and SVG-D2 with Maze and LAA clipping   CHF (congestive heart failure) ([place])    a. EF 20-25% by echo in 12/2019   CKD (chronic kidney disease), stage III ([place])    Depression    Diabetes mellitus type 2, noninsulin dependent ([place])    Hepatitis C    Hypertension    Psoriasis    Status post liver transplant [facility])    Patient Active Problem List   Diagnosis Date Noted   Acute systolic CHF (congestive heart failure) ([place]) 04/02/2020   Acute on chronic systolic CHF (congestive heart failure) ([place]) 04/01/2020   Malnutrition of moderate degree 12/22/2019   S/P  CABG x 5 12/19/2019   Presence of aortocoronary bypass graft 12/19/2019   Myocardial infarction ([place]) 12/11/2019   Immunosuppression ([place]) 02/13/2017   Personal history of colonic polyps 01/31/2017   Personal history of other malignant neoplasm of skin 01/25/2017   Hepatitis B core antibody positive 12/24/2014   Chest pain, unspecified 04/09/2013   Chest pain 03/26/2013   Fatigue 08/05/2012   Other specified postprocedural states 04/17/2012   Hypertensive disorder 01/24/2012   Squamous cell carcinoma of skin of face 01/24/2012   Type 2 diabetes mellitus without complications ([place]) 73/53/2992   Other peripheral vertigo, unspecified ear 10/03/2011   Vertigo 09/04/2011   Depressive disorder, not elsewhere classified 07/28/2008   Status post liver transplantation ([place]) 01/27/2008   Esophageal varices ([place]) 10/29/2007   Psoriasis 10/01/2007   Chronic viral hepatitis C ([place]) 10/01/2007   Home Medication(s) Prior to Admission medications   Medication Sig Start Date End Date Taking? Authorizing Provider  Apremilast (OTEZLA) 30 MG TABS Take 1 tablet by mouth 2 (two) times daily. 06/16/19   [provider]  atorvastatin (LIPITOR) 80 MG tablet Take 1 tablet (80 mg total) by mouth daily. 08/26/21 08/26/22  Larey Dresser, MD  carvedilol (COREG) 6.25 MG tablet Take 1.5 tablets (9.375 mg total) by mouth 2 (two) times daily with a meal. 01/06/22   Larey Dresser, MD  dapagliflozin propanediol (FARXIGA) 10 MG TABS tablet Take 1 tablet (10 mg total) by mouth daily before breakfast. 08/29/21   Larey Dresser, MD  Arne Cleveland  5 MG TABS tablet Take 1 tablet by mouth twice daily 08/23/20   Verta Ellen., NP  FLUoxetine (PROZAC) 40 MG capsule Take 40 mg by mouth daily. 01/29/22   [provider]  LORazepam (ATIVAN) 1 MG tablet Take 1 mg by mouth 2 (two) times daily as needed (panic attacks).  11/28/19   [provider]  sacubitril-valsartan (ENTRESTO) 24-26 MG Take 1 tablet by  mouth 2 (two) times daily. 08/29/21   Larey Dresser, MD  sildenafil (VIAGRA) 50 MG tablet TAKE 1 TABLET BY MOUTH AS NEEDED FOR ERECTILE DYSFUNCTION 02/27/22   Larey Dresser, MD  spironolactone (ALDACTONE) 25 MG tablet Take 12.5 mg by mouth daily.    [provider]  tacrolimus (PROGRAF) 1 MG capsule Take 2 tabs by mouth every morning & 1 tab every evening    [provider]  temazepam (RESTORIL) 30 MG capsule Take 30 mg by mouth at bedtime. 01/30/20   [provider]  triamcinolone cream (KENALOG) 0.1 % Apply 1 application topically 2 (two) times daily.    [provider]                                                                                                                                    Past Surgical History Past Surgical History:  Procedure Laterality Date   CLIPPING OF ATRIAL APPENDAGE N/A 12/19/2019   Procedure: CLIPPING OF ATRIAL APPENDAGE USING ATRICURE CLIP SIZE 50MM;  Surgeon: Wonda Olds, MD;  Location: [place];  Service: Open Heart Surgery;  Laterality: N/A;   COLONOSCOPY     COLONOSCOPY WITH PROPOFOL N/A 06/01/2017   Procedure: COLONOSCOPY WITH PROPOFOL;  Surgeon: Rogene Houston, MD;  [facility];  Service: Endoscopy;  Laterality: N/A;  7:30   CORONARY ARTERY BYPASS GRAFT N/A 12/19/2019   Procedure: CORONARY ARTERY BYPASS GRAFTING (CABG) TIMES  X 5,   USING BILATERAL MAMMARY ARTERY AND RIGHT LEG GREATER SAPHENOUS VEIN HARVESTED ENDOSCOPICALLY;  Surgeon: Wonda Olds, MD;  Location: [place];  Service: Open Heart Surgery;  Laterality: N/A;   HERNIA REPAIR Right    IR FLUORO GUIDE CV LINE RIGHT  12/30/2019   IR REMOVAL TUN CV CATH W/O FL  02/12/2020   IR US GUIDE VASC ACCESS RIGHT  12/30/2019   LEFT HEART CATH AND CORONARY ANGIOGRAPHY N/A 12/12/2019   Procedure: LEFT HEART CATH AND CORONARY ANGIOGRAPHY;  Surgeon: Jettie Booze, MD;  Location: [place] CV LAB;  Service: Cardiovascular;  Laterality: N/A;   LIVER  BIOPSY     LIVER SURGERY     MAZE N/A 12/19/2019   Procedure: MAZE;  Surgeon: Wonda Olds, MD;  Location: [place];  Service: Open Heart Surgery;  Laterality: N/A;   POLYPECTOMY  06/01/2017   Procedure: POLYPECTOMY;  Surgeon: Rogene Houston, MD;  [facility];  Service: Endoscopy;;  polyp at sigmoid colon x2,  ascending colon polyp, hepatic flexure polyp   RIGHT HEART CATH N/A 12/17/2019   Procedure: RIGHT HEART CATH;  Surgeon: Larey Dresser, MD;  Location: [place] CV LAB;  Service: Cardiovascular;  Laterality: N/A;   TEE WITHOUT CARDIOVERSION N/A 12/19/2019   Procedure: TRANSESOPHAGEAL ECHOCARDIOGRAM (TEE);  Surgeon: Wonda Olds, MD;  Location: [place];  Service: Open Heart Surgery;  Laterality: N/A;   UPPER GASTROINTESTINAL ENDOSCOPY     Family History Family History  Problem Relation Age of Onset   Diabetes Mother    Heart disease Father    Heart disease Son     Social History Social History   Tobacco Use   Smoking status: Former    Packs/day: 1.00    Types: Cigarettes    Quit date: 09/04/2007    Years since quitting: 14.6   Smokeless tobacco: Never  Vaping Use   Vaping Use: Never used  Substance Use Topics   Alcohol use: No   Drug use: No   Allergies Morphine and related  Review of Systems Review of Systems  Gastrointestinal:  Positive for abdominal pain, nausea and vomiting.    Physical Exam Vital Signs  I have reviewed the triage vital signs BP (!) 142/88   Pulse 76   Temp 97.7 F (36.5 C) (Oral)   Resp (!) 25   Ht 6' (1.829 m)   Wt 79.4 kg   SpO2 97%   BMI 23.73 kg/m   Physical Exam Constitutional:      General: He is not in acute distress.    Appearance: Normal appearance. He is ill-appearing.  HENT:     Head: Normocephalic and atraumatic.     Nose: No congestion or rhinorrhea.  Eyes:     General: Scleral icterus present.        Right eye: No discharge.        Left eye: No discharge.     Extraocular Movements:  Extraocular movements intact.     Pupils: Pupils are equal, round, and reactive to light.  Cardiovascular:     Rate and Rhythm: Normal rate and regular rhythm.     Heart sounds: No murmur heard. Pulmonary:     Effort: No respiratory distress.     Breath sounds: No wheezing or rales.  Abdominal:     General: There is no distension.     Tenderness: There is abdominal tenderness in the right upper quadrant and suprapubic area.  Musculoskeletal:        General: Normal range of motion.     Cervical back: Normal range of motion.  Skin:    General: Skin is warm and dry.     Coloration: Skin is jaundiced.  Neurological:     General: No focal deficit present.     Mental Status: He is alert.     ED Results and Treatments Labs (all labs ordered are listed, but only abnormal results are displayed) Labs Reviewed  LIPASE, BLOOD - Abnormal; Notable for the following components:      Result Value   Lipase 90 (*)    All other components within normal limits  COMPREHENSIVE METABOLIC PANEL - Abnormal; Notable for the following components:   Potassium 6.6 (*)    CO2 15 (*)    Glucose, Bld 186 (*)    BUN 128 (*)    Creatinine, Ser 8.21 (*)    Calcium 7.0 (*)    AST 3,976 (*)    ALT 3,164 (*)    Total Bilirubin  1.9 (*)    GFR, Estimated 7 (*)    Anion gap 17 (*)    All other components within normal limits  CBC - Abnormal; Notable for the following components:   RBC 3.95 (*)    Hemoglobin 10.0 (*)    HCT 33.5 (*)    MCH 25.3 (*)    MCHC 29.9 (*)    RDW 16.5 (*)    Platelets 120 (*)    All other components within normal limits  URINALYSIS, ROUTINE W REFLEX MICROSCOPIC - Abnormal; Notable for the following components:   APPearance HAZY (*)    Glucose, UA 150 (*)    Hgb urine dipstick MODERATE (*)    Protein, ur 100 (*)    Bacteria, UA RARE (*)    All other components within normal limits  TROPONIN I (HIGH SENSITIVITY) - Abnormal; Notable for the following components:   Troponin I  (High Sensitivity) 717 (*)    All other components within normal limits  POTASSIUM  POTASSIUM  POTASSIUM  POTASSIUM  BRAIN NATRIURETIC PEPTIDE  HEPATITIS PANEL, ACUTE  TROPONIN I (HIGH SENSITIVITY)                                                                                                                          Radiology CT Renal Stone Study  Result Date: 05/08/2022 CLINICAL DATA:  Abdominal/flank pain, stone suspected concern for obstructive uropathy EXAM: CT ABDOMEN AND PELVIS WITHOUT CONTRAST TECHNIQUE: Multidetector CT imaging of the abdomen and pelvis was performed following the standard protocol without IV contrast. RADIATION DOSE REDUCTION: This exam was performed according to the departmental dose-optimization program which includes automated exposure control, adjustment of the mA and/or kV according to patient size and/or use of iterative reconstruction technique. COMPARISON:  CT 2 days ago at [place]: Lower chest: Chronic cardiomegaly. Decreased density of the blood pool consistent with anemia. Small right pleural effusion which has increased over the last 2 days. Previous left pleural effusion has resolved. Presumed atelectasis in the medial right lower lobe related to pleural effusion. Hepatobiliary: Postsurgical change at the hepatic hilum, medical records indicate history of liver transplant. Stable fluid density structure about the medial aspect of the liver, nonspecific. There is motion artifact through the mid and lower aspect of the liver. No biliary dilatation. Pancreas: No ductal dilatation or inflammation. Spleen: Calcified splenic granuloma. Normal in size. Adrenals/Urinary Tract: No adrenal nodule. No hydronephrosis. Question of perinephric edema about the right kidney, motion artifact obscured. There is mild perinephric edema about the left kidney. Calcifications at the left renal hilum felt to be vascular. There may be punctate nonobstructing left renal  stones. No focal renal abnormalities. The urinary bladder is partially distended, normal for degree of distension. Stomach/Bowel: Motion artifact limits detailed bowel assessment. No evidence of bowel obstruction or inflammatory change. Stable high-density within the appendix (possible enteric contrast versus bismuth containing products), no appendicitis. Vascular/Lymphatic: Aortic and branch atherosclerosis. No bulky adenopathy, motion limited. Reproductive:  Prostate is unremarkable. Other: Mild diffuse body wall edema which is similar to prior exam. Prior right inguinal hernia repair. No ascites. No free air. Musculoskeletal: Stable osseous structures, no acute findings. IMPRESSION: 1. No hydronephrosis or obstructive uropathy. Possible punctate nonobstructing left renal stones. 2. Mild left perinephric edema. Question of perinephric edema about the right kidney, motion artifact obscured. Recommend correlation with urinalysis to exclude urinary tract infection. 3. Small right pleural effusion which has increased over the last 2 days. Previous left pleural effusion has resolved. 4. Additional chronic findings are stable from prior exam. Aortic Atherosclerosis (ICD10-I70.0). Electronically Signed   By: Keith Rake M.D.   On: 05/08/2022 19:53   DG Chest 2 View  Result Date: 05/08/2022 CLINICAL DATA:  Shortness of breath and chest pain EXAM: CHEST - 2 VIEW COMPARISON:  Chest x-ray 10/21/2021 FINDINGS: Patient is status post cardiac surgery. The heart is enlarged, unchanged. There is minimal atelectasis in the inferior left upper lobe. The lungs are otherwise clear. There is no pleural effusion or pneumothorax. No acute fractures are seen. IMPRESSION: 1. No active cardiopulmonary disease. 2. Stable cardiomegaly. Electronically Signed   By: Ronney Asters M.D.   On: 05/08/2022 17:19    Pertinent labs & imaging results that were available during my care of the patient were reviewed by me and considered in my  medical decision making (see MDM for details).  Medications Ordered in ED Medications  furosemide (LASIX) 160 mg in dextrose 5 % 50 mL IVPB (has no administration in time range)  calcium gluconate 1 g/ 50 mL sodium chloride IVPB (1,000 mg Intravenous New Bag/Given 05/08/22 1916)  insulin aspart (novoLOG) injection 5 Units (5 Units Intravenous Given 05/08/22 1914)    And  dextrose 50 % solution 50 mL (50 mLs Intravenous Given 05/08/22 1913)  ondansetron (ZOFRAN) injection 4 mg (4 mg Intravenous Given 05/08/22 1908)  fentaNYL (SUBLIMAZE) injection 50 mcg (50 mcg Intravenous Given 05/08/22 1908)  sodium zirconium cyclosilicate (LOKELMA) packet 20 g (20 g Oral Given 05/08/22 2022)  acetaminophen (TYLENOL) tablet 650 mg (650 mg Oral Given 05/08/22 2022)  metolazone (ZAROXOLYN) tablet 5 mg (5 mg Oral Given 05/08/22 2022)                                                                                                                                     Procedures .Critical Care  Performed by: Teressa Lower, MD Authorized by: Teressa Lower, MD   Critical care provider statement:    Critical care time (minutes):  104   Critical care was necessary to treat or prevent imminent or life-threatening deterioration of the following conditions:  Renal failure, cardiac failure and hepatic failure   Critical care was time spent personally by me on the following activities:  Development of treatment plan with patient or surrogate, discussions with consultants, evaluation of patient's response to treatment, examination of patient, ordering and review of laboratory studies,  ordering and review of radiographic studies, ordering and performing treatments and interventions, pulse oximetry, re-evaluation of patient's condition and review of old charts   (including critical care time)  Medical Decision Making / ED Course   This patient presents to the ED for concern of abdominal pain nausea and vomiting, this  involves an extensive number of treatment options, and is a complaint that carries with it a high risk of complications and morbidity.  The differential diagnosis includes biliary obstruction, shock liver, hepatorenal syndrome, cardiorenal syndrome, ACS, intra-abdominal infection, obstructive uropathy  MDM: Seen the emergency room for evaluation of multiple complaints as described above.  Physical exam reveals an ill-appearing jaundiced patient with tenderness in the right upper quadrant and in the suprapubic region.  Initial laboratory evaluation is quite concerning with a potassium of 6.6, BUN of 128, creatinine 8.21, initial high-sensitivity troponin 717, BNP greater than 4500, INR 3.7, AST 3976, ALT 3164 , hemoglobin 10.0 platelet count 120.  Urinalysis is negative for infection.  CK elevated to 840.  Chest x-ray with stable cardiomegaly and CT stone study obtained to rule out obstructive uropathy that was reassuringly negative for obstructive uropathy does show a worsening of right-sided pleural effusion.  No biliary dilatation seen.  ECG with new inferior T wave inversions and I spoke with the cardiologist on-call Dr. Myles Gip who is primarily more concerned about the patient's electrolyte abnormalities and ischemia at this time and this is likely demand ischemia from decreased renal clearance which I agree.  Patient immediately received calcium, insulin, glucose and bicarb.  I spoke with Dr. Osborne Casco of nephrology about the possibility of emergent dialysis and he is recommending additional shifting agents with [facility] and a large dose of 160 mg of Lasix with repeat potassium to further guide care.  I spoke with the ICU about concern for hepatorenal/cardiorenal syndrome and there requesting discussion with [facility] transplant about possible transfer to [facility] given patient's multiorgan failure in the setting of a liver transplant.  I did speak with multiple providers at [facility] and the patient was ultimately accepted  by a Dr. Monia Pouch for stepdown admission at [facility].  Repeat potassium was stable at 6.4 but remains critically high and insulin, calcium, glucose and bicarb redosed.  Patient then transferred to [facility]   Additional history obtained:  -External records from outside source obtained and reviewed including: Chart review including previous notes, labs, imaging, consultation notes   Lab Tests: -I ordered, reviewed, and interpreted labs.   The pertinent results include:   Labs Reviewed  LIPASE, BLOOD - Abnormal; Notable for the following components:      Result Value   Lipase 90 (*)    All other components within normal limits  COMPREHENSIVE METABOLIC PANEL - Abnormal; Notable for the following components:   Potassium 6.6 (*)    CO2 15 (*)    Glucose, Bld 186 (*)    BUN 128 (*)    Creatinine, Ser 8.21 (*)    Calcium 7.0 (*)    AST 3,976 (*)    ALT 3,164 (*)    Total Bilirubin 1.9 (*)    GFR, Estimated 7 (*)    Anion gap 17 (*)    All other components within normal limits  CBC - Abnormal; Notable for the following components:   RBC 3.95 (*)    Hemoglobin 10.0 (*)    HCT 33.5 (*)    MCH 25.3 (*)    MCHC 29.9 (*)    RDW 16.5 (*)  Platelets 120 (*)    All other components within normal limits  URINALYSIS, ROUTINE W REFLEX MICROSCOPIC - Abnormal; Notable for the following components:   APPearance HAZY (*)    Glucose, UA 150 (*)    Hgb urine dipstick MODERATE (*)    Protein, ur 100 (*)    Bacteria, UA RARE (*)    All other components within normal limits  TROPONIN I (HIGH SENSITIVITY) - Abnormal; Notable for the following components:   Troponin I (High Sensitivity) 717 (*)    All other components within normal limits  POTASSIUM  POTASSIUM  POTASSIUM  POTASSIUM  BRAIN NATRIURETIC PEPTIDE  HEPATITIS PANEL, ACUTE  TROPONIN I (HIGH SENSITIVITY)      EKG   EKG Interpretation  Date/Time:  Monday May 08 2022 19:16:06 EST Ventricular Rate:  75 PR  Interval:  140 QRS Duration: 178 QT Interval:  444 QTC Calculation: 496 R Axis:   97 Text Interpretation: Sinus or ectopic atrial rhythm RBBB and LPFB Probable anterior infarct, age indeterminate new inferior T wave inversions Confirmed by [place] ([id number]) on 05/08/2022 11:53:17 PM         Imaging Studies ordered: I ordered imaging studies including CT stone, CXR I independently visualized and interpreted imaging. I agree with the radiologist interpretation   Medicines ordered and prescription drug management: Meds ordered this encounter  Medications   calcium gluconate 1 g/ 50 mL sodium chloride IVPB   AND Linked Order Group    insulin aspart (novoLOG) injection 5 Units    dextrose 50 % solution 50 mL   ondansetron (ZOFRAN) injection 4 mg   fentaNYL (SUBLIMAZE) injection 50 mcg   sodium zirconium cyclosilicate (LOKELMA) packet 20 g   acetaminophen (TYLENOL) tablet 650 mg   metolazone (ZAROXOLYN) tablet 5 mg   furosemide (LASIX) 160 mg in dextrose 5 % 50 mL IVPB    -I have reviewed the patients home medicines and have made adjustments as needed  Critical interventions Multiple consultations, insulin, glucose, calcium, bicarb  Consultations Obtained: I requested consultation with multiple providers including cardiology, nephrology, ICU attendings,  and discussed lab and imaging findings as well as pertinent plan - they recommend: Transfer to [facility] for stepdown admission, additional shifting agents   Cardiac Monitoring: The patient was maintained on a cardiac monitor.  I personally viewed and interpreted the cardiac monitored which showed an underlying rhythm of: NSR  Social Determinants of Health:  Factors impacting patients care include: none   Reevaluation: After the interventions noted above, I reevaluated the patient and found that they have :stayed the same  Co morbidities that complicate the patient evaluation  Past Medical History:  Diagnosis Date    Anxiety    CAD (coronary artery disease)    a. s/p CABG on 12/19/2019 with LIMA-LAD, seq SVG-RPDA-PL, RIMA-RI and SVG-D2 with Maze and LAA clipping   CHF (congestive heart failure) ([place])    a. EF 20-25% by echo in 12/2019   CKD (chronic kidney disease), stage III ([place])    Depression    Diabetes mellitus type 2, noninsulin dependent ([place])    Hepatitis C    Hypertension    Psoriasis    Status post liver transplant ([place])       Dispostion: I considered admission for this patient, and due to renal failure, suspected possible liver failure versus transplant rejection versus shock liver patient require hospital admission     Final Clinical Impression(s) / ED Diagnoses Final diagnoses:  None     @  Madlyn Frankel, MD 05/08/22 [phone]

## 2022-05-08 NOTE — ED Provider Notes (Incomplete)
11:26 PM Assumed care from Dr. Matilde Sprang, please see their note for full history, physical and decision making until this point. In brief this is a 66 y.o. year old male who presented to the ED tonight with Abdominal Pain     Hep c, HF, s/p liver txplnt followed by [facility]. Worsening renal failure since yesterday, left OSH AMA. VSS, creatinine worse. Potassium worsening. Needs acute dialysis. Had some temporizing treatments. K still high, getting redosed. Accepted to [facility], stepdown. Continue following his K for now.   Third K still at 6.4. received all possible temporizing treatments. [facility] here for transfer to definitive treatment. Discussed with them to use Ca/Bicarb for any tacchyarrhythmias that may arise however has not had any here and should still be stable for transport as he's been relatively stable for >8 hours here. EMTALA updated.   CRITICAL CARE Performed by: Merrily Pew Total critical care time: 35 minutes Critical care time was exclusive of separately billable procedures and treating other patients. Critical care was necessary to treat or prevent imminent or life-threatening deterioration. Critical care was time spent personally by me on the following activities: development of treatment plan with patient and/or surrogate as well as nursing, discussions with consultants, evaluation of patient's response to treatment, examination of patient, obtaining history from patient or surrogate, ordering and performing treatments and interventions, ordering and review of laboratory studies, ordering and review of radiographic studies, pulse oximetry and re-evaluation of patient's condition.   Labs, studies and imaging reviewed by myself and considered in medical decision making if ordered. Imaging interpreted by radiology.  Labs Reviewed  LIPASE, BLOOD - Abnormal; Notable for the following components:      Result Value   Lipase 90 (*)    All other components within normal limits   COMPREHENSIVE METABOLIC PANEL - Abnormal; Notable for the following components:   Potassium 6.6 (*)    CO2 15 (*)    Glucose, Bld 186 (*)    BUN 128 (*)    Creatinine, Ser 8.21 (*)    Calcium 7.0 (*)    AST 3,976 (*)    ALT 3,164 (*)    Total Bilirubin 1.9 (*)    GFR, Estimated 7 (*)    Anion gap 17 (*)    All other components within normal limits  CBC - Abnormal; Notable for the following components:   RBC 3.95 (*)    Hemoglobin 10.0 (*)    HCT 33.5 (*)    MCH 25.3 (*)    MCHC 29.9 (*)    RDW 16.5 (*)    Platelets 120 (*)    All other components within normal limits  URINALYSIS, ROUTINE W REFLEX MICROSCOPIC - Abnormal; Notable for the following components:   APPearance HAZY (*)    Glucose, UA 150 (*)    Hgb urine dipstick MODERATE (*)    Protein, ur 100 (*)    Bacteria, UA RARE (*)    All other components within normal limits  POTASSIUM - Abnormal; Notable for the following components:   Potassium 6.4 (*)    All other components within normal limits  POTASSIUM - Abnormal; Notable for the following components:   Potassium 6.4 (*)    All other components within normal limits  BRAIN NATRIURETIC PEPTIDE - Abnormal; Notable for the following components:   B Natriuretic Peptide >4,500.0 (*)    All other components within normal limits  CK - Abnormal; Notable for the following components:   Total CK 840 (*)  All other components within normal limits  PROTIME-INR - Abnormal; Notable for the following components:   Prothrombin Time 36.3 (*)    INR 3.7 (*)    All other components within normal limits  TROPONIN I (HIGH SENSITIVITY) - Abnormal; Notable for the following components:   Troponin I (High Sensitivity) 717 (*)    All other components within normal limits  TROPONIN I (HIGH SENSITIVITY) - Abnormal; Notable for the following components:   Troponin I (High Sensitivity) 684 (*)    All other components within normal limits  POTASSIUM  POTASSIUM  HEPATITIS PANEL,  ACUTE  TACROLIMUS LEVEL  POTASSIUM    CT Renal Stone Study  Final Result    DG Chest 2 View  Final Result      No follow-ups on file.    Hend Mccarrell, Corene Cornea, MD 05/09/22 [id number]    Merrily Pew, MD 05/09/22 [phone]

## 2022-05-09 DIAGNOSIS — R69 Illness, unspecified: Secondary | ICD-10-CM | POA: Diagnosis not present

## 2022-05-09 DIAGNOSIS — I5023 Acute on chronic systolic (congestive) heart failure: Secondary | ICD-10-CM | POA: Diagnosis not present

## 2022-05-09 DIAGNOSIS — R17 Unspecified jaundice: Secondary | ICD-10-CM | POA: Diagnosis not present

## 2022-05-09 DIAGNOSIS — J811 Chronic pulmonary edema: Secondary | ICD-10-CM | POA: Diagnosis not present

## 2022-05-09 DIAGNOSIS — G4700 Insomnia, unspecified: Secondary | ICD-10-CM | POA: Diagnosis not present

## 2022-05-09 DIAGNOSIS — K7200 Acute and subacute hepatic failure without coma: Secondary | ICD-10-CM | POA: Diagnosis not present

## 2022-05-09 DIAGNOSIS — Y838 Other surgical procedures as the cause of abnormal reaction of the patient, or of later complication, without mention of misadventure at the time of the procedure: Secondary | ICD-10-CM | POA: Diagnosis not present

## 2022-05-09 DIAGNOSIS — N183 Chronic kidney disease, stage 3 unspecified: Secondary | ICD-10-CM | POA: Diagnosis not present

## 2022-05-09 DIAGNOSIS — I151 Hypertension secondary to other renal disorders: Secondary | ICD-10-CM | POA: Diagnosis not present

## 2022-05-09 DIAGNOSIS — Z66 Do not resuscitate: Secondary | ICD-10-CM | POA: Diagnosis not present

## 2022-05-09 DIAGNOSIS — Z9889 Other specified postprocedural states: Secondary | ICD-10-CM | POA: Diagnosis not present

## 2022-05-09 DIAGNOSIS — D649 Anemia, unspecified: Secondary | ICD-10-CM | POA: Diagnosis not present

## 2022-05-09 DIAGNOSIS — R4182 Altered mental status, unspecified: Secondary | ICD-10-CM | POA: Diagnosis not present

## 2022-05-09 DIAGNOSIS — I509 Heart failure, unspecified: Secondary | ICD-10-CM | POA: Diagnosis not present

## 2022-05-09 DIAGNOSIS — N179 Acute kidney failure, unspecified: Secondary | ICD-10-CM | POA: Diagnosis not present

## 2022-05-09 DIAGNOSIS — D849 Immunodeficiency, unspecified: Secondary | ICD-10-CM | POA: Diagnosis not present

## 2022-05-09 DIAGNOSIS — Z452 Encounter for adjustment and management of vascular access device: Secondary | ICD-10-CM | POA: Diagnosis not present

## 2022-05-09 DIAGNOSIS — E875 Hyperkalemia: Secondary | ICD-10-CM | POA: Diagnosis not present

## 2022-05-09 DIAGNOSIS — Z951 Presence of aortocoronary bypass graft: Secondary | ICD-10-CM | POA: Diagnosis not present

## 2022-05-09 DIAGNOSIS — I255 Ischemic cardiomyopathy: Secondary | ICD-10-CM | POA: Diagnosis not present

## 2022-05-09 DIAGNOSIS — E785 Hyperlipidemia, unspecified: Secondary | ICD-10-CM | POA: Diagnosis not present

## 2022-05-09 DIAGNOSIS — R109 Unspecified abdominal pain: Secondary | ICD-10-CM | POA: Diagnosis not present

## 2022-05-09 DIAGNOSIS — Z681 Body mass index (BMI) 19 or less, adult: Secondary | ICD-10-CM | POA: Diagnosis not present

## 2022-05-09 DIAGNOSIS — I130 Hypertensive heart and chronic kidney disease with heart failure and stage 1 through stage 4 chronic kidney disease, or unspecified chronic kidney disease: Secondary | ICD-10-CM | POA: Diagnosis not present

## 2022-05-09 DIAGNOSIS — E43 Unspecified severe protein-calorie malnutrition: Secondary | ICD-10-CM | POA: Diagnosis not present

## 2022-05-09 DIAGNOSIS — Z7901 Long term (current) use of anticoagulants: Secondary | ICD-10-CM | POA: Diagnosis not present

## 2022-05-09 DIAGNOSIS — D689 Coagulation defect, unspecified: Secondary | ICD-10-CM | POA: Diagnosis not present

## 2022-05-09 DIAGNOSIS — I5022 Chronic systolic (congestive) heart failure: Secondary | ICD-10-CM | POA: Diagnosis not present

## 2022-05-09 DIAGNOSIS — K718 Toxic liver disease with other disorders of liver: Secondary | ICD-10-CM | POA: Diagnosis not present

## 2022-05-09 DIAGNOSIS — D84821 Immunodeficiency due to drugs: Secondary | ICD-10-CM | POA: Diagnosis not present

## 2022-05-09 DIAGNOSIS — Z944 Liver transplant status: Secondary | ICD-10-CM | POA: Diagnosis not present

## 2022-05-09 DIAGNOSIS — I5020 Unspecified systolic (congestive) heart failure: Secondary | ICD-10-CM | POA: Diagnosis not present

## 2022-05-09 DIAGNOSIS — Z79899 Other long term (current) drug therapy: Secondary | ICD-10-CM | POA: Diagnosis not present

## 2022-05-09 DIAGNOSIS — N1832 Chronic kidney disease, stage 3b: Secondary | ICD-10-CM | POA: Diagnosis not present

## 2022-05-09 DIAGNOSIS — I201 Angina pectoris with documented spasm: Secondary | ICD-10-CM | POA: Diagnosis not present

## 2022-05-09 DIAGNOSIS — D696 Thrombocytopenia, unspecified: Secondary | ICD-10-CM | POA: Diagnosis not present

## 2022-05-09 DIAGNOSIS — E1122 Type 2 diabetes mellitus with diabetic chronic kidney disease: Secondary | ICD-10-CM | POA: Diagnosis not present

## 2022-05-09 DIAGNOSIS — E872 Acidosis, unspecified: Secondary | ICD-10-CM | POA: Diagnosis not present

## 2022-05-09 DIAGNOSIS — G9340 Encephalopathy, unspecified: Secondary | ICD-10-CM | POA: Diagnosis not present

## 2022-05-09 DIAGNOSIS — Z87891 Personal history of nicotine dependence: Secondary | ICD-10-CM | POA: Diagnosis not present

## 2022-05-09 DIAGNOSIS — N170 Acute kidney failure with tubular necrosis: Secondary | ICD-10-CM | POA: Diagnosis not present

## 2022-05-09 DIAGNOSIS — N200 Calculus of kidney: Secondary | ICD-10-CM | POA: Diagnosis not present

## 2022-05-09 DIAGNOSIS — N2889 Other specified disorders of kidney and ureter: Secondary | ICD-10-CM | POA: Diagnosis not present

## 2022-05-09 DIAGNOSIS — T8649 Other complications of liver transplant: Secondary | ICD-10-CM | POA: Diagnosis not present

## 2022-05-09 DIAGNOSIS — T405X1A Poisoning by cocaine, accidental (unintentional), initial encounter: Secondary | ICD-10-CM | POA: Diagnosis not present

## 2022-05-09 DIAGNOSIS — R1011 Right upper quadrant pain: Secondary | ICD-10-CM | POA: Diagnosis not present

## 2022-05-09 DIAGNOSIS — N189 Chronic kidney disease, unspecified: Secondary | ICD-10-CM | POA: Diagnosis not present

## 2022-05-09 DIAGNOSIS — I2510 Atherosclerotic heart disease of native coronary artery without angina pectoris: Secondary | ICD-10-CM | POA: Diagnosis not present

## 2022-05-09 DIAGNOSIS — I3139 Other pericardial effusion (noninflammatory): Secondary | ICD-10-CM | POA: Diagnosis not present

## 2022-05-09 DIAGNOSIS — R1030 Lower abdominal pain, unspecified: Secondary | ICD-10-CM | POA: Diagnosis not present

## 2022-05-09 DIAGNOSIS — Z7984 Long term (current) use of oral hypoglycemic drugs: Secondary | ICD-10-CM | POA: Diagnosis not present

## 2022-05-09 DIAGNOSIS — I4891 Unspecified atrial fibrillation: Secondary | ICD-10-CM | POA: Diagnosis not present

## 2022-05-09 LAB — HEPATITIS PANEL, ACUTE
HCV Ab: REACTIVE — AB
Hep A IgM: NONREACTIVE
Hep B C IgM: NONREACTIVE
Hepatitis B Surface Ag: NONREACTIVE

## 2022-05-11 LAB — TACROLIMUS LEVEL: Tacrolimus (FK506) - LabCorp: 1.7 ng/mL — ABNORMAL LOW (ref 2.0–20.0)

## 2022-05-14 DIAGNOSIS — D849 Immunodeficiency, unspecified: Secondary | ICD-10-CM | POA: Diagnosis not present

## 2022-05-14 DIAGNOSIS — E875 Hyperkalemia: Secondary | ICD-10-CM | POA: Diagnosis not present

## 2022-05-14 DIAGNOSIS — Z944 Liver transplant status: Secondary | ICD-10-CM | POA: Diagnosis not present

## 2022-05-14 DIAGNOSIS — I5020 Unspecified systolic (congestive) heart failure: Secondary | ICD-10-CM | POA: Diagnosis not present

## 2022-05-14 DIAGNOSIS — I151 Hypertension secondary to other renal disorders: Secondary | ICD-10-CM | POA: Diagnosis not present

## 2022-05-14 DIAGNOSIS — R4182 Altered mental status, unspecified: Secondary | ICD-10-CM | POA: Diagnosis not present

## 2022-05-14 DIAGNOSIS — N179 Acute kidney failure, unspecified: Secondary | ICD-10-CM | POA: Diagnosis not present

## 2022-05-17 DIAGNOSIS — I255 Ischemic cardiomyopathy: Secondary | ICD-10-CM | POA: Diagnosis not present

## 2022-05-17 DIAGNOSIS — E7800 Pure hypercholesterolemia, unspecified: Secondary | ICD-10-CM | POA: Diagnosis not present

## 2022-05-17 DIAGNOSIS — Z951 Presence of aortocoronary bypass graft: Secondary | ICD-10-CM | POA: Diagnosis not present

## 2022-05-17 DIAGNOSIS — E875 Hyperkalemia: Secondary | ICD-10-CM | POA: Diagnosis not present

## 2022-05-17 DIAGNOSIS — E872 Acidosis, unspecified: Secondary | ICD-10-CM | POA: Diagnosis not present

## 2022-05-17 DIAGNOSIS — R69 Illness, unspecified: Secondary | ICD-10-CM | POA: Diagnosis not present

## 2022-05-17 DIAGNOSIS — I5022 Chronic systolic (congestive) heart failure: Secondary | ICD-10-CM | POA: Diagnosis not present

## 2022-05-17 DIAGNOSIS — Z299 Encounter for prophylactic measures, unspecified: Secondary | ICD-10-CM | POA: Diagnosis not present

## 2022-05-17 DIAGNOSIS — Z87891 Personal history of nicotine dependence: Secondary | ICD-10-CM | POA: Diagnosis not present

## 2022-05-17 DIAGNOSIS — Z981 Arthrodesis status: Secondary | ICD-10-CM | POA: Diagnosis not present

## 2022-05-17 DIAGNOSIS — I480 Paroxysmal atrial fibrillation: Secondary | ICD-10-CM | POA: Diagnosis not present

## 2022-05-17 DIAGNOSIS — K7469 Other cirrhosis of liver: Secondary | ICD-10-CM | POA: Diagnosis not present

## 2022-05-17 DIAGNOSIS — I2510 Atherosclerotic heart disease of native coronary artery without angina pectoris: Secondary | ICD-10-CM | POA: Diagnosis not present

## 2022-05-17 DIAGNOSIS — D849 Immunodeficiency, unspecified: Secondary | ICD-10-CM | POA: Diagnosis not present

## 2022-05-17 DIAGNOSIS — Z85828 Personal history of other malignant neoplasm of skin: Secondary | ICD-10-CM | POA: Diagnosis not present

## 2022-05-17 DIAGNOSIS — I509 Heart failure, unspecified: Secondary | ICD-10-CM | POA: Diagnosis not present

## 2022-05-17 DIAGNOSIS — N1832 Chronic kidney disease, stage 3b: Secondary | ICD-10-CM | POA: Diagnosis not present

## 2022-05-17 DIAGNOSIS — E1122 Type 2 diabetes mellitus with diabetic chronic kidney disease: Secondary | ICD-10-CM | POA: Diagnosis not present

## 2022-05-17 DIAGNOSIS — Z944 Liver transplant status: Secondary | ICD-10-CM | POA: Diagnosis not present

## 2022-05-17 DIAGNOSIS — Z6822 Body mass index (BMI) 22.0-22.9, adult: Secondary | ICD-10-CM | POA: Diagnosis not present

## 2022-05-17 DIAGNOSIS — N179 Acute kidney failure, unspecified: Secondary | ICD-10-CM | POA: Diagnosis not present

## 2022-05-17 DIAGNOSIS — D631 Anemia in chronic kidney disease: Secondary | ICD-10-CM | POA: Diagnosis not present

## 2022-05-17 DIAGNOSIS — I130 Hypertensive heart and chronic kidney disease with heart failure and stage 1 through stage 4 chronic kidney disease, or unspecified chronic kidney disease: Secondary | ICD-10-CM | POA: Diagnosis not present

## 2022-05-18 ENCOUNTER — Encounter: Payer: Self-pay | Admitting: Cardiology

## 2022-05-21 DIAGNOSIS — I130 Hypertensive heart and chronic kidney disease with heart failure and stage 1 through stage 4 chronic kidney disease, or unspecified chronic kidney disease: Secondary | ICD-10-CM | POA: Diagnosis not present

## 2022-05-21 DIAGNOSIS — I5022 Chronic systolic (congestive) heart failure: Secondary | ICD-10-CM | POA: Diagnosis not present

## 2022-05-21 DIAGNOSIS — D8481 Immunodeficiency due to conditions classified elsewhere: Secondary | ICD-10-CM | POA: Diagnosis not present

## 2022-05-21 DIAGNOSIS — E1122 Type 2 diabetes mellitus with diabetic chronic kidney disease: Secondary | ICD-10-CM | POA: Diagnosis not present

## 2022-05-22 ENCOUNTER — Telehealth (HOSPITAL_COMMUNITY): Payer: Self-pay

## 2022-05-22 ENCOUNTER — Encounter (HOSPITAL_COMMUNITY): Payer: Self-pay

## 2022-05-22 ENCOUNTER — Ambulatory Visit (HOSPITAL_COMMUNITY)
Admission: RE | Admit: 2022-05-22 | Discharge: 2022-05-22 | Disposition: A | Discharge status: Home / Self Care | Payer: Medicare HMO | Source: Ambulatory Visit | Attending: Family Medicine | Admitting: Family Medicine

## 2022-05-22 VITALS — BP 120/80 | HR 92 | Wt 150.0 lb

## 2022-05-22 DIAGNOSIS — E1122 Type 2 diabetes mellitus with diabetic chronic kidney disease: Secondary | ICD-10-CM | POA: Diagnosis not present

## 2022-05-22 DIAGNOSIS — F1490 Cocaine use, unspecified, uncomplicated: Secondary | ICD-10-CM | POA: Diagnosis not present

## 2022-05-22 DIAGNOSIS — Z79899 Other long term (current) drug therapy: Secondary | ICD-10-CM | POA: Insufficient documentation

## 2022-05-22 DIAGNOSIS — I130 Hypertensive heart and chronic kidney disease with heart failure and stage 1 through stage 4 chronic kidney disease, or unspecified chronic kidney disease: Secondary | ICD-10-CM | POA: Insufficient documentation

## 2022-05-22 DIAGNOSIS — Z944 Liver transplant status: Secondary | ICD-10-CM | POA: Diagnosis not present

## 2022-05-22 DIAGNOSIS — Z7984 Long term (current) use of oral hypoglycemic drugs: Secondary | ICD-10-CM | POA: Insufficient documentation

## 2022-05-22 DIAGNOSIS — I255 Ischemic cardiomyopathy: Secondary | ICD-10-CM

## 2022-05-22 DIAGNOSIS — I252 Old myocardial infarction: Secondary | ICD-10-CM | POA: Insufficient documentation

## 2022-05-22 DIAGNOSIS — I480 Paroxysmal atrial fibrillation: Secondary | ICD-10-CM | POA: Diagnosis not present

## 2022-05-22 DIAGNOSIS — Z79621 Long term (current) use of calcineurin inhibitor: Secondary | ICD-10-CM | POA: Diagnosis not present

## 2022-05-22 DIAGNOSIS — I2510 Atherosclerotic heart disease of native coronary artery without angina pectoris: Secondary | ICD-10-CM | POA: Diagnosis not present

## 2022-05-22 DIAGNOSIS — N183 Chronic kidney disease, stage 3 unspecified: Secondary | ICD-10-CM | POA: Insufficient documentation

## 2022-05-22 DIAGNOSIS — I5022 Chronic systolic (congestive) heart failure: Secondary | ICD-10-CM | POA: Diagnosis not present

## 2022-05-22 DIAGNOSIS — Z951 Presence of aortocoronary bypass graft: Secondary | ICD-10-CM | POA: Insufficient documentation

## 2022-05-22 DIAGNOSIS — N1832 Chronic kidney disease, stage 3b: Secondary | ICD-10-CM | POA: Diagnosis not present

## 2022-05-22 DIAGNOSIS — R0609 Other forms of dyspnea: Secondary | ICD-10-CM | POA: Diagnosis not present

## 2022-05-22 DIAGNOSIS — I5082 Biventricular heart failure: Secondary | ICD-10-CM | POA: Diagnosis not present

## 2022-05-22 DIAGNOSIS — R69 Illness, unspecified: Secondary | ICD-10-CM | POA: Diagnosis not present

## 2022-05-22 LAB — HEPATIC FUNCTION PANEL
ALT: 141 U/L — ABNORMAL HIGH (ref 0–44)
AST: 33 U/L (ref 15–41)
Albumin: 3.3 g/dL — ABNORMAL LOW (ref 3.5–5.0)
Alkaline Phosphatase: 67 U/L (ref 38–126)
Bilirubin, Direct: 0.2 mg/dL (ref 0.0–0.2)
Indirect Bilirubin: 0.6 mg/dL (ref 0.3–0.9)
Total Bilirubin: 0.8 mg/dL (ref 0.3–1.2)
Total Protein: 5.9 g/dL — ABNORMAL LOW (ref 6.5–8.1)

## 2022-05-22 LAB — CBC WITH DIFFERENTIAL/PLATELET
Abs Immature Granulocytes: 0.02 10*3/uL (ref 0.00–0.07)
Basophils Absolute: 0 10*3/uL (ref 0.0–0.1)
Basophils Relative: 1 %
Eosinophils Absolute: 0.1 10*3/uL (ref 0.0–0.5)
Eosinophils Relative: 3 %
HCT: 26.1 % — ABNORMAL LOW (ref 39.0–52.0)
Hemoglobin: 7.8 g/dL — ABNORMAL LOW (ref 13.0–17.0)
Immature Granulocytes: 0 %
Lymphocytes Relative: 15 %
Lymphs Abs: 0.8 10*3/uL (ref 0.7–4.0)
MCH: 26 pg (ref 26.0–34.0)
MCHC: 29.9 g/dL — ABNORMAL LOW (ref 30.0–36.0)
MCV: 87 fL (ref 80.0–100.0)
Monocytes Absolute: 0.6 10*3/uL (ref 0.1–1.0)
Monocytes Relative: 12 %
Neutro Abs: 3.5 10*3/uL (ref 1.7–7.7)
Neutrophils Relative %: 69 %
Platelets: 159 10*3/uL (ref 150–400)
RBC: 3 MIL/uL — ABNORMAL LOW (ref 4.22–5.81)
RDW: 18.2 % — ABNORMAL HIGH (ref 11.5–15.5)
WBC: 5.2 10*3/uL (ref 4.0–10.5)
nRBC: 0 % (ref 0.0–0.2)

## 2022-05-22 LAB — BASIC METABOLIC PANEL
Anion gap: 4 — ABNORMAL LOW (ref 5–15)
BUN: 30 mg/dL — ABNORMAL HIGH (ref 8–23)
CO2: 24 mmol/L (ref 22–32)
Calcium: 8.4 mg/dL — ABNORMAL LOW (ref 8.9–10.3)
Chloride: 113 mmol/L — ABNORMAL HIGH (ref 98–111)
Creatinine, Ser: 2.04 mg/dL — ABNORMAL HIGH (ref 0.61–1.24)
GFR, Estimated: 36 mL/min — ABNORMAL LOW (ref >60)
Glucose, Bld: 63 mg/dL — ABNORMAL LOW (ref 70–99)
Potassium: 4.5 mmol/L (ref 3.5–5.1)
Sodium: 141 mmol/L (ref 135–145)

## 2022-05-22 NOTE — Patient Instructions (Signed)
Medication Changes:  Restart Eliquis '5mg'$  1 tablet twice daily. Restart Entresto 24/26 1 tablet twice daily.   Lab Work:  Labs done today, your results will be available in MyChart, we will contact you for abnormal readings.   Testing/Procedures:  N/A  Referrals:  N/A  Special Instructions // Education:  N/A  Follow-Up in: Follow up as scheduled in January.  At the [facility], you and your health needs are our priority. We have a designated team specialized in the treatment of Heart Failure. This Care Team includes your primary Heart Failure Specialized Cardiologist (physician), Advanced Practice Providers (APPs- Physician Assistants and Nurse Practitioners), and Pharmacist who all work together to provide you with the care you need, when you need it.   You may see any of the following providers on your designated Care Team at your next follow up:  Dr. Glori Bickers Dr. Loralie Champagne Dr. Roxana Hires, NP Lyda Jester, [place] [facility] [place], [place] Forestine Na, NP Audry Riles, PharmD   Please be sure to bring in all your medications bottles to every appointment.   Need to Contact us:  If you have any questions or concerns before your next appointment please send [place] a message through [place] or call our office at [phone].    TO LEAVE A MESSAGE FOR THE NURSE SELECT OPTION 2, PLEASE LEAVE A MESSAGE INCLUDING: YOUR NAME DATE OF BIRTH CALL BACK NUMBER REASON FOR CALL**this is important as we prioritize the call backs  YOU WILL RECEIVE A CALL BACK THE SAME DAY AS LONG AS YOU CALL BEFORE 4:00 PM

## 2022-05-22 NOTE — Progress Notes (Signed)
Advanced Heart Failure Clinic Note   PCP: Monico Blitz, MD HF Cardiology: Dr. [name]   HPI: 66 y.o. male w/ h/o HCV s/p liver transplant in 2009 (followed at [facility]), Stage III CKD, HTN and T2DM.   He was admitted 7/21 w/ NSTEMI and Afib w/ RVR, found to have severe 3VD and biventricular heart failure, LVEF 20-25%, RV moderately reduced. Underwent CABG + MAZE + LA appendage clipping 12/19/19. Required milrinone post operatively. Post op course complicated by ATN/ renal failure. Required CVVHD but remained anuric. Tunneled HD cath placed and transitioned to iHD, MWF.  He was able to transition off HD as an outpatient.   Repeat echo done in 10/21 with EF 20-25%, moderate LV dilation, mild-moderately decreased RV systolic function, moderate MR.   Echo in 4/22 showed EF 30%, mid anterior and inferior severe hypokinesis, basal to mid anterolateral and inferolateral akinesis, mildly decreased RV systolic function.   He was recently admitted to [facility] 12/05-12/10/23 with AKI and acute ischemic liver injury in setting of cocaine use. Suspected ischemic injury 2/2 hypoperfusion from coronary vasospasm. AST/ALT peaked to 4,000s and Scr up to 8.1. He required CRRT briefly (12/05-12/06). Scr improved to 2.3 (near baseline).  Echo during admit with EF 20%, mildly reduced RV, mild AI, mild MR, mild TR. GDMT held during admit but resumed at discharge.  Patient here today for hospital follow-up. Reports he was instructed to stop all GDMT at hospital discharge. Not taking coreg, entresto, spiro, farxiga. Also off eliquis and statin. His home weight has been around 150 lb which is his recent dry weight. He would like to gain more weight but has a hard time eating enough calories. Has been utilizing supplement shakes. Has not used cocaine since discharge. He works at a [employer] and is planning to start exercising again. Notes a little bit of dyspnea with exertion but no orthopnea, PND or LE edema.    ECG (personally  reviewed): SR 88 bpm, PACs  Labs (9/21): LDL 66, HDL 44 Labs (10/21): K 3.9, creatinine 2.02 Labs (12/21): K 3.7, creatinine 1.58 Labs (9/21): LDL 66 Labs (1/22): K 4.1, creatinine 1.6 Labs (3/22): K 4.6, creatinine 1.56 => 2.09 Labs (4/22): LDL 66 Labs (8/22): K 5.5, creatinine 1.92 Labs (2/23): K 6.2, creatinine 2.36 Labs (6/23): K 5.2, creatinine 2.12  Review of systems complete and found to be negative unless listed in HPI.    PMH: 1. CKD stage III: He was on HD after CABG in 7/21 but renal function recovered.  2. HCV with cirrhosis, s/p liver transplant in 2009.  3. HTN 4. Psoriasis 5. Type 2 diabetes 6. Hyperlipidemia 7. CAD: NSTEMI 7/21.  - CABG (7/21) with LIMA-LAD, RIMA-ramus, sequential SVG-PDA and PLV, SVG -D 8. Atrial fibrillation: Paroxysmal.  - S/p Maze and LA appendage clip with CABG in 7/21.  9. Chronic systolic CHF: Ischemic cardiomyopathy.  - Echo (10/21): EF 20-25%, moderate LV dilation, mild-moderately decreased RV systolic function, moderate MR.  - Echo (4/22): EF 30%, mid anterior and inferior severe hypokinesis, basal to mid anterolateral and inferolateral akinesis, mildly decreased RV systolic function. - Echo (12/23) at [facility]: EF 20%, LV RWMA, mildly reduced RV, mild MR, mild AI, mild TR  Current Outpatient Medications  Medication Sig Dispense Refill   FLUoxetine (PROZAC) 40 MG capsule Take 20 mg by mouth daily.     LORazepam (ATIVAN) 1 MG tablet Take 1 mg by mouth 2 (two) times daily as needed (panic attacks).      tacrolimus (PROGRAF)  1 MG capsule Take 2 tabs by mouth every morning & 1 tab every evening     Apremilast (OTEZLA) 30 MG TABS Take 1 tablet by mouth 2 (two) times daily. (Patient not taking: Reported on 05/22/2022)     atorvastatin (LIPITOR) 80 MG tablet Take 1 tablet (80 mg total) by mouth daily. (Patient not taking: Reported on 05/22/2022) 90 tablet 3   carvedilol (COREG) 6.25 MG tablet Take 1.5 tablets (9.375 mg total) by mouth 2 (two)  times daily with a meal. (Patient not taking: Reported on 05/22/2022) 90 tablet 11   dapagliflozin propanediol (FARXIGA) 10 MG TABS tablet Take 1 tablet (10 mg total) by mouth daily before breakfast. (Patient not taking: Reported on 05/22/2022) 90 tablet 3   ELIQUIS 5 MG TABS tablet Take 1 tablet by mouth twice daily (Patient not taking: Reported on 05/22/2022) 60 tablet 6   sacubitril-valsartan (ENTRESTO) 24-26 MG Take 1 tablet by mouth 2 (two) times daily. (Patient not taking: Reported on 05/22/2022) 180 tablet 3   sildenafil (VIAGRA) 50 MG tablet TAKE 1 TABLET BY MOUTH AS NEEDED FOR ERECTILE DYSFUNCTION (Patient not taking: Reported on 05/22/2022) 20 tablet 0   spironolactone (ALDACTONE) 25 MG tablet Take 12.5 mg by mouth daily. (Patient not taking: Reported on 05/22/2022)     triamcinolone cream (KENALOG) 0.1 % Apply 1 application topically 2 (two) times daily. (Patient not taking: Reported on 05/22/2022)     No current facility-administered medications for this encounter.   Allergies  Allergen Reactions   Morphine And Related Nausea And Vomiting   Social History   Socioeconomic History   Marital status: Widowed    Spouse name: Not on file   Number of children: Not on file   Years of education: Not on file   Highest education level: Not on file  Occupational History   Not on file  Tobacco Use   Smoking status: Former    Packs/day: 1.00    Types: Cigarettes    Quit date: 09/04/2007    Years since quitting: 14.7   Smokeless tobacco: Never  Vaping Use   Vaping Use: Never used  Substance and Sexual Activity   Alcohol use: No   Drug use: No   Sexual activity: Yes    Birth control/protection: None  Other Topics Concern   Not on file  Social History Narrative   Not on file   Social Determinants of Health   Financial Resource Strain: Not on file  Food Insecurity: No Food Insecurity (01/05/2020)   Hunger Vital Sign    Worried About Running Out of Food in the Last Year: Never  true    Gray in the Last Year: Never true  Transportation Needs: No Transportation Needs (01/05/2020)   PRAPARE - Hydrologist (Medical): No    Lack of Transportation (Non-Medical): No  Physical Activity: Not on file  Stress: Not on file  Social Connections: Not on file  Intimate Partner Violence: Not on file   Family History  Problem Relation Age of Onset   Diabetes Mother    Heart disease Father    Heart disease Son    BP 120/80   Pulse 92   Wt 68 kg (150 lb)   SpO2 99%   BMI 20.34 kg/m   Wt Readings from Last 3 Encounters:  05/22/22 68 kg (150 lb)  05/08/22 79.4 kg (175 lb)  02/13/22 69.9 kg (154 lb)   PHYSICAL EXAM: General:  Thin,  well-appearing. No distress. HEENT: normal Neck: supple. no JVD. Carotids 2+ bilat; no bruits. appreciated. Cor: PMI nondisplaced. Regular rate & rhythm. No rubs, gallops or murmurs. Lungs: clear Abdomen: soft, nontender, nondistended.  Extremities: no cyanosis, clubbing, rash, edema Neuro: alert & orientedx3, cranial nerves grossly intact. Affect pleasant.   ASSESSMENT & PLAN: 1. CAD: NSTEMI in 7/21.  Suspect out of hospital inferolateral MI.  Cardiac MRI showed that the inferolateral wall was likely not viable, but the remainder of the LV myocardium was likely viable. He had CABG 12/19/19 with LIMA-LAD, RIMA-ramus, seq SVG-PD/PL, SVG-D.  No chest pain.  - No ASA given Eliquis use.  - Consider resuming Atorvastatin at future visit if LFTs remain stable. Check hepatic function panel. 2. Chronic Systolic CHF: Ischemic cardiomyopathy.  Echo 7/21 with EF 20-25%, moderately decreased RV systolic function. Cardiac MRI with LV EF 26%, RV EF 38%, near full thickness scar in inferolateral wall suggesting lack of viability in the LCx territory.  Now post-CABG.  Echo in 10/21 showed EF 20-25%, moderate LV dilation, mild-moderately decreased RV systolic function, moderate MR.  Echo in 4/22 showed EF 30%, mid anterior  and inferior severe hypokinesis, basal to mid anterolateral and inferolateral akinesis, mildly decreased RV systolic function.  Most recent echo at [facility] 12/23 with EF 20%, regional WMA in LV, mildly reduced RV. NYHA class I-II symptoms.  He is not volume overloaded.    - He has been off all GDMT since hospital discharge (discharge summary indicates resuming all meds but patient was instructed to stop all meds). Had been on coreg 9.375 BID, entresto 24/26 BID, farxiga 10 mg daily, spiro 12.5 mg daily. - Will add back entresto 24/26 mg BID - Check BMET - Consider adding back farxiga and spiro next - He does not appear to need Lasix.    - He has decided that he does not want an ICD.   - Recommended he abstain from cocaine use 3. Atrial fibrillation:  S/p Maze + LAA clipping on 7/16.  He is off amiodarone.  He is in NSR today.  - Resume Eliquis 5 mg BID - Check CBC today 4. CKD: Stage 3.  He required HD after CABG but renal function recovered. Scr baseline has been in low 2s - Recent admit with AKI suspected d/t ischemic injury in setting of cocaine use. Briefly required CRRT. - Check BMET today 5. Liver transplant for HCV: Followed at [facility]. On Tacrolimus.  - Recent liver injury in setting of cocaine use - Hepatic function panel today 6. Cocaine abuse: Complete cessation recommended - He has not used since discharge  Follow up in January 2024 as scheduled, add back GDMT at that time  [facility], Lynder Parents, PA-C 05/22/22

## 2022-05-22 NOTE — Telephone Encounter (Signed)
Patient aware and agreeable to information.

## 2022-05-24 ENCOUNTER — Encounter: Payer: Self-pay | Admitting: Cardiology

## 2022-05-24 ENCOUNTER — Encounter (HOSPITAL_COMMUNITY): Payer: Self-pay

## 2022-05-24 ENCOUNTER — Emergency Department (HOSPITAL_COMMUNITY)
Admission: EM | Admit: 2022-05-24 | Discharge: 2022-05-25 | Disposition: A | Discharge status: Home / Self Care | Payer: Medicare HMO | Attending: Emergency Medicine | Admitting: Emergency Medicine

## 2022-05-24 ENCOUNTER — Other Ambulatory Visit: Payer: Self-pay

## 2022-05-24 ENCOUNTER — Encounter (INDEPENDENT_AMBULATORY_CARE_PROVIDER_SITE_OTHER): Payer: Self-pay | Admitting: *Deleted

## 2022-05-24 ENCOUNTER — Emergency Department (HOSPITAL_COMMUNITY): Payer: Medicare HMO

## 2022-05-24 DIAGNOSIS — N289 Disorder of kidney and ureter, unspecified: Secondary | ICD-10-CM | POA: Diagnosis not present

## 2022-05-24 DIAGNOSIS — Z7984 Long term (current) use of oral hypoglycemic drugs: Secondary | ICD-10-CM | POA: Diagnosis not present

## 2022-05-24 DIAGNOSIS — Z7901 Long term (current) use of anticoagulants: Secondary | ICD-10-CM | POA: Diagnosis not present

## 2022-05-24 DIAGNOSIS — R7989 Other specified abnormal findings of blood chemistry: Secondary | ICD-10-CM | POA: Diagnosis not present

## 2022-05-24 DIAGNOSIS — I509 Heart failure, unspecified: Secondary | ICD-10-CM | POA: Diagnosis not present

## 2022-05-24 DIAGNOSIS — R0602 Shortness of breath: Secondary | ICD-10-CM | POA: Diagnosis not present

## 2022-05-24 DIAGNOSIS — N189 Chronic kidney disease, unspecified: Secondary | ICD-10-CM | POA: Diagnosis not present

## 2022-05-24 DIAGNOSIS — D649 Anemia, unspecified: Secondary | ICD-10-CM | POA: Diagnosis not present

## 2022-05-24 DIAGNOSIS — I130 Hypertensive heart and chronic kidney disease with heart failure and stage 1 through stage 4 chronic kidney disease, or unspecified chronic kidney disease: Secondary | ICD-10-CM | POA: Diagnosis not present

## 2022-05-24 DIAGNOSIS — I2510 Atherosclerotic heart disease of native coronary artery without angina pectoris: Secondary | ICD-10-CM | POA: Diagnosis not present

## 2022-05-24 DIAGNOSIS — E1122 Type 2 diabetes mellitus with diabetic chronic kidney disease: Secondary | ICD-10-CM | POA: Diagnosis not present

## 2022-05-24 DIAGNOSIS — D696 Thrombocytopenia, unspecified: Secondary | ICD-10-CM | POA: Insufficient documentation

## 2022-05-24 DIAGNOSIS — Z1152 Encounter for screening for COVID-19: Secondary | ICD-10-CM | POA: Diagnosis not present

## 2022-05-24 DIAGNOSIS — Z79899 Other long term (current) drug therapy: Secondary | ICD-10-CM | POA: Diagnosis not present

## 2022-05-24 DIAGNOSIS — J101 Influenza due to other identified influenza virus with other respiratory manifestations: Secondary | ICD-10-CM | POA: Insufficient documentation

## 2022-05-24 LAB — RESP PANEL BY RT-PCR (RSV, FLU A&B, COVID)  RVPGX2
Influenza A by PCR: POSITIVE — AB
Influenza B by PCR: NEGATIVE
Resp Syncytial Virus by PCR: NEGATIVE
SARS Coronavirus 2 by RT PCR: NEGATIVE

## 2022-05-24 LAB — CBC
HCT: 25 % — ABNORMAL LOW (ref 39.0–52.0)
Hemoglobin: 7.7 g/dL — ABNORMAL LOW (ref 13.0–17.0)
MCH: 25.7 pg — ABNORMAL LOW (ref 26.0–34.0)
MCHC: 30.8 g/dL (ref 30.0–36.0)
MCV: 83.3 fL (ref 80.0–100.0)
Platelets: 144 10*3/uL — ABNORMAL LOW (ref 150–400)
RBC: 3 MIL/uL — ABNORMAL LOW (ref 4.22–5.81)
RDW: 18.6 % — ABNORMAL HIGH (ref 11.5–15.5)
WBC: 3.8 10*3/uL — ABNORMAL LOW (ref 4.0–10.5)
nRBC: 0 % (ref 0.0–0.2)

## 2022-05-24 LAB — BASIC METABOLIC PANEL
Anion gap: 6 (ref 5–15)
BUN: 30 mg/dL — ABNORMAL HIGH (ref 8–23)
CO2: 23 mmol/L (ref 22–32)
Calcium: 8.3 mg/dL — ABNORMAL LOW (ref 8.9–10.3)
Chloride: 109 mmol/L (ref 98–111)
Creatinine, Ser: 1.99 mg/dL — ABNORMAL HIGH (ref 0.61–1.24)
GFR, Estimated: 37 mL/min — ABNORMAL LOW (ref >60)
Glucose, Bld: 100 mg/dL — ABNORMAL HIGH (ref 70–99)
Potassium: 4.8 mmol/L (ref 3.5–5.1)
Sodium: 138 mmol/L (ref 135–145)

## 2022-05-24 LAB — TROPONIN I (HIGH SENSITIVITY): Troponin I (High Sensitivity): 54 ng/L — ABNORMAL HIGH (ref <18)

## 2022-05-24 NOTE — ED Triage Notes (Addendum)
Pt presents with sudden onset of ShOB and orthopnea tonight when he tried to lay down. Pt denies any CP. States symptoms similar to a CHF exacerbation that he had a week ago. Pt states recently [facility] told him to discontinue all of his medication including his fluid pills. Pt weighs daily and does not report any weight gain and is at his baseline of 150 lbs.

## 2022-05-25 ENCOUNTER — Encounter: Payer: Self-pay | Admitting: Cardiology

## 2022-05-25 LAB — BRAIN NATRIURETIC PEPTIDE: B Natriuretic Peptide: 3027 pg/mL — ABNORMAL HIGH (ref 0.0–100.0)

## 2022-05-25 LAB — TROPONIN I (HIGH SENSITIVITY): Troponin I (High Sensitivity): 55 ng/L — ABNORMAL HIGH (ref <18)

## 2022-05-25 MED ORDER — OSELTAMIVIR PHOSPHATE 75 MG PO CAPS
75.0000 mg | ORAL_CAPSULE | Freq: Once | ORAL | Status: AC
  Administered 2022-05-25: 75 mg via ORAL
  Filled 2022-05-25: qty 1

## 2022-05-25 MED ORDER — OSELTAMIVIR PHOSPHATE 30 MG PO CAPS: 30.0000 mg | ORAL_CAPSULE | Freq: Two times a day (BID) | ORAL | 0 refills | Status: DC

## 2022-05-25 NOTE — ED Notes (Signed)
Pt ambulated around nursing desk and maintained a pulse of 108 with o2 sat of 93%

## 2022-05-25 NOTE — ED Provider Notes (Signed)
[facility] EMERGENCY DEPARTMENT Provider Note   CSN: [id number] Arrival date & time: 05/24/22  2036     History  Chief Complaint  Patient presents with   Shortness of Breath    Joseph Greer is a 66 y.o. male.  The history is provided by the patient.  Shortness of Breath He has history of hypertension, diabetes, chronic kidney disease, liver transplant, coronary artery disease, heart failure and comes in because of shortness of breath when he lays flat.  That started this evening.  He has had some chills this morning and a slight cough which is nonproductive.  He denies fever.  He denies chest pain, heaviness, tightness, pressure.  He denies nausea, vomiting, diaphoresis.  He is concerned because had a recent hospitalization at [facility], all of his medications were stopped and he is asking me to restart some of his heart medications.   Home Medications Prior to Admission medications   Medication Sig Start Date End Date Taking? Authorizing Provider  Apremilast (OTEZLA) 30 MG TABS Take 1 tablet by mouth 2 (two) times daily. Patient not taking: Reported on 05/22/2022 06/16/19   [provider]  atorvastatin (LIPITOR) 80 MG tablet Take 1 tablet (80 mg total) by mouth daily. Patient not taking: Reported on 05/22/2022 08/26/21 08/26/22  Larey Dresser, MD  carvedilol (COREG) 6.25 MG tablet Take 1.5 tablets (9.375 mg total) by mouth 2 (two) times daily with a meal. Patient not taking: Reported on 05/22/2022 01/06/22   Larey Dresser, MD  dapagliflozin propanediol (FARXIGA) 10 MG TABS tablet Take 1 tablet (10 mg total) by mouth daily before breakfast. Patient not taking: Reported on 05/22/2022 08/29/21   Larey Dresser, MD  ELIQUIS 5 MG TABS tablet Take 1 tablet by mouth twice daily Patient not taking: Reported on 05/22/2022 08/23/20   Verta Ellen., NP  FLUoxetine (PROZAC) 40 MG capsule Take 20 mg by mouth daily. 01/29/22   [provider]  LORazepam  (ATIVAN) 1 MG tablet Take 1 mg by mouth 2 (two) times daily as needed (panic attacks).  11/28/19   [provider]  sacubitril-valsartan (ENTRESTO) 24-26 MG Take 1 tablet by mouth 2 (two) times daily. Patient not taking: Reported on 05/22/2022 08/29/21   Larey Dresser, MD  sildenafil (VIAGRA) 50 MG tablet TAKE 1 TABLET BY MOUTH AS NEEDED FOR ERECTILE DYSFUNCTION Patient not taking: Reported on 05/22/2022 02/27/22   Larey Dresser, MD  spironolactone (ALDACTONE) 25 MG tablet Take 12.5 mg by mouth daily. Patient not taking: Reported on 05/22/2022    [provider]  tacrolimus (PROGRAF) 1 MG capsule Take 2 tabs by mouth every morning & 1 tab every evening    [provider]  triamcinolone cream (KENALOG) 0.1 % Apply 1 application topically 2 (two) times daily. Patient not taking: Reported on 05/22/2022    [provider]      Allergies    Morphine and related    Review of Systems   Review of Systems  Respiratory:  Positive for shortness of breath.   All other systems reviewed and are negative.   Physical Exam Updated Vital Signs BP 118/71   Pulse 99   Temp 99.8 F (37.7 C) (Oral)   Resp (!) 23   Ht '5\' 7"'$  (1.702 m)   Wt 68 kg   SpO2 94%   BMI 23.49 kg/m  Physical Exam Vitals and nursing note reviewed.   66 year old male, resting comfortably and in  no acute distress. Vital signs are significant for slightly elevated respiratory rate. Oxygen saturation is 94%, which is normal. Head is normocephalic and atraumatic. PERRLA, EOMI. Oropharynx is clear. Neck is nontender and supple without adenopathy or JVD. Back is nontender and there is no CVA tenderness. Lungs have scattered rales and rhonchi. Chest is nontender. Heart has regular rate and rhythm without murmur. Abdomen is soft, flat, nontender. Extremities have no cyanosis or edema, full range of motion is present. Skin is warm and dry without rash. Neurologic: Mental status is normal,  cranial nerves are intact, moves all extremities equally.  ED Results / Procedures / Treatments   Labs (all labs ordered are listed, but only abnormal results are displayed) Labs Reviewed  RESP PANEL BY RT-PCR (RSV, FLU A&B, COVID)  RVPGX2 - Abnormal; Notable for the following components:      Result Value   Influenza A by PCR POSITIVE (*)    All other components within normal limits  BASIC METABOLIC PANEL - Abnormal; Notable for the following components:   Glucose, Bld 100 (*)    BUN 30 (*)    Creatinine, Ser 1.99 (*)    Calcium 8.3 (*)    GFR, Estimated 37 (*)    All other components within normal limits  CBC - Abnormal; Notable for the following components:   WBC 3.8 (*)    RBC 3.00 (*)    Hemoglobin 7.7 (*)    HCT 25.0 (*)    MCH 25.7 (*)    RDW 18.6 (*)    Platelets 144 (*)    All other components within normal limits  TROPONIN I (HIGH SENSITIVITY) - Abnormal; Notable for the following components:   Troponin I (High Sensitivity) 54 (*)    All other components within normal limits  TROPONIN I (HIGH SENSITIVITY) - Abnormal; Notable for the following components:   Troponin I (High Sensitivity) 55 (*)    All other components within normal limits  BRAIN NATRIURETIC PEPTIDE    EKG EKG Interpretation  Date/Time:  Wednesday May 24 2022 21:25:44 EST Ventricular Rate:  100 PR Interval:  154 QRS Duration: 124 QT Interval:  366 QTC Calculation: 472 R Axis:   9 Text Interpretation: Normal sinus rhythm Non-specific intra-ventricular conduction delay Minimal voltage criteria for LVH, may be normal variant ( Cornell product ) ST & T wave abnormality, consider inferolateral ischemia Abnormal ECG When compared with ECG of 22-May-2022 10:29, Premature supraventricular complexes are no longer Present Confirmed by Milton Ferguson [phone]) on 05/24/2022 9:48:00 PM  Radiology DG Chest Port 1 View  Result Date: 05/24/2022 CLINICAL DATA:  Shortness of breath. EXAM: PORTABLE CHEST 1  VIEW COMPARISON:  05/08/2022. FINDINGS: Heart is enlarged and the mediastinal contour is stable. There is evidence of prior cardiothoracic surgery. Mild atelectasis or scarring is noted at the lung bases. No consolidation, effusion, or pneumothorax. No acute osseous abnormality. IMPRESSION: 1. Cardiomegaly. 2. Mild atelectasis or scarring at the lung bases. Electronically Signed   By: Brett Fairy M.D.   On: 05/24/2022 22:20    Procedures Procedures  Cardiac monitor shows normal sinus rhythm, per my interpretation.  Medications Ordered in ED Medications  oseltamivir (TAMIFLU) capsule 75 mg (75 mg Oral Given 05/25/22 0057)    ED Course/ Medical Decision Making/ A&P                           Medical Decision Making Amount and/or Complexity of Data Reviewed Labs: ordered.  Risk Prescription drug management.   Orthopnea and patient with known history of heart failure concerning for heart failure exacerbation.  However, no physical findings.  Differential diagnosis includes pneumonia, respiratory tract infection.  Chest x-ray shows cardiomegaly but no evidence of pulmonary edema or pneumonia.  I have independently viewed the image, and agree with radiologist's interpretation.  I have reviewed and interpreted his electrocardiogram, and my interpretation is LVH but no acute ST or T changes.  I have reviewed and interpreted his laboratory test, and my interpretation is stable renal insufficiency, stable anemia, minimal thrombocytopenia with platelet count essentially unchanged from recent, mild elevation of troponin which is stable.  On review of past records, it is noted that troponin was over 700 on 05/08/2022.  Troponin today may be chronic elevation and may just be slow decline from prior non-STEMI.  Respiratory pathogen panel is positive for influenza A, negative for COVID-19 and RSV.  Given his status as a transplant patient, antiviral medication is indicated and I have ordered a dose of  oseltamivir.  I have ordered a BNP to evaluate for heart failure exacerbation.  I have reviewed his past records, and echocardiogram on 09/28/2020 showed ejection fraction of 30% and grade 2 diastolic dysfunction.  BNP is significantly elevated at 3027, but this is actually a decrease compared with most recent value on 05/08/2022 of greater than 4500.  I do not believe that heart failure is the cause of his dyspnea, I believe that it is influenza.  Patient was ambulated in the emergency department without any oxygen desaturation, he is felt to be safe for discharge.  I am discharging him with a prescription for oseltamivir, patient is given strict return precautions for any worsening symptoms.  Final Clinical Impression(s) / ED Diagnoses Final diagnoses:  Influenza A  Renal insufficiency  Normochromic normocytic anemia  Thrombocytopenia (HCC)  Elevated troponin  Elevated brain natriuretic peptide (BNP) level    Rx / DC Orders ED Discharge Orders          Ordered    oseltamivir (TAMIFLU) 30 MG capsule  Every 12 hours        05/25/22 3893              Delora Fuel, MD 73/42/87 [id number]

## 2022-05-25 NOTE — Discharge Instructions (Signed)
He may take acetaminophen as needed for fever or aching.  Return to the emergency department if breathing is getting worse, or you have any other new or concerning symptoms.

## 2022-05-30 ENCOUNTER — Other Ambulatory Visit (HOSPITAL_COMMUNITY): Payer: Self-pay

## 2022-05-30 DIAGNOSIS — Z299 Encounter for prophylactic measures, unspecified: Secondary | ICD-10-CM | POA: Diagnosis not present

## 2022-05-30 DIAGNOSIS — R42 Dizziness and giddiness: Secondary | ICD-10-CM | POA: Diagnosis not present

## 2022-05-30 DIAGNOSIS — I509 Heart failure, unspecified: Secondary | ICD-10-CM | POA: Diagnosis not present

## 2022-05-30 DIAGNOSIS — I5022 Chronic systolic (congestive) heart failure: Secondary | ICD-10-CM

## 2022-05-30 DIAGNOSIS — Z944 Liver transplant status: Secondary | ICD-10-CM | POA: Diagnosis not present

## 2022-05-30 DIAGNOSIS — J111 Influenza due to unidentified influenza virus with other respiratory manifestations: Secondary | ICD-10-CM | POA: Diagnosis not present

## 2022-05-30 DIAGNOSIS — E7800 Pure hypercholesterolemia, unspecified: Secondary | ICD-10-CM | POA: Diagnosis not present

## 2022-05-30 DIAGNOSIS — Z6822 Body mass index (BMI) 22.0-22.9, adult: Secondary | ICD-10-CM | POA: Diagnosis not present

## 2022-06-08 DIAGNOSIS — C44529 Squamous cell carcinoma of skin of other part of trunk: Secondary | ICD-10-CM | POA: Diagnosis not present

## 2022-06-08 DIAGNOSIS — C44509 Unspecified malignant neoplasm of skin of other part of trunk: Secondary | ICD-10-CM | POA: Diagnosis not present

## 2022-06-09 ENCOUNTER — Encounter (HOSPITAL_COMMUNITY): Payer: Self-pay

## 2022-06-09 ENCOUNTER — Ambulatory Visit (HOSPITAL_COMMUNITY)
Admission: RE | Admit: 2022-06-09 | Discharge: 2022-06-09 | Disposition: A | Discharge status: Home / Self Care | Payer: Medicare HMO | Source: Ambulatory Visit | Attending: Family Medicine | Admitting: Family Medicine

## 2022-06-09 VITALS — BP 120/80 | HR 73 | Wt 152.0 lb

## 2022-06-09 DIAGNOSIS — F1411 Cocaine abuse, in remission: Secondary | ICD-10-CM

## 2022-06-09 DIAGNOSIS — Z79621 Long term (current) use of calcineurin inhibitor: Secondary | ICD-10-CM | POA: Insufficient documentation

## 2022-06-09 DIAGNOSIS — I252 Old myocardial infarction: Secondary | ICD-10-CM | POA: Diagnosis not present

## 2022-06-09 DIAGNOSIS — I130 Hypertensive heart and chronic kidney disease with heart failure and stage 1 through stage 4 chronic kidney disease, or unspecified chronic kidney disease: Secondary | ICD-10-CM | POA: Insufficient documentation

## 2022-06-09 DIAGNOSIS — F1410 Cocaine abuse, uncomplicated: Secondary | ICD-10-CM | POA: Insufficient documentation

## 2022-06-09 DIAGNOSIS — E1122 Type 2 diabetes mellitus with diabetic chronic kidney disease: Secondary | ICD-10-CM | POA: Diagnosis not present

## 2022-06-09 DIAGNOSIS — B700 Diphyllobothriasis: Secondary | ICD-10-CM | POA: Diagnosis not present

## 2022-06-09 DIAGNOSIS — Z7901 Long term (current) use of anticoagulants: Secondary | ICD-10-CM | POA: Insufficient documentation

## 2022-06-09 DIAGNOSIS — E559 Vitamin D deficiency, unspecified: Secondary | ICD-10-CM | POA: Diagnosis not present

## 2022-06-09 DIAGNOSIS — I2510 Atherosclerotic heart disease of native coronary artery without angina pectoris: Secondary | ICD-10-CM | POA: Insufficient documentation

## 2022-06-09 DIAGNOSIS — R42 Dizziness and giddiness: Secondary | ICD-10-CM | POA: Diagnosis not present

## 2022-06-09 DIAGNOSIS — Z944 Liver transplant status: Secondary | ICD-10-CM | POA: Diagnosis not present

## 2022-06-09 DIAGNOSIS — I5022 Chronic systolic (congestive) heart failure: Secondary | ICD-10-CM | POA: Diagnosis not present

## 2022-06-09 DIAGNOSIS — Z79899 Other long term (current) drug therapy: Secondary | ICD-10-CM | POA: Diagnosis not present

## 2022-06-09 DIAGNOSIS — I454 Nonspecific intraventricular block: Secondary | ICD-10-CM | POA: Insufficient documentation

## 2022-06-09 DIAGNOSIS — N183 Chronic kidney disease, stage 3 unspecified: Secondary | ICD-10-CM | POA: Insufficient documentation

## 2022-06-09 DIAGNOSIS — N1832 Chronic kidney disease, stage 3b: Secondary | ICD-10-CM | POA: Diagnosis not present

## 2022-06-09 DIAGNOSIS — Z951 Presence of aortocoronary bypass graft: Secondary | ICD-10-CM | POA: Insufficient documentation

## 2022-06-09 DIAGNOSIS — Z87891 Personal history of nicotine dependence: Secondary | ICD-10-CM | POA: Diagnosis not present

## 2022-06-09 DIAGNOSIS — I480 Paroxysmal atrial fibrillation: Secondary | ICD-10-CM

## 2022-06-09 DIAGNOSIS — D638 Anemia in other chronic diseases classified elsewhere: Secondary | ICD-10-CM | POA: Diagnosis not present

## 2022-06-09 DIAGNOSIS — R809 Proteinuria, unspecified: Secondary | ICD-10-CM | POA: Diagnosis not present

## 2022-06-09 DIAGNOSIS — Z7984 Long term (current) use of oral hypoglycemic drugs: Secondary | ICD-10-CM | POA: Insufficient documentation

## 2022-06-09 DIAGNOSIS — I255 Ischemic cardiomyopathy: Secondary | ICD-10-CM | POA: Diagnosis not present

## 2022-06-09 DIAGNOSIS — R69 Illness, unspecified: Secondary | ICD-10-CM | POA: Diagnosis not present

## 2022-06-09 LAB — BASIC METABOLIC PANEL
Anion gap: 5 (ref 5–15)
BUN: 28 mg/dL — ABNORMAL HIGH (ref 8–23)
CO2: 25 mmol/L (ref 22–32)
Calcium: 8.4 mg/dL — ABNORMAL LOW (ref 8.9–10.3)
Chloride: 110 mmol/L (ref 98–111)
Creatinine, Ser: 1.9 mg/dL — ABNORMAL HIGH (ref 0.61–1.24)
GFR, Estimated: 39 mL/min — ABNORMAL LOW (ref >60)
Glucose, Bld: 84 mg/dL (ref 70–99)
Potassium: 5 mmol/L (ref 3.5–5.1)
Sodium: 140 mmol/L (ref 135–145)

## 2022-06-09 NOTE — Patient Instructions (Addendum)
Thank you for coming in today  Labs were done today, if any labs are abnormal the clinic will call you No news is good news  Your physician recommends that you schedule a follow-up appointment in:  3 months with Dr. Mclean    Do the following things EVERYDAY: Weigh yourself in the morning before breakfast. Write it down and keep it in a log. Take your medicines as prescribed Eat low salt foods--Limit salt (sodium) to 2000 mg per day.  Stay as active as you can everyday Limit all fluids for the day to less than 2 liters  At the [facility], you and your health needs are our priority. As part of our continuing mission to provide you with exceptional heart care, we have created designated Provider Care Teams. These Care Teams include your primary Cardiologist (physician) and Advanced Practice Providers (APPs- Physician Assistants and Nurse Practitioners) who all work together to provide you with the care you need, when you need it.   You may see any of the following providers on your designated Care Team at your next follow up: Dr Daniel Bensimhon Dr Dalton McLean Dr. Aditya Sabharwal Amy Clegg, NP Brittainy Simmons, PA Jessica Milford,NP Lindsay Finch, PA Alma Diaz, NP Lauren Kemp, PharmD   Please be sure to bring in all your medications bottles to every appointment.   If you have any questions or concerns before your next appointment please send us a message through mychart or call our office at [phone].    TO LEAVE A MESSAGE FOR THE NURSE SELECT OPTION 2, PLEASE LEAVE A MESSAGE INCLUDING: YOUR NAME DATE OF BIRTH CALL BACK NUMBER REASON FOR CALL**this is important as we prioritize the call backs  YOU WILL RECEIVE A CALL BACK THE SAME DAY AS LONG AS YOU CALL BEFORE 4:00 PM  

## 2022-06-09 NOTE — Progress Notes (Signed)
[facility] Note   PCP: Monico Blitz, MD HF Cardiology: Dr. [name]   HPI: 67 y.o. male w/ h/o HCV s/p liver transplant in 2009 (followed at [facility]), Stage III CKD, HTN and T2DM.   He was admitted 7/21 w/ NSTEMI and Afib w/ RVR, found to have severe 3VD and biventricular heart failure, LVEF 20-25%, RV moderately reduced. Underwent CABG + MAZE + LA appendage clipping 12/19/19. Required milrinone post operatively. Post op course complicated by ATN/ renal failure. Required CVVHD but remained anuric. Tunneled HD cath placed and transitioned to iHD, MWF.  He was able to transition off HD as an outpatient.   Repeat echo done in 10/21 with EF 20-25%, moderate LV dilation, mild-moderately decreased RV systolic function, moderate MR.   Echo in 4/22 showed EF 30%, mid anterior and inferior severe hypokinesis, basal to mid anterolateral and inferolateral akinesis, mildly decreased RV systolic function.   Admitted to [facility] 05/09/22-05/14/22 with AKI and acute ischemic liver injury in setting of cocaine use. Suspected ischemic injury 2/2 hypoperfusion from coronary vasospasm. AST/ALT peaked to 4,000s and Scr up to 8.1. He required CRRT briefly (12/05-12/06). Scr improved to 2.3 (near baseline).  Echo during admit with EF 20%, mildly reduced RV, mild AI, mild MR, mild TR. GDMT held during admit but resumed at discharge.  Post hospital follow up 12/23, off all GDMT. NYHA I-II and volume stable.  Today he returns for HF follow up. Recently treated for influenza A and treated with oseltamivir. Feels better now but a little dizzy.  He is not short of breath walking on flat ground. Denies abnormal bleeding CP, edema, or PND/Orthopnea. Appetite ok. No fever or chills. Weight at home 152 pounds. Taking all medications. Has not used cocaine since discharge 05/09/22. He works at a [employer] and is doing light exercises.   ECG (personally reviewed): NSR 68 bpm, IVCD QRS 132 msec  Labs (9/21): LDL 66, HDL  44 Labs (10/21): K 3.9, creatinine 2.02 Labs (12/21): K 3.7, creatinine 1.58 Labs (9/21): LDL 66 Labs (1/22): K 4.1, creatinine 1.6 Labs (3/22): K 4.6, creatinine 1.56 => 2.09 Labs (4/22): LDL 66 Labs (8/22): K 5.5, creatinine 1.92 Labs (2/23): K 6.2, creatinine 2.36 Labs (6/23): K 5.2, creatinine 2.12 Labs (12/23): K 5.0, creatinine 2.15  Review of systems complete and found to be negative unless listed in HPI.    PMH: 1. CKD stage III: He was on HD after CABG in 7/21 but renal function recovered.  2. HCV with cirrhosis, s/p liver transplant in 2009.  3. HTN 4. Psoriasis 5. Type 2 diabetes 6. Hyperlipidemia 7. CAD: NSTEMI 7/21.  - CABG (7/21) with LIMA-LAD, RIMA-ramus, sequential SVG-PDA and PLV, SVG -D 8. Atrial fibrillation: Paroxysmal.  - S/p Maze and LA appendage clip with CABG in 7/21.  9. Chronic systolic CHF: Ischemic cardiomyopathy.  - Echo (10/21): EF 20-25%, moderate LV dilation, mild-moderately decreased RV systolic function, moderate MR.  - Echo (4/22): EF 30%, mid anterior and inferior severe hypokinesis, basal to mid anterolateral and inferolateral akinesis, mildly decreased RV systolic function. - Echo (12/23) at [facility]: EF 20%, LV RWMA, mildly reduced RV, mild MR, mild AI, mild TR  Current Outpatient Medications  Medication Sig Dispense Refill   Apremilast (OTEZLA) 30 MG TABS Take 1 tablet by mouth 2 (two) times daily.     atorvastatin (LIPITOR) 80 MG tablet Take 1 tablet (80 mg total) by mouth daily. 90 tablet 3   carvedilol (COREG) 6.25 MG tablet Take 1.5  tablets (9.375 mg total) by mouth 2 (two) times daily with a meal. 90 tablet 11   dapagliflozin propanediol (FARXIGA) 10 MG TABS tablet Take 1 tablet (10 mg total) by mouth daily before breakfast. 90 tablet 3   ELIQUIS 5 MG TABS tablet Take 1 tablet by mouth twice daily 60 tablet 6   FLUoxetine (PROZAC) 40 MG capsule Take 20 mg by mouth daily.     LORazepam (ATIVAN) 1 MG tablet Take 1 mg by mouth 2 (two) times  daily as needed (panic attacks).      sacubitril-valsartan (ENTRESTO) 24-26 MG Take 1 tablet by mouth 2 (two) times daily. 180 tablet 3   sildenafil (VIAGRA) 50 MG tablet TAKE 1 TABLET BY MOUTH AS NEEDED FOR ERECTILE DYSFUNCTION 20 tablet 0   spironolactone (ALDACTONE) 25 MG tablet Take 12.5 mg by mouth daily.     tacrolimus (PROGRAF) 1 MG capsule Take 2 tabs by mouth every morning & 1 tab every evening     triamcinolone cream (KENALOG) 0.1 % Apply 1 application  topically 2 (two) times daily.     oseltamivir (TAMIFLU) 30 MG capsule Take 1 capsule (30 mg total) by mouth every 12 (twelve) hours. (Patient not taking: Reported on 06/09/2022) 10 capsule 0   No current facility-administered medications for this encounter.   Allergies  Allergen Reactions   Morphine And Related Nausea And Vomiting   Social History   Socioeconomic History   Marital status: Widowed    Spouse name: Not on file   Number of children: Not on file   Years of education: Not on file   Highest education level: Not on file  Occupational History   Not on file  Tobacco Use   Smoking status: Former    Packs/day: 1.00    Types: Cigarettes    Quit date: 09/04/2007    Years since quitting: 14.7   Smokeless tobacco: Never  Vaping Use   Vaping Use: Never used  Substance and Sexual Activity   Alcohol use: No   Drug use: No   Sexual activity: Yes    Birth control/protection: None  Other Topics Concern   Not on file  Social History Narrative   Not on file   Social Determinants of Health   Financial Resource Strain: Not on file  Food Insecurity: No Food Insecurity (01/05/2020)   Hunger Vital Sign    Worried About Running Out of Food in the Last Year: Never true    [place] in the Last Year: Never true  Transportation Needs: No Transportation Needs (01/05/2020)   PRAPARE - Hydrologist (Medical): No    Lack of Transportation (Non-Medical): No  Physical Activity: Not on file  Stress:  Not on file  Social Connections: Not on file  Intimate Partner Violence: Not on file   Family History  Problem Relation Age of Onset   Diabetes Mother    Heart disease Father    Heart disease Son    BP 120/80   Pulse 73   Wt 68.9 kg (152 lb)   SpO2 100%   BMI 23.81 kg/m   Wt Readings from Last 3 Encounters:  06/09/22 68.9 kg (152 lb)  05/24/22 68 kg (150 lb)  05/22/22 68 kg (150 lb)   PHYSICAL EXAM: General:  NAD. No resp difficulty, walked into clinic, thin HEENT: Normal Neck: Supple. No JVD. Carotids 2+ bilat; no bruits. No lymphadenopathy or thryomegaly appreciated. Cor: PMI nondisplaced. Regular rate &  rhythm. No rubs, gallops or murmurs. Lungs: Clear Abdomen: Soft, nontender, nondistended. No hepatosplenomegaly. No bruits or masses. Good bowel sounds. Extremities: No cyanosis, clubbing, rash, edema Neuro: Alert & oriented x 3, cranial nerves grossly intact. Moves all 4 extremities w/o difficulty. Affect pleasant.  ASSESSMENT & PLAN: 1. CAD: NSTEMI in 7/21.  Suspect out of hospital inferolateral MI.  Cardiac MRI showed that the inferolateral wall was likely not viable, but the remainder of the LV myocardium was likely viable. He had CABG 12/19/19 with LIMA-LAD, RIMA-ramus, seq SVG-PD/PL, SVG-D.  No chest pain.  - No ASA given Eliquis use.  - Continue atorvastatin 80 mg daily. 2. Chronic Systolic CHF: Ischemic cardiomyopathy.  Echo 7/21 with EF 20-25%, moderately decreased RV systolic function. Cardiac MRI with LV EF 26%, RV EF 38%, near full thickness scar in inferolateral wall suggesting lack of viability in the LCx territory.  Now post-CABG.  Echo in 10/21 showed EF 20-25%, moderate LV dilation, mild-moderately decreased RV systolic function, moderate MR.  Echo in 4/22 showed EF 30%, mid anterior and inferior severe hypokinesis, basal to mid anterolateral and inferolateral akinesis, mildly decreased RV systolic function.  Most recent echo at [facility] 12/23 with EF 20%, regional  WMA in LV, mildly reduced RV. NYHA class I-II symptoms.  He is not volume overloaded.    - Continue Entresto 24/26 mg bid. Will not increase today with recent influenza infection and occasional dizziness. - Continue Coreg 9.375 mg bid. - Continue Farxiga 10 mg daily. - Continue spironolactone 12.5 mg daily. - He does not appear to need Lasix.    - He has decided that he does not want an ICD.   - Recommended he abstain from cocaine use. 3. Atrial fibrillation:  S/p Maze + LAA clipping on 7/16.  He is off amiodarone.  He is in NSR today.  - Continue Eliquis 5 mg bid. No bleeding issues. 4. CKD: Stage 3.  He required HD after CABG but renal function recovered. Scr baseline has been in low 2s - Recent admit with AKI suspected d/t ischemic injury in setting of cocaine use. Briefly required CRRT. - Check BMET today. 5. Liver transplant for HCV: Followed at [facility]. On Tacrolimus.  - Recent liver injury in setting of cocaine use. Resolved. 6. Cocaine abuse: Complete cessation recommended. - He has not used since discharge. May be an candidate for ICD if he can demonstrate continued abstinence.  Follow up in 3 months with Dr. [name].  [place], FNP 06/09/22

## 2022-06-14 DIAGNOSIS — R4182 Altered mental status, unspecified: Secondary | ICD-10-CM | POA: Diagnosis not present

## 2022-06-14 DIAGNOSIS — Z743 Need for continuous supervision: Secondary | ICD-10-CM | POA: Diagnosis not present

## 2022-06-14 DIAGNOSIS — E440 Moderate protein-calorie malnutrition: Secondary | ICD-10-CM | POA: Diagnosis not present

## 2022-06-14 DIAGNOSIS — T400X1A Poisoning by opium, accidental (unintentional), initial encounter: Secondary | ICD-10-CM | POA: Diagnosis not present

## 2022-06-14 DIAGNOSIS — I252 Old myocardial infarction: Secondary | ICD-10-CM | POA: Diagnosis not present

## 2022-06-14 DIAGNOSIS — G319 Degenerative disease of nervous system, unspecified: Secondary | ICD-10-CM | POA: Diagnosis not present

## 2022-06-14 DIAGNOSIS — Z7901 Long term (current) use of anticoagulants: Secondary | ICD-10-CM | POA: Diagnosis not present

## 2022-06-14 DIAGNOSIS — J80 Acute respiratory distress syndrome: Secondary | ICD-10-CM | POA: Diagnosis not present

## 2022-06-14 DIAGNOSIS — Z85828 Personal history of other malignant neoplasm of skin: Secondary | ICD-10-CM | POA: Diagnosis not present

## 2022-06-14 DIAGNOSIS — E211 Secondary hyperparathyroidism, not elsewhere classified: Secondary | ICD-10-CM | POA: Diagnosis not present

## 2022-06-14 DIAGNOSIS — J189 Pneumonia, unspecified organism: Secondary | ICD-10-CM | POA: Diagnosis not present

## 2022-06-14 DIAGNOSIS — I5022 Chronic systolic (congestive) heart failure: Secondary | ICD-10-CM | POA: Diagnosis not present

## 2022-06-14 DIAGNOSIS — I129 Hypertensive chronic kidney disease with stage 1 through stage 4 chronic kidney disease, or unspecified chronic kidney disease: Secondary | ICD-10-CM | POA: Diagnosis not present

## 2022-06-14 DIAGNOSIS — E559 Vitamin D deficiency, unspecified: Secondary | ICD-10-CM | POA: Diagnosis not present

## 2022-06-14 DIAGNOSIS — R918 Other nonspecific abnormal finding of lung field: Secondary | ICD-10-CM | POA: Diagnosis not present

## 2022-06-14 DIAGNOSIS — R809 Proteinuria, unspecified: Secondary | ICD-10-CM | POA: Diagnosis not present

## 2022-06-14 DIAGNOSIS — R262 Difficulty in walking, not elsewhere classified: Secondary | ICD-10-CM | POA: Diagnosis not present

## 2022-06-14 DIAGNOSIS — Z944 Liver transplant status: Secondary | ICD-10-CM | POA: Diagnosis not present

## 2022-06-14 DIAGNOSIS — I447 Left bundle-branch block, unspecified: Secondary | ICD-10-CM | POA: Diagnosis not present

## 2022-06-14 DIAGNOSIS — E875 Hyperkalemia: Secondary | ICD-10-CM | POA: Diagnosis not present

## 2022-06-14 DIAGNOSIS — Z79899 Other long term (current) drug therapy: Secondary | ICD-10-CM | POA: Diagnosis not present

## 2022-06-14 DIAGNOSIS — I639 Cerebral infarction, unspecified: Secondary | ICD-10-CM | POA: Diagnosis not present

## 2022-06-14 DIAGNOSIS — J690 Pneumonitis due to inhalation of food and vomit: Secondary | ICD-10-CM | POA: Diagnosis not present

## 2022-06-14 DIAGNOSIS — I700 Atherosclerosis of aorta: Secondary | ICD-10-CM | POA: Diagnosis not present

## 2022-06-14 DIAGNOSIS — I5023 Acute on chronic systolic (congestive) heart failure: Secondary | ICD-10-CM | POA: Diagnosis not present

## 2022-06-14 DIAGNOSIS — D631 Anemia in chronic kidney disease: Secondary | ICD-10-CM | POA: Diagnosis not present

## 2022-06-14 DIAGNOSIS — N1832 Chronic kidney disease, stage 3b: Secondary | ICD-10-CM | POA: Diagnosis not present

## 2022-06-14 DIAGNOSIS — F1490 Cocaine use, unspecified, uncomplicated: Secondary | ICD-10-CM | POA: Diagnosis not present

## 2022-06-14 DIAGNOSIS — R092 Respiratory arrest: Secondary | ICD-10-CM | POA: Diagnosis not present

## 2022-06-14 DIAGNOSIS — R55 Syncope and collapse: Secondary | ICD-10-CM | POA: Diagnosis not present

## 2022-06-14 DIAGNOSIS — E1122 Type 2 diabetes mellitus with diabetic chronic kidney disease: Secondary | ICD-10-CM | POA: Diagnosis not present

## 2022-06-14 DIAGNOSIS — F1410 Cocaine abuse, uncomplicated: Secondary | ICD-10-CM | POA: Diagnosis not present

## 2022-06-14 DIAGNOSIS — J811 Chronic pulmonary edema: Secondary | ICD-10-CM | POA: Diagnosis not present

## 2022-06-14 DIAGNOSIS — Z6821 Body mass index (BMI) 21.0-21.9, adult: Secondary | ICD-10-CM | POA: Diagnosis not present

## 2022-06-14 DIAGNOSIS — Z7984 Long term (current) use of oral hypoglycemic drugs: Secondary | ICD-10-CM | POA: Diagnosis not present

## 2022-06-14 DIAGNOSIS — Z8619 Personal history of other infectious and parasitic diseases: Secondary | ICD-10-CM | POA: Diagnosis not present

## 2022-06-14 DIAGNOSIS — D638 Anemia in other chronic diseases classified elsewhere: Secondary | ICD-10-CM | POA: Diagnosis not present

## 2022-06-14 DIAGNOSIS — N170 Acute kidney failure with tubular necrosis: Secondary | ICD-10-CM | POA: Diagnosis not present

## 2022-06-14 DIAGNOSIS — I130 Hypertensive heart and chronic kidney disease with heart failure and stage 1 through stage 4 chronic kidney disease, or unspecified chronic kidney disease: Secondary | ICD-10-CM | POA: Diagnosis not present

## 2022-06-14 DIAGNOSIS — R69 Illness, unspecified: Secondary | ICD-10-CM | POA: Diagnosis not present

## 2022-06-14 DIAGNOSIS — J90 Pleural effusion, not elsewhere classified: Secondary | ICD-10-CM | POA: Diagnosis not present

## 2022-06-15 ENCOUNTER — Other Ambulatory Visit: Payer: Self-pay

## 2022-06-15 DIAGNOSIS — T50901A Poisoning by unspecified drugs, medicaments and biological substances, accidental (unintentional), initial encounter: Secondary | ICD-10-CM | POA: Insufficient documentation

## 2022-06-15 DIAGNOSIS — E440 Moderate protein-calorie malnutrition: Secondary | ICD-10-CM | POA: Diagnosis not present

## 2022-06-15 DIAGNOSIS — D849 Immunodeficiency, unspecified: Secondary | ICD-10-CM | POA: Diagnosis not present

## 2022-06-15 DIAGNOSIS — E46 Unspecified protein-calorie malnutrition: Secondary | ICD-10-CM | POA: Diagnosis not present

## 2022-06-15 DIAGNOSIS — Z8639 Personal history of other endocrine, nutritional and metabolic disease: Secondary | ICD-10-CM | POA: Diagnosis not present

## 2022-06-15 DIAGNOSIS — R4182 Altered mental status, unspecified: Secondary | ICD-10-CM | POA: Diagnosis not present

## 2022-06-15 DIAGNOSIS — Z8719 Personal history of other diseases of the digestive system: Secondary | ICD-10-CM | POA: Diagnosis not present

## 2022-06-15 DIAGNOSIS — J189 Pneumonia, unspecified organism: Secondary | ICD-10-CM | POA: Diagnosis not present

## 2022-06-15 DIAGNOSIS — J811 Chronic pulmonary edema: Secondary | ICD-10-CM | POA: Diagnosis not present

## 2022-06-15 DIAGNOSIS — R918 Other nonspecific abnormal finding of lung field: Secondary | ICD-10-CM | POA: Diagnosis not present

## 2022-06-15 DIAGNOSIS — G319 Degenerative disease of nervous system, unspecified: Secondary | ICD-10-CM | POA: Diagnosis not present

## 2022-06-15 DIAGNOSIS — Z7901 Long term (current) use of anticoagulants: Secondary | ICD-10-CM | POA: Diagnosis not present

## 2022-06-15 DIAGNOSIS — E1122 Type 2 diabetes mellitus with diabetic chronic kidney disease: Secondary | ICD-10-CM | POA: Diagnosis not present

## 2022-06-15 DIAGNOSIS — Z944 Liver transplant status: Secondary | ICD-10-CM | POA: Diagnosis not present

## 2022-06-15 DIAGNOSIS — I509 Heart failure, unspecified: Secondary | ICD-10-CM | POA: Diagnosis not present

## 2022-06-15 DIAGNOSIS — Z8679 Personal history of other diseases of the circulatory system: Secondary | ICD-10-CM | POA: Diagnosis not present

## 2022-06-15 DIAGNOSIS — Z792 Long term (current) use of antibiotics: Secondary | ICD-10-CM | POA: Diagnosis not present

## 2022-06-15 DIAGNOSIS — Z9981 Dependence on supplemental oxygen: Secondary | ICD-10-CM | POA: Diagnosis not present

## 2022-06-15 DIAGNOSIS — D631 Anemia in chronic kidney disease: Secondary | ICD-10-CM | POA: Insufficient documentation

## 2022-06-15 DIAGNOSIS — I5023 Acute on chronic systolic (congestive) heart failure: Secondary | ICD-10-CM | POA: Diagnosis not present

## 2022-06-15 DIAGNOSIS — J690 Pneumonitis due to inhalation of food and vomit: Secondary | ICD-10-CM | POA: Diagnosis not present

## 2022-06-15 DIAGNOSIS — J90 Pleural effusion, not elsewhere classified: Secondary | ICD-10-CM | POA: Diagnosis not present

## 2022-06-15 DIAGNOSIS — I639 Cerebral infarction, unspecified: Secondary | ICD-10-CM | POA: Diagnosis not present

## 2022-06-15 DIAGNOSIS — I700 Atherosclerosis of aorta: Secondary | ICD-10-CM | POA: Diagnosis not present

## 2022-06-15 DIAGNOSIS — R262 Difficulty in walking, not elsewhere classified: Secondary | ICD-10-CM | POA: Diagnosis not present

## 2022-06-15 DIAGNOSIS — Z85828 Personal history of other malignant neoplasm of skin: Secondary | ICD-10-CM | POA: Diagnosis not present

## 2022-06-15 DIAGNOSIS — Z7984 Long term (current) use of oral hypoglycemic drugs: Secondary | ICD-10-CM | POA: Diagnosis not present

## 2022-06-15 DIAGNOSIS — R69 Illness, unspecified: Secondary | ICD-10-CM | POA: Diagnosis not present

## 2022-06-15 DIAGNOSIS — I252 Old myocardial infarction: Secondary | ICD-10-CM | POA: Diagnosis not present

## 2022-06-15 DIAGNOSIS — N1832 Chronic kidney disease, stage 3b: Secondary | ICD-10-CM | POA: Diagnosis not present

## 2022-06-15 DIAGNOSIS — R55 Syncope and collapse: Secondary | ICD-10-CM | POA: Diagnosis not present

## 2022-06-15 DIAGNOSIS — F1490 Cocaine use, unspecified, uncomplicated: Secondary | ICD-10-CM | POA: Diagnosis not present

## 2022-06-15 DIAGNOSIS — Z87448 Personal history of other diseases of urinary system: Secondary | ICD-10-CM | POA: Diagnosis not present

## 2022-06-15 DIAGNOSIS — T402X1A Poisoning by other opioids, accidental (unintentional), initial encounter: Secondary | ICD-10-CM | POA: Diagnosis not present

## 2022-06-15 DIAGNOSIS — I130 Hypertensive heart and chronic kidney disease with heart failure and stage 1 through stage 4 chronic kidney disease, or unspecified chronic kidney disease: Secondary | ICD-10-CM | POA: Diagnosis not present

## 2022-06-15 DIAGNOSIS — Z6821 Body mass index (BMI) 21.0-21.9, adult: Secondary | ICD-10-CM | POA: Diagnosis not present

## 2022-06-15 DIAGNOSIS — Z79899 Other long term (current) drug therapy: Secondary | ICD-10-CM | POA: Diagnosis not present

## 2022-06-15 DIAGNOSIS — T50991A Poisoning by other drugs, medicaments and biological substances, accidental (unintentional), initial encounter: Secondary | ICD-10-CM | POA: Diagnosis not present

## 2022-06-15 DIAGNOSIS — Z8619 Personal history of other infectious and parasitic diseases: Secondary | ICD-10-CM | POA: Diagnosis not present

## 2022-06-16 DIAGNOSIS — E440 Moderate protein-calorie malnutrition: Secondary | ICD-10-CM | POA: Diagnosis not present

## 2022-06-16 DIAGNOSIS — I5023 Acute on chronic systolic (congestive) heart failure: Secondary | ICD-10-CM | POA: Diagnosis not present

## 2022-06-16 DIAGNOSIS — D849 Immunodeficiency, unspecified: Secondary | ICD-10-CM | POA: Diagnosis not present

## 2022-06-16 DIAGNOSIS — I252 Old myocardial infarction: Secondary | ICD-10-CM | POA: Diagnosis not present

## 2022-06-16 DIAGNOSIS — I8500 Esophageal varices without bleeding: Secondary | ICD-10-CM | POA: Diagnosis not present

## 2022-06-16 DIAGNOSIS — T6591XD Toxic effect of unspecified substance, accidental (unintentional), subsequent encounter: Secondary | ICD-10-CM | POA: Diagnosis not present

## 2022-06-16 DIAGNOSIS — J189 Pneumonia, unspecified organism: Secondary | ICD-10-CM | POA: Diagnosis not present

## 2022-06-16 DIAGNOSIS — N1832 Chronic kidney disease, stage 3b: Secondary | ICD-10-CM | POA: Diagnosis not present

## 2022-06-16 DIAGNOSIS — C44529 Squamous cell carcinoma of skin of other part of trunk: Secondary | ICD-10-CM | POA: Diagnosis not present

## 2022-06-16 DIAGNOSIS — E1122 Type 2 diabetes mellitus with diabetic chronic kidney disease: Secondary | ICD-10-CM | POA: Diagnosis not present

## 2022-06-16 DIAGNOSIS — R69 Illness, unspecified: Secondary | ICD-10-CM | POA: Diagnosis not present

## 2022-06-19 ENCOUNTER — Inpatient Hospital Stay (HOSPITAL_COMMUNITY): Admission: RE | Admit: 2022-06-19 | Payer: Medicare HMO | Source: Ambulatory Visit

## 2022-06-20 ENCOUNTER — Encounter (HOSPITAL_COMMUNITY)
Admission: RE | Admit: 2022-06-20 | Discharge: 2022-06-20 | Disposition: A | Discharge status: Home / Self Care | Payer: Medicare HMO | Source: Ambulatory Visit | Attending: Nephrology | Admitting: Nephrology

## 2022-06-20 VITALS — BP 127/69 | HR 69 | Temp 97.4°F | Resp 18

## 2022-06-20 DIAGNOSIS — D631 Anemia in chronic kidney disease: Secondary | ICD-10-CM | POA: Diagnosis not present

## 2022-06-20 DIAGNOSIS — N1832 Chronic kidney disease, stage 3b: Secondary | ICD-10-CM

## 2022-06-20 LAB — POCT HEMOGLOBIN-HEMACUE: Hemoglobin: 7.4 g/dL — ABNORMAL LOW (ref 13.0–17.0)

## 2022-06-20 MED ORDER — EPOETIN ALFA 3000 UNIT/ML IJ SOLN
3000.0000 [IU] | Freq: Once | INTRAMUSCULAR | Status: AC
  Administered 2022-06-20: 3000 [IU] via SUBCUTANEOUS
  Filled 2022-06-20: qty 1

## 2022-06-20 NOTE — Progress Notes (Signed)
Diagnosis: Anemia in Chronic Kidney Disease  Provider:  Manpreet Bhutani MD  Procedure: Injection  Procrit (epoetin alfa), Dose: 3000 Units, Site: subcutaneous, Number of injections: 1  Hgb 7.6  Post Care: Patient declined observation  Discharge: Condition: Good, Destination: Home . AVS provided to patient.   Performed by:  Baxter Hire, RN

## 2022-06-22 DIAGNOSIS — I509 Heart failure, unspecified: Secondary | ICD-10-CM | POA: Diagnosis not present

## 2022-06-22 DIAGNOSIS — I10 Essential (primary) hypertension: Secondary | ICD-10-CM | POA: Diagnosis not present

## 2022-06-22 DIAGNOSIS — D239 Other benign neoplasm of skin, unspecified: Secondary | ICD-10-CM | POA: Diagnosis not present

## 2022-06-22 DIAGNOSIS — R69 Illness, unspecified: Secondary | ICD-10-CM | POA: Diagnosis not present

## 2022-06-22 DIAGNOSIS — Z85828 Personal history of other malignant neoplasm of skin: Secondary | ICD-10-CM | POA: Diagnosis not present

## 2022-06-22 DIAGNOSIS — D649 Anemia, unspecified: Secondary | ICD-10-CM | POA: Diagnosis not present

## 2022-06-22 DIAGNOSIS — N1832 Chronic kidney disease, stage 3b: Secondary | ICD-10-CM | POA: Diagnosis not present

## 2022-06-22 DIAGNOSIS — Z1283 Encounter for screening for malignant neoplasm of skin: Secondary | ICD-10-CM | POA: Diagnosis not present

## 2022-06-22 DIAGNOSIS — Z299 Encounter for prophylactic measures, unspecified: Secondary | ICD-10-CM | POA: Diagnosis not present

## 2022-06-24 DIAGNOSIS — R69 Illness, unspecified: Secondary | ICD-10-CM | POA: Diagnosis not present

## 2022-06-26 DIAGNOSIS — G4700 Insomnia, unspecified: Secondary | ICD-10-CM | POA: Diagnosis not present

## 2022-06-26 DIAGNOSIS — I10 Essential (primary) hypertension: Secondary | ICD-10-CM | POA: Diagnosis not present

## 2022-06-26 DIAGNOSIS — I509 Heart failure, unspecified: Secondary | ICD-10-CM | POA: Diagnosis not present

## 2022-06-26 DIAGNOSIS — Z299 Encounter for prophylactic measures, unspecified: Secondary | ICD-10-CM | POA: Diagnosis not present

## 2022-06-26 DIAGNOSIS — G4710 Hypersomnia, unspecified: Secondary | ICD-10-CM | POA: Diagnosis not present

## 2022-06-26 DIAGNOSIS — R0602 Shortness of breath: Secondary | ICD-10-CM | POA: Diagnosis not present

## 2022-06-29 ENCOUNTER — Ambulatory Visit (HOSPITAL_COMMUNITY)
Admission: RE | Admit: 2022-06-29 | Discharge: 2022-06-29 | Disposition: A | Discharge status: Home / Self Care | Payer: Medicare HMO | Source: Ambulatory Visit | Attending: Cardiology | Admitting: Cardiology

## 2022-06-29 ENCOUNTER — Encounter (HOSPITAL_COMMUNITY): Payer: Self-pay

## 2022-06-29 VITALS — BP 110/70 | HR 69 | Wt 150.4 lb

## 2022-06-29 DIAGNOSIS — E1122 Type 2 diabetes mellitus with diabetic chronic kidney disease: Secondary | ICD-10-CM | POA: Diagnosis not present

## 2022-06-29 DIAGNOSIS — I5022 Chronic systolic (congestive) heart failure: Secondary | ICD-10-CM | POA: Insufficient documentation

## 2022-06-29 DIAGNOSIS — Z79899 Other long term (current) drug therapy: Secondary | ICD-10-CM | POA: Diagnosis not present

## 2022-06-29 DIAGNOSIS — F1410 Cocaine abuse, uncomplicated: Secondary | ICD-10-CM | POA: Diagnosis not present

## 2022-06-29 DIAGNOSIS — I2510 Atherosclerotic heart disease of native coronary artery without angina pectoris: Secondary | ICD-10-CM | POA: Insufficient documentation

## 2022-06-29 DIAGNOSIS — I130 Hypertensive heart and chronic kidney disease with heart failure and stage 1 through stage 4 chronic kidney disease, or unspecified chronic kidney disease: Secondary | ICD-10-CM | POA: Insufficient documentation

## 2022-06-29 DIAGNOSIS — R5383 Other fatigue: Secondary | ICD-10-CM | POA: Insufficient documentation

## 2022-06-29 DIAGNOSIS — N183 Chronic kidney disease, stage 3 unspecified: Secondary | ICD-10-CM | POA: Diagnosis not present

## 2022-06-29 DIAGNOSIS — I255 Ischemic cardiomyopathy: Secondary | ICD-10-CM | POA: Diagnosis not present

## 2022-06-29 DIAGNOSIS — I480 Paroxysmal atrial fibrillation: Secondary | ICD-10-CM | POA: Diagnosis not present

## 2022-06-29 DIAGNOSIS — Z944 Liver transplant status: Secondary | ICD-10-CM | POA: Diagnosis not present

## 2022-06-29 DIAGNOSIS — I252 Old myocardial infarction: Secondary | ICD-10-CM | POA: Insufficient documentation

## 2022-06-29 DIAGNOSIS — R0609 Other forms of dyspnea: Secondary | ICD-10-CM | POA: Diagnosis not present

## 2022-06-29 DIAGNOSIS — D631 Anemia in chronic kidney disease: Secondary | ICD-10-CM | POA: Diagnosis not present

## 2022-06-29 DIAGNOSIS — Z79621 Long term (current) use of calcineurin inhibitor: Secondary | ICD-10-CM | POA: Diagnosis not present

## 2022-06-29 DIAGNOSIS — I5082 Biventricular heart failure: Secondary | ICD-10-CM | POA: Insufficient documentation

## 2022-06-29 DIAGNOSIS — Z951 Presence of aortocoronary bypass graft: Secondary | ICD-10-CM | POA: Diagnosis not present

## 2022-06-29 DIAGNOSIS — Z7984 Long term (current) use of oral hypoglycemic drugs: Secondary | ICD-10-CM | POA: Diagnosis not present

## 2022-06-29 DIAGNOSIS — Z7901 Long term (current) use of anticoagulants: Secondary | ICD-10-CM | POA: Insufficient documentation

## 2022-06-29 DIAGNOSIS — Z9151 Personal history of suicidal behavior: Secondary | ICD-10-CM | POA: Diagnosis not present

## 2022-06-29 LAB — BASIC METABOLIC PANEL
Anion gap: 8 (ref 5–15)
BUN: 26 mg/dL — ABNORMAL HIGH (ref 8–23)
CO2: 20 mmol/L — ABNORMAL LOW (ref 22–32)
Calcium: 8.4 mg/dL — ABNORMAL LOW (ref 8.9–10.3)
Chloride: 111 mmol/L (ref 98–111)
Creatinine, Ser: 2.07 mg/dL — ABNORMAL HIGH (ref 0.61–1.24)
GFR, Estimated: 35 mL/min — ABNORMAL LOW (ref >60)
Glucose, Bld: 180 mg/dL — ABNORMAL HIGH (ref 70–99)
Potassium: 4.3 mmol/L (ref 3.5–5.1)
Sodium: 139 mmol/L (ref 135–145)

## 2022-06-29 LAB — CBC
HCT: 25.8 % — ABNORMAL LOW (ref 39.0–52.0)
Hemoglobin: 8 g/dL — ABNORMAL LOW (ref 13.0–17.0)
MCH: 25.5 pg — ABNORMAL LOW (ref 26.0–34.0)
MCHC: 31 g/dL (ref 30.0–36.0)
MCV: 82.2 fL (ref 80.0–100.0)
Platelets: 198 10*3/uL (ref 150–400)
RBC: 3.14 MIL/uL — ABNORMAL LOW (ref 4.22–5.81)
RDW: 19.6 % — ABNORMAL HIGH (ref 11.5–15.5)
WBC: 4.3 10*3/uL (ref 4.0–10.5)
nRBC: 0 % (ref 0.0–0.2)

## 2022-06-29 LAB — BRAIN NATRIURETIC PEPTIDE: B Natriuretic Peptide: >4500 pg/mL — ABNORMAL HIGH (ref 0.0–100.0)

## 2022-06-29 MED ORDER — FUROSEMIDE 20 MG PO TABS: 20.0000 mg | ORAL_TABLET | Freq: Every day | ORAL | 3 refills | Status: DC

## 2022-06-29 NOTE — Patient Instructions (Addendum)
Thank you for coming in today  Labs were done today, if any labs are abnormal the clinic will call you No news is good news  START Lasix 20 mg 1 tablet daily   Your physician recommends that you schedule a follow-up appointment in:  2 weeks in clinic    Do the following things EVERYDAY: Weigh yourself in the morning before breakfast. Write it down and keep it in a log. Take your medicines as prescribed Eat low salt foods--Limit salt (sodium) to 2000 mg per day.  Stay as active as you can everyday Limit all fluids for the day to less than 2 liters  At the [facility], you and your health needs are our priority. As part of our continuing mission to provide you with exceptional heart care, we have created designated Provider Care Teams. These Care Teams include your primary Cardiologist (physician) and Advanced Practice Providers (APPs- Physician Assistants and Nurse Practitioners) who all work together to provide you with the care you need, when you need it.   You may see any of the following providers on your designated Care Team at your next follow up: Dr Glori Bickers Dr Loralie Champagne Dr. Roxana Hires, NP Lyda Jester, [place] [facility], [place] Forestine Na, NP Audry Riles, PharmD   Please be sure to bring in all your medications bottles to every appointment.   If you have any questions or concerns before your next appointment please send [place] a message through [place] or call our office at [phone].    TO LEAVE A MESSAGE FOR THE NURSE SELECT OPTION 2, PLEASE LEAVE A MESSAGE INCLUDING: YOUR NAME DATE OF BIRTH CALL BACK NUMBER REASON FOR CALL**this is important as we prioritize the call backs  YOU WILL RECEIVE A CALL BACK THE SAME DAY AS LONG AS YOU CALL BEFORE 4:00 PM

## 2022-06-29 NOTE — Progress Notes (Signed)
Advanced Heart Failure Clinic Note   PCP: Monico Blitz, MD HF Cardiology: Dr. [name]   HPI: 67 y.o. male w/ h/o HCV s/p liver transplant in 2009 (followed at [facility]), Stage III CKD, HTN and T2DM.   He was admitted 7/21 w/ NSTEMI and Afib w/ RVR, found to have severe 3VD and biventricular heart failure, LVEF 20-25%, RV moderately reduced. Underwent CABG + MAZE + LA appendage clipping 12/19/19. Required milrinone post operatively. Post op course complicated by ATN/ renal failure. Required CVVHD but remained anuric. Tunneled HD cath placed and transitioned to iHD, MWF.  He was able to transition off HD as an outpatient.   Repeat echo done in 10/21 with EF 20-25%, moderate LV dilation, mild-moderately decreased RV systolic function, moderate MR.   Echo in 4/22 showed EF 30%, mid anterior and inferior severe hypokinesis, basal to mid anterolateral and inferolateral akinesis, mildly decreased RV systolic function.   Admitted to [facility] 05/09/22-05/14/22 with AKI and acute ischemic liver injury in setting of cocaine use. Suspected ischemic injury 2/2 hypoperfusion from coronary vasospasm. AST/ALT peaked to 4,000s and Scr up to 8.1. He required CRRT briefly (12/05-12/06). Scr improved to 2.3 (near baseline).  Echo during admit with EF 20%, mildly reduced RV, mild AI, mild MR, mild TR. GDMT held during admit but resumed at discharge.  Post hospital follow up 12/23, off all GDMT. NYHA I-II and volume stable.  Had recent admit at [facility] earlier this month for unintentional opiate overdose. Patient admitted to cocaine use that was likely laced w/ with narcotics. Was lethargic on arrival and placed on Narcan drip. Also treated during admission for multifocal PNA and a/c CHF. Required IV Lasix for diuresis. Continued on all HF meds at d/c. There was ? Regarding whether Eliquis was discontinued but pt reports he his still taking this. He does have anemia of chronic disease and is getting scheduled Procrit  injections. Last Hgb ~7.4.  He presents today for post hospital f/u. Reports exertional weakness, fatigue and dyspnea. Also orthopnea. No LEE. Denies CP. Reports full med compliance w/ decent UOP. Not on loop diuretic at home. Remains on Eliquis. Denies gross bleeding. Denies any further cocaine use.    ECG today shows NSR 64 bpm   Labs (9/21): LDL 66, HDL 44 Labs (10/21): K 3.9, creatinine 2.02 Labs (12/21): K 3.7, creatinine 1.58 Labs (9/21): LDL 66 Labs (1/22): K 4.1, creatinine 1.6 Labs (3/22): K 4.6, creatinine 1.56 => 2.09 Labs (4/22): LDL 66 Labs (8/22): K 5.5, creatinine 1.92 Labs (2/23): K 6.2, creatinine 2.36 Labs (6/23): K 5.2, creatinine 2.12 Labs (12/23): K 5.0, creatinine 2.15  Review of systems complete and found to be negative unless listed in HPI.    PMH: 1. CKD stage III: He was on HD after CABG in 7/21 but renal function recovered.  2. HCV with cirrhosis, s/p liver transplant in 2009.  3. HTN 4. Psoriasis 5. Type 2 diabetes 6. Hyperlipidemia 7. CAD: NSTEMI 7/21.  - CABG (7/21) with LIMA-LAD, RIMA-ramus, sequential SVG-PDA and PLV, SVG -D 8. Atrial fibrillation: Paroxysmal.  - S/p Maze and LA appendage clip with CABG in 7/21.  9. Chronic systolic CHF: Ischemic cardiomyopathy.  - Echo (10/21): EF 20-25%, moderate LV dilation, mild-moderately decreased RV systolic function, moderate MR.  - Echo (4/22): EF 30%, mid anterior and inferior severe hypokinesis, basal to mid anterolateral and inferolateral akinesis, mildly decreased RV systolic function. - Echo (12/23) at [facility]: EF 20%, LV RWMA, mildly reduced RV, mild MR, mild AI,  mild TR  Current Outpatient Medications  Medication Sig Dispense Refill   Apremilast (OTEZLA) 30 MG TABS Take 1 tablet by mouth 2 (two) times daily.     atorvastatin (LIPITOR) 80 MG tablet Take 1 tablet (80 mg total) by mouth daily. 90 tablet 3   carvedilol (COREG) 6.25 MG tablet Take 1.5 tablets (9.375 mg total) by mouth 2 (two) times daily  with a meal. 90 tablet 11   dapagliflozin propanediol (FARXIGA) 10 MG TABS tablet Take 1 tablet (10 mg total) by mouth daily before breakfast. 90 tablet 3   ELIQUIS 5 MG TABS tablet Take 1 tablet by mouth twice daily 60 tablet 6   ergocalciferol (VITAMIN D2) 1.25 MG (50000 UT) capsule Take 50,000 Units by mouth once a week.     FLUoxetine (PROZAC) 40 MG capsule Take 20 mg by mouth daily.     LORazepam (ATIVAN) 1 MG tablet Take 1 mg by mouth 2 (two) times daily as needed (panic attacks).      patiromer (VELTASSA) 8.4 g packet Take 8.4 g by mouth 2 (two) times a week.     sacubitril-valsartan (ENTRESTO) 24-26 MG Take 1 tablet by mouth 2 (two) times daily. 180 tablet 3   sildenafil (VIAGRA) 50 MG tablet TAKE 1 TABLET BY MOUTH AS NEEDED FOR ERECTILE DYSFUNCTION 20 tablet 0   spironolactone (ALDACTONE) 25 MG tablet Take 12.5 mg by mouth daily.     tacrolimus (PROGRAF) 1 MG capsule Take 2 tabs by mouth every morning & 1 tab every evening     triamcinolone cream (KENALOG) 0.1 % Apply 1 application  topically 2 (two) times daily.     No current facility-administered medications for this encounter.   Allergies  Allergen Reactions   Morphine And Related Nausea And Vomiting   Social History   Socioeconomic History   Marital status: Widowed    Spouse name: Not on file   Number of children: Not on file   Years of education: Not on file   Highest education level: Not on file  Occupational History   Not on file  Tobacco Use   Smoking status: Former    Packs/day: 1.00    Types: Cigarettes    Quit date: 09/04/2007    Years since quitting: 14.8   Smokeless tobacco: Never  Vaping Use   Vaping Use: Never used  Substance and Sexual Activity   Alcohol use: No   Drug use: No   Sexual activity: Yes    Birth control/protection: None  Other Topics Concern   Not on file  Social History Narrative   Not on file   Social Determinants of Health   Financial Resource Strain: Not on file  Food  Insecurity: No Food Insecurity (01/05/2020)   Hunger Vital Sign    Worried About Running Out of Food in the Last Year: Never true    [place] in the Last Year: Never true  Transportation Needs: No Transportation Needs (01/05/2020)   PRAPARE - Hydrologist (Medical): No    Lack of Transportation (Non-Medical): No  Physical Activity: Not on file  Stress: Not on file  Social Connections: Not on file  Intimate Partner Violence: Not on file   Family History  Problem Relation Age of Onset   Diabetes Mother    Heart disease Father    Heart disease Son    BP 110/70   Pulse 69   Wt 68.2 kg (150 lb 6.4 oz)  SpO2 99%   BMI 23.56 kg/m   Wt Readings from Last 3 Encounters:  06/29/22 68.2 kg (150 lb 6.4 oz)  06/09/22 68.9 kg (152 lb)  05/24/22 68 kg (150 lb)   PHYSICAL EXAM: General:  thin chronically ill appearing, looks older than actual age. No respiratory difficulty HEENT: normal Neck: supple. JVD 8 cm. Carotids 2+ bilat; no bruits. No lymphadenopathy or thyromegaly appreciated. Cor: PMI nondisplaced. Regular rate & rhythm. No rubs, gallops or murmurs. Lungs: clear Abdomen: soft, nontender, nondistended. No hepatosplenomegaly. No bruits or masses. Good bowel sounds. Extremities: no cyanosis, clubbing, rash, edema Neuro: alert & oriented x 3, cranial nerves grossly intact. moves all 4 extremities w/o difficulty. Affect pleasant.   ASSESSMENT & PLAN: 1. CAD: NSTEMI in 7/21.  Suspect out of hospital inferolateral MI.  Cardiac MRI showed that the inferolateral wall was likely not viable, but the remainder of the LV myocardium was likely viable. He had CABG 12/19/19 with LIMA-LAD, RIMA-ramus, seq SVG-PD/PL, SVG-D.  Stable w/o CP  - No ASA given Eliquis use.  - Continue atorvastatin 80 mg daily. - imperative that he refrain from further cocaine use, reiterated this today  2. Chronic Systolic CHF: Ischemic cardiomyopathy.  Echo 7/21 with EF 20-25%,  moderately decreased RV systolic function. Cardiac MRI with LV EF 26%, RV EF 38%, near full thickness scar in inferolateral wall suggesting lack of viability in the LCx territory.  Now post-CABG.  Echo in 10/21 showed EF 20-25%, moderate LV dilation, mild-moderately decreased RV systolic function, moderate MR.  Echo in 4/22 showed EF 30%, mid anterior and inferior severe hypokinesis, basal to mid anterolateral and inferolateral akinesis, mildly decreased RV systolic function.  Most recent echo at [facility] 12/23 with EF 20%, regional WMA in LV, mildly reduced RV. Recent progression to NYHA Class III symptoms, also confounded by anemia of chronic disease from CKD, recent hgb 7.4-7.8 range, now getting scheduled procrit injections. He does not appear significantly grossly volume overloaded on exam but ReDs reading is elevated, ? accuracy. He is not on loop diuretic. - start Lasix 20 mg daily and check BNP and BMP today  - Continue Entresto 24/26 mg bid. Will not increase today w/ CKD and recent K level of 5.0 - Continue spironolactone 12.5 mg daily. No dose titration per above  - Continue Coreg 9.375 mg bid. - Continue Farxiga 10 mg daily. - He has decided that he does not want an ICD.   - Recommended he abstain from cocaine use. 3. Atrial fibrillation:  S/p Maze + LAA clipping on 7/16.  He is off amiodarone. NSR on EKG today.  - On Eliquis 5 mg bid. Denies gross bleeding, check CBC today. 4. CKD: Stage 3.  He required HD after CABG but renal function recovered. Scr baseline has been in low 2s. -He is followed by nephrology - continue Farxiga - check BMP today  5. Liver transplant for HCV: Followed at [facility]. On Tacrolimus.  - Recent liver injury in setting of cocaine use. Resolved. 6. Cocaine abuse: recent use w/ subsequent unintentional opiate overdose (suspect cocaine was laced w/ narcotics) required hospialization and treatment w/ narcan. - he denies further use. Complete cessation recommended. 7. Anemia  of Chronic Disease: in setting of CKD. Getting scheduled procrit injections. Complains of exertional dyspnea and fatigue. ? How much symptoms are 2/2 HF vs anemia - check CBC today    Follow up w/ APP in 2 wks to reassess volume status   Lyda Jester, PA-C 06/29/22

## 2022-07-01 DIAGNOSIS — R69 Illness, unspecified: Secondary | ICD-10-CM | POA: Diagnosis not present

## 2022-07-03 ENCOUNTER — Encounter (HOSPITAL_COMMUNITY): Payer: Medicare HMO

## 2022-07-04 ENCOUNTER — Encounter (HOSPITAL_COMMUNITY)
Admission: RE | Admit: 2022-07-04 | Discharge: 2022-07-04 | Disposition: A | Discharge status: Home / Self Care | Payer: Medicare HMO | Source: Ambulatory Visit | Attending: Nephrology | Admitting: Nephrology

## 2022-07-04 VITALS — BP 117/66 | HR 74 | Temp 97.7°F | Resp 16

## 2022-07-04 DIAGNOSIS — X58XXXA Exposure to other specified factors, initial encounter: Secondary | ICD-10-CM | POA: Diagnosis not present

## 2022-07-04 DIAGNOSIS — N1832 Chronic kidney disease, stage 3b: Secondary | ICD-10-CM | POA: Diagnosis not present

## 2022-07-04 DIAGNOSIS — D631 Anemia in chronic kidney disease: Secondary | ICD-10-CM

## 2022-07-04 DIAGNOSIS — M71812 Other specified bursopathies, left shoulder: Secondary | ICD-10-CM | POA: Diagnosis not present

## 2022-07-04 DIAGNOSIS — M25512 Pain in left shoulder: Secondary | ICD-10-CM | POA: Diagnosis not present

## 2022-07-04 DIAGNOSIS — M67814 Other specified disorders of tendon, left shoulder: Secondary | ICD-10-CM | POA: Diagnosis not present

## 2022-07-04 DIAGNOSIS — M75102 Unspecified rotator cuff tear or rupture of left shoulder, not specified as traumatic: Secondary | ICD-10-CM | POA: Diagnosis not present

## 2022-07-04 DIAGNOSIS — S46012A Strain of muscle(s) and tendon(s) of the rotator cuff of left shoulder, initial encounter: Secondary | ICD-10-CM | POA: Diagnosis not present

## 2022-07-04 DIAGNOSIS — M19012 Primary osteoarthritis, left shoulder: Secondary | ICD-10-CM | POA: Diagnosis not present

## 2022-07-04 LAB — POCT HEMOGLOBIN-HEMACUE: Hemoglobin: 8.9 g/dL — ABNORMAL LOW (ref 13.0–17.0)

## 2022-07-04 MED ORDER — EPOETIN ALFA 3000 UNIT/ML IJ SOLN
3000.0000 [IU] | Freq: Once | INTRAMUSCULAR | Status: AC
  Administered 2022-07-04: 3000 [IU] via SUBCUTANEOUS
  Filled 2022-07-04: qty 1

## 2022-07-04 NOTE — Progress Notes (Signed)
Diagnosis: Anemia in Chronic Kidney Disease  Provider:  Manpreet Bhutani MD  Procedure: Injection  Procrit (epoetin alfa), Dose: 3000 Units, Site: subcutaneous, Number of injections: 1  Post Care: Patient declined observation  Discharge: Condition: Good, Destination: Home . AVS provided to patient.   Performed by:  Baxter Hire, RN

## 2022-07-05 DIAGNOSIS — Z944 Liver transplant status: Secondary | ICD-10-CM | POA: Diagnosis not present

## 2022-07-05 DIAGNOSIS — N1832 Chronic kidney disease, stage 3b: Secondary | ICD-10-CM | POA: Diagnosis not present

## 2022-07-10 NOTE — Progress Notes (Signed)
Advanced Heart Failure Clinic Note   PCP: Monico Blitz, MD Nephrology: Dr. Theador Hawthorne  HF Cardiology: Dr. [name]   HPI: 67 y.o. male w/ h/o HCV s/p liver transplant in 2009 (followed at [facility]), Stage III CKD, HTN and T2DM.   He was admitted 7/21 w/ NSTEMI and Afib w/ RVR, found to have severe 3VD and biventricular heart failure, LVEF 20-25%, RV moderately reduced. Underwent CABG + MAZE + LA appendage clipping 12/19/19. Required milrinone post operatively. Post op course complicated by ATN/ renal failure. Required CVVHD but remained anuric. Tunneled HD cath placed and transitioned to iHD, MWF.  He was able to transition off HD as an outpatient.   Repeat echo done in 10/21 with EF 20-25%, moderate LV dilation, mild-moderately decreased RV systolic function, moderate MR.   Echo in 4/22 showed EF 30%, mid anterior and inferior severe hypokinesis, basal to mid anterolateral and inferolateral akinesis, mildly decreased RV systolic function.   Admitted to [facility] 05/09/22-05/14/22 with AKI and acute ischemic liver injury in setting of cocaine use. Suspected ischemic injury 2/2 hypoperfusion from coronary vasospasm. AST/ALT peaked to 4,000s and Scr up to 8.1. He required CRRT briefly (12/05-12/06). Scr improved to 2.3 (near baseline).  Echo during admit with EF 20%, mildly reduced RV, mild AI, mild MR, mild TR. GDMT held during admit but resumed at discharge.  Post hospital follow up 12/23, off all GDMT. NYHA I-II and volume stable.  Admitted 1/24 at [facility] or unintentional opiate overdose. He was placed on Narcan gtt, and for multifocal PNA and a/c CHF. Required IV Lasix for diuresis. Continued on all HF meds at d/c. There was ? regarding whether Eliquis was discontinued but pt reports he his still taking this. He does have anemia of chronic disease and is getting scheduled Procrit injections.   Follow up 1/24, continued with fatigue and weakness. Volume mildly elevated, no further cocaine use. Lasix  20 mg daily started.  Today he returns for HF follow up. Overall feeling fine. Continues with mild fatigue but breathing has greatly improved. 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 Denies palpitations, abnormal bleeding, CP, dizziness, edema, or PND/Orthopnea. Appetite ok. No fever or chills. Weight at home 138 pounds. Taking all medications. No further cocaine use.  Labs (9/21): LDL 66, HDL 44 Labs (10/21): K 3.9, creatinine 2.02 Labs (12/21): K 3.7, creatinine 1.58 Labs (9/21): LDL 66 Labs (1/22): K 4.1, creatinine 1.6 Labs (3/22): K 4.6, creatinine 1.56 => 2.09 Labs (4/22): LDL 66 Labs (8/22): K 5.5, creatinine 1.92 Labs (2/23): K 6.2, creatinine 2.36 Labs (6/23): K 5.2, creatinine 2.12 Labs (12/23): K 5.0, creatinine 2.15 Labs (1/24): K 3.9, creatinine 1.9  Review of systems complete and found to be negative unless listed in HPI.    PMH: 1. CKD stage III: He was on HD after CABG in 7/21 but renal function recovered.  2. HCV with cirrhosis, s/p liver transplant in 2009.  3. HTN 4. Psoriasis 5. Type 2 diabetes 6. Hyperlipidemia 7. CAD: NSTEMI 7/21.  - CABG (7/21) with LIMA-LAD, RIMA-ramus, sequential SVG-PDA and PLV, SVG -D 8. Atrial fibrillation: Paroxysmal.  - S/p Maze and LA appendage clip with CABG in 7/21.  9. Chronic systolic CHF: Ischemic cardiomyopathy.  - Echo (10/21): EF 20-25%, moderate LV dilation, mild-moderately decreased RV systolic function, moderate MR.  - Echo (4/22): EF 30%, mid anterior and inferior severe hypokinesis, basal to mid anterolateral and inferolateral akinesis, mildly decreased RV systolic function. - Echo (12/23) at [facility]: EF  20%, LV RWMA, mildly reduced RV, mild MR, mild AI, mild TR  Current Outpatient Medications  Medication Sig Dispense Refill   Apremilast (OTEZLA) 30 MG TABS Take 1 tablet by mouth 2 (two) times daily.     atorvastatin (LIPITOR) 80 MG tablet Take 1 tablet (80 mg total) by mouth daily. 90 tablet 3   carvedilol  (COREG) 6.25 MG tablet Take 1.5 tablets (9.375 mg total) by mouth 2 (two) times daily with a meal. 90 tablet 11   dapagliflozin propanediol (FARXIGA) 10 MG TABS tablet Take 1 tablet (10 mg total) by mouth daily before breakfast. 90 tablet 3   ELIQUIS 5 MG TABS tablet Take 1 tablet by mouth twice daily 60 tablet 6   ergocalciferol (VITAMIN D2) 1.25 MG (50000 UT) capsule Take 50,000 Units by mouth once a week.     FLUoxetine (PROZAC) 40 MG capsule Take 20 mg by mouth daily.     furosemide (LASIX) 20 MG tablet Take 1 tablet (20 mg total) by mouth daily. 90 tablet 3   LORazepam (ATIVAN) 1 MG tablet Take 1 mg by mouth 2 (two) times daily as needed (panic attacks).      sacubitril-valsartan (ENTRESTO) 24-26 MG Take 1 tablet by mouth 2 (two) times daily. 180 tablet 3   sildenafil (VIAGRA) 50 MG tablet TAKE 1 TABLET BY MOUTH AS NEEDED FOR ERECTILE DYSFUNCTION 20 tablet 0   sodium zirconium cyclosilicate (LOKELMA) 10 g PACK packet Take 10 g by mouth 2 (two) times a week.     spironolactone (ALDACTONE) 25 MG tablet Take 12.5 mg by mouth daily.     tacrolimus (PROGRAF) 1 MG capsule Take 2 tabs by mouth every morning & 1 tab every evening     triamcinolone cream (KENALOG) 0.1 % Apply 1 application  topically 2 (two) times daily.     patiromer (VELTASSA) 8.4 g packet Take 8.4 g by mouth 2 (two) times a week.     No current facility-administered medications for this encounter.   Allergies  Allergen Reactions   Morphine And Related Nausea And Vomiting   Social History   Socioeconomic History   Marital status: Widowed    Spouse name: Not on file   Number of children: Not on file   Years of education: Not on file   Highest education level: Not on file  Occupational History   Not on file  Tobacco Use   Smoking status: Former    Packs/day: 1.00    Types: Cigarettes    Quit date: 09/04/2007    Years since quitting: 14.8   Smokeless tobacco: Never  Vaping Use   Vaping Use: Never used  Substance and  Sexual Activity   Alcohol use: No   Drug use: No   Sexual activity: Yes    Birth control/protection: None  Other Topics Concern   Not on file  Social History Narrative   Not on file   Social Determinants of Health   Financial Resource Strain: Not on file  Food Insecurity: No Food Insecurity (01/05/2020)   Hunger Vital Sign    Worried About Running Out of Food in the Last Year: Never true    [place] in the Last Year: Never true  Transportation Needs: No Transportation Needs (01/05/2020)   PRAPARE - Hydrologist (Medical): No    Lack of Transportation (Non-Medical): No  Physical Activity: Not on file  Stress: Not on file  Social Connections: Not on file  Intimate Partner Violence: Not on file   Family History  Problem Relation Age of Onset   Diabetes Mother    Heart disease Father    Heart disease Son    BP 124/70   Pulse 70   Wt 63.9 kg (140 lb 12.8 oz)   SpO2 99%   BMI 22.05 kg/m   Wt Readings from Last 3 Encounters:  07/11/22 63.9 kg (140 lb 12.8 oz)  06/29/22 68.2 kg (150 lb 6.4 oz)  06/09/22 68.9 kg (152 lb)   PHYSICAL EXAM: General:  NAD. No resp difficulty, thin, chronically-ill appearing. HEENT: Normal Neck: Supple. No JVD. Carotids 2+ bilat; no bruits. No lymphadenopathy or thryomegaly appreciated. Cor: PMI nondisplaced. Regular rate & rhythm. No rubs, gallops or murmurs. Lungs: Clear Abdomen: Soft, nontender, nondistended. No hepatosplenomegaly. No bruits or masses. Good bowel sounds. Extremities: No cyanosis, clubbing, rash, edema Neuro: Alert & oriented x 3, cranial nerves grossly intact. Moves all 4 extremities w/o difficulty. Affect pleasant.  ASSESSMENT & PLAN: 1. CAD: NSTEMI in 7/21.  Suspect out of hospital inferolateral MI.  Cardiac MRI showed that the inferolateral wall was likely not viable, but the remainder of the LV myocardium was likely viable. He had CABG 12/19/19 with LIMA-LAD, RIMA-ramus, seq SVG-PD/PL,  SVG-D.  Stable w/o CP  - No ASA given Eliquis use.  - Continue atorvastatin 80 mg daily. 2. Chronic Systolic CHF: Ischemic cardiomyopathy.  Echo 7/21 with EF 20-25%, moderately decreased RV systolic function. Cardiac MRI with LV EF 26%, RV EF 38%, near full thickness scar in inferolateral wall suggesting lack of viability in the LCx territory.  Now post-CABG.  Echo in 10/21 showed EF 20-25%, moderate LV dilation, mild-moderately decreased RV systolic function, moderate MR.  Echo in 4/22 showed EF 30%, mid anterior and inferior severe hypokinesis, basal to mid anterolateral and inferolateral akinesis, mildly decreased RV systolic function.  Echo at [facility] 12/23 with EF 20%, regional WMA in LV, mildly reduced RV. Improved NYHA II symptoms. He is not volume overloaded today. - Continue Lasix 20 mg daily. BMET today. - Continue Entresto 24/26 mg bid.  - Continue spironolactone 12.5 mg daily.  - Continue Coreg 9.375 mg bid. - Continue Farxiga 10 mg daily. - He has decided that he does not want an ICD.   - Recommended he abstain from cocaine use. 3. Atrial fibrillation:  S/p Maze + LAA clipping on 7/16.  He is off amiodarone. Regular on exam today. - Continue Eliquis 5 mg bid. Denies bleeding.  4. CKD: Stage 3.  He required HD after CABG but renal function recovered. Scr baseline has been in low 2s. He is followed by nephrology - Continue Veltassa 8.4 g twice a week. - Continue Farxiga. 5. Liver transplant for HCV: Followed at [facility]. On Tacrolimus.  - Recent liver injury in setting of cocaine use. Resolved. 6. Cocaine abuse: recent use w/ subsequent unintentional opiate overdose (suspect cocaine was laced w/ narcotics) required hospialization and treatment w/ Narcan. - Discussed cessation. 7. Anemia of Chronic Disease: in setting of CKD. Getting scheduled procrit injections. ? How much symptoms are 2/2 HF vs anemia  Follow up in 3 months with Dr. [name].  [place], FNP 07/11/22

## 2022-07-11 ENCOUNTER — Ambulatory Visit (HOSPITAL_COMMUNITY)
Admission: RE | Admit: 2022-07-11 | Discharge: 2022-07-11 | Disposition: A | Discharge status: Home / Self Care | Payer: Medicare HMO | Source: Ambulatory Visit | Attending: Family Medicine | Admitting: Family Medicine

## 2022-07-11 ENCOUNTER — Encounter (HOSPITAL_COMMUNITY): Payer: Self-pay

## 2022-07-11 VITALS — BP 124/70 | HR 70 | Wt 140.8 lb

## 2022-07-11 DIAGNOSIS — Z944 Liver transplant status: Secondary | ICD-10-CM | POA: Diagnosis not present

## 2022-07-11 DIAGNOSIS — I130 Hypertensive heart and chronic kidney disease with heart failure and stage 1 through stage 4 chronic kidney disease, or unspecified chronic kidney disease: Secondary | ICD-10-CM | POA: Diagnosis not present

## 2022-07-11 DIAGNOSIS — Z79621 Long term (current) use of calcineurin inhibitor: Secondary | ICD-10-CM | POA: Insufficient documentation

## 2022-07-11 DIAGNOSIS — Z7984 Long term (current) use of oral hypoglycemic drugs: Secondary | ICD-10-CM | POA: Diagnosis not present

## 2022-07-11 DIAGNOSIS — I5021 Acute systolic (congestive) heart failure: Secondary | ICD-10-CM | POA: Insufficient documentation

## 2022-07-11 DIAGNOSIS — R5383 Other fatigue: Secondary | ICD-10-CM | POA: Diagnosis not present

## 2022-07-11 DIAGNOSIS — I480 Paroxysmal atrial fibrillation: Secondary | ICD-10-CM

## 2022-07-11 DIAGNOSIS — D631 Anemia in chronic kidney disease: Secondary | ICD-10-CM | POA: Diagnosis not present

## 2022-07-11 DIAGNOSIS — Z79899 Other long term (current) drug therapy: Secondary | ICD-10-CM | POA: Diagnosis not present

## 2022-07-11 DIAGNOSIS — Z951 Presence of aortocoronary bypass graft: Secondary | ICD-10-CM | POA: Insufficient documentation

## 2022-07-11 DIAGNOSIS — Z7901 Long term (current) use of anticoagulants: Secondary | ICD-10-CM | POA: Insufficient documentation

## 2022-07-11 DIAGNOSIS — F1410 Cocaine abuse, uncomplicated: Secondary | ICD-10-CM | POA: Diagnosis not present

## 2022-07-11 DIAGNOSIS — I2510 Atherosclerotic heart disease of native coronary artery without angina pectoris: Secondary | ICD-10-CM

## 2022-07-11 DIAGNOSIS — D649 Anemia, unspecified: Secondary | ICD-10-CM | POA: Diagnosis not present

## 2022-07-11 DIAGNOSIS — I252 Old myocardial infarction: Secondary | ICD-10-CM | POA: Diagnosis not present

## 2022-07-11 DIAGNOSIS — N183 Chronic kidney disease, stage 3 unspecified: Secondary | ICD-10-CM | POA: Diagnosis not present

## 2022-07-11 DIAGNOSIS — F1411 Cocaine abuse, in remission: Secondary | ICD-10-CM

## 2022-07-11 DIAGNOSIS — E1122 Type 2 diabetes mellitus with diabetic chronic kidney disease: Secondary | ICD-10-CM | POA: Insufficient documentation

## 2022-07-11 DIAGNOSIS — R69 Illness, unspecified: Secondary | ICD-10-CM | POA: Diagnosis not present

## 2022-07-11 DIAGNOSIS — N1832 Chronic kidney disease, stage 3b: Secondary | ICD-10-CM | POA: Diagnosis not present

## 2022-07-11 LAB — BASIC METABOLIC PANEL
Anion gap: 5 (ref 5–15)
BUN: 24 mg/dL — ABNORMAL HIGH (ref 8–23)
CO2: 24 mmol/L (ref 22–32)
Calcium: 8.5 mg/dL — ABNORMAL LOW (ref 8.9–10.3)
Chloride: 110 mmol/L (ref 98–111)
Creatinine, Ser: 1.78 mg/dL — ABNORMAL HIGH (ref 0.61–1.24)
GFR, Estimated: 42 mL/min — ABNORMAL LOW (ref >60)
Glucose, Bld: 130 mg/dL — ABNORMAL HIGH (ref 70–99)
Potassium: 3.6 mmol/L (ref 3.5–5.1)
Sodium: 139 mmol/L (ref 135–145)

## 2022-07-11 LAB — BRAIN NATRIURETIC PEPTIDE: B Natriuretic Peptide: 2072.3 pg/mL — ABNORMAL HIGH (ref 0.0–100.0)

## 2022-07-11 NOTE — Patient Instructions (Addendum)
Thank you for coming in today  Labs were done today, if any labs are abnormal the clinic will call you No news is good news  Your physician recommends that you schedule a follow-up appointment in:  3 months with Dr. Mclean You will receive a reminder letter in the mail a few months in advance. If you don't receive a letter, please call our office to schedule the follow-up appointment.    Do the following things EVERYDAY: Weigh yourself in the morning before breakfast. Write it down and keep it in a log. Take your medicines as prescribed Eat low salt foods--Limit salt (sodium) to 2000 mg per day.  Stay as active as you can everyday Limit all fluids for the day to less than 2 liters  At the Advanced Heart Failure Clinic, you and your health needs are our priority. As part of our continuing mission to provide you with exceptional heart care, we have created designated Provider Care Teams. These Care Teams include your primary Cardiologist (physician) and Advanced Practice Providers (APPs- Physician Assistants and Nurse Practitioners) who all work together to provide you with the care you need, when you need it.   You may see any of the following providers on your designated Care Team at your next follow up: Dr Daniel Bensimhon Dr Dalton McLean Dr. Aditya Sabharwal Amy Clegg, NP Brittainy Simmons, PA Jessica Milford,NP Lindsay Finch, PA Alma Diaz, NP Lauren Kemp, PharmD   Please be sure to bring in all your medications bottles to every appointment.    Thank you for choosing [facility]   If you have any questions or concerns before your next appointment please send us a message through mychart or call our office at [phone].    TO LEAVE A MESSAGE FOR THE NURSE SELECT OPTION 2, PLEASE LEAVE A MESSAGE INCLUDING: YOUR NAME DATE OF BIRTH CALL BACK NUMBER REASON FOR CALL**this is important as we prioritize the call backs  YOU WILL  RECEIVE A CALL BACK THE SAME DAY AS LONG AS YOU CALL BEFORE 4:00 PM  

## 2022-07-13 ENCOUNTER — Encounter: Payer: Self-pay | Admitting: Cardiology

## 2022-07-17 ENCOUNTER — Encounter (HOSPITAL_COMMUNITY): Admission: RE | Admit: 2022-07-17 | Payer: Medicare HMO | Source: Ambulatory Visit

## 2022-07-18 ENCOUNTER — Encounter (HOSPITAL_COMMUNITY)
Admission: RE | Admit: 2022-07-18 | Discharge: 2022-07-18 | Disposition: A | Discharge status: Home / Self Care | Payer: Medicare HMO | Source: Ambulatory Visit | Attending: Nephrology | Admitting: Nephrology

## 2022-07-18 VITALS — BP 115/72 | HR 73 | Resp 14

## 2022-07-18 DIAGNOSIS — N1832 Chronic kidney disease, stage 3b: Secondary | ICD-10-CM | POA: Insufficient documentation

## 2022-07-18 DIAGNOSIS — I5021 Acute systolic (congestive) heart failure: Secondary | ICD-10-CM | POA: Diagnosis not present

## 2022-07-18 DIAGNOSIS — D631 Anemia in chronic kidney disease: Secondary | ICD-10-CM | POA: Insufficient documentation

## 2022-07-18 DIAGNOSIS — R69 Illness, unspecified: Secondary | ICD-10-CM | POA: Diagnosis not present

## 2022-07-18 DIAGNOSIS — Z299 Encounter for prophylactic measures, unspecified: Secondary | ICD-10-CM | POA: Diagnosis not present

## 2022-07-18 DIAGNOSIS — I10 Essential (primary) hypertension: Secondary | ICD-10-CM | POA: Diagnosis not present

## 2022-07-18 DIAGNOSIS — G4700 Insomnia, unspecified: Secondary | ICD-10-CM | POA: Diagnosis not present

## 2022-07-18 LAB — POCT HEMOGLOBIN-HEMACUE: Hemoglobin: 10.3 g/dL — ABNORMAL LOW (ref 13.0–17.0)

## 2022-07-18 MED ORDER — EPOETIN ALFA 3000 UNIT/ML IJ SOLN: 3000.0000 [IU] | Freq: Once | INTRAMUSCULAR | Status: DC

## 2022-07-18 NOTE — Progress Notes (Signed)
Diagnosis: Anemia in Chronic Kidney Disease  Provider:  Manpreet Bhutani MD  Procedure: Injection  Epogen (epoetin alfa), Dose: 3000 Units, Site: subcutaneous, Number of injections: NOT GIVEN  Hgb 10.3  Post Care: Patient declined observation  Discharge: Condition: Good, Destination: Home . AVS Provided  Performed by:  Baxter Hire, RN

## 2022-07-24 DIAGNOSIS — M75102 Unspecified rotator cuff tear or rupture of left shoulder, not specified as traumatic: Secondary | ICD-10-CM | POA: Diagnosis not present

## 2022-07-26 ENCOUNTER — Other Ambulatory Visit (HOSPITAL_COMMUNITY): Payer: Self-pay | Admitting: Cardiology

## 2022-07-27 DIAGNOSIS — D849 Immunodeficiency, unspecified: Secondary | ICD-10-CM | POA: Diagnosis not present

## 2022-07-27 DIAGNOSIS — D2260 Melanocytic nevi of unspecified upper limb, including shoulder: Secondary | ICD-10-CM | POA: Diagnosis not present

## 2022-07-27 DIAGNOSIS — L570 Actinic keratosis: Secondary | ICD-10-CM | POA: Diagnosis not present

## 2022-07-27 DIAGNOSIS — L814 Other melanin hyperpigmentation: Secondary | ICD-10-CM | POA: Diagnosis not present

## 2022-07-27 DIAGNOSIS — D492 Neoplasm of unspecified behavior of bone, soft tissue, and skin: Secondary | ICD-10-CM | POA: Diagnosis not present

## 2022-07-27 DIAGNOSIS — L409 Psoriasis, unspecified: Secondary | ICD-10-CM | POA: Diagnosis not present

## 2022-07-27 DIAGNOSIS — D225 Melanocytic nevi of trunk: Secondary | ICD-10-CM | POA: Diagnosis not present

## 2022-07-27 DIAGNOSIS — D485 Neoplasm of uncertain behavior of skin: Secondary | ICD-10-CM | POA: Diagnosis not present

## 2022-07-27 DIAGNOSIS — D2270 Melanocytic nevi of unspecified lower limb, including hip: Secondary | ICD-10-CM | POA: Diagnosis not present

## 2022-07-27 DIAGNOSIS — L578 Other skin changes due to chronic exposure to nonionizing radiation: Secondary | ICD-10-CM | POA: Diagnosis not present

## 2022-07-27 DIAGNOSIS — C44519 Basal cell carcinoma of skin of other part of trunk: Secondary | ICD-10-CM | POA: Diagnosis not present

## 2022-07-27 DIAGNOSIS — Z85828 Personal history of other malignant neoplasm of skin: Secondary | ICD-10-CM | POA: Diagnosis not present

## 2022-07-28 ENCOUNTER — Encounter: Payer: Self-pay | Admitting: Cardiology

## 2022-07-31 ENCOUNTER — Encounter (HOSPITAL_COMMUNITY)
Admission: RE | Admit: 2022-07-31 | Discharge: 2022-07-31 | Disposition: A | Discharge status: Home / Self Care | Payer: Medicare HMO | Source: Ambulatory Visit | Attending: Nephrology | Admitting: Nephrology

## 2022-07-31 ENCOUNTER — Encounter (HOSPITAL_COMMUNITY): Payer: Self-pay | Admitting: Pharmacy Technician

## 2022-07-31 ENCOUNTER — Telehealth (HOSPITAL_COMMUNITY): Payer: Self-pay | Admitting: Pharmacy Technician

## 2022-07-31 VITALS — BP 114/57 | HR 72 | Resp 20

## 2022-07-31 DIAGNOSIS — N1832 Chronic kidney disease, stage 3b: Secondary | ICD-10-CM | POA: Diagnosis not present

## 2022-07-31 DIAGNOSIS — D631 Anemia in chronic kidney disease: Secondary | ICD-10-CM | POA: Diagnosis not present

## 2022-07-31 LAB — POCT HEMOGLOBIN-HEMACUE: Hemoglobin: 10.7 g/dL — ABNORMAL LOW (ref 13.0–17.0)

## 2022-07-31 MED ORDER — EPOETIN ALFA 3000 UNIT/ML IJ SOLN: 3000.0000 [IU] | Freq: Once | INTRAMUSCULAR | Status: DC

## 2022-07-31 NOTE — Addendum Note (Signed)
Encounter addended by: Cristy Friedlander, [facility] on: 07/31/2022 1:51 PM  Actions taken: Order list changed

## 2022-07-31 NOTE — Progress Notes (Signed)
Diagnosis: Anemia in Chronic Kidney Disease  Provider:  Manpreet Bhutani MD  Procedure: Injection  Epogen (epoetin alfa), Dose: 3000 Units, Site: subcutaneous, Number of injections: 0  Hgb 10.7. Injection not administered.  Post Care: Patient declined observation  Discharge: Condition: Good, Destination: Home . AVS Declined  Performed by:  Binnie Kand, RN

## 2022-07-31 NOTE — Telephone Encounter (Signed)
Advanced Heart Failure Patient Advocate Encounter  The patient was approved for a [employer] grant that will help cover the cost of Entresto Farxiga. Total amount awarded, $10,000. Eligibility, 07/30/22 - 07/30/23.  ID [id number]  BIN [id number]  PCN [id number]  Group [id number]  Sent grant information to patient via mychart.  Charlann Boxer, CPhT

## 2022-07-31 NOTE — Addendum Note (Signed)
Encounter addended by: Baxter Hire, RN on: 07/31/2022 1:24 PM  Actions taken: Therapy plan modified

## 2022-08-01 DIAGNOSIS — Z944 Liver transplant status: Secondary | ICD-10-CM | POA: Diagnosis not present

## 2022-08-02 DIAGNOSIS — Z1331 Encounter for screening for depression: Secondary | ICD-10-CM | POA: Diagnosis not present

## 2022-08-02 DIAGNOSIS — Z299 Encounter for prophylactic measures, unspecified: Secondary | ICD-10-CM | POA: Diagnosis not present

## 2022-08-02 DIAGNOSIS — E7800 Pure hypercholesterolemia, unspecified: Secondary | ICD-10-CM | POA: Diagnosis not present

## 2022-08-02 DIAGNOSIS — Z0000 Encounter for general adult medical examination without abnormal findings: Secondary | ICD-10-CM | POA: Diagnosis not present

## 2022-08-02 DIAGNOSIS — D849 Immunodeficiency, unspecified: Secondary | ICD-10-CM | POA: Diagnosis not present

## 2022-08-02 DIAGNOSIS — Z6821 Body mass index (BMI) 21.0-21.9, adult: Secondary | ICD-10-CM | POA: Diagnosis not present

## 2022-08-02 DIAGNOSIS — Z23 Encounter for immunization: Secondary | ICD-10-CM | POA: Diagnosis not present

## 2022-08-02 DIAGNOSIS — Z7189 Other specified counseling: Secondary | ICD-10-CM | POA: Diagnosis not present

## 2022-08-02 DIAGNOSIS — Z5181 Encounter for therapeutic drug level monitoring: Secondary | ICD-10-CM | POA: Diagnosis not present

## 2022-08-02 DIAGNOSIS — R5383 Other fatigue: Secondary | ICD-10-CM | POA: Diagnosis not present

## 2022-08-02 DIAGNOSIS — Z944 Liver transplant status: Secondary | ICD-10-CM | POA: Diagnosis not present

## 2022-08-02 DIAGNOSIS — Z79899 Other long term (current) drug therapy: Secondary | ICD-10-CM | POA: Diagnosis not present

## 2022-08-02 DIAGNOSIS — Z1339 Encounter for screening examination for other mental health and behavioral disorders: Secondary | ICD-10-CM | POA: Diagnosis not present

## 2022-08-07 DIAGNOSIS — M25512 Pain in left shoulder: Secondary | ICD-10-CM | POA: Diagnosis not present

## 2022-08-07 DIAGNOSIS — M75102 Unspecified rotator cuff tear or rupture of left shoulder, not specified as traumatic: Secondary | ICD-10-CM | POA: Diagnosis not present

## 2022-08-14 ENCOUNTER — Encounter (HOSPITAL_COMMUNITY): Admission: RE | Admit: 2022-08-14 | Payer: Medicare HMO | Source: Ambulatory Visit

## 2022-08-15 ENCOUNTER — Encounter (HOSPITAL_COMMUNITY)
Admission: RE | Admit: 2022-08-15 | Discharge: 2022-08-15 | Disposition: A | Discharge status: Home / Self Care | Payer: Medicare HMO | Source: Ambulatory Visit | Attending: Nephrology | Admitting: Nephrology

## 2022-08-15 VITALS — BP 105/52 | HR 81 | Temp 97.5°F | Resp 16

## 2022-08-15 DIAGNOSIS — N1832 Chronic kidney disease, stage 3b: Secondary | ICD-10-CM | POA: Insufficient documentation

## 2022-08-15 DIAGNOSIS — D631 Anemia in chronic kidney disease: Secondary | ICD-10-CM | POA: Insufficient documentation

## 2022-08-15 LAB — POCT HEMOGLOBIN-HEMACUE: Hemoglobin: 12 g/dL — ABNORMAL LOW (ref 13.0–17.0)

## 2022-08-15 MED ORDER — EPOETIN ALFA 3000 UNIT/ML IJ SOLN: 3000.0000 [IU] | Freq: Once | INTRAMUSCULAR | Status: DC

## 2022-08-15 NOTE — Addendum Note (Signed)
Encounter addended by: Baxter Hire, RN on: 08/15/2022 1:50 PM  Actions taken: Therapy plan modified

## 2022-08-15 NOTE — Progress Notes (Signed)
Diagnosis: Anemia in Chronic Kidney Disease  Provider:  Manpreet Bhutani MD  Procedure: Injection  Epogen (epoetin alfa), Dose: 3000 Units, Site: subcutaneous, Number of injections: 0  Hgb 12.0. Injection was not administered.  Post Care: Patient declined observation  Discharge: Condition: Good, Destination: Home . AVS Provided  Performed by:  Binnie Kand, RN

## 2022-08-21 DIAGNOSIS — E211 Secondary hyperparathyroidism, not elsewhere classified: Secondary | ICD-10-CM | POA: Diagnosis not present

## 2022-08-21 DIAGNOSIS — D638 Anemia in other chronic diseases classified elsewhere: Secondary | ICD-10-CM | POA: Diagnosis not present

## 2022-08-21 DIAGNOSIS — R809 Proteinuria, unspecified: Secondary | ICD-10-CM | POA: Diagnosis not present

## 2022-08-21 DIAGNOSIS — N1832 Chronic kidney disease, stage 3b: Secondary | ICD-10-CM | POA: Diagnosis not present

## 2022-08-21 DIAGNOSIS — I5022 Chronic systolic (congestive) heart failure: Secondary | ICD-10-CM | POA: Diagnosis not present

## 2022-08-21 DIAGNOSIS — B700 Diphyllobothriasis: Secondary | ICD-10-CM | POA: Diagnosis not present

## 2022-08-21 DIAGNOSIS — N170 Acute kidney failure with tubular necrosis: Secondary | ICD-10-CM | POA: Diagnosis not present

## 2022-08-23 DIAGNOSIS — E875 Hyperkalemia: Secondary | ICD-10-CM | POA: Diagnosis not present

## 2022-08-23 DIAGNOSIS — I129 Hypertensive chronic kidney disease with stage 1 through stage 4 chronic kidney disease, or unspecified chronic kidney disease: Secondary | ICD-10-CM | POA: Diagnosis not present

## 2022-08-23 DIAGNOSIS — R809 Proteinuria, unspecified: Secondary | ICD-10-CM | POA: Diagnosis not present

## 2022-08-23 DIAGNOSIS — Z944 Liver transplant status: Secondary | ICD-10-CM | POA: Diagnosis not present

## 2022-08-23 DIAGNOSIS — N1832 Chronic kidney disease, stage 3b: Secondary | ICD-10-CM | POA: Diagnosis not present

## 2022-08-23 DIAGNOSIS — E211 Secondary hyperparathyroidism, not elsewhere classified: Secondary | ICD-10-CM | POA: Diagnosis not present

## 2022-08-23 DIAGNOSIS — R031 Nonspecific low blood-pressure reading: Secondary | ICD-10-CM | POA: Diagnosis not present

## 2022-08-23 DIAGNOSIS — D638 Anemia in other chronic diseases classified elsewhere: Secondary | ICD-10-CM | POA: Diagnosis not present

## 2022-08-23 DIAGNOSIS — E559 Vitamin D deficiency, unspecified: Secondary | ICD-10-CM | POA: Diagnosis not present

## 2022-08-23 DIAGNOSIS — I5022 Chronic systolic (congestive) heart failure: Secondary | ICD-10-CM | POA: Diagnosis not present

## 2022-08-28 ENCOUNTER — Encounter (HOSPITAL_COMMUNITY)
Admission: RE | Admit: 2022-08-28 | Payer: Medicare HMO | Source: Ambulatory Visit | Attending: Nephrology | Admitting: Nephrology

## 2022-08-28 DIAGNOSIS — R69 Illness, unspecified: Secondary | ICD-10-CM | POA: Diagnosis not present

## 2022-08-28 DIAGNOSIS — R110 Nausea: Secondary | ICD-10-CM | POA: Diagnosis not present

## 2022-08-28 DIAGNOSIS — I10 Essential (primary) hypertension: Secondary | ICD-10-CM | POA: Diagnosis not present

## 2022-08-28 DIAGNOSIS — Z299 Encounter for prophylactic measures, unspecified: Secondary | ICD-10-CM | POA: Diagnosis not present

## 2022-09-11 ENCOUNTER — Encounter (HOSPITAL_COMMUNITY)
Admission: RE | Admit: 2022-09-11 | Discharge: 2022-09-11 | Disposition: A | Discharge status: Home / Self Care | Payer: Medicare HMO | Source: Ambulatory Visit | Attending: Nephrology | Admitting: Nephrology

## 2022-09-11 VITALS — BP 110/59 | HR 77 | Temp 97.5°F | Resp 16

## 2022-09-11 DIAGNOSIS — D631 Anemia in chronic kidney disease: Secondary | ICD-10-CM | POA: Insufficient documentation

## 2022-09-11 DIAGNOSIS — N1832 Chronic kidney disease, stage 3b: Secondary | ICD-10-CM | POA: Insufficient documentation

## 2022-09-11 LAB — POCT HEMOGLOBIN-HEMACUE: Hemoglobin: 12.1 g/dL — ABNORMAL LOW (ref 13.0–17.0)

## 2022-09-11 MED ORDER — EPOETIN ALFA 3000 UNIT/ML IJ SOLN: 3000.0000 [IU] | Freq: Once | INTRAMUSCULAR | Status: DC

## 2022-09-11 NOTE — Progress Notes (Signed)
Diagnosis: Anemia in Chronic Kidney Disease  Provider:  Manpreet Bhutani MD  Procedure: Injection  Epogen (epoetin alfa), Dose: 3000 Units, Site: subcutaneous, Number of injections: Not Given  Hgb 12.1  Post Care: Patient declined observation  Discharge: Condition: Good, Destination: Home . AVS Declined  Performed by:  Evelena Peat, RN

## 2022-09-18 ENCOUNTER — Encounter (HOSPITAL_COMMUNITY): Payer: Medicare HMO

## 2022-09-25 ENCOUNTER — Telehealth: Payer: Self-pay

## 2022-09-25 ENCOUNTER — Encounter (HOSPITAL_COMMUNITY)
Admission: RE | Admit: 2022-09-25 | Discharge: 2022-09-25 | Disposition: A | Discharge status: Home / Self Care | Payer: Medicare HMO | Source: Ambulatory Visit | Attending: Nephrology | Admitting: Nephrology

## 2022-09-25 VITALS — BP 113/63 | HR 74 | Temp 97.7°F | Resp 16

## 2022-09-25 DIAGNOSIS — N1832 Chronic kidney disease, stage 3b: Secondary | ICD-10-CM | POA: Diagnosis not present

## 2022-09-25 DIAGNOSIS — D631 Anemia in chronic kidney disease: Secondary | ICD-10-CM | POA: Diagnosis not present

## 2022-09-25 LAB — POCT HEMOGLOBIN-HEMACUE: Hemoglobin: 13 g/dL (ref 13.0–17.0)

## 2022-09-25 MED ORDER — EPOETIN ALFA 3000 UNIT/ML IJ SOLN: 3000.0000 [IU] | Freq: Once | INTRAMUSCULAR | Status: DC

## 2022-09-25 NOTE — Telephone Encounter (Signed)
Called patient to reschedule missed appointment. No answer. Left HIPAA-compliant voicemail requesting call back. Will also send MyChart message.  Fraida Veldman E Kalayah Leske, RN  

## 2022-09-25 NOTE — Progress Notes (Signed)
Diagnosis: Anemia in Chronic Kidney Disease  Provider:  Manpreet Bhutani MD  Procedure: Injection  Epogen (epoetin alfa), Dose: 3000 Units, Site: subcutaneous, Number of injections: 0  Hgb 13.0. Injection not administered.  Post Care: Patient declined observation  Discharge: Condition: Stable, Destination: Home . AVS Declined  Performed by:  Evelena Peat, RN

## 2022-09-27 DIAGNOSIS — I10 Essential (primary) hypertension: Secondary | ICD-10-CM | POA: Diagnosis not present

## 2022-09-27 DIAGNOSIS — J020 Streptococcal pharyngitis: Secondary | ICD-10-CM | POA: Diagnosis not present

## 2022-09-27 DIAGNOSIS — Z299 Encounter for prophylactic measures, unspecified: Secondary | ICD-10-CM | POA: Diagnosis not present

## 2022-09-27 DIAGNOSIS — J329 Chronic sinusitis, unspecified: Secondary | ICD-10-CM | POA: Diagnosis not present

## 2022-09-27 DIAGNOSIS — F419 Anxiety disorder, unspecified: Secondary | ICD-10-CM | POA: Diagnosis not present

## 2022-09-27 DIAGNOSIS — U071 COVID-19: Secondary | ICD-10-CM | POA: Diagnosis not present

## 2022-10-09 ENCOUNTER — Encounter (HOSPITAL_COMMUNITY)
Admission: RE | Admit: 2022-10-09 | Payer: Medicare HMO | Source: Ambulatory Visit | Attending: Nephrology | Admitting: Nephrology

## 2022-10-12 ENCOUNTER — Ambulatory Visit (HOSPITAL_COMMUNITY)
Admission: RE | Admit: 2022-10-12 | Discharge: 2022-10-12 | Disposition: A | Discharge status: Home / Self Care | Payer: Medicare HMO | Source: Ambulatory Visit | Attending: Nephrology | Admitting: Nephrology

## 2022-10-12 VITALS — BP 117/68 | HR 80 | Temp 97.6°F | Resp 20

## 2022-10-12 DIAGNOSIS — N1832 Chronic kidney disease, stage 3b: Secondary | ICD-10-CM | POA: Insufficient documentation

## 2022-10-12 DIAGNOSIS — D631 Anemia in chronic kidney disease: Secondary | ICD-10-CM | POA: Diagnosis not present

## 2022-10-12 LAB — POCT HEMOGLOBIN-HEMACUE: Hemoglobin: 12.5 g/dL — ABNORMAL LOW (ref 13.0–17.0)

## 2022-10-12 MED ORDER — EPOETIN ALFA 3000 UNIT/ML IJ SOLN: 3000.0000 [IU] | Freq: Once | INTRAMUSCULAR | Status: DC

## 2022-10-12 NOTE — Progress Notes (Signed)
Diagnosis: Anemia in Chronic Kidney Disease  Provider:  Manpreet Bhutani MD  Procedure: Injection  Epogen (epoetin alfa), Dose: 3000 Units, Site: subcutaneous, Number of injections: 0  Hgb 12.5. Injection not administered.  Post Care: Patient declined observation  Discharge: Condition: Stable, Destination: Home . AVS Declined  Performed by:  Wyvonne Lenz, RN

## 2022-10-12 NOTE — Addendum Note (Signed)
Encounter addended by: Tad Moore, [facility] on: 10/12/2022 3:57 PM  Actions taken: Order list changed

## 2022-10-18 ENCOUNTER — Other Ambulatory Visit (HOSPITAL_COMMUNITY): Payer: Self-pay | Admitting: Cardiology

## 2022-10-23 ENCOUNTER — Encounter (HOSPITAL_COMMUNITY): Payer: Medicare HMO

## 2022-10-24 DIAGNOSIS — E875 Hyperkalemia: Secondary | ICD-10-CM | POA: Diagnosis not present

## 2022-10-24 DIAGNOSIS — L570 Actinic keratosis: Secondary | ICD-10-CM | POA: Diagnosis not present

## 2022-10-24 DIAGNOSIS — D485 Neoplasm of uncertain behavior of skin: Secondary | ICD-10-CM | POA: Diagnosis not present

## 2022-10-24 DIAGNOSIS — N1832 Chronic kidney disease, stage 3b: Secondary | ICD-10-CM | POA: Diagnosis not present

## 2022-10-24 DIAGNOSIS — Z1283 Encounter for screening for malignant neoplasm of skin: Secondary | ICD-10-CM | POA: Diagnosis not present

## 2022-10-24 DIAGNOSIS — Z85828 Personal history of other malignant neoplasm of skin: Secondary | ICD-10-CM | POA: Diagnosis not present

## 2022-10-27 DIAGNOSIS — Z299 Encounter for prophylactic measures, unspecified: Secondary | ICD-10-CM | POA: Diagnosis not present

## 2022-10-27 DIAGNOSIS — I25738 Atherosclerosis of nonautologous biological coronary artery bypass graft(s) with other forms of angina pectoris: Secondary | ICD-10-CM | POA: Diagnosis not present

## 2022-10-27 DIAGNOSIS — Z0000 Encounter for general adult medical examination without abnormal findings: Secondary | ICD-10-CM | POA: Diagnosis not present

## 2022-10-27 DIAGNOSIS — F339 Major depressive disorder, recurrent, unspecified: Secondary | ICD-10-CM | POA: Diagnosis not present

## 2022-10-27 DIAGNOSIS — I10 Essential (primary) hypertension: Secondary | ICD-10-CM | POA: Diagnosis not present

## 2022-10-31 ENCOUNTER — Encounter (HOSPITAL_COMMUNITY): Payer: Medicare HMO

## 2022-11-01 ENCOUNTER — Encounter (HOSPITAL_COMMUNITY)
Admission: RE | Admit: 2022-11-01 | Discharge: 2022-11-01 | Disposition: A | Discharge status: Home / Self Care | Payer: Medicare HMO | Source: Ambulatory Visit | Attending: Nephrology | Admitting: Nephrology

## 2022-11-01 VITALS — BP 104/64 | HR 74 | Temp 97.3°F | Resp 20

## 2022-11-01 DIAGNOSIS — D631 Anemia in chronic kidney disease: Secondary | ICD-10-CM

## 2022-11-01 DIAGNOSIS — E559 Vitamin D deficiency, unspecified: Secondary | ICD-10-CM | POA: Diagnosis not present

## 2022-11-01 DIAGNOSIS — Z944 Liver transplant status: Secondary | ICD-10-CM | POA: Diagnosis not present

## 2022-11-01 DIAGNOSIS — I129 Hypertensive chronic kidney disease with stage 1 through stage 4 chronic kidney disease, or unspecified chronic kidney disease: Secondary | ICD-10-CM | POA: Diagnosis not present

## 2022-11-01 DIAGNOSIS — N1832 Chronic kidney disease, stage 3b: Secondary | ICD-10-CM | POA: Insufficient documentation

## 2022-11-01 DIAGNOSIS — D638 Anemia in other chronic diseases classified elsewhere: Secondary | ICD-10-CM | POA: Diagnosis not present

## 2022-11-01 MED ORDER — EPOETIN ALFA 3000 UNIT/ML IJ SOLN: 3000.0000 [IU] | Freq: Once | INTRAMUSCULAR | Status: DC

## 2022-11-01 NOTE — Addendum Note (Signed)
Encounter addended by: Wyvonne Lenz, RN on: 11/01/2022 2:50 PM  Actions taken: Clinical Note Signed

## 2022-11-01 NOTE — Progress Notes (Signed)
Patient presented today for hemoglobin check/Procrit injection. Stated he had recently had labs completed at [facility]). Per chart review, patient's hemoglobin on 10/24/22 was 13.9. Treatment cancelled & procrit held today per order, given Hgb > 10 from recent lab work. Called Dr. Lucio Edward office ([facility]) and confirmed that no additional labs were needed.  Wyvonne Lenz, RN

## 2022-11-01 NOTE — Addendum Note (Signed)
Encounter addended by: Wyvonne Lenz, RN on: 11/01/2022 1:44 PM  Actions taken: Therapy plan modified

## 2022-11-01 NOTE — Addendum Note (Signed)
Encounter addended by: Tad Moore, [facility] on: 11/01/2022 1:44 PM  Actions taken: Order list changed

## 2022-11-06 ENCOUNTER — Encounter (HOSPITAL_COMMUNITY): Payer: Medicare HMO

## 2022-11-06 DIAGNOSIS — M25512 Pain in left shoulder: Secondary | ICD-10-CM | POA: Diagnosis not present

## 2022-11-06 DIAGNOSIS — M75102 Unspecified rotator cuff tear or rupture of left shoulder, not specified as traumatic: Secondary | ICD-10-CM | POA: Diagnosis not present

## 2022-11-13 ENCOUNTER — Other Ambulatory Visit (HOSPITAL_COMMUNITY): Payer: Self-pay | Admitting: Cardiology

## 2022-11-14 DIAGNOSIS — B998 Other infectious disease: Secondary | ICD-10-CM | POA: Diagnosis not present

## 2022-11-14 DIAGNOSIS — D849 Immunodeficiency, unspecified: Secondary | ICD-10-CM | POA: Diagnosis not present

## 2022-11-16 ENCOUNTER — Other Ambulatory Visit (HOSPITAL_COMMUNITY): Payer: Self-pay | Admitting: Cardiology

## 2022-11-20 ENCOUNTER — Encounter (HOSPITAL_COMMUNITY): Admission: RE | Admit: 2022-11-20 | Payer: Medicare HMO | Source: Ambulatory Visit

## 2022-11-22 ENCOUNTER — Encounter (HOSPITAL_COMMUNITY)
Admission: RE | Admit: 2022-11-22 | Discharge: 2022-11-22 | Disposition: A | Discharge status: Home / Self Care | Payer: Medicare HMO | Source: Ambulatory Visit | Attending: Nephrology | Admitting: Nephrology

## 2022-11-22 VITALS — BP 103/86 | HR 75 | Temp 97.9°F | Resp 18

## 2022-11-22 DIAGNOSIS — N1832 Chronic kidney disease, stage 3b: Secondary | ICD-10-CM | POA: Insufficient documentation

## 2022-11-22 DIAGNOSIS — D631 Anemia in chronic kidney disease: Secondary | ICD-10-CM | POA: Insufficient documentation

## 2022-11-22 LAB — POCT HEMOGLOBIN-HEMACUE: Hemoglobin: 12.6 g/dL — ABNORMAL LOW (ref 13.0–17.0)

## 2022-11-22 MED ORDER — EPOETIN ALFA 3000 UNIT/ML IJ SOLN: 3000.0000 [IU] | Freq: Once | INTRAMUSCULAR | Status: DC

## 2022-11-22 NOTE — Progress Notes (Signed)
Hgb 12.6 - no injection needed today

## 2022-12-11 ENCOUNTER — Encounter (HOSPITAL_COMMUNITY)
Admission: RE | Admit: 2022-12-11 | Discharge: 2022-12-11 | Disposition: A | Discharge status: Home / Self Care | Payer: Medicare HMO | Source: Ambulatory Visit | Attending: Nephrology | Admitting: Nephrology

## 2022-12-11 DIAGNOSIS — D631 Anemia in chronic kidney disease: Secondary | ICD-10-CM | POA: Insufficient documentation

## 2022-12-11 DIAGNOSIS — N1832 Chronic kidney disease, stage 3b: Secondary | ICD-10-CM | POA: Insufficient documentation

## 2022-12-14 DIAGNOSIS — S8391XA Sprain of unspecified site of right knee, initial encounter: Secondary | ICD-10-CM | POA: Diagnosis not present

## 2022-12-14 DIAGNOSIS — I10 Essential (primary) hypertension: Secondary | ICD-10-CM | POA: Diagnosis not present

## 2022-12-14 DIAGNOSIS — S8392XA Sprain of unspecified site of left knee, initial encounter: Secondary | ICD-10-CM | POA: Diagnosis not present

## 2022-12-14 DIAGNOSIS — I5021 Acute systolic (congestive) heart failure: Secondary | ICD-10-CM | POA: Diagnosis not present

## 2022-12-14 DIAGNOSIS — Z299 Encounter for prophylactic measures, unspecified: Secondary | ICD-10-CM | POA: Diagnosis not present

## 2022-12-20 ENCOUNTER — Other Ambulatory Visit (HOSPITAL_COMMUNITY): Payer: Self-pay | Admitting: Cardiology

## 2022-12-25 ENCOUNTER — Encounter (HOSPITAL_COMMUNITY): Admission: RE | Admit: 2022-12-25 | Payer: Medicare HMO | Source: Ambulatory Visit

## 2022-12-26 ENCOUNTER — Encounter (HOSPITAL_COMMUNITY)
Admission: RE | Admit: 2022-12-26 | Discharge: 2022-12-26 | Disposition: A | Discharge status: Home / Self Care | Payer: Medicare HMO | Source: Ambulatory Visit | Attending: Nephrology | Admitting: Nephrology

## 2022-12-26 VITALS — BP 122/62 | HR 66 | Temp 98.0°F | Resp 18

## 2022-12-26 DIAGNOSIS — N1832 Chronic kidney disease, stage 3b: Secondary | ICD-10-CM

## 2022-12-26 DIAGNOSIS — D631 Anemia in chronic kidney disease: Secondary | ICD-10-CM | POA: Diagnosis not present

## 2022-12-26 LAB — POCT HEMOGLOBIN-HEMACUE: Hemoglobin: 12.3 g/dL — ABNORMAL LOW (ref 13.0–17.0)

## 2022-12-26 MED ORDER — EPOETIN ALFA 3000 UNIT/ML IJ SOLN: 3000.0000 [IU] | Freq: Once | INTRAMUSCULAR | Status: DC

## 2022-12-26 NOTE — Addendum Note (Signed)
Encounter addended by: Tad Moore, RPH on: 12/26/2022 1:25 PM  Actions taken: Order list changed

## 2022-12-26 NOTE — Progress Notes (Signed)
Hgb. 12.3 injection not needed.

## 2022-12-27 DIAGNOSIS — M1712 Unilateral primary osteoarthritis, left knee: Secondary | ICD-10-CM | POA: Diagnosis not present

## 2022-12-27 DIAGNOSIS — Z299 Encounter for prophylactic measures, unspecified: Secondary | ICD-10-CM | POA: Diagnosis not present

## 2022-12-27 DIAGNOSIS — I10 Essential (primary) hypertension: Secondary | ICD-10-CM | POA: Diagnosis not present

## 2022-12-27 DIAGNOSIS — F419 Anxiety disorder, unspecified: Secondary | ICD-10-CM | POA: Diagnosis not present

## 2022-12-27 DIAGNOSIS — I509 Heart failure, unspecified: Secondary | ICD-10-CM | POA: Diagnosis not present

## 2023-01-05 DIAGNOSIS — L853 Xerosis cutis: Secondary | ICD-10-CM | POA: Diagnosis not present

## 2023-01-05 DIAGNOSIS — S40812A Abrasion of left upper arm, initial encounter: Secondary | ICD-10-CM | POA: Diagnosis not present

## 2023-01-05 DIAGNOSIS — Z299 Encounter for prophylactic measures, unspecified: Secondary | ICD-10-CM | POA: Diagnosis not present

## 2023-01-08 ENCOUNTER — Encounter (HOSPITAL_COMMUNITY): Admission: RE | Admit: 2023-01-08 | Payer: Medicare HMO | Source: Ambulatory Visit

## 2023-01-08 ENCOUNTER — Ambulatory Visit: Payer: Medicare HMO

## 2023-01-08 ENCOUNTER — Other Ambulatory Visit: Payer: Self-pay

## 2023-01-08 ENCOUNTER — Telehealth: Payer: Self-pay | Admitting: Pharmacy Technician

## 2023-01-08 NOTE — Telephone Encounter (Signed)
Auth Submission: APPROVED Site of care: Site of care: [facility] Payer: [employer] Medication & CPT/J Code(s) submitted:  [id number] Route of submission (phone, fax, portal):  Phone # Fax # Auth type: Buy/Bill PB Units/visits requested:  Reference number: [id number] Approval from: 01/03/23 to 03/28/23

## 2023-01-09 ENCOUNTER — Ambulatory Visit: Payer: Medicare HMO

## 2023-01-10 ENCOUNTER — Other Ambulatory Visit (HOSPITAL_COMMUNITY): Payer: Self-pay | Admitting: Cardiology

## 2023-01-10 ENCOUNTER — Encounter (HOSPITAL_COMMUNITY): Payer: Medicare HMO | Attending: Nephrology | Admitting: *Deleted

## 2023-01-10 DIAGNOSIS — Z944 Liver transplant status: Secondary | ICD-10-CM | POA: Diagnosis not present

## 2023-01-10 DIAGNOSIS — N1832 Chronic kidney disease, stage 3b: Secondary | ICD-10-CM | POA: Insufficient documentation

## 2023-01-10 DIAGNOSIS — D638 Anemia in other chronic diseases classified elsewhere: Secondary | ICD-10-CM | POA: Diagnosis not present

## 2023-01-10 DIAGNOSIS — D631 Anemia in chronic kidney disease: Secondary | ICD-10-CM | POA: Insufficient documentation

## 2023-01-10 DIAGNOSIS — N184 Chronic kidney disease, stage 4 (severe): Secondary | ICD-10-CM | POA: Diagnosis not present

## 2023-01-10 DIAGNOSIS — B700 Diphyllobothriasis: Secondary | ICD-10-CM | POA: Diagnosis not present

## 2023-01-10 DIAGNOSIS — I5022 Chronic systolic (congestive) heart failure: Secondary | ICD-10-CM | POA: Diagnosis not present

## 2023-01-10 NOTE — Progress Notes (Addendum)
Hgb.  13.9 no injection needed.

## 2023-01-12 DIAGNOSIS — Z944 Liver transplant status: Secondary | ICD-10-CM | POA: Diagnosis not present

## 2023-01-12 DIAGNOSIS — I129 Hypertensive chronic kidney disease with stage 1 through stage 4 chronic kidney disease, or unspecified chronic kidney disease: Secondary | ICD-10-CM | POA: Diagnosis not present

## 2023-01-12 DIAGNOSIS — N1831 Chronic kidney disease, stage 3a: Secondary | ICD-10-CM | POA: Diagnosis not present

## 2023-01-12 DIAGNOSIS — I5022 Chronic systolic (congestive) heart failure: Secondary | ICD-10-CM | POA: Diagnosis not present

## 2023-01-18 DIAGNOSIS — M25562 Pain in left knee: Secondary | ICD-10-CM | POA: Diagnosis not present

## 2023-01-19 ENCOUNTER — Other Ambulatory Visit (HOSPITAL_COMMUNITY): Payer: Self-pay | Admitting: Cardiology

## 2023-01-23 ENCOUNTER — Telehealth: Payer: Self-pay | Admitting: Pharmacy Technician

## 2023-01-23 NOTE — Telephone Encounter (Signed)
Auth Submission: APPROVED Site of care: Site of care: [facility] Payer: [employer] MEDICARE Medication & CPT/J Code(s) submitted: MVHQIO N6295 Route of submission (phone, fax, portal): PORTAL Phone # Fax # Auth type: Buy/Bill PB Units/visits requested: 3000 UNITS Q2WKS Reference number: [id number] Approval from: 01/03/23 to 04/05/23

## 2023-01-24 ENCOUNTER — Ambulatory Visit: Payer: Medicare HMO

## 2023-01-24 ENCOUNTER — Other Ambulatory Visit (HOSPITAL_COMMUNITY): Payer: Self-pay | Admitting: Family Medicine

## 2023-01-29 DIAGNOSIS — Z944 Liver transplant status: Secondary | ICD-10-CM | POA: Diagnosis not present

## 2023-01-29 DIAGNOSIS — D849 Immunodeficiency, unspecified: Secondary | ICD-10-CM | POA: Diagnosis not present

## 2023-01-29 DIAGNOSIS — B998 Other infectious disease: Secondary | ICD-10-CM | POA: Diagnosis not present

## 2023-02-01 DIAGNOSIS — Z5181 Encounter for therapeutic drug level monitoring: Secondary | ICD-10-CM | POA: Diagnosis not present

## 2023-02-01 DIAGNOSIS — D849 Immunodeficiency, unspecified: Secondary | ICD-10-CM | POA: Diagnosis not present

## 2023-02-01 DIAGNOSIS — Z2989 Encounter for other specified prophylactic measures: Secondary | ICD-10-CM | POA: Diagnosis not present

## 2023-02-01 DIAGNOSIS — Z944 Liver transplant status: Secondary | ICD-10-CM | POA: Diagnosis not present

## 2023-02-07 ENCOUNTER — Encounter: Payer: Self-pay | Admitting: Nephrology

## 2023-02-07 NOTE — Addendum Note (Signed)
Addended by: Marin Shutter E on: 02/07/2023 02:19 PM   Modules accepted: Orders

## 2023-02-08 DIAGNOSIS — M75102 Unspecified rotator cuff tear or rupture of left shoulder, not specified as traumatic: Secondary | ICD-10-CM | POA: Diagnosis not present

## 2023-02-08 DIAGNOSIS — M2559 Pain in other specified joint: Secondary | ICD-10-CM | POA: Diagnosis not present

## 2023-02-08 DIAGNOSIS — M2489 Other specific joint derangement of other specified joint, not elsewhere classified: Secondary | ICD-10-CM | POA: Diagnosis not present

## 2023-02-08 DIAGNOSIS — M25512 Pain in left shoulder: Secondary | ICD-10-CM | POA: Diagnosis not present

## 2023-02-09 ENCOUNTER — Telehealth (HOSPITAL_COMMUNITY): Payer: Self-pay | Admitting: *Deleted

## 2023-02-09 DIAGNOSIS — I5021 Acute systolic (congestive) heart failure: Secondary | ICD-10-CM

## 2023-02-09 NOTE — Telephone Encounter (Signed)
  [facility]   Pre-operative Risk Assessment       Request for Surgical Clearance :   Procedure:   Left Shoulder Arthroscopy  Date of Surgery:  Clearance TBD                                Surgeon:  Vonna Kotyk Surgeon's Group or Practice Name:  [facility] Phone number:  [phone] Fax number:  [phone]   Type of Clearance Requested:   - Medical    Type of Anesthesia:  Not Indicated   Additional requests/questions:    Last seen in Feb 2024, forwarded to Dr Shirlee Latch for review  SignedMeredith Staggers   02/09/2023, 9:57 AM

## 2023-02-12 NOTE — Telephone Encounter (Signed)
Would ideally see him with echo + office visit prior to surgery.  He is overdue for followup.

## 2023-02-13 ENCOUNTER — Encounter: Payer: Self-pay | Admitting: Cardiology

## 2023-02-13 DIAGNOSIS — F419 Anxiety disorder, unspecified: Secondary | ICD-10-CM | POA: Diagnosis not present

## 2023-02-13 DIAGNOSIS — I509 Heart failure, unspecified: Secondary | ICD-10-CM | POA: Diagnosis not present

## 2023-02-13 DIAGNOSIS — B998 Other infectious disease: Secondary | ICD-10-CM | POA: Diagnosis not present

## 2023-02-13 DIAGNOSIS — Z01818 Encounter for other preprocedural examination: Secondary | ICD-10-CM | POA: Diagnosis not present

## 2023-02-13 DIAGNOSIS — D849 Immunodeficiency, unspecified: Secondary | ICD-10-CM | POA: Diagnosis not present

## 2023-02-13 DIAGNOSIS — Z299 Encounter for prophylactic measures, unspecified: Secondary | ICD-10-CM | POA: Diagnosis not present

## 2023-02-13 DIAGNOSIS — I10 Essential (primary) hypertension: Secondary | ICD-10-CM | POA: Diagnosis not present

## 2023-02-13 NOTE — Telephone Encounter (Signed)
Echo order placed, will schedule

## 2023-02-14 DIAGNOSIS — D485 Neoplasm of uncertain behavior of skin: Secondary | ICD-10-CM | POA: Diagnosis not present

## 2023-02-14 DIAGNOSIS — D235 Other benign neoplasm of skin of trunk: Secondary | ICD-10-CM | POA: Diagnosis not present

## 2023-02-14 DIAGNOSIS — Z8520 Personal history of malignant neoplasm of unspecified respiratory organ: Secondary | ICD-10-CM | POA: Diagnosis not present

## 2023-02-14 DIAGNOSIS — Z1283 Encounter for screening for malignant neoplasm of skin: Secondary | ICD-10-CM | POA: Diagnosis not present

## 2023-02-14 DIAGNOSIS — L409 Psoriasis, unspecified: Secondary | ICD-10-CM | POA: Diagnosis not present

## 2023-02-14 DIAGNOSIS — L570 Actinic keratosis: Secondary | ICD-10-CM | POA: Diagnosis not present

## 2023-02-15 ENCOUNTER — Telehealth (HOSPITAL_COMMUNITY): Payer: Self-pay | Admitting: Vascular Surgery

## 2023-02-15 NOTE — Telephone Encounter (Signed)
Lvm to make echo/app/np appt

## 2023-02-20 NOTE — Telephone Encounter (Signed)
Have left pt multiple message to sch echo and f/u appt with no response, mychart message also sent. Info sent to [facility]

## 2023-03-05 ENCOUNTER — Telehealth (HOSPITAL_COMMUNITY): Payer: Self-pay

## 2023-03-05 ENCOUNTER — Other Ambulatory Visit: Payer: Self-pay | Admitting: *Deleted

## 2023-03-05 MED ORDER — APIXABAN 5 MG PO TABS: 5.0000 mg | ORAL_TABLET | Freq: Two times a day (BID) | ORAL | 1 refills | Status: DC

## 2023-03-05 NOTE — Telephone Encounter (Signed)
Prescription refill request for Eliquis received. Indication: AF Last office visit: 07/11/22  Everlean Alstrom FNP Scr: 1.62 on 02/13/23  Epic Age: 67 Weight: 63.9kg  Based on above findings Eliquis 5mg  twice daily is the appropriate dose.  Refill approved x 2.  Pt is appt due to see dr Shirlee Latch.  Message sent to schedulers.

## 2023-03-05 NOTE — Telephone Encounter (Signed)
Medication Samples have been provided to the patient.  Drug name: Eliquis       Strength: 5 mg        Qty: 4  LOT: [id number]  Exp.Date: 03/2024  Dosing instructions: Take 1 tablet Twice daily   The patient has been instructed regarding the correct time, dose, and frequency of taking this medication, including desired effects and most common side effects.   Smitty Cords Temika Sutphin 3:20 PM 03/05/2023

## 2023-03-06 ENCOUNTER — Telehealth (HOSPITAL_COMMUNITY): Payer: Self-pay | Admitting: Cardiology

## 2023-03-06 ENCOUNTER — Other Ambulatory Visit (HOSPITAL_COMMUNITY): Payer: Self-pay | Admitting: Cardiology

## 2023-03-06 NOTE — Telephone Encounter (Signed)
Pt need asst w/cost of eliquis

## 2023-03-06 NOTE — Telephone Encounter (Signed)
Patient will be provided with samples throughout the remainder of the year. Does not qualify for assistance at this time.

## 2023-03-15 DIAGNOSIS — M25562 Pain in left knee: Secondary | ICD-10-CM | POA: Diagnosis not present

## 2023-03-15 DIAGNOSIS — M75102 Unspecified rotator cuff tear or rupture of left shoulder, not specified as traumatic: Secondary | ICD-10-CM | POA: Diagnosis not present

## 2023-03-15 DIAGNOSIS — M25512 Pain in left shoulder: Secondary | ICD-10-CM | POA: Diagnosis not present

## 2023-03-30 DIAGNOSIS — M25562 Pain in left knee: Secondary | ICD-10-CM | POA: Diagnosis not present

## 2023-04-11 DIAGNOSIS — N1831 Chronic kidney disease, stage 3a: Secondary | ICD-10-CM | POA: Diagnosis not present

## 2023-04-11 DIAGNOSIS — R809 Proteinuria, unspecified: Secondary | ICD-10-CM | POA: Diagnosis not present

## 2023-04-11 DIAGNOSIS — N2581 Secondary hyperparathyroidism of renal origin: Secondary | ICD-10-CM | POA: Diagnosis not present

## 2023-04-11 DIAGNOSIS — N189 Chronic kidney disease, unspecified: Secondary | ICD-10-CM | POA: Diagnosis not present

## 2023-04-11 DIAGNOSIS — E875 Hyperkalemia: Secondary | ICD-10-CM | POA: Diagnosis not present

## 2023-04-11 DIAGNOSIS — I5042 Chronic combined systolic (congestive) and diastolic (congestive) heart failure: Secondary | ICD-10-CM | POA: Diagnosis not present

## 2023-04-22 DIAGNOSIS — M25562 Pain in left knee: Secondary | ICD-10-CM | POA: Diagnosis not present

## 2023-04-23 ENCOUNTER — Ambulatory Visit (HOSPITAL_COMMUNITY)
Admission: RE | Admit: 2023-04-23 | Discharge: 2023-04-23 | Disposition: A | Discharge status: Home / Self Care | Payer: Medicare HMO | Source: Ambulatory Visit | Attending: Cardiology | Admitting: Cardiology

## 2023-04-23 ENCOUNTER — Encounter (HOSPITAL_COMMUNITY): Payer: Self-pay | Admitting: Cardiology

## 2023-04-23 ENCOUNTER — Ambulatory Visit (HOSPITAL_BASED_OUTPATIENT_CLINIC_OR_DEPARTMENT_OTHER)
Admission: RE | Admit: 2023-04-23 | Discharge: 2023-04-23 | Disposition: A | Discharge status: Home / Self Care | Payer: Medicare HMO | Source: Ambulatory Visit | Attending: Cardiology | Admitting: Cardiology

## 2023-04-23 VITALS — BP 130/70 | HR 70 | Wt 173.4 lb

## 2023-04-23 DIAGNOSIS — I5022 Chronic systolic (congestive) heart failure: Secondary | ICD-10-CM

## 2023-04-23 DIAGNOSIS — I083 Combined rheumatic disorders of mitral, aortic and tricuspid valves: Secondary | ICD-10-CM | POA: Insufficient documentation

## 2023-04-23 DIAGNOSIS — I509 Heart failure, unspecified: Secondary | ICD-10-CM | POA: Diagnosis not present

## 2023-04-23 DIAGNOSIS — I5021 Acute systolic (congestive) heart failure: Secondary | ICD-10-CM

## 2023-04-23 DIAGNOSIS — E119 Type 2 diabetes mellitus without complications: Secondary | ICD-10-CM | POA: Insufficient documentation

## 2023-04-23 DIAGNOSIS — I110 Hypertensive heart disease with heart failure: Secondary | ICD-10-CM | POA: Diagnosis not present

## 2023-04-23 DIAGNOSIS — I2510 Atherosclerotic heart disease of native coronary artery without angina pectoris: Secondary | ICD-10-CM | POA: Insufficient documentation

## 2023-04-23 LAB — BASIC METABOLIC PANEL
Anion gap: 10 (ref 5–15)
BUN: 26 mg/dL — ABNORMAL HIGH (ref 8–23)
CO2: 23 mmol/L (ref 22–32)
Calcium: 9.2 mg/dL (ref 8.9–10.3)
Chloride: 106 mmol/L (ref 98–111)
Creatinine, Ser: 1.35 mg/dL — ABNORMAL HIGH (ref 0.61–1.24)
GFR, Estimated: 58 mL/min — ABNORMAL LOW (ref >60)
Glucose, Bld: 98 mg/dL (ref 70–99)
Potassium: 5 mmol/L (ref 3.5–5.1)
Sodium: 139 mmol/L (ref 135–145)

## 2023-04-23 LAB — CBC
HCT: 39.4 % (ref 39.0–52.0)
Hemoglobin: 12.6 g/dL — ABNORMAL LOW (ref 13.0–17.0)
MCH: 29.1 pg (ref 26.0–34.0)
MCHC: 32 g/dL (ref 30.0–36.0)
MCV: 91 fL (ref 80.0–100.0)
Platelets: 172 10*3/uL (ref 150–400)
RBC: 4.33 MIL/uL (ref 4.22–5.81)
RDW: 14.6 % (ref 11.5–15.5)
WBC: 8.7 10*3/uL (ref 4.0–10.5)
nRBC: 0 % (ref 0.0–0.2)

## 2023-04-23 LAB — LIPID PANEL
Cholesterol: 186 mg/dL (ref 0–200)
HDL: 86 mg/dL (ref >40)
LDL Cholesterol: 90 mg/dL (ref 0–99)
Total CHOL/HDL Ratio: 2.2 {ratio}
Triglycerides: 49 mg/dL (ref <150)
VLDL: 10 mg/dL (ref 0–40)

## 2023-04-23 LAB — BRAIN NATRIURETIC PEPTIDE: B Natriuretic Peptide: 1314.1 pg/mL — ABNORMAL HIGH (ref 0.0–100.0)

## 2023-04-23 MED ORDER — CARVEDILOL 12.5 MG PO TABS: 12.5000 mg | ORAL_TABLET | Freq: Two times a day (BID) | ORAL | 3 refills | Status: DC

## 2023-04-23 NOTE — Patient Instructions (Signed)
INCREASE Carvedilol to 12.5 mg Twice daily  Labs done today, your results will be available in MyChart, we will contact you for abnormal readings.  Your physician recommends that you schedule a follow-up appointment in: 6 weeks.  If you have any questions or concerns before your next appointment please send [place] a message through [place] or call our office at [phone].    TO LEAVE A MESSAGE FOR THE NURSE SELECT OPTION 2, PLEASE LEAVE A MESSAGE INCLUDING: YOUR NAME DATE OF BIRTH CALL BACK NUMBER REASON FOR CALL**this is important as we prioritize the call backs  YOU WILL RECEIVE A CALL BACK THE SAME DAY AS LONG AS YOU CALL BEFORE 4:00 PM  At the [facility], you and your health needs are our priority. As part of our continuing mission to provide you with exceptional heart care, we have created designated Provider Care Teams. These Care Teams include your primary Cardiologist (physician) and Advanced Practice Providers (APPs- Physician Assistants and Nurse Practitioners) who all work together to provide you with the care you need, when you need it.   You may see any of the following providers on your designated Care Team at your next follow up: Dr Arvilla Meres Dr Marca Ancona Dr. Dorthula Nettles Dr. Clearnce Hasten Amy Filbert Schilder, NP Robbie Lis, [place] [facility] Rector, [place] Brynda Peon, NP [place] Lee, NP Karle Plumber, PharmD   Please be sure to bring in all your medications bottles to every appointment.    Thank you for choosing [facility]

## 2023-04-23 NOTE — Progress Notes (Signed)
Medication Samples have been provided to the patient.  Drug name: Eliquis        Strength: 2.5 mg        Qty: 4  LOT: [id number]  Exp.Date: 08/2024  Dosing instructions: Take 2 tablets Twice daily   The patient has been instructed regarding the correct time, dose, and frequency of taking this medication, including desired effects and most common side effects.   Joseph Greer 4:30 PM 04/23/2023

## 2023-04-24 ENCOUNTER — Other Ambulatory Visit (HOSPITAL_COMMUNITY): Payer: Self-pay | Admitting: Cardiology

## 2023-04-24 LAB — ECHOCARDIOGRAM COMPLETE
Area-P 1/2: 5.88 cm2
S' Lateral: 6.1 cm

## 2023-04-24 NOTE — Progress Notes (Signed)
Advanced Heart Failure Clinic Note   PCP: Kirstie Peri, MD Nephrology: Dr. Wolfgang Phoenix  HF Cardiology: Dr. Shirlee Latch   HPI: 67 y.o. male w/ h/o HCV s/p liver transplant in 2009 (followed at [facility]), Stage III CKD, HTN and T2DM.   He was admitted 7/21 w/ NSTEMI and Afib w/ RVR, found to have severe 3VD and biventricular heart failure, LVEF 20-25%, RV moderately reduced. Underwent CABG + MAZE + LA appendage clipping 12/19/19. Required milrinone post operatively. Post op course complicated by ATN/ renal failure. Required CVVHD but remained anuric. Tunneled HD cath placed and transitioned to iHD, MWF.  He was able to transition off HD as an outpatient.   Repeat echo done in 10/21 with EF 20-25%, moderate LV dilation, mild-moderately decreased RV systolic function, moderate MR.   Echo in 4/22 showed EF 30%, mid anterior and inferior severe hypokinesis, basal to mid anterolateral and inferolateral akinesis, mildly decreased RV systolic function.   Admitted to [facility] 05/09/22-05/14/22 with AKI and acute ischemic liver injury in setting of cocaine use. Suspected ischemic injury 2/2 hypoperfusion from coronary vasospasm. AST/ALT peaked to 4,000s and Scr up to 8.1. He required CRRT briefly (12/05-12/06). Scr improved to 2.3 (near baseline).  Echo during this admit with EF 20%, mildly reduced RV, mild AI, mild MR, mild TR. GDMT held during admit but resumed at discharge.  Post hospital follow up 12/23, he was again off all GDMT. NYHA I-II and volume stable.  Admitted 1/24 at [facility] with unintentional opiate overdose (cocaine was contaminated). He was placed on Narcan gtt, and treated for multifocal PNA and a/c CHF. Required IV Lasix for diuresis. Continued on all HF meds at d/c.  He does have anemia of chronic disease and is getting scheduled Procrit injections.   Echo was done today and reviewed, EF 25-30% with moderate LV dilation, akinesis of inferolateral/inferior/anterolateral walls, severe LAE, mild MR,  normal RV size with mildly decreased systolic function.   He returns today for followup of CHF.  No cocaine use since 1/24.  Weight is up but he feels like he is now at a healthy weight.  He is working as a Chief Executive Officer at [employer].  Main problem currently is bilateral knee pain.  He has no significant exertional dyspnea or chest pain.  No orthopnea/PND.  No lightheadedness.   Labs (9/21): LDL 66, HDL 44 Labs (10/21): K 3.9, creatinine 2.02 Labs (12/21): K 3.7, creatinine 1.58 Labs (9/21): LDL 66 Labs (1/22): K 4.1, creatinine 1.6 Labs (3/22): K 4.6, creatinine 1.56 => 2.09 Labs (4/22): LDL 66 Labs (8/22): K 5.5, creatinine 1.92 Labs (2/23): K 6.2, creatinine 2.36 Labs (6/23): K 5.2, creatinine 2.12 Labs (12/23): K 5.0, creatinine 2.15 Labs (1/24): K 3.9, creatinine 1.9 Labs (11/24): K 4.9, creatinine 1.4  Review of systems complete and found to be negative unless listed in HPI.    PMH: 1. CKD stage III: He was on HD after CABG in 7/21 but renal function recovered.  2. HCV with cirrhosis, s/p liver transplant in 2009.  3. HTN 4. Psoriasis 5. Type 2 diabetes 6. Hyperlipidemia 7. CAD: NSTEMI 7/21.  - CABG (7/21) with LIMA-LAD, RIMA-ramus, sequential SVG-PDA and PLV, SVG -D 8. Atrial fibrillation: Paroxysmal.  - S/p Maze and LA appendage clip with CABG in 7/21.  9. Chronic systolic CHF: Ischemic cardiomyopathy.  - Echo (10/21): EF 20-25%, moderate LV dilation, mild-moderately decreased RV systolic function, moderate MR.  - Echo (4/22): EF 30%, mid anterior and inferior severe hypokinesis, basal to  mid anterolateral and inferolateral akinesis, mildly decreased RV systolic function. - Echo (12/23) at [facility]: EF 20%, LV RWMA, mildly reduced RV, mild MR, mild AI, mild TR - Echo (11/24): EF 25-30% with moderate LV dilation, akinesis of inferolateral/inferior/anterolateral walls, severe LAE, mild MR, normal RV size with mildly decreased systolic function. 10. Cocaine abuse: Has  quit.  Current Outpatient Medications  Medication Sig Dispense Refill   apixaban (ELIQUIS) 5 MG TABS tablet Take 1 tablet (5 mg total) by mouth 2 (two) times daily. 60 tablet 1   Apremilast (OTEZLA) 30 MG TABS Take 1 tablet by mouth 2 (two) times daily.     dapagliflozin propanediol (FARXIGA) 10 MG TABS tablet TAKE 1 TABLET BY MOUTH ONCE DAILY BEFORE BREAKFAST 90 tablet 3   ENTRESTO 24-26 MG TAKE 1 TABLET BY MOUTH TWICE DAILY . APPOINTMENT REQUIRED FOR FUTURE REFILLS 180 tablet 0   ergocalciferol (VITAMIN D2) 1.25 MG (50000 UT) capsule Take 50,000 Units by mouth once a week.     ferrous sulfate 325 (65 FE) MG EC tablet Take 325 mg by mouth 2 (two) times daily.     FLUoxetine (PROZAC) 40 MG capsule Take 20 mg by mouth daily.     LORazepam (ATIVAN) 1 MG tablet Take 1 mg by mouth 2 (two) times daily as needed (panic attacks).      multivitamin (ONE-A-DAY MEN'S) TABS tablet Take 1 tablet by mouth daily.     sildenafil (VIAGRA) 50 MG tablet TAKE 1 TABLET BY MOUTH AS NEEDED FOR ERECTILE DYSFUNCTION 20 tablet 0   spironolactone (ALDACTONE) 25 MG tablet Take 1/2 (one-half) tablet by mouth once daily 90 tablet 0   tacrolimus (PROGRAF) 1 MG capsule Take 2 tabs by mouth every morning & 1 tab every evening     triamcinolone cream (KENALOG) 0.1 % Apply 1 application  topically 2 (two) times daily.     vitamin B-12 (CYANOCOBALAMIN) 500 MCG tablet Take 500 mcg by mouth daily.     atorvastatin (LIPITOR) 80 MG tablet Take 1 tablet (80 mg total) by mouth daily. 90 tablet 3   carvedilol (COREG) 12.5 MG tablet Take 1 tablet (12.5 mg total) by mouth 2 (two) times daily with a meal. Please schedule an appointment for further refills 180 tablet 3   No current facility-administered medications for this encounter.   Allergies  Allergen Reactions   Morphine And Codeine Nausea And Vomiting   Social History   Socioeconomic History   Marital status: Widowed    Spouse name: Not on file   Number of children: Not on  file   Years of education: Not on file   Highest education level: Not on file  Occupational History   Not on file  Tobacco Use   Smoking status: Former    Current packs/day: 0.00    Types: Cigarettes    Quit date: 09/04/2007    Years since quitting: 15.6   Smokeless tobacco: Never  Vaping Use   Vaping status: Never Used  Substance and Sexual Activity   Alcohol use: No   Drug use: No   Sexual activity: Yes    Birth control/protection: None  Other Topics Concern   Not on file  Social History Narrative   Not on file   Social Determinants of Health   Financial Resource Strain: Low Risk  (03/23/2022)   Received from [facility], [facility]   Overall Financial Resource Strain (CARDIA)    Difficulty of Paying Living Expenses: Not hard at all  Food Insecurity: No Food Insecurity (03/23/2022)   Received from [facility], [facility]   Hunger Vital Sign    Worried About Running Out of Food in the Last Year: Never true    Ran Out of Food in the Last Year: Never true  Transportation Needs: No Transportation Needs (03/23/2022)   Received from [facility], [facility]   [facility] - Transportation    Lack of Transportation (Medical): No    Lack of Transportation (Non-Medical): No  Physical Activity: Unknown (06/15/2022)   Received from [facility]   Exercise Vital Sign    Days of Exercise per Week: 7 days    Minutes of Exercise per Session: Not on file  Stress: No Stress Concern Present (03/23/2022)   Received from [facility], [facility] of Occupational Health - Occupational Stress Questionnaire    Feeling of Stress : Not at all  Social Connections: Moderately Integrated (03/23/2022)   Received from [facility], [facility]   Social Connection and Isolation Panel [NHANES]    Frequency of Communication with Friends and Family: More than three times a week    Frequency of Social Gatherings with Friends and Family: More  than three times a week    Attends Religious Services: More than 4 times per year    Active Member of [employer] or Organizations: Yes    Attends Banker Meetings: More than 4 times per year    Marital Status: Widowed  Intimate Partner Violence: Not At Risk (03/23/2022)   Received from [facility], [facility]   Humiliation, Afraid, Rape, and Kick questionnaire    Fear of Current or Ex-Partner: No    Emotionally Abused: No    Physically Abused: No    Sexually Abused: No   Family History  Problem Relation Age of Onset   Diabetes Mother    Heart disease Father    Heart disease Son    BP 130/70   Pulse 70   Wt 78.7 kg (173 lb 6.4 oz)   SpO2 99%   BMI 27.16 kg/m   Wt Readings from Last 3 Encounters:  04/23/23 78.7 kg (173 lb 6.4 oz)  07/11/22 63.9 kg (140 lb 12.8 oz)  06/29/22 68.2 kg (150 lb 6.4 oz)   PHYSICAL EXAM: General: NAD Neck: No JVD, no thyromegaly or thyroid nodule.  Lungs: Clear to auscultation bilaterally with normal respiratory effort. CV: Nondisplaced PMI.  Heart regular S1/S2, no S3/S4, no murmur.  No peripheral edema.  No carotid bruit.  Normal pedal pulses.  Abdomen: Soft, nontender, no hepatosplenomegaly, no distention.  Skin: Intact without lesions or rashes.  Neurologic: Alert and oriented x 3.  Psych: Normal affect. Extremities: No clubbing or cyanosis.  HEENT: Normal.   ASSESSMENT & PLAN: 1. CAD: NSTEMI in 7/21.  Suspect out of hospital inferolateral MI.  Cardiac MRI showed that the inferolateral wall was likely not viable, but the remainder of the LV myocardium was likely viable. He had CABG 12/19/19 with LIMA-LAD, RIMA-ramus, seq SVG-PD/PL, SVG-D.  No chest pain.   - No ASA given Eliquis use.  - Continue atorvastatin 80 mg daily.  Check lipids today.  2. Chronic Systolic CHF: Ischemic cardiomyopathy.  Echo 7/21 with EF 20-25%, moderately decreased RV systolic function. Cardiac MRI with LV EF 26%, RV EF 38%, near full thickness scar in  inferolateral wall suggesting lack of viability in the LCx territory.  Now post-CABG.  Echo in 10/21 showed EF 20-25%, moderate LV dilation, mild-moderately decreased RV systolic function, moderate MR.  Echo in 4/22 showed EF 30%, mid anterior and inferior severe hypokinesis, basal to mid anterolateral and inferolateral akinesis, mildly decreased RV systolic function.  Echo at [facility] 12/23 with EF 20%, regional WMA in LV, mildly reduced RV. Echo today showed EF 25-30% with moderate LV dilation, akinesis of inferolateral/inferior/anterolateral walls, severe LAE, mild MR, normal RV size with mildly decreased systolic function. NYHA class II symptoms, not volume overloaded on exam.  - He is not taking Lasix. - Continue Entresto 24/26 mg bid.  - Continue spironolactone 12.5 mg daily. BMET/BNP today.  - Increase Coreg to 12.5 mg bid.  - Continue Farxiga 10 mg daily. - He has decided that he does not want an ICD, reiterates this today.   - Recommended he abstain from cocaine use. 3. Atrial fibrillation:  S/p Maze + LAA clipping on 7/16.  He is off amiodarone. NSR today.  - Continue Eliquis 5 mg bid. CBC today.   4. CKD: Stage 3.  He required HD after CABG but renal function recovered. He is followed by nephrology - BMET today.  - Continue Farxiga. 5. Liver transplant for HCV: Followed at [facility]. On Tacrolimus.  6. Cocaine abuse: He has quit since 1/24.  7. Anemia of Chronic Disease:  Gets Procrit.   Follow up in 6 wks with APP for medication titration.   Marca Ancona, MD 04/24/23

## 2023-04-26 ENCOUNTER — Telehealth (HOSPITAL_COMMUNITY): Payer: Self-pay | Admitting: *Deleted

## 2023-04-26 ENCOUNTER — Telehealth: Payer: Self-pay | Admitting: *Deleted

## 2023-04-26 DIAGNOSIS — M25562 Pain in left knee: Secondary | ICD-10-CM | POA: Diagnosis not present

## 2023-04-26 NOTE — Telephone Encounter (Signed)
   Pre-operative Risk Assessment    Patient Name: Joseph Greer  DOB: 1956-05-05 MRN: [id number]  DATE OF LAST VISIT: 07/11/22 DR. McLEAN DATE OF NEXT VISIT: 06/04/23 [facility] PA/NP APPT    Request for Surgical Clearance    Procedure:   LEFT KNEE SCOPE WITH CHONDROPLASTY AND POSSIBLE MICROFRACTURE  Date of Surgery:  Clearance TBD                                 Surgeon:  DR. Duwayne Heck Surgeon's Group or Practice Name:  [facility] Phone number:  [phone] KERRI MAZE Fax number:  [phone]   Type of Clearance Requested:   - Medical  - Pharmacy:  Hold Apixaban (Eliquis)     Type of Anesthesia:   CHOICE   Additional requests/questions:    Joseph Greer   04/26/2023, 5:33 PM

## 2023-04-26 NOTE — Telephone Encounter (Signed)
Called patient per Dr. Shirlee Latch with following results and instructions:  "Creatinine better.  Low K diet and make sure no K supplement."  Pt says he is taking potassium daily so he will stop. Verbalized understanding of above with no further questions.

## 2023-04-30 NOTE — Telephone Encounter (Signed)
Patient with diagnosis of A Fib on Eliquis for anticoagulation.    Procedure: LEFT KNEE SCOPE WITH CHONDROPLASTY AND POSSIBLE MICROFRACTURE  Date of procedure: TBD   CHA2DS2-VASc Score = 5  This indicates a 7.2% annual risk of stroke. The patient's score is based upon: CHF History: 1 HTN History: 1 Diabetes History: 1 Stroke History: 0 Vascular Disease History: 1 Age Score: 1 Gender Score: 0    CrCl 42mL/min Platelet count 172K   Per office protocol, patient can hold Eliquis for 3 days prior to procedure.     **This guidance is not considered finalized until pre-operative APP has relayed final recommendations.**

## 2023-05-01 NOTE — Telephone Encounter (Signed)
   Patient Name: Joseph Greer  DOB: Sep 05, 1955 MRN: [id number]  Primary Cardiologist: Dina Rich, MD  Chart reviewed as part of pre-operative protocol coverage. Pre-op clearance already addressed by colleagues in earlier phone notes. To summarize recommendations:  - This would be ok as long as not general anesthesia  -Dr. Shirlee Latch  Per office protocol, patient can hold Eliquis for 3 days prior to procedure.  Please resume a medic safe to do so.      Will route this bundled recommendation to requesting provider via Epic fax function and remove from pre-op pool. Please call with questions.  Sharlene Dory, PA-C 05/01/2023, 7:42 AM

## 2023-05-09 DIAGNOSIS — M25521 Pain in right elbow: Secondary | ICD-10-CM | POA: Diagnosis not present

## 2023-05-14 DIAGNOSIS — N1831 Chronic kidney disease, stage 3a: Secondary | ICD-10-CM | POA: Diagnosis not present

## 2023-05-14 DIAGNOSIS — I5042 Chronic combined systolic (congestive) and diastolic (congestive) heart failure: Secondary | ICD-10-CM | POA: Diagnosis not present

## 2023-05-14 DIAGNOSIS — N2581 Secondary hyperparathyroidism of renal origin: Secondary | ICD-10-CM | POA: Diagnosis not present

## 2023-05-14 DIAGNOSIS — R809 Proteinuria, unspecified: Secondary | ICD-10-CM | POA: Diagnosis not present

## 2023-05-22 NOTE — Pre-Procedure Instructions (Addendum)
Surgical Instructions   Your procedure is scheduled on May 25, 2023. Report to [facility] Main Entrance "A" at 1:40 P.M., then check in with the Admitting office. Any questions or running late day of surgery: call [phone]  Questions prior to your surgery date: call [phone], Monday-Friday, 8am-4pm. If you experience any cold or flu symptoms such as cough, fever, chills, shortness of breath, etc. between now and your scheduled surgery, please notify us at the above number.     Remember:  Do not eat after midnight the night before your surgery   You may drink clear liquids until 12:40 PM the afternoon of your surgery.   Clear liquids allowed are: Water, Non-Citrus Juices (without pulp), Carbonated Beverages, Clear Tea (no milk, honey, etc.), Black Coffee Only (NO MILK, CREAM OR POWDERED CREAMER of any kind), and Gatorade.  Patient Instructions  The night before surgery:  No food after midnight. ONLY clear liquids after midnight  The day of surgery (if you do NOT have diabetes):  Drink ONE (1) Pre-Surgery Clear Ensure by 12:40 PM the afternoon of surgery. Drink in one sitting. Do not sip.  This drink was given to you during your hospital  pre-op appointment visit.  Nothing else to drink after completing the  Pre-Surgery Clear Ensure.         If you have questions, please contact your surgeon's office.     Take these medicines the morning of surgery with A SIP OF WATER: Apremilast (OTEZLA)  carvedilol (COREG)  FLUoxetine (PROZAC)  tacrolimus (PROGRAF)  traMADol Janean Sark)    May take these medicines IF NEEDED: LORazepam (ATIVAN)    Follow your surgeon's instructions on when to stop apixaban (ELIQUIS).  If no instructions were given by your surgeon then you will need to call the office to get those instructions.     STOP taking your dapagliflozin propanediol (FARXIGA) three days prior to surgery. Your last dose will be December 16th.   One week prior to  surgery, STOP taking any Aspirin (unless otherwise instructed by your surgeon) Aleve, Naproxen, Ibuprofen, Motrin, Advil, Goody's, BC's, all herbal medications, fish oil, and non-prescription vitamins.                     Do NOT Smoke (Tobacco/Vaping) for 24 hours prior to your procedure.  If you use a CPAP at night, you may bring your mask/headgear for your overnight stay.   You will be asked to remove any contacts, glasses, piercing's, hearing aid's, dentures/partials prior to surgery. Please bring cases for these items if needed.    Patients discharged the day of surgery will not be allowed to drive home, and someone needs to stay with them for 24 hours.  SURGICAL WAITING ROOM VISITATION Patients may have no more than 2 support people in the waiting area - these visitors may rotate.   Pre-op nurse will coordinate an appropriate time for 1 ADULT support person, who may not rotate, to accompany patient in pre-op.  Children under the age of 9 must have an adult with them who is not the patient and must remain in the main waiting area with an adult.  If the patient needs to stay at the hospital during part of their recovery, the visitor guidelines for inpatient rooms apply.  Please refer to the [facility] website for the visitor guidelines for any additional information.   If you received a COVID test during your pre-op visit  it is requested that you wear a  mask when out in public, stay away from anyone that may not be feeling well and notify your surgeon if you develop symptoms. If you have been in contact with anyone that has tested positive in the last 10 days please notify you surgeon.      Pre-operative CHG Bathing Instructions   You can play a key role in reducing the risk of infection after surgery. Your skin needs to be as free of germs as possible. You can reduce the number of germs on your skin by washing with CHG (chlorhexidine gluconate) soap before surgery. CHG is an  antiseptic soap that kills germs and continues to kill germs even after washing.   DO NOT use if you have an allergy to chlorhexidine/CHG or antibacterial soaps. If your skin becomes reddened or irritated, stop using the CHG and notify one of our RNs at [phone].              TAKE A SHOWER THE NIGHT BEFORE SURGERY AND THE DAY OF SURGERY    Please keep in mind the following:  DO NOT shave, including legs and underarms, 48 hours prior to surgery.   You may shave your face before/day of surgery.  Place clean sheets on your bed the night before surgery Use a clean washcloth (not used since being washed) for each shower. DO NOT sleep with pet's night before surgery.  CHG Shower Instructions:  Wash your face and private area with normal soap. If you choose to wash your hair, wash first with your normal shampoo.  After you use shampoo/soap, rinse your hair and body thoroughly to remove shampoo/soap residue.  Turn the water OFF and apply half the bottle of CHG soap to a CLEAN washcloth.  Apply CHG soap ONLY FROM YOUR NECK DOWN TO YOUR TOES (washing for 3-5 minutes)  DO NOT use CHG soap on face, private areas, open wounds, or sores.  Pay special attention to the area where your surgery is being performed.  If you are having back surgery, having someone wash your back for you may be helpful. Wait 2 minutes after CHG soap is applied, then you may rinse off the CHG soap.  Pat dry with a clean towel  Put on clean pajamas    Additional instructions for the day of surgery: DO NOT APPLY any lotions, deodorants, cologne, or perfumes.   Do not wear jewelry or makeup Do not wear nail polish, gel polish, artificial nails, or any other type of covering on natural nails (fingers and toes) Do not bring valuables to the hospital. [facility] is not responsible for valuables/personal belongings. Put on clean/comfortable clothes.  Please brush your teeth.  Ask your nurse before applying any prescription  medications to the skin.

## 2023-05-23 ENCOUNTER — Other Ambulatory Visit: Payer: Self-pay

## 2023-05-23 ENCOUNTER — Encounter (HOSPITAL_COMMUNITY): Payer: Self-pay

## 2023-05-23 ENCOUNTER — Encounter (HOSPITAL_COMMUNITY)
Admission: RE | Admit: 2023-05-23 | Discharge: 2023-05-23 | Disposition: A | Discharge status: Home / Self Care | Payer: Medicare HMO | Source: Ambulatory Visit | Attending: Orthopedic Surgery | Admitting: Orthopedic Surgery

## 2023-05-23 VITALS — BP 111/61 | HR 62 | Temp 97.8°F | Resp 17 | Ht 72.0 in | Wt 167.0 lb

## 2023-05-23 DIAGNOSIS — Z01812 Encounter for preprocedural laboratory examination: Secondary | ICD-10-CM | POA: Diagnosis not present

## 2023-05-23 DIAGNOSIS — Z7901 Long term (current) use of anticoagulants: Secondary | ICD-10-CM | POA: Insufficient documentation

## 2023-05-23 DIAGNOSIS — B182 Chronic viral hepatitis C: Secondary | ICD-10-CM | POA: Insufficient documentation

## 2023-05-23 DIAGNOSIS — Z79899 Other long term (current) drug therapy: Secondary | ICD-10-CM | POA: Diagnosis not present

## 2023-05-23 DIAGNOSIS — Z951 Presence of aortocoronary bypass graft: Secondary | ICD-10-CM | POA: Insufficient documentation

## 2023-05-23 DIAGNOSIS — N183 Chronic kidney disease, stage 3 unspecified: Secondary | ICD-10-CM | POA: Insufficient documentation

## 2023-05-23 DIAGNOSIS — I429 Cardiomyopathy, unspecified: Secondary | ICD-10-CM | POA: Diagnosis not present

## 2023-05-23 DIAGNOSIS — I4891 Unspecified atrial fibrillation: Secondary | ICD-10-CM | POA: Insufficient documentation

## 2023-05-23 DIAGNOSIS — D631 Anemia in chronic kidney disease: Secondary | ICD-10-CM | POA: Insufficient documentation

## 2023-05-23 DIAGNOSIS — Z944 Liver transplant status: Secondary | ICD-10-CM | POA: Insufficient documentation

## 2023-05-23 DIAGNOSIS — Z01818 Encounter for other preprocedural examination: Secondary | ICD-10-CM | POA: Diagnosis not present

## 2023-05-23 HISTORY — DX: Malignant (primary) neoplasm, unspecified: C80.1

## 2023-05-23 LAB — CBC
HCT: 38.7 % — ABNORMAL LOW (ref 39.0–52.0)
Hemoglobin: 12.6 g/dL — ABNORMAL LOW (ref 13.0–17.0)
MCH: 29 pg (ref 26.0–34.0)
MCHC: 32.6 g/dL (ref 30.0–36.0)
MCV: 89.2 fL (ref 80.0–100.0)
Platelets: 253 10*3/uL (ref 150–400)
RBC: 4.34 MIL/uL (ref 4.22–5.81)
RDW: 14 % (ref 11.5–15.5)
WBC: 7.3 10*3/uL (ref 4.0–10.5)
nRBC: 0 % (ref 0.0–0.2)

## 2023-05-23 LAB — COMPREHENSIVE METABOLIC PANEL
ALT: 12 U/L (ref 0–44)
AST: 23 U/L (ref 15–41)
Albumin: 3.3 g/dL — ABNORMAL LOW (ref 3.5–5.0)
Alkaline Phosphatase: 111 U/L (ref 38–126)
Anion gap: 11 (ref 5–15)
BUN: 16 mg/dL (ref 8–23)
CO2: 24 mmol/L (ref 22–32)
Calcium: 8.8 mg/dL — ABNORMAL LOW (ref 8.9–10.3)
Chloride: 102 mmol/L (ref 98–111)
Creatinine, Ser: 1.53 mg/dL — ABNORMAL HIGH (ref 0.61–1.24)
GFR, Estimated: 50 mL/min — ABNORMAL LOW (ref >60)
Glucose, Bld: 128 mg/dL — ABNORMAL HIGH (ref 70–99)
Potassium: 4.7 mmol/L (ref 3.5–5.1)
Sodium: 137 mmol/L (ref 135–145)
Total Bilirubin: 0.6 mg/dL (ref <1.2)
Total Protein: 7.3 g/dL (ref 6.5–8.1)

## 2023-05-23 NOTE — Progress Notes (Signed)
PCP - Dr. Kirstie Peri Cardiologist - Dr. Marca Ancona Nephrologist- Dr. Celso Amy  PPM/ICD - denies   Chest x-ray - 06/14/22 EKG - 04/13/23 Stress Test - 15 years ago per pt, prior to liver transplant ECHO - 04/23/23 Cardiac Cath - 12/12/19- Left; 12/17/19-Right  Sleep Study - denies   DM- denies  Pt states he was never told to stop his [place]. He took his last dose on 12/17 around 2300. Fayrene Fearing w/ anesthesia notified.   Last dose of GLP1 agonist-  n/a   Blood Thinner Instructions: Hold Eliquis 4 days, Last dose 12/15 Aspirin Instructions: n/a  ERAS Protcol - yes PRE-SURGERY Ensure given at PAT  COVID TEST- n/a   Anesthesia review: yes, cardiac hx. Per Dr. Shirlee Latch, Okay to proceed as long as no general anesthesia  Patient denies shortness of breath, fever, cough and chest pain at PAT appointment   All instructions explained to the patient, with a verbal understanding of the material. Patient agrees to go over the instructions while at home for a better understanding.  The opportunity to ask questions was provided.

## 2023-05-24 DIAGNOSIS — D849 Immunodeficiency, unspecified: Secondary | ICD-10-CM | POA: Diagnosis not present

## 2023-05-24 DIAGNOSIS — B998 Other infectious disease: Secondary | ICD-10-CM | POA: Diagnosis not present

## 2023-05-24 NOTE — Progress Notes (Signed)
Anesthesia Chart Review:  History of orthotopic liver transplant in 2009 for HCV at [facility] He completed antiviral therapy with Harvoni/RBV 12/2013 and had SVR and undetectable virus over 1 year post treatment. In July 2021 he underwent MAZE,closure of PFO and CABG x5. Had post op AKI required HD temporarily. Had rise in liver enzymes and fluctuating prograf levels during that time. He was admitted 05/2022 after initially presenting to [facility] with abdominal pain and found to have acute renal failure and acute liver injury requiring transfer to [facility] MICU for initiatoin of CRRT. Pro-BNP >7000. CT A/P showed volume overload with bilateral pleural effusions, cardiomegaly as well as calcified atherosclerotic changes. No ascites present. CXR obtained demonstrated cardiomegaly and diffuse interstitial pattern compatible with congestive heart failure. Troponin 717. AST 3976, ALT 3164, INR 3.7. He was ultimately felt to have ischemic injury iso recent cocaine use. Infectious workup was negative. EBV though was 1,276. ID consulted and felt that positivity could be reactivation following an illness and should resolve. LFTs trended down. Cr was 2.3 at time of discharge. Peaked at 8.1. He re-presented to local ED on 06/14/22 with altered mental status. Found unresponsive by son. Pupils were fixed and dilated. He was given narcan and pt woke up and began vomiting. He admitted to cocaine use again, likely laced with narcotic. He denies any further drug use.   Last seen in the advanced heart failure clinic by Dr. Shirlee Latch on 04/23/2023.  Echo done at that time showed EF 25 to 30% with moderate LV dilation, akinesis of inferolateral/inferior/anterolateral walls, severe LAE, mild MR, normal RV size with mildly decreased systolic function.  He was continued on Entresto, spironolactone, Farxiga, Coreg (increased to 12.5 mg twice daily).  Patient has declined ICD.  He continues on Eliquis for history of atrial fibrillation.   Dr. Shirlee Latch commented on upcoming surgery and telephone encounter 04/30/2023 stating, "this would be okay as long as not general anesthesia.".  Last seen in transplant clinic at [facility] on 02/01/2023.  Per note, "Joseph Greer is overall doing well s/p OLT in 2009 for HCV, s/p SVR to antiviral therapy. He has cardiomyopathy. Denies further cocaine use. More skin cancers-will try to lower tac now 15y post LT to 1/1, has had cost issues and getting from [place]."  Stage III CKD followed by nephrologist Dr. Wolfgang Phoenix.  Anemia of chronic disease, gets Procrit.  Patient reports last dose Eliquis 05/20/2023.  History of difficult airway, per intubation note from CABG 12/19/2019, "Comments: First attempt Grade 4 view with Mac 4, narrow pallet and anterior larynx, converted to Glidescope with Grade 1 view."  Preop labs reviewed, creatinine elevated at 1.53 consistent with history of CKD, mild anemia with globin 12.6, otherwise unremarkable.  EKG 04/23/2023: Sinus rhythm with Premature supraventricular complexes.  Rate 74. Non-specific intra-ventricular conduction block. Cannot rule out Septal infarct (cited on or before 23-Apr-2023). T wave abnormality, consider lateral ischemia  TTE 04/23/23:  1. Left ventricular ejection fraction, by estimation, is 25 to 30%. The  left ventricle has severely decreased function. The left ventricle  demonstrates global hypokinesis. The left ventricular internal cavity size  was severely dilated. There is moderate   asymmetric left ventricular hypertrophy of the basal-septal segment. Left  ventricular diastolic parameters are indeterminate.   2. Right ventricular systolic function is mildly reduced. The right  ventricular size is normal. Tricuspid regurgitation signal is inadequate  for assessing PA pressure.   3. Left atrial size was severely dilated.   4. Right atrial  size was mildly dilated.   5. The mitral valve is normal in structure. Mild mitral valve   regurgitation. No evidence of mitral stenosis.   6. The aortic valve is tricuspid. Aortic valve regurgitation is mild. No  aortic stenosis is present.      Joseph Greer [facility] Short Stay Center/Anesthesiology Phone [phone] 05/24/2023 9:25 AM

## 2023-05-24 NOTE — Anesthesia Preprocedure Evaluation (Addendum)
Anesthesia Evaluation  Patient identified by MRN, date of birth, ID band Patient awake    Reviewed: Allergy & Precautions, NPO status , Patient's Chart, lab work & pertinent test results  Airway Mallampati: III  TM Distance: >3 FB Neck ROM: Full    Dental  (+) Teeth Intact, Dental Advisory Given   Pulmonary former smoker   breath sounds clear to auscultation       Cardiovascular hypertension, Pt. on home beta blockers + CAD, + Past MI, + CABG and +CHF   Rhythm:Regular Rate:Normal  Echo: 1. Left ventricular ejection fraction, by estimation, is 25 to 30%. The  left ventricle has severely decreased function. The left ventricle  demonstrates global hypokinesis. The left ventricular internal cavity size  was severely dilated. There is moderate   asymmetric left ventricular hypertrophy of the basal-septal segment. Left  ventricular diastolic parameters are indeterminate.   2. Right ventricular systolic function is mildly reduced. The right  ventricular size is normal. Tricuspid regurgitation signal is inadequate  for assessing PA pressure.   3. Left atrial size was severely dilated.   4. Right atrial size was mildly dilated.   5. The mitral valve is normal in structure. Mild mitral valve  regurgitation. No evidence of mitral stenosis.   6. The aortic valve is tricuspid. Aortic valve regurgitation is mild. No  aortic stenosis is present.     Neuro/Psych    GI/Hepatic ,,,(+) Hepatitis -, C  Endo/Other  diabetes    Renal/GU Renal disease     Musculoskeletal   Abdominal   Peds  Hematology   Anesthesia Other Findings   Reproductive/Obstetrics                             Anesthesia Physical Anesthesia Plan  ASA: 4  Anesthesia Plan: General   Post-op Pain Management: Tylenol PO (pre-op)* and Toradol IV (intra-op)*   Induction: Intravenous  PONV Risk Score and Plan: 3 and Ondansetron,  Dexamethasone and Midazolam  Airway Management Planned: LMA  Additional Equipment: None  Intra-op Plan:   Post-operative Plan: Extubation in OR  Informed Consent: I have reviewed the patients History and Physical, chart, labs and discussed the procedure including the risks, benefits and alternatives for the proposed anesthesia with the patient or authorized representative who has indicated his/her understanding and acceptance.     Dental advisory given  Plan Discussed with: CRNA  Anesthesia Plan Comments: (PAT note by Antionette Poles, PA-C:  History of orthotopic liver transplant in 2009 for HCV at [facility]. He completed antiviral therapy with Harvoni/RBV 12/2013 and had SVR and undetectable virus over 1 year post treatment. In July 2021 he underwent MAZE,closure of PFO and CABG x5. Had post op AKI required HD temporarily. Had rise in liver enzymes and fluctuating prograf levels during that time. He was admitted 05/2022 after initially presenting to [facility] with abdominal pain and found to have acute renal failure and acute liver injury requiring transfer to [facility] MICU for initiatoin of CRRT. Pro-BNP >7000. CT A/P showed volume overload with bilateral pleural effusions, cardiomegaly as well as calcified atherosclerotic changes. No ascites present. CXR obtained demonstrated cardiomegaly and diffuse interstitial pattern compatible with congestive heart failure. Troponin 717. AST 3976, ALT 3164, INR 3.7. He was ultimately felt to have ischemic injury iso recent cocaine use. Infectious workup was negative. EBV though was 1,276. ID consulted and felt that positivity could be reactivation following an illness and should resolve. LFTs  trended down. Cr was 2.3 at time of discharge. Peaked at 8.1. He re-presented to local ED on 06/14/22 with altered mental status. Found unresponsive by son. Pupils were fixed and dilated. He was given narcan and pt woke up and began vomiting. He admitted to cocaine use  again, likely laced with narcotic. He denies any further drug use.   Last seen in the advanced heart failure clinic by Dr. Shirlee Latch on 04/23/2023.  Echo done at that time showed EF 25 to 30% with moderate LV dilation, akinesis of inferolateral/inferior/anterolateral walls, severe LAE, mild MR, normal RV size with mildly decreased systolic function.  He was continued on Entresto, spironolactone, Farxiga, Coreg (increased to 12.5 mg twice daily).  Patient has declined ICD.  He continues on Eliquis for history of atrial fibrillation.  Dr. Shirlee Latch commented on upcoming surgery and telephone encounter 04/30/2023 stating, "this would be okay as long as not general anesthesia.".  Last seen in transplant clinic at [facility] on 02/01/2023.  Per note, "Mr. Rosenfield is overall doing well s/p OLT in 2009 for HCV, s/p SVR to antiviral therapy. He has cardiomyopathy. Denies further cocaine use. More skin cancers-will try to lower tac now 15y post LT to 1/1, has had cost issues and getting from [place]."  Stage III CKD followed by nephrologist Dr. Wolfgang Phoenix.  Anemia of chronic disease, gets Procrit.  Patient reports last dose Eliquis 05/20/2023.  History of difficult airway, per intubation note from CABG 12/19/2019, "Comments: First attempt Grade 4 view with Mac 4, narrow pallet and anterior larynx, converted to Glidescope with Grade 1 view."  Preop labs reviewed, creatinine elevated at 1.53 consistent with history of CKD, mild anemia with globin 12.6, otherwise unremarkable.  EKG 04/23/2023: Sinus rhythm with Premature supraventricular complexes.  Rate 74. Non-specific intra-ventricular conduction block. Cannot rule out Septal infarct (cited on or before 23-Apr-2023). T wave abnormality, consider lateral ischemia  TTE 04/23/23: 1. Left ventricular ejection fraction, by estimation, is 25 to 30%. The  left ventricle has severely decreased function. The left ventricle  demonstrates global hypokinesis. The left  ventricular internal cavity size  was severely dilated. There is moderate  asymmetric left ventricular hypertrophy of the basal-septal segment. Left  ventricular diastolic parameters are indeterminate.  2. Right ventricular systolic function is mildly reduced. The right  ventricular size is normal. Tricuspid regurgitation signal is inadequate  for assessing PA pressure.  3. Left atrial size was severely dilated.  4. Right atrial size was mildly dilated.  5. The mitral valve is normal in structure. Mild mitral valve  regurgitation. No evidence of mitral stenosis.  6. The aortic valve is tricuspid. Aortic valve regurgitation is mild. No  aortic stenosis is present.    )        Anesthesia Quick Evaluation

## 2023-05-25 ENCOUNTER — Encounter (HOSPITAL_COMMUNITY): Payer: Self-pay | Admitting: Orthopedic Surgery

## 2023-05-25 ENCOUNTER — Ambulatory Visit (HOSPITAL_COMMUNITY): Payer: Medicare HMO | Admitting: Certified Registered"

## 2023-05-25 ENCOUNTER — Encounter (HOSPITAL_COMMUNITY)
Admission: RE | Disposition: A | Discharge status: Home / Self Care | Payer: Self-pay | Source: Ambulatory Visit | Attending: Orthopedic Surgery

## 2023-05-25 ENCOUNTER — Ambulatory Visit (HOSPITAL_COMMUNITY)
Admission: RE | Admit: 2023-05-25 | Discharge: 2023-05-25 | Disposition: A | Discharge status: Home / Self Care | Payer: Medicare HMO | Source: Ambulatory Visit | Attending: Orthopedic Surgery | Admitting: Orthopedic Surgery

## 2023-05-25 ENCOUNTER — Other Ambulatory Visit: Payer: Self-pay

## 2023-05-25 ENCOUNTER — Ambulatory Visit (HOSPITAL_COMMUNITY): Payer: Medicare HMO | Admitting: Physician Assistant

## 2023-05-25 ENCOUNTER — Other Ambulatory Visit (HOSPITAL_COMMUNITY): Payer: Self-pay

## 2023-05-25 DIAGNOSIS — X58XXXA Exposure to other specified factors, initial encounter: Secondary | ICD-10-CM | POA: Diagnosis not present

## 2023-05-25 DIAGNOSIS — M6598 Unspecified synovitis and tenosynovitis, other site: Secondary | ICD-10-CM | POA: Diagnosis present

## 2023-05-25 DIAGNOSIS — Z7984 Long term (current) use of oral hypoglycemic drugs: Secondary | ICD-10-CM | POA: Diagnosis not present

## 2023-05-25 DIAGNOSIS — Z87891 Personal history of nicotine dependence: Secondary | ICD-10-CM | POA: Diagnosis not present

## 2023-05-25 DIAGNOSIS — E1122 Type 2 diabetes mellitus with diabetic chronic kidney disease: Secondary | ICD-10-CM | POA: Insufficient documentation

## 2023-05-25 DIAGNOSIS — I130 Hypertensive heart and chronic kidney disease with heart failure and stage 1 through stage 4 chronic kidney disease, or unspecified chronic kidney disease: Secondary | ICD-10-CM | POA: Diagnosis not present

## 2023-05-25 DIAGNOSIS — I509 Heart failure, unspecified: Secondary | ICD-10-CM

## 2023-05-25 DIAGNOSIS — S83242A Other tear of medial meniscus, current injury, left knee, initial encounter: Secondary | ICD-10-CM | POA: Diagnosis not present

## 2023-05-25 DIAGNOSIS — I110 Hypertensive heart disease with heart failure: Secondary | ICD-10-CM | POA: Diagnosis not present

## 2023-05-25 DIAGNOSIS — N183 Chronic kidney disease, stage 3 unspecified: Secondary | ICD-10-CM | POA: Diagnosis not present

## 2023-05-25 DIAGNOSIS — Z951 Presence of aortocoronary bypass graft: Secondary | ICD-10-CM | POA: Insufficient documentation

## 2023-05-25 DIAGNOSIS — Z7901 Long term (current) use of anticoagulants: Secondary | ICD-10-CM | POA: Diagnosis not present

## 2023-05-25 DIAGNOSIS — I2510 Atherosclerotic heart disease of native coronary artery without angina pectoris: Secondary | ICD-10-CM | POA: Insufficient documentation

## 2023-05-25 DIAGNOSIS — Z944 Liver transplant status: Secondary | ICD-10-CM | POA: Diagnosis not present

## 2023-05-25 DIAGNOSIS — M2242 Chondromalacia patellae, left knee: Secondary | ICD-10-CM

## 2023-05-25 HISTORY — PX: CHONDROPLASTY: SHX5177

## 2023-05-25 HISTORY — PX: KNEE ARTHROSCOPY: SHX127

## 2023-05-25 SURGERY — ARTHROSCOPY, KNEE
Anesthesia: General | Site: Knee | Laterality: Left

## 2023-05-25 MED ORDER — PHENYLEPHRINE 80 MCG/ML (10ML) SYRINGE FOR IV PUSH (FOR BLOOD PRESSURE SUPPORT)
PREFILLED_SYRINGE | INTRAVENOUS | Status: DC | PRN
  Administered 2023-05-25: 15 ug via INTRAVENOUS
  Administered 2023-05-25: 25 ug via INTRAVENOUS

## 2023-05-25 MED ORDER — ACETAMINOPHEN 10 MG/ML IV SOLN
1000.0000 mg | Freq: Once | INTRAVENOUS | Status: DC | PRN
Start: 2023-05-25 — End: 2023-05-25

## 2023-05-25 MED ORDER — CEFAZOLIN SODIUM-DEXTROSE 2-4 GM/100ML-% IV SOLN
INTRAVENOUS | Status: AC
  Filled 2023-05-25: qty 100

## 2023-05-25 MED ORDER — LACTATED RINGERS IV SOLN: INTRAVENOUS | Status: DC

## 2023-05-25 MED ORDER — ONDANSETRON HCL 4 MG/2ML IJ SOLN
INTRAMUSCULAR | Status: DC | PRN
  Administered 2023-05-25: 4 mg via INTRAVENOUS

## 2023-05-25 MED ORDER — BUPIVACAINE HCL 0.25 % IJ SOLN
INTRAMUSCULAR | Status: DC | PRN
  Administered 2023-05-25: 15 mL

## 2023-05-25 MED ORDER — NOREPINEPHRINE 4 MG/250ML-% IV SOLN
INTRAVENOUS | Status: AC
  Filled 2023-05-25: qty 250

## 2023-05-25 MED ORDER — DEXAMETHASONE SODIUM PHOSPHATE 10 MG/ML IJ SOLN
INTRAMUSCULAR | Status: DC | PRN
  Administered 2023-05-25: 4 mg via INTRAVENOUS

## 2023-05-25 MED ORDER — PROPOFOL 10 MG/ML IV BOLUS
INTRAVENOUS | Status: DC | PRN
  Administered 2023-05-25 (×2): 50 mg via INTRAVENOUS
  Administered 2023-05-25: 100 mg via INTRAVENOUS

## 2023-05-25 MED ORDER — DROPERIDOL 2.5 MG/ML IJ SOLN: 0.6250 mg | Freq: Once | INTRAMUSCULAR | Status: DC | PRN

## 2023-05-25 MED ORDER — CHLORHEXIDINE GLUCONATE 0.12 % MT SOLN
OROMUCOSAL | Status: AC
  Administered 2023-05-25: 15 mL via OROMUCOSAL
  Filled 2023-05-25: qty 15

## 2023-05-25 MED ORDER — FENTANYL CITRATE (PF) 250 MCG/5ML IJ SOLN
INTRAMUSCULAR | Status: DC | PRN
  Administered 2023-05-25 (×2): 50 ug via INTRAVENOUS
  Administered 2023-05-25: 25 ug via INTRAVENOUS
  Administered 2023-05-25: 50 ug via INTRAVENOUS

## 2023-05-25 MED ORDER — ONDANSETRON HCL 4 MG/2ML IJ SOLN
INTRAMUSCULAR | Status: AC
  Filled 2023-05-25: qty 2

## 2023-05-25 MED ORDER — PHENYLEPHRINE 80 MCG/ML (10ML) SYRINGE FOR IV PUSH (FOR BLOOD PRESSURE SUPPORT): PREFILLED_SYRINGE | INTRAVENOUS | Status: DC | PRN

## 2023-05-25 MED ORDER — OXYCODONE HCL 5 MG PO TABS
5.0000 mg | ORAL_TABLET | Freq: Once | ORAL | Status: AC | PRN
  Administered 2023-05-25: 5 mg via ORAL

## 2023-05-25 MED ORDER — ORAL CARE MOUTH RINSE: 15.0000 mL | Freq: Once | OROMUCOSAL | Status: AC

## 2023-05-25 MED ORDER — OXYCODONE HCL 5 MG PO TABS
ORAL_TABLET | ORAL | Status: AC
  Filled 2023-05-25: qty 1

## 2023-05-25 MED ORDER — CEFAZOLIN SODIUM-DEXTROSE 2-4 GM/100ML-% IV SOLN
2.0000 g | INTRAVENOUS | Status: AC
  Administered 2023-05-25: 2 g via INTRAVENOUS

## 2023-05-25 MED ORDER — SODIUM CHLORIDE 0.9 % IR SOLN
Status: DC | PRN
  Administered 2023-05-25: 3000 mL

## 2023-05-25 MED ORDER — FENTANYL CITRATE (PF) 100 MCG/2ML IJ SOLN
25.0000 ug | INTRAMUSCULAR | Status: DC | PRN
  Administered 2023-05-25: 50 ug via INTRAVENOUS

## 2023-05-25 MED ORDER — PROPOFOL 10 MG/ML IV BOLUS
INTRAVENOUS | Status: AC
  Filled 2023-05-25: qty 20

## 2023-05-25 MED ORDER — PHENYLEPHRINE HCL-NACL 20-0.9 MG/250ML-% IV SOLN
INTRAVENOUS | Status: AC
  Filled 2023-05-25: qty 250

## 2023-05-25 MED ORDER — OXYCODONE HCL 5 MG/5ML PO SOLN: 5.0000 mg | Freq: Once | ORAL | Status: AC | PRN

## 2023-05-25 MED ORDER — ONDANSETRON 4 MG PO TBDP
4.0000 mg | ORAL_TABLET | Freq: Three times a day (TID) | ORAL | 0 refills | Status: AC | PRN
  Filled 2023-05-25: qty 20, 7d supply, fill #0

## 2023-05-25 MED ORDER — 0.9 % SODIUM CHLORIDE (POUR BTL) OPTIME
TOPICAL | Status: DC | PRN
  Administered 2023-05-25: 1000 mL

## 2023-05-25 MED ORDER — ACETAMINOPHEN 160 MG/5ML PO SOLN: 325.0000 mg | ORAL | Status: DC | PRN

## 2023-05-25 MED ORDER — CHLORHEXIDINE GLUCONATE 0.12 % MT SOLN: 15.0000 mL | Freq: Once | OROMUCOSAL | Status: AC

## 2023-05-25 MED ORDER — FENTANYL CITRATE (PF) 100 MCG/2ML IJ SOLN
INTRAMUSCULAR | Status: AC
  Administered 2023-05-25: 50 ug via INTRAVENOUS
  Filled 2023-05-25: qty 2

## 2023-05-25 MED ORDER — EPHEDRINE SULFATE-NACL 50-0.9 MG/10ML-% IV SOSY
PREFILLED_SYRINGE | INTRAVENOUS | Status: DC | PRN
  Administered 2023-05-25: 5 mg via INTRAVENOUS

## 2023-05-25 MED ORDER — FENTANYL CITRATE (PF) 250 MCG/5ML IJ SOLN
INTRAMUSCULAR | Status: AC
  Filled 2023-05-25: qty 5

## 2023-05-25 MED ORDER — ACETAMINOPHEN 325 MG PO TABS: 325.0000 mg | ORAL_TABLET | ORAL | Status: DC | PRN

## 2023-05-25 MED ORDER — LIDOCAINE 2% (20 MG/ML) 5 ML SYRINGE
INTRAMUSCULAR | Status: DC | PRN
  Administered 2023-05-25: 60 mg via INTRAVENOUS

## 2023-05-25 MED ORDER — BUPIVACAINE HCL (PF) 0.25 % IJ SOLN
INTRAMUSCULAR | Status: AC
  Filled 2023-05-25: qty 30

## 2023-05-25 MED ORDER — MIDAZOLAM HCL 2 MG/2ML IJ SOLN
INTRAMUSCULAR | Status: AC
Start: 2023-05-25 — End: ?
  Filled 2023-05-25: qty 2

## 2023-05-25 MED ORDER — HYDROCODONE-ACETAMINOPHEN 5-325 MG PO TABS
1.0000 | ORAL_TABLET | Freq: Four times a day (QID) | ORAL | 0 refills | Status: DC | PRN
  Filled 2023-05-25: qty 30, 8d supply, fill #0

## 2023-05-25 SURGICAL SUPPLY — 29 items
ALCOHOL 70% 16 OZ (MISCELLANEOUS) ×1 IMPLANT
BAG COUNTER SPONGE SURGICOUNT (BAG) ×1 IMPLANT
BLADE CUTTER GATOR 3.5 (BLADE) ×1 IMPLANT
BLADE SURG 10 STRL SS (BLADE) IMPLANT
BNDG ELASTIC 6INX 5YD STR LF (GAUZE/BANDAGES/DRESSINGS) IMPLANT
BNDG ELASTIC 6X5.8 VLCR STR LF (GAUZE/BANDAGES/DRESSINGS) ×1 IMPLANT
COVER SURGICAL LIGHT HANDLE (MISCELLANEOUS) ×1 IMPLANT
DRAPE U-SHAPE 47X51 STRL (DRAPES) ×1 IMPLANT
DURAPREP 26ML APPLICATOR (WOUND CARE) ×1 IMPLANT
GAUZE 4X4 16PLY ~~LOC~~+RFID DBL (SPONGE) ×1 IMPLANT
GAUZE PAD ABD 8X10 STRL (GAUZE/BANDAGES/DRESSINGS) ×1 IMPLANT
GAUZE SPONGE 4X4 12PLY STRL (GAUZE/BANDAGES/DRESSINGS) IMPLANT
GAUZE SPONGE 4X4 12PLY STRL LF (GAUZE/BANDAGES/DRESSINGS) ×1 IMPLANT
GAUZE XEROFORM 1X8 LF (GAUZE/BANDAGES/DRESSINGS) ×1 IMPLANT
GLOVE BIO SURGEON STRL SZ7.5 (GLOVE) ×2 IMPLANT
GLOVE BIOGEL PI IND STRL 8 (GLOVE) ×2 IMPLANT
GOWN STRL REUS W/ TWL LRG LVL3 (GOWN DISPOSABLE) ×1 IMPLANT
GOWN STRL REUS W/ TWL XL LVL3 (GOWN DISPOSABLE) ×2 IMPLANT
KIT BASIN OR (CUSTOM PROCEDURE TRAY) ×1 IMPLANT
MANIFOLD NEPTUNE II (INSTRUMENTS) ×1 IMPLANT
PACK ARTHROSCOPY DSU (CUSTOM PROCEDURE TRAY) ×1 IMPLANT
PADDING CAST COTTON 6X4 STRL (CAST SUPPLIES) ×1 IMPLANT
RESECTOR TORPEDO 4MM 13CM CVD (MISCELLANEOUS) IMPLANT
STRIP CLOSURE SKIN 1/2X4 (GAUZE/BANDAGES/DRESSINGS) IMPLANT
SUT ETHILON 4 0 PS 2 18 (SUTURE) ×1 IMPLANT
SYR 30ML LL (SYRINGE) ×1 IMPLANT
TOWEL GREEN STERILE (TOWEL DISPOSABLE) ×1 IMPLANT
TUBING ARTHROSCOPY IRRIG 16FT (MISCELLANEOUS) ×1 IMPLANT
WRAP KNEE MAXI GEL POST OP (GAUZE/BANDAGES/DRESSINGS) ×1 IMPLANT

## 2023-05-25 NOTE — Op Note (Signed)
Surgery Date: 05/25/2023  Surgeon(s): Yolonda Kida, MD  Assistant:  Dion Saucier, PA-C  Assistant attestation:  PA McClung present for the entire procedure.  ANESTHESIA:  general, local  FLUIDS: Per anesthesia record.   ESTIMATED BLOOD LOSS: minimal  PREOPERATIVE DIAGNOSES:  1.  Left knee synovitis 2.  Left knee patella chondromalacia  POSTOPERATIVE DIAGNOSES:  1.  Left knee synovitis 2.  Left knee medial femoral condyle grade II chondromalacia 3.  Left knee medial tibial plateau grade III and IV chondromalacia 4.  Left knee medial meniscus posterior horn radial tear  PROCEDURES PERFORMED:  1.  Left knee arthroscopy with limited synovectomy 2.  Left knee arthroscopy with arthroscopic partial medial meniscectomy 3.  Left knee arthroscopy with arthroscopic chondroplasty medial femoral condyle and medial tibial plateau.  And patella  DESCRIPTION OF PROCEDURE: [name] is a 67 y.o.-year-old male with left knee symptomatic chondromalacia of the patella with recurrent effusions. Plans are to proceed with chondroplasty and possible microfracture as well as meniscectomies and diagnostic arthroscopy with debridement as indicated. Full discussion held regarding risks benefits alternatives and complications related surgical intervention. Conservative care options reviewed. All questions answered.  The patient was identified in the preoperative holding area and the operative extremity was marked. The patient was brought to the operating room and transferred to operating table in a supine position. Satisfactory general anesthesia was induced by anesthesiology.    Standard anterolateral, anteromedial arthroscopy portals were obtained. The anteromedial portal was obtained with a spinal needle for localization under direct visualization with subsequent diagnostic findings.   Anteromedial and anterolateral chambers: moderate synovitis. The synovitis was debrided with a 4.5 mm  full radius shaver through both the anteromedial and lateral portals.   Suprapatellar pouch and gutters: moderate synovitis or debris. Patella chondral surface: Grade 1 Trochlear chondral surface: Grade 0 Patellofemoral tracking: Midline and level Medial meniscus: Radial tear just adjacent to the root in the white to white-red zone but the root itself was intact of at least 50%.  This was unstable however when probed..  Medial femoral condyle flexion bearing surface: Grade 2 Medial femoral condyle extension bearing surface: Grade 2 Medial tibial plateau: Grade 4 Anterior cruciate ligament:stable Posterior cruciate ligament:stable Lateral meniscus: Stable without tear.   Lateral femoral condyle flexion bearing surface: Grade 0 Lateral femoral condyle extension bearing surface: Grade 0 Lateral tibial plateau: Grade 0  Partial medial meniscectomy was carried out with combination of meniscal biter and motorized shaver to remove the unstable flap tear from the radial morphology.  Care was taken to contour the residual posterior horn.  Chondroplasty carried out with motorized shaver along the medial femoral condyle unstable type II chondromalacia as well as medial tibial plateau unstable type III and IV chondromalacia.  The type IV chondromalacia was on the central portion of the weightbearing surface.  After completion of synovectomy, diagnostic exam, and debridements as described, all compartments were checked and no residual debris remained. Hemostasis was achieved with the cautery wand. The portals were approximated with buried monocryl. All excess fluid was expressed from the joint.  Xeroform sterile gauze dressings were applied followed by Ace bandage and ice pack.   DISPOSITION: The patient was awakened from general anesthetic, extubated, taken to the recovery room in medically stable condition, no apparent complications. The patient may be weightbearing as tolerated to the operative lower  extremity.  Range of motion of right knee as tolerated.

## 2023-05-25 NOTE — Brief Op Note (Signed)
05/25/2023  4:44 PM  PATIENT:  Joseph Greer  67 y.o. male  PRE-OPERATIVE DIAGNOSIS:  Left knee patella chondromalacia  POST-OPERATIVE DIAGNOSIS:  Left knee patella chondromalacia, medial plateau chondromalacia, medial femoral condyle chondromalacia, medial meniscus posterior horn radial tear  PROCEDURE:  Procedure(s) with comments: ARTHROSCOPY KNEE (Left) - 60 CHONDROPLASTY AND MEDIAL PARTIAL MENISCECTOMY (Left)  SURGEON:  Surgeons and Role:    * Yolonda Kida, MD - Primary  PHYSICIAN ASSISTANT:  Dion Saucier, PA-C  ANESTHESIA:   local and general  EBL:  0 mL   BLOOD ADMINISTERED:none  DRAINS: none   LOCAL MEDICATIONS USED:  MARCAINE     SPECIMEN:  No Specimen  DISPOSITION OF SPECIMEN:  N/A  COUNTS:  YES  TOURNIQUET:  * No tourniquets in log *  DICTATION: .Note written in EPIC  PLAN OF CARE: Admit to inpatient   PATIENT DISPOSITION:  PACU - hemodynamically stable.   Delay start of Pharmacological VTE agent (>24hrs) due to surgical blood loss or risk of bleeding: not applicable

## 2023-05-25 NOTE — Discharge Instructions (Addendum)

## 2023-05-25 NOTE — H&P (Signed)
ORTHOPAEDIC H&P  REQUESTING PHYSICIAN: Yolonda Kida, MD  PCP:  Kirstie Peri, MD  Chief Complaint: Left knee pain  HPI: Joseph Greer is a 67 y.o. male who complains of left knee pain, catching as well as swelling.  Today for arthroscopic intervention.  No new complaints.  Past Medical History:  Diagnosis Date   Anxiety    CAD (coronary artery disease)    a. s/p CABG on 12/19/2019 with LIMA-LAD, seq SVG-RPDA-PL, RIMA-RI and SVG-D2 with Maze and LAA clipping   Cancer (HCC)    skin   CHF (congestive heart failure) (HCC)    a. EF 20-25% by echo in 12/2019   CKD (chronic kidney disease), stage III (HCC)    Depression    Diabetes mellitus type 2, noninsulin dependent (HCC)    pt denies   Hepatitis C    treated and cured per patient   Hypertension    Psoriasis    Status post liver transplant [facility])    Past Surgical History:  Procedure Laterality Date   CLIPPING OF ATRIAL APPENDAGE N/A 12/19/2019   Procedure: CLIPPING OF ATRIAL APPENDAGE USING ATRICURE CLIP SIZE ;  Surgeon: Linden Dolin, MD;  Location: [facility] OR;  Service: Open Heart Surgery;  Laterality: N/A;   COLONOSCOPY     COLONOSCOPY WITH PROPOFOL N/A 06/01/2017   Procedure: COLONOSCOPY WITH PROPOFOL;  Surgeon: Malissa Hippo, MD;  Location: [facility] ENDO SUITE;  Service: Endoscopy;  Laterality: N/A;  7:30   CORONARY ARTERY BYPASS GRAFT N/A 12/19/2019   Procedure: CORONARY ARTERY BYPASS GRAFTING (CABG) TIMES  X 5,   USING BILATERAL MAMMARY ARTERY AND RIGHT LEG GREATER SAPHENOUS VEIN HARVESTED ENDOSCOPICALLY;  Surgeon: Linden Dolin, MD;  Location: [facility] OR;  Service: Open Heart Surgery;  Laterality: N/A;   HERNIA REPAIR Right    inguinal   IR FLUORO GUIDE CV LINE RIGHT  12/30/2019   IR REMOVAL TUN CV CATH W/O FL  02/12/2020   IR US GUIDE VASC ACCESS RIGHT  12/30/2019   LEFT HEART CATH AND CORONARY ANGIOGRAPHY N/A 12/12/2019   Procedure: LEFT HEART CATH AND CORONARY ANGIOGRAPHY;  Surgeon: Corky Crafts, MD;  Location: [facility] INVASIVE CV LAB;  Service: Cardiovascular;  Laterality: N/A;   LIVER BIOPSY     LIVER SURGERY  2009   transplant   MAZE N/A 12/19/2019   Procedure: MAZE;  Surgeon: Linden Dolin, MD;  Location: [facility] OR;  Service: Open Heart Surgery;  Laterality: N/A;   POLYPECTOMY  06/01/2017   Procedure: POLYPECTOMY;  Surgeon: Malissa Hippo, MD;  [facility]: [facility];  Service: Endoscopy;;  polyp at sigmoid colon x2, ascending colon polyp, hepatic flexure polyp   RIGHT HEART CATH N/A 12/17/2019   Procedure: RIGHT HEART CATH;  Surgeon: Laurey Morale, MD;  Location: [facility] INVASIVE CV LAB;  Service: Cardiovascular;  Laterality: N/A;   TEE WITHOUT CARDIOVERSION N/A 12/19/2019   Procedure: TRANSESOPHAGEAL ECHOCARDIOGRAM (TEE);  Surgeon: Linden Dolin, MD;  Location: [facility] OR;  Service: Open Heart Surgery;  Laterality: N/A;   UPPER GASTROINTESTINAL ENDOSCOPY     Social History   Socioeconomic History   Marital status: Widowed    Spouse name: Not on file   Number of children: 1   Years of education: Not on file   Highest education level: Not on file  Occupational History   Not on file  Tobacco Use   Smoking status: Former    Current packs/day: 0.00    Types: Cigarettes  Quit date: 09/04/2007    Years since quitting: 15.7   Smokeless tobacco: Never  Vaping Use   Vaping status: Never Used  Substance and Sexual Activity   Alcohol use: No   Drug use: No   Sexual activity: Yes    Birth control/protection: None  Other Topics Concern   Not on file  Social History Narrative   Not on file   Social Drivers of Health   Financial Resource Strain: Low Risk  (03/23/2022)   Received from [facility], [facility]   Overall Financial Resource Strain (CARDIA)    Difficulty of Paying Living Expenses: Not hard at all  Food Insecurity: No Food Insecurity (03/23/2022)   Received from [facility], [facility]   Hunger Vital Sign    Worried About Running  Out of Food in the Last Year: Never true    Ran Out of Food in the Last Year: Never true  Transportation Needs: No Transportation Needs (03/23/2022)   Received from [facility], [facility]   [facility] - Transportation    Lack of Transportation (Medical): No    Lack of Transportation (Non-Medical): No  Physical Activity: Unknown (06/15/2022)   Received from [facility]   Exercise Vital Sign    Days of Exercise per Week: 7 days    Minutes of Exercise per Session: Not on file  Stress: No Stress Concern Present (03/23/2022)   Received from [facility], [facility] of Occupational Health - Occupational Stress Questionnaire    Feeling of Stress : Not at all  Social Connections: Moderately Integrated (03/23/2022)   Received from [facility], [facility]   Social Connection and Isolation Panel [NHANES]    Frequency of Communication with Friends and Family: More than three times a week    Frequency of Social Gatherings with Friends and Family: More than three times a week    Attends Religious Services: More than 4 times per year    Active Member of [employer] or Organizations: Yes    Attends Banker Meetings: More than 4 times per year    Marital Status: Widowed   Family History  Problem Relation Age of Onset   Diabetes Mother    Heart disease Father    Heart disease Son    Allergies  Allergen Reactions   Morphine And Codeine Nausea And Vomiting   Prior to Admission medications   Medication Sig Start Date End Date Taking? Authorizing Provider  apixaban (ELIQUIS) 5 MG TABS tablet Take 1 tablet (5 mg total) by mouth 2 (two) times daily. 03/05/23  Yes Laurey Morale, MD  Apremilast (OTEZLA) 30 MG TABS Take 30 mg by mouth 2 (two) times daily. 06/16/19  Yes [provider]  carvedilol (COREG) 12.5 MG tablet Take 1 tablet (12.5 mg total) by mouth 2 (two) times daily with a meal. Please schedule an appointment for further refills  04/23/23  Yes Laurey Morale, MD  dapagliflozin propanediol (FARXIGA) 10 MG TABS tablet TAKE 1 TABLET BY MOUTH ONCE DAILY BEFORE BREAKFAST Patient taking differently: Take 10 mg by mouth daily. 11/13/22  Yes Laurey Morale, MD  ENTRESTO 24-26 MG TAKE 1 TABLET BY MOUTH TWICE DAILY . APPOINTMENT REQUIRED FOR FUTURE REFILLS 03/06/23  Yes Laurey Morale, MD  ergocalciferol (VITAMIN D2) 1.25 MG (50000 UT) capsule Take 50,000 Units by mouth once a week.   Yes [provider]  ferrous sulfate  325 (65 FE) MG EC tablet Take 325 mg by mouth 2 (two) times daily.   Yes [provider]  FLUoxetine (PROZAC) 20 MG capsule Take 20 mg by mouth in the morning. 01/29/22  Yes [provider]  ibuprofen (ADVIL) 200 MG tablet Take 200 mg by mouth in the morning, at noon, and at bedtime.   Yes [provider]  LORazepam (ATIVAN) 1 MG tablet Take 1 mg by mouth 2 (two) times daily as needed (panic attacks).  11/28/19  Yes [provider]  multivitamin (ONE-A-DAY MEN'S) TABS tablet Take 1 tablet by mouth in the morning.   Yes [provider]  spironolactone (ALDACTONE) 25 MG tablet Take 1/2 (one-half) tablet by mouth once daily 01/24/23  Yes Milford, Jessica M, FNP  tacrolimus (PROGRAF) 1 MG capsule Take 1 mg by mouth 2 (two) times daily.   Yes [provider]  traMADol (ULTRAM) 50 MG tablet Take 50 mg by mouth in the morning and at bedtime.   Yes [provider]  triamcinolone cream (KENALOG) 0.1 % Apply 1 application  topically 2 (two) times daily as needed (skin irritation.).   Yes [provider]  vitamin B-12 (CYANOCOBALAMIN) 500 MCG tablet Take 500 mcg by mouth in the morning.   Yes [provider]  atorvastatin (LIPITOR) 80 MG tablet Take 1 tablet (80 mg total) by mouth daily. Patient taking differently: Take 80 mg by mouth every evening. 04/24/23   Laurey Morale, MD   No results found.  Positive ROS: All other systems  have been reviewed and were otherwise negative with the exception of those mentioned in the HPI and as above.  Physical Exam: General: Alert, no acute distress Cardiovascular: No pedal edema Respiratory: No cyanosis, no use of accessory musculature GI: No organomegaly, abdomen is soft and non-tender Skin: No lesions in the area of chief complaint Neurologic: Sensation intact distally Psychiatric: Patient is competent for consent with normal mood and affect Lymphatic: No axillary or cervical lymphadenopathy  MUSCULOSKELETAL: Left lower extremity is warm and well-perfused with no open wounds or lesions.  Assessment: 1.  Left knee chondral defect patella 2.  Left knee synovitis  Plan: Plan to proceed with arthroscopic surgery on the left knee with chondroplasty and possible microfracture depending on the containment of the lesion and if its full-thickness.  He understands that.  We discussed postoperative precautions depending on the implementation of microfracture or not.  Otherwise, we discussed the risk of bleeding, infection, damage to surrounding nerves and vessels, stiffness, persistent pain, development of progressive arthritis, DVT risk as well as the risk of anesthesia.  He has provided informed consent.  DC home postop from PACU.    Yolonda Kida, MD Cell [phone]    05/25/2023 2:08 PM

## 2023-05-25 NOTE — Transfer of Care (Signed)
Immediate Anesthesia Transfer of Care Note  Patient: Joseph Greer [facility]  Procedure(s) Performed: ARTHROSCOPY KNEE (Left: Knee) CHONDROPLASTY AND MEDIAL PARTIAL MENISCECTOMY (Left: Knee)  Patient Location: PACU  Anesthesia Type:General  Level of Consciousness: awake, alert , oriented, and patient cooperative  Airway & Oxygen Therapy: Patient Spontanous Breathing and Patient connected to face mask oxygen  Post-op Assessment: Report given to RN and Post -op Vital signs reviewed and stable  Post vital signs: Reviewed and stable  Last Vitals:  Vitals Value Taken Time  BP 137/74 05/25/23 1700  Temp    Pulse 74 05/25/23 1713  Resp 12 05/25/23 1713  SpO2 92 % 05/25/23 1713  Vitals shown include unfiled device data.  Last Pain:  Vitals:   05/25/23 1351  TempSrc: Oral  PainSc: 2          Complications: No notable events documented.

## 2023-05-25 NOTE — Anesthesia Procedure Notes (Signed)
Procedure Name: LMA Insertion Date/Time: 05/25/2023 4:21 PM  Performed by: Wilder Glade, CRNAPre-anesthesia Checklist: Patient identified, Emergency Drugs available, Suction available, Patient being monitored and Timeout performed Patient Re-evaluated:Patient Re-evaluated prior to induction Oxygen Delivery Method: Circle system utilized Preoxygenation: Pre-oxygenation with 100% oxygen Induction Type: IV induction Ventilation: Mask ventilation without difficulty LMA: LMA inserted LMA Size: 4.0 Number of attempts: 1 Placement Confirmation: positive ETCO2 and breath sounds checked- equal and bilateral ETT to lip (cm): yes. Tube secured with: Tape Dental Injury: Teeth and Oropharynx as per pre-operative assessment  Comments: Smooth induction LMA placed easily dentition unchanged

## 2023-05-28 ENCOUNTER — Other Ambulatory Visit (HOSPITAL_COMMUNITY): Payer: Self-pay | Admitting: Cardiology

## 2023-05-28 ENCOUNTER — Encounter (HOSPITAL_COMMUNITY): Payer: Self-pay | Admitting: Orthopedic Surgery

## 2023-05-28 NOTE — Anesthesia Postprocedure Evaluation (Signed)
Anesthesia Post Note  Patient: Green Scherr [facility]  Procedure(s) Performed: ARTHROSCOPY KNEE (Left: Knee) CHONDROPLASTY AND MEDIAL PARTIAL MENISCECTOMY (Left: Knee)     Patient location during evaluation: PACU Anesthesia Type: General Level of consciousness: awake and alert, patient cooperative and oriented Pain control: pain improving. Vital Signs Assessment: post-procedure vital signs reviewed and stable Respiratory status: spontaneous breathing, nonlabored ventilation and respiratory function stable Cardiovascular status: blood pressure returned to baseline and stable Postop Assessment: no apparent nausea or vomiting Anesthetic complications: no Comments: *delayed note entry, pt eval in PACU post op   No notable events documented.  Last Vitals:  Vitals:   05/25/23 1730 05/25/23 1745  BP: 134/71 138/76  Pulse: 77 75  Resp: 14 15  Temp:  36.6 C  SpO2: 95% 94%    Last Pain:  Vitals:   05/25/23 1730  TempSrc:   PainSc: 5                  Devian Bartolomei,E. Christhoper Busbee

## 2023-06-01 DIAGNOSIS — M25662 Stiffness of left knee, not elsewhere classified: Secondary | ICD-10-CM | POA: Diagnosis not present

## 2023-06-01 DIAGNOSIS — M25562 Pain in left knee: Secondary | ICD-10-CM | POA: Diagnosis not present

## 2023-06-01 NOTE — Progress Notes (Signed)
Advanced Heart Failure Clinic Note   PCP: Kirstie Peri, MD Nephrology: Dr. Wolfgang Phoenix  HF Cardiology: Dr. Shirlee Latch   HPI: 67 y.o. male w/ h/o HCV s/p liver transplant in 2009 (followed at [facility]), Stage III CKD, HTN and T2DM.   He was admitted 7/21 w/ NSTEMI and Afib w/ RVR, found to have severe 3VD and biventricular heart failure, LVEF 20-25%, RV moderately reduced. Underwent CABG + MAZE + LA appendage clipping 12/19/19. Required milrinone post operatively. Post op course complicated by ATN/ renal failure. Required CVVHD but remained anuric. Tunneled HD cath placed and transitioned to iHD, MWF.  He was able to transition off HD as an outpatient.   Repeat echo done in 10/21 with EF 20-25%, moderate LV dilation, mild-moderately decreased RV systolic function, moderate MR.   Echo in 4/22 showed EF 30%, mid anterior and inferior severe hypokinesis, basal to mid anterolateral and inferolateral akinesis, mildly decreased RV systolic function.   Admitted to [facility] 05/09/22-05/14/22 with AKI and acute ischemic liver injury in setting of cocaine use. Suspected ischemic injury 2/2 hypoperfusion from coronary vasospasm. AST/ALT peaked to 4,000s and Scr up to 8.1. He required CRRT briefly (12/05-12/06). Scr improved to 2.3 (near baseline).  Echo during this admit with EF 20%, mildly reduced RV, mild AI, mild MR, mild TR. GDMT held during admit but resumed at discharge.  Post hospital follow up 12/23, he was again off all GDMT. NYHA I-II and volume stable.  Admitted 1/24 at [facility] with unintentional opiate overdose (cocaine was contaminated). He was placed on Narcan gtt, and treated for multifocal PNA and a/c CHF. Required IV Lasix for diuresis. Continued on all HF meds at d/c.  He does have anemia of chronic disease and is getting scheduled Procrit injections.   Echo was done today and reviewed, EF 25-30% with moderate LV dilation, akinesis of inferolateral/inferior/anterolateral walls, severe LAE, mild MR,  normal RV size with mildly decreased systolic function.   He returns today for followup of CHF.  No cocaine use since 1/24.  Weight is up but he feels like he is now at a healthy weight.  He is working as a Chief Executive Officer at [employer].  Main problem currently is bilateral knee pain.  He has no significant exertional dyspnea or chest pain.  No orthopnea/PND.  No lightheadedness.   Labs (9/21): LDL 66, HDL 44 Labs (10/21): K 3.9, creatinine 2.02 Labs (12/21): K 3.7, creatinine 1.58 Labs (9/21): LDL 66 Labs (1/22): K 4.1, creatinine 1.6 Labs (3/22): K 4.6, creatinine 1.56 => 2.09 Labs (4/22): LDL 66 Labs (8/22): K 5.5, creatinine 1.92 Labs (2/23): K 6.2, creatinine 2.36 Labs (6/23): K 5.2, creatinine 2.12 Labs (12/23): K 5.0, creatinine 2.15 Labs (1/24): K 3.9, creatinine 1.9 Labs (11/24): K 4.9, creatinine 1.4  Review of systems complete and found to be negative unless listed in HPI.    PMH: 1. CKD stage III: He was on HD after CABG in 7/21 but renal function recovered.  2. HCV with cirrhosis, s/p liver transplant in 2009.  3. HTN 4. Psoriasis 5. Type 2 diabetes 6. Hyperlipidemia 7. CAD: NSTEMI 7/21.  - CABG (7/21) with LIMA-LAD, RIMA-ramus, sequential SVG-PDA and PLV, SVG -D 8. Atrial fibrillation: Paroxysmal.  - S/p Maze and LA appendage clip with CABG in 7/21.  9. Chronic systolic CHF: Ischemic cardiomyopathy.  - Echo (10/21): EF 20-25%, moderate LV dilation, mild-moderately decreased RV systolic function, moderate MR.  - Echo (4/22): EF 30%, mid anterior and inferior severe hypokinesis, basal to  mid anterolateral and inferolateral akinesis, mildly decreased RV systolic function. - Echo (12/23) at [facility]: EF 20%, LV RWMA, mildly reduced RV, mild MR, mild AI, mild TR - Echo (11/24): EF 25-30% with moderate LV dilation, akinesis of inferolateral/inferior/anterolateral walls, severe LAE, mild MR, normal RV size with mildly decreased systolic function. 10. Cocaine abuse: Has  quit.  Current Outpatient Medications  Medication Sig Dispense Refill   apixaban (ELIQUIS) 5 MG TABS tablet Take 1 tablet (5 mg total) by mouth 2 (two) times daily. 60 tablet 1   Apremilast (OTEZLA) 30 MG TABS Take 30 mg by mouth 2 (two) times daily.     atorvastatin (LIPITOR) 80 MG tablet Take 1 tablet (80 mg total) by mouth daily. (Patient taking differently: Take 80 mg by mouth every evening.) 90 tablet 3   carvedilol (COREG) 12.5 MG tablet Take 1 tablet (12.5 mg total) by mouth 2 (two) times daily with a meal. Please schedule an appointment for further refills 180 tablet 3   dapagliflozin propanediol (FARXIGA) 10 MG TABS tablet TAKE 1 TABLET BY MOUTH ONCE DAILY BEFORE BREAKFAST (Patient taking differently: Take 10 mg by mouth daily.) 90 tablet 3   ENTRESTO 24-26 MG TAKE 1 TABLET BY MOUTH TWICE DAILY *APPOINTMENT  REQUIRED  FOR  FUTURE  REFILLS* 180 tablet 0   ergocalciferol (VITAMIN D2) 1.25 MG (50000 UT) capsule Take 50,000 Units by mouth once a week.     ferrous sulfate 325 (65 FE) MG EC tablet Take 325 mg by mouth 2 (two) times daily.     FLUoxetine (PROZAC) 20 MG capsule Take 20 mg by mouth in the morning.     HYDROcodone-acetaminophen (NORCO) 5-325 MG tablet Take 1 tablet by mouth every 6 (six) hours as needed for moderate pain (pain score 4-6). 30 tablet 0   ibuprofen (ADVIL) 200 MG tablet Take 200 mg by mouth in the morning, at noon, and at bedtime.     LORazepam (ATIVAN) 1 MG tablet Take 1 mg by mouth 2 (two) times daily as needed (panic attacks).      multivitamin (ONE-A-DAY MEN'S) TABS tablet Take 1 tablet by mouth in the morning.     ondansetron (ZOFRAN-ODT) 4 MG disintegrating tablet Take 1 tablet (4 mg total) by mouth every 8 (eight) hours as needed for nausea or vomiting. 20 tablet 0   spironolactone (ALDACTONE) 25 MG tablet Take 1/2 (one-half) tablet by mouth once daily 90 tablet 0   tacrolimus (PROGRAF) 1 MG capsule Take 1 mg by mouth 2 (two) times daily.     traMADol (ULTRAM)  50 MG tablet Take 50 mg by mouth in the morning and at bedtime.     triamcinolone cream (KENALOG) 0.1 % Apply 1 application  topically 2 (two) times daily as needed (skin irritation.).     vitamin B-12 (CYANOCOBALAMIN) 500 MCG tablet Take 500 mcg by mouth in the morning.     No current facility-administered medications for this visit.   Allergies  Allergen Reactions   Morphine And Codeine Nausea And Vomiting   Social History   Socioeconomic History   Marital status: Widowed    Spouse name: Not on file   Number of children: 1   Years of education: Not on file   Highest education level: Not on file  Occupational History   Not on file  Tobacco Use   Smoking status: Former    Current packs/day: 0.00    Types: Cigarettes    Quit date: 09/04/2007    Years  since quitting: 15.7   Smokeless tobacco: Never  Vaping Use   Vaping status: Never Used  Substance and Sexual Activity   Alcohol use: No   Drug use: No   Sexual activity: Yes    Birth control/protection: None  Other Topics Concern   Not on file  Social History Narrative   Not on file   Social Drivers of Health   Financial Resource Strain: Low Risk  (03/23/2022)   Received from [facility], [facility]   Overall Financial Resource Strain (CARDIA)    Difficulty of Paying Living Expenses: Not hard at all  Food Insecurity: No Food Insecurity (03/23/2022)   Received from [facility], [facility]   Hunger Vital Sign    Worried About Running Out of Food in the Last Year: Never true    Ran Out of Food in the Last Year: Never true  Transportation Needs: No Transportation Needs (03/23/2022)   Received from [facility], [facility]   [facility] - Transportation    Lack of Transportation (Medical): No    Lack of Transportation (Non-Medical): No  Physical Activity: Unknown (06/15/2022)   Received from [facility]   Exercise Vital Sign    Days of Exercise per Week: 7 days    Minutes of Exercise per  Session: Not on file  Stress: No Stress Concern Present (03/23/2022)   Received from [facility], [facility] of Occupational Health - Occupational Stress Questionnaire    Feeling of Stress : Not at all  Social Connections: Moderately Integrated (03/23/2022)   Received from [facility], [facility]   Social Connection and Isolation Panel [NHANES]    Frequency of Communication with Friends and Family: More than three times a week    Frequency of Social Gatherings with Friends and Family: More than three times a week    Attends Religious Services: More than 4 times per year    Active Member of [employer] or Organizations: Yes    Attends Banker Meetings: More than 4 times per year    Marital Status: Widowed  Intimate Partner Violence: Not At Risk (03/23/2022)   Received from [facility], [facility]   Humiliation, Afraid, Rape, and Kick questionnaire    Fear of Current or Ex-Partner: No    Emotionally Abused: No    Physically Abused: No    Sexually Abused: No   Family History  Problem Relation Age of Onset   Diabetes Mother    Heart disease Father    Heart disease Son    There were no vitals taken for this visit.  Wt Readings from Last 3 Encounters:  05/25/23 75.8 kg (167 lb)  05/23/23 75.8 kg (167 lb)  04/23/23 78.7 kg (173 lb 6.4 oz)   PHYSICAL EXAM: General: NAD Neck: No JVD, no thyromegaly or thyroid nodule.  Lungs: Clear to auscultation bilaterally with normal respiratory effort. CV: Nondisplaced PMI.  Heart regular S1/S2, no S3/S4, no murmur.  No peripheral edema.  No carotid bruit.  Normal pedal pulses.  Abdomen: Soft, nontender, no hepatosplenomegaly, no distention.  Skin: Intact without lesions or rashes.  Neurologic: Alert and oriented x 3.  Psych: Normal affect. Extremities: No clubbing or cyanosis.  HEENT: Normal.   ASSESSMENT & PLAN: 1. CAD: NSTEMI in 7/21.  Suspect out of hospital inferolateral MI.   Cardiac MRI showed that the inferolateral wall was likely not viable, but the remainder  of the LV myocardium was likely viable. He had CABG 12/19/19 with LIMA-LAD, RIMA-ramus, seq SVG-PD/PL, SVG-D.  No chest pain.   - No ASA given Eliquis use.  - Continue atorvastatin 80 mg daily.  Check lipids today.  2. Chronic Systolic CHF: Ischemic cardiomyopathy.  Echo 7/21 with EF 20-25%, moderately decreased RV systolic function. Cardiac MRI with LV EF 26%, RV EF 38%, near full thickness scar in inferolateral wall suggesting lack of viability in the LCx territory.  Now post-CABG.  Echo in 10/21 showed EF 20-25%, moderate LV dilation, mild-moderately decreased RV systolic function, moderate MR.  Echo in 4/22 showed EF 30%, mid anterior and inferior severe hypokinesis, basal to mid anterolateral and inferolateral akinesis, mildly decreased RV systolic function.  Echo at [facility] 12/23 with EF 20%, regional WMA in LV, mildly reduced RV. Echo today showed EF 25-30% with moderate LV dilation, akinesis of inferolateral/inferior/anterolateral walls, severe LAE, mild MR, normal RV size with mildly decreased systolic function. NYHA class II symptoms, not volume overloaded on exam.  - He is not taking Lasix. - Continue Entresto 24/26 mg bid.  - Continue spironolactone 12.5 mg daily. BMET/BNP today.  - Increase Coreg to 12.5 mg bid.  - Continue Farxiga 10 mg daily. - He has decided that he does not want an ICD, reiterates this today.   - Recommended he abstain from cocaine use. 3. Atrial fibrillation:  S/p Maze + LAA clipping on 7/16.  He is off amiodarone. NSR today.  - Continue Eliquis 5 mg bid. CBC today.   4. CKD: Stage 3.  He required HD after CABG but renal function recovered. He is followed by nephrology - BMET today.  - Continue Farxiga. 5. Liver transplant for HCV: Followed at [facility]. On Tacrolimus.  6. Cocaine abuse: He has quit since 1/24.  7. Anemia of Chronic Disease:  Gets Procrit.   Follow up in 6 wks with  [facility] for medication titration.   Anderson Malta [place], FNP 06/01/23

## 2023-06-04 ENCOUNTER — Ambulatory Visit (HOSPITAL_COMMUNITY)
Admission: RE | Admit: 2023-06-04 | Discharge: 2023-06-04 | Disposition: A | Discharge status: Home / Self Care | Payer: Medicare HMO | Source: Ambulatory Visit | Attending: Family Medicine | Admitting: Family Medicine

## 2023-06-04 ENCOUNTER — Encounter (HOSPITAL_COMMUNITY): Payer: Self-pay

## 2023-06-04 VITALS — BP 138/74 | HR 67 | Wt 165.8 lb

## 2023-06-04 DIAGNOSIS — R531 Weakness: Secondary | ICD-10-CM | POA: Diagnosis not present

## 2023-06-04 DIAGNOSIS — I130 Hypertensive heart and chronic kidney disease with heart failure and stage 1 through stage 4 chronic kidney disease, or unspecified chronic kidney disease: Secondary | ICD-10-CM | POA: Insufficient documentation

## 2023-06-04 DIAGNOSIS — Z951 Presence of aortocoronary bypass graft: Secondary | ICD-10-CM | POA: Diagnosis not present

## 2023-06-04 DIAGNOSIS — D649 Anemia, unspecified: Secondary | ICD-10-CM

## 2023-06-04 DIAGNOSIS — I255 Ischemic cardiomyopathy: Secondary | ICD-10-CM | POA: Diagnosis not present

## 2023-06-04 DIAGNOSIS — F1411 Cocaine abuse, in remission: Secondary | ICD-10-CM

## 2023-06-04 DIAGNOSIS — Z79621 Long term (current) use of calcineurin inhibitor: Secondary | ICD-10-CM | POA: Diagnosis not present

## 2023-06-04 DIAGNOSIS — I5022 Chronic systolic (congestive) heart failure: Secondary | ICD-10-CM | POA: Diagnosis not present

## 2023-06-04 DIAGNOSIS — E1122 Type 2 diabetes mellitus with diabetic chronic kidney disease: Secondary | ICD-10-CM | POA: Diagnosis not present

## 2023-06-04 DIAGNOSIS — Z79899 Other long term (current) drug therapy: Secondary | ICD-10-CM | POA: Insufficient documentation

## 2023-06-04 DIAGNOSIS — I480 Paroxysmal atrial fibrillation: Secondary | ICD-10-CM | POA: Diagnosis not present

## 2023-06-04 DIAGNOSIS — Z944 Liver transplant status: Secondary | ICD-10-CM | POA: Insufficient documentation

## 2023-06-04 DIAGNOSIS — D638 Anemia in other chronic diseases classified elsewhere: Secondary | ICD-10-CM | POA: Insufficient documentation

## 2023-06-04 DIAGNOSIS — N1832 Chronic kidney disease, stage 3b: Secondary | ICD-10-CM | POA: Diagnosis not present

## 2023-06-04 DIAGNOSIS — I2510 Atherosclerotic heart disease of native coronary artery without angina pectoris: Secondary | ICD-10-CM | POA: Insufficient documentation

## 2023-06-04 DIAGNOSIS — N183 Chronic kidney disease, stage 3 unspecified: Secondary | ICD-10-CM | POA: Insufficient documentation

## 2023-06-04 DIAGNOSIS — Z7901 Long term (current) use of anticoagulants: Secondary | ICD-10-CM | POA: Diagnosis not present

## 2023-06-04 DIAGNOSIS — I252 Old myocardial infarction: Secondary | ICD-10-CM | POA: Diagnosis not present

## 2023-06-04 DIAGNOSIS — Z7984 Long term (current) use of oral hypoglycemic drugs: Secondary | ICD-10-CM | POA: Insufficient documentation

## 2023-06-04 NOTE — Addendum Note (Signed)
Encounter addended by: Demetrius Charity, RN on: 06/04/2023 2:59 PM  Actions taken: Clinical Note Signed, Flowsheet accepted

## 2023-06-04 NOTE — Patient Instructions (Addendum)
Thank you for coming in today  If you had labs drawn today, any labs that are abnormal the clinic will call you No news is good news  Medications: No changes  Follow up appointments:  Your physician recommends that you schedule a follow-up appointment in:  3-4 months with With Dr. Shirlee Latch    Do the following things EVERYDAY: Weigh yourself in the morning before breakfast. Write it down and keep it in a log. Take your medicines as prescribed Eat low salt foods--Limit salt (sodium) to 2000 mg per day.  Stay as active as you can everyday Limit all fluids for the day to less than 2 liters   At the [facility], you and your health needs are our priority. As part of our continuing mission to provide you with exceptional heart care, we have created designated Provider Care Teams. These Care Teams include your primary Cardiologist (physician) and Advanced Practice Providers (APPs- Physician Assistants and Nurse Practitioners) who all work together to provide you with the care you need, when you need it.   You may see any of the following providers on your designated Care Team at your next follow up: Dr Arvilla Meres Dr Marca Ancona Dr. Marcos Eke, NP Robbie Lis, [place] [facility] [place], [place] Brynda Peon, NP Karle Plumber, PharmD   Please be sure to bring in all your medications bottles to every appointment.    Thank you for choosing [facility]  If you have any questions or concerns before your next appointment please send [place] a message through [place] or call our office at [phone].    TO LEAVE A MESSAGE FOR THE NURSE SELECT OPTION 2, PLEASE LEAVE A MESSAGE INCLUDING: YOUR NAME DATE OF BIRTH CALL BACK NUMBER REASON FOR CALL**this is important as we prioritize the call backs  YOU WILL RECEIVE A CALL BACK THE SAME DAY AS LONG AS YOU CALL BEFORE 4:00 PM

## 2023-06-19 DIAGNOSIS — Z299 Encounter for prophylactic measures, unspecified: Secondary | ICD-10-CM | POA: Diagnosis not present

## 2023-06-19 DIAGNOSIS — I482 Chronic atrial fibrillation, unspecified: Secondary | ICD-10-CM | POA: Diagnosis not present

## 2023-06-19 DIAGNOSIS — I5021 Acute systolic (congestive) heart failure: Secondary | ICD-10-CM | POA: Diagnosis not present

## 2023-06-19 DIAGNOSIS — I10 Essential (primary) hypertension: Secondary | ICD-10-CM | POA: Diagnosis not present

## 2023-06-19 DIAGNOSIS — M778 Other enthesopathies, not elsewhere classified: Secondary | ICD-10-CM | POA: Diagnosis not present

## 2023-06-25 DIAGNOSIS — L570 Actinic keratosis: Secondary | ICD-10-CM | POA: Diagnosis not present

## 2023-06-28 DIAGNOSIS — M7711 Lateral epicondylitis, right elbow: Secondary | ICD-10-CM | POA: Diagnosis not present

## 2023-06-28 DIAGNOSIS — M9907 Segmental and somatic dysfunction of upper extremity: Secondary | ICD-10-CM | POA: Diagnosis not present

## 2023-06-28 DIAGNOSIS — M9901 Segmental and somatic dysfunction of cervical region: Secondary | ICD-10-CM | POA: Diagnosis not present

## 2023-06-28 DIAGNOSIS — M531 Cervicobrachial syndrome: Secondary | ICD-10-CM | POA: Diagnosis not present

## 2023-07-03 DIAGNOSIS — M531 Cervicobrachial syndrome: Secondary | ICD-10-CM | POA: Diagnosis not present

## 2023-07-03 DIAGNOSIS — M9907 Segmental and somatic dysfunction of upper extremity: Secondary | ICD-10-CM | POA: Diagnosis not present

## 2023-07-03 DIAGNOSIS — M9901 Segmental and somatic dysfunction of cervical region: Secondary | ICD-10-CM | POA: Diagnosis not present

## 2023-07-03 DIAGNOSIS — M7711 Lateral epicondylitis, right elbow: Secondary | ICD-10-CM | POA: Diagnosis not present

## 2023-07-05 DIAGNOSIS — M531 Cervicobrachial syndrome: Secondary | ICD-10-CM | POA: Diagnosis not present

## 2023-07-05 DIAGNOSIS — M9901 Segmental and somatic dysfunction of cervical region: Secondary | ICD-10-CM | POA: Diagnosis not present

## 2023-07-05 DIAGNOSIS — M9907 Segmental and somatic dysfunction of upper extremity: Secondary | ICD-10-CM | POA: Diagnosis not present

## 2023-07-05 DIAGNOSIS — M7711 Lateral epicondylitis, right elbow: Secondary | ICD-10-CM | POA: Diagnosis not present

## 2023-07-24 DIAGNOSIS — Z125 Encounter for screening for malignant neoplasm of prostate: Secondary | ICD-10-CM | POA: Diagnosis not present

## 2023-07-24 DIAGNOSIS — Z79899 Other long term (current) drug therapy: Secondary | ICD-10-CM | POA: Diagnosis not present

## 2023-07-24 DIAGNOSIS — E7800 Pure hypercholesterolemia, unspecified: Secondary | ICD-10-CM | POA: Diagnosis not present

## 2023-07-24 DIAGNOSIS — R5383 Other fatigue: Secondary | ICD-10-CM | POA: Diagnosis not present

## 2023-07-24 DIAGNOSIS — D849 Immunodeficiency, unspecified: Secondary | ICD-10-CM | POA: Diagnosis not present

## 2023-08-08 DIAGNOSIS — Z299 Encounter for prophylactic measures, unspecified: Secondary | ICD-10-CM | POA: Diagnosis not present

## 2023-08-08 DIAGNOSIS — F419 Anxiety disorder, unspecified: Secondary | ICD-10-CM | POA: Diagnosis not present

## 2023-08-08 DIAGNOSIS — L259 Unspecified contact dermatitis, unspecified cause: Secondary | ICD-10-CM | POA: Diagnosis not present

## 2023-08-08 DIAGNOSIS — I509 Heart failure, unspecified: Secondary | ICD-10-CM | POA: Diagnosis not present

## 2023-08-08 DIAGNOSIS — I10 Essential (primary) hypertension: Secondary | ICD-10-CM | POA: Diagnosis not present

## 2023-08-08 DIAGNOSIS — Z944 Liver transplant status: Secondary | ICD-10-CM | POA: Diagnosis not present

## 2023-08-15 ENCOUNTER — Other Ambulatory Visit: Payer: Self-pay | Admitting: Cardiology

## 2023-08-15 DIAGNOSIS — I509 Heart failure, unspecified: Secondary | ICD-10-CM | POA: Diagnosis not present

## 2023-08-15 DIAGNOSIS — Z299 Encounter for prophylactic measures, unspecified: Secondary | ICD-10-CM | POA: Diagnosis not present

## 2023-08-15 DIAGNOSIS — R52 Pain, unspecified: Secondary | ICD-10-CM | POA: Diagnosis not present

## 2023-08-15 DIAGNOSIS — R059 Cough, unspecified: Secondary | ICD-10-CM | POA: Diagnosis not present

## 2023-08-15 DIAGNOSIS — M25561 Pain in right knee: Secondary | ICD-10-CM | POA: Diagnosis not present

## 2023-08-15 DIAGNOSIS — U071 COVID-19: Secondary | ICD-10-CM | POA: Diagnosis not present

## 2023-08-15 DIAGNOSIS — J029 Acute pharyngitis, unspecified: Secondary | ICD-10-CM | POA: Diagnosis not present

## 2023-08-20 ENCOUNTER — Encounter: Payer: Self-pay | Admitting: Nephrology

## 2023-08-24 ENCOUNTER — Telehealth (HOSPITAL_COMMUNITY): Payer: Self-pay | Admitting: Cardiology

## 2023-08-27 ENCOUNTER — Ambulatory Visit (HOSPITAL_COMMUNITY)
Admission: RE | Admit: 2023-08-27 | Discharge: 2023-08-27 | Disposition: A | Discharge status: Home / Self Care | Source: Ambulatory Visit | Attending: Cardiology | Admitting: Cardiology

## 2023-08-27 ENCOUNTER — Encounter (HOSPITAL_COMMUNITY): Payer: Self-pay | Admitting: Cardiology

## 2023-08-27 ENCOUNTER — Inpatient Hospital Stay (HOSPITAL_COMMUNITY): Admission: RE | Admit: 2023-08-27 | Payer: Medicare HMO | Source: Ambulatory Visit | Admitting: Cardiology

## 2023-08-27 ENCOUNTER — Encounter: Payer: Self-pay | Admitting: Nephrology

## 2023-08-27 VITALS — BP 110/68 | HR 52

## 2023-08-27 DIAGNOSIS — Z951 Presence of aortocoronary bypass graft: Secondary | ICD-10-CM | POA: Diagnosis not present

## 2023-08-27 DIAGNOSIS — I5022 Chronic systolic (congestive) heart failure: Secondary | ICD-10-CM | POA: Insufficient documentation

## 2023-08-27 DIAGNOSIS — Z7901 Long term (current) use of anticoagulants: Secondary | ICD-10-CM | POA: Insufficient documentation

## 2023-08-27 DIAGNOSIS — D631 Anemia in chronic kidney disease: Secondary | ICD-10-CM | POA: Diagnosis not present

## 2023-08-27 DIAGNOSIS — Z8619 Personal history of other infectious and parasitic diseases: Secondary | ICD-10-CM | POA: Insufficient documentation

## 2023-08-27 DIAGNOSIS — N183 Chronic kidney disease, stage 3 unspecified: Secondary | ICD-10-CM | POA: Diagnosis not present

## 2023-08-27 DIAGNOSIS — I2510 Atherosclerotic heart disease of native coronary artery without angina pectoris: Secondary | ICD-10-CM | POA: Insufficient documentation

## 2023-08-27 DIAGNOSIS — I255 Ischemic cardiomyopathy: Secondary | ICD-10-CM | POA: Insufficient documentation

## 2023-08-27 DIAGNOSIS — I130 Hypertensive heart and chronic kidney disease with heart failure and stage 1 through stage 4 chronic kidney disease, or unspecified chronic kidney disease: Secondary | ICD-10-CM | POA: Diagnosis not present

## 2023-08-27 DIAGNOSIS — Z87891 Personal history of nicotine dependence: Secondary | ICD-10-CM | POA: Diagnosis not present

## 2023-08-27 DIAGNOSIS — Z944 Liver transplant status: Secondary | ICD-10-CM | POA: Insufficient documentation

## 2023-08-27 DIAGNOSIS — I252 Old myocardial infarction: Secondary | ICD-10-CM | POA: Diagnosis not present

## 2023-08-27 DIAGNOSIS — Z79621 Long term (current) use of calcineurin inhibitor: Secondary | ICD-10-CM | POA: Insufficient documentation

## 2023-08-27 DIAGNOSIS — Z79899 Other long term (current) drug therapy: Secondary | ICD-10-CM | POA: Insufficient documentation

## 2023-08-27 DIAGNOSIS — I480 Paroxysmal atrial fibrillation: Secondary | ICD-10-CM | POA: Insufficient documentation

## 2023-08-27 DIAGNOSIS — E1122 Type 2 diabetes mellitus with diabetic chronic kidney disease: Secondary | ICD-10-CM | POA: Diagnosis not present

## 2023-08-27 DIAGNOSIS — F1411 Cocaine abuse, in remission: Secondary | ICD-10-CM | POA: Diagnosis not present

## 2023-08-27 LAB — BASIC METABOLIC PANEL
Anion gap: 8 (ref 5–15)
BUN: 42 mg/dL — ABNORMAL HIGH (ref 8–23)
CO2: 23 mmol/L (ref 22–32)
Calcium: 9 mg/dL (ref 8.9–10.3)
Chloride: 105 mmol/L (ref 98–111)
Creatinine, Ser: 1.41 mg/dL — ABNORMAL HIGH (ref 0.61–1.24)
GFR, Estimated: 55 mL/min — ABNORMAL LOW (ref >60)
Glucose, Bld: 83 mg/dL (ref 70–99)
Potassium: 5.3 mmol/L — ABNORMAL HIGH (ref 3.5–5.1)
Sodium: 136 mmol/L (ref 135–145)

## 2023-08-27 LAB — BRAIN NATRIURETIC PEPTIDE: B Natriuretic Peptide: 729.7 pg/mL — ABNORMAL HIGH (ref 0.0–100.0)

## 2023-08-27 MED ORDER — SPIRONOLACTONE 25 MG PO TABS: 25.0000 mg | ORAL_TABLET | Freq: Every day | ORAL | 0 refills | Status: DC

## 2023-08-27 NOTE — Progress Notes (Signed)
 [facility] Note   PCP: Kirstie Peri, MD Nephrology: Dr. Wolfgang Phoenix  HF Cardiology: Dr. Shirlee Latch   CC: HF follow up  HPI: 68 y.o. male w/ h/o HCV s/p liver transplant in 2009 (followed at [facility]), Stage III CKD, HTN and T2DM.   He was admitted 7/21 w/ NSTEMI and Afib w/ RVR, found to have severe 3VD and biventricular heart failure, LVEF 20-25%, RV moderately reduced. Underwent CABG + MAZE + LA appendage clipping 12/19/19. Required milrinone post operatively. Post op course complicated by ATN/ renal failure. Required CVVHD but remained anuric. Tunneled HD cath placed and transitioned to iHD, MWF.  He was able to transition off HD as an outpatient.   Repeat echo done in 10/21 with EF 20-25%, moderate LV dilation, mild-moderately decreased RV systolic function, moderate MR.   Echo in 4/22 showed EF 30%, mid anterior and inferior severe hypokinesis, basal to mid anterolateral and inferolateral akinesis, mildly decreased RV systolic function.   Admitted to [facility] 05/09/22-05/14/22 with AKI and acute ischemic liver injury in setting of cocaine use. Suspected ischemic injury 2/2 hypoperfusion from coronary vasospasm. AST/ALT peaked to 4,000s and Scr up to 8.1. He required CRRT briefly (12/05-12/06). Scr improved to 2.3 (near baseline).  Echo during this admit with EF 20%, mildly reduced RV, mild AI, mild MR, mild TR. GDMT held during admit but resumed at discharge.  Post hospital follow up 12/23, he was again off all GDMT. NYHA I-II and volume stable.  Admitted 1/24 at [facility] with unintentional opiate overdose (cocaine was contaminated). He was placed on Narcan gtt, and treated for multifocal PNA and a/c CHF. Required IV Lasix for diuresis. Continued on all HF meds at d/c.  He does have anemia of chronic disease and is getting scheduled Procrit injections.   Echo 11/24 EF 25-30% with moderate LV dilation, akinesis of inferolateral/inferior/anterolateral walls, severe LAE, mild MR,  normal RV size with mildly decreased systolic function.   Today he returns for HF follow up. No significant exertional dyspnea or chest pain.  He works as a Systems analyst at [employer].  Feeling good overall.  No orthopnea/PND.  No lightheadedness or palpitations.  He had recent arthroscopic knee surgery and will soon need the other knee operated upon.    Labs (9/21): LDL 66, HDL 44 Labs (10/21): K 3.9, creatinine 2.02 Labs (12/21): K 3.7, creatinine 1.58 Labs (9/21): LDL 66 Labs (1/22): K 4.1, creatinine 1.6 Labs (3/22): K 4.6, creatinine 1.56 => 2.09 Labs (4/22): LDL 66 Labs (8/22): K 5.5, creatinine 1.92 Labs (2/23): K 6.2, creatinine 2.36 Labs (6/23): K 5.2, creatinine 2.12 Labs (12/23): K 5.0, creatinine 2.15 Labs (1/24): K 3.9, creatinine 1.9 Labs (11/24): K 4.9, creatinine 1.4, LDL 90 Labs (12/24): K 4.6, creatinine 1.52  Review of systems complete and found to be negative unless listed in HPI.    PMH: 1. CKD stage III: He was on HD after CABG in 7/21 but renal function recovered.  2. HCV with cirrhosis, s/p liver transplant in 2009.  3. HTN 4. Psoriasis 5. Type 2 diabetes 6. Hyperlipidemia 7. CAD: NSTEMI 7/21.  - CABG (7/21) with LIMA-LAD, RIMA-ramus, sequential SVG-PDA and PLV, SVG -D 8. Atrial fibrillation: Paroxysmal.  - S/p Maze and LA appendage clip with CABG in 7/21.  9. Chronic systolic CHF: Ischemic cardiomyopathy.  - Echo (10/21): EF 20-25%, moderate LV dilation, mild-moderately decreased RV systolic function, moderate MR.  - Echo (4/22): EF 30%, mid anterior and inferior severe hypokinesis, basal to mid  anterolateral and inferolateral akinesis, mildly decreased RV systolic function. - Echo (12/23) at [facility]: EF 20%, LV RWMA, mildly reduced RV, mild MR, mild AI, mild TR - Echo (11/24): EF 25-30% with moderate LV dilation, akinesis of inferolateral/inferior/anterolateral walls, severe LAE, mild MR, normal RV size with mildly decreased systolic function. 10. Cocaine  abuse: Has quit.  Current Outpatient Medications  Medication Sig Dispense Refill   apixaban (ELIQUIS) 5 MG TABS tablet Take 1 tablet (5 mg total) by mouth 2 (two) times daily. 60 tablet 3   atorvastatin (LIPITOR) 80 MG tablet Take 1 tablet (80 mg total) by mouth daily. 90 tablet 3   carvedilol (COREG) 12.5 MG tablet Take 1 tablet (12.5 mg total) by mouth 2 (two) times daily with a meal. Please schedule an appointment for further refills 180 tablet 3   dapagliflozin propanediol (FARXIGA) 10 MG TABS tablet TAKE 1 TABLET BY MOUTH ONCE DAILY BEFORE BREAKFAST 90 tablet 3   ENTRESTO 24-26 MG TAKE 1 TABLET BY MOUTH TWICE DAILY *APPOINTMENT  REQUIRED  FOR  FUTURE  REFILLS* 180 tablet 0   ergocalciferol (VITAMIN D2) 1.25 MG (50000 UT) capsule Take 50,000 Units by mouth once a week.     ferrous sulfate 325 (65 FE) MG EC tablet Take 325 mg by mouth 2 (two) times daily.     FLUoxetine (PROZAC) 20 MG capsule Take 20 mg by mouth in the morning.     LORazepam (ATIVAN) 1 MG tablet Take 1 mg by mouth 2 (two) times daily as needed (panic attacks).      meloxicam (MOBIC) 15 MG tablet Take 15 mg by mouth daily.     multivitamin (ONE-A-DAY MEN'S) TABS tablet Take 1 tablet by mouth in the morning.     ondansetron (ZOFRAN-ODT) 4 MG disintegrating tablet Take 1 tablet (4 mg total) by mouth every 8 (eight) hours as needed for nausea or vomiting. 20 tablet 0   tacrolimus (PROGRAF) 1 MG capsule Take 1 mg by mouth 2 (two) times daily.     triamcinolone cream (KENALOG) 0.1 % Apply 1 application  topically 2 (two) times daily as needed (skin irritation.).     vitamin B-12 (CYANOCOBALAMIN) 500 MCG tablet Take 500 mcg by mouth in the morning.     Apremilast (OTEZLA) 30 MG TABS Take 30 mg by mouth 2 (two) times daily. (Patient not taking: Reported on 08/27/2023)     spironolactone (ALDACTONE) 25 MG tablet Take 1 tablet (25 mg total) by mouth daily. 90 tablet 0   No current facility-administered medications for this encounter.    Allergies  Allergen Reactions   Morphine And Codeine Nausea And Vomiting   Social History   Socioeconomic History   Marital status: Widowed    Spouse name: Not on file   Number of children: 1   Years of education: Not on file   Highest education level: Not on file  Occupational History   Not on file  Tobacco Use   Smoking status: Former    Current packs/day: 0.00    Types: Cigarettes    Quit date: 09/04/2007    Years since quitting: 15.9   Smokeless tobacco: Never  Vaping Use   Vaping status: Never Used  Substance and Sexual Activity   Alcohol use: No   Drug use: No   Sexual activity: Yes    Birth control/protection: None  Other Topics Concern   Not on file  Social History Narrative   Not on file   Social Drivers of Health  Financial Resource Strain: Low Risk  (03/23/2022)   Received from [facility], [facility]   Overall Financial Resource Strain (CARDIA)    Difficulty of Paying Living Expenses: Not hard at all  Food Insecurity: No Food Insecurity (03/23/2022)   Received from [facility], [facility]   Hunger Vital Sign    Worried About Running Out of Food in the Last Year: Never true    Ran Out of Food in the Last Year: Never true  Transportation Needs: No Transportation Needs (03/23/2022)   Received from [facility] [address] [facility], [facility]   [facility] - Transportation    Lack of Transportation (Medical): No    Lack of Transportation (Non-Medical): No  Physical Activity: Unknown (06/15/2022)   Received from [facility]   Exercise Vital Sign    Days of Exercise per Week: 7 days    Minutes of Exercise per Session: Not on file  Stress: No Stress Concern Present (03/23/2022)   Received from [facility], [facility] - Occupational Stress Questionnaire    Feeling of Stress : Not at all  Social Connections: Moderately Integrated (03/23/2022)   Received from [facility], [facility]    Social Connection and Isolation Panel [NHANES]    Frequency of Communication with Friends and Family: More than three times a week    Frequency of Social Gatherings with Friends and Family: More than three times a week    Attends Religious Services: More than 4 times per year    Active Member of [employer] or Organizations: Yes    Attends Banker Meetings: More than 4 times per year    Marital Status: Widowed  Intimate Partner Violence: Not At Risk (03/23/2022)   Received from [facility]   Humiliation, Afraid, Rape, and Kick questionnaire    Fear of Current or Ex-Partner: No    Emotionally Abused: No    Physically Abused: No    Sexually Abused: No   Family History  Problem Relation Age of Onset   Diabetes Mother    Heart disease Father    Heart disease Son    BP 110/68   Pulse (!) 52   SpO2 98%   Wt Readings from Last 3 Encounters:  06/04/23 75.2 kg (165 lb 12.8 oz)  05/25/23 75.8 kg (167 lb)  05/23/23 75.8 kg (167 lb)   PHYSICAL EXAM: General: NAD Neck: No JVD, no thyromegaly or thyroid nodule.  Lungs: Clear to auscultation bilaterally with normal respiratory effort. CV: Nondisplaced PMI.  Heart regular S1/S2, no S3/S4, no murmur.  No peripheral edema.  No carotid bruit.  Normal pedal pulses.  Abdomen: Soft, nontender, no hepatosplenomegaly, no distention.  Skin: Intact without lesions or rashes.  Neurologic: Alert and oriented x 3.  Psych: Normal affect. Extremities: No clubbing or cyanosis.  HEENT: Normal.   ASSESSMENT & PLAN: 1. CAD: NSTEMI in 7/21.  Suspect out of hospital inferolateral MI.  Cardiac MRI showed that the inferolateral wall was likely not viable, but the remainder of the LV myocardium was likely viable. He had CABG 12/19/19 with LIMA-LAD, RIMA-ramus, seq SVG-PD/PL, SVG-D.  No chest pain.   - No ASA given Eliquis use.  - Continue atorvastatin 80 mg daily.  Check lipids today. If LDL > 55, I will start him on Repatha.  2.  Chronic Systolic CHF: Ischemic cardiomyopathy.  Echo 7/21 with EF 20-25%, moderately decreased  RV systolic function. Cardiac MRI with LV EF 26%, RV EF 38%, near full thickness scar in inferolateral wall suggesting lack of viability in the LCx territory.  Now post-CABG.  Echo in 10/21 showed EF 20-25%, moderate LV dilation, mild-moderately decreased RV systolic function, moderate MR.  Echo in 4/22 showed EF 30%, mid anterior and inferior severe hypokinesis, basal to mid anterolateral and inferolateral akinesis, mildly decreased RV systolic function.  Echo at [facility] 12/23 with EF 20%, regional WMA in LV, mildly reduced RV. Echo 11/24 showed EF 25-30% with moderate LV dilation, akinesis of inferolateral/inferior/anterolateral walls, severe LAE, mild MR, normal RV size with mildly decreased systolic function. NYHA class II symptoms, not volume overloaded on exam.  - He is not taking Lasix. - Continue Entresto 24/26 mg bid.  - Increase spironolactone to 25 mg daily.  He has had trouble with mildly elevated K in the past and may require Lokelma.  BMET/BNP today, BMET 1 week.  - Continue Coreg 12.5 mg bid.  - Continue Farxiga 10 mg daily.  - He has decided that he does not want an ICD, reiterates this today.   - Recommended he continue to abstain from cocaine use. 3. Atrial fibrillation:  S/p Maze + LAA clipping on 7/16.  He is off amiodarone. NSR today.  - Continue Eliquis 5 mg bid.  4. CKD: Stage 3.  He required HD after CABG but renal function recovered. He is followed by nephrology - Continue Marcelline Deist. 5. Liver transplant for HCV: [facility]. On Tacrolimus.  6. Cocaine abuse: He has quit since 1/24.  7. Anemia of Chronic Disease:  Gets Procrit.   Follow up in 4 months with APP.   I spent 32 minutes reviewing data, interviewing patient and his family, and organizing the orders/followup.  Marca Ancona, MD 08/27/23

## 2023-08-27 NOTE — Patient Instructions (Signed)
 INCREASE Spironolactone to 25 mg daily.  Labs done today, your results will be available in MyChart, we will contact you for abnormal readings.  Repeat blood work next week either at a [facility] or at [facility].  Your physician recommends that you schedule a follow-up appointment in: 4 months.  If you have any questions or concerns before your next appointment please send [place] a message through [place] or call our office at [phone].    TO LEAVE A MESSAGE FOR THE NURSE SELECT OPTION 2, PLEASE LEAVE A MESSAGE INCLUDING: YOUR NAME DATE OF BIRTH CALL BACK NUMBER REASON FOR CALL**this is important as we prioritize the call backs  YOU WILL RECEIVE A CALL BACK THE SAME DAY AS LONG AS YOU CALL BEFORE 4:00 PM  At the [facility], you and your health needs are our priority. As part of our continuing mission to provide you with exceptional heart care, we have created designated Provider Care Teams. These Care Teams include your primary Cardiologist (physician) and Advanced Practice Providers (APPs- Physician Assistants and Nurse Practitioners) who all work together to provide you with the care you need, when you need it.   You may see any of the following providers on your designated Care Team at your next follow up: Dr Arvilla Meres Dr Marca Ancona Dr. Dorthula Nettles Dr. Clearnce Hasten Amy Filbert Schilder, NP Robbie Lis, [place] [facility] [place], [place] Brynda Peon, NP [place] Lee, NP Clarisa Kindred, NP Karle Plumber, PharmD Enos Fling, PharmD   Please be sure to bring in all your medications bottles to every appointment.    Thank you for choosing [facility]

## 2023-08-28 ENCOUNTER — Telehealth (HOSPITAL_COMMUNITY): Payer: Self-pay

## 2023-08-28 NOTE — Telephone Encounter (Signed)
 Advanced Heart Failure Patient Advocate Encounter  The patient was approved for a [employer] grant that will help cover the cost of Carvedilol, Entresto, Farxiga, Spironolactone.  Total amount awarded, $10,000.  Effective: 07/31/2023 - 07/29/2024.  BIN [id number] PCN [id number] Group [id number] ID [id number]  Pharmacy provided with approval and processing information. Patient informed via phone. Of note, this grant is requiring diagnosis verification. I will submit documentation to [employer] within the 30 day window.  Burnell Blanks, CPhT Rx Patient Advocate Phone: [phone]

## 2023-08-29 NOTE — Telephone Encounter (Signed)
 Diagnosis verification form uploaded to [employer]

## 2023-08-30 ENCOUNTER — Telehealth (HOSPITAL_COMMUNITY): Payer: Self-pay | Admitting: Cardiology

## 2023-08-30 MED ORDER — LOKELMA 10 G PO PACK: 10.0000 g | PACK | Freq: Every day | ORAL | 3 refills | Status: DC

## 2023-08-30 NOTE — Telephone Encounter (Signed)
 Patient called.  Patient aware of 3/24 labs results

## 2023-08-31 DIAGNOSIS — M25532 Pain in left wrist: Secondary | ICD-10-CM | POA: Diagnosis not present

## 2023-08-31 DIAGNOSIS — M25562 Pain in left knee: Secondary | ICD-10-CM | POA: Diagnosis not present

## 2023-09-06 DIAGNOSIS — M1712 Unilateral primary osteoarthritis, left knee: Secondary | ICD-10-CM | POA: Diagnosis not present

## 2023-09-10 ENCOUNTER — Other Ambulatory Visit (HOSPITAL_COMMUNITY)
Admission: RE | Admit: 2023-09-10 | Discharge: 2023-09-10 | Disposition: A | Discharge status: Home / Self Care | Source: Ambulatory Visit | Attending: Nephrology | Admitting: Nephrology

## 2023-09-10 ENCOUNTER — Other Ambulatory Visit (HOSPITAL_COMMUNITY)
Admission: RE | Admit: 2023-09-10 | Discharge: 2023-09-10 | Disposition: A | Discharge status: Home / Self Care | Source: Ambulatory Visit | Attending: Cardiology | Admitting: Cardiology

## 2023-09-10 DIAGNOSIS — R809 Proteinuria, unspecified: Secondary | ICD-10-CM | POA: Insufficient documentation

## 2023-09-10 DIAGNOSIS — D631 Anemia in chronic kidney disease: Secondary | ICD-10-CM | POA: Diagnosis not present

## 2023-09-10 DIAGNOSIS — I5022 Chronic systolic (congestive) heart failure: Secondary | ICD-10-CM | POA: Insufficient documentation

## 2023-09-10 DIAGNOSIS — N189 Chronic kidney disease, unspecified: Secondary | ICD-10-CM | POA: Insufficient documentation

## 2023-09-10 LAB — BASIC METABOLIC PANEL WITH GFR
Anion gap: 9 (ref 5–15)
BUN: 32 mg/dL — ABNORMAL HIGH (ref 8–23)
CO2: 21 mmol/L — ABNORMAL LOW (ref 22–32)
Calcium: 8.4 mg/dL — ABNORMAL LOW (ref 8.9–10.3)
Chloride: 99 mmol/L (ref 98–111)
Creatinine, Ser: 1.44 mg/dL — ABNORMAL HIGH (ref 0.61–1.24)
GFR, Estimated: 54 mL/min — ABNORMAL LOW (ref >60)
Glucose, Bld: 117 mg/dL — ABNORMAL HIGH (ref 70–99)
Potassium: 4.8 mmol/L (ref 3.5–5.1)
Sodium: 129 mmol/L — ABNORMAL LOW (ref 135–145)

## 2023-09-10 LAB — PROTEIN / CREATININE RATIO, URINE
Creatinine, Urine: 92 mg/dL
Protein Creatinine Ratio: 0.21 mg/mg{creat} — ABNORMAL HIGH (ref 0.00–0.15)
Total Protein, Urine: 19 mg/dL

## 2023-09-10 LAB — RENAL FUNCTION PANEL
Albumin: 3.3 g/dL — ABNORMAL LOW (ref 3.5–5.0)
Anion gap: 9 (ref 5–15)
BUN: 33 mg/dL — ABNORMAL HIGH (ref 8–23)
CO2: 21 mmol/L — ABNORMAL LOW (ref 22–32)
Calcium: 8.4 mg/dL — ABNORMAL LOW (ref 8.9–10.3)
Chloride: 99 mmol/L (ref 98–111)
Creatinine, Ser: 1.36 mg/dL — ABNORMAL HIGH (ref 0.61–1.24)
GFR, Estimated: 57 mL/min — ABNORMAL LOW (ref >60)
Glucose, Bld: 118 mg/dL — ABNORMAL HIGH (ref 70–99)
Phosphorus: 3.7 mg/dL (ref 2.5–4.6)
Potassium: 4.8 mmol/L (ref 3.5–5.1)
Sodium: 129 mmol/L — ABNORMAL LOW (ref 135–145)

## 2023-09-10 LAB — CBC
HCT: 42.3 % (ref 39.0–52.0)
Hemoglobin: 14 g/dL (ref 13.0–17.0)
MCH: 29.1 pg (ref 26.0–34.0)
MCHC: 33.1 g/dL (ref 30.0–36.0)
MCV: 87.9 fL (ref 80.0–100.0)
Platelets: 212 10*3/uL (ref 150–400)
RBC: 4.81 MIL/uL (ref 4.22–5.81)
RDW: 16 % — ABNORMAL HIGH (ref 11.5–15.5)
WBC: 7.8 10*3/uL (ref 4.0–10.5)
nRBC: 0 % (ref 0.0–0.2)

## 2023-09-24 DIAGNOSIS — Z299 Encounter for prophylactic measures, unspecified: Secondary | ICD-10-CM | POA: Diagnosis not present

## 2023-09-24 DIAGNOSIS — I10 Essential (primary) hypertension: Secondary | ICD-10-CM | POA: Diagnosis not present

## 2023-09-24 DIAGNOSIS — R52 Pain, unspecified: Secondary | ICD-10-CM | POA: Diagnosis not present

## 2023-09-24 DIAGNOSIS — I509 Heart failure, unspecified: Secondary | ICD-10-CM | POA: Diagnosis not present

## 2023-09-24 DIAGNOSIS — D849 Immunodeficiency, unspecified: Secondary | ICD-10-CM | POA: Diagnosis not present

## 2023-09-24 DIAGNOSIS — L4050 Arthropathic psoriasis, unspecified: Secondary | ICD-10-CM | POA: Diagnosis not present

## 2023-09-24 DIAGNOSIS — M7989 Other specified soft tissue disorders: Secondary | ICD-10-CM | POA: Diagnosis not present

## 2023-09-24 DIAGNOSIS — N1832 Chronic kidney disease, stage 3b: Secondary | ICD-10-CM | POA: Diagnosis not present

## 2023-09-25 ENCOUNTER — Encounter (HOSPITAL_COMMUNITY): Payer: Self-pay | Admitting: Cardiology

## 2023-10-01 ENCOUNTER — Other Ambulatory Visit (HOSPITAL_COMMUNITY): Payer: Self-pay

## 2023-10-04 DIAGNOSIS — Z9889 Other specified postprocedural states: Secondary | ICD-10-CM | POA: Diagnosis not present

## 2023-10-11 DIAGNOSIS — M7711 Lateral epicondylitis, right elbow: Secondary | ICD-10-CM | POA: Diagnosis not present

## 2023-10-11 DIAGNOSIS — M531 Cervicobrachial syndrome: Secondary | ICD-10-CM | POA: Diagnosis not present

## 2023-10-11 DIAGNOSIS — M9901 Segmental and somatic dysfunction of cervical region: Secondary | ICD-10-CM | POA: Diagnosis not present

## 2023-10-11 DIAGNOSIS — M9907 Segmental and somatic dysfunction of upper extremity: Secondary | ICD-10-CM | POA: Diagnosis not present

## 2023-10-13 ENCOUNTER — Other Ambulatory Visit (HOSPITAL_COMMUNITY): Payer: Self-pay | Admitting: Cardiology

## 2023-10-16 ENCOUNTER — Encounter: Payer: Self-pay | Admitting: Cardiology

## 2023-10-17 DIAGNOSIS — M531 Cervicobrachial syndrome: Secondary | ICD-10-CM | POA: Diagnosis not present

## 2023-10-17 DIAGNOSIS — M9907 Segmental and somatic dysfunction of upper extremity: Secondary | ICD-10-CM | POA: Diagnosis not present

## 2023-10-17 DIAGNOSIS — M9901 Segmental and somatic dysfunction of cervical region: Secondary | ICD-10-CM | POA: Diagnosis not present

## 2023-10-17 DIAGNOSIS — M7711 Lateral epicondylitis, right elbow: Secondary | ICD-10-CM | POA: Diagnosis not present

## 2023-10-18 DIAGNOSIS — I5042 Chronic combined systolic (congestive) and diastolic (congestive) heart failure: Secondary | ICD-10-CM | POA: Diagnosis not present

## 2023-10-18 DIAGNOSIS — N2581 Secondary hyperparathyroidism of renal origin: Secondary | ICD-10-CM | POA: Diagnosis not present

## 2023-10-18 DIAGNOSIS — R809 Proteinuria, unspecified: Secondary | ICD-10-CM | POA: Diagnosis not present

## 2023-10-18 DIAGNOSIS — N1831 Chronic kidney disease, stage 3a: Secondary | ICD-10-CM | POA: Diagnosis not present

## 2023-10-19 ENCOUNTER — Other Ambulatory Visit (HOSPITAL_COMMUNITY): Payer: Self-pay | Admitting: Cardiology

## 2023-10-23 DIAGNOSIS — M531 Cervicobrachial syndrome: Secondary | ICD-10-CM | POA: Diagnosis not present

## 2023-10-23 DIAGNOSIS — M9901 Segmental and somatic dysfunction of cervical region: Secondary | ICD-10-CM | POA: Diagnosis not present

## 2023-10-23 DIAGNOSIS — M7711 Lateral epicondylitis, right elbow: Secondary | ICD-10-CM | POA: Diagnosis not present

## 2023-10-23 DIAGNOSIS — M9907 Segmental and somatic dysfunction of upper extremity: Secondary | ICD-10-CM | POA: Diagnosis not present

## 2023-10-24 DIAGNOSIS — L570 Actinic keratosis: Secondary | ICD-10-CM | POA: Diagnosis not present

## 2023-10-24 DIAGNOSIS — L400 Psoriasis vulgaris: Secondary | ICD-10-CM | POA: Diagnosis not present

## 2023-10-24 DIAGNOSIS — Z85828 Personal history of other malignant neoplasm of skin: Secondary | ICD-10-CM | POA: Diagnosis not present

## 2023-10-25 DIAGNOSIS — M9901 Segmental and somatic dysfunction of cervical region: Secondary | ICD-10-CM | POA: Diagnosis not present

## 2023-10-25 DIAGNOSIS — M9907 Segmental and somatic dysfunction of upper extremity: Secondary | ICD-10-CM | POA: Diagnosis not present

## 2023-10-25 DIAGNOSIS — M531 Cervicobrachial syndrome: Secondary | ICD-10-CM | POA: Diagnosis not present

## 2023-10-25 DIAGNOSIS — M7711 Lateral epicondylitis, right elbow: Secondary | ICD-10-CM | POA: Diagnosis not present

## 2023-11-01 DIAGNOSIS — M531 Cervicobrachial syndrome: Secondary | ICD-10-CM | POA: Diagnosis not present

## 2023-11-01 DIAGNOSIS — M7711 Lateral epicondylitis, right elbow: Secondary | ICD-10-CM | POA: Diagnosis not present

## 2023-11-01 DIAGNOSIS — M9901 Segmental and somatic dysfunction of cervical region: Secondary | ICD-10-CM | POA: Diagnosis not present

## 2023-11-01 DIAGNOSIS — M9907 Segmental and somatic dysfunction of upper extremity: Secondary | ICD-10-CM | POA: Diagnosis not present

## 2023-11-08 DIAGNOSIS — Z85828 Personal history of other malignant neoplasm of skin: Secondary | ICD-10-CM | POA: Diagnosis not present

## 2023-11-08 DIAGNOSIS — D225 Melanocytic nevi of trunk: Secondary | ICD-10-CM | POA: Diagnosis not present

## 2023-11-08 DIAGNOSIS — M19042 Primary osteoarthritis, left hand: Secondary | ICD-10-CM | POA: Diagnosis not present

## 2023-11-08 DIAGNOSIS — L578 Other skin changes due to chronic exposure to nonionizing radiation: Secondary | ICD-10-CM | POA: Diagnosis not present

## 2023-11-08 DIAGNOSIS — M19041 Primary osteoarthritis, right hand: Secondary | ICD-10-CM | POA: Diagnosis not present

## 2023-11-08 DIAGNOSIS — L4050 Arthropathic psoriasis, unspecified: Secondary | ICD-10-CM | POA: Diagnosis not present

## 2023-11-08 DIAGNOSIS — L409 Psoriasis, unspecified: Secondary | ICD-10-CM | POA: Diagnosis not present

## 2023-11-08 DIAGNOSIS — M25541 Pain in joints of right hand: Secondary | ICD-10-CM | POA: Diagnosis not present

## 2023-11-08 DIAGNOSIS — L570 Actinic keratosis: Secondary | ICD-10-CM | POA: Diagnosis not present

## 2023-11-08 DIAGNOSIS — M25542 Pain in joints of left hand: Secondary | ICD-10-CM | POA: Diagnosis not present

## 2023-11-08 DIAGNOSIS — D849 Immunodeficiency, unspecified: Secondary | ICD-10-CM | POA: Diagnosis not present

## 2023-11-08 DIAGNOSIS — Z79899 Other long term (current) drug therapy: Secondary | ICD-10-CM | POA: Diagnosis not present

## 2023-11-08 DIAGNOSIS — D2270 Melanocytic nevi of unspecified lower limb, including hip: Secondary | ICD-10-CM | POA: Diagnosis not present

## 2023-11-08 DIAGNOSIS — D2260 Melanocytic nevi of unspecified upper limb, including shoulder: Secondary | ICD-10-CM | POA: Diagnosis not present

## 2023-11-19 ENCOUNTER — Other Ambulatory Visit (HOSPITAL_COMMUNITY): Payer: Self-pay | Admitting: Cardiology

## 2023-11-26 DIAGNOSIS — I10 Essential (primary) hypertension: Secondary | ICD-10-CM | POA: Diagnosis not present

## 2023-11-26 DIAGNOSIS — L4050 Arthropathic psoriasis, unspecified: Secondary | ICD-10-CM | POA: Diagnosis not present

## 2023-11-26 DIAGNOSIS — M2550 Pain in unspecified joint: Secondary | ICD-10-CM | POA: Diagnosis not present

## 2023-11-26 DIAGNOSIS — F419 Anxiety disorder, unspecified: Secondary | ICD-10-CM | POA: Diagnosis not present

## 2023-11-26 DIAGNOSIS — I509 Heart failure, unspecified: Secondary | ICD-10-CM | POA: Diagnosis not present

## 2023-11-26 DIAGNOSIS — Z299 Encounter for prophylactic measures, unspecified: Secondary | ICD-10-CM | POA: Diagnosis not present

## 2023-11-29 DIAGNOSIS — K7460 Unspecified cirrhosis of liver: Secondary | ICD-10-CM | POA: Diagnosis not present

## 2023-12-12 ENCOUNTER — Other Ambulatory Visit: Payer: Self-pay | Admitting: Cardiology

## 2023-12-13 DIAGNOSIS — L4050 Arthropathic psoriasis, unspecified: Secondary | ICD-10-CM | POA: Diagnosis not present

## 2023-12-13 DIAGNOSIS — F339 Major depressive disorder, recurrent, unspecified: Secondary | ICD-10-CM | POA: Diagnosis not present

## 2023-12-13 DIAGNOSIS — R110 Nausea: Secondary | ICD-10-CM | POA: Diagnosis not present

## 2023-12-13 DIAGNOSIS — I509 Heart failure, unspecified: Secondary | ICD-10-CM | POA: Diagnosis not present

## 2023-12-26 DIAGNOSIS — N1832 Chronic kidney disease, stage 3b: Secondary | ICD-10-CM | POA: Diagnosis not present

## 2023-12-26 DIAGNOSIS — Z299 Encounter for prophylactic measures, unspecified: Secondary | ICD-10-CM | POA: Diagnosis not present

## 2023-12-26 DIAGNOSIS — K7460 Unspecified cirrhosis of liver: Secondary | ICD-10-CM | POA: Diagnosis not present

## 2023-12-26 DIAGNOSIS — R0989 Other specified symptoms and signs involving the circulatory and respiratory systems: Secondary | ICD-10-CM | POA: Diagnosis not present

## 2023-12-26 DIAGNOSIS — J069 Acute upper respiratory infection, unspecified: Secondary | ICD-10-CM | POA: Diagnosis not present

## 2023-12-26 DIAGNOSIS — R5383 Other fatigue: Secondary | ICD-10-CM | POA: Diagnosis not present

## 2023-12-26 DIAGNOSIS — I482 Chronic atrial fibrillation, unspecified: Secondary | ICD-10-CM | POA: Diagnosis not present

## 2023-12-27 ENCOUNTER — Telehealth (HOSPITAL_COMMUNITY): Payer: Self-pay | Admitting: Physician Assistant

## 2023-12-27 ENCOUNTER — Inpatient Hospital Stay (HOSPITAL_COMMUNITY)
Admission: RE | Admit: 2023-12-27 | Discharge: 2023-12-27 | Disposition: A | Discharge status: Home / Self Care | Source: Ambulatory Visit

## 2023-12-27 NOTE — Progress Notes (Incomplete)
 Advanced Heart Failure Clinic Note   PCP: Maree Isles, MD Nephrology: Dr. Rachele  HF Cardiology: Dr. Rolan   CC: CHF  HPI: 68 y.o. male w/ h/o HCV s/p liver transplant in 2009 (followed at [facility]), psoriatic arthritis, Stage III CKD, HTN and T2DM.   He was admitted 7/21 w/ NSTEMI and Afib w/ RVR, found to have severe 3VD and biventricular heart failure, LVEF 20-25%, RV moderately reduced. Underwent CABG + MAZE + LA appendage clipping 12/19/19. Required milrinone  post operatively. Post op course complicated by ATN/ renal failure. Required CVVHD but remained anuric. Tunneled HD cath placed and transitioned to iHD, MWF.  He was able to transition off HD as an outpatient.   Repeat echo done in 10/21 with EF 20-25%, moderate LV dilation, mild-moderately decreased RV systolic function, moderate MR.   Echo in 4/22 showed EF 30%, mid anterior and inferior severe hypokinesis, basal to mid anterolateral and inferolateral akinesis, mildly decreased RV systolic function.   Admitted to [facility] 05/09/22-05/14/22 with AKI and acute ischemic liver injury in setting of cocaine use. Suspected ischemic injury 2/2 hypoperfusion from coronary vasospasm. AST/ALT peaked to 4,000s and Scr up to 8.1. He required CRRT briefly. Scr improved to 2.3 (near baseline).  Echo during this admit with EF 20%, mildly reduced RV, mild AI, mild MR, mild TR. GDMT held during admit but resumed at discharge.  Post hospital follow up 12/23, he was again off all GDMT. NYHA I-II and volume stable.  Admitted 1/24 at [facility] with unintentional opiate overdose (cocaine was contaminated). He was placed on Narcan  gtt, and treated for multifocal PNA and a/c CHF. Required IV Lasix  for diuresis. Continued on all HF meds at d/c.  He does have anemia of chronic disease and is getting scheduled Procrit  injections.   Echo 11/24 EF 25-30% with moderate LV dilation, akinesis of inferolateral/inferior/anterolateral walls, severe LAE, mild MR,  normal RV size with mildly decreased systolic function.   He is here today for CHF follow-up.  Works as a Systems analyst at [employer].  He had recent arthroscopic knee surgery and will soon need the other knee operated upon.     Review of systems complete and found to be negative unless listed in HPI.    PMH: 1. CKD stage III: He was on HD after CABG in 7/21 but renal function recovered.  2. HCV with cirrhosis, s/p liver transplant in 2009.  3. HTN 4. Psoriasis 5. Type 2 diabetes 6. Hyperlipidemia 7. CAD: NSTEMI 7/21.  - CABG (7/21) with LIMA-LAD, RIMA-ramus, sequential SVG-PDA and PLV, SVG -D 8. Atrial fibrillation: Paroxysmal.  - S/p Maze and LA appendage clip with CABG in 7/21.  9. Chronic systolic CHF: Ischemic cardiomyopathy.  - Echo (10/21): EF 20-25%, moderate LV dilation, mild-moderately decreased RV systolic function, moderate MR.  - Echo (4/22): EF 30%, mid anterior and inferior severe hypokinesis, basal to mid anterolateral and inferolateral akinesis, mildly decreased RV systolic function. - Echo (12/23) at [facility]: EF 20%, LV RWMA, mildly reduced RV, mild MR, mild AI, mild TR - Echo (11/24): EF 25-30% with moderate LV dilation, akinesis of inferolateral/inferior/anterolateral walls, severe LAE, mild MR, normal RV size with mildly decreased systolic function. 10. Cocaine abuse: Has quit.  Current Outpatient Medications  Medication Sig Dispense Refill   apixaban  (ELIQUIS ) 5 MG TABS tablet Take 1 tablet by mouth twice daily 60 tablet 11   Apremilast  (OTEZLA ) 30 MG TABS Take 30 mg by mouth 2 (two) times daily. (Patient not taking: Reported on  08/27/2023)     atorvastatin  (LIPITOR ) 80 MG tablet Take 1 tablet (80 mg total) by mouth daily. 90 tablet 3   carvedilol  (COREG ) 12.5 MG tablet Take 1 tablet (12.5 mg total) by mouth 2 (two) times daily with a meal. Please schedule an appointment for further refills 180 tablet 3   ENTRESTO  24-26 MG Take 1 tablet by mouth twice daily 180  tablet 0   ergocalciferol (VITAMIN D2) 1.25 MG (50000 UT) capsule Take 50,000 Units by mouth once a week.     FARXIGA  10 MG TABS tablet TAKE 1 TABLET BY MOUTH ONCE DAILY BEFORE BREAKFAST 90 tablet 0   ferrous sulfate 325 (65 FE) MG EC tablet Take 325 mg by mouth 2 (two) times daily.     FLUoxetine  (PROZAC ) 20 MG capsule Take 20 mg by mouth in the morning.     LORazepam  (ATIVAN ) 1 MG tablet Take 1 mg by mouth 2 (two) times daily as needed (panic attacks).      meloxicam (MOBIC) 15 MG tablet Take 15 mg by mouth daily.     multivitamin (ONE-A-DAY MEN'S) TABS tablet Take 1 tablet by mouth in the morning.     ondansetron  (ZOFRAN -ODT) 4 MG disintegrating tablet Take 1 tablet (4 mg total) by mouth every 8 (eight) hours as needed for nausea or vomiting. 20 tablet 0   sodium zirconium cyclosilicate  (LOKELMA ) 10 g PACK packet Take 10 g by mouth daily. 30 packet 3   spironolactone  (ALDACTONE ) 25 MG tablet Take 1 tablet by mouth once daily 90 tablet 0   tacrolimus  (PROGRAF ) 1 MG capsule Take 1 mg by mouth 2 (two) times daily.     triamcinolone  cream (KENALOG) 0.1 % Apply 1 application  topically 2 (two) times daily as needed (skin irritation.).     vitamin B-12 (CYANOCOBALAMIN) 500 MCG tablet Take 500 mcg by mouth in the morning.     No current facility-administered medications for this encounter.   Allergies  Allergen Reactions   Morphine  And Codeine Nausea And Vomiting   Social History   Socioeconomic History   Marital status: Widowed    Spouse name: Not on file   Number of children: 1   Years of education: Not on file   Highest education level: Not on file  Occupational History   Not on file  Tobacco Use   Smoking status: Former    Current packs/day: 0.00    Types: Cigarettes    Quit date: 09/04/2007    Years since quitting: 16.3   Smokeless tobacco: Never  Vaping Use   Vaping status: Never Used  Substance and Sexual Activity   Alcohol  use: No   Drug use: No   Sexual activity: Yes     Birth control/protection: None  Other Topics Concern   Not on file  Social History Narrative   Not on file   Social Drivers of Health   Financial Resource Strain: Low Risk  (03/23/2022)   Received from [facility]   Overall Financial Resource Strain (CARDIA)    Difficulty of Paying Living Expenses: Not hard at all  Food Insecurity: No Food Insecurity (03/23/2022)   Received from [facility]   Hunger Vital Sign    Within the past 12 months, you worried that your food would run out before you got the money to buy more.: Never true    Within the past 12 months, the food you bought just didn't last and you didn't have money to get more.: Never  true  Transportation Needs: No Transportation Needs (03/23/2022)   Received from [facility] - Transportation    Lack of Transportation (Medical): No    Lack of Transportation (Non-Medical): No  Physical Activity: Unknown (06/15/2022)   Received from [facility]   Exercise Vital Sign    On average, how many days per week do you engage in moderate to strenuous exercise (like a brisk walk)?: 7 days    Minutes of Exercise per Session: Not on file  Stress: No Stress Concern Present (03/23/2022)   Received from [facility] - Occupational Stress Questionnaire    Feeling of Stress : Not at all  Social Connections: Moderately Integrated (03/23/2022)   Received from [facility]   Social Connection and Isolation Panel    In a typical week, how many times do you talk on the phone with family, friends, or neighbors?: More than three times a week    How often do you get together with friends or relatives?: More than three times a week    How often do you attend church or religious services?: More than 4 times per year    Do you belong to any clubs or organizations such as church groups, unions, fraternal or athletic groups, or school groups?: Yes    How often do you attend meetings  of the clubs or organizations you belong to?: More than 4 times per year    Are you married, widowed, divorced, separated, never married, or living with a partner?: Widowed  Intimate Partner Violence: Not At Risk (03/23/2022)   Received from [facility]   Humiliation, Afraid, Rape, and Kick questionnaire    Within the last year, have you been afraid of your partner or ex-partner?: No    Within the last year, have you been humiliated or emotionally abused in other ways by your partner or ex-partner?: No    Within the last year, have you been kicked, hit, slapped, or otherwise physically hurt by your partner or ex-partner?: No    Within the last year, have you been raped or forced to have any kind of sexual activity by your partner or ex-partner?: No   Family History  Problem Relation Age of Onset   Diabetes Mother    Heart disease Father    Heart disease Son    There were no vitals taken for this visit.  Wt Readings from Last 3 Encounters:  06/04/23 75.2 kg (165 lb 12.8 oz)  05/25/23 75.8 kg (167 lb)  05/23/23 75.8 kg (167 lb)   PHYSICAL EXAM: General: NAD Neck: No JVD, no thyromegaly or thyroid  nodule.  Lungs: Clear to auscultation bilaterally with normal respiratory effort. CV: Nondisplaced PMI.  Heart regular S1/S2, no S3/S4, no murmur.  No peripheral edema.  No carotid bruit.  Normal pedal pulses.  Abdomen: Soft, nontender, no hepatosplenomegaly, no distention.  Skin: Intact without lesions or rashes.  Neurologic: Alert and oriented x 3.  Psych: Normal affect. Extremities: No clubbing or cyanosis.  HEENT: Normal.   ASSESSMENT & PLAN: 1. CAD: NSTEMI in 7/21.  Suspect out of hospital inferolateral MI.  Cardiac MRI showed that the inferolateral wall was likely not viable, but the remainder of the LV myocardium was likely viable. He had CABG 12/19/19 with LIMA-LAD, RIMA-ramus, seq SVG-PD/PL, SVG-D.  No chest pain.   - No ASA given Eliquis  use.  - Continue atorvastatin  80 mg  daily.  Check  lipid panel today. If LDL > 55, will refer him to Pharmacy for [facility] I.  2. Chronic Systolic CHF: Ischemic cardiomyopathy.  Echo 7/21 with EF 20-25%, moderately decreased RV systolic function. Cardiac MRI with LV EF 26%, RV EF 38%, near full thickness scar in inferolateral wall suggesting lack of viability in the LCx territory.  Now post-CABG.  Echo in 10/21 showed EF 20-25%, moderate LV dilation, mild-moderately decreased RV systolic function, moderate MR.  Echo in 4/22 showed EF 30%, mid anterior and inferior severe hypokinesis, basal to mid anterolateral and inferolateral akinesis, mildly decreased RV systolic function.  Echo at [facility] 12/23 with EF 20%, regional WMA in LV, mildly reduced RV. Echo 11/24 showed EF 25-30% with moderate LV dilation, akinesis of inferolateral/inferior/anterolateral walls, severe LAE, mild MR, normal RV size with mildly decreased systolic function. NYHA ***. Volume *** - He is not taking Lasix . - Continue Entresto  24/26 mg bid.  - Continue spiro 25 mg daily - Continue Coreg  12.5 mg bid.  - Continue Farxiga  10 mg daily.  - He has decided that he does not want an ICD, reiterates this today.   - Recommended he continue to abstain from cocaine use. 3. Atrial fibrillation:  S/p Maze + LAA clipping on 7/16.  He is off amiodarone . NSR today.  - Continue Eliquis  5 mg bid.  4. CKD: Stage 3.  He required HD after CABG but renal function recovered. He is followed by nephrology - Continue Farxiga . 5. Liver transplant for HCV: Followed at [facility]. On Tacrolimus .  6. Cocaine abuse: He has quit since 1/24.  7. Anemia of Chronic Disease:  Gets Procrit .   Follow up ***  I spent *** minutes reviewing data, interviewing patient and his family, and organizing the orders/followup.  Joseph Greer N, PA-C 12/27/23

## 2024-01-02 NOTE — Progress Notes (Signed)
 Advanced Heart Failure Clinic Note   PCP: Maree Isles, MD Nephrology: Dr. Rachele  HF Cardiology: Dr. Rolan   HPI: 68 y.o. male w/ h/o HCV s/p liver transplant in 2009 (followed at [facility]), psoriatic arthritis, Stage III CKD, HTN and T2DM.   He was admitted 7/21 w/ NSTEMI and Afib w/ RVR, found to have severe 3VD and biventricular heart failure, LVEF 20-25%, RV moderately reduced. Underwent CABG + MAZE + LA appendage clipping 12/19/19. Required milrinone  post operatively. Post op course complicated by ATN/ renal failure. Required CVVHD but remained anuric. Tunneled HD cath placed and transitioned to iHD, MWF.  He was able to transition off HD as an outpatient.   Repeat echo done in 10/21 with EF 20-25%, moderate LV dilation, mild-moderately decreased RV systolic function, moderate MR.   Echo in 4/22 showed EF 30%, mid anterior and inferior severe hypokinesis, basal to mid anterolateral and inferolateral akinesis, mildly decreased RV systolic function.   Admitted to [facility] 05/09/22-05/14/22 with AKI and acute ischemic liver injury in setting of cocaine use. Suspected ischemic injury 2/2 hypoperfusion from coronary vasospasm. AST/ALT peaked to 4,000s and Scr up to 8.1. He required CRRT briefly. Scr improved to 2.3 (near baseline).  Echo during this admit with EF 20%, mildly reduced RV, mild AI, mild MR, mild TR. GDMT held during admit but resumed at discharge.  Post hospital follow up 12/23, he was again off all GDMT. NYHA I-II and volume stable.  Admitted 1/24 at [facility] with unintentional opiate overdose (cocaine was contaminated). He was placed on Narcan  gtt, and treated for multifocal PNA and a/c CHF. Required IV Lasix  for diuresis. Continued on all HF meds at d/c.  He does have anemia of chronic disease and is getting scheduled Procrit  injections.   Echo 11/24 EF 25-30% with moderate LV dilation, akinesis of inferolateral/inferior/anterolateral walls, severe LAE, mild MR, normal RV size  with mildly decreased systolic function.   Today he returns for HF follow up. Overall feeling fine. Breathing well, doing cardio 3x/week. 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 Having some fatigue, this is on-going. No longer receiving Procrit  injections. Denies palpitations, abnormal bleeding, CP, dizziness, edema, or PND/Orthopnea.  Weight at home 164 pounds. Taking all medications. Rare ETOH, no tobacco or drug use.   Labs (11/24): K 4.9, creatinine 1.4, LDL 90 Labs (12/24): K 4.6, creatinine 1.52  PMH: 1. CKD stage III: He was on HD after CABG in 7/21 but renal function recovered.  2. HCV with cirrhosis, s/p liver transplant in 2009.  3. HTN 4. Psoriasis 5. Type 2 diabetes 6. Hyperlipidemia 7. CAD: NSTEMI 7/21.  - CABG (7/21) with LIMA-LAD, RIMA-ramus, sequential SVG-PDA and PLV, SVG -D 8. Atrial fibrillation: Paroxysmal.  - S/p Maze and LA appendage clip with CABG in 7/21.  9. Chronic systolic CHF: Ischemic cardiomyopathy.  - Echo (10/21): EF 20-25%, moderate LV dilation, mild-moderately decreased RV systolic function, moderate MR.  - Echo (4/22): EF 30%, mid anterior and inferior severe hypokinesis, basal to mid anterolateral and inferolateral akinesis, mildly decreased RV systolic function. - Echo (12/23) at [facility]: EF 20%, LV RWMA, mildly reduced RV, mild MR, mild AI, mild TR - Echo (11/24): EF 25-30% with moderate LV dilation, akinesis of inferolateral/inferior/anterolateral walls, severe LAE, mild MR, normal RV size with mildly decreased systolic function. 10. Cocaine abuse: Has quit.  Review of systems complete and found to be negative unless listed in HPI.    PMH: 1. CKD stage III: He was on HD  after CABG in 7/21 but renal function recovered.  2. HCV with cirrhosis, s/p liver transplant in 2009.  3. HTN 4. Psoriasis 5. Type 2 diabetes 6. Hyperlipidemia 7. CAD: NSTEMI 7/21.  - CABG (7/21) with LIMA-LAD, RIMA-ramus, sequential SVG-PDA and PLV, SVG -D 8. Atrial  fibrillation: Paroxysmal.  - S/p Maze and LA appendage clip with CABG in 7/21.  9. Chronic systolic CHF: Ischemic cardiomyopathy.  - Echo (10/21): EF 20-25%, moderate LV dilation, mild-moderately decreased RV systolic function, moderate MR.  - Echo (4/22): EF 30%, mid anterior and inferior severe hypokinesis, basal to mid anterolateral and inferolateral akinesis, mildly decreased RV systolic function. - Echo (12/23) at [facility]: EF 20%, LV RWMA, mildly reduced RV, mild MR, mild AI, mild TR - Echo (11/24): EF 25-30% with moderate LV dilation, akinesis of inferolateral/inferior/anterolateral walls, severe LAE, mild MR, normal RV size with mildly decreased systolic function. 10. Cocaine abuse: Has quit.  Current Outpatient Medications  Medication Sig Dispense Refill   apixaban  (ELIQUIS ) 5 MG TABS tablet Take 1 tablet by mouth twice daily 60 tablet 11   Apremilast  (OTEZLA ) 30 MG TABS Take 30 mg by mouth 2 (two) times daily.     atorvastatin  (LIPITOR ) 80 MG tablet Take 1 tablet (80 mg total) by mouth daily. 90 tablet 3   cariprazine (VRAYLAR) 1.5 MG capsule Take 1.5 mg by mouth daily.     carvedilol  (COREG ) 12.5 MG tablet Take 1 tablet (12.5 mg total) by mouth 2 (two) times daily with a meal. Please schedule an appointment for further refills 180 tablet 3   ENTRESTO  24-26 MG Take 1 tablet by mouth twice daily 180 tablet 0   ergocalciferol (VITAMIN D2) 1.25 MG (50000 UT) capsule Take 50,000 Units by mouth once a week.     FARXIGA  10 MG TABS tablet TAKE 1 TABLET BY MOUTH ONCE DAILY BEFORE BREAKFAST 90 tablet 0   ferrous sulfate 325 (65 FE) MG EC tablet Take 325 mg by mouth 2 (two) times daily.     FLUoxetine  (PROZAC ) 20 MG capsule Take 20 mg by mouth in the morning.     LORazepam  (ATIVAN ) 1 MG tablet Take 1 mg by mouth 2 (two) times daily as needed (panic attacks).      multivitamin (ONE-A-DAY MEN'S) TABS tablet Take 1 tablet by mouth in the morning.     ondansetron  (ZOFRAN -ODT) 4 MG disintegrating  tablet Take 1 tablet (4 mg total) by mouth every 8 (eight) hours as needed for nausea or vomiting. 20 tablet 0   spironolactone  (ALDACTONE ) 25 MG tablet Take 1 tablet by mouth once daily 90 tablet 0   tacrolimus  (PROGRAF ) 1 MG capsule Take 1 mg by mouth 2 (two) times daily.     triamcinolone  cream (KENALOG) 0.1 % Apply 1 application  topically 2 (two) times daily as needed (skin irritation.).     vitamin B-12 (CYANOCOBALAMIN) 500 MCG tablet Take 500 mcg by mouth in the morning.     meloxicam (MOBIC) 15 MG tablet Take 15 mg by mouth daily. (Patient not taking: Reported on 01/07/2024)     sodium zirconium cyclosilicate  (LOKELMA ) 10 g PACK packet Take 10 g by mouth daily. (Patient not taking: Reported on 01/07/2024) 30 packet 3   No current facility-administered medications for this encounter.   Allergies  Allergen Reactions   Morphine  And Codeine Nausea And Vomiting   Social History   Socioeconomic History   Marital status: Widowed    Spouse name: Not on file   Number of  children: 1   Years of education: Not on file   Highest education level: Not on file  Occupational History   Not on file  Tobacco Use   Smoking status: Former    Current packs/day: 0.00    Types: Cigarettes    Quit date: 09/04/2007    Years since quitting: 16.3   Smokeless tobacco: Never  Vaping Use   Vaping status: Never Used  Substance and Sexual Activity   Alcohol  use: No   Drug use: No   Sexual activity: Yes    Birth control/protection: None  Other Topics Concern   Not on file  Social History Narrative   Not on file   Social Drivers of Health   Financial Resource Strain: Low Risk  (03/23/2022)   Received from [facility]   Overall Financial Resource Strain (CARDIA)    Difficulty of Paying Living Expenses: Not hard at all  Food Insecurity: No Food Insecurity (03/23/2022)   Received from [facility]   Hunger Vital Sign    Within the past 12 months, you worried that your food would run out before  you got the money to buy more.: Never true    Within the past 12 months, the food you bought just didn't last and you didn't have money to get more.: Never true  Transportation Needs: No Transportation Needs (03/23/2022)   Received from [facility]   PRAPARE - Transportation    Lack of Transportation (Medical): No    Lack of Transportation (Non-Medical): No  Physical Activity: Unknown (06/15/2022)   Received from [facility]   Exercise Vital Sign    On average, how many days per week do you engage in moderate to strenuous exercise (like a brisk walk)?: 7 days    Minutes of Exercise per Session: Not on file  Stress: No Stress Concern Present (03/23/2022)   Received from [facility] - Occupational Stress Questionnaire    Feeling of Stress : Not at all  Social Connections: Moderately Integrated (03/23/2022)   Received from [facility]   Social Connection and Isolation Panel    In a typical week, how many times do you talk on the phone with family, friends, or neighbors?: More than three times a week    How often do you get together with friends or relatives?: More than three times a week    How often do you attend church or religious services?: More than 4 times per year    Do you belong to any clubs or organizations such as church groups, unions, fraternal or athletic groups, or school groups?: Yes    How often do you attend meetings of the clubs or organizations you belong to?: More than 4 times per year    Are you married, widowed, divorced, separated, never married, or living with a partner?: Widowed  Intimate Partner Violence: Not At Risk (03/23/2022)   Received from [facility]   Humiliation, Afraid, Rape, and Kick questionnaire    Within the last year, have you been afraid of your partner or ex-partner?: No    Within the last year, have you been humiliated or emotionally abused in other ways by your partner or ex-partner?:  No    Within the last year, have you been kicked, hit, slapped, or otherwise physically hurt by your partner or ex-partner?: No    Within the last year, have you been raped or forced to have  any kind of sexual activity by your partner or ex-partner?: No   Family History  Problem Relation Age of Onset   Diabetes Mother    Heart disease Father    Heart disease Son    BP 126/72   Pulse (!) 56   Ht 5' 10 (1.778 m)   Wt 75.3 kg (166 lb)   SpO2 97%   BMI 23.82 kg/m   Wt Readings from Last 3 Encounters:  01/07/24 75.3 kg (166 lb)  06/04/23 75.2 kg (165 lb 12.8 oz)  05/25/23 75.8 kg (167 lb)   PHYSICAL EXAM: General:  NAD. No resp difficulty, walked into clinic HEENT: Normal Neck: Supple. No JVD. Cor: Regular rate & rhythm. No rubs, gallops or murmurs. Lungs: Clear Abdomen: Soft, nontender, nondistended.  Extremities: No cyanosis, clubbing, rash, edema Neuro: Alert & oriented x 3, moves all 4 extremities w/o difficulty. Affect pleasant.  ASSESSMENT & PLAN: 1. CAD: NSTEMI in 7/21.  Suspect out of hospital inferolateral MI.  Cardiac MRI showed that the inferolateral wall was likely not viable, but the remainder of the LV myocardium was likely viable. He had CABG 12/19/19 with LIMA-LAD, RIMA-ramus, seq SVG-PD/PL, SVG-D.  No chest pain.   - No ASA given Eliquis  use.  - Continue atorvastatin  80 mg daily.  Most recent LDL 90, goal LDL < 55. Refer to Pharmacy for [facility].  2. Chronic Systolic CHF: Ischemic cardiomyopathy.  Echo 7/21 with EF 20-25%, moderately decreased RV systolic function. Cardiac MRI with LV EF 26%, RV EF 38%, near full thickness scar in inferolateral wall suggesting lack of viability in the LCx territory.  Now post-CABG.  Echo in 10/21 showed EF 20-25%, moderate LV dilation, mild-moderately decreased RV systolic function, moderate MR.  Echo in 4/22 showed EF 30%, mid anterior and inferior severe hypokinesis, basal to mid anterolateral and inferolateral akinesis, mildly  decreased RV systolic function.  Echo at [facility] 12/23 with EF 20%, regional WMA in LV, mildly reduced RV. Echo 11/24 showed EF 25-30% with moderate LV dilation, akinesis of inferolateral/inferior/anterolateral walls, severe LAE, mild MR, normal RV size with mildly decreased systolic function. NYHA I-II, fatigue main symptom. He is not volume overloaded on exam. - He is not taking Lasix . - Continue Entresto  24/26 mg bid. BMET and BNP today. - Continue spironolactone  25 mg daily - Continue Coreg  12.5 mg bid.  - Continue Farxiga  10 mg daily.  - He has decided that he does not want an ICD, reiterates this today.   - repeat echo next visit. 3. Atrial fibrillation:  S/p Maze + LAA clipping on 7/16.  He is off amiodarone . Regular on exam - Continue Eliquis  5 mg bid. CBC today. 4. CKD: Stage 3.  He required HD after CABG but renal function recovered. He is followed by nephrology - Continue Farxiga . 5. Liver transplant for HCV: Followed at [facility]. On Tacrolimus .  6. Cocaine abuse: He has quit since 1/24.  7. Anemia of Chronic Disease: Previously on Procrit , now takes oral iron supplementation. - Check iron panel today.  Follow up in 3 months with Dr. Rolan + echo  Harlene HERO Learned, [place] 01/07/24

## 2024-01-04 ENCOUNTER — Telehealth (HOSPITAL_COMMUNITY): Payer: Self-pay

## 2024-01-04 ENCOUNTER — Encounter (HOSPITAL_COMMUNITY)

## 2024-01-04 NOTE — Telephone Encounter (Signed)
 Called to confirm/remind patient of their appointment at the Advanced Heart Failure Clinic on 01/07/24 2:00.   Appointment:   [] Confirmed  [x] Left mess   [] No answer/No voice mail  [] VM Full/unable to leave message  [] Phone not in service  Patient reminded to bring all medications and/or complete list.  Confirmed patient has transportation. Gave directions, instructed to utilize valet parking.

## 2024-01-07 ENCOUNTER — Encounter (HOSPITAL_COMMUNITY): Payer: Self-pay

## 2024-01-07 ENCOUNTER — Ambulatory Visit (HOSPITAL_COMMUNITY)
Admission: RE | Admit: 2024-01-07 | Discharge: 2024-01-07 | Disposition: A | Discharge status: Home / Self Care | Source: Ambulatory Visit | Attending: Family Medicine

## 2024-01-07 ENCOUNTER — Ambulatory Visit (HOSPITAL_COMMUNITY): Payer: Self-pay | Admitting: Family Medicine

## 2024-01-07 VITALS — BP 126/72 | HR 56 | Ht 70.0 in | Wt 166.0 lb

## 2024-01-07 DIAGNOSIS — Z944 Liver transplant status: Secondary | ICD-10-CM | POA: Diagnosis not present

## 2024-01-07 DIAGNOSIS — Z951 Presence of aortocoronary bypass graft: Secondary | ICD-10-CM | POA: Insufficient documentation

## 2024-01-07 DIAGNOSIS — D649 Anemia, unspecified: Secondary | ICD-10-CM

## 2024-01-07 DIAGNOSIS — Z79899 Other long term (current) drug therapy: Secondary | ICD-10-CM | POA: Diagnosis not present

## 2024-01-07 DIAGNOSIS — I5022 Chronic systolic (congestive) heart failure: Secondary | ICD-10-CM | POA: Insufficient documentation

## 2024-01-07 DIAGNOSIS — N183 Chronic kidney disease, stage 3 unspecified: Secondary | ICD-10-CM | POA: Insufficient documentation

## 2024-01-07 DIAGNOSIS — N1832 Chronic kidney disease, stage 3b: Secondary | ICD-10-CM | POA: Diagnosis not present

## 2024-01-07 DIAGNOSIS — Z7901 Long term (current) use of anticoagulants: Secondary | ICD-10-CM | POA: Insufficient documentation

## 2024-01-07 DIAGNOSIS — I252 Old myocardial infarction: Secondary | ICD-10-CM | POA: Diagnosis not present

## 2024-01-07 DIAGNOSIS — I5082 Biventricular heart failure: Secondary | ICD-10-CM | POA: Insufficient documentation

## 2024-01-07 DIAGNOSIS — F1411 Cocaine abuse, in remission: Secondary | ICD-10-CM | POA: Diagnosis not present

## 2024-01-07 DIAGNOSIS — D631 Anemia in chronic kidney disease: Secondary | ICD-10-CM | POA: Diagnosis not present

## 2024-01-07 DIAGNOSIS — E1122 Type 2 diabetes mellitus with diabetic chronic kidney disease: Secondary | ICD-10-CM | POA: Insufficient documentation

## 2024-01-07 DIAGNOSIS — Z7984 Long term (current) use of oral hypoglycemic drugs: Secondary | ICD-10-CM | POA: Diagnosis not present

## 2024-01-07 DIAGNOSIS — I130 Hypertensive heart and chronic kidney disease with heart failure and stage 1 through stage 4 chronic kidney disease, or unspecified chronic kidney disease: Secondary | ICD-10-CM | POA: Diagnosis not present

## 2024-01-07 DIAGNOSIS — I480 Paroxysmal atrial fibrillation: Secondary | ICD-10-CM | POA: Diagnosis not present

## 2024-01-07 DIAGNOSIS — L4050 Arthropathic psoriasis, unspecified: Secondary | ICD-10-CM | POA: Insufficient documentation

## 2024-01-07 DIAGNOSIS — I255 Ischemic cardiomyopathy: Secondary | ICD-10-CM | POA: Diagnosis not present

## 2024-01-07 DIAGNOSIS — I2510 Atherosclerotic heart disease of native coronary artery without angina pectoris: Secondary | ICD-10-CM | POA: Insufficient documentation

## 2024-01-07 LAB — BASIC METABOLIC PANEL WITH GFR
Anion gap: 11 (ref 5–15)
BUN: 25 mg/dL — ABNORMAL HIGH (ref 8–23)
CO2: 22 mmol/L (ref 22–32)
Calcium: 9.4 mg/dL (ref 8.9–10.3)
Chloride: 96 mmol/L — ABNORMAL LOW (ref 98–111)
Creatinine, Ser: 1.34 mg/dL — ABNORMAL HIGH (ref 0.61–1.24)
GFR, Estimated: 58 mL/min — ABNORMAL LOW (ref >60)
Glucose, Bld: 99 mg/dL (ref 70–99)
Potassium: 5.5 mmol/L — ABNORMAL HIGH (ref 3.5–5.1)
Sodium: 129 mmol/L — ABNORMAL LOW (ref 135–145)

## 2024-01-07 LAB — IRON AND TIBC
Iron: 63 ug/dL (ref 45–182)
Saturation Ratios: 23 % (ref 17.9–39.5)
TIBC: 272 ug/dL (ref 250–450)
UIBC: 209 ug/dL

## 2024-01-07 LAB — CBC
HCT: 37.7 % — ABNORMAL LOW (ref 39.0–52.0)
Hemoglobin: 12.5 g/dL — ABNORMAL LOW (ref 13.0–17.0)
MCH: 30 pg (ref 26.0–34.0)
MCHC: 33.2 g/dL (ref 30.0–36.0)
MCV: 90.6 fL (ref 80.0–100.0)
Platelets: 265 K/uL (ref 150–400)
RBC: 4.16 MIL/uL — ABNORMAL LOW (ref 4.22–5.81)
RDW: 13.7 % (ref 11.5–15.5)
WBC: 7.2 K/uL (ref 4.0–10.5)
nRBC: 0 % (ref 0.0–0.2)

## 2024-01-07 LAB — TSH: TSH: 2.907 u[IU]/mL (ref 0.350–4.500)

## 2024-01-07 LAB — FERRITIN: Ferritin: 168 ng/mL (ref 24–336)

## 2024-01-07 NOTE — Patient Instructions (Signed)
 Medication Changes:  No Changes In Medications at this time.   Lab Work:  Labs done today, your results will be available in MyChart, we will contact you for abnormal readings.  Referrals:  YOU HAVE BEEN REFERRED TO LIPID CLINIC (CHOLESTEROL CLINIC)  THEY WILL REACH OUT TO YOU OR CALL TO ARRANGE THIS. PLEASE CALL US  WITH ANY CONCERNS   Follow-Up in: 3 MONTHS WITH AN ECHO PLEASE CALL OUR OFFICE AROUND SEPTEMBER TO GET SCHEDULED FOR YOUR APPOINTMENT. PHONE NUMBER IS [phone] OPTION 2   At the Advanced Heart Failure Clinic, you and your health needs are our priority. We have a designated team specialized in the treatment of Heart Failure. This Care Team includes your primary Heart Failure Specialized Cardiologist (physician), Advanced Practice Providers (APPs- Physician Assistants and Nurse Practitioners), and Pharmacist who all work together to provide you with the care you need, when you need it.   You may see any of the following providers on your designated Care Team at your next follow up:  Dr. Toribio Fuel Dr. Ezra Shuck Dr. Ria Commander Dr. Odis Brownie Greig Mosses, NP Caffie Shed, [place] [facility], [place] Beckey Coe, NP [place] Lee, NP Tinnie Redman, PharmD   Please be sure to bring in all your medications bottles to every appointment.   Need to Contact Us :  If you have any questions or concerns before your next appointment please send us  a message through [facility] or call our office at [phone].    TO LEAVE A MESSAGE FOR THE NURSE SELECT OPTION 2, PLEASE LEAVE A MESSAGE INCLUDING: YOUR NAME DATE OF BIRTH CALL BACK NUMBER REASON FOR CALL**this is important as we prioritize the call backs  YOU WILL RECEIVE A CALL BACK THE SAME DAY AS LONG AS YOU CALL BEFORE 4:00 PM

## 2024-01-08 ENCOUNTER — Other Ambulatory Visit (HOSPITAL_COMMUNITY): Payer: Self-pay

## 2024-01-08 ENCOUNTER — Other Ambulatory Visit (HOSPITAL_COMMUNITY): Payer: Self-pay | Admitting: Cardiology

## 2024-01-08 MED ORDER — LOKELMA 10 G PO PACK
10.0000 g | PACK | Freq: Every day | ORAL | 3 refills | Status: AC
Start: 2024-01-08 — End: ?

## 2024-01-14 DIAGNOSIS — Z299 Encounter for prophylactic measures, unspecified: Secondary | ICD-10-CM | POA: Diagnosis not present

## 2024-01-14 DIAGNOSIS — I509 Heart failure, unspecified: Secondary | ICD-10-CM | POA: Diagnosis not present

## 2024-01-14 DIAGNOSIS — I10 Essential (primary) hypertension: Secondary | ICD-10-CM | POA: Diagnosis not present

## 2024-01-14 DIAGNOSIS — M79604 Pain in right leg: Secondary | ICD-10-CM | POA: Diagnosis not present

## 2024-01-14 DIAGNOSIS — R52 Pain, unspecified: Secondary | ICD-10-CM | POA: Diagnosis not present

## 2024-01-14 DIAGNOSIS — M79605 Pain in left leg: Secondary | ICD-10-CM | POA: Diagnosis not present

## 2024-01-18 DIAGNOSIS — Z299 Encounter for prophylactic measures, unspecified: Secondary | ICD-10-CM | POA: Diagnosis not present

## 2024-01-18 DIAGNOSIS — G2581 Restless legs syndrome: Secondary | ICD-10-CM | POA: Diagnosis not present

## 2024-01-18 DIAGNOSIS — I509 Heart failure, unspecified: Secondary | ICD-10-CM | POA: Diagnosis not present

## 2024-01-18 DIAGNOSIS — R52 Pain, unspecified: Secondary | ICD-10-CM | POA: Diagnosis not present

## 2024-01-18 DIAGNOSIS — I10 Essential (primary) hypertension: Secondary | ICD-10-CM | POA: Diagnosis not present

## 2024-01-28 DIAGNOSIS — M79606 Pain in leg, unspecified: Secondary | ICD-10-CM | POA: Diagnosis not present

## 2024-01-28 DIAGNOSIS — Z299 Encounter for prophylactic measures, unspecified: Secondary | ICD-10-CM | POA: Diagnosis not present

## 2024-01-28 DIAGNOSIS — R52 Pain, unspecified: Secondary | ICD-10-CM | POA: Diagnosis not present

## 2024-01-28 DIAGNOSIS — I5021 Acute systolic (congestive) heart failure: Secondary | ICD-10-CM | POA: Diagnosis not present

## 2024-01-28 DIAGNOSIS — N1832 Chronic kidney disease, stage 3b: Secondary | ICD-10-CM | POA: Diagnosis not present

## 2024-01-28 DIAGNOSIS — I10 Essential (primary) hypertension: Secondary | ICD-10-CM | POA: Diagnosis not present

## 2024-01-31 ENCOUNTER — Other Ambulatory Visit (HOSPITAL_COMMUNITY)
Admission: RE | Admit: 2024-01-31 | Discharge: 2024-01-31 | Disposition: A | Discharge status: Home / Self Care | Source: Ambulatory Visit | Attending: Family Medicine | Admitting: Family Medicine

## 2024-01-31 DIAGNOSIS — M79672 Pain in left foot: Secondary | ICD-10-CM | POA: Diagnosis not present

## 2024-01-31 DIAGNOSIS — M25561 Pain in right knee: Secondary | ICD-10-CM | POA: Diagnosis not present

## 2024-01-31 DIAGNOSIS — Z79899 Other long term (current) drug therapy: Secondary | ICD-10-CM | POA: Diagnosis not present

## 2024-01-31 DIAGNOSIS — L4050 Arthropathic psoriasis, unspecified: Secondary | ICD-10-CM | POA: Diagnosis not present

## 2024-01-31 DIAGNOSIS — I5022 Chronic systolic (congestive) heart failure: Secondary | ICD-10-CM | POA: Diagnosis not present

## 2024-01-31 DIAGNOSIS — M2550 Pain in unspecified joint: Secondary | ICD-10-CM | POA: Diagnosis not present

## 2024-01-31 DIAGNOSIS — N1831 Chronic kidney disease, stage 3a: Secondary | ICD-10-CM | POA: Diagnosis not present

## 2024-01-31 DIAGNOSIS — M25562 Pain in left knee: Secondary | ICD-10-CM | POA: Diagnosis not present

## 2024-01-31 DIAGNOSIS — M25439 Effusion, unspecified wrist: Secondary | ICD-10-CM | POA: Diagnosis not present

## 2024-01-31 DIAGNOSIS — L409 Psoriasis, unspecified: Secondary | ICD-10-CM | POA: Diagnosis not present

## 2024-01-31 DIAGNOSIS — M79641 Pain in right hand: Secondary | ICD-10-CM | POA: Diagnosis not present

## 2024-01-31 DIAGNOSIS — M549 Dorsalgia, unspecified: Secondary | ICD-10-CM | POA: Diagnosis not present

## 2024-01-31 DIAGNOSIS — M79671 Pain in right foot: Secondary | ICD-10-CM | POA: Diagnosis not present

## 2024-01-31 DIAGNOSIS — Z944 Liver transplant status: Secondary | ICD-10-CM | POA: Diagnosis not present

## 2024-01-31 DIAGNOSIS — M25569 Pain in unspecified knee: Secondary | ICD-10-CM | POA: Diagnosis not present

## 2024-01-31 DIAGNOSIS — M79642 Pain in left hand: Secondary | ICD-10-CM | POA: Diagnosis not present

## 2024-01-31 DIAGNOSIS — M79643 Pain in unspecified hand: Secondary | ICD-10-CM | POA: Diagnosis not present

## 2024-01-31 LAB — BASIC METABOLIC PANEL WITH GFR
Anion gap: 12 (ref 5–15)
BUN: 23 mg/dL (ref 8–23)
CO2: 22 mmol/L (ref 22–32)
Calcium: 9 mg/dL (ref 8.9–10.3)
Chloride: 95 mmol/L — ABNORMAL LOW (ref 98–111)
Creatinine, Ser: 1.13 mg/dL (ref 0.61–1.24)
GFR, Estimated: >60 mL/min (ref >60)
Glucose, Bld: 98 mg/dL (ref 70–99)
Potassium: 4.8 mmol/L (ref 3.5–5.1)
Sodium: 129 mmol/L — ABNORMAL LOW (ref 135–145)

## 2024-02-01 ENCOUNTER — Ambulatory Visit (HOSPITAL_COMMUNITY): Payer: Self-pay | Admitting: Family Medicine

## 2024-02-05 ENCOUNTER — Encounter: Payer: Self-pay | Admitting: Cardiology

## 2024-02-13 DIAGNOSIS — Z944 Liver transplant status: Secondary | ICD-10-CM | POA: Diagnosis not present

## 2024-02-14 ENCOUNTER — Other Ambulatory Visit (HOSPITAL_COMMUNITY): Payer: Self-pay | Admitting: Cardiology

## 2024-02-16 ENCOUNTER — Other Ambulatory Visit (HOSPITAL_COMMUNITY): Payer: Self-pay | Admitting: Cardiology

## 2024-02-18 DIAGNOSIS — M79606 Pain in leg, unspecified: Secondary | ICD-10-CM | POA: Diagnosis not present

## 2024-02-18 DIAGNOSIS — Z299 Encounter for prophylactic measures, unspecified: Secondary | ICD-10-CM | POA: Diagnosis not present

## 2024-02-18 DIAGNOSIS — I10 Essential (primary) hypertension: Secondary | ICD-10-CM | POA: Diagnosis not present

## 2024-02-18 DIAGNOSIS — N183 Chronic kidney disease, stage 3 unspecified: Secondary | ICD-10-CM | POA: Diagnosis not present

## 2024-02-18 DIAGNOSIS — I509 Heart failure, unspecified: Secondary | ICD-10-CM | POA: Diagnosis not present

## 2024-02-18 DIAGNOSIS — I482 Chronic atrial fibrillation, unspecified: Secondary | ICD-10-CM | POA: Diagnosis not present

## 2024-02-18 DIAGNOSIS — R52 Pain, unspecified: Secondary | ICD-10-CM | POA: Diagnosis not present

## 2024-02-20 ENCOUNTER — Other Ambulatory Visit (HOSPITAL_COMMUNITY)
Admission: RE | Admit: 2024-02-20 | Discharge: 2024-02-20 | Disposition: A | Discharge status: Home / Self Care | Source: Ambulatory Visit | Attending: Nephrology | Admitting: Nephrology

## 2024-02-20 DIAGNOSIS — E211 Secondary hyperparathyroidism, not elsewhere classified: Secondary | ICD-10-CM | POA: Insufficient documentation

## 2024-02-20 DIAGNOSIS — E1122 Type 2 diabetes mellitus with diabetic chronic kidney disease: Secondary | ICD-10-CM | POA: Insufficient documentation

## 2024-02-20 DIAGNOSIS — R809 Proteinuria, unspecified: Secondary | ICD-10-CM | POA: Diagnosis not present

## 2024-02-20 DIAGNOSIS — N189 Chronic kidney disease, unspecified: Secondary | ICD-10-CM | POA: Insufficient documentation

## 2024-02-20 DIAGNOSIS — D631 Anemia in chronic kidney disease: Secondary | ICD-10-CM | POA: Diagnosis not present

## 2024-02-20 DIAGNOSIS — E559 Vitamin D deficiency, unspecified: Secondary | ICD-10-CM | POA: Insufficient documentation

## 2024-02-20 DIAGNOSIS — I129 Hypertensive chronic kidney disease with stage 1 through stage 4 chronic kidney disease, or unspecified chronic kidney disease: Secondary | ICD-10-CM | POA: Insufficient documentation

## 2024-02-20 LAB — CBC
HCT: 35.5 % — ABNORMAL LOW (ref 39.0–52.0)
Hemoglobin: 11.7 g/dL — ABNORMAL LOW (ref 13.0–17.0)
MCH: 30 pg (ref 26.0–34.0)
MCHC: 33 g/dL (ref 30.0–36.0)
MCV: 91 fL (ref 80.0–100.0)
Platelets: 164 K/uL (ref 150–400)
RBC: 3.9 MIL/uL — ABNORMAL LOW (ref 4.22–5.81)
RDW: 14.3 % (ref 11.5–15.5)
WBC: 5 K/uL (ref 4.0–10.5)
nRBC: 0 % (ref 0.0–0.2)

## 2024-02-20 LAB — RENAL FUNCTION PANEL
Albumin: 3.6 g/dL (ref 3.5–5.0)
Anion gap: 10 (ref 5–15)
BUN: 25 mg/dL — ABNORMAL HIGH (ref 8–23)
CO2: 25 mmol/L (ref 22–32)
Calcium: 8.6 mg/dL — ABNORMAL LOW (ref 8.9–10.3)
Chloride: 94 mmol/L — ABNORMAL LOW (ref 98–111)
Creatinine, Ser: 1.29 mg/dL — ABNORMAL HIGH (ref 0.61–1.24)
GFR, Estimated: >60 mL/min (ref >60)
Glucose, Bld: 93 mg/dL (ref 70–99)
Phosphorus: 3.9 mg/dL (ref 2.5–4.6)
Potassium: 5.1 mmol/L (ref 3.5–5.1)
Sodium: 129 mmol/L — ABNORMAL LOW (ref 135–145)

## 2024-02-20 LAB — VITAMIN D 25 HYDROXY (VIT D DEFICIENCY, FRACTURES): Vit D, 25-Hydroxy: 65.45 ng/mL (ref 30–100)

## 2024-02-21 DIAGNOSIS — I5042 Chronic combined systolic (congestive) and diastolic (congestive) heart failure: Secondary | ICD-10-CM | POA: Diagnosis not present

## 2024-02-21 DIAGNOSIS — R809 Proteinuria, unspecified: Secondary | ICD-10-CM | POA: Diagnosis not present

## 2024-02-21 DIAGNOSIS — E871 Hypo-osmolality and hyponatremia: Secondary | ICD-10-CM | POA: Diagnosis not present

## 2024-02-21 DIAGNOSIS — N1831 Chronic kidney disease, stage 3a: Secondary | ICD-10-CM | POA: Diagnosis not present

## 2024-02-21 LAB — MICROALBUMIN / CREATININE URINE RATIO
Creatinine, Urine: 86.5 mg/dL
Microalb Creat Ratio: 720 mg/g{creat} — ABNORMAL HIGH (ref 0–29)
Microalb, Ur: 622.8 ug/mL — ABNORMAL HIGH

## 2024-02-21 LAB — PARATHYROID HORMONE, INTACT (NO CA): PTH: 19 pg/mL (ref 15–65)

## 2024-02-26 DIAGNOSIS — Z7189 Other specified counseling: Secondary | ICD-10-CM | POA: Diagnosis not present

## 2024-02-26 DIAGNOSIS — I10 Essential (primary) hypertension: Secondary | ICD-10-CM | POA: Diagnosis not present

## 2024-02-26 DIAGNOSIS — E7800 Pure hypercholesterolemia, unspecified: Secondary | ICD-10-CM | POA: Diagnosis not present

## 2024-02-26 DIAGNOSIS — Z1331 Encounter for screening for depression: Secondary | ICD-10-CM | POA: Diagnosis not present

## 2024-02-26 DIAGNOSIS — Z0000 Encounter for general adult medical examination without abnormal findings: Secondary | ICD-10-CM | POA: Diagnosis not present

## 2024-02-26 DIAGNOSIS — R5383 Other fatigue: Secondary | ICD-10-CM | POA: Diagnosis not present

## 2024-02-26 DIAGNOSIS — Z1339 Encounter for screening examination for other mental health and behavioral disorders: Secondary | ICD-10-CM | POA: Diagnosis not present

## 2024-02-26 DIAGNOSIS — F419 Anxiety disorder, unspecified: Secondary | ICD-10-CM | POA: Diagnosis not present

## 2024-02-26 DIAGNOSIS — Z299 Encounter for prophylactic measures, unspecified: Secondary | ICD-10-CM | POA: Diagnosis not present

## 2024-02-28 DIAGNOSIS — R5383 Other fatigue: Secondary | ICD-10-CM | POA: Diagnosis not present

## 2024-02-28 DIAGNOSIS — Z79899 Other long term (current) drug therapy: Secondary | ICD-10-CM | POA: Diagnosis not present

## 2024-02-28 DIAGNOSIS — Z125 Encounter for screening for malignant neoplasm of prostate: Secondary | ICD-10-CM | POA: Diagnosis not present

## 2024-02-28 DIAGNOSIS — R768 Other specified abnormal immunological findings in serum: Secondary | ICD-10-CM | POA: Diagnosis not present

## 2024-02-28 DIAGNOSIS — K7460 Unspecified cirrhosis of liver: Secondary | ICD-10-CM | POA: Diagnosis not present

## 2024-02-28 DIAGNOSIS — Z944 Liver transplant status: Secondary | ICD-10-CM | POA: Diagnosis not present

## 2024-02-28 DIAGNOSIS — F419 Anxiety disorder, unspecified: Secondary | ICD-10-CM | POA: Diagnosis not present

## 2024-02-28 DIAGNOSIS — Z5181 Encounter for therapeutic drug level monitoring: Secondary | ICD-10-CM | POA: Diagnosis not present

## 2024-02-28 DIAGNOSIS — E7800 Pure hypercholesterolemia, unspecified: Secondary | ICD-10-CM | POA: Diagnosis not present

## 2024-02-28 DIAGNOSIS — D849 Immunodeficiency, unspecified: Secondary | ICD-10-CM | POA: Diagnosis not present

## 2024-03-03 ENCOUNTER — Encounter (INDEPENDENT_AMBULATORY_CARE_PROVIDER_SITE_OTHER): Payer: Self-pay | Admitting: *Deleted

## 2024-03-03 DIAGNOSIS — N189 Chronic kidney disease, unspecified: Secondary | ICD-10-CM | POA: Diagnosis not present

## 2024-03-05 DIAGNOSIS — N1831 Chronic kidney disease, stage 3a: Secondary | ICD-10-CM | POA: Diagnosis not present

## 2024-03-05 DIAGNOSIS — E871 Hypo-osmolality and hyponatremia: Secondary | ICD-10-CM | POA: Diagnosis not present

## 2024-03-05 DIAGNOSIS — I130 Hypertensive heart and chronic kidney disease with heart failure and stage 1 through stage 4 chronic kidney disease, or unspecified chronic kidney disease: Secondary | ICD-10-CM | POA: Diagnosis not present

## 2024-03-05 DIAGNOSIS — R809 Proteinuria, unspecified: Secondary | ICD-10-CM | POA: Diagnosis not present

## 2024-03-10 DIAGNOSIS — L570 Actinic keratosis: Secondary | ICD-10-CM | POA: Diagnosis not present

## 2024-03-10 DIAGNOSIS — L400 Psoriasis vulgaris: Secondary | ICD-10-CM | POA: Diagnosis not present

## 2024-03-10 DIAGNOSIS — Z85828 Personal history of other malignant neoplasm of skin: Secondary | ICD-10-CM | POA: Diagnosis not present

## 2024-03-13 DIAGNOSIS — M25439 Effusion, unspecified wrist: Secondary | ICD-10-CM | POA: Diagnosis not present

## 2024-03-13 DIAGNOSIS — M2550 Pain in unspecified joint: Secondary | ICD-10-CM | POA: Diagnosis not present

## 2024-03-13 DIAGNOSIS — L4050 Arthropathic psoriasis, unspecified: Secondary | ICD-10-CM | POA: Diagnosis not present

## 2024-03-13 DIAGNOSIS — M25569 Pain in unspecified knee: Secondary | ICD-10-CM | POA: Diagnosis not present

## 2024-03-13 DIAGNOSIS — M1990 Unspecified osteoarthritis, unspecified site: Secondary | ICD-10-CM | POA: Diagnosis not present

## 2024-03-13 DIAGNOSIS — L409 Psoriasis, unspecified: Secondary | ICD-10-CM | POA: Diagnosis not present

## 2024-03-13 DIAGNOSIS — Z944 Liver transplant status: Secondary | ICD-10-CM | POA: Diagnosis not present

## 2024-03-13 DIAGNOSIS — I5022 Chronic systolic (congestive) heart failure: Secondary | ICD-10-CM | POA: Diagnosis not present

## 2024-03-13 DIAGNOSIS — M549 Dorsalgia, unspecified: Secondary | ICD-10-CM | POA: Diagnosis not present

## 2024-03-13 DIAGNOSIS — M79643 Pain in unspecified hand: Secondary | ICD-10-CM | POA: Diagnosis not present

## 2024-03-13 DIAGNOSIS — Z79899 Other long term (current) drug therapy: Secondary | ICD-10-CM | POA: Diagnosis not present

## 2024-03-13 DIAGNOSIS — N1831 Chronic kidney disease, stage 3a: Secondary | ICD-10-CM | POA: Diagnosis not present

## 2024-03-24 DIAGNOSIS — F32A Depression, unspecified: Secondary | ICD-10-CM | POA: Diagnosis not present

## 2024-03-24 DIAGNOSIS — G4700 Insomnia, unspecified: Secondary | ICD-10-CM | POA: Diagnosis not present

## 2024-03-24 DIAGNOSIS — E119 Type 2 diabetes mellitus without complications: Secondary | ICD-10-CM | POA: Diagnosis not present

## 2024-04-03 ENCOUNTER — Other Ambulatory Visit (HOSPITAL_COMMUNITY): Payer: Self-pay | Admitting: Cardiology

## 2024-04-24 DIAGNOSIS — R21 Rash and other nonspecific skin eruption: Secondary | ICD-10-CM | POA: Diagnosis not present

## 2024-04-24 DIAGNOSIS — R7681 Abnormal rheumatoid factor and anti-citrullinated protein antibody without rheumatoid arthritis: Secondary | ICD-10-CM | POA: Diagnosis not present

## 2024-04-24 DIAGNOSIS — Z1159 Encounter for screening for other viral diseases: Secondary | ICD-10-CM | POA: Diagnosis not present

## 2024-05-07 ENCOUNTER — Other Ambulatory Visit (HOSPITAL_COMMUNITY): Payer: Self-pay | Admitting: Cardiology

## 2024-05-08 DIAGNOSIS — N186 End stage renal disease: Secondary | ICD-10-CM | POA: Diagnosis not present

## 2024-05-10 ENCOUNTER — Other Ambulatory Visit (HOSPITAL_COMMUNITY): Payer: Self-pay | Admitting: Cardiology

## 2024-05-14 ENCOUNTER — Ambulatory Visit (HOSPITAL_COMMUNITY)

## 2024-05-14 ENCOUNTER — Ambulatory Visit (HOSPITAL_COMMUNITY): Admitting: Cardiology

## 2024-06-19 ENCOUNTER — Ambulatory Visit (HOSPITAL_COMMUNITY)

## 2024-06-19 ENCOUNTER — Ambulatory Visit (HOSPITAL_COMMUNITY): Admitting: Cardiology

## 2024-06-28 ENCOUNTER — Other Ambulatory Visit (HOSPITAL_COMMUNITY): Payer: Self-pay | Admitting: Cardiology

## 2024-07-29 ENCOUNTER — Ambulatory Visit (HOSPITAL_COMMUNITY): Payer: Self-pay | Admitting: Cardiology

## 2024-07-29 ENCOUNTER — Ambulatory Visit (HOSPITAL_COMMUNITY)
